# Patient Record
Sex: Male | Born: 1935 | Race: White | Hispanic: No | Marital: Married | Smoking: Never smoker
Health system: Southern US, Community
[De-identification: ages and names within clinical notes are randomized; demographics above are authoritative.]

## PROBLEM LIST (undated history)

## (undated) DIAGNOSIS — Z951 Presence of aortocoronary bypass graft: Secondary | ICD-10-CM

## (undated) DIAGNOSIS — I10 Essential (primary) hypertension: Secondary | ICD-10-CM

## (undated) DIAGNOSIS — K869 Disease of pancreas, unspecified: Secondary | ICD-10-CM

## (undated) DIAGNOSIS — Z87442 Personal history of urinary calculi: Secondary | ICD-10-CM

## (undated) DIAGNOSIS — I2510 Atherosclerotic heart disease of native coronary artery without angina pectoris: Secondary | ICD-10-CM

## (undated) DIAGNOSIS — Z972 Presence of dental prosthetic device (complete) (partial): Secondary | ICD-10-CM

## (undated) DIAGNOSIS — I739 Peripheral vascular disease, unspecified: Secondary | ICD-10-CM

## (undated) DIAGNOSIS — N209 Urinary calculus, unspecified: Secondary | ICD-10-CM

## (undated) DIAGNOSIS — M1990 Unspecified osteoarthritis, unspecified site: Secondary | ICD-10-CM

## (undated) DIAGNOSIS — Z9889 Other specified postprocedural states: Secondary | ICD-10-CM

## (undated) DIAGNOSIS — E119 Type 2 diabetes mellitus without complications: Secondary | ICD-10-CM

## (undated) DIAGNOSIS — Z8719 Personal history of other diseases of the digestive system: Secondary | ICD-10-CM

## (undated) DIAGNOSIS — D649 Anemia, unspecified: Secondary | ICD-10-CM

## (undated) DIAGNOSIS — E039 Hypothyroidism, unspecified: Secondary | ICD-10-CM

## (undated) DIAGNOSIS — N400 Enlarged prostate without lower urinary tract symptoms: Secondary | ICD-10-CM

## (undated) DIAGNOSIS — A938 Other specified arthropod-borne viral fevers: Secondary | ICD-10-CM

## (undated) DIAGNOSIS — N189 Chronic kidney disease, unspecified: Secondary | ICD-10-CM

## (undated) DIAGNOSIS — Z973 Presence of spectacles and contact lenses: Secondary | ICD-10-CM

## (undated) DIAGNOSIS — E785 Hyperlipidemia, unspecified: Secondary | ICD-10-CM

## (undated) HISTORY — DX: Presence of aortocoronary bypass graft: Z95.1

## (undated) HISTORY — DX: Hyperlipidemia, unspecified: E78.5

## (undated) HISTORY — DX: Atherosclerotic heart disease of native coronary artery without angina pectoris: I25.10

## (undated) HISTORY — PX: SHOULDER SURGERY: SHX246

## (undated) HISTORY — DX: Other specified arthropod-borne viral fevers: A93.8

## (undated) HISTORY — PX: HERNIA REPAIR: SHX51

## (undated) HISTORY — PX: INGUINAL HERNIA REPAIR: SHX194

## (undated) HISTORY — DX: Urinary calculus, unspecified: N20.9

## (undated) HISTORY — PX: CORONARY ARTERY BYPASS GRAFT: SHX141

## (undated) HISTORY — DX: Benign prostatic hyperplasia without lower urinary tract symptoms: N40.0

## (undated) HISTORY — DX: Hypothyroidism, unspecified: E03.9

## (undated) HISTORY — DX: Unspecified osteoarthritis, unspecified site: M19.90

## (undated) HISTORY — DX: Type 2 diabetes mellitus without complications: E11.9

## (undated) HISTORY — DX: Other specified postprocedural states: Z98.890

## (undated) HISTORY — PX: MULTIPLE TOOTH EXTRACTIONS: SHX2053

## (undated) HISTORY — DX: Essential (primary) hypertension: I10

## (undated) HISTORY — PX: CATARACT EXTRACTION: SUR2

## (undated) HISTORY — DX: Peripheral vascular disease, unspecified: I73.9

## (undated) HISTORY — DX: Personal history of other diseases of the digestive system: Z87.19

---

## 2001-11-04 ENCOUNTER — Encounter: Payer: Self-pay | Admitting: Cardiovascular Disease

## 2001-11-04 ENCOUNTER — Ambulatory Visit (HOSPITAL_COMMUNITY): Admission: RE | Admit: 2001-11-04 | Discharge: 2001-11-04 | Payer: Self-pay | Admitting: Cardiovascular Disease

## 2004-10-25 ENCOUNTER — Ambulatory Visit: Payer: Self-pay | Admitting: Internal Medicine

## 2005-04-01 ENCOUNTER — Ambulatory Visit (HOSPITAL_COMMUNITY): Admission: RE | Admit: 2005-04-01 | Discharge: 2005-04-01 | Payer: Self-pay | Admitting: Surgery

## 2005-08-13 ENCOUNTER — Ambulatory Visit: Payer: Self-pay | Admitting: Internal Medicine

## 2006-08-28 ENCOUNTER — Ambulatory Visit: Payer: Self-pay | Admitting: Internal Medicine

## 2007-03-17 ENCOUNTER — Ambulatory Visit: Payer: Self-pay | Admitting: Internal Medicine

## 2007-03-17 LAB — CONVERTED CEMR LAB
ALT: 30 units/L (ref 0–40)
AST: 24 units/L (ref 0–37)
Cholesterol: 122 mg/dL (ref 0–200)
HDL: 42.8 mg/dL (ref >39.0)
LDL Cholesterol: 62 mg/dL (ref 0–99)
Total CHOL/HDL Ratio: 2.9
Triglycerides: 88 mg/dL (ref 0–149)
VLDL: 18 mg/dL (ref 0–40)

## 2007-09-21 ENCOUNTER — Telehealth (INDEPENDENT_AMBULATORY_CARE_PROVIDER_SITE_OTHER): Payer: Self-pay | Admitting: *Deleted

## 2007-10-30 DIAGNOSIS — Z951 Presence of aortocoronary bypass graft: Secondary | ICD-10-CM | POA: Insufficient documentation

## 2007-10-30 DIAGNOSIS — Z9889 Other specified postprocedural states: Secondary | ICD-10-CM | POA: Insufficient documentation

## 2007-11-03 ENCOUNTER — Ambulatory Visit: Payer: Self-pay | Admitting: Internal Medicine

## 2007-11-03 DIAGNOSIS — I2510 Atherosclerotic heart disease of native coronary artery without angina pectoris: Secondary | ICD-10-CM | POA: Insufficient documentation

## 2007-11-03 DIAGNOSIS — I10 Essential (primary) hypertension: Secondary | ICD-10-CM | POA: Insufficient documentation

## 2007-11-03 DIAGNOSIS — E785 Hyperlipidemia, unspecified: Secondary | ICD-10-CM | POA: Insufficient documentation

## 2007-11-03 DIAGNOSIS — E782 Mixed hyperlipidemia: Secondary | ICD-10-CM | POA: Insufficient documentation

## 2007-11-09 ENCOUNTER — Ambulatory Visit: Payer: Self-pay | Admitting: Internal Medicine

## 2007-11-09 ENCOUNTER — Encounter (INDEPENDENT_AMBULATORY_CARE_PROVIDER_SITE_OTHER): Payer: Self-pay | Admitting: *Deleted

## 2007-11-27 ENCOUNTER — Encounter: Payer: Self-pay | Admitting: Internal Medicine

## 2008-01-07 ENCOUNTER — Ambulatory Visit: Payer: Self-pay | Admitting: Internal Medicine

## 2008-01-09 LAB — CONVERTED CEMR LAB: Hgb A1c MFr Bld: 6.5 % — ABNORMAL HIGH (ref 4.6–6.0)

## 2008-01-12 ENCOUNTER — Encounter (INDEPENDENT_AMBULATORY_CARE_PROVIDER_SITE_OTHER): Payer: Self-pay | Admitting: *Deleted

## 2008-01-13 ENCOUNTER — Ambulatory Visit: Payer: Self-pay | Admitting: Internal Medicine

## 2008-03-02 ENCOUNTER — Ambulatory Visit: Payer: Self-pay | Admitting: Internal Medicine

## 2008-03-02 LAB — CONVERTED CEMR LAB
BUN: 16 mg/dL (ref 6–23)
Creatinine, Ser: 1 mg/dL (ref 0.4–1.5)
Creatinine,U: 115.4 mg/dL
Hgb A1c MFr Bld: 6.4 % — ABNORMAL HIGH (ref 4.6–6.0)
Microalb Creat Ratio: 13 mg/g (ref 0.0–30.0)
Microalb, Ur: 1.5 mg/dL (ref 0.0–1.9)

## 2008-03-09 ENCOUNTER — Ambulatory Visit: Payer: Self-pay | Admitting: Internal Medicine

## 2008-04-28 ENCOUNTER — Encounter: Payer: Self-pay | Admitting: Internal Medicine

## 2008-07-01 ENCOUNTER — Encounter: Payer: Self-pay | Admitting: Internal Medicine

## 2008-07-04 ENCOUNTER — Ambulatory Visit: Payer: Self-pay | Admitting: Internal Medicine

## 2008-07-04 DIAGNOSIS — E1165 Type 2 diabetes mellitus with hyperglycemia: Secondary | ICD-10-CM

## 2008-07-04 DIAGNOSIS — E1151 Type 2 diabetes mellitus with diabetic peripheral angiopathy without gangrene: Secondary | ICD-10-CM | POA: Insufficient documentation

## 2008-07-04 LAB — CONVERTED CEMR LAB: Hgb A1c MFr Bld: 6.3 % — ABNORMAL HIGH (ref 4.6–6.0)

## 2008-07-08 ENCOUNTER — Ambulatory Visit: Payer: Self-pay | Admitting: Internal Medicine

## 2008-10-21 ENCOUNTER — Ambulatory Visit: Payer: Self-pay | Admitting: Cardiovascular Disease

## 2008-10-21 LAB — CONVERTED CEMR LAB
BUN: 21 mg/dL (ref 6–23)
Basophils Absolute: 0.1 10*3/uL (ref 0.0–0.1)
Basophils Relative: 1.2 % (ref 0.0–3.0)
CO2: 31 meq/L (ref 19–32)
Calcium: 9.2 mg/dL (ref 8.4–10.5)
Chloride: 104 meq/L (ref 96–112)
Creatinine, Ser: 0.7 mg/dL (ref 0.4–1.5)
Eosinophils Absolute: 0.4 10*3/uL (ref 0.0–0.7)
Eosinophils Relative: 8.2 % — ABNORMAL HIGH (ref 0.0–5.0)
GFR calc Af Amer: 143 mL/min
GFR calc non Af Amer: 118 mL/min
Glucose, Bld: 92 mg/dL (ref 70–99)
HCT: 38.4 % — ABNORMAL LOW (ref 39.0–52.0)
Hemoglobin: 13.3 g/dL (ref 13.0–17.0)
INR: 1 (ref 0.8–1.0)
Lymphocytes Relative: 42.9 % (ref 12.0–46.0)
MCHC: 34.5 g/dL (ref 30.0–36.0)
MCV: 90.6 fL (ref 78.0–100.0)
Monocytes Absolute: 0.5 10*3/uL (ref 0.1–1.0)
Monocytes Relative: 9.4 % (ref 3.0–12.0)
Neutro Abs: 2.1 10*3/uL (ref 1.4–7.7)
Neutrophils Relative %: 38.3 % — ABNORMAL LOW (ref 43.0–77.0)
Platelets: 175 10*3/uL (ref 150–400)
Potassium: 4.1 meq/L (ref 3.5–5.1)
Prothrombin Time: 11.6 s (ref 10.9–13.3)
RBC: 4.24 M/uL (ref 4.22–5.81)
RDW: 13.2 % (ref 11.5–14.6)
Sodium: 139 meq/L (ref 135–145)
WBC: 5.4 10*3/uL (ref 4.5–10.5)

## 2008-10-24 ENCOUNTER — Inpatient Hospital Stay (HOSPITAL_BASED_OUTPATIENT_CLINIC_OR_DEPARTMENT_OTHER): Admission: RE | Admit: 2008-10-24 | Discharge: 2008-10-24 | Payer: Self-pay | Admitting: Cardiology

## 2008-10-24 ENCOUNTER — Ambulatory Visit: Payer: Self-pay | Admitting: Cardiology

## 2008-11-07 ENCOUNTER — Ambulatory Visit: Payer: Self-pay | Admitting: Cardiovascular Disease

## 2008-12-22 ENCOUNTER — Encounter: Payer: Self-pay | Admitting: Internal Medicine

## 2008-12-30 ENCOUNTER — Ambulatory Visit: Payer: Self-pay | Admitting: Internal Medicine

## 2008-12-30 DIAGNOSIS — N400 Enlarged prostate without lower urinary tract symptoms: Secondary | ICD-10-CM | POA: Insufficient documentation

## 2008-12-30 LAB — CONVERTED CEMR LAB
Cholesterol, target level: 200 mg/dL
HDL goal, serum: 40 mg/dL
LDL Goal: 70 mg/dL

## 2009-01-25 ENCOUNTER — Ambulatory Visit: Payer: Self-pay | Admitting: Internal Medicine

## 2009-01-25 LAB — CONVERTED CEMR LAB
OCCULT 1: NEGATIVE
OCCULT 2: NEGATIVE
OCCULT 3: NEGATIVE

## 2009-01-26 ENCOUNTER — Encounter (INDEPENDENT_AMBULATORY_CARE_PROVIDER_SITE_OTHER): Payer: Self-pay | Admitting: *Deleted

## 2009-03-24 ENCOUNTER — Ambulatory Visit: Payer: Self-pay | Admitting: Internal Medicine

## 2009-04-02 LAB — CONVERTED CEMR LAB
ALT: 19 units/L (ref 0–53)
AST: 20 units/L (ref 0–37)
Albumin: 3.9 g/dL (ref 3.5–5.2)
Alkaline Phosphatase: 46 units/L (ref 39–117)
BUN: 20 mg/dL (ref 6–23)
Basophils Absolute: 0 10*3/uL (ref 0.0–0.1)
Basophils Relative: 0.3 % (ref 0.0–3.0)
Bilirubin, Direct: 0 mg/dL (ref 0.0–0.3)
Cholesterol: 114 mg/dL (ref 0–200)
Creatinine, Ser: 1 mg/dL (ref 0.4–1.5)
Creatinine,U: 104.9 mg/dL
Eosinophils Absolute: 0.5 10*3/uL (ref 0.0–0.7)
Eosinophils Relative: 9 % — ABNORMAL HIGH (ref 0.0–5.0)
HCT: 39.2 % (ref 39.0–52.0)
HDL: 40.7 mg/dL (ref >39.00)
Hemoglobin: 13.2 g/dL (ref 13.0–17.0)
Hgb A1c MFr Bld: 6.3 % (ref 4.6–6.5)
LDL Cholesterol: 62 mg/dL (ref 0–99)
Lymphocytes Relative: 39.7 % (ref 12.0–46.0)
Lymphs Abs: 2 10*3/uL (ref 0.7–4.0)
MCHC: 33.7 g/dL (ref 30.0–36.0)
MCV: 91.3 fL (ref 78.0–100.0)
Microalb Creat Ratio: 7.6 mg/g (ref 0.0–30.0)
Microalb, Ur: 0.8 mg/dL (ref 0.0–1.9)
Monocytes Absolute: 0.5 10*3/uL (ref 0.1–1.0)
Monocytes Relative: 9.3 % (ref 3.0–12.0)
Neutro Abs: 2 10*3/uL (ref 1.4–7.7)
Neutrophils Relative %: 41.7 % — ABNORMAL LOW (ref 43.0–77.0)
PSA: 1.06 ng/mL (ref 0.10–4.00)
Platelets: 167 10*3/uL (ref 150.0–400.0)
Potassium: 4.6 meq/L (ref 3.5–5.1)
RBC: 4.29 M/uL (ref 4.22–5.81)
RDW: 13.7 % (ref 11.5–14.6)
Total Bilirubin: 0.7 mg/dL (ref 0.3–1.2)
Total CHOL/HDL Ratio: 3
Total Protein: 6.4 g/dL (ref 6.0–8.3)
Triglycerides: 58 mg/dL (ref 0.0–149.0)
VLDL: 11.6 mg/dL (ref 0.0–40.0)
WBC: 5 10*3/uL (ref 4.5–10.5)

## 2009-04-04 ENCOUNTER — Encounter (INDEPENDENT_AMBULATORY_CARE_PROVIDER_SITE_OTHER): Payer: Self-pay | Admitting: *Deleted

## 2009-04-06 ENCOUNTER — Ambulatory Visit: Payer: Self-pay | Admitting: Internal Medicine

## 2009-05-02 DIAGNOSIS — I498 Other specified cardiac arrhythmias: Secondary | ICD-10-CM | POA: Insufficient documentation

## 2009-05-02 DIAGNOSIS — I739 Peripheral vascular disease, unspecified: Secondary | ICD-10-CM | POA: Insufficient documentation

## 2009-05-03 ENCOUNTER — Ambulatory Visit: Payer: Self-pay | Admitting: Cardiovascular Disease

## 2009-06-29 ENCOUNTER — Telehealth: Payer: Self-pay | Admitting: Internal Medicine

## 2009-10-02 ENCOUNTER — Ambulatory Visit: Payer: Self-pay | Admitting: Internal Medicine

## 2009-10-03 ENCOUNTER — Encounter (INDEPENDENT_AMBULATORY_CARE_PROVIDER_SITE_OTHER): Payer: Self-pay | Admitting: *Deleted

## 2009-10-03 LAB — CONVERTED CEMR LAB: Hgb A1c MFr Bld: 6.3 % (ref 4.6–6.5)

## 2009-10-09 ENCOUNTER — Ambulatory Visit: Payer: Self-pay | Admitting: Internal Medicine

## 2009-10-26 ENCOUNTER — Ambulatory Visit: Payer: Self-pay | Admitting: Cardiovascular Disease

## 2010-01-02 ENCOUNTER — Encounter: Payer: Self-pay | Admitting: Internal Medicine

## 2010-04-10 ENCOUNTER — Telehealth (INDEPENDENT_AMBULATORY_CARE_PROVIDER_SITE_OTHER): Payer: Self-pay | Admitting: *Deleted

## 2010-05-02 ENCOUNTER — Ambulatory Visit: Payer: Self-pay | Admitting: Internal Medicine

## 2010-05-07 LAB — CONVERTED CEMR LAB
ALT: 26 units/L (ref 0–53)
AST: 24 units/L (ref 0–37)
Albumin: 4.3 g/dL (ref 3.5–5.2)
Alkaline Phosphatase: 48 units/L (ref 39–117)
BUN: 16 mg/dL (ref 6–23)
Bilirubin, Direct: 0.1 mg/dL (ref 0.0–0.3)
Cholesterol: 117 mg/dL (ref 0–200)
Creatinine, Ser: 1 mg/dL (ref 0.4–1.5)
Creatinine,U: 108.6 mg/dL
HDL: 43.4 mg/dL (ref >39.00)
Hgb A1c MFr Bld: 6.3 % (ref 4.6–6.5)
LDL Cholesterol: 65 mg/dL (ref 0–99)
Microalb Creat Ratio: 0.6 mg/g (ref 0.0–30.0)
Microalb, Ur: 0.6 mg/dL (ref 0.0–1.9)
PSA: 1.19 ng/mL (ref 0.10–4.00)
Potassium: 4.7 meq/L (ref 3.5–5.1)
Total Bilirubin: 0.7 mg/dL (ref 0.3–1.2)
Total CHOL/HDL Ratio: 3
Total Protein: 6.5 g/dL (ref 6.0–8.3)
Triglycerides: 45 mg/dL (ref 0.0–149.0)
VLDL: 9 mg/dL (ref 0.0–40.0)

## 2010-05-10 ENCOUNTER — Ambulatory Visit: Payer: Self-pay | Admitting: Internal Medicine

## 2010-05-10 DIAGNOSIS — M25519 Pain in unspecified shoulder: Secondary | ICD-10-CM | POA: Insufficient documentation

## 2010-08-10 ENCOUNTER — Ambulatory Visit: Payer: Self-pay | Admitting: Internal Medicine

## 2010-08-13 LAB — CONVERTED CEMR LAB: Hgb A1c MFr Bld: 7 % — ABNORMAL HIGH (ref 4.6–6.5)

## 2010-09-23 ENCOUNTER — Encounter: Payer: Self-pay | Admitting: Internal Medicine

## 2010-10-03 ENCOUNTER — Telehealth: Payer: Self-pay | Admitting: Internal Medicine

## 2010-10-03 DIAGNOSIS — K7689 Other specified diseases of liver: Secondary | ICD-10-CM | POA: Insufficient documentation

## 2010-10-05 ENCOUNTER — Encounter: Payer: Self-pay | Admitting: Internal Medicine

## 2010-10-08 ENCOUNTER — Encounter: Admission: RE | Admit: 2010-10-08 | Discharge: 2010-10-08 | Payer: Self-pay | Admitting: Internal Medicine

## 2010-10-29 ENCOUNTER — Ambulatory Visit: Payer: Self-pay | Admitting: Cardiovascular Disease

## 2010-12-16 DIAGNOSIS — A938 Other specified arthropod-borne viral fevers: Secondary | ICD-10-CM

## 2010-12-16 HISTORY — DX: Other specified arthropod-borne viral fevers: A93.8

## 2011-01-13 LAB — CONVERTED CEMR LAB
ALT: 21 units/L (ref 0–53)
AST: 20 units/L (ref 0–37)
Albumin: 4 g/dL (ref 3.5–5.2)
Alkaline Phosphatase: 55 units/L (ref 39–117)
BUN: 11 mg/dL (ref 6–23)
Basophils Absolute: 0.1 10*3/uL (ref 0.0–0.1)
Basophils Relative: 1 % (ref 0.0–1.0)
Bilirubin, Direct: 0.1 mg/dL (ref 0.0–0.3)
CO2: 29 meq/L (ref 19–32)
Calcium: 9.3 mg/dL (ref 8.4–10.5)
Chloride: 103 meq/L (ref 96–112)
Cholesterol: 113 mg/dL (ref 0–200)
Creatinine, Ser: 1 mg/dL (ref 0.4–1.5)
Eosinophils Absolute: 0.4 10*3/uL (ref 0.0–0.6)
Eosinophils Relative: 5.9 % — ABNORMAL HIGH (ref 0.0–5.0)
GFR calc Af Amer: 95 mL/min
GFR calc non Af Amer: 78 mL/min
Glucose, Bld: 108 mg/dL — ABNORMAL HIGH (ref 70–99)
HCT: 41.2 % (ref 39.0–52.0)
HDL: 40 mg/dL (ref >39.0)
Hemoglobin: 13.9 g/dL (ref 13.0–17.0)
Hgb A1c MFr Bld: 6.5 % — ABNORMAL HIGH (ref 4.6–6.0)
LDL Cholesterol: 55 mg/dL (ref 0–99)
Lymphocytes Relative: 28.7 % (ref 12.0–46.0)
MCHC: 33.8 g/dL (ref 30.0–36.0)
MCV: 91 fL (ref 78.0–100.0)
Monocytes Absolute: 0.7 10*3/uL (ref 0.2–0.7)
Monocytes Relative: 9.1 % (ref 3.0–11.0)
Neutro Abs: 4.1 10*3/uL (ref 1.4–7.7)
Neutrophils Relative %: 55.3 % (ref 43.0–77.0)
PSA: 1.13 ng/mL (ref 0.10–4.00)
Platelets: 264 10*3/uL (ref 150–400)
Potassium: 4.3 meq/L (ref 3.5–5.1)
RBC: 4.53 M/uL (ref 4.22–5.81)
RDW: 13 % (ref 11.5–14.6)
Sodium: 140 meq/L (ref 135–145)
TSH: 3.18 microintl units/mL (ref 0.35–5.50)
Total Bilirubin: 0.7 mg/dL (ref 0.3–1.2)
Total CHOL/HDL Ratio: 2.8
Total Protein: 7 g/dL (ref 6.0–8.3)
Triglycerides: 89 mg/dL (ref 0–149)
VLDL: 18 mg/dL (ref 0–40)
WBC: 7.4 10*3/uL (ref 4.5–10.5)

## 2011-01-17 NOTE — Letter (Signed)
Summary: Results Follow-up Letter  [facility]   [place], [place] [address]   Phone: [phone]  Fax: [phone]    04/04/2009        Ethan Mcneil. [place], [place]  [address]  Dear Ethan Mcneil,   The following are the results of your recent test(s):  Test     Result     Pap Smear    Normal_______  Not Normal_____       Comments: _________________________________________________________ Cholesterol LDL(Bad cholesterol):          Your goal is less than:         HDL (Good cholesterol):        Your goal is more than: _________________________________________________________ Other Tests:   _________________________________________________________  Please call for an appointment Or ___Please see attached labwork.______________________________________________________ _________________________________________________________ _________________________________________________________  Sincerely,  Carley Hammed  at [facility]

## 2011-01-17 NOTE — Assessment & Plan Note (Signed)
Summary: f59m/dm   CC:  6 month follow up.  History of Present Illness: Ethan Mcneil is seen today in followup for his hypertension hypercholesterolemia and coronary artery disease.  Her current bypass surgery in 1997.  His last heart catheterization in November of 2009 showed pristine grafts.  Not having chest pain.  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  he was nice enough to bring me a picture of him and his old tracing days.  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 he is not having any chest pain PND orthopnea palpitations or syncope.  I reviewed all of his lab work from April.  His LDL cholesterol is only 64.  LFTs were normal.  His hemoglobin A1c was 6.3.  He does try to watch his diet.  His fasting sugars tend to run 120 to 1:30.  I asked him to ask Dr. Lenna Gilford about the  possibility of adding Januvia to his regimen.  Current Problems (verified): 1)  Bradycardia  (ICD-427.89) 2)  Chest Pain-unspecified  (ICD-786.50) 3)  Pvd  (ICD-443.9) 4)  Hyperplasia Prostate Uns w/o Ur Obst & Oth Luts  (ICD-600.90) 5)  Diabetes Mellitus, Type II  (ICD-250.00) 6)  Hypertension, Essential Nos  (ICD-401.9) 7)  Hyperlipidemia  (ICD-272.2) 8)  C A D  (ICD-414.00) 9)  Herniorrhaphy, Hx of  (ICD-V45.89) 10)  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 1/2 Tab Two Times A Day 2)  Metformin Hcl 500 Mg  Tb24 (Metformin Hcl) .Marland Kitchen.. 1 Two Times A Day With 2 Largest Meals 3)  Glimepiride 2 Mg  Tabs (Glimepiride) .... 1/2 By Mouth Once Daily 4)  Simvastatin 40 Mg Tabs (Simvastatin) .Marland Kitchen.. 1 At Bedtime 5)  Multivitamins   Tabs (Multiple Vitamin) .Marland Kitchen.. 1 Tab By Mouth Once Daily 6)  Aspirin 81 Mg  Tabs (Aspirin) .Marland Kitchen.. 1 Tab By Mouth Two Times A Day  Allergies (verified): 1)  ! Codeine  Past History:  Past Medical History:    CHEST  PAIN-UNSPECIFIED (ICD-786.50)    PVD (ICD-443.9)    HYPERPLASIA PROSTATE UNS W/O UR OBST & OTH LUTS (ICD-600.90)    DIABETES MELLITUS, TYPE II (ICD-250.00)    HYPERTENSION, ESSENTIAL NOS (ICD-401.9)    HYPERLIPIDEMIA (ICD-272.2)    C A D (ICD-414.00) CABG 27' grafts patent cath [facility] 10/2008    HERNIORRHAPHY, HX OF (ICD-V45.89)    POSTSURGICAL AORTOCORONARY BYPASS STATUS (ICD-V45.81)      (05/02/2009)  Past Surgical History:    Coronary artery bypass graft x7 vessels 1997 ; cath 2002; cath 2009 : grafts patent    Shoulder surgery    Colonoscopy 2002    4/06-Inguinal herniorrhaphy     (04/06/2009)  Family History:    Father: lung CA    Mother:CAD/ stent    Siblings: bro DM     (12/30/2008)  Social History:    low fat    Occupation:Mechanic    Never Smoked    Alcohol use-no    Regular exercise-no but walks    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 All other systems reviewed and negative  Vital Signs:  Patient profile:   77  year old male Height:      70 inches Weight:      191 pounds BMI:     27.50 Pulse rate:   57 / minute Resp:     14 per minute BP sitting:   134 / 74  (left arm)  Vitals Entered By: Ethan Mcneil (May 03, 2009 3:06 PM)   Impression & Recommendations:  Problem # 1:  C A D (ICD-414.00) CABG 1997 Patent grafts cath 10/2008.  Medical therapy His updated medication list for this problem includes:    Metoprolol Tartrate 50 Mg Tabs (Metoprolol tartrate) .Marland Kitchen... 1/2 tab two times a day    Aspirin 81 Mg Tabs (Aspirin) .Marland Kitchen... 1 tab by mouth two times a day  Problem # 2:  HYPERTENSION, ESSENTIAL NOS (ICD-401.9) Well controlled low sodium diet His updated medication list for this problem includes:    Metoprolol  Tartrate 50 Mg Tabs (Metoprolol tartrate) .Marland Kitchen... 1/2 tab two times a day    Aspirin 81 Mg Tabs (Aspirin) .Marland Kitchen... 1 tab by mouth two times a day  Problem # 3:  DIABETES MELLITUS, TYPE II (ICD-250.00) Fairly good control, not overweight.  Consider adding Januvia if target HbA1c is < 6.0 His updated medication list for this problem includes:    Metformin Hcl 500 Mg Tb24 (Metformin hcl) .Marland Kitchen... 1 two times a day with 2 largest meals    Glimepiride 2 Mg Tabs (Glimepiride) .Marland Kitchen... 1/2 by mouth once daily    Aspirin 81 Mg Tabs (Aspirin) .Marland Kitchen... 1 tab by mouth two times a day  Problem # 4:  HYPERLIPIDEMIA (ICD-272.2)  LDL at goal with no side effects His updated medication list for this problem includes:    Simvastatin 40 Mg Tabs (Simvastatin) .Marland Kitchen... 1 at bedtime  CHOL: 114 (03/24/2009)   LDL: 62 (03/24/2009)   HDL: 40.70 (03/24/2009)   TG: 58.0 (03/24/2009) CHOL (goal): 200 (12/30/2008)   LDL (goal): 70 (12/30/2008)   HDL (goal): 40 (12/30/2008)   TG (goal): 150 (12/30/2008)  Patient Instructions: 1)  F/U Dr. Johnsie Cancel 6 months

## 2011-01-17 NOTE — Progress Notes (Signed)
Summary: POTENTIAL MEDICATION NONCOMPLIANCE  Phone Note Refill Request Message from:  Pharmacy on [employer]  Refills Requested: Medication #1:  GLIMEPIRIDE 2 MG  TABS 1/2 by mouth once daily OUR AVAILABLE PRESCRIPTION PRESCRIPTION CLAIMS SUGGUEST THAT YOUR PATIENT MAY POTENTIALLY BE NONCOMPLIANT WITH GLIMEPIRIDE 2MG . A 45 DAY SUPPLY WAS LAST FILLED ON 04/12/2009.  Initial call taken by: Silva Bandy,  June 29, 2009 2:37 PM  Follow-up for Phone Call        noted Follow-up by: Unice Cobble MD,  June 30, 2009 11:40 AM

## 2011-01-17 NOTE — Letter (Signed)
Summary: [name]  [name]   Imported By: Velora Heckler 12/07/2007 15:01:39  _____________________________________________________________________  External Attachment:    Type:   Image     Comment:   External Document

## 2011-01-17 NOTE — Progress Notes (Signed)
Summary: Refill Request  Phone Note Refill Request Call back at [phone] Message from:  Pharmacy on April 10, 2010 11:11 AM  Refills Requested: Medication #1:  SIMVASTATIN 40 MG TABS 1 at bedtime   Dosage confirmed as above?Dosage Confirmed   Supply Requested: 3 months   Notes: 3 refills [facility]  Next Appointment Scheduled: 5.26.11 Initial call taken by: Elna Breslow,  April 10, 2010 11:11 AM    New/Updated Medications: SIMVASTATIN 40 MG TABS (SIMVASTATIN) 1 at bedtime Prescriptions: SIMVASTATIN 40 MG TABS (SIMVASTATIN) 1 at bedtime  #90 x 0   Entered by:   Georgette Dover   Authorized by:   Unice Cobble MD   Signed by:   Georgette Dover on 04/10/2010   Method used:   Faxed to ...       [facility] (mail-order)             ,          Ph: [phone]       Fax: [phone]   RxID:   [id number]

## 2011-01-17 NOTE — Assessment & Plan Note (Signed)
Summary: roa review lab.cbs   Vital Signs:  Patient Profile:   77 Years Old Male Weight:      198.6 pounds Pulse rate:   64 / minute Resp:     16 per minute BP sitting:   128 / 70  (left arm) Cuff size:   large  Pt. in pain?   no  Vitals Entered By: Georgette Dover (January 13, 2008 10:21 AM)                  Chief Complaint:  REVIEW LABS-PATIENT GIVEN COPY and Type 2 diabetes mellitus follow-up.  History of Present Illness:  Type 2 Diabetes Mellitus Follow-Up      This is a 77 year old man who presents for Type 2 diabetes mellitus follow-up.  On low carb diet , "mine". Decreased sweets with 5 # loss. Bro has DM,  not overweight.  The patient reports weight loss, but denies polyuria, polydipsia, blurred vision, self managed hypoglycemia, hypoglycemia requiring help, weight gain, and numbness of extremities.  The patient denies the following symptoms: neuropathic pain, chest pain, vomiting, orthostatic symptoms, poor wound healing, intermittent claudication, vision loss, and foot ulcer.  Since the last visit the patient reports good dietary compliance, compliance with medications, exercising regularly, and not monitoring blood glucose.  Since the last visit, the patient reports having had no eye care and no foot care.    Current Allergies: ! CODEINE      Physical Exam  General:     Well-developed,well-nourished,in no acute distress; alert,appropriate and cooperative throughout examination Heart:     Split S1 vs S4;normal rate and regular rhythm.   Pulses:     R and L carotid,radial and posterior tibial pulses are full and equal bilaterally. Decreased DPP. Extremities:     trace left pedal edema.  Excellent nail health Skin:     Intact without suspicious lesions or rashes    Impression & Recommendations:  Problem # 1:  DIABETES MELLITUS, TYPE II, UNCONTROLLED (ICD-250.02)  His updated medication list for this problem includes:    Metformin Hcl 500 Mg Tb24 (Metformin  hcl) .Marland Kitchen... 1 two times a day with 2 largest meals    Glimepiride 2 Mg Tabs (Glimepiride) .Marland Kitchen... 1/2 qd   Problem # 2:  C A D (ICD-414.00)  His updated medication list for this problem includes:    Metoprolol Tartrate 50 Mg Tabs (Metoprolol tartrate) .Marland Kitchen... 1/2 tab bid   Complete Medication List: 1)  Metoprolol Tartrate 50 Mg Tabs (Metoprolol tartrate) .... 1/2 tab bid 2)  Vytorin 10-20 Mg Tabs (Ezetimibe-simvastatin) .Marland Kitchen.. 1 by mouth qd 3)  Metformin Hcl 500 Mg Tb24 (Metformin hcl) .Marland Kitchen.. 1 two times a day with 2 largest meals 4)  Glimepiride 2 Mg Tabs (Glimepiride) .... 1/2 qd   Patient Instructions: 1)  Please schedule a follow-up appointment in 2 months. 2)  BUN,creat, K+ prior to visit, ICD-9: 995.20 3)  HbgA1C prior to visit, ICD-9: 250.02 4)  Urine Microalbumin prior to visit, ICD-9:250.02. Continue low carb diet, NOT ATKIN'S. See GOAL SHEET    Prescriptions: GLIMEPIRIDE 2 MG  TABS (GLIMEPIRIDE) 1/2 qd  #90 x 0   Entered and Authorized by:   Unice Cobble MD   Signed by:   Unice Cobble MD on 01/13/2008   Method used:   Print then Give to Patient   RxID:   [id number] METFORMIN HCL 500 MG  TB24 (METFORMIN HCL) 1 two times a day with 2 largest meals  #180 x  1   Entered and Authorized by:   Unice Cobble MD   Signed by:   Unice Cobble MD on 01/13/2008   Method used:   Print then Give to Patient   RxID:   [id number]  ]

## 2011-01-17 NOTE — Assessment & Plan Note (Signed)
Summary: roa 2 months review lab.cbs   Vital Signs:  Patient Profile:   77 Years Old Male Weight:      196 pounds Pulse rate:   60 / minute Pulse rhythm:   regular Resp:     14 per minute BP sitting:   120 / 72  (left arm) Cuff size:   large  Pt. in pain?   no  Vitals Entered By: Janelle Floor (March 09, 2008 10:34 AM)                  Chief Complaint:  follow up labs and Type 2 diabetes mellitus follow-up.  History of Present Illness:  Type 2 Diabetes Mellitus Follow-Up      This is a 77 year old man who presents for Type 2 diabetes mellitus follow-up.  FBS 107-116; pc< 183. Wt loss of 8# "off sweets".  The patient reports weight loss, but denies polyuria, polydipsia, blurred vision, self managed hypoglycemia, hypoglycemia requiring help, weight gain, and numbness of extremities.  Since the last visit the patient reports good dietary compliance, compliance with medications, exercising regularly, and monitoring blood glucose.  The patient has been measuring capillary blood glucose before breakfast and after dinner.  Since the last visit, the patient reports having had no eye care and no foot care.  Complications from diabetes include ASCVD.  Last Cardiology evaluation 2004.     Current Allergies (reviewed today): ! CODEINE  Past Medical History:    Reviewed history from 11/03/2007 and no changes required:       Hyperlipidemia       Hypertension       Current Problems:        DIABETES MELLITUS, TYPE II, UNCONTROLLED (ICD-250.02)       OTHER ABNORMAL BLOOD CHEMISTRY (ICD-790.6)       HYPERPLASIA PROSTATE UNS W/O UR OBST & OTH LUTS (ICD-600.90)       HYPERTENSION, ESSENTIAL NOS (ICD-401.9)       HYPERLIPIDEMIA (ICD-272.2)       C A D (ICD-414.00)       HYPERTENSION (ICD-401.9)       HYPERLIPIDEMIA (ICD-272.4)       ROUTINE GENERAL MEDICAL EXAM@HEALTH  CARE FACL (ICD-V70.0)       HERNIORRHAPHY, HX OF (ICD-V45.89)       POSTSURGICAL AORTOCORONARY BYPASS STATUS  (ICD-V45.81)       CORONARY ATHEROSCLEROSIS NATIVE CORONARY ARTERY (ICD-414.01)  Past Surgical History:    Reviewed history from 11/03/2007 and no changes required:       Coronary artery bypass graft x7 vessels 1997 ; cath 2002       Shoulder surgery       Colonoscopy 2002       Inguinal herniorrhaphy   Family History:    Reviewed history from 11/03/2007 and no changes required:       Father: lung CA       Mother: stent       Siblings: bro DM  Social History:    Reviewed history from 11/03/2007 and no changes required:       low fat     Physical Exam  General:     Well-developed,well-nourished,in no acute distress; alert,appropriate and cooperative throughout examination; having some classic LBS symptoms Lungs:     Normal respiratory effort, chest expands symmetrically. Lungs are clear to auscultation, no crackles or wheezes. Heart:     Normal rate and regular rhythm. S1 and S2 normal without gallop, murmur, click, rub. S4 with  slurring. Abdomen:     Bowel sounds positive,abdomen soft and non-tender without masses, organomegaly or hernias noted. Msk:     Classic LBS gait, mobilization. Pulses:     R and L carotid,radial,femoral  pulses are full and equal bilaterally. Decreased pedal pulses. Extremities:     Neg SLR    Impression & Recommendations:  Problem # 1:  DIAB W/O COMP TYPE II/UNS NOT STATED UNCNTRL (ICD-250.00)  His updated medication list for this problem includes:    Metformin Hcl 500 Mg Tb24 (Metformin hcl) .Marland Kitchen... 1 two times a day with 2 largest meals    Glimepiride 2 Mg Tabs (Glimepiride) .Marland Kitchen... 1/2 qd  Orders: LE Arterial Doppler/ABI (LE arterial doppler)   Problem # 2:  PERIPHERAL VASCULAR INSUFFICIENCY,  LEGS, BILATERAL (ICD-443.9)  Orders: LE Arterial Doppler/ABI (LE arterial doppler)   Complete Medication List: 1)  Metoprolol Tartrate 50 Mg Tabs (Metoprolol tartrate) .... 1/2 tab bid 2)  Vytorin 10-20 Mg Tabs (Ezetimibe-simvastatin) .Marland Kitchen..  1 by mouth qd 3)  Metformin Hcl 500 Mg Tb24 (Metformin hcl) .Marland Kitchen.. 1 two times a day with 2 largest meals 4)  Glimepiride 2 Mg Tabs (Glimepiride) .... 1/2 qd   Patient Instructions: 1)  Check your blood sugars regularly. If your readings are usually above :130 or below 70 you should contact our office. 2)  See your eye doctor yearly to check for diabetic eye damage. 3)  Check your feet each night for sore areas, calluses or signs of infection. 4)  Please schedule a follow-up appointment in 4 months. 5)  HbgA1C prior to visit, ICD-9:2250.00    Prescriptions: VYTORIN 10-20 MG  TABS (EZETIMIBE-SIMVASTATIN) 1 by mouth qd  #90 x 3   Entered and Authorized by:   Unice Cobble MD   Signed by:   Unice Cobble MD on 03/09/2008   Method used:   Print then Give to Patient   RxID:   [id number]  ]

## 2011-01-17 NOTE — Letter (Signed)
Summary: Results Follow up Letter  [facility]   [place], [place] [address]   Phone: [phone]  Fax: [phone]    10/03/2009 MRN: [id number]  [facility] [address] [place], [place]  [address]  Dear Mr. FEARRINGTON,  The following are the results of your recent test(s):  Test         Result    Pap Smear:        Normal _____  Not Normal _____ Comments: ______________________________________________________ Cholesterol: LDL(Bad cholesterol):         Your goal is less than:         HDL (Good cholesterol):       Your goal is more than: Comments:  ______________________________________________________ Mammogram:        Normal _____  Not Normal _____ Comments:  ___________________________________________________________________ Hemoccult:        Normal _____  Not normal _______ Comments:    _____________________________________________________________________ Other Tests: Please see attached labs done on 10/02/2009    We routinely do not discuss normal results over the telephone.  If you desire a copy of the results, or you have any questions about this information we can discuss them at your next office visit.   Sincerely,

## 2011-01-17 NOTE — Progress Notes (Signed)
     New Problems: HEPATIC CYST (ICD-573.8)   New Problems: HEPATIC CYST (ICD-573.8)

## 2011-01-17 NOTE — Assessment & Plan Note (Signed)
Summary: 6 month//fd   Vital Signs:  Patient profile:   77 year old male Weight:      188.65 pounds Pulse rate:   58 / minute Resp:     16 per minute BP sitting:   120 / 68  (left arm) Cuff size:   large  Vitals Entered By: Georgette Dover (October 09, 2009 8:07 AM) CC: 6 Month Follow-Up on labs-patient received mailed copy Comments REVIEWED MED LIST, PATIENT AGREED DOSE AND INSTRUCTION CORRECT    CC:  6 Month Follow-Up on labs-patient received mailed copy.  History of Present Illness: FBS 95-120; no 2 hr post meal glucoses. No hypoglycemia; on heart healthy diet & CVE as walking 2 mpd .Cath done 10/24/2008  for exertional chest pressure was negative(report reviewed : "pristine" vessels for age as CABG done 1997)  by Dr Olevia Perches.Dr Johnsie Cancel 's 04/2009 OV reviewed ; addition of Januvia questioned ; A1c unchanged since 04/10 & lipids @ goal.No symptoms with less aggressive hill climbing.  Allergies: 1)  ! Codeine  Review of Systems General:  Denies fatigue and weight loss. Eyes:  Denies blurring, double vision, and vision loss-both eyes; Last exam 1 year ago; no retinopathy. CV:  See HPI; Denies chest pain or discomfort, leg cramps with exertion, lightheadness, near fainting, shortness of breath with exertion, swelling of feet, and swelling of hands. Resp:  Denies cough and shortness of breath. GU:  Complains of nocturia; denies urinary frequency and urinary hesitancy; Nocturia X2. Derm:  Denies changes in nail beds, dryness, hair loss, and poor wound healing. Neuro:  Denies numbness and tingling; No burning in feet. Endo:  Denies excessive hunger, excessive thirst, excessive urination, polyuria, and weight change.  Physical Exam  General:  Appears younger than age,well-nourished,in no acute distress; alert,appropriate and cooperative throughout examination Lungs:  Normal respiratory effort, chest expands symmetrically. Lungs are clear to auscultation, no crackles or wheezes. Heart:   regular rhythm and bradycardia.  S4 Abdomen:  Bowel sounds positive,abdomen soft and non-tender without masses, organomegaly or hernias noted. Pulses:  R and L carotid,radial and posterior tibial pulses are full and equal bilaterally. Decreased DPPw/o ischemic changes Extremities:  No clubbing, cyanosis, edema. Good nail health Neurologic:  alert & oriented X3, sensation intact to light touch and sensation intact to pinprick over feet.   Skin:  Intact without suspicious lesions or rashes Psych:  memory intact for recent and remote, normally interactive, and good eye contact.     Impression & Recommendations:  Problem # 1:  DIABETES MELLITUS, TYPE II (ICD-250.00) Stable & controlled His updated medication list for this problem includes:    Metformin Hcl 500 Mg Tb24 (Metformin hcl) .Marland Kitchen... 1 two times a day with 2 largest meals    Glimepiride 2 Mg Tabs (Glimepiride) .Marland Kitchen... 1/2 by mouth once daily    Aspirin 81 Mg Tabs (Aspirin) .Marland Kitchen... 1 tab by mouth two times a day  Problem # 2:  CHEST PAIN-UNSPECIFIED (ICD-786.50) Resolved  Problem # 3:  HYPERTENSION, ESSENTIAL NOS (ICD-401.9) Controlled His updated medication list for this problem includes:    Metoprolol Tartrate 50 Mg Tabs (Metoprolol tartrate) .Marland Kitchen... 1/2 tab two times a day  Problem # 4:  HYPERLIPIDEMIA (ICD-272.2) @ goal His updated medication list for this problem includes:    Simvastatin 40 Mg Tabs (Simvastatin) .Marland Kitchen... 1 at bedtime  Complete Medication List: 1)  Metoprolol Tartrate 50 Mg Tabs (Metoprolol tartrate) .... 1/2 tab two times a day 2)  Metformin Hcl 500 Mg Tb24 (Metformin hcl) .Marland KitchenMarland KitchenMarland Kitchen  1 two times a day with 2 largest meals 3)  Glimepiride 2 Mg Tabs (Glimepiride) .... 1/2 by mouth once daily 4)  Simvastatin 40 Mg Tabs (Simvastatin) .Marland Kitchen.. 1 at bedtime 5)  Multivitamins Tabs (Multiple vitamin) .Marland Kitchen.. 1 tab by mouth once daily 6)  Aspirin 81 Mg Tabs (Aspirin) .Marland Kitchen.. 1 tab by mouth two times a day  Patient Instructions: 1)   Please schedule a follow-up appointment in 6 months. 2)  Check your blood sugars regularly. If your readings are usually above :130 or below 70 you should contact our office. 3)  See your eye doctor yearly to check for diabetic eye damage. 4)  Check your Blood Pressure regularly. If it is above:130/85 on AVERAGE  you should make an appointment. 5)  BUN,creat, K+ prior to visit, ICD-9:401.9 6)  Hepatic Panel prior to visit, ICD-9:995.20 7)  Lipid Panel prior to visit, ICD-9:272.4 8)  PSA prior to visit, ICD-9:600.9 9)  HbgA1C prior to visit, ICD-9:250.00 10)  Urine Microalbumin prior to visit, ICD-9:250.00. Diabetes meds will not be changed unless hypoglycemia occurs or A1c rises > 6.5%.  Appended Document: Immunization Entry     Flu Vaccine Consent Questions     Do you have a history of severe allergic reactions to this vaccine? no    Any prior history of allergic reactions to egg and/or gelatin? no    Do you have a sensitivity to the preservative Thimersol? no    Do you have a past history of Guillan-Barre Syndrome? no    Do you currently have an acute febrile illness? no    Have you ever had a severe reaction to latex? no    Vaccine information given and explained to patient? yes    Are you currently pregnant? no    Lot Number:[id number]   Exp Date:06/14/2010   Site Given  Right Deltoid IM

## 2011-01-17 NOTE — Assessment & Plan Note (Signed)
Summary: yearly check & lab/cbs   Vital Signs:  Patient Profile:   77 Years Old Male Height:     70 inches Weight:      189.9 pounds Temp:     97.9 degrees F oral Pulse rate:   70 / minute Resp:     14 per minute BP sitting:   140 / 72 Cuff size:   large  Vitals Entered By: Georgette Dover (December 30, 2008 8:31 AM)                 Chief Complaint:  CPX WITH FASTING LABS-SEEN CARDIOLOGIST 10/21/2008 (EKG DONE) and Type 2 diabetes mellitus follow-up.  History of Present Illness: Dr Kyla Balzarine evaluation reviewed; cath 10/24/2008 : excellent revascularization EXCEPT small marginal vessel. Serial labs reviewed; HCT 38.4 in 11/09. A1c & lipids are due.   Type 2 Diabetes Mellitus Follow-Up      This is a 77 year old man who presents for Type 2 diabetes mellitus follow-up.  FBS 115-130;  2 hr pc not checked.  The patient denies polyuria, polydipsia, blurred vision, self managed hypoglycemia, hypoglycemia requiring help, weight loss, weight gain, and numbness of extremities.  The patient denies the following symptoms: neuropathic pain, chest pain, vomiting, orthostatic symptoms, poor wound healing, intermittent claudication, vision loss, and foot ulcer.  Since the last visit the patient reports good dietary compliance, compliance with medications, not exercising regularly, and monitoring blood glucose.  The patient has been measuring capillary blood glucose before breakfast.  Since the last visit, the patient reports having had no eye care, laser photocoagulation, and no foot care.  Complications from diabetes include ASCVD.  See Lipid Management ; he wants to change Vytorin due to cost.   Hypertension History:      He denies headache, chest pain, palpitations, dyspnea with exertion, orthopnea, PND, peripheral edema, visual symptoms, neurologic problems, syncope, and side effects from treatment.  He notes no problems with any antihypertensive medication side effects.  Further comments include: BP  not  checked. Pressure was only with walking up hill; decreased CVE with cold weather. Only on Metoprolol 50 mg 1/2 once daily as per Dr Mordecai Rasmussen (SEE BP  140/72).        Positive major cardiovascular risk factors include male age 52 years old or older, diabetes, hyperlipidemia, and hypertension.  Negative major cardiovascular risk factors include negative family history for ischemic heart disease and non-tobacco-user status.        Positive history for target organ damage include ASHD (either angina; prior MI; prior CABG).  Further assessment for target organ damage reveals no history of stroke/TIA or peripheral vascular disease.    Lipid Management History:      Positive NCEP/ATP III risk factors include male age 55 years old or older, diabetes, hypertension, and ASHD (atherosclerotic heart disease).  Negative NCEP/ATP III risk factors include no family history for ischemic heart disease, non-tobacco-user status, no prior stroke/TIA, no peripheral vascular disease, and no history of aortic aneurysm.        Current Allergies: ! CODEINE  Past Medical History:    Reviewed history from 07/04/2008 and no changes required:       Hyperlipidemia       Hypertension       CAD       PVD       Diabetes mellitus, type II  Past Surgical History:    Coronary artery bypass graft x7 vessels 1997 ; cath 2002; cath 2009    Shoulder  surgery    Colonoscopy 2002    4/06-Inguinal herniorrhaphy   Family History:    Father: lung CA    Mother:CAD/ stent    Siblings: bro DM  Social History:    low fat    Occupation:Mechanic    Never Smoked    Alcohol use-no    Regular exercise-no   Risk Factors:  Tobacco use:  never Alcohol use:  no Exercise:  no  Family History Risk Factors:    Family History of MI in females < 7 years old:  no    Family History of MI in males < 40 years old:  no   Review of Systems  Eyes      Denies blurring, double vision, and vision loss-both eyes.  GI       Denies abdominal pain, bloody stools, dark tarry stools, and indigestion.      Hemorrhoids  GU      Denies incontinence and urinary frequency.  Neuro      Denies disturbances in coordination and poor balance.   Physical Exam  General:     in no acute distress; alert,appropriate and cooperative throughout examination; appears younger than age Neck:     No deformities, masses, or tenderness noted. Lungs:     Normal respiratory effort, chest expands symmetrically. Lungs are clear to auscultation, no crackles or wheezes. Heart:     Normal rate and regular rhythm. S1 and S2 normal without gallop, murmur, click, rub or other extra sounds. Abdomen:     Bowel sounds positive,abdomen soft and non-tender without masses, organomegaly or hernias noted. Rectal:     No external abnormalities noted. Normal sphincter tone. No rectal masses or tenderness. Genitalia:     Testes bilaterally descended without nodularity, tenderness or masses. No scrotal masses or lesions. No penis lesions or urethral discharge. Minor varicocoele on L Prostate:     Prostate gland firm and smooth, 1+ enlargement, w/o nodularity, tenderness, mass, asymmetry or induration. Pulses:     R and L carotid,radial, and posterior tibial pulses are full and equal bilaterally. Decreased DPP w/o ischemic chang Extremities:     No clubbing, cyanosis, edema. Minor DJD Neurologic:     alert & oriented X3 and DTRs symmetrical and normal.      Impression & Recommendations:  Problem # 1:  DIABETES MELLITUS, TYPE II (ICD-250.00)  His updated medication list for this problem includes:    Metformin Hcl 500 Mg Tb24 (Metformin hcl) .Marland Kitchen... 1 two times a day with 2 largest meals    Glimepiride 2 Mg Tabs (Glimepiride) .Marland Kitchen... 1/2 qd   Problem # 2:  HYPERTENSION, ESSENTIAL NOS (ICD-401.9)  His updated medication list for this problem includes:    Metoprolol Tartrate 50 Mg Tabs (Metoprolol tartrate) .Marland Kitchen... 1/2 tab bid   Problem # 3:   HYPERLIPIDEMIA (ICD-272.2)  The following medications were removed from the medication list:    Vytorin 10-20 Mg Tabs (Ezetimibe-simvastatin) .Marland Kitchen... 1 by mouth qd  His updated medication list for this problem includes:    Simvastatin 40 Mg Tabs (Simvastatin) .Marland Kitchen... 1 at bedtime   Problem # 4:  HYPERPLASIA PROSTATE UNS W/O UR OBST & OTH LUTS (ICD-600.90)  Problem # 5:  C A D (ICD-414.00)  His updated medication list for this problem includes:    Metoprolol Tartrate 50 Mg Tabs (Metoprolol tartrate) .Marland Kitchen... 1/2 tab bid   Complete Medication List: 1)  Metoprolol Tartrate 50 Mg Tabs (Metoprolol tartrate) .... 1/2 tab bid 2)  Metformin Hcl  500 Mg Tb24 (Metformin hcl) .Marland Kitchen.. 1 two times a day with 2 largest meals 3)  Glimepiride 2 Mg Tabs (Glimepiride) .... 1/2 qd 4)  Simvastatin 40 Mg Tabs (Simvastatin) .Marland Kitchen.. 1 at bedtime  Hypertension Assessment/Plan:      The patient's hypertensive risk group is category C: Target organ damage and/or diabetes.  Today's blood pressure is 140/72.    Lipid Assessment/Plan:      Based on NCEP/ATP III, the patient's risk factor category is "history of coronary disease, peripheral vascular disease, cerebrovascular disease, or aortic aneurysm along with either diabetes, current smoker, or LDL > 130 plus HDL < 40 plus triglycerides > 200".  From this information, the patient's calculated lipid goals are as follows: Total cholesterol goal is 200; LDL cholesterol goal is 70; HDL cholesterol goal is 40; Triglyceride goal is 150.  His LDL cholesterol goal has been met.     Patient Instructions: 1)  Complete stool cards. 2)  Please schedule a follow-up appointment in 3 months. 3)  BUN,creat,K+ prior to visit, ICD-9: 4)  Hepatic Panel prior to visit, ICD-9: 5)  Lipid Panel prior to visit, ICD-9: 6)  CBC w/ Diff prior to visit, ICD-9: 7)  PSA prior to visit, ICD-9: 8)  HbgA1C prior to visit, ICD-9: 9)  Urine Microalbumin prior to visit, ICD-9: Codes for FASTING labs :  272.4,414.00,995.20,600.90,250.00, 285.9. 10)  Check your Blood Pressure regularly. If it is above: 130/85 ON AVERAGE  you should make an appointment.   Prescriptions: METOPROLOL TARTRATE 50 MG  TABS (METOPROLOL TARTRATE) 1/2 tab bid  #90 Tablet x 3   Entered and Authorized by:   Unice Cobble MD   Signed by:   Unice Cobble MD on 12/30/2008   Method used:   Print then Give to Patient   RxID:   [id number] SIMVASTATIN 40 MG TABS (SIMVASTATIN) 1 at bedtime  #90 x 0   Entered and Authorized by:   Unice Cobble MD   Signed by:   Unice Cobble MD on 12/30/2008   Method used:   Print then Give to Patient   RxID:   [id number]

## 2011-01-17 NOTE — Progress Notes (Signed)
Summary: med refill   Phone Note Call from Patient   Caller: Patient Reason for Call: Refill Medication Summary of Call: dr. Linna Darner [phone] Baker Janus [phone] pt has a cpx scheduled for 11.18.08 and needs refill for his vytrion 10-20mg  1 a day, metoprolol 50mg  1 a day called into the pharmacy. he usually uses mail order.   Initial call taken by: Larene Pickett,  September 21, 2007 1:38 PM    New/Updated Medications: VYTORIN 10-20 MG  TABS (EZETIMIBE-SIMVASTATIN) 1 by mouth qd   Prescriptions: VYTORIN 10-20 MG  TABS (EZETIMIBE-SIMVASTATIN) 1 by mouth qd  #30 x 1   Entered by:   Janelle Floor   Authorized by:   Unice Cobble MD   Signed by:   Janelle Floor on 09/21/2007   Method used:   Telephoned to ...         [id number] METOPROLOL TARTRATE 50 MG  TABS (METOPROLOL TARTRATE) 1/2 tab bid  #30 x 1   Entered by:   Janelle Floor   Authorized by:   Unice Cobble MD   Signed by:   Janelle Floor on 09/21/2007   Method used:   Telephoned to ...         [id number]  called to pharmacy and let patient know

## 2011-01-17 NOTE — Assessment & Plan Note (Signed)
Summary: CPX RESCHEDULED//CYD   Vital Signs:  Patient Profile:   77 Years Old Male Weight:      204.25 pounds Pulse rate:   64 / minute Pulse rhythm:   regular Resp:     17 per minute BP sitting:   138 / 70  (left arm) Cuff size:   large  Pt. in pain?   no  Vitals Entered By: Janelle Floor (November 03, 2007 11:04 AM)                  Chief Complaint:  cpx.  History of Present Illness: has a cold  Current Allergies (reviewed today): ! CODEINE  Past Medical History:    Hyperlipidemia    Hypertension  Past Surgical History:    Coronary artery bypass graft x7 vessels 1997 ; cath 2002    Shoulder surgery    Colonoscopy 2002    Inguinal herniorrhaphy   Family History:    Father: lung CA    Mother: stent    Siblings: bro DM  Social History:    low fat   Risk Factors:  Tobacco use:  never Alcohol use:  no Exercise:  yes    Type:  walks 2 mpd 5X/week w/o C-P sx   Review of Systems  General      Complains of sweats.      Denies chills, fatigue, fever, loss of appetite, malaise, sleep disorder, weakness, and weight loss.  Eyes      Denies blurring, discharge, double vision, eye irritation, eye pain, halos, itching, light sensitivity, red eye, vision loss-1 eye, and vision loss-both eyes.      Last exam 2005  ENT      Complains of nasal congestion and ringing in ears.      No facial pain or purulence  CV      Denies bluish discoloration of lips or nails, chest pain or discomfort, difficulty breathing at night, difficulty breathing while lying down, leg cramps with exertion, palpitations, shortness of breath with exertion, swelling of feet, and swelling of hands.  Resp      Complains of cough and sputum productive.      Denies chest pain with inspiration, pleuritic, shortness of breath, and wheezing.      some yelow sputum X 5 days  GI      Complains of indigestion.      Denies abdominal pain, bloody stools, change in bowel habits, constipation,  dark tarry stools, diarrhea, nausea, and vomiting.      Prn TUMS for dyspepsia  GU      Complains of decreased libido.      Denies discharge, erectile dysfunction, hematuria, and nocturia.  MS      Complains of joint pain, low back pain, and stiffness.      Denies joint redness and joint swelling.  Derm      Denies changes in color of skin, changes in nail beds, dryness, excessive perspiration, flushing, hair loss, itching, lesion(s), poor wound healing, and rash.  Neuro      Denies difficulty with concentration, disturbances in coordination, headaches, memory loss, numbness, poor balance, sensation of room spinning, and tingling.  Psych      Denies anxiety, depression, easily angered, easily tearful, and irritability.  Endo      Denies cold intolerance, excessive hunger, excessive thirst, excessive urination, heat intolerance, polyuria, and weight change.  Heme      Denies abnormal bruising and bleeding.  Allergy      Denies  hives or rash, itching eyes, persistent infections, seasonal allergies, and sneezing.   Physical Exam  General:     Well-developed,well-nourished,in no acute distress; alert,appropriate and cooperative throughout examination Head:     Normocephalic and atraumatic without obvious abnormalities. No apparent alopecia or balding. Eyes:     No corneal or conjunctival inflammation noted.Perrla. Funduscopic exam benign, without hemorrhages, exudates or papilledema. Art narrowing Ears:     External ear exam shows no significant lesions or deformities.  Otoscopic examination reveals clear canals, tympanic membranes are intact bilaterally without bulging, retraction, inflammation or discharge. Hearing is grossly normal bilaterally. Nose:     External nasal examination shows no deformity or inflammation. Nasal mucosa are pink and moist without lesions or exudates. Mouth:     Oral mucosa and oropharynx without lesions or exudates.  Teeth in good repair. Neck:      No deformities, masses, or tenderness noted. Lungs:     Normal respiratory effort, chest expands symmetrically. Lungs are clear to auscultation, no crackles or wheezes. Heart:     Normal rate and regular rhythm. S1 and S2 normal without gallop, murmur, click, rub. S4 with slurring Abdomen:     Bowel sounds positive,abdomen soft and non-tender without masses, organomegaly or hernias noted. Rectal:     No external abnormalities noted. Normal sphincter tone. No rectal masses or tenderness.FOB neg Genitalia:     Testes bilaterally descended without nodularity, tenderness or masses. No scrotal masses or lesions. No penis lesions or urethral discharge. Prostate:     1+ enlarged.   Msk:     No deformity or scoliosis noted of thoracic or lumbar spine.   Pulses:     R and L carotid,radial,dorsalis pedis and posterior tibial pulses are full and equal bilaterally Extremities:     DJD of hands Neurologic:     strength normal in all extremities, gait normal, and DTRs symmetrical and normal.   Skin:     Solar changes Cervical Nodes:     No lymphadenopathy noted Axillary Nodes:     No palpable lymphadenopathy Psych:     Oriented X3, memory intact for recent and remote, normally interactive, good eye contact, not anxious appearing, and not depressed appearing.      Impression & Recommendations:  Problem # 1:  ROUTINE GENERAL MEDICAL EXAM@HEALTH  CARE FACL (ICD-V70.0)  Orders: EKG w/ Interpretation (93000) TLB-Lipid Panel (80061-LIPID) TLB-BMP (Basic Metabolic Panel-BMET) (99991111) TLB-CBC Platelet - w/Differential (85025-CBCD) TLB-Hepatic/Liver Function Pnl (80076-HEPATIC) TLB-TSH (Thyroid Stimulating Hormone) (84443-TSH) TLB-A1C / Hgb A1C (Glycohemoglobin) (83036-A1C) TLB-PSA (Prostate Specific Antigen) (84153-PSA)   Problem # 2:  C A D (ICD-414.00)  His updated medication list for this problem includes:    Metoprolol Tartrate 50 Mg Tabs (Metoprolol tartrate) .Marland Kitchen... 1/2 tab  bid   Problem # 3:  HYPERLIPIDEMIA (ICD-272.4)  His updated medication list for this problem includes:    Vytorin 10-20 Mg Tabs (Ezetimibe-simvastatin) .Marland Kitchen... 1 by mouth qd   Problem # 4:  HYPERTENSION (ICD-401.9)  His updated medication list for this problem includes:    Metoprolol Tartrate 50 Mg Tabs (Metoprolol tartrate) .Marland Kitchen... 1/2 tab bid   Problem # 5:  HYPERPLASIA PROSTATE UNS W/O UR OBST & OTH LUTS (ICD-600.90)  Orders: TLB-PSA (Prostate Specific Antigen) (84153-PSA)   Problem # 6:  OTHER ABNORMAL BLOOD CHEMISTRY (ICD-790.6)  Orders: TLB-A1C / Hgb A1C (Glycohemoglobin) (83036-A1C)   Complete Medication List: 1)  Metoprolol Tartrate 50 Mg Tabs (Metoprolol tartrate) .... 1/2 tab bid 2)  Vytorin 10-20 Mg Tabs (Ezetimibe-simvastatin) .Marland KitchenMarland KitchenMarland Kitchen  1 by mouth qd  Other Orders: Influenza Vaccine MCR [id number])   Patient Instructions: 1)  Complete stool cards    Prescriptions: VYTORIN 10-20 MG  TABS (EZETIMIBE-SIMVASTATIN) 1 by mouth qd  #90 x 3   Entered and Authorized by:   Unice Cobble MD   Signed by:   Unice Cobble MD on 11/03/2007   Method used:   Print then Give to Patient   RxID:   [id number] METOPROLOL TARTRATE 50 MG  TABS (METOPROLOL TARTRATE) 1/2 tab bid  #90 x 3   Entered and Authorized by:   Unice Cobble MD   Signed by:   Unice Cobble MD on 11/03/2007   Method used:   Print then Give to Patient   RxID:   [id number]  ]  Influenza Vaccine    Vaccine Type: Fluvax MCR    Site: left deltoid    Mfr: [place]    Dose: 0.5 ml    Route: IM    Given by: Janelle Floor    Exp. Date: 05/2008    Lot #: [id number]  Flu Vaccine Consent Questions    Do you have a history of severe allergic reactions to this vaccine? no    Any prior history of allergic reactions to egg and/or gelatin? no    Do you have a sensitivity to the preservative Thimersol? no    Do you have a past history of Guillan-Barre Syndrome? no    Do you currently have an acute  febrile illness? no    Have you ever had a severe reaction to latex? no    Vaccine information given and explained to patient? yes

## 2011-01-17 NOTE — Letter (Signed)
Summary: Results Follow up Letter  [facility]   [place], [place] [address]   Phone: [phone]  Fax: [phone]    01/26/2009 MRN: [id number]  [facility] [address] [place], [place]  [address]  Dear Mr. MCFARLEN,  The following are the results of your recent test(s):  Test         Result    Pap Smear:        Normal _____  Not Normal _____ Comments: ______________________________________________________ Cholesterol: LDL(Bad cholesterol):         Your goal is less than:         HDL (Good cholesterol):       Your goal is more than: Comments:  ______________________________________________________ Mammogram:        Normal _____  Not Normal _____ Comments:  ___________________________________________________________________ Hemoccult:        Normal _X____  Not normal _______ Comments:    _____________________________________________________________________ Other Tests:    We routinely do not discuss normal results over the telephone.  If you desire a copy of the results, or you have any questions about this information we can discuss them at your next office visit.   Sincerely,

## 2011-01-17 NOTE — Assessment & Plan Note (Signed)
Summary: roa 4 months,cbs   Vital Signs:  Patient Profile:   77 Years Old Male Weight:      191 pounds Pulse rate:   58 / minute Resp:     12 per minute BP sitting:   138 / 72  (left arm) Cuff size:   large  Vitals Entered By: Georgette Dover (July 08, 2008 9:30 AM)                 Chief Complaint:  4 month follow-up and Type 2 diabetes mellitus follow-up.  History of Present Illness:  Type 2 Diabetes Mellitus Follow-Up      This is a 77 year old man who presents for Type 2 diabetes mellitus follow-up.  On low carb diet he has lost 12#. FBS 116-128; pc < 138. Vision improved. Walks 2 mpd w/o symptoms.  The patient reports weight loss, but denies polyuria, polydipsia, blurred vision, self managed hypoglycemia, hypoglycemia requiring help, weight gain, and numbness of extremities.  The patient denies the following symptoms: neuropathic pain, chest pain, vomiting, orthostatic symptoms, poor wound healing, intermittent claudication, vision loss, and foot ulcer.  Since the last visit the patient reports good dietary compliance, compliance with medications, exercising regularly, and monitoring blood glucose.  The patient has been measuring capillary blood glucose before breakfast and after dinner.  Since the last visit, the patient reports having had eye care by an ophthalmologist and no foot care.  A1c down from 6.4% to 6.3%    Current Allergies: ! CODEINE      Physical Exam  General:     well-nourished,in no acute distress; alert,appropriate and cooperative throughout examination; ideal weight Heart:     Normal rate and regular rhythm. S1 and S2 normal without gallop, murmur, click, rub. S4 with slurring Pulses:     R and L carotid,radial,dorsalis pedis and posterior tibial pulses are full and equal bilaterally Extremities:     Good nail health, no ischemic changes Psych:     memory intact for recent and remote, normally interactive, and good eye contact.  Focused &  motivated    Impression & Recommendations:  Problem # 1:  DIABETES MELLITUS, TYPE II (ICD-250.00) Controlled His updated medication list for this problem includes:    Metformin Hcl 500 Mg Tb24 (Metformin hcl) .Marland Kitchen... 1 two times a day with 2 largest meals    Glimepiride 2 Mg Tabs (Glimepiride) .Marland Kitchen... 1/2 qd   Complete Medication List: 1)  Metoprolol Tartrate 50 Mg Tabs (Metoprolol tartrate) .... 1/2 tab bid 2)  Vytorin 10-20 Mg Tabs (Ezetimibe-simvastatin) .Marland Kitchen.. 1 by mouth qd 3)  Metformin Hcl 500 Mg Tb24 (Metformin hcl) .Marland Kitchen.. 1 two times a day with 2 largest meals 4)  Glimepiride 2 Mg Tabs (Glimepiride) .... 1/2 qd   Patient Instructions: 1)  Please schedule a follow-up appointment in 6 months.   Prescriptions: GLIMEPIRIDE 2 MG  TABS (GLIMEPIRIDE) 1/2 qd  #90 Tablet x 1   Entered and Authorized by:   Unice Cobble MD   Signed by:   Unice Cobble MD on 07/08/2008   Method used:   Print then Give to Patient   RxID:   [id number]  ]

## 2011-01-17 NOTE — Letter (Signed)
Summary: [facility]   Imported By: Velora Heckler 05/02/2008 16:31:32  _____________________________________________________________________  External Attachment:    Type:   Image     Comment:   External Document  Appended Document: [facility] Dr Samara Snide: no DM retinopathy

## 2011-01-17 NOTE — Medication Information (Signed)
Summary: Noncompliance with Metoprolol/BCBS  Noncompliance with Metoprolol/BCBS   Imported By: Edmonia James 01/03/2009 09:24:59  _____________________________________________________________________  External Attachment:    Type:   Image     Comment:   External Document

## 2011-01-17 NOTE — Letter (Signed)
Summary: Results Follow up Letter  [facility]   [place], [place] [address]   Phone: [phone]  Fax: [phone]    01/12/2008 MRN: [id number]  [facility] [address] [place], [place]  [address]  Dear Ethan Mcneil,  The following are the results of your recent test(s):  Test         Result    Pap Smear:        Normal _____  Not Normal _____ Comments: ______________________________________________________ Cholesterol: LDL(Bad cholesterol):         Your goal is less than:         HDL (Good cholesterol):       Your goal is more than: Comments:  ______________________________________________________ Mammogram:        Normal _____  Not Normal _____ Comments:  ___________________________________________________________________ Hemoccult:        Normal _____  Not normal _______ Comments:    _____________________________________________________________________ Other Tests:  Please see attached results and comments   We routinely do not discuss normal results over the telephone.  If you desire a copy of the results, or you have any questions about this information we can discuss them at your next office visit.   Sincerely,

## 2011-01-17 NOTE — Medication Information (Signed)
Summary: Potential Medication Noncompliance/BCBS  Potential Medication Noncompliance/BCBS   Imported By: Edmonia James 07/12/2008 11:08:33  _____________________________________________________________________  External Attachment:    Type:   Image     Comment:   External Document

## 2011-01-17 NOTE — Assessment & Plan Note (Signed)
Summary: f72m/dm   CC:  6 month check up.  History of Present Illness: [name] is seen today for f/U of CAD S/P CABG in 1997.  He has some SSCP in 10/2008 and cath showed widely patent grafts that looked quite pristine for there age.  He has not had any more pain.  He has been compliant with his meds.  His BS is better controlled now running 90-110 in the mornings.  He needs to see the eye doctor still.  He has been compliant with his meds.  He is atill very active and Comptroller.  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 1/2 Tab Two Times A Day 2)  Metformin Hcl 500 Mg  Tb24 (Metformin Hcl) .Marland Kitchen.. 1 Two Times A Day With 2 Largest Meals 3)  Glimepiride 2 Mg  Tabs (Glimepiride) .... 1/2 By Mouth Once Daily 4)  Simvastatin 40 Mg Tabs (Simvastatin) .Marland Kitchen.. 1 At Bedtime 5)  Multivitamins   Tabs (Multiple Vitamin) .Marland Kitchen.. 1 Tab By Mouth Once Daily 6)  Aspirin 81 Mg  Tabs (Aspirin) .Marland Kitchen.. 1 Tab By Mouth Two Times A Day 7)  Flax   Oil (Flaxseed (Linseed)) .... Tab By Mouth Once Daily  Allergies (verified): 1)  ! Codeine  Past History:  Past Medical History: Last updated: 05/02/2009 CHEST PAIN-UNSPECIFIED (ICD-786.50) PVD (ICD-443.9) HYPERPLASIA PROSTATE UNS W/O UR OBST & OTH LUTS (ICD-600.90) DIABETES MELLITUS, TYPE II (ICD-250.00) HYPERTENSION, ESSENTIAL NOS (ICD-401.9) HYPERLIPIDEMIA (ICD-272.2) C A D (ICD-414.00) CABG 25' grafts patent cath [facility]  10/2008 HERNIORRHAPHY, HX OF (ICD-V45.89) POSTSURGICAL AORTOCORONARY BYPASS STATUS (ICD-V45.81)    Past Surgical History: Last updated: 04/06/2009 Coronary artery bypass graft x7 vessels 1997 ; cath 2002; cath 2009 : grafts patent Shoulder surgery Colonoscopy 2002 4/06-Inguinal herniorrhaphy  Family History: Last updated: 12/30/2008 Father: lung CA Mother:CAD/ stent Siblings: bro DM  Social History: Last updated: 05/03/2009 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 All other systems reviewed and negative  Vital Signs:  Patient profile:   77 year old male Height:      70 inches Weight:      190 pounds BMI:     27.36 Pulse rate:   49 / minute Resp:     14 per minute BP sitting:   127 / 66  (left arm)  Vitals Entered By: Burnett Kanaris (October 26, 2009 9:18 AM)  Physical Exam  General:  Affect appropriate Healthy:  appears stated age 15: normal Neck supple with no adenopathy JVP normal no bruits no thyromegaly Lungs clear with no wheezing and good diaphragmatic motion Heart:  S1/S2 no murmur,rub, gallop or click PMI normal Abdomen: benighn, BS positve, no tenderness, no AAA no bruit.  No  HSM or HJR Distal pulses intact with no bruits No edema Neuro non-focal Skin warm and dry    Impression & Recommendations:  Problem # 1:  C A D (ICD-414.00) CABG in 1997, patent grafts cath 10/2008.  No angina His updated medication list for this problem includes:    Metoprolol Tartrate 50 Mg Tabs (Metoprolol tartrate) .Marland Kitchen... 1/2 tab two times a day    Aspirin 81 Mg Tabs (Aspirin) .Marland Kitchen... 1 tab by mouth two times a day  Problem # 2:   HYPERTENSION, ESSENTIAL NOS (ICD-401.9) Well controlled continue low soidum diet His updated medication list for this problem includes:    Metoprolol Tartrate 50 Mg Tabs (Metoprolol tartrate) .Marland Kitchen... 1/2 tab two times a day    Aspirin 81 Mg Tabs (Aspirin) .Marland Kitchen... 1 tab by mouth two times a day  Problem # 3:  DIABETES MELLITUS, TYPE II (ICD-250.00) Better control.  See opthamologist.  Per primary add ACE for renal protection His updated medication list for this problem includes:    Metformin Hcl 500 Mg Tb24 (Metformin hcl) .Marland Kitchen... 1 two times a day with 2 largest meals    Glimepiride 2 Mg Tabs (Glimepiride) .Marland Kitchen... 1/2 by mouth once daily    Aspirin 81 Mg Tabs (Aspirin) .Marland Kitchen... 1 tab by mouth two times a day  Problem # 4:  HYPERLIPIDEMIA (ICD-272.2) At goal with no side effects His updated medication list for this problem includes:    Simvastatin 40 Mg Tabs (Simvastatin) .Marland Kitchen... 1 at bedtime  CHOL: 114 (03/24/2009)   LDL: 62 (03/24/2009)   HDL: 40.70 (03/24/2009)   TG: 58.0 (03/24/2009) CHOL (goal): 200 (12/30/2008)   LDL (goal): 70 (12/30/2008)   HDL (goal): 40 (12/30/2008)   TG (goal): 150 (12/30/2008)   Echocardiogram Report  Procedure date:  10/26/2009  Findings:      NSR 49 Otherwise normal ECG

## 2011-01-17 NOTE — Assessment & Plan Note (Signed)
Summary: f3y/dm   History of Present Illness: [name] is seen today for f/U of CAD S/P CABG in 1997.  He has some SSCP in 10/2008 and cath showed widely patent grafts that looked quite pristine for there age.  He has not had any more pain.  He has been compliant with his meds.  His BS is better controlled now running 90-110 in the mornings.  He needs to see the eye doctor still.  He has been compliant with his meds.  He is atill very active and Comptroller.  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 1/2 Tab Two Times A Day 2)  Metformin Hcl 500 Mg  Tb24 (Metformin Hcl) .Marland Kitchen.. 1 Two Times A Day With 2 Largest Meals 3)  Simvastatin 40 Mg Tabs (Simvastatin) .Marland Kitchen.. 1 At Bedtime 4)  Multivitamins   Tabs (Multiple Vitamin) .Marland Kitchen.. 1 Tab By Mouth Once Daily 5)  Aspirin 81 Mg  Tabs (Aspirin) .Marland Kitchen.. 1 Tab By Mouth Two Times A Day 6)  Flax   Oil (Flaxseed (Linseed)) .... Tab By Mouth Once Daily 7)  Freestyle Lite Test  Strp (Glucose Blood) .... 250.00, Check Bloodsugars One Daily  Allergies (verified): 1)  ! Codeine  Past History:  Past Medical History: Last updated: 05/10/2010 PVD (ICD-443.9) HYPERPLASIA PROSTATE UNS W/O UR OBST & OTH LUTS (ICD-600.90) DIABETES MELLITUS,  TYPE II (ICD-250.00) HYPERTENSION, ESSENTIAL NOS (ICD-401.9) HYPERLIPIDEMIA (ICD-272.2) C A D (ICD-414.00) CABG 1997; grafts patent  @ cath [facility] 10/2008 HERNIORRHAPHY, HX OF (ICD-V45.89) POSTSURGICAL AORTOCORONARY BYPASS STATUS (ICD-V45.81)    Past Surgical History: Last updated: 05/10/2010 Coronary artery bypass graft x7 vessels 1997 ; cath 2002; cath 2009 : grafts patent Shoulder surgery Colonoscopy negative  2002 03/2005-Inguinal herniorrhaphy  Family History: Last updated: 12/30/2008 Father: lung CA Mother:CAD/ stent Siblings: bro DM  Social History: Last updated: 05/03/2009 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   Vital Signs:  Patient profile:   77 year old male Height:      70 inches Weight:      193 pounds BMI:     27.79 Pulse rate:   62 / minute BP sitting:   138 / 64  (left arm)  Vitals Entered By: Chanetta Marshall RN BSN (October 29, 2010 9:59 AM)  Physical Exam  General:  Affect appropriate Healthy:  appears stated age 77: normal Neck supple with no adenopathy JVP normal no bruits no thyromegaly Lungs clear with no wheezing and good diaphragmatic motion  Heart:  S1/S2 no murmur,rub, gallop or click PMI normal Abdomen: benighn, BS positve, no tenderness, no AAA no bruit.  No HSM or HJR Distal pulses intact with no bruits No edema Neuro non-focal Skin warm and dry    Impression & Recommendations:  Problem # 1:  BRADYCARDIA (ICD-427.89) Asymptomatic may need to cut back BB in future.   His updated medication list for this problem includes:    Metoprolol Tartrate 50 Mg Tabs (Metoprolol tartrate) .Marland Kitchen... 1/2 tab two times a  day    Aspirin 81 Mg Tabs (Aspirin) .Marland Kitchen... 1 tab by mouth two times a day  Problem # 2:  HYPERTENSION, ESSENTIAL NOS (ICD-401.9) Well contorlled His updated medication list for this problem includes:    Metoprolol Tartrate 50 Mg Tabs (Metoprolol tartrate) .Marland Kitchen... 1/2 tab two times a day    Aspirin 81 Mg Tabs (Aspirin) .Marland Kitchen... 1 tab by mouth two times a day  Problem # 3:  HYPERLIPIDEMIA (ICD-272.2) At goal with no side effects His updated medication list for this problem includes:    Simvastatin 40 Mg Tabs (Simvastatin) .Marland Kitchen... 1 at bedtime  CHOL: 117 (05/02/2010)   LDL: 65 (05/02/2010)   HDL: 43.40 (05/02/2010)   TG: 45.0 (05/02/2010) CHOL (goal): 200 (12/30/2008)   LDL (goal): 70 (12/30/2008)   HDL (goal): 40 (12/30/2008)   TG (goal): 150 (12/30/2008)  Problem # 4:  C A D (ICD-414.00) CABG 97 patent grafts cath 2009 No angina  Continue medical Rx His updated medication list for this problem includes:    Metoprolol Tartrate 50 Mg Tabs (Metoprolol tartrate) .Marland Kitchen... 1/2 tab two times a day    Aspirin 81 Mg Tabs (Aspirin) .Marland Kitchen... 1 tab by mouth two times a day  Patient Instructions: 1)  Your physician wants you to follow-up in: 12 months with Dr Johnsie Cancel.  You will receive a reminder letter in the mail two months in advance. If you don't receive a letter, please call our office to schedule the follow-up appointment.   EKG Report  Procedure date:  10/29/2010  Findings:      SR 51 Normal ECG

## 2011-01-17 NOTE — Assessment & Plan Note (Signed)
Summary: TO GO OVER LABS AND 6 MONTH OV//PH   Vital Signs:  Patient profile:   77 year old male Weight:      186.8 pounds BMI:     26.90 Pulse rate:   60 / minute Resp:     14 per minute BP sitting:   120 / 70  (left arm) Cuff size:   large  Vitals Entered By: Georgette Dover (May 10, 2010 8:04 AM) CC: To go over labs and follow-up, Lipid Management, Shoulder pain Comments REVIEWED MED LIST, PATIENT AGREED DOSE AND INSTRUCTION CORRECT    CC:  To go over labs and follow-up, Lipid Management, and Shoulder pain.  History of Present Illness: Serial labs reviewed & CAD  & DM risks discussed .LDL is @ goal of < 70. No glucose checks due to cost of  strips ,$90 for 50. Weight stable; no hypoglycemia. CVE as walking 30 min 5X/ week w/o symptoms  or   limitations. No salt , increased vegetables in diet. Immunizations reviewed ; Pneumovax needed.  No home safety issues identified.Living Will in place. ALL ( #1-12) AWV items reviewed with him ; see ROS  & PE for documentation. See Patient Instructions for "Care Plan" & "Counseling". He has also had an active issue of R shoulder pain with posterior ROM X 2 months.   The patient reports impaired ROM, but denies numbness,  tingling, locking, stiffness, swelling, and redness.  The pain began gradually and without injury.  The patient describes the pain as always  with posterior rotation.  The pain is better with  Tylenol.  The pain is worse with activity as noted.  It has affected ADL , specifically working with arms above head & combing hair.  Lipid Management History:      Positive NCEP/ATP III risk factors include male age 78 years old or older, diabetes, hypertension, ASHD (either angina/prior MI/prior CABG), and peripheral vascular disease.  Negative NCEP/ATP III risk factors include no family history for ischemic heart disease, non-tobacco-user status, no prior stroke/TIA, and no history of aortic aneurysm.     Allergies: 1)  ! Codeine  Past  History:  Past Medical History: PVD (ICD-443.9) HYPERPLASIA PROSTATE UNS W/O UR OBST & OTH LUTS (ICD-600.90) DIABETES MELLITUS, TYPE II (ICD-250.00) HYPERTENSION, ESSENTIAL NOS (ICD-401.9) HYPERLIPIDEMIA (ICD-272.2) C A D (ICD-414.00) CABG 1997; grafts patent  @ cath [facility] 10/2008 HERNIORRHAPHY, HX OF (ICD-V45.89) POSTSURGICAL AORTOCORONARY BYPASS STATUS (ICD-V45.81)    Past Surgical History: Coronary artery bypass graft x7 vessels 1997 ; cath 2002; cath 2009 : grafts patent Shoulder surgery Colonoscopy negative  2002 03/2005-Inguinal herniorrhaphy  Review of Systems General:  Denies fatigue and weight loss. Eyes:  Denies blurring, double vision, and vision loss-both eyes; Last exam 18 months; no retinopathy. ENT:  Complains of ringing in ears; denies decreased hearing; No change in hearing. CV:  Denies chest pain or discomfort, leg cramps with exertion, lightheadness, near fainting, palpitations, shortness of breath with exertion, swelling of feet, and swelling of hands. GI:  Denies bloody stools, dark tarry stools, and indigestion. GU:  Denies discharge, dysuria, and hematuria. Derm:  Denies poor wound healing. Neuro:  Denies disturbances in coordination, falling down, memory loss, numbness, poor balance, and tingling. Psych:  Denies anxiety and depression; No emotional threat @ home. Endo:  Denies excessive hunger, excessive thirst, and excessive urination.  Physical Exam  General:  in no acute distress; alert,appropriate and cooperative throughout examination Ears:  . Hearing is grossly normal bilaterally. Neck:  No deformities,  masses, or tenderness noted. Lungs:  Normal respiratory effort, chest expands symmetrically. Lungs are clear to auscultation, no crackles or wheezes. Heart:  regular rhythm, no murmur, no gallop, no rub, no JVD, no HJR, and bradycardia.   Abdomen:  Bowel sounds positive,abdomen soft and non-tender without masses, organomegaly or hernias noted. Msk:   No deformity or scoliosis noted of thoracic or lumbar spine but R thoracic musculature > L.   Pulses:  R and L carotid,radial and posterior tibial pulses are full and equal bilaterally. Decreased DPP w/o ischemic change Extremities:  No clubbing, cyanosis, edema, or  significant deformity noted. Crepitus of shoulders; pain with posterior rotation on R. Good nail health. Neurologic:  alert & oriented X3, strength normal in all extremities, and DTRs symmetrical and normal.   Skin:  Intact without suspicious lesions or rashes. Minor bruising Cervical Nodes:  No lymphadenopathy noted Axillary Nodes:  No palpable lymphadenopathy Psych:  memory intact for recent and remote, normally interactive, good eye contact, not anxious appearing, and not depressed appearing.     Impression & Recommendations:  Problem # 1:  PREVENTIVE HEALTH CARE (ICD-V70.0)  Problem # 2:  DIABETES MELLITUS, TYPE II (ICD-250.00)  The following medications were removed from the medication list:    Glimepiride 2 Mg Tabs (Glimepiride) .Marland Kitchen... 1/2 by mouth once daily His updated medication list for this problem includes:    Metformin Hcl 500 Mg Tb24 (Metformin hcl) .Marland Kitchen... 1 two times a day with 2 largest meals    Aspirin 81 Mg Tabs (Aspirin) .Marland Kitchen... 1 tab by mouth two times a day  Orders: Subsequent annual wellness visit with prevention plan [id number])  Problem # 3:  HYPERTENSION, ESSENTIAL NOS (ICD-401.9) controlled His updated medication list for this problem includes:    Metoprolol Tartrate 50 Mg Tabs (Metoprolol tartrate) .Marland Kitchen... 1/2 tab two times a day  Orders: Subsequent annual wellness visit with prevention plan [id number])  Problem # 4:  HYPERLIPIDEMIA (ICD-272.2) Lipids @ goal His updated medication list for this problem includes:    Simvastatin 40 Mg Tabs (Simvastatin) .Marland Kitchen... 1 at bedtime  Orders: Subsequent annual wellness visit with prevention plan [id number])  Problem # 5:  C A D (ICD-414.00) as per Dr Johnsie Cancel His  updated medication list for this problem includes:    Metoprolol Tartrate 50 Mg Tabs (Metoprolol tartrate) .Marland Kitchen... 1/2 tab two times a day    Aspirin 81 Mg Tabs (Aspirin) .Marland Kitchen... 1 tab by mouth two times a day  Orders: Subsequent annual wellness visit with prevention plan [id number])  Problem # 6:  SHOULDER PAIN, RIGHT (ICD-719.41) New issue His updated medication list for this problem includes:    Aspirin 81 Mg Tabs (Aspirin) .Marland Kitchen... 1 tab by mouth two times a day  Orders: Subsequent annual wellness visit with prevention plan [id number]) Physical Therapy Referral (PT)  Complete Medication List: 1)  Metoprolol Tartrate 50 Mg Tabs (Metoprolol tartrate) .... 1/2 tab two times a day 2)  Metformin Hcl 500 Mg Tb24 (Metformin hcl) .Marland Kitchen.. 1 two times a day with 2 largest meals 3)  Simvastatin 40 Mg Tabs (Simvastatin) .Marland Kitchen.. 1 at bedtime 4)  Multivitamins Tabs (Multiple vitamin) .Marland Kitchen.. 1 tab by mouth once daily 5)  Aspirin 81 Mg Tabs (Aspirin) .Marland Kitchen.. 1 tab by mouth two times a day 6)  Flax Oil (Flaxseed (linseed)) .... Tab by mouth once daily 7)  Freestyle Lite Test Strp (Glucose blood) .... 250.00, check bloodsugars one daily  Other Orders: Pneumococcal Vaccine [id number] Admin 1st Vaccine [id number])  Lipid  Assessment/Plan:      Based on NCEP/ATP III, the patient's risk factor category is "history of coronary disease, peripheral vascular disease, cerebrovascular disease, or aortic aneurysm along with either diabetes, current smoker, or LDL > 130 plus HDL < 40 plus triglycerides > 200".  The patient's lipid goals are as follows: Total cholesterol goal is 200; LDL cholesterol goal is 70; HDL cholesterol goal is 40; Triglyceride goal is 150.  His LDL cholesterol goal has been met.    Patient Instructions: 1)  Check your blood sugars regularly. If your readings are usually above : 150 or below 90 you should contact our office. 2)  It is important that your Diabetic A1c level is checked every 3 months. 3)  See your eye  doctor yearly to check for diabetic eye damage. 4)  Check your feet each night for sore areas, calluses or signs of infection. 5)  Check your Blood Pressure regularly. If it is above:140/90 ON AVERAGE  you should make an appointment. Consider Shingles vaccine. 6)  Take 650-1000mg  of Tylenol every 4-6 hours as needed for relief of pain or comfort of fever AVOID taking more than 4000mg   in a 24 hour period (can cause liver damage in higher doses). Prescriptions: SIMVASTATIN 40 MG TABS (SIMVASTATIN) 1 at bedtime  #90 x 3   Entered and Authorized by:   Unice Cobble MD   Signed by:   Unice Cobble MD on 05/10/2010   Method used:   Print then Give to Patient   RxID:   [id number] METFORMIN HCL 500 MG  TB24 (METFORMIN HCL) 1 two times a day with 2 largest meals  #180 Tablet x 2   Entered and Authorized by:   Unice Cobble MD   Signed by:   Unice Cobble MD on 05/10/2010   Method used:   Print then Give to Patient   RxID:   [id number] METOPROLOL TARTRATE 50 MG  TABS (METOPROLOL TARTRATE) 1/2 tab two times a day  #90 Tablet x 3   Entered and Authorized by:   Unice Cobble MD   Signed by:   Unice Cobble MD on 05/10/2010   Method used:   Print then Give to Patient   RxID:   [id number]    Immunizations Administered:  Pneumonia Vaccine:    Vaccine Type: Pneumovax (Medicare)    Site: right deltoid    Mfr: Merck    Dose: 0.5 ml    Route: IM    Given by: Georgette Dover    Exp. Date: 11/24/2010    Lot #: [id number]

## 2011-01-17 NOTE — Letter (Signed)
Summary: Letter from Patient Regarding CT  Letter from Patient Regarding CT   Imported By: Edmonia James 10/15/2010 08:54:23  _____________________________________________________________________  External Attachment:    Type:   Image     Comment:   External Document

## 2011-01-17 NOTE — Medication Information (Signed)
Summary: ACE I or ARB Therapy w/Diabetes & Hypertension/BCBS  ACE I or ARB Therapy w/Diabetes & Hypertension/BCBS   Imported By: Edmonia James 01/10/2010 15:02:20  _____________________________________________________________________  External Attachment:    Type:   Image     Comment:   External Document

## 2011-01-17 NOTE — Assessment & Plan Note (Signed)
Summary: review labs/cbs   Vital Signs:  Patient profile:   77 year old male Weight:      191.6 pounds BMI:     27.59 Pulse rate:   60 / minute Resp:     16 per minute BP sitting:   130 / 76  (left arm) Cuff size:   large  Vitals Entered By: Georgette Dover (April 06, 2009 11:44 AM) CC: FOLLOW-UP ON MEDS   CC:  FOLLOW-UP ON MEDS.  History of Present Illness: On no salt, low fat diet; CVE as walking 30 min 5X/week w/o symptoms.FBS 118-145; no 2 hr post meal glucoses. No hypoglycemia.  Allergies: 1)  ! Codeine  Past History:  Past Medical History:    Hyperlipidemia    Hypertension    CAD, Dr Johnsie Cancel    PVD    Diabetes mellitus, type II  Past Surgical History:    Coronary artery bypass graft x7 vessels 1997 ; cath 2002; cath 2009 : grafts patent    Shoulder surgery    Colonoscopy 2002    4/06-Inguinal herniorrhaphy  Review of Systems General:  Denies fatigue and weight loss. Eyes:  Denies blurring, double vision, and vision loss-both eyes; Last Ophth exam 2009 ; no retinopathy. CV:  Denies chest pain or discomfort, leg cramps with exertion, lightheadness, near fainting, palpitations, and shortness of breath with exertion. Resp:  Denies cough, shortness of breath, and sputum productive. GI:  Denies abdominal pain, bloody stools, dark tarry stools, and indigestion; FOB neg < 6 months ago. Derm:  Denies changes in color of skin, lesion(s), and poor wound healing. Neuro:  Denies numbness and tingling; No burning. Endo:  Denies excessive hunger, excessive thirst, and excessive urination.  Physical Exam  General:  Appears younger than age,well-nourished,in no acute distress; alert,appropriate and cooperative throughout examination Neck:  No deformities, masses, or tenderness noted. Lungs:  Normal respiratory effort, chest expands symmetrically. Lungs are clear to auscultation, no crackles or wheezes. Heart:  Normal rate and regular rhythm. S1 and S2 normal without gallop,  murmur, click, rub . S4 with slurring Abdomen:  Bowel sounds positive,abdomen soft and non-tender without masses, organomegaly or hernias noted. Pulses:  R and L carotid,radial,dorsalis pedis and posterior tibial pulses are full and equal bilaterally Extremities:  No clubbing, cyanosis, edema. Good nail health  Neurologic:  alert & oriented X3.   Skin:  Intact without suspicious lesions or rashes Psych:  memory intact for recent and remote, normally interactive, and good eye contact.     Impression & Recommendations:  Problem # 1:  DIABETES MELLITUS, TYPE II (ICD-250.00) good control His updated medication list for this problem includes:    Metformin Hcl 500 Mg Tb24 (Metformin hcl) .Marland Kitchen... 1 two times a day with 2 largest meals    Glimepiride 2 Mg Tabs (Glimepiride) .Marland Kitchen... 1/2 qd  Problem # 2:  HYPERTENSION, ESSENTIAL NOS (ICD-401.9) controlled His updated medication list for this problem includes:    Metoprolol Tartrate 50 Mg Tabs (Metoprolol tartrate) .Marland Kitchen... 1/2 tab bid  Problem # 3:  HYPERLIPIDEMIA (ICD-272.2) lipids @ goal His updated medication list for this problem includes:    Simvastatin 40 Mg Tabs (Simvastatin) .Marland Kitchen... 1 at bedtime  Problem # 4:  HYPERPLASIA PROSTATE UNS W/O UR OBST & OTH LUTS (ICD-600.90)  Complete Medication List: 1)  Metoprolol Tartrate 50 Mg Tabs (Metoprolol tartrate) .... 1/2 tab bid 2)  Metformin Hcl 500 Mg Tb24 (Metformin hcl) .Marland Kitchen.. 1 two times a day with 2 largest meals 3)  Glimepiride 2 Mg Tabs (Glimepiride) .... 1/2 qd 4)  Simvastatin 40 Mg Tabs (Simvastatin) .Marland Kitchen.. 1 at bedtime  Patient Instructions: 1)  Follow a low glycemic index & load carb  program  as discussed. 2)  HbgA1C prior to visit, ICD-9:250.00 3)  Please schedule a follow-up appointment in 6 months. Prescriptions: SIMVASTATIN 40 MG TABS (SIMVASTATIN) 1 at bedtime  #90 x 3   Entered and Authorized by:   Unice Cobble MD   Signed by:   Unice Cobble MD on 04/06/2009   Method used:    Print then Give to Patient   RxID:   [id number] GLIMEPIRIDE 2 MG  TABS (GLIMEPIRIDE) 1/2 qd  #90 Tablet x 1   Entered and Authorized by:   Unice Cobble MD   Signed by:   Unice Cobble MD on 04/06/2009   Method used:   Print then Give to Patient   RxID:   [id number] METFORMIN HCL 500 MG  TB24 (METFORMIN HCL) 1 two times a day with 2 largest meals  #180 Tablet x 3   Entered and Authorized by:   Unice Cobble MD   Signed by:   Unice Cobble MD on 04/06/2009   Method used:   Print then Give to Patient   RxID:   [id number] METOPROLOL TARTRATE 50 MG  TABS (METOPROLOL TARTRATE) 1/2 tab bid  #90 Tablet x 3   Entered and Authorized by:   Unice Cobble MD   Signed by:   Unice Cobble MD on 04/06/2009   Method used:   Print then Give to Patient   RxID:   [id number]

## 2011-01-28 ENCOUNTER — Telehealth (INDEPENDENT_AMBULATORY_CARE_PROVIDER_SITE_OTHER): Payer: Self-pay | Admitting: *Deleted

## 2011-02-06 NOTE — Progress Notes (Signed)
Summary: Simvastatin----new pharmacy  Phone Note Refill Request Call back at Home Phone [phone] Message from:  Patient on January 28, 2011 2:40 PM  Refills Requested: Medication #1:  SIMVASTATIN 40 MG TABS 1 at bedtime no longer using mail order---wants to use [facility] on [address], [place], [place]---patient gave ph# = [phone]       *****NOTE****** patient still requests a 90 day prescription with refills  Next Appointment Scheduled: none Initial call taken by: Berneta Sages,  January 28, 2011 2:42 PM    Prescriptions: SIMVASTATIN 40 MG TABS (SIMVASTATIN) 1 at bedtime  #90 Tablet x 0   Entered by:   Georgette Dover CMA   Authorized by:   Unice Cobble MD   Signed by:   Georgette Dover CMA on 01/28/2011   Method used:   Electronically to        [facility] [phone]* (retail)       [address], [place]  [address]       Ph: [phone]       Fax: [phone]   RxID:   [id number]

## 2011-03-21 ENCOUNTER — Other Ambulatory Visit: Payer: Self-pay | Admitting: Internal Medicine

## 2011-03-29 ENCOUNTER — Other Ambulatory Visit: Payer: Self-pay | Admitting: Internal Medicine

## 2011-04-30 NOTE — Assessment & Plan Note (Signed)
[place] OFFICE NOTE   ETHANIEL, STUVER                      MRN:          [id number]  DATE:10/21/2008                            DOB:          16-Jun-1934    Mr. Ulch is a delightful 77 year old patient referred by Dr. Linna Darner  for chest pain.  I have actually seen Mr. Biehl in the past.  I had  followed him for many years, but he stopped coming to see me back in  2005.  I used to do all of his aviation, treadmills, and nuclear  studies.   Earney is an interesting gentleman.  He continues to Clinical biochemist.  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  He had coronary  artery bypass surgery back in 1997.  As far as I can tell this included  a LIMA to the LAD, vein graft to the diagonal, and vein graft to the OM  and PDA.   He had never had problems when I have been following up through 2005;  however, he clearly has been having classic angina over the last month.  He describes a pressure in his chest similar to his prebypass pain,  which occurs with stereotypical amount of exercise.  It goes away  quickly with rest.  He has nitroglycerin, which is old and he not taking  any.  He has not had any prolonged episodes.  He gets a little  lightheaded with it, but there is no associated shortness of breath or  diaphoresis.  He has not had any significant palpitations.  He has been  compliant with his meds.   His past medical history is otherwise remarkable for hypertension,  hypercholesterolemia, diabetes, and coronary bypass surgery in 1997.   He also has had a left hernia repair.   He denies an allergy to x-ray dye.  He is allergic to CODEINE.   His current medications include:  1. Lopressor 25 b.i.d., which we will decrease to 25 once a day since      he is so bradycardic.  2. An aspirin a day.  3. Multivitamins.  4.  Pravachol 40 b.i.d.  5. Glimepiride 1 mg a day.  6. Metformin 500 b.i.d.   Family history is remarkable for father dying of lung cancer.  Mother  still being alive.   REVIEW OF SYSTEMS:  Remarkable for some arthritis in his knees, ankles,  and hands.  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  He is happily married.  He has 4 kids.  He does not  drink or smoke.  He tries to stay active and has a low-salt, low-fried  food diet.  He continues to Clinical biochemist.   He is on aspirin a day, multivitamins, Pravachol 40 b.i.d., Lopressor 25  once a day, Vytorin 10/20, not on Pravachol, glimepiride,  metformin,  see above list.   PHYSICAL EXAMINATION:  GENERAL:  Remarkable for an elderly white male in  no distress.  VITAL SIGNS:  Pulse is 49, blood pressure is 130/68.  HEENT:  Unremarkable.  NECK:  Carotids normal without bruit.  No lymphadenopathy, thyromegaly,  or JVP elevation.  LUNGS:  Clear diaphragmatic motion.  No wheezing.  CARDIAC:  S1 and S2.  Normal heart sounds.  PMI normal.  ABDOMEN:  Benign.  Bowel sounds normal.  No AAA.  No tenderness, no  bruit, no hepatosplenomegaly, no hepatojugular reflux, or tenderness,  status post left inguinal hernia repair.  EXTREMITIES:  Distal pulses are intact.  No edema.  NEUROLOGIC:  Nonfocal.  SKIN:  Warm and dry.  No muscular weakness.   EKG is normal.   IMPRESSION:  1. Coronary artery bypass grafting in 1997, left internal mammary      artery graft to the left anterior descending coronary artery, vein      graft to diagonal, vein graft to the obtuse marginal coronary      artery and posterior descending coronary artery, having classic      angina.  Add Plavix.  We will give 300 mg tonight and tomorrow and      then 75 mg on Sunday and Monday morning.  We will arrange for him      to have a JV cath with Dr. Burt Knack and Dr. Olevia Perches Monday morning.  2. Relative bradycardia.  Despite his angina, I think  his heart rate      is too low.  We will cut back his metoprolol to 25 mg in the      morning and monitor.  I do not want him to be too bradycardic      during his catheterization.  3. Hypercholesterolemia.  Continue Vytorin.  Lipid and liver profile      per Dr. Linna Darner.  4. Diabetes.  Hold metformin prior to cath.  Hemoglobin A1c quarterly      per Dr. Linna Darner.  Risks and benefits of cath were discussed with the      patient.  He is willing to proceed.     Wallis Bamberg. Johnsie Cancel, MD, [facility]  Electronically Signed    PCN/MedQ  DD: 10/21/2008  DT: 10/21/2008  Job #: [id number]

## 2011-04-30 NOTE — Assessment & Plan Note (Signed)
[place] OFFICE NOTE   BRONN, ALTIDOR                      MRN:          [id number]  DATE:11/07/2008                            DOB:          01/07/1934    HISTORY OF PRESENT ILLNESS:  Arthas returns today for followup.  Last  time, I saw him he was complaining of exertional chest pain, which  sounded anginal.  I set him up to have a heart catheterization, which  was done by Dr. Olevia Perches on October 24, 2008, at that time all of his  grafts were widely patent.  There was a small marginal branch, which was  not directly revascularized, but Dr. Olevia Perches felt that this was stable,  in short he was surprised at how good his revascularization looked.  He  had normal LV function and good filling dynamics.   I am not sure why Mariel would have pressure in his chest.  He says it is  related to going up a hill or a grade as long as he paces himself, he  does fine, clearly he is at low risk for heart attack, given his fairly  clean angiography.  We will continue him on low-dose beta-blocker.  There is no reason for him to continue Plavix at this time.   REVIEW OF SYSTEMS:  Otherwise negative.   MEDICATIONS:  He is on;  1. Lopressor 25 b.i.d.  2. An aspirin a day.  3. Multivitamins.  4. Pravachol 40 a day.  5. Glimepiride 1 mg a day.  6. Metformin 500 b.i.d.   His sugars had been running a little high, and he has cheated a little  bit with his diet.  I explained to him the importance of maintaining a  hemoglobin A1c less 6.5.   PHYSICAL EXAMINATION:  VITAL SIGNS:  Remarkable for a healthy-appearing,  elderly white male, in no distress.  His weight is 191, blood pressure  127/77, pulse 58 and regular, respiratory rate 14, afebrile.  HEENT:  Unremarkable.  NECK:  Carotids are normal without bruit.  No lymphadenopathy,  thyromegaly, or JVP elevation.  LUNGS:  Clear.  Good diaphragmatic motion.  No wheezing.  S1 and  S2 with  normal heart sounds.  PMI normal.  ABDOMEN:  Benign.  Bowel sounds positive.  No AAA, no tenderness, no  bruit, no hepatosplenomegaly, no hepatojugular reflex or tenderness.  He  does have a bit of an ecchymosis on the right groin at his cath site,  but no bruit.  The patient says it is improving.  EXTREMITIES:  No edema.  PTs +2 bilaterally.  MUSCULOSKELETAL:  No muscular weakness.  NEUROLOGIC:  Nonfocal.   IMPRESSION:  1. Chest pain, but fairly clean angiography with patent 77 year old      bypass grafts.  Continue aspirin and beta-blocker.  2. Hypercholesterolemia, continue statin, lipid, and liver profile in      6 months.  3. Diabetes improve dietary restrictions.  Hemoglobin A1c quarterly.      Follow up with Dr. Linna Darner.  Continue metformin and glimepiride.     Wallis Bamberg. Johnsie Cancel,  MD, [facility]  Electronically Signed    PCN/MedQ  DD: 11/07/2008  DT: 11/08/2008  Job #: [id number]   cc:   Darrick Penna. Linna Darner, MD,FACP,FCCP

## 2011-04-30 NOTE — Cardiovascular Report (Signed)
NAME:  HARLAN, MATHES               ACCOUNT NO.:  [id number]   MEDICAL RECORD NO.:  [id number]          PATIENT TYPE:  OIB   LOCATION:  1962                         FACILITY:  [facility]   PHYSICIAN:  Vanna Scotland. Olevia Perches, MD, FACCDATE OF BIRTH:  10/25/1934   DATE OF PROCEDURE:  10/24/2008  DATE OF DISCHARGE:  10/24/2008                            CARDIAC CATHETERIZATION   CLINICAL HISTORY:  Mr. Glisan is a 77 year old and had bypass surgery  in 1997.  He has done well since that time but over the last month has  developed exertional chest tightness.  He was seen by Dr. Johnsie Cancel in  consultation scheduled for further evaluation with angiography.  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   PROCEDURE:  The procedure was followed via the right femoral artery and  arterial sheath and 4-French preformed coronary catheters.  A front wall  arterial puncture was formed and Omnipaque contrast was used.  Left  bypass graft catheter was used for injection of the vein graft to the  diagonal branch of the LAD.  The patient tolerated the procedure well  and left the laboratory in satisfactory condition.   PATHOLOGY RESULTS:  The left main coronary artery.  The left main  coronary artery is completely occluded in its distal portion.   The right coronary artery.  The right coronary artery was completely  occluded near its origin.   The saphenous vein graft to the acute margin and posterior descending  branch of the right coronary artery was patent and functioned normally.  There were only minimal luminal irregularities.   The saphenous vein graft to the marginal and posterolateral branch of  the circumflex artery was patent and functioned normally with only  minimal luminal irregularities.  There was a first marginal branch that  filled retrograde and there was 95% in the AV circumflex artery before  the takeoff of that graft.   The saphenous vein  graft to the diagonal branch of the LAD was patent  and functioned normally.   The LIMA graft to the LAD was patent and functioned normally.   The left ventriculogram showed good wall motion with no areas of  hypokinesis.  The estimated ejection fraction was 60%.   The aortic pressure was 131/53 with a mean of 82 and left ventricular  pressure was 131/80.   CONCLUSION:  1. Coronary artery status post coronary bypass graft surgery in 1997.  2. 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  3. Patent sequential vein graft to the marginal and posterior      descending branches of the right coronary artery, patent sequential      vein graft to the marginal and posterolateral branch of the      circumflex artery, patent vein graft to the diagonal branch of the      LAD, and patent LIMA graft to the LAD.  4. Normal LV function.   RECOMMENDATIONS:  All six grafts are patent and  they all look well with  only minimal luminal irregularities despite the fact that they are 12  years out since bypass surgery.  The only potential source of ischemia  was the first marginal branch which was not grafted.  I will try and  look at the old films and see if this is changed.  We will plan  reassurance and continued medical therapy.      Bruce Alfonso Patten Olevia Perches, MD, [facility]  Electronically Signed     BRB/MEDQ  D:  10/24/2008  T:  10/24/2008  Job:  [id number]   cc:   Darrick Penna. Linna Darner, MD,FACP,FCCP  Wallis Bamberg. Johnsie Cancel, MD, [facility]  Cardiopulmonary Laboratory

## 2011-05-03 NOTE — Op Note (Signed)
Ethan Mcneil, Ethan Mcneil               ACCOUNT NO.:  [id number]   MEDICAL RECORD NO.:  [id number]          PATIENT TYPE:  OIB   LOCATION:  2899                         FACILITY:  [facility]   PHYSICIAN:  Earnstine Regal, MD      DATE OF BIRTH:  January 08, 1934   DATE OF PROCEDURE:  04/01/2005  DATE OF DISCHARGE:                                 OPERATIVE REPORT   PREOPERATIVE DIAGNOSIS:  Left inguinal hernia.   POSTOPERATIVE DIAGNOSIS:  Left inguinal hernia.   PROCEDURE:  Repair left inguinal hernia with Prolene mesh.   SURGEON:  Earnstine Regal, MD   ANESTHESIA:  General.   ESTIMATED BLOOD LOSS:  Minimal.   PREPARATION:  Betadine.   COMPLICATIONS:  None.   INDICATIONS:  The patient is a 77 year old white male referred by Dr.  Unice Cobble for evaluation of left inguinal hernia. This had been present  for approximately 6 months. The patient had noted discomfort in the left  groin and a progressive bulge.  Dr. Linna Darner confirmed the diagnosis. The  patient was referred for repair.   DESCRIPTION OF PROCEDURE:  The procedure was done in OR #1 at the [place]  [facility]. The patient was brought to the operating room,  placed in supine position on the operating room table. Following  administration of general anesthesia the patient was prepped and draped in  usual strict aseptic fashion. After ascertaining that an adequate level of  anesthesia been obtained, the left inguinal incision was made with a #10  blade. Dissection was carried down through subcutaneous tissues. Hemostasis  was obtained with electrocautery. External oblique fascia is incised in line  with its fibers and extended through the external inguinal ring. Cord  structures were dissected free from the inguinal canal. Cord structures and  ilioinguinal nerve were encircled with a Penrose drain. Floor the inguinal  canal was defined. There was a large direct inguinal hernia. Hernia sac is  dissected away from the cord  structures down to the level of the inguinal  floor. It was then reduced and held in reduction with interrupted 2-0 silk  sutures. Floor of the inguinal canal was then recreated with a sheet of  Prolene mesh. Mesh is cut to the appropriate dimensions. It is secured to  the pubic tubercle along the inguinal ligament with a running 2-0 Novofil  suture. Mesh was split to accommodate the cord structures and ilioinguinal  nerve. Superior margin of the mesh is secured to the transversalis and  internal oblique fascia with interrupted 2-0 Novofil sutures. Tails of the  mesh were overlapped lateral to the cord structures and secured to the  inguinal ligament with interrupted 2-0 Novofil sutures. Good hemostasis was  noted. Local field block is placed with Marcaine. Cord structures and  ilioinguinal nerve were returned to the inguinal canal. External oblique  fascia was closed with interrupted 3-0 Vicryl sutures. Subcutaneous tissues  were closed with interrupted 3-0 Vicryl sutures. Skin was anesthetized with  local anesthetic. Skin was closed  with a running 4-0 Vicryl subcuticular suture. Wound is washed and dried.  Benzoin  and Steri-Strips were applied. Sterile dressings were applied. The  patient was awakened from anesthesia and brought to the recovery room in  stable condition. The patient tolerated the procedure well.      TMG/MEDQ  D:  04/01/2005  T:  04/01/2005  Job:  [id number]   cc:   Darrick Penna. Linna Darner, M.D. [facility]

## 2011-05-03 NOTE — Cardiovascular Report (Signed)
[place]  Patient:    Ethan Mcneil, Ethan Mcneil Visit Number: [id number] MRN: [id number]          Service Type: CAT Location: [facility] 2899 15 Attending Physician:  Bosie Clos Dictated by:   Wallis Bamberg Johnsie Cancel, M.D. [facility] Proc. Date: 11/04/01 Admit Date:  11/04/2001   CC:         Darrick Penna. Linna Darner, M.D. [facility]   Cardiac Catheterization  PROCEDURE PERFORMED: Coronary arteriography.  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 He had a borderline positive Cardiolite study with a question of mild inferior wall ischemia versus diaphragmatic attenuation. The study was done to assess old bypass grafts and possibility of ischemic substrate.  CORONARY ARTERIOGRAPHY: Left main coronary artery: The left main coronary artery had a 60% ostial stenosis.  Left anterior descending artery: The left anterior descending artery had 40% multiple discrete lesions proximally. There was marked competitive flow in the mid and distal vessel from patent grafts.  Circumflex coronary artery: The circumflex coronary artery was 100% occluded.  Right coronary artery: The right coronary artery was 100% occluded proximally with faint bridging collaterals to the RV branch.  The saphenous vein graft to the obtuse marginal branches 1, 2, 3, and 4 were widely patent. The saphenous vein graft to the right coronary artery was widely patent without significant disease. The distal PDA and posterolateral branches were 100% occluded and looked chronic. The saphenous vein graft to the diagonal branch was widely patent and filled the LAD retrograde.  The left internal mammary artery was widely patent to the mid and distal LAD with competitive flow from the native artery and retrograde flow from the diagonal branch graft.  RIGHT ANTERIOR OBLIQUE VENTRICULOGRAPHY: The RAO ventriculography was essentially normal with minimal inferior basal wall hypokinesis. The  ejection fraction was excellent at 60%. There was no gradient across the aortic valve and no MR. Aortic pressure was 136/63. LV pressure was 135/5.  IMPRESSION: The patients coronary arteries look quite pristine for their age. He has no significant ischemic substrate with normal left ventricular function. 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 This would indicate a false-positive Cardiolite study from diaphragmatic attenuation. The patient will be discharged later today so long as his groin heals well. Dictated by:   Wallis Bamberg Johnsie Cancel, M.D. [place] Attending Physician:  Bosie Clos DD:  11/04/01 TD:  11/04/01 Job: [id number]

## 2011-05-08 ENCOUNTER — Other Ambulatory Visit: Payer: Self-pay | Admitting: Internal Medicine

## 2011-05-08 NOTE — Telephone Encounter (Signed)
Lipid/Hep 272.4/995.20

## 2011-06-13 ENCOUNTER — Encounter: Payer: Self-pay | Admitting: Internal Medicine

## 2011-06-20 ENCOUNTER — Encounter: Payer: Self-pay | Admitting: Cardiovascular Disease

## 2011-07-17 ENCOUNTER — Ambulatory Visit (AMBULATORY_SURGERY_CENTER): Payer: Medicare Other | Admitting: *Deleted

## 2011-07-17 VITALS — Ht 70.0 in | Wt 185.0 lb

## 2011-07-17 DIAGNOSIS — Z1211 Encounter for screening for malignant neoplasm of colon: Secondary | ICD-10-CM

## 2011-07-17 MED ORDER — PEG-KCL-NACL-NASULF-NA ASC-C 100 G PO SOLR: ORAL | Status: DC

## 2011-07-21 ENCOUNTER — Other Ambulatory Visit: Payer: Self-pay | Admitting: Internal Medicine

## 2011-07-22 NOTE — Telephone Encounter (Signed)
A1c 250.00 and follow-up appointment after labs

## 2011-07-24 ENCOUNTER — Other Ambulatory Visit: Payer: Self-pay | Admitting: Internal Medicine

## 2011-08-09 ENCOUNTER — Other Ambulatory Visit: Payer: Self-pay | Admitting: Internal Medicine

## 2011-08-09 NOTE — Telephone Encounter (Signed)
Lipid/Hep 272.4/995.20

## 2011-08-15 ENCOUNTER — Encounter: Payer: Self-pay | Admitting: Internal Medicine

## 2011-08-15 ENCOUNTER — Ambulatory Visit (AMBULATORY_SURGERY_CENTER): Payer: Medicare Other | Admitting: Internal Medicine

## 2011-08-15 VITALS — BP 124/60 | HR 47 | Temp 96.0°F | Resp 15 | Ht 70.0 in | Wt 185.0 lb

## 2011-08-15 DIAGNOSIS — D126 Benign neoplasm of colon, unspecified: Secondary | ICD-10-CM

## 2011-08-15 DIAGNOSIS — K5730 Diverticulosis of large intestine without perforation or abscess without bleeding: Secondary | ICD-10-CM

## 2011-08-15 DIAGNOSIS — Z1211 Encounter for screening for malignant neoplasm of colon: Secondary | ICD-10-CM

## 2011-08-15 LAB — GLUCOSE, CAPILLARY
Glucose-Capillary: 110 mg/dL — ABNORMAL HIGH (ref 70–99)
Glucose-Capillary: 121 mg/dL — ABNORMAL HIGH (ref 70–99)

## 2011-08-15 MED ORDER — SODIUM CHLORIDE 0.9 % IV SOLN: 500.0000 mL | INTRAVENOUS | Status: DC

## 2011-08-15 NOTE — Patient Instructions (Signed)
Discharge instructions given with verbal understanding. Handouts on polyps,diverticulosis,hemorroids given. Resume previous medications.

## 2011-08-16 ENCOUNTER — Telehealth: Payer: Self-pay | Admitting: *Deleted

## 2011-08-16 NOTE — Telephone Encounter (Signed)

## 2011-08-29 ENCOUNTER — Other Ambulatory Visit: Payer: Self-pay | Admitting: Internal Medicine

## 2011-09-10 ENCOUNTER — Other Ambulatory Visit: Payer: Self-pay | Admitting: Internal Medicine

## 2011-09-17 LAB — POCT I-STAT GLUCOSE
Glucose, Bld: 102 — ABNORMAL HIGH
Operator id: 141321

## 2011-09-25 ENCOUNTER — Other Ambulatory Visit: Payer: Self-pay | Admitting: Internal Medicine

## 2011-10-28 ENCOUNTER — Encounter: Payer: Self-pay | Admitting: Internal Medicine

## 2011-10-29 ENCOUNTER — Ambulatory Visit (INDEPENDENT_AMBULATORY_CARE_PROVIDER_SITE_OTHER): Payer: Medicare Other | Admitting: Internal Medicine

## 2011-10-29 ENCOUNTER — Encounter: Payer: Self-pay | Admitting: Internal Medicine

## 2011-10-29 VITALS — BP 142/74 | HR 55 | Temp 98.3°F | Resp 14 | Ht 70.0 in | Wt 187.0 lb

## 2011-10-29 DIAGNOSIS — Z23 Encounter for immunization: Secondary | ICD-10-CM

## 2011-10-29 DIAGNOSIS — Z0000 Encounter for general adult medical examination without abnormal findings: Secondary | ICD-10-CM

## 2011-10-29 DIAGNOSIS — Z136 Encounter for screening for cardiovascular disorders: Secondary | ICD-10-CM

## 2011-10-29 DIAGNOSIS — M109 Gout, unspecified: Secondary | ICD-10-CM

## 2011-10-29 DIAGNOSIS — D126 Benign neoplasm of colon, unspecified: Secondary | ICD-10-CM

## 2011-10-29 DIAGNOSIS — E782 Mixed hyperlipidemia: Secondary | ICD-10-CM

## 2011-10-29 DIAGNOSIS — N400 Enlarged prostate without lower urinary tract symptoms: Secondary | ICD-10-CM

## 2011-10-29 DIAGNOSIS — E119 Type 2 diabetes mellitus without complications: Secondary | ICD-10-CM

## 2011-10-29 DIAGNOSIS — I10 Essential (primary) hypertension: Secondary | ICD-10-CM

## 2011-10-29 DIAGNOSIS — I2510 Atherosclerotic heart disease of native coronary artery without angina pectoris: Secondary | ICD-10-CM

## 2011-10-29 NOTE — Patient Instructions (Signed)
Preventive Health Care: Exercise at least 30-45 minutes a day,  3-4 days a week.  Eat a low-fat diet with lots of fruits and vegetables, up to 7-9 servings per day. Consume less than 40 grams of sugar per day from foods & drinks with High Fructose Corn Sugar as # 1,2,3 or # 4 on label.  

## 2011-10-29 NOTE — Progress Notes (Signed)
Subjective:    Patient ID: Ethan Mcneil, male    DOB: 1933/12/24, 77 y.o.   MRN: [id number]  HPI Medicare Wellness Visit:  The following psychosocial & medical history were reviewed as required by Medicare.   Social history: caffeine: 3 cups , alcohol:  no ,  tobacco use : never  & exercise : walking 30 min 5X/week.   Home & personal  safety / fall risk: no issues, activities of daily living: no limitations , seatbelt use : yes , and smoke alarm employment : yes .  Power of Attorney/Living Will status : in place  Vision ( as recorded per Nurse) & Hearing  evaluation :  Last Ophth exam 10/12 for foreign body. Wall chart read & whisper heard @ 6 ft. Orientation :oriented X 3 , memory & recall :good ,  math testing: good,and mood & affect : normal . Depression / anxiety: no issues  Travel history : 1980s [place] , immunization status :Shingles needed , transfusion history:  no, and preventive health surveillance ( colonoscopies, BMD , etc as per protocol/ [facility]): colonoscopy 2012: polyps, Dental care:  Seen every 6 mos . Chart reviewed &  Updated. Active issues reviewed & addressed.       Review of Systems HYPERTENSION: Disease Monitoring: Blood pressure range-not checked  Chest pain, palpitations- no       Dyspnea- no Medications: Compliance- yes  Lightheadedness,Syncope- no    Edema- no  DIABETES: Disease Monitoring: Blood Sugar ranges-FBS 98-140  Polyuria/phagia/dipsia- no       Visual problems- no Medications: Compliance- yes  Hypoglycemic symptoms- no  HYPERLIPIDEMIA: Medications: Compliance- yes  Abd pain, bowel changes- no   Muscle aches- no   This Summer he had tick fever;  he questions an acute attack of gout after this.  He has a past history of prostatic hypertrophy. A maternal uncle may have had prostate cancer in his 77s. He denies hematuria, pyuria or dysuria. He also has no voiding or incontinence problems. His PSA has ranged from 0.6 in 2001 to a high of  1.29 in 2006. The PSA was 1.12-2005.         Objective:   Physical Exam Gen.: Healthy and well-nourished in appearance. Alert, appropriate and cooperative throughout exam. Head: Normocephalic without obvious abnormalities;  pattern alopecia  Eyes: No corneal or conjunctival inflammation noted. Pupils equal round reactive to light and accommodation. Fundal exam is benign without hemorrhages, exudate, papilledema. Extraocular motion intact. Ears: External  ear exam reveals no significant lesions or deformities. Canals clear .TMs normal. Nose: External nasal exam reveals no deformity or inflammation. Nasal mucosa are pink and moist. No lesions or exudates noted.  Mouth: Oral mucosa and oropharynx reveal no lesions or exudates. Teeth in good repair; lower partial. Neck: No deformities, masses, or tenderness noted. Range of motion &  Thyroid  normal. Lungs: Normal respiratory effort; chest expands symmetrically. Lungs are clear to auscultation without rales, wheezes, or increased work of breathing. Heart: Slow regular  rhythm. Normal S1 and S2. No gallop, click, or rub. Grade 1 systolic  murmur. Abdomen: Bowel sounds normal; abdomen soft and nontender. No masses, organomegaly or hernias noted. Genitalia/DRE: Genital exam is unremarkable. There is asymmetry of the prostate with the right lobe being slightly larger than the left. There is no nodularity or induration. Overall size is not significantly increased   .  Musculoskeletal/extremities: No deformity or scoliosis noted of  the thoracic or lumbar spine. No clubbing, cyanosis, edema, or deformity noted. Range of motion  normal .Tone & strength  normal.Joints normal. Nail health  good. Vascular: Carotid, radial artery, dorsalis pedis and  posterior tibial pulses are full and equal. No bruits present. Neurologic: Alert and oriented x3. Deep tendon reflexes symmetrical and  normal.          Skin: Intact without suspicious lesions or rashes. Lymph: No cervical, axillary, or inguinal lymphadenopathy present. Psych: Mood and affect are normal. Normally interactive                                                                                         Assessment & Plan:  #1 Medicare Wellness Exam; criteria met ; data entered #2 Problem List reviewed ; Assessment/ Recommendations made Plan: see Orders

## 2011-10-30 LAB — CBC WITH DIFFERENTIAL/PLATELET
Basophils Absolute: 0 10*3/uL (ref 0.0–0.1)
Basophils Relative: 0.3 % (ref 0.0–3.0)
Eosinophils Absolute: 0.5 10*3/uL (ref 0.0–0.7)
Eosinophils Relative: 7.5 % — ABNORMAL HIGH (ref 0.0–5.0)
HCT: 40.9 % (ref 39.0–52.0)
Hemoglobin: 13.7 g/dL (ref 13.0–17.0)
Lymphocytes Relative: 35.9 % (ref 12.0–46.0)
Lymphs Abs: 2.2 10*3/uL (ref 0.7–4.0)
MCHC: 33.4 g/dL (ref 30.0–36.0)
MCV: 90.3 fl (ref 78.0–100.0)
Monocytes Absolute: 0.3 10*3/uL (ref 0.1–1.0)
Monocytes Relative: 4.3 % (ref 3.0–12.0)
Neutro Abs: 3.2 10*3/uL (ref 1.4–7.7)
Neutrophils Relative %: 52 % (ref 43.0–77.0)
Platelets: 207 10*3/uL (ref 150.0–400.0)
RBC: 4.53 Mil/uL (ref 4.22–5.81)
RDW: 14.2 % (ref 11.5–14.6)
WBC: 6.1 10*3/uL (ref 4.5–10.5)

## 2011-10-30 LAB — URIC ACID: Uric Acid, Serum: 5.3 mg/dL (ref 4.0–7.8)

## 2011-10-30 LAB — BASIC METABOLIC PANEL
BUN: 14 mg/dL (ref 6–23)
CO2: 27 mEq/L (ref 19–32)
Calcium: 9.3 mg/dL (ref 8.4–10.5)
Chloride: 101 mEq/L (ref 96–112)
Creatinine, Ser: 1 mg/dL (ref 0.4–1.5)
GFR: 77.37 mL/min (ref >60.00)
Glucose, Bld: 87 mg/dL (ref 70–99)
Potassium: 4.7 mEq/L (ref 3.5–5.1)
Sodium: 139 mEq/L (ref 135–145)

## 2011-10-30 LAB — HEPATIC FUNCTION PANEL
ALT: 30 U/L (ref 0–53)
AST: 26 U/L (ref 0–37)
Albumin: 4.4 g/dL (ref 3.5–5.2)
Alkaline Phosphatase: 51 U/L (ref 39–117)
Bilirubin, Direct: 0.1 mg/dL (ref 0.0–0.3)
Total Bilirubin: 0.7 mg/dL (ref 0.3–1.2)
Total Protein: 7.2 g/dL (ref 6.0–8.3)

## 2011-10-30 LAB — LIPID PANEL
Cholesterol: 100 mg/dL (ref 0–200)
HDL: 42.1 mg/dL (ref >39.00)
LDL Cholesterol: 45 mg/dL (ref 0–99)
Total CHOL/HDL Ratio: 2
Triglycerides: 66 mg/dL (ref 0.0–149.0)
VLDL: 13.2 mg/dL (ref 0.0–40.0)

## 2011-10-30 LAB — TSH: TSH: 5.67 u[IU]/mL — ABNORMAL HIGH (ref 0.35–5.50)

## 2011-11-13 ENCOUNTER — Other Ambulatory Visit: Payer: Self-pay | Admitting: *Deleted

## 2011-11-13 MED ORDER — METFORMIN HCL 500 MG PO TABS: 500.0000 mg | ORAL_TABLET | Freq: Two times a day (BID) | ORAL | Status: DC

## 2011-11-13 NOTE — Telephone Encounter (Signed)
Rx sent 

## 2011-11-18 ENCOUNTER — Other Ambulatory Visit: Payer: Self-pay | Admitting: Internal Medicine

## 2011-11-18 DIAGNOSIS — R946 Abnormal results of thyroid function studies: Secondary | ICD-10-CM

## 2011-11-19 ENCOUNTER — Other Ambulatory Visit (INDEPENDENT_AMBULATORY_CARE_PROVIDER_SITE_OTHER): Payer: Medicare Other

## 2011-11-19 DIAGNOSIS — R946 Abnormal results of thyroid function studies: Secondary | ICD-10-CM

## 2011-11-19 LAB — T4, FREE: Free T4: 0.78 ng/dL (ref 0.60–1.60)

## 2011-11-19 LAB — TSH: TSH: 6.42 u[IU]/mL — ABNORMAL HIGH (ref 0.35–5.50)

## 2011-11-19 LAB — T3, FREE: T3, Free: 2.6 pg/mL (ref 2.3–4.2)

## 2011-11-19 NOTE — Progress Notes (Signed)
12  

## 2011-12-25 ENCOUNTER — Ambulatory Visit (INDEPENDENT_AMBULATORY_CARE_PROVIDER_SITE_OTHER): Payer: Medicare Other | Admitting: Internal Medicine

## 2011-12-25 ENCOUNTER — Encounter: Payer: Self-pay | Admitting: Internal Medicine

## 2011-12-25 ENCOUNTER — Other Ambulatory Visit: Payer: Self-pay | Admitting: *Deleted

## 2011-12-25 DIAGNOSIS — R946 Abnormal results of thyroid function studies: Secondary | ICD-10-CM

## 2011-12-25 DIAGNOSIS — E119 Type 2 diabetes mellitus without complications: Secondary | ICD-10-CM | POA: Diagnosis not present

## 2011-12-25 DIAGNOSIS — I10 Essential (primary) hypertension: Secondary | ICD-10-CM

## 2011-12-25 DIAGNOSIS — E782 Mixed hyperlipidemia: Secondary | ICD-10-CM

## 2011-12-25 LAB — MICROALBUMIN / CREATININE URINE RATIO
Creatinine,U: 151.2 mg/dL
Microalb Creat Ratio: 0.4 mg/g (ref 0.0–30.0)
Microalb, Ur: 0.6 mg/dL (ref 0.0–1.9)

## 2011-12-25 LAB — HEMOGLOBIN A1C: Hgb A1c MFr Bld: 7.4 % — ABNORMAL HIGH (ref 4.6–6.5)

## 2011-12-25 MED ORDER — SIMVASTATIN 40 MG PO TABS: 40.0000 mg | ORAL_TABLET | Freq: Every day | ORAL | Status: DC

## 2011-12-25 MED ORDER — LEVOTHYROXINE SODIUM 25 MCG PO TABS: 25.0000 ug | ORAL_TABLET | Freq: Every day | ORAL | Status: DC

## 2011-12-25 MED ORDER — GLUCOSE BLOOD VI STRP: ORAL_STRIP | Status: DC

## 2011-12-25 MED ORDER — METOPROLOL TARTRATE 50 MG PO TABS: 25.0000 mg | ORAL_TABLET | Freq: Two times a day (BID) | ORAL | Status: DC

## 2011-12-25 NOTE — Patient Instructions (Signed)
Blood Pressure Goal  Ideally is an AVERAGE < 135/85. This AVERAGE should be calculated from @ least 5-7 BP readings taken @ different times of day on different days of week. You should not respond to isolated BP readings , but rather the AVERAGE for that week  PLEASE BRING THESE INSTRUCTIONS TO FOLLOW UP  LAB APPOINTMENT in 10 weeks for TSH.This will guarantee correct labs are drawn, eliminating need for repeat blood sampling ( needle sticks ! ). Diagnoses /Codes: A4906176.5

## 2011-12-25 NOTE — Progress Notes (Signed)
  Subjective:    Patient ID: Ethan Mcneil, male    DOB: 1934-06-30, 78 y.o.   MRN: [id number]  HPI #1  Thyroid function monitor: TSH has increased to 6.42; free T4 and free T3 are normal.  Medications status(change in dose/brand/mode of administration):no Constitutional: Weight change: no; Fatigue:no; Sleep pattern:good; Appetite:good Visual change(blurred/diplopia/visual loss):no Hoarseness:no; Swallowing issues:no Cardiovascular: Palpitations:no; Racing:no; Irregularity:no GI: Constipation:no; Diarrhea:no Derm: Change in nails/hair/skin:no Neuro: Numbness/tingling:no; Tremor:no Psych: Anxiety:no; Depression:no; Panic attacks:no Endo: Temperature intolerance: Heat:no; Cold:no  #2 Diabetes status assessment : Fasting or morning glucose range: not checked since early Decemeber Hypoglycemia : no  . ROS: Excess thirst /  hunger  / urination:  no.  Lightheadedness with standing:minimally . Non healing skin  ulcers or sores : no.  Significant change in  Weight : no.  Exercise : walking 1.5 mpd X 5.   Nutrition/diet : no HFCS.                                                                                Medication compliance:  yes. Adverse  Medication effects:  no . Eye exam : 6 mos ago; no retinopathy  Foot care:  no . A1c/ urine microalbumin monitor:  Last A1c on record was 7% on 08/10/10. Glucose was 87 in November of 2012. Renal function was excellent.   #3 hyperlipidemia:  Lipids were at goal: 10/29/11; liver function was also normal. He denies any myalgias.       Review of Systems     Objective:   Physical Exam Gen.: Thin but healthy  & well-nourished, appropriate  Eyes: No lid/conjunctival changes, extraocular motion intact Neck: Normal  thyroid Respiratory: No increased work of breathing or abnormal breath sounds Cardiac : regular rhythm, no extra heart sounds, gallop, murmur Skin: No rashes, lesions, ulcers or ischemic changes Muscle skeletal: no nail changes;  joints normal Vasc:All pulses intact,but decreased DPP. no bruits present Neuro: Normal deep tendon reflexes, alert & oriented, sensation over feet intact Psych: judgment and insight, mood and affect normal       Assessment & Plan:  #1 abnormal thyroid function tests; early hypothyroidism suggested. Low-dose thyroid will be initiated with repeat TSH in 8-10 weeks.

## 2011-12-25 NOTE — Assessment & Plan Note (Signed)
Followup lipids due in November 2013

## 2011-12-25 NOTE — Assessment & Plan Note (Signed)
Blood pressure goals discussed; call monitor recommended.

## 2011-12-25 NOTE — Assessment & Plan Note (Signed)
Recent fasting blood glucose was normal; A1c and urine microalbumin monitor indicated. Renal function has been normal on metformin.

## 2012-01-13 ENCOUNTER — Other Ambulatory Visit: Payer: Self-pay | Admitting: Internal Medicine

## 2012-03-02 ENCOUNTER — Other Ambulatory Visit: Payer: Self-pay | Admitting: Internal Medicine

## 2012-03-04 ENCOUNTER — Other Ambulatory Visit (INDEPENDENT_AMBULATORY_CARE_PROVIDER_SITE_OTHER): Payer: Medicare Other

## 2012-03-04 DIAGNOSIS — E119 Type 2 diabetes mellitus without complications: Secondary | ICD-10-CM | POA: Diagnosis not present

## 2012-03-04 LAB — HEMOGLOBIN A1C: Hgb A1c MFr Bld: 7.4 % — ABNORMAL HIGH (ref 4.6–6.5)

## 2012-04-21 ENCOUNTER — Ambulatory Visit (INDEPENDENT_AMBULATORY_CARE_PROVIDER_SITE_OTHER): Payer: Medicare Other | Admitting: Internal Medicine

## 2012-04-21 ENCOUNTER — Encounter: Payer: Self-pay | Admitting: Internal Medicine

## 2012-04-21 VITALS — BP 128/72 | HR 48 | Resp 14 | Wt 186.0 lb

## 2012-04-21 DIAGNOSIS — E782 Mixed hyperlipidemia: Secondary | ICD-10-CM

## 2012-04-21 DIAGNOSIS — I10 Essential (primary) hypertension: Secondary | ICD-10-CM

## 2012-04-21 DIAGNOSIS — M1990 Unspecified osteoarthritis, unspecified site: Secondary | ICD-10-CM

## 2012-04-21 DIAGNOSIS — E785 Hyperlipidemia, unspecified: Secondary | ICD-10-CM

## 2012-04-21 DIAGNOSIS — R946 Abnormal results of thyroid function studies: Secondary | ICD-10-CM | POA: Diagnosis not present

## 2012-04-21 DIAGNOSIS — E119 Type 2 diabetes mellitus without complications: Secondary | ICD-10-CM

## 2012-04-21 DIAGNOSIS — E039 Hypothyroidism, unspecified: Secondary | ICD-10-CM | POA: Insufficient documentation

## 2012-04-21 LAB — LIPID PANEL
Cholesterol: 113 mg/dL (ref 0–200)
HDL: 43.7 mg/dL (ref >39.00)
LDL Cholesterol: 54 mg/dL (ref 0–99)
Total CHOL/HDL Ratio: 3
Triglycerides: 76 mg/dL (ref 0.0–149.0)
VLDL: 15.2 mg/dL (ref 0.0–40.0)

## 2012-04-21 LAB — AST: AST: 18 U/L (ref 0–37)

## 2012-04-21 LAB — ALT: ALT: 21 U/L (ref 0–53)

## 2012-04-21 LAB — T3, FREE: T3, Free: 2.6 pg/mL (ref 2.3–4.2)

## 2012-04-21 LAB — T4, FREE: Free T4: 0.69 ng/dL (ref 0.60–1.60)

## 2012-04-21 LAB — TSH: TSH: 5.53 u[IU]/mL — ABNORMAL HIGH (ref 0.35–5.50)

## 2012-04-21 MED ORDER — LEVOTHYROXINE SODIUM 25 MCG PO TABS: 25.0000 ug | ORAL_TABLET | Freq: Every day | ORAL | Status: DC

## 2012-04-21 MED ORDER — METOPROLOL TARTRATE 50 MG PO TABS: 25.0000 mg | ORAL_TABLET | Freq: Two times a day (BID) | ORAL | Status: DC

## 2012-04-21 MED ORDER — SIMVASTATIN 40 MG PO TABS: 40.0000 mg | ORAL_TABLET | Freq: Every day | ORAL | Status: DC

## 2012-04-21 NOTE — Progress Notes (Signed)
Subjective:    Patient ID: Ethan Mcneil, male    DOB: [birth year], 78 y.o.   MRN: [id number]  HPI Diabetes status assessment: Fasting or morning glucose range: 120-165 or average :  Not calculated , ? 150  . Highest glucose 2 hours after any meal: not checked. Hypoglycemia :  no .                                                     Excess thirst :no;  Excess hunger:  no ;  Excess urination:  no.                                  Lightheadedness with standing:  seldomly. Chest pain:  no ; Palpitations :no ;  Pain in  calves with walking:  no .                                                                                                                                Non healing skin  ulcers or sores,especially over the feet: no. Numbness or tingling or burning in feet : no .                                                                                                                                              Significant change in  Weight : stable. Vision changes : no  .                                                                    Exercise :  walking minimally due to arthritis (see below) . Nutrition/diet:  no. Medication compliance : yes. Medication adverse  Effects:  no . Eye exam : 12 mos ago, no retinopathy. Foot care : no.  A1c/ urine microalbumin monitor:  7.4 % ; it was 7% on 08/10/10  Review of Systems Extremity pain Location:knees & LS spine Onset:stable for years,exercise dictated by weather Trigger/injury:no injury or surgery Pain quality:aching Pain severity:up to 4 Duration:hours Radiation:no (no radiculopathy) Exacerbating factors:work & walking Treatment/response:ASA 81 mg  2/ day & as neded Aleve with benefit Review of systems: Constitutional: no fever, chills. Occasional night sweats Musculoskeletal:Occasional  muscle cramps ; no  joint  redness or swelling Skin:no rash, color change Neuro: no weakness; incontinence (stool/urine); numbness and  tingling Heme:no lymphadenopathy; abnormal bruising or bleeding  Abnormal TFTs: TSH 6.42on 11/19/11; FreeT3 & free T4 normal          Objective:   Physical Exam Gen.: Healthy and well-nourished in appearance. Alert, appropriate and cooperative throughout exam. Appears younger than stated age  Eyes: No corneal or conjunctival inflammation noted.  Extraocular motion intact. No lid lag or proptosis  Neck: No deformities, masses, or tenderness noted. Range of motion & Thyroid normal. Lungs: Normal respiratory effort; chest expands symmetrically. Lungs are clear to auscultation without rales, wheezes, or increased work of breathing. Heart: Normal rate and rhythm. Normal S1 and S2. No gallop, click, or rub. S4 with slurring ;no murmur. Abdomen: Bowel sounds normal; abdomen soft and nontender. No masses, organomegaly or hernias noted.                                                                           Musculoskeletal/extremities: No deformity or scoliosis noted of  the thoracic or lumbar spine. No clubbing, cyanosis, edema, or deformity noted. Range of motion  normal .Tone & strength  normal.Joints ;marked crepitus of knees. Nail health :dehisence L great nail distally.Neg SLR; able to lie flat & sit up w/o help Vascular: Carotid, radial artery,  and  posterior tibial pulses are full and equal. No bruits present.decreased DPP Neurologic: Alert and oriented x3. Deep tendon reflexes symmetrical and normal.  Light touch normal over feet.        Skin: Intact without suspicious lesions or rashes. Lymph: No cervical, axillary lymphadenopathy present. Psych: Mood and affect are normal. Normally interactive                                                                                         Assessment & Plan:

## 2012-04-21 NOTE — Assessment & Plan Note (Signed)
Well controlled; no change indicated. BMET WNL 10/29/11

## 2012-04-21 NOTE — Assessment & Plan Note (Signed)
Note: off thyroid X 2 weeks

## 2012-04-21 NOTE — Assessment & Plan Note (Signed)
Minimize NSAIDS because of GI & cardiac risks. AS Tylenol as needed. Note: intolerant to codeine

## 2012-04-21 NOTE — Assessment & Plan Note (Signed)
A1c GOAL:Good diabetic control: 7-8%  Check the A1c every  every 4 months . Goals for home glucose monitoring are : fasting  or morning glucose goal of  120-150. Two hours after any meal , goal = < 180, preferably < 160. Report any low blood glucoses immediately.

## 2012-04-21 NOTE — Patient Instructions (Signed)
Eat a low-fat diet with lots of fruits and vegetables, up to 7-9 servings per day. Consume less than 40 (preferably ZERO) grams of sugar per day from foods & drinks with High Fructose Corn Syrup (HFCS) sugar as #1,2,3 or # 4 on label.Whole [employer], [employer] do not carry products with HFCS. Follow a  low carb nutrition program such as [place] or The New Sugar Busters  to prevent Diabetes progression . White carbohydrates (potatoes, rice, bread, and pasta) have a high spike of sugar and a high load of sugar. For example a  baked potato has a cup of sugar and a  french fry  2 teaspoons of sugar. Yams, wild  rice, whole grained bread &  wheat pasta have been much lower spike and load of  sugar. Portions should be the size of a deck of cards or your palm.  Please try to go on My Chart within the next 24 hours to allow me to release the results directly to you.

## 2012-04-21 NOTE — Assessment & Plan Note (Signed)
LDL 45 on 10/29/11

## 2012-04-23 ENCOUNTER — Other Ambulatory Visit: Payer: Self-pay | Admitting: Internal Medicine

## 2012-04-23 NOTE — Telephone Encounter (Signed)
Refill done.  

## 2012-06-13 ENCOUNTER — Other Ambulatory Visit: Payer: Self-pay | Admitting: Internal Medicine

## 2012-06-15 NOTE — Telephone Encounter (Signed)
Refill done.  

## 2012-08-05 ENCOUNTER — Other Ambulatory Visit: Payer: Self-pay | Admitting: Internal Medicine

## 2012-08-05 NOTE — Telephone Encounter (Signed)
A1C 250.00 

## 2012-09-10 ENCOUNTER — Other Ambulatory Visit: Payer: Self-pay | Admitting: Internal Medicine

## 2012-09-28 ENCOUNTER — Other Ambulatory Visit: Payer: Self-pay | Admitting: Internal Medicine

## 2012-09-28 DIAGNOSIS — E039 Hypothyroidism, unspecified: Secondary | ICD-10-CM

## 2012-10-07 ENCOUNTER — Other Ambulatory Visit: Payer: Self-pay | Admitting: Internal Medicine

## 2012-10-19 ENCOUNTER — Telehealth: Payer: Self-pay | Admitting: Internal Medicine

## 2012-10-19 NOTE — Telephone Encounter (Signed)
Message copied by Bradley Ferris on Mon Oct 19, 2012 12:05 PM ------      Message from: Hendricks Limes      Created: Sun Oct 18, 2012  1:46 PM       Please  schedule fasting Labs & F/U OV : BMET, A1c , urine microalbumin, TSH, free T4. Codes: 794.5, 250.02

## 2012-10-19 NOTE — Telephone Encounter (Signed)
CALLED TO SCHEDULE Per spouse out til late this afternoon call back after 4pm

## 2012-10-20 ENCOUNTER — Other Ambulatory Visit: Payer: Self-pay | Admitting: Internal Medicine

## 2012-10-20 NOTE — Telephone Encounter (Signed)
A1C 250.00 

## 2012-10-23 NOTE — Telephone Encounter (Signed)
called pt to sched - lab appt 11.13.13, OV 11.20.13 both at 8am

## 2012-10-26 ENCOUNTER — Other Ambulatory Visit: Payer: Self-pay | Admitting: Internal Medicine

## 2012-10-28 ENCOUNTER — Other Ambulatory Visit (INDEPENDENT_AMBULATORY_CARE_PROVIDER_SITE_OTHER): Payer: Medicare Other

## 2012-10-28 DIAGNOSIS — IMO0001 Reserved for inherently not codable concepts without codable children: Secondary | ICD-10-CM

## 2012-10-28 DIAGNOSIS — R942 Abnormal results of pulmonary function studies: Secondary | ICD-10-CM

## 2012-10-28 LAB — BASIC METABOLIC PANEL
BUN: 13 mg/dL (ref 6–23)
CO2: 29 mEq/L (ref 19–32)
Calcium: 9.1 mg/dL (ref 8.4–10.5)
Chloride: 100 mEq/L (ref 96–112)
Creatinine, Ser: 0.9 mg/dL (ref 0.4–1.5)
GFR: 87.14 mL/min (ref >60.00)
Glucose, Bld: 197 mg/dL — ABNORMAL HIGH (ref 70–99)
Potassium: 4.3 mEq/L (ref 3.5–5.1)
Sodium: 136 mEq/L (ref 135–145)

## 2012-10-28 LAB — MICROALBUMIN / CREATININE URINE RATIO
Creatinine,U: 183.6 mg/dL
Microalb Creat Ratio: 0.3 mg/g (ref 0.0–30.0)
Microalb, Ur: 0.6 mg/dL (ref 0.0–1.9)

## 2012-10-28 LAB — TSH: TSH: 2.59 u[IU]/mL (ref 0.35–5.50)

## 2012-10-28 LAB — T3, FREE: T3, Free: 2.8 pg/mL (ref 2.3–4.2)

## 2012-10-28 LAB — HEMOGLOBIN A1C: Hgb A1c MFr Bld: 8.7 % — ABNORMAL HIGH (ref 4.6–6.5)

## 2012-10-28 LAB — T4, FREE: Free T4: 1 ng/dL (ref 0.60–1.60)

## 2012-10-29 ENCOUNTER — Other Ambulatory Visit: Payer: Self-pay | Admitting: Internal Medicine

## 2012-10-29 NOTE — Telephone Encounter (Signed)
Rx sent.    MW 

## 2012-11-04 ENCOUNTER — Encounter: Payer: Self-pay | Admitting: Internal Medicine

## 2012-11-04 ENCOUNTER — Ambulatory Visit (INDEPENDENT_AMBULATORY_CARE_PROVIDER_SITE_OTHER): Payer: Medicare Other | Admitting: Internal Medicine

## 2012-11-04 VITALS — BP 122/70 | HR 53 | Temp 97.8°F | Wt 179.4 lb

## 2012-11-04 DIAGNOSIS — J209 Acute bronchitis, unspecified: Secondary | ICD-10-CM

## 2012-11-04 DIAGNOSIS — I10 Essential (primary) hypertension: Secondary | ICD-10-CM

## 2012-11-04 DIAGNOSIS — IMO0001 Reserved for inherently not codable concepts without codable children: Secondary | ICD-10-CM

## 2012-11-04 DIAGNOSIS — Z23 Encounter for immunization: Secondary | ICD-10-CM

## 2012-11-04 DIAGNOSIS — E782 Mixed hyperlipidemia: Secondary | ICD-10-CM

## 2012-11-04 DIAGNOSIS — E039 Hypothyroidism, unspecified: Secondary | ICD-10-CM

## 2012-11-04 MED ORDER — METFORMIN HCL 500 MG PO TABS: 500.0000 mg | ORAL_TABLET | Freq: Two times a day (BID) | ORAL | Status: DC

## 2012-11-04 MED ORDER — RAMIPRIL 2.5 MG PO CAPS: 2.5000 mg | ORAL_CAPSULE | Freq: Every day | ORAL | Status: DC

## 2012-11-04 MED ORDER — LEVOTHYROXINE SODIUM 25 MCG PO TABS: 25.0000 ug | ORAL_TABLET | Freq: Every day | ORAL | Status: DC

## 2012-11-04 MED ORDER — SIMVASTATIN 40 MG PO TABS: 40.0000 mg | ORAL_TABLET | Freq: Every day | ORAL | Status: DC

## 2012-11-04 MED ORDER — GLIMEPIRIDE 2 MG PO TABS: 2.0000 mg | ORAL_TABLET | Freq: Every day | ORAL | Status: DC

## 2012-11-04 MED ORDER — AMOXICILLIN 500 MG PO CAPS: 500.0000 mg | ORAL_CAPSULE | Freq: Three times a day (TID) | ORAL | Status: DC

## 2012-11-04 MED ORDER — METOPROLOL TARTRATE 50 MG PO TABS: ORAL_TABLET | ORAL | Status: DC

## 2012-11-04 NOTE — Assessment & Plan Note (Addendum)
  Lipids were excellent in May/2013. They should be rechecked in May/2014.

## 2012-11-04 NOTE — Progress Notes (Signed)
  Subjective:    Patient ID: Ethan Mcneil, male    DOB: [birth year], 78 y.o.   MRN: [id number]  HPI HYPERTENSION: Disease Monitoring: Blood pressure range- no monitor  Chest pain, palpitations-no       Dyspnea- no  Medications: Compliance- yes  Lightheadedness,Syncope- no    Edema- no   DIABETES: Disease Monitoring: Blood Sugar ranges- 160-190; highest 2 hr post meal glucose < 140  Polyuria/phagia/dipsia- no       Visual problems- no; last Ophth exam 2011  Medications: Compliance- yes  Hypoglycemic symptoms-no    HYPERLIPIDEMIA: Disease Monitoring: See symptoms for Hypertension  Medications: Compliance- yes  Abd pain, bowel changes-no   Muscle aches- not significant           Review of Systems  He has had a recent respiratory tract infection manifested by cough with yellow sputum. He's also had some slight wheezing for which he has been using NyQuil. He questioned whether this had raised his sugar. He has residual congestion in the chest and throat. He denied associated frontal headache, facial pain, nasal purulence, sore throat, dental pain, or otic pain. Additionally he denies fever, chills, or sweats.     Objective:   Physical Exam Gen.: Thin but healthy and well-nourished in appearance. Alert, appropriate and cooperative throughout exam.Appears younger than stated age   Eyes: No corneal or conjunctival inflammation noted. Ears: External  ear exam reveals no significant lesions or deformities. Canals clear .TMs normal. Hearing is grossly normal bilaterally. Nose: External nasal exam reveals no deformity or inflammation. Nasal mucosa are pink and moist. No lesions or exudates noted. Mouth: Oral mucosa and oropharynx reveal no lesions or exudates. Teeth in good repair. Neck: No deformities, masses, or tenderness noted.  Thyroid normal. Lungs: Normal respiratory effort; chest expands symmetrically. Lungs are clear to auscultation without rales, wheezes, or  increased work of breathing. Heart: Slow rate and regular rhythm. Normal S1 and S2. No gallop, click, or rub. S4 with slurring at  LSB . Abdomen: Bowel sounds normal; abdomen soft and nontender. No masses, organomegaly or hernias noted.  Musculoskeletal/extremities:  No clubbing, cyanosis, edema noted. Tone & strength  normal.Nail health  good. Vascular: Carotid, radial artery, dorsalis pedis and  posterior tibial pulses are full and equal. No bruits present. Neurologic: Alert and oriented x3. Deep tendon reflexes symmetrical and normal.          Skin: Intact without suspicious lesions or rashes. Lymph: No cervical, axillary lymphadenopathy present. Psych: Mood and affect are normal. Normally interactive                                                                                         Assessment & Plan:  #1 see updated problem list with recommendations  #2 bronchitis, acute  Plan: See orders and recommendations

## 2012-11-04 NOTE — Patient Instructions (Addendum)
Annual  retinal assessments are indicated because the diabetes. Eat a low-fat diet with lots of fruits and vegetables, up to 7-9 servings per day. Consume less than 40 (ptreferably ZERO) grams of sugar per day from foods & drinks with High Fructose Corn Syrup (HFCS) sugar as #1,2,3 or # 4 on label.Whole Foods, [employer] [place] do not carry products with HFCS. Follow a  low carb nutrition program such as [place] or The New Sugar Busters  to prevent Diabetes progression . White carbohydrates (potatoes, rice, bread, and pasta) have a high spike of sugar and a high load of sugar. For example a  baked potato has a cup of sugar and a  french fry  2 teaspoons of sugar. Yams, wild  rice, whole grained bread &  wheat pasta have been much lower spike and load of  sugar. Portions should be the size of a deck of cards or your palm. Please  schedule  Labs in 10-11 weeks : A1c.PLEASE BRING THESE INSTRUCTIONS TO FOLLOW UP  LAB APPOINTMENT.This will guarantee correct labs are drawn, eliminating need for repeat blood sampling ( needle sticks ! ). Diagnoses /Codes: 250.02.  If you activate My Chart; the results can be released to you as soon as they populate from the lab. If you choose not to use this program; the labs have to be reviewed, copied & mailed   causing a delay in getting the results to you.

## 2012-11-04 NOTE — Assessment & Plan Note (Signed)
Nutrition and exercise will be stressed. Updated ophthalmologic exam due. For nephroprotection low-dose ACE inhibitor will be added. Glimiperide will be initiated. Followup A1c in 10 weeks.

## 2012-11-04 NOTE — Assessment & Plan Note (Signed)
Blood pressure is well controlled; bradycardia is most likely due to the beta blocker. The dose will be decreased as low-dose ACE inhibitor will be added.

## 2012-11-27 DIAGNOSIS — E119 Type 2 diabetes mellitus without complications: Secondary | ICD-10-CM | POA: Diagnosis not present

## 2013-01-12 ENCOUNTER — Other Ambulatory Visit (INDEPENDENT_AMBULATORY_CARE_PROVIDER_SITE_OTHER): Payer: Medicare Other

## 2013-01-12 DIAGNOSIS — IMO0001 Reserved for inherently not codable concepts without codable children: Secondary | ICD-10-CM | POA: Diagnosis not present

## 2013-01-12 DIAGNOSIS — E039 Hypothyroidism, unspecified: Secondary | ICD-10-CM

## 2013-01-12 LAB — TSH: TSH: 3.57 u[IU]/mL (ref 0.35–5.50)

## 2013-01-12 LAB — HEMOGLOBIN A1C: Hgb A1c MFr Bld: 7.6 % — ABNORMAL HIGH (ref 4.6–6.5)

## 2013-01-14 ENCOUNTER — Other Ambulatory Visit: Payer: Federal, State, Local not specified - PPO

## 2013-02-03 ENCOUNTER — Other Ambulatory Visit: Payer: Self-pay | Admitting: Internal Medicine

## 2013-02-03 NOTE — Telephone Encounter (Signed)
Left message with male to have patient return call when available, reason for call: Patient needs to see Dr.Hopper prior to refill

## 2013-02-03 NOTE — Telephone Encounter (Signed)
Pt returned call, Tried to call Pt back phone rang then started beeping.

## 2013-02-04 NOTE — Telephone Encounter (Signed)
pt returned call from yesterday cb# [phone]

## 2013-02-04 NOTE — Telephone Encounter (Signed)
Spoke with patient, patient scheduled to be seen on 02/11/2013

## 2013-02-11 ENCOUNTER — Ambulatory Visit (INDEPENDENT_AMBULATORY_CARE_PROVIDER_SITE_OTHER): Payer: Medicare Other | Admitting: Internal Medicine

## 2013-02-11 ENCOUNTER — Encounter: Payer: Self-pay | Admitting: Internal Medicine

## 2013-02-11 VITALS — BP 130/66 | HR 53 | Wt 182.0 lb

## 2013-02-11 DIAGNOSIS — E119 Type 2 diabetes mellitus without complications: Secondary | ICD-10-CM | POA: Diagnosis not present

## 2013-02-11 NOTE — Patient Instructions (Addendum)
Cardiovascular exercise, this can be as simple a program as walking, is recommended 30-45 minutes 3-4 times per week. If you're not exercising you should take 6-8 weeks to build up to this level.  Please  schedule fasting Labs in 12 weeks after nutrition changes & execise: BMET,Lipids, hepatic panel, urine microalbumin, A1c. PLEASE BRING THESE INSTRUCTIONS TO FOLLOW UP  LAB APPOINTMENT.This will guarantee correct labs are drawn, eliminating need for repeat blood sampling ( needle sticks ! ). Diagnoses /Codes: 250.02, 272.4, 995.20.    If you activate the  My Chart system; lab & Xray results will be released directly  to you as soon as I review & address these through the computer. As per [place] all records must be reviewed and signed off within 72 hours; but I attempt to complete this within 36 hours unless I have no access to the electronic medical record.If I wait more than 36 hours the volume of reports becomes difficult to manage optimally. In my absence ;my partners would address the results.Critical lab results will be communicated to you ASAP. If you choose not to sign up for My Chart within 36 hours of labs being drawn; results will be reviewed & interpretation added before being copied & mailed, causing a delay in getting the results to you.If you do not receive that report within 7-10 days ,please call. Additionally you can use this system to gain direct  access to your records  if  out of town or @ an office of a  physician who is not in  the My Chart network.  This improves continuity of care & places you in control of your medical record.

## 2013-02-11 NOTE — Assessment & Plan Note (Signed)
Glimiperide will be discontinued. Cardiovascular exercise will be stressed along with low carb diet. A1c with urine microalbumin in 3 months.

## 2013-02-11 NOTE — Progress Notes (Signed)
Subjective:    Patient ID: Ethan Mcneil, male    DOB: [birth year], 79 y.o.   MRN: [id number]  HPI Diabetes status assessment: Fasting morning glucose range average is 135-160.                                             Highest glucose 2 hours after any meal is not monitored.                                                            Low-carb and low- fat diett followed Medication compliance is good. No medication adverse effects noted. Eye exam 12/04/12 No retinopathy as per Dr Samara Snide   A1c 7.6% on  01/12/13 (average sugar 172 with long-term 52 %  risk ) on Glimepiride & Metformin   Review of Systems  No hypoglycemia reported    No excess thirst ;  excess hunger ; or excess urination reported                 No regular exercise described as  times per week for minutes. Foot care not current         No lightheadedness with standing reported No chest pain ; palpitations ; claudication described .                                                                                                                             No non healing skin  ulcers or sores of extremities noted. No numbness or tingling or burning in feet described                                                                                                                                             No significant change in weight of pound gain pound loss. No blurred,double, or loss of vision reported  .             Objective:   Physical Exam Gen.: Thin but healthy  & well-nourished, appropriate and alert, weight Eyes:  No lid/conjunctival changes Neck: Normal range of motion, thyroid normal Respiratory: No increased work of breathing or abnormal breath sounds Cardiac : regular rhythm, no extra heart sounds, gallop, murmur.S4 with slurring at  LSB Abdomen: No organomegaly ,masses, bruits or aortic enlargement Lymph: No lymphadenopathy of the neck or axilla Skin: No rashes, lesions, ulcers or ischemic  changes Muscle skeletal: no nail changes; joints normal Vasc:All pulses intact, no bruits present. Pedal pulses slightly decreased Neuro: Normal deep tendon reflexes, alert & oriented, sensation over feet normal Psych: judgment and insight, mood and affect normal        Assessment & Plan:

## 2013-04-12 ENCOUNTER — Other Ambulatory Visit: Payer: Self-pay | Admitting: Internal Medicine

## 2013-05-04 ENCOUNTER — Other Ambulatory Visit: Payer: Self-pay | Admitting: Internal Medicine

## 2013-05-04 NOTE — Telephone Encounter (Signed)
Left message with patient's wife to have patient return call when available, reason for call: Need to verify if patient is taking Altace (ramparil)- not on medication list

## 2013-05-05 ENCOUNTER — Telehealth: Payer: Self-pay | Admitting: General Practice

## 2013-05-05 NOTE — Telephone Encounter (Signed)
Left message with patient's wife to have patient return call when available

## 2013-05-05 NOTE — Telephone Encounter (Signed)
Pt had called triage line in regards to an issue with his medication. Said to call back st [phone]. Called that number and there was no answer. Will call again later.

## 2013-05-06 ENCOUNTER — Other Ambulatory Visit (INDEPENDENT_AMBULATORY_CARE_PROVIDER_SITE_OTHER): Payer: Medicare Other

## 2013-05-06 ENCOUNTER — Telehealth: Payer: Self-pay | Admitting: Internal Medicine

## 2013-05-06 DIAGNOSIS — E785 Hyperlipidemia, unspecified: Secondary | ICD-10-CM

## 2013-05-06 DIAGNOSIS — T887XXA Unspecified adverse effect of drug or medicament, initial encounter: Secondary | ICD-10-CM | POA: Diagnosis not present

## 2013-05-06 DIAGNOSIS — IMO0001 Reserved for inherently not codable concepts without codable children: Secondary | ICD-10-CM | POA: Diagnosis not present

## 2013-05-06 LAB — HEPATIC FUNCTION PANEL
ALT: 16 U/L (ref 0–53)
AST: 14 U/L (ref 0–37)
Albumin: 3.7 g/dL (ref 3.5–5.2)
Alkaline Phosphatase: 46 U/L (ref 39–117)
Bilirubin, Direct: 0 mg/dL (ref 0.0–0.3)
Total Bilirubin: 0.5 mg/dL (ref 0.3–1.2)
Total Protein: 6.5 g/dL (ref 6.0–8.3)

## 2013-05-06 LAB — LIPID PANEL
Cholesterol: 88 mg/dL (ref 0–200)
HDL: 36 mg/dL — ABNORMAL LOW (ref >39.00)
LDL Cholesterol: 38 mg/dL (ref 0–99)
Total CHOL/HDL Ratio: 2
Triglycerides: 68 mg/dL (ref 0.0–149.0)
VLDL: 13.6 mg/dL (ref 0.0–40.0)

## 2013-05-06 LAB — MICROALBUMIN / CREATININE URINE RATIO
Creatinine,U: 187.9 mg/dL
Microalb Creat Ratio: 0.9 mg/g (ref 0.0–30.0)
Microalb, Ur: 1.7 mg/dL (ref 0.0–1.9)

## 2013-05-06 LAB — BASIC METABOLIC PANEL
BUN: 19 mg/dL (ref 6–23)
CO2: 26 mEq/L (ref 19–32)
Calcium: 8.9 mg/dL (ref 8.4–10.5)
Chloride: 102 mEq/L (ref 96–112)
Creatinine, Ser: 0.9 mg/dL (ref 0.4–1.5)
GFR: 89.31 mL/min (ref >60.00)
Glucose, Bld: 144 mg/dL — ABNORMAL HIGH (ref 70–99)
Potassium: 4 mEq/L (ref 3.5–5.1)
Sodium: 136 mEq/L (ref 135–145)

## 2013-05-06 LAB — HEMOGLOBIN A1C: Hgb A1c MFr Bld: 9 % — ABNORMAL HIGH (ref 4.6–6.5)

## 2013-05-06 NOTE — Telephone Encounter (Signed)
Pt tried to call to get refill and couldn't get through- he would like Ramipril 2.5 mg capsule pls call pt -he also has questions.

## 2013-05-06 NOTE — Telephone Encounter (Signed)
Duplicate encounter see 05-04-13 note.

## 2013-05-06 NOTE — Telephone Encounter (Signed)
Pt called back stating that he is taking this med and that it should be on his list. Med last filled 11-04-12 #90 1. Rx sent to pharmacy

## 2013-05-06 NOTE — Telephone Encounter (Signed)
Please see other encounter from [phone] duplicate.

## 2013-06-23 ENCOUNTER — Other Ambulatory Visit: Payer: Self-pay | Admitting: Internal Medicine

## 2013-06-23 NOTE — Telephone Encounter (Signed)
I called patient, patient informed he is due to follow-up with Dr.Hopper to discuss DM and medications. Appointment scheduled for next Thursday 07/01/13 at 8 am

## 2013-07-01 ENCOUNTER — Encounter: Payer: Self-pay | Admitting: Internal Medicine

## 2013-07-01 ENCOUNTER — Ambulatory Visit (INDEPENDENT_AMBULATORY_CARE_PROVIDER_SITE_OTHER): Payer: Medicare Other | Admitting: Internal Medicine

## 2013-07-01 VITALS — BP 124/70 | HR 58 | Wt 173.0 lb

## 2013-07-01 DIAGNOSIS — E782 Mixed hyperlipidemia: Secondary | ICD-10-CM | POA: Diagnosis not present

## 2013-07-01 DIAGNOSIS — I10 Essential (primary) hypertension: Secondary | ICD-10-CM | POA: Diagnosis not present

## 2013-07-01 DIAGNOSIS — IMO0001 Reserved for inherently not codable concepts without codable children: Secondary | ICD-10-CM

## 2013-07-01 MED ORDER — GLUCOSE BLOOD VI STRP: ORAL_STRIP | Status: DC

## 2013-07-01 MED ORDER — SIMVASTATIN 20 MG PO TABS: 20.0000 mg | ORAL_TABLET | Freq: Every day | ORAL | Status: DC

## 2013-07-01 MED ORDER — SAXAGLIPTIN-METFORMIN ER 5-1000 MG PO TB24: ORAL_TABLET | ORAL | Status: DC

## 2013-07-01 NOTE — Patient Instructions (Addendum)
Please  schedule fasting Labs in 12 -14 weeks after med changes: BMET,Lipids,  A1c. PLEASE BRING THESE INSTRUCTIONS TO FOLLOW UP  LAB APPOINTMENT.This will guarantee correct labs are drawn, eliminating need for repeat blood sampling ( needle sticks ! ). Diagnoses /Codes: 250.02; 272.4

## 2013-07-01 NOTE — Assessment & Plan Note (Signed)
Simvastatin will be decreased to 20 mg daily; repeat fasting lipids after 3-4 months. LDL goal is less than 70

## 2013-07-01 NOTE — Assessment & Plan Note (Signed)
Excellent blood pressure control; no change indicated.

## 2013-07-01 NOTE — Progress Notes (Signed)
  Subjective:    Patient ID: Ethan Mcneil, male    DOB: [birth year], 79 y.o.   MRN: [id number]  HPI The patient is here for followup of diabetes, hyperlipidemia ,and hypertension.  The most recent A1c was 9%  , which correlates to an average sugar of 212 , and long-term risk of  80% . Fasting blood sugar range 150-190 .  Two-hour postprandial glucose is   not monitored . No hypoglycemia reported Last ophthalmologic examination <12  mos  revealed no retinopathy. No active podiatry assessment on record. Diet is low carb , heart healthy. Exercise 5 X/ week as walking CVE for > 90 minutes.  The most recent lipids   reveal LDL 38 ; HDL 36; and triglycerides 68  . There is medical compliance with the statin.No significant  subjective myalgias .  Blood pressure not monitored  . There is medical  compliance with antihypertensive medications  No  medication adverse effects suggested .       Review of Systems Constitutional: 10 # weight loss;  No excess fatigue Eyes: No  blurred vision;  double vision ; loss of vision Cardiovascular: no chest pain ;palpitations; racing; irregular rhythm ;syncope; claudication . Slight L leg edema  Respiratory: No exertional dyspnea;  paroxysmal nocturnal dyspnea Dermatologic: No non healing  Lesions; change in color or temperature of skin Neurologic: No  limb weakness;  numbness or tingling;  burning Endocrine: No change in hair, skin, nails. No excessive thirst; excessive hunger;  excessive urination      Objective:   Physical Exam Gen.: Thin but healthy and well-nourished in appearance. Alert, appropriate and cooperative throughout exam.Appears younger than stated age   Eyes: No corneal or conjunctival inflammation noted.  Mouth: Oral mucosa and oropharynx reveal no lesions or exudates. Teeth in good repair. Neck: No deformities, masses, or tenderness noted.  Thyroid normal. Lungs: Normal respiratory effort; chest expands symmetrically. Lungs are  clear to auscultation without rales, wheezes, or increased work of breathing. Heart: Normal rate and rhythm. Normal S1 and S2. No gallop, click, or rub. S4 w/o murmur. Abdomen: Bowel sounds normal; abdomen soft and nontender. No masses, organomegaly or hernias noted.                                Musculoskeletal/extremities: No clubbing, cyanosis, edema, or significant extremity  deformity noted. Range of motion normal .Tone & strength  Normal. Joints normal . Nail health good. Able to lie down & sit up w/o help.  Vascular: Carotid, radial artery  pulses are full and equal.  Decreased dorsalis pedis and  posterior tibial pulses.No bruits present. Neurologic: Alert and oriented x3.  Light touch normal over feet.    Skin: Intact without suspicious lesions or rashes. Lymph: No cervical, axillary lymphadenopathy present. Psych: Mood and affect are normal. Normally interactive                                                                                        Assessment & Plan:  See Current Assessment & Plan in Problem List under specific Diagnosis

## 2013-07-01 NOTE — Assessment & Plan Note (Signed)
Options discussed; rather than starting basal insulin he wants to try combination agent. He will verify which is the preferred agent on his formulary.

## 2013-07-02 ENCOUNTER — Other Ambulatory Visit: Payer: Self-pay | Admitting: Internal Medicine

## 2013-10-14 ENCOUNTER — Encounter: Payer: Self-pay | Admitting: Internal Medicine

## 2013-10-14 ENCOUNTER — Other Ambulatory Visit (INDEPENDENT_AMBULATORY_CARE_PROVIDER_SITE_OTHER): Payer: Medicare Other

## 2013-10-14 DIAGNOSIS — E782 Mixed hyperlipidemia: Secondary | ICD-10-CM

## 2013-10-14 DIAGNOSIS — IMO0001 Reserved for inherently not codable concepts without codable children: Secondary | ICD-10-CM

## 2013-10-14 DIAGNOSIS — I10 Essential (primary) hypertension: Secondary | ICD-10-CM | POA: Diagnosis not present

## 2013-10-14 LAB — LIPID PANEL
Cholesterol: 98 mg/dL (ref 0–200)
HDL: 39.9 mg/dL (ref >39.00)
LDL Cholesterol: 45 mg/dL (ref 0–99)
Total CHOL/HDL Ratio: 2
Triglycerides: 66 mg/dL (ref 0.0–149.0)
VLDL: 13.2 mg/dL (ref 0.0–40.0)

## 2013-10-14 LAB — BASIC METABOLIC PANEL
BUN: 20 mg/dL (ref 6–23)
CO2: 26 mEq/L (ref 19–32)
Calcium: 9 mg/dL (ref 8.4–10.5)
Chloride: 103 mEq/L (ref 96–112)
Creatinine, Ser: 1 mg/dL (ref 0.4–1.5)
GFR: 76.09 mL/min (ref >60.00)
Glucose, Bld: 155 mg/dL — ABNORMAL HIGH (ref 70–99)
Potassium: 4.2 mEq/L (ref 3.5–5.1)
Sodium: 137 mEq/L (ref 135–145)

## 2013-10-14 LAB — HEMOGLOBIN A1C: Hgb A1c MFr Bld: 8.4 % — ABNORMAL HIGH (ref 4.6–6.5)

## 2013-10-21 ENCOUNTER — Other Ambulatory Visit: Payer: Self-pay

## 2013-10-30 ENCOUNTER — Other Ambulatory Visit: Payer: Self-pay | Admitting: Internal Medicine

## 2013-11-01 NOTE — Telephone Encounter (Signed)
Ramipril refilled per protocol

## 2013-11-16 ENCOUNTER — Other Ambulatory Visit: Payer: Self-pay | Admitting: Internal Medicine

## 2013-11-16 NOTE — Telephone Encounter (Signed)
Levothyroxine refilled per protocol

## 2013-12-05 DIAGNOSIS — N23 Unspecified renal colic: Secondary | ICD-10-CM | POA: Diagnosis not present

## 2013-12-05 DIAGNOSIS — Z87442 Personal history of urinary calculi: Secondary | ICD-10-CM | POA: Diagnosis not present

## 2013-12-05 DIAGNOSIS — N200 Calculus of kidney: Secondary | ICD-10-CM | POA: Diagnosis not present

## 2013-12-05 DIAGNOSIS — N201 Calculus of ureter: Secondary | ICD-10-CM | POA: Diagnosis not present

## 2013-12-10 ENCOUNTER — Telehealth: Payer: Self-pay | Admitting: *Deleted

## 2013-12-10 ENCOUNTER — Other Ambulatory Visit: Payer: Self-pay | Admitting: Internal Medicine

## 2013-12-10 DIAGNOSIS — N200 Calculus of kidney: Secondary | ICD-10-CM

## 2013-12-10 NOTE — Telephone Encounter (Signed)
12/10/2013 Pt came by office.  He was told by [facility] 12/05/2013 he needed to see Urologist by 12/07/2013 if he did not pass stone.  They did scan and told him he had stone. They put him on flomax and a pain medicine.  He is still taking the flomax but not currently taking the pain med. The pain is gone, but he has not passed stone as of today.  Pt would like the name of a urologist or if you can refer him to one.  Please advise.  bw

## 2013-12-10 NOTE — Telephone Encounter (Signed)
Please advise 

## 2013-12-10 NOTE — Telephone Encounter (Signed)
[facility] ; outside records needed for referral

## 2013-12-13 NOTE — Telephone Encounter (Signed)
Called and left message for patient to return call. JG//CMA

## 2013-12-13 NOTE — Telephone Encounter (Signed)
Spoke with patient and he stated that he does not have records but is willing to sign a release to have these obtained from [facility]. He would like to know if he can go by there to sign a release since he lives in [place]. He would like for [place] to call him back to let him know. JG//CMA

## 2013-12-16 DIAGNOSIS — N209 Urinary calculus, unspecified: Secondary | ICD-10-CM

## 2013-12-16 HISTORY — DX: Urinary calculus, unspecified: N20.9

## 2013-12-17 DIAGNOSIS — N1330 Unspecified hydronephrosis: Secondary | ICD-10-CM | POA: Diagnosis not present

## 2013-12-17 DIAGNOSIS — N201 Calculus of ureter: Secondary | ICD-10-CM | POA: Diagnosis not present

## 2013-12-17 DIAGNOSIS — N200 Calculus of kidney: Secondary | ICD-10-CM | POA: Diagnosis not present

## 2013-12-17 DIAGNOSIS — N179 Acute kidney failure, unspecified: Secondary | ICD-10-CM | POA: Diagnosis not present

## 2013-12-29 ENCOUNTER — Other Ambulatory Visit: Payer: Self-pay | Admitting: Internal Medicine

## 2013-12-29 NOTE — Telephone Encounter (Signed)
Simvastatin refilled per protocol. JG//CMA

## 2014-01-03 DIAGNOSIS — N201 Calculus of ureter: Secondary | ICD-10-CM | POA: Diagnosis not present

## 2014-01-03 DIAGNOSIS — N200 Calculus of kidney: Secondary | ICD-10-CM | POA: Diagnosis not present

## 2014-01-11 DIAGNOSIS — H2510 Age-related nuclear cataract, unspecified eye: Secondary | ICD-10-CM | POA: Diagnosis not present

## 2014-01-11 DIAGNOSIS — E119 Type 2 diabetes mellitus without complications: Secondary | ICD-10-CM | POA: Diagnosis not present

## 2014-01-17 ENCOUNTER — Other Ambulatory Visit: Payer: Self-pay | Admitting: Internal Medicine

## 2014-01-18 NOTE — Telephone Encounter (Signed)
Kombiglyze refilled per protocol. JG//CMA

## 2014-02-10 ENCOUNTER — Encounter: Payer: Federal, State, Local not specified - PPO | Admitting: Internal Medicine

## 2014-02-19 ENCOUNTER — Emergency Department (HOSPITAL_COMMUNITY)
Admission: EM | Admit: 2014-02-19 | Discharge: 2014-02-19 | Disposition: A | Discharge status: Home / Self Care | Payer: Medicare Other | Attending: Emergency Medicine | Admitting: Emergency Medicine

## 2014-02-19 ENCOUNTER — Encounter (HOSPITAL_COMMUNITY): Payer: Self-pay | Admitting: Emergency Medicine

## 2014-02-19 ENCOUNTER — Emergency Department (HOSPITAL_COMMUNITY): Payer: Medicare Other

## 2014-02-19 DIAGNOSIS — Z951 Presence of aortocoronary bypass graft: Secondary | ICD-10-CM | POA: Insufficient documentation

## 2014-02-19 DIAGNOSIS — Z7982 Long term (current) use of aspirin: Secondary | ICD-10-CM | POA: Diagnosis not present

## 2014-02-19 DIAGNOSIS — I10 Essential (primary) hypertension: Secondary | ICD-10-CM | POA: Insufficient documentation

## 2014-02-19 DIAGNOSIS — M129 Arthropathy, unspecified: Secondary | ICD-10-CM | POA: Diagnosis not present

## 2014-02-19 DIAGNOSIS — N201 Calculus of ureter: Secondary | ICD-10-CM | POA: Diagnosis not present

## 2014-02-19 DIAGNOSIS — E785 Hyperlipidemia, unspecified: Secondary | ICD-10-CM | POA: Insufficient documentation

## 2014-02-19 DIAGNOSIS — E119 Type 2 diabetes mellitus without complications: Secondary | ICD-10-CM | POA: Insufficient documentation

## 2014-02-19 DIAGNOSIS — Z79899 Other long term (current) drug therapy: Secondary | ICD-10-CM | POA: Insufficient documentation

## 2014-02-19 DIAGNOSIS — R1031 Right lower quadrant pain: Secondary | ICD-10-CM | POA: Diagnosis not present

## 2014-02-19 DIAGNOSIS — I2510 Atherosclerotic heart disease of native coronary artery without angina pectoris: Secondary | ICD-10-CM | POA: Insufficient documentation

## 2014-02-19 DIAGNOSIS — N200 Calculus of kidney: Secondary | ICD-10-CM | POA: Diagnosis not present

## 2014-02-19 DIAGNOSIS — Z8619 Personal history of other infectious and parasitic diseases: Secondary | ICD-10-CM | POA: Diagnosis not present

## 2014-02-19 DIAGNOSIS — R112 Nausea with vomiting, unspecified: Secondary | ICD-10-CM | POA: Insufficient documentation

## 2014-02-19 LAB — CBC WITH DIFFERENTIAL/PLATELET
Basophils Absolute: 0.1 10*3/uL (ref 0.0–0.1)
Basophils Relative: 1 % (ref 0–1)
Eosinophils Absolute: 0.1 10*3/uL (ref 0.0–0.7)
Eosinophils Relative: 1 % (ref 0–5)
HCT: 35.9 % — ABNORMAL LOW (ref 39.0–52.0)
Hemoglobin: 12.4 g/dL — ABNORMAL LOW (ref 13.0–17.0)
Lymphocytes Relative: 11 % — ABNORMAL LOW (ref 12–46)
Lymphs Abs: 1.3 10*3/uL (ref 0.7–4.0)
MCH: 29.5 pg (ref 26.0–34.0)
MCHC: 34.5 g/dL (ref 30.0–36.0)
MCV: 85.3 fL (ref 78.0–100.0)
Monocytes Absolute: 0.6 10*3/uL (ref 0.1–1.0)
Monocytes Relative: 6 % (ref 3–12)
Neutro Abs: 9.2 10*3/uL — ABNORMAL HIGH (ref 1.7–7.7)
Neutrophils Relative %: 82 % — ABNORMAL HIGH (ref 43–77)
Platelets: 194 10*3/uL (ref 150–400)
RBC: 4.21 MIL/uL — ABNORMAL LOW (ref 4.22–5.81)
RDW: 13.1 % (ref 11.5–15.5)
WBC: 11.3 10*3/uL — ABNORMAL HIGH (ref 4.0–10.5)

## 2014-02-19 LAB — COMPREHENSIVE METABOLIC PANEL
ALT: 12 U/L (ref 0–53)
AST: 13 U/L (ref 0–37)
Albumin: 4.1 g/dL (ref 3.5–5.2)
Alkaline Phosphatase: 63 U/L (ref 39–117)
BUN: 19 mg/dL (ref 6–23)
CO2: 25 mEq/L (ref 19–32)
Calcium: 9.3 mg/dL (ref 8.4–10.5)
Chloride: 94 mEq/L — ABNORMAL LOW (ref 96–112)
Creatinine, Ser: 1.23 mg/dL (ref 0.50–1.35)
GFR calc Af Amer: 64 mL/min — ABNORMAL LOW (ref >90)
GFR calc non Af Amer: 55 mL/min — ABNORMAL LOW (ref >90)
Glucose, Bld: 269 mg/dL — ABNORMAL HIGH (ref 70–99)
Potassium: 5.1 mEq/L (ref 3.7–5.3)
Sodium: 132 mEq/L — ABNORMAL LOW (ref 137–147)
Total Bilirubin: 0.3 mg/dL (ref 0.3–1.2)
Total Protein: 6.8 g/dL (ref 6.0–8.3)

## 2014-02-19 LAB — URINALYSIS, ROUTINE W REFLEX MICROSCOPIC
Bilirubin Urine: NEGATIVE
Glucose, UA: >1000 mg/dL — AB
Hgb urine dipstick: NEGATIVE
Ketones, ur: NEGATIVE mg/dL
Leukocytes, UA: NEGATIVE
Nitrite: NEGATIVE
Protein, ur: NEGATIVE mg/dL
Specific Gravity, Urine: 1.02 (ref 1.005–1.030)
Urobilinogen, UA: 0.2 mg/dL (ref 0.0–1.0)
pH: 5 (ref 5.0–8.0)

## 2014-02-19 LAB — URINE MICROSCOPIC-ADD ON

## 2014-02-19 MED ORDER — HYDROMORPHONE HCL 2 MG PO TABS: ORAL_TABLET | ORAL | Status: DC

## 2014-02-19 MED ORDER — MORPHINE SULFATE 4 MG/ML IJ SOLN
4.0000 mg | Freq: Once | INTRAMUSCULAR | Status: AC
  Administered 2014-02-19: 4 mg via INTRAVENOUS
  Filled 2014-02-19: qty 1

## 2014-02-19 MED ORDER — TAMSULOSIN HCL 0.4 MG PO CAPS: 0.4000 mg | ORAL_CAPSULE | Freq: Every day | ORAL | Status: DC

## 2014-02-19 MED ORDER — ONDANSETRON HCL 4 MG/2ML IJ SOLN
4.0000 mg | Freq: Once | INTRAMUSCULAR | Status: AC
  Administered 2014-02-19: 4 mg via INTRAVENOUS
  Filled 2014-02-19: qty 2

## 2014-02-19 MED ORDER — KETOROLAC TROMETHAMINE 15 MG/ML IJ SOLN: 30.0000 mg | Freq: Once | INTRAMUSCULAR | Status: DC

## 2014-02-19 NOTE — ED Notes (Signed)
Pt states hx of kidney stones.  Pt states that he has been having rt flank pain since last night w/ vomiting.

## 2014-02-19 NOTE — ED Notes (Signed)
Pt transported to CT ?

## 2014-02-19 NOTE — ED Notes (Signed)
Patient aware that we need a urine specimen 

## 2014-02-19 NOTE — ED Provider Notes (Signed)
I saw and evaluated the patient, reviewed the resident's note and I agree with the findings and plan.   .Face to face Exam:  General:  Awake HEENT:  Atraumatic Resp:  Normal effort Abd:  Nondistended Neuro:No focal weakness   Dot Lanes, MD 02/19/14 [id number]

## 2014-02-19 NOTE — Discharge Instructions (Signed)
Kidney Stones  Kidney stones (urolithiasis) are deposits that form inside your kidneys. The intense pain is caused by the stone moving through the urinary tract. When the stone moves, the ureter goes into spasm around the stone. The stone is usually passed in the urine.   CAUSES   · A disorder that makes certain neck glands produce too much parathyroid hormone (primary hyperparathyroidism).  · A buildup of uric acid crystals, similar to gout in your joints.  · Narrowing (stricture) of the ureter.  · A kidney obstruction present at birth (congenital obstruction).  · Previous surgery on the kidney or ureters.  · Numerous kidney infections.  SYMPTOMS   · Feeling sick to your stomach (nauseous).  · Throwing up (vomiting).  · Blood in the urine (hematuria).  · Pain that usually spreads (radiates) to the groin.  · Frequency or urgency of urination.  DIAGNOSIS   · Taking a history and physical exam.  · Blood or urine tests.  · CT scan.  · Occasionally, an examination of the inside of the urinary bladder (cystoscopy) is performed.  TREATMENT   · Observation.  · Increasing your fluid intake.  · Extracorporeal shock wave lithotripsy This is a noninvasive procedure that uses shock waves to break up kidney stones.  · Surgery may be needed if you have severe pain or persistent obstruction. There are various surgical procedures. Most of the procedures are performed with the use of small instruments. Only small incisions are needed to accommodate these instruments, so recovery time is minimized.  The size, location, and chemical composition are all important variables that will determine the proper choice of action for you. Talk to your health care provider to better understand your situation so that you will minimize the risk of injury to yourself and your kidney.   HOME CARE INSTRUCTIONS   · Drink enough water and fluids to keep your urine clear or pale yellow. This will help you to pass the stone or stone fragments.  · Strain  all urine through the provided strainer. Keep all particulate matter and stones for your health care provider to see. The stone causing the pain may be as small as a grain of salt. It is very important to use the strainer each and every time you pass your urine. The collection of your stone will allow your health care provider to analyze it and verify that a stone has actually passed. The stone analysis will often identify what you can do to reduce the incidence of recurrences.  · Only take over-the-counter or prescription medicines for pain, discomfort, or fever as directed by your health care provider.  · Make a follow-up appointment with your health care provider as directed.  · Get follow-up X-rays if required. The absence of pain does not always mean that the stone has passed. It may have only stopped moving. If the urine remains completely obstructed, it can cause loss of kidney function or even complete destruction of the kidney. It is your responsibility to make sure X-rays and follow-ups are completed. Ultrasounds of the kidney can show blockages and the status of the kidney. Ultrasounds are not associated with any radiation and can be performed easily in a matter of minutes.  SEEK MEDICAL CARE IF:  · You experience pain that is progressive and unresponsive to any pain medicine you have been prescribed.  SEEK IMMEDIATE MEDICAL CARE IF:   · Pain cannot be controlled with the prescribed medicine.  · You have a fever   or shaking chills.  · The severity or intensity of pain increases over 18 hours and is not relieved by pain medicine.  · You develop a new onset of abdominal pain.  · You feel faint or pass out.  · You are unable to urinate.  MAKE SURE YOU:   · Understand these instructions.  · Will watch your condition.  · Will get help right away if you are not doing well or get worse.  Document Released: 12/02/2005 Document Revised: 08/04/2013 Document Reviewed: 05/05/2013  Exit[employer]® Patient Information ©2014  [employer].

## 2014-02-19 NOTE — ED Provider Notes (Signed)
CSN: [id number]     Arrival date & time 02/19/14  0756 History   First MD Initiated Contact with Patient 02/19/14 0805     Chief Complaint  Patient presents with  . Flank Pain     (Consider location/radiation/quality/duration/timing/severity/associated sxs/prior Treatment) Patient is a 80 y.o. male presenting with flank pain. The history is provided by the patient and a relative. No language interpreter was used.  Flank Pain Associated symptoms include abdominal pain, nausea and vomiting. Pertinent negatives include no chest pain, chills or fever.  This is a 80yo WM w/ PMH nephrolithiasis, DM type 2, HTN, HLD, CAD s/p CABG who presents w/ complaint of constant, sharp 9/10 R flank pain that began at midnight this morning. Pain radiates to R groin. Patient also endorses having N/V x 4 episodes since onset of the pain. He has sensation of incomplete voiding, denies dysuria, hematuria, increased urinary frequency, F/C, diarrhea, other abd pain, blood in stool. He has h/o nephrolithiasis, last episode 11/2013 where he was evaluated at [facility] at which time CT abd/pelvis showed 68mm stone in R ureter w/ 84mm stone in L kidney w/ mild R sided hydronephrosis. Pt is unsure if this stone passed, but notes the pain completely resolved until this morning. He took a hydrocodone this AM, but was unable to hold it down. He is followed by urology for his kidney stones and notes that he recently had some kind of imaging procedure that showed a R sided kidney stone that was "too large to pass." I do not have access to these records.  Past Medical History  Diagnosis Date  . PVD (peripheral vascular disease)   . Unspecified hyperplasia of prostate without urinary obstruction and other lower urinary tract symptoms (LUTS)   . DM2 (diabetes mellitus, type 2)   . HTN (hypertension)     essential nos  . HLD (hyperlipidemia)   . CAD (coronary artery disease)     CABG 1997; grafts patent @ cath [facility] 11/09  . S/P  herniorrhaphy   . Postsurgical aortocoronary bypass status   . Arthritis   . Tick fever 2012    [facility] [place]  . Gout     after tick bite   Past Surgical History  Procedure Laterality Date  . Coronary artery bypass graft      x7 vessels 1997; cath 2002 and 2009; grafts patent   . Shoulder surgery    . Colonoscopy  2012    polyps   . Inguinal hernia repair     Family History  Problem Relation Age of Onset  . Lung cancer Father   . Coronary artery disease Mother     stent  . Diabetes Brother     DM   . Prostate cancer Maternal Uncle     in his 56s   History  Substance Use Topics  . Smoking status: Never Smoker   . Smokeless tobacco: Never Used  . Alcohol Use: No    Review of Systems  Constitutional: Negative for fever and chills.  Respiratory: Negative for shortness of breath.   Cardiovascular: Negative for chest pain.  Gastrointestinal: Positive for nausea, vomiting and abdominal pain. Negative for diarrhea and blood in stool.  Genitourinary: Positive for flank pain. Negative for dysuria.  Neurological: Negative for light-headedness.  All other systems reviewed and are negative.      Allergies  Codeine  Home Medications   Current Outpatient Rx  Name  Route  Sig  Dispense  Refill  .  aspirin 81 MG tablet   Oral   Take 81 mg by mouth daily.          Randell Loop Prim-Borage (FLAX OIL XTRA) CAPS   Oral   Take 1 capsule by mouth daily.           Marland Kitchen HYDROcodone-acetaminophen (NORCO/VICODIN) 5-325 MG per tablet   Oral   Take 1 tablet by mouth every 6 (six) hours as needed for moderate pain.         Marland Kitchen levothyroxine (SYNTHROID, LEVOTHROID) 25 MCG tablet   Oral   Take 25 mcg by mouth daily before breakfast.         . metoprolol (LOPRESSOR) 50 MG tablet   Oral   Take 25 mg by mouth 2 (two) times daily.         . ramipril (ALTACE) 2.5 MG capsule   Oral   Take 2.5 mg by mouth daily.         . Saxagliptin-Metformin  (KOMBIGLYZE XR) 04-999 MG TB24   Oral   Take 1 tablet by mouth daily.         . simvastatin (ZOCOR) 20 MG tablet   Oral   Take 20 mg by mouth daily.          BP 174/64  Pulse 59  Temp(Src) 98.2 F (36.8 C) (Oral)  Resp 20  SpO2 100% Physical Exam  Nursing note and vitals reviewed. Constitutional: He is oriented to person, place, and time. He appears well-developed and well-nourished. He appears distressed.  Appears to be in significant pain, constantly changing position    HENT:  Head: Normocephalic and atraumatic.  Mouth/Throat: Oropharynx is clear and moist.  Eyes: Conjunctivae are normal.  Cardiovascular: Normal rate, regular rhythm and normal heart sounds.   Pulmonary/Chest: Effort normal and breath sounds normal.  Abdominal: Soft. Bowel sounds are normal. There is tenderness.  Mild TTP over RLQ and suprapubic region; no CVAT  Musculoskeletal: He exhibits no edema.  Neurological: He is alert and oriented to person, place, and time.  Skin: Skin is warm and dry.    ED Course  Procedures (including critical care time) Labs Review Labs Reviewed  URINALYSIS, ROUTINE W REFLEX MICROSCOPIC  CBC WITH DIFFERENTIAL  COMPREHENSIVE METABOLIC PANEL   Imaging Review No results found.   EKG Interpretation None      MDM   This is a 80yo WM w/ PMH nephrolithiasis as well as multiple other chronic medical problems who presents with R flank pain w/ radiation to R groin and associated N/V, which most likely represents recurrent kidney stone. Patient notes to me that he was told he has a R sided kidney stone on prior imaging that is too large to pass. We will obtain CT abd/pelvis to assess the size of patient's kidney stone. Will manage symptoms with morphine and zofran. Patient has hx of increased Cr during his stay at [facility] in December 2014, so will hold off on NSAIDS until we assess kidney function. CMP, CBC, UA pending.  10:49 AM Patient feeling much better. CT abd/pelvis  showed 51mm stone in R ureter at UVJ. UA negative. WBC slightly elevated. Cr 1.23, which is slightly elevated from 1.0 in 09/2013, but decreased from 11/2013 when he was evaluated for kidney stone at [facility] (Cr 1.6). Dr. Audie Pinto spoke with Urology- they will come evaluate patient in ED. Urology requesting UCx, so this was ordered.  12:00 PM  Urology evaluated patient. They are comfortable with discharging patient home with flomax  and pain medication w/ close urology follow up. Patient is comfortable with this plan.  Rebecca Eaton, MD 02/19/14 [phone]

## 2014-02-19 NOTE — Consult Note (Signed)
I have been asked to see the patient by Dr. Leonard Schwartz, M.D. , for evaluation and management of right obstructing distal ureteral stone.  History of present illness: This is a 80 year old male with a history of nephrolithiasis, followed by Dr. Festus Aloe, who presented to the emergency department early this morning after 6 hours of intense right-sided renal colic. The pain was rapidly progressing and he was unable to control it by taking Vicodin. He has associated nausea and emesis. The pain began in his flank and radiated down to his right testicle. He had associated urinary frequency and urgency. The patient denies any hematuria or dysuria. He has not had any fevers/chills. The patient has a known history of kidney stones and most recently passed some kidney stones in December 2014.  He's never had any kidney stone operations.  In the emergency department the patient was given IV pain medication. His pain significantly improved following this.  Review of systems: A 12 point comprehensive review of systems was obtained and is negative unless otherwise stated in the history of present illness.  Patient Active Problem List   Diagnosis Date Noted  . Abnormal thyroid blood test 04/21/2012  . DJD (degenerative joint disease) 04/21/2012  . HEPATIC CYST 10/03/2010  . BRADYCARDIA 05/02/2009  . PVD 05/02/2009  . HYPERPLASIA PROSTATE UNS W/O UR OBST & OTH LUTS 12/30/2008  . Type II or unspecified type diabetes mellitus without mention of complication, uncontrolled 07/04/2008  . HYPERLIPIDEMIA 11/03/2007  . HYPERTENSION, ESSENTIAL NOS 11/03/2007  . C A D 11/03/2007  . POSTSURGICAL AORTOCORONARY BYPASS STATUS 10/30/2007  . HERNIORRHAPHY, HX OF 10/30/2007    No current facility-administered medications on file prior to encounter.   Current Outpatient Prescriptions on File Prior to Encounter  Medication Sig Dispense Refill  . aspirin 81 MG tablet Take 81 mg by mouth daily.       Randell Loop Prim-Borage (FLAX OIL XTRA) CAPS Take 1 capsule by mouth daily.          Past Medical History  Diagnosis Date  . PVD (peripheral vascular disease)   . Unspecified hyperplasia of prostate without urinary obstruction and other lower urinary tract symptoms (LUTS)   . DM2 (diabetes mellitus, type 2)   . HTN (hypertension)     essential nos  . HLD (hyperlipidemia)   . CAD (coronary artery disease)     CABG 1997; grafts patent @ cath [facility] 11/09  . S/P herniorrhaphy   . Postsurgical aortocoronary bypass status   . Arthritis   . Tick fever 2012    [facility] , [place]  . Gout     after tick bite    Past Surgical History  Procedure Laterality Date  . Coronary artery bypass graft      x7 vessels 1997; cath 2002 and 2009; grafts patent   . Shoulder surgery    . Colonoscopy  2012    polyps   . Inguinal hernia repair      History  Substance Use Topics  . Smoking status: Never Smoker   . Smokeless tobacco: Never Used  . Alcohol Use: No    Family History  Problem Relation Age of Onset  . Lung cancer Father   . Coronary artery disease Mother     stent  . Diabetes Brother     DM   . Prostate cancer Maternal Uncle     in his 26s    PE: Filed Vitals:   02/19/14 0804  BP: 174/64  Pulse: 59  Temp: 98.2 F (36.8 C)  TempSrc: Oral  Resp: 20  SpO2: 100%    Patient appears to be in no acute distress  patient is alert and oriented x3 Atraumatic normocephalic head No cervical or supraclavicular lymphadenopathy appreciated No increased work of breathing, no audible wheezes/rhonchi Regular sinus rhythm/rate Abdomen is soft, nontender, nondistended, no CVA or suprapubic tenderness Lower string is are symmetric without appreciable edema Grossly neurologically intact No identifiable skin lesions   Recent Labs  02/19/14 0841  WBC 11.3*  HGB 12.4*  HCT 35.9*    Recent Labs  02/19/14 0841  NA 132*  K 5.1  CL 94*  CO2 25  GLUCOSE 269*  BUN  19  CREATININE 1.23  CALCIUM 9.3   UA: No evidence of gross hematuria, nitrite and leukocyte negative. Urine culture: Pending  Imaging: CT abdomen/pelvis, stone protocol:  IMPRESSION:Moderate right hydroureteronephrosis due to a 7 mm stone at the right ureterovesical junction. Nonobstructive left kidney stones.Chronic pancreatic duct dilatation.  Imp: Right distal/a UVJ stone with proximal hydronephrosis. The patient's symptoms are currently controlled. There is no evidence of infection here.  Recommendations: I recommended to the patient that we attempt medical expulsion therapy including Flomax, pain medication, anti-nausea medication and fluids. He did also discuss progressive intervention suggest ureteroscopy and shockwave. However due to taken to the operating room today for ureteroscopy and stent placement, but the patient would like to proceed with medical expulsion therapy initially. We'll plan to get him into our clinic early next week for followup. In the interim, I given the patient information as to when to return to the emergency department and what constitutes an emergency.   Louis Meckel W

## 2014-02-20 LAB — URINE CULTURE
Colony Count: NO GROWTH
Culture: NO GROWTH
Special Requests: NORMAL

## 2014-02-21 ENCOUNTER — Ambulatory Visit (HOSPITAL_COMMUNITY): Payer: Medicare Other

## 2014-02-21 ENCOUNTER — Other Ambulatory Visit: Payer: Self-pay | Admitting: Urology

## 2014-02-21 ENCOUNTER — Encounter (HOSPITAL_COMMUNITY): Payer: Self-pay | Admitting: *Deleted

## 2014-02-21 ENCOUNTER — Encounter (HOSPITAL_COMMUNITY): Payer: Medicare Other | Admitting: Certified Registered"

## 2014-02-21 ENCOUNTER — Ambulatory Visit (HOSPITAL_COMMUNITY): Payer: Medicare Other | Admitting: Certified Registered"

## 2014-02-21 ENCOUNTER — Ambulatory Visit (HOSPITAL_COMMUNITY)
Admission: AD | Admit: 2014-02-21 | Discharge: 2014-02-21 | Disposition: A | Discharge status: Home / Self Care | Payer: Medicare Other | Source: Ambulatory Visit | Attending: Urology | Admitting: Urology

## 2014-02-21 ENCOUNTER — Encounter (HOSPITAL_COMMUNITY)
Admission: AD | Disposition: A | Discharge status: Home / Self Care | Payer: Self-pay | Source: Ambulatory Visit | Attending: Urology

## 2014-02-21 DIAGNOSIS — I2510 Atherosclerotic heart disease of native coronary artery without angina pectoris: Secondary | ICD-10-CM | POA: Insufficient documentation

## 2014-02-21 DIAGNOSIS — E785 Hyperlipidemia, unspecified: Secondary | ICD-10-CM | POA: Diagnosis not present

## 2014-02-21 DIAGNOSIS — N1330 Unspecified hydronephrosis: Secondary | ICD-10-CM | POA: Insufficient documentation

## 2014-02-21 DIAGNOSIS — I739 Peripheral vascular disease, unspecified: Secondary | ICD-10-CM | POA: Insufficient documentation

## 2014-02-21 DIAGNOSIS — N201 Calculus of ureter: Secondary | ICD-10-CM | POA: Diagnosis not present

## 2014-02-21 DIAGNOSIS — Z01818 Encounter for other preprocedural examination: Secondary | ICD-10-CM | POA: Diagnosis not present

## 2014-02-21 DIAGNOSIS — I10 Essential (primary) hypertension: Secondary | ICD-10-CM | POA: Insufficient documentation

## 2014-02-21 DIAGNOSIS — N2889 Other specified disorders of kidney and ureter: Secondary | ICD-10-CM | POA: Diagnosis not present

## 2014-02-21 DIAGNOSIS — Z951 Presence of aortocoronary bypass graft: Secondary | ICD-10-CM | POA: Insufficient documentation

## 2014-02-21 DIAGNOSIS — N200 Calculus of kidney: Secondary | ICD-10-CM | POA: Diagnosis not present

## 2014-02-21 DIAGNOSIS — E119 Type 2 diabetes mellitus without complications: Secondary | ICD-10-CM | POA: Diagnosis not present

## 2014-02-21 DIAGNOSIS — M109 Gout, unspecified: Secondary | ICD-10-CM | POA: Diagnosis not present

## 2014-02-21 HISTORY — PX: CYSTOSCOPY WITH RETROGRADE PYELOGRAM, URETEROSCOPY AND STENT PLACEMENT: SHX5789

## 2014-02-21 HISTORY — DX: Chronic kidney disease, unspecified: N18.9

## 2014-02-21 LAB — GLUCOSE, CAPILLARY
Glucose-Capillary: 144 mg/dL — ABNORMAL HIGH (ref 70–99)
Glucose-Capillary: 141 mg/dL — ABNORMAL HIGH (ref 70–99)

## 2014-02-21 SURGERY — CYSTOURETEROSCOPY, WITH RETROGRADE PYELOGRAM AND STENT INSERTION
Anesthesia: General | Site: Ureter | Laterality: Right

## 2014-02-21 MED ORDER — METOPROLOL TARTRATE 25 MG PO TABS
25.0000 mg | ORAL_TABLET | Freq: Once | ORAL | Status: AC
  Administered 2014-02-21: 25 mg via ORAL
  Filled 2014-02-21: qty 1

## 2014-02-21 MED ORDER — EPHEDRINE SULFATE 50 MG/ML IJ SOLN
INTRAMUSCULAR | Status: DC | PRN
  Administered 2014-02-21: 5 mg via INTRAVENOUS

## 2014-02-21 MED ORDER — ONDANSETRON HCL 4 MG/2ML IJ SOLN
INTRAMUSCULAR | Status: DC | PRN
  Administered 2014-02-21: 4 mg via INTRAVENOUS

## 2014-02-21 MED ORDER — LACTATED RINGERS IV SOLN: INTRAVENOUS | Status: DC

## 2014-02-21 MED ORDER — FENTANYL CITRATE 0.05 MG/ML IJ SOLN
INTRAMUSCULAR | Status: DC | PRN
  Administered 2014-02-21: 25 ug via INTRAVENOUS

## 2014-02-21 MED ORDER — LIDOCAINE HCL 2 % EX GEL
CUTANEOUS | Status: AC
  Filled 2014-02-21: qty 10

## 2014-02-21 MED ORDER — PROPOFOL 10 MG/ML IV BOLUS
INTRAVENOUS | Status: AC
  Filled 2014-02-21: qty 20

## 2014-02-21 MED ORDER — IOHEXOL 300 MG/ML  SOLN
INTRAMUSCULAR | Status: DC | PRN
  Administered 2014-02-21: 7 mL

## 2014-02-21 MED ORDER — LACTATED RINGERS IV SOLN
INTRAVENOUS | Status: DC | PRN
  Administered 2014-02-21: 19:00:00 via INTRAVENOUS

## 2014-02-21 MED ORDER — CEFAZOLIN SODIUM-DEXTROSE 2-3 GM-% IV SOLR
INTRAVENOUS | Status: AC
  Filled 2014-02-21: qty 50

## 2014-02-21 MED ORDER — SODIUM CHLORIDE 0.9 % IR SOLN
Status: DC | PRN
  Administered 2014-02-21: 3000 mL

## 2014-02-21 MED ORDER — ONDANSETRON HCL 4 MG/2ML IJ SOLN
INTRAMUSCULAR | Status: AC
  Filled 2014-02-21: qty 2

## 2014-02-21 MED ORDER — SODIUM CHLORIDE 0.9 % IR SOLN
Status: DC | PRN
  Administered 2014-02-21: 1000 mL

## 2014-02-21 MED ORDER — FENTANYL CITRATE 0.05 MG/ML IJ SOLN
INTRAMUSCULAR | Status: AC
  Filled 2014-02-21: qty 2

## 2014-02-21 MED ORDER — EPHEDRINE SULFATE 50 MG/ML IJ SOLN
INTRAMUSCULAR | Status: AC
  Filled 2014-02-21: qty 1

## 2014-02-21 MED ORDER — BELLADONNA ALKALOIDS-OPIUM 16.2-60 MG RE SUPP
RECTAL | Status: AC
  Filled 2014-02-21: qty 1

## 2014-02-21 MED ORDER — CEFAZOLIN SODIUM-DEXTROSE 2-3 GM-% IV SOLR
2.0000 g | INTRAVENOUS | Status: AC
  Administered 2014-02-21: 2 g via INTRAVENOUS

## 2014-02-21 MED ORDER — FENTANYL CITRATE 0.05 MG/ML IJ SOLN: 25.0000 ug | INTRAMUSCULAR | Status: DC | PRN

## 2014-02-21 SURGICAL SUPPLY — 16 items
BAG URO CATCHER STRL LF (DRAPE) ×3 IMPLANT
BASKET ZERO TIP 1.9FR (BASKET) ×3 IMPLANT
CATH URET 5FR 28IN OPEN ENDED (CATHETERS) ×3 IMPLANT
CLOTH BEACON ORANGE TIMEOUT ST (SAFETY) ×3 IMPLANT
DRAPE CAMERA CLOSED 9X96 (DRAPES) ×3 IMPLANT
GLOVE BIOGEL PI IND STRL 7.0 (GLOVE) ×4 IMPLANT
GLOVE BIOGEL PI INDICATOR 7.0 (GLOVE) ×2
GLOVE SURG SS PI 8.0 STRL IVOR (GLOVE) IMPLANT
GOWN STRL REUS W/TWL XL LVL3 (GOWN DISPOSABLE) ×6 IMPLANT
GUIDEWIRE STR DUAL SENSOR (WIRE) ×3 IMPLANT
IV NS IRRIG 3000ML ARTHROMATIC (IV SOLUTION) ×3 IMPLANT
MANIFOLD NEPTUNE II (INSTRUMENTS) ×3 IMPLANT
NS IRRIG 1000ML POUR BTL (IV SOLUTION) ×3 IMPLANT
PACK CYSTO (CUSTOM PROCEDURE TRAY) ×3 IMPLANT
SHEATH ACCESS URETERAL 38CM (SHEATH) ×3 IMPLANT
TUBING CONNECTING 10 (TUBING) ×3 IMPLANT

## 2014-02-21 NOTE — Anesthesia Preprocedure Evaluation (Signed)
Anesthesia Evaluation  Patient identified by MRN, date of birth, ID band Patient awake    Reviewed: Allergy & Precautions, H&P , NPO status , Patient's Chart, lab work & pertinent test results, reviewed documented beta blocker date and time   Airway Mallampati: II TM Distance: >3 FB Neck ROM: full    Dental no notable dental hx. (+) Teeth Intact, Dental Advisory Given   Pulmonary neg pulmonary ROS,  breath sounds clear to auscultation  Pulmonary exam normal       Cardiovascular Exercise Tolerance: Good hypertension, Pt. on home beta blockers and Pt. on medications + CAD, + CABG and + Peripheral Vascular Disease Rhythm:regular Rate:Normal  Sinus bradycardia   Neuro/Psych negative neurological ROS  negative psych ROS   GI/Hepatic negative GI ROS, Neg liver ROS,   Endo/Other  diabetes, Well Controlled, Type 2, Oral Hypoglycemic Agents  Renal/GU negative Renal ROS  negative genitourinary   Musculoskeletal   Abdominal   Peds  Hematology negative hematology ROS (+)   Anesthesia Other Findings   Reproductive/Obstetrics negative OB ROS                           Anesthesia Physical Anesthesia Plan  ASA: III  Anesthesia Plan: General   Post-op Pain Management:    Induction: Intravenous  Airway Management Planned: LMA  Additional Equipment:   Intra-op Plan:   Post-operative Plan:   Informed Consent: I have reviewed the patients History and Physical, chart, labs and discussed the procedure including the risks, benefits and alternatives for the proposed anesthesia with the patient or authorized representative who has indicated his/her understanding and acceptance.   Dental Advisory Given  Plan Discussed with: CRNA and Surgeon  Anesthesia Plan Comments:         Anesthesia Quick Evaluation

## 2014-02-21 NOTE — Anesthesia Postprocedure Evaluation (Signed)
  Anesthesia Post-op Note  Patient: Ethan Mcneil  Procedure(s) Performed: Procedure(s) (LRB): CYSTOSCOPY WITH RETROGRADE PYELOGRAM, URETEROSCOPY AND STENT PLACEMENT (Right) HOLMIUM LASER APPLICATION (Right)  Patient Location: PACU  Anesthesia Type: General  Level of Consciousness: awake and alert   Airway and Oxygen Therapy: Patient Spontanous Breathing  Post-op Pain: mild  Post-op Assessment: Post-op Vital signs reviewed, Patient's Cardiovascular Status Stable, Respiratory Function Stable, Patent Airway and No signs of Nausea or vomiting  Last Vitals:  Filed Vitals:   02/21/14 2000  BP: 130/57  Pulse: 60  Temp:   Resp: 16    Post-op Vital Signs: stable   Complications: No apparent anesthesia complications

## 2014-02-21 NOTE — Brief Op Note (Signed)
02/21/2014  7:46 PM  PATIENT:  Ethan Mcneil  80 y.o. male  PRE-OPERATIVE DIAGNOSIS:  RIGHT URETERAL STONE  POST-OPERATIVE DIAGNOSIS:  same  PROCEDURE:  Procedure(s): CYSTOSCOPY WITH RETROGRADE PYELOGRAM and  URETEROSCOPIC STONE EXTRACTION (Right)   SURGEON:  Surgeon(s) and Role:    * Irine Seal, MD - Primary  PHYSICIAN ASSISTANT:   ASSISTANTS: none   ANESTHESIA:   general  EBL:     BLOOD ADMINISTERED:none  DRAINS: none   LOCAL MEDICATIONS USED:  NONE  SPECIMEN:  Source of Specimen:  right ureteral stone  DISPOSITION OF SPECIMEN:  to family  COUNTS:  YES  TOURNIQUET:  * No tourniquets in log *  DICTATION: .Other Dictation: Dictation Number [id number]  PLAN OF CARE: Discharge to home after PACU  PATIENT DISPOSITION:  PACU - hemodynamically stable.   Delay start of Pharmacological VTE agent (>24hrs) due to surgical blood loss or risk of bleeding: not applicable

## 2014-02-21 NOTE — Interval H&P Note (Signed)
History and Physical Interval Note:  He returned to the office today with severe pain and no change in the location of his right distal stone on KUB.   He is to have ureteroscopic stone extraction today.   02/21/2014 6:58 PM  Ethan Mcneil  has presented today for surgery, with the diagnosis of RIGHT URETERAL STONE  The various methods of treatment have been discussed with the patient and family. After consideration of risks, benefits and other options for treatment, the patient has consented to  Procedure(s): CYSTOSCOPY WITH RETROGRADE PYELOGRAM, URETEROSCOPY AND STENT PLACEMENT (Right) HOLMIUM LASER APPLICATION (Right) as a surgical intervention .  The patient's history has been reviewed, patient examined, no change in status, stable for surgery.  I have reviewed the patient's chart and labs.  Questions were answered to the patient's satisfaction.     Ethan Mcneil J

## 2014-02-21 NOTE — Transfer of Care (Signed)
Immediate Anesthesia Transfer of Care Note  Patient: Ethan Mcneil  Procedure(s) Performed: Procedure(s) (LRB): CYSTOSCOPY WITH RETROGRADE PYELOGRAM, URETEROSCOPY AND STENT PLACEMENT (Right) HOLMIUM LASER APPLICATION (Right)  Patient Location: PACU  Anesthesia Type: General  Level of Consciousness: sedated, patient cooperative and responds to stimulation  Airway & Oxygen Therapy: Patient Spontanous Breathing and Patient connected to face mask oxgen  Post-op Assessment: Report given to PACU RN and Post -op Vital signs reviewed and stable  Post vital signs: Reviewed and stable  Complications: No apparent anesthesia complications

## 2014-02-21 NOTE — Discharge Instructions (Signed)
Cystoscopy, Care After   Refer to this sheet in the next few weeks. These instructions provide you with information on caring for yourself after your procedure. Your caregiver may also give you more specific instructions. Your treatment has been planned according to current medical practices, but problems sometimes occur. Call your caregiver if you have any problems or questions after your procedure.   HOME CARE INSTRUCTIONS   Things you can do to ease any discomfort after your procedure include:   Drinking enough water and fluids to keep your urine clear or pale yellow.   Taking a warm bath to relieve any burning feelings.  SEEK IMMEDIATE MEDICAL CARE IF:   You have an increase in blood in your urine.   You notice blood clots in your urine.   You have difficulty passing urine.   You have the chills.   You have abdominal pain.   You have a fever or persistent symptoms for more than 2 3 days.   You have a fever and your symptoms suddenly get worse.  MAKE SURE YOU:   Understand these instructions.   Will watch your condition.   Will get help right away if you are not doing well or get worse.  Document Released: 06/21/2005 Document Revised: 08/04/2013 Document Reviewed: 05/25/2012   Exit[employer] Patient Information 2014 [employer].

## 2014-02-21 NOTE — H&P (View-Only) (Signed)
I have been asked to see the patient by Dr. Leonard Schwartz, M.D. , for evaluation and management of right obstructing distal ureteral stone.  History of present illness: This is a 80 year old male with a history of nephrolithiasis, followed by Dr. Festus Aloe, who presented to the emergency department early this morning after 6 hours of intense right-sided renal colic. The pain was rapidly progressing and he was unable to control it by taking Vicodin. He has associated nausea and emesis. The pain began in his flank and radiated down to his right testicle. He had associated urinary frequency and urgency. The patient denies any hematuria or dysuria. He has not had any fevers/chills. The patient has a known history of kidney stones and most recently passed some kidney stones in December 2014.  He's never had any kidney stone operations.  In the emergency department the patient was given IV pain medication. His pain significantly improved following this.  Review of systems: A 12 point comprehensive review of systems was obtained and is negative unless otherwise stated in the history of present illness.  Patient Active Problem List   Diagnosis Date Noted  . Abnormal thyroid blood test 04/21/2012  . DJD (degenerative joint disease) 04/21/2012  . HEPATIC CYST 10/03/2010  . BRADYCARDIA 05/02/2009  . PVD 05/02/2009  . HYPERPLASIA PROSTATE UNS W/O UR OBST & OTH LUTS 12/30/2008  . Type II or unspecified type diabetes mellitus without mention of complication, uncontrolled 07/04/2008  . HYPERLIPIDEMIA 11/03/2007  . HYPERTENSION, ESSENTIAL NOS 11/03/2007  . C A D 11/03/2007  . POSTSURGICAL AORTOCORONARY BYPASS STATUS 10/30/2007  . HERNIORRHAPHY, HX OF 10/30/2007    No current facility-administered medications on file prior to encounter.   Current Outpatient Prescriptions on File Prior to Encounter  Medication Sig Dispense Refill  . aspirin 81 MG tablet Take 81 mg by mouth daily.       Randell Loop Prim-Borage (FLAX OIL XTRA) CAPS Take 1 capsule by mouth daily.          Past Medical History  Diagnosis Date  . PVD (peripheral vascular disease)   . Unspecified hyperplasia of prostate without urinary obstruction and other lower urinary tract symptoms (LUTS)   . DM2 (diabetes mellitus, type 2)   . HTN (hypertension)     essential nos  . HLD (hyperlipidemia)   . CAD (coronary artery disease)     CABG 1997; grafts patent @ cath [facility] 11/09  . S/P herniorrhaphy   . Postsurgical aortocoronary bypass status   . Arthritis   . Tick fever 2012    [facility] , [place]  . Gout     after tick bite    Past Surgical History  Procedure Laterality Date  . Coronary artery bypass graft      x7 vessels 1997; cath 2002 and 2009; grafts patent   . Shoulder surgery    . Colonoscopy  2012    polyps   . Inguinal hernia repair      History  Substance Use Topics  . Smoking status: Never Smoker   . Smokeless tobacco: Never Used  . Alcohol Use: No    Family History  Problem Relation Age of Onset  . Lung cancer Father   . Coronary artery disease Mother     stent  . Diabetes Brother     DM   . Prostate cancer Maternal Uncle     in his 38s    PE: Filed Vitals:   02/19/14 0804  BP: 174/64  Pulse: 59  Temp: 98.2 F (36.8 C)  TempSrc: Oral  Resp: 20  SpO2: 100%    Patient appears to be in no acute distress  patient is alert and oriented x3 Atraumatic normocephalic head No cervical or supraclavicular lymphadenopathy appreciated No increased work of breathing, no audible wheezes/rhonchi Regular sinus rhythm/rate Abdomen is soft, nontender, nondistended, no CVA or suprapubic tenderness Lower string is are symmetric without appreciable edema Grossly neurologically intact No identifiable skin lesions   Recent Labs  02/19/14 0841  WBC 11.3*  HGB 12.4*  HCT 35.9*    Recent Labs  02/19/14 0841  NA 132*  K 5.1  CL 94*  CO2 25  GLUCOSE 269*  BUN  19  CREATININE 1.23  CALCIUM 9.3   UA: No evidence of gross hematuria, nitrite and leukocyte negative. Urine culture: Pending  Imaging: CT abdomen/pelvis, stone protocol:  IMPRESSION:Moderate right hydroureteronephrosis due to a 7 mm stone at the right ureterovesical junction. Nonobstructive left kidney stones.Chronic pancreatic duct dilatation.  Imp: Right distal/a UVJ stone with proximal hydronephrosis. The patient's symptoms are currently controlled. There is no evidence of infection here.  Recommendations: I recommended to the patient that we attempt medical expulsion therapy including Flomax, pain medication, anti-nausea medication and fluids. He did also discuss progressive intervention suggest ureteroscopy and shockwave. However due to taken to the operating room today for ureteroscopy and stent placement, but the patient would like to proceed with medical expulsion therapy initially. We'll plan to get him into our clinic early next week for followup. In the interim, I given the patient information as to when to return to the emergency department and what constitutes an emergency.   Louis Meckel W

## 2014-02-22 ENCOUNTER — Encounter (HOSPITAL_BASED_OUTPATIENT_CLINIC_OR_DEPARTMENT_OTHER): Admission: RE | Payer: Self-pay | Source: Ambulatory Visit

## 2014-02-22 ENCOUNTER — Encounter (HOSPITAL_COMMUNITY): Payer: Self-pay | Admitting: Urology

## 2014-02-22 ENCOUNTER — Ambulatory Visit (HOSPITAL_BASED_OUTPATIENT_CLINIC_OR_DEPARTMENT_OTHER): Admission: RE | Admit: 2014-02-22 | Payer: Medicare Other | Source: Ambulatory Visit | Admitting: Urology

## 2014-02-22 SURGERY — CYSTOURETEROSCOPY, WITH STENT INSERTION
Anesthesia: General | Laterality: Right

## 2014-02-22 NOTE — Op Note (Signed)
NAMEMarland Kitchen  Mcneil, Ethan               ACCOUNT NO.:  [id number]  MEDICAL RECORD NO.:  [id number]  LOCATION:  [facility]                         FACILITY:  [facility]  PHYSICIAN:  Marshall Cork. Jeffie Pollock, M.D.    DATE OF BIRTH:  1934/08/26  DATE OF PROCEDURE:  02/21/2014 DATE OF DISCHARGE:                              OPERATIVE REPORT   PROCEDURES: 1. Cystoscopy. 2. Right retrograde pyelogram with interpretation. 3. Right ureteroscopic stone extraction.  PREOPERATIVE DIAGNOSIS:  Right distal ureteral stone.  POSTOPERATIVE DIAGNOSIS:  Right distal ureteral stone with small ureterocele.  SURGEON:  Marshall Cork. Jeffie Pollock, MD  ANESTHESIA:  General.  SPECIMEN:  Stone.  DRAINS:  None.  ESTIMATED BLOOD LOSS:  None.  COMPLICATIONS:  None.  INDICATIONS:  [name] is a 80 year old white male with a 6-mm right distal ureteral stone who was seen over the weekend and returned to the office today with severe pain.  It was felt that ureteroscopy was indicated.  FINDINGS OF PROCEDURE:  He was taken to the operating room, where he was given 2 g of Ancef.  General anesthetic was induced.  He was placed in lithotomy position and was fitted with PAS hose.  Perineum and genitalia were prepped with Betadine solution.  He was draped in usual sterile fashion.  Cystoscopy was performed using the 22-French scope and 12-degree lens. Examination revealed a normal urethra.  The external sphincter was intact.  The prostatic urethra was approximately 3 cm in length with bilobar hyperplasia with mild obstruction.  Examination of bladder revealed mild trabeculation.  No tumors, stones, or inflammation were noted.  The left ureteral orifice was unremarkable.  The right ureteral orifice had the appearance of a ureterocele membrane, but the opening of the meatus was fairly generous and the stone could be seen bulging onto the mucosa.  A 5-French open-end catheter was inserted in the orifice on the right and contrast was  instilled.  Right retrograde pyelogram demonstrated the stone flushing back to the mid ureter, but no other abnormalities.  A guidewire was then passed to the kidney.  On the right, the cystoscope was removed and a 14-French 38-cm access sheath was used to dilate the distal ureter.  This was without difficulty.  The 6.4-French short ureteroscope was then inserted per urethra and advanced alongside the wire.  The stone was visualized in the upper distal ureter and was grasped with a Nitinol basket and removed without difficulty.  The ureteroscope was then removed.  The cystoscope was inserted over the wire and the wire was removed.  The bladder was drained.  It was not felt that the stent was indicated.  The patient was taken down from lithotomy position.  His anesthetic was reversed.  He was moved to the recovery room in stable condition.  There were no complications.     Marshall Cork. Jeffie Pollock, M.D.     JJW/MEDQ  D:  02/21/2014  T:  02/22/2014  Job:  [id number]

## 2014-03-03 ENCOUNTER — Telehealth: Payer: Self-pay

## 2014-03-04 NOTE — Telephone Encounter (Addendum)
LM with work for patient to return call   Medication List and allergies:  Allergies only (patient will bring his updated med list)  90 day supply/mail order: na Local prescriptions: [facility]  Immunizations due:  See below  A/P:   Reviewed and updated FH, PSH & Personal Hx Flu vaccine--did not get this season (should he get now?) Tdap--Unsure of last one PNA--04/2010 Shingles--Would like to get if recommended [facility]--07/2011--Dr Perry-adenomatous polyps--next due 2017 PSA--04/2010--1.19 Last eye exam on file--02/2011--no retinopathy  To Discuss with Provider: Not at this time

## 2014-03-07 ENCOUNTER — Encounter: Payer: Self-pay | Admitting: Internal Medicine

## 2014-03-07 ENCOUNTER — Ambulatory Visit (INDEPENDENT_AMBULATORY_CARE_PROVIDER_SITE_OTHER): Payer: Medicare Other | Admitting: Internal Medicine

## 2014-03-07 VITALS — BP 112/63 | HR 57 | Temp 97.9°F | Ht 70.0 in | Wt 168.6 lb

## 2014-03-07 DIAGNOSIS — Z23 Encounter for immunization: Secondary | ICD-10-CM | POA: Diagnosis not present

## 2014-03-07 DIAGNOSIS — I10 Essential (primary) hypertension: Secondary | ICD-10-CM

## 2014-03-07 DIAGNOSIS — Z0000 Encounter for general adult medical examination without abnormal findings: Secondary | ICD-10-CM | POA: Diagnosis not present

## 2014-03-07 DIAGNOSIS — E039 Hypothyroidism, unspecified: Secondary | ICD-10-CM

## 2014-03-07 DIAGNOSIS — I739 Peripheral vascular disease, unspecified: Secondary | ICD-10-CM

## 2014-03-07 DIAGNOSIS — I2510 Atherosclerotic heart disease of native coronary artery without angina pectoris: Secondary | ICD-10-CM | POA: Diagnosis not present

## 2014-03-07 DIAGNOSIS — R600 Localized edema: Secondary | ICD-10-CM | POA: Insufficient documentation

## 2014-03-07 DIAGNOSIS — R609 Edema, unspecified: Secondary | ICD-10-CM

## 2014-03-07 DIAGNOSIS — E1165 Type 2 diabetes mellitus with hyperglycemia: Secondary | ICD-10-CM

## 2014-03-07 DIAGNOSIS — IMO0001 Reserved for inherently not codable concepts without codable children: Secondary | ICD-10-CM | POA: Diagnosis not present

## 2014-03-07 DIAGNOSIS — E782 Mixed hyperlipidemia: Secondary | ICD-10-CM

## 2014-03-07 LAB — BASIC METABOLIC PANEL
BUN: 17 mg/dL (ref 6–23)
CO2: 27 mEq/L (ref 19–32)
Calcium: 9.2 mg/dL (ref 8.4–10.5)
Chloride: 103 mEq/L (ref 96–112)
Creatinine, Ser: 1.1 mg/dL (ref 0.4–1.5)
GFR: 71.12 mL/min (ref >60.00)
Glucose, Bld: 166 mg/dL — ABNORMAL HIGH (ref 70–99)
Potassium: 4 mEq/L (ref 3.5–5.1)
Sodium: 137 mEq/L (ref 135–145)

## 2014-03-07 LAB — HEMOGLOBIN A1C: Hgb A1c MFr Bld: 9.6 % — ABNORMAL HIGH (ref 4.6–6.5)

## 2014-03-07 LAB — TSH: TSH: 1.91 u[IU]/mL (ref 0.35–5.50)

## 2014-03-07 MED ORDER — ZOSTER VACCINE LIVE 19400 UNT/0.65ML ~~LOC~~ SOLR
0.6500 mL | Freq: Once | SUBCUTANEOUS | Status: DC
Start: 2014-03-07 — End: 2014-06-29

## 2014-03-07 NOTE — Progress Notes (Signed)
Pre visit review using our clinic review tool, if applicable. No additional management support is needed unless otherwise documented below in the visit note. 

## 2014-03-07 NOTE — Progress Notes (Signed)
Subjective:    Patient ID: Ethan Mcneil, male    DOB: December 11, 1934, 80 y.o.   MRN: [id number]  DOS:  03/07/2014 Type of  visit: New pt, transferring from Dr Linna Darner, needs a CPX  Here for Medicare AWV:  1. Risk factors based on Past M, S, F history: reviewed 2. Physical Activities: active in his shop, takes walks during the summer   3. Depression/mood: neg screening  4. Hearing:  Some problems per wife, last test ~ 7 years ago, not interfering w/ ADLs; chronic tinnitus, at baseline 5. ADL's:   independent 6. Fall Risk: no recent falls, see instructios 7. home Safety: does feel safe at home  8. Height, weight, & visual acuity: see VS,  9. Counseling: provided 10. Labs ordered based on risk factors: if needed  11. Referral Coordination: if needed 12. Care Plan, see assessment and plan  13. Cognitive Assessment:Motor skills and cognition appropriate for age  In addition, today we discussed the following: Hypothyroidism, good compliance of medication Diabetes, good compliance w/  medications, no apparent side effects High cholesterol of statins, no apparent side effects Hypertension, taking medications regularly, not ambulatory BPs. In the last 6 months has developed left lower extremity edema, not worse at the end of the day (seems to be consistent throughout the day). Denies calf pain or claudication per se    ROS No  CP, SOB No palpitations, no lower extremity edema Denies  nausea, vomiting diarrhea Denies  blood in the stools (-) cough, sputum production (-) wheezing, chest congestion Recovering from kidney stones , no F/C  No headaches. Denies diplopia. Denies dizziness   Past Medical History  Diagnosis Date  . PVD (peripheral vascular disease)   . Unspecified hyperplasia of prostate without urinary obstruction and other lower urinary tract symptoms (LUTS)   . DM2 (diabetes mellitus, type 2)   . HTN (hypertension)     essential nos  . HLD (hyperlipidemia)   . CAD  (coronary artery disease)     CABG 1997; grafts patent @ cath [facility] 11/09  . S/P herniorrhaphy   . Postsurgical aortocoronary bypass status   . Arthritis   . Tick fever 2012    [facility] , [place]  . Gout     after tick bite  . Chronic kidney disease     kidney stones    Past Surgical History  Procedure Laterality Date  . Coronary artery bypass graft      x7 vessels 1997; cath 2002 and 2009; grafts patent   . Shoulder surgery    . Colonoscopy  2012    polyps   . Inguinal hernia repair    . Cystoscopy with retrograde pyelogram, ureteroscopy and stent placement Right 02/21/2014    Procedure: CYSTOSCOPY WITH RIGHT  RETROGRADE PYELOGRAM,RIGHT  URETEROSCOPY WITH STONE BASKETING EXTRACTION;  Surgeon: Irine Seal, MD;  Location: [facility];  Service: Urology;  Laterality: Right;    History   Social History  . Marital Status: Married    Spouse Name: N/A    Number of Children: N/A  . Years of Education: N/A   Occupational History  . Not on file.   Social History Main Topics  . Smoking status: Never Smoker   . Smokeless tobacco: Never Used  . Alcohol Use: No  . Drug Use: No  . Sexual Activity: Not on file   Other Topics Concern  . Not on file   Social History Narrative   Low fat.  Buyer, retail and former race Nutritional therapist   Regular exercise but no walks      Radio producer form signed appointing wife - [name]; ok to leave msg on home phone [phone]         Family History  Problem Relation Age of Onset  . Lung cancer Father   . Coronary artery disease Mother     stent  . Diabetes Brother     DM   . Prostate cancer Maternal Uncle     in his 70s  . Colon cancer Neg Hx   . Stroke Other     GF         Medication List       This list is accurate as of: 03/07/14 10:17 AM.  Always use your most recent med list.               aspirin 81 MG tablet  Take 81 mg by mouth daily.     FLAX OIL XTRA Caps  Take 1 capsule by mouth daily.      KOMBIGLYZE XR 04-999 MG Tb24  Generic drug:  Saxagliptin-Metformin  Take 1 tablet by mouth daily.     levothyroxine 25 MCG tablet  Commonly known as:  SYNTHROID, LEVOTHROID  Take 25 mcg by mouth daily before breakfast.     metoprolol 50 MG tablet  Commonly known as:  LOPRESSOR  Take 25 mg by mouth 2 (two) times daily.     ramipril 2.5 MG capsule  Commonly known as:  ALTACE  Take 2.5 mg by mouth daily.     simvastatin 20 MG tablet  Commonly known as:  ZOCOR  Take 20 mg by mouth daily.           Objective:   Physical Exam BP 112/63  Pulse 57  Temp(Src) 97.9 F (36.6 C) (Oral)  Ht 5\' 10"  (1.778 m)  Wt 168 lb 9.6 oz (76.476 kg)  BMI 24.19 kg/m2  SpO2 98% General -- alert, well-developed, NAD.  Neck --no thyromegaly , normal carotid pulse  HEENT-- Not pale.  Lungs -- normal respiratory effort, no intercostal retractions, no accessory muscle use, and normal breath sounds.  Heart-- normal rate, regular rhythm, no murmur.  Abdomen-- Not distended, good bowel sounds,soft, non-tender. No bruit  Extremities--  Soft edema +/+++from the L mid pretibial area down to ankle and foot. Calves not tender. + pedal pulses B. Normal cap refills B Neurologic--  alert & oriented X3. Speech normal, gait normal, strength normal in all extremities.  Psych-- Cognition and judgment appear intact. Cooperative with normal attention span and concentration. No anxious or depressed appearing.      Assessment & Plan:

## 2014-03-07 NOTE — Patient Instructions (Signed)
Get your blood work before you leave   Next visit is for routine check up regards your blood sugar   in 3 months  No need to come back fasting Please make an appointment       Fall Prevention and [place] cause injuries and can affect all age groups. It is possible to use preventive measures to significantly decrease the likelihood of falls. There are many simple measures which can make your home safer and prevent falls. OUTDOORS  Repair cracks and edges of walkways and driveways.  Remove high doorway thresholds.  Trim shrubbery on the main path into your home.  Have good outside lighting.  Clear walkways of tools, rocks, debris, and clutter.  Check that handrails are not broken and are securely fastened. Both sides of steps should have handrails.  Have leaves, snow, and ice cleared regularly.  Use sand or salt on walkways during winter months.  In the garage, clean up grease or oil spills. BATHROOM  Install night lights.  Install grab bars by the toilet and in the tub and shower.  Use non-skid mats or decals in the tub or shower.  Place a plastic non-slip stool in the shower to sit on, if needed.  Keep floors dry and clean up all water on the floor immediately.  Remove soap buildup in the tub or shower on a regular basis.  Secure bath mats with non-slip, double-sided rug tape.  Remove throw rugs and tripping hazards from the floors. BEDROOMS  Install night lights.  Make sure a bedside light is easy to reach.  Do not use oversized bedding.  Keep a telephone by your bedside.  Have a firm chair with side arms to use for getting dressed.  Remove throw rugs and tripping hazards from the floor. KITCHEN  Keep handles on pots and pans turned toward the center of the stove. Use back burners when possible.  Clean up spills quickly and allow time for drying.  Avoid walking on wet floors.  Avoid hot utensils and knives.  Position shelves so they are  not too high or low.  Place commonly used objects within easy reach.  If necessary, use a sturdy step stool with a grab bar when reaching.  Keep electrical cables out of the way.  Do not use floor polish or wax that makes floors slippery. If you must use wax, use non-skid floor wax.  Remove throw rugs and tripping hazards from the floor. STAIRWAYS  Never leave objects on stairs.  Place handrails on both sides of stairways and use them. Fix any loose handrails. Make sure handrails on both sides of the stairways are as long as the stairs.  Check carpeting to make sure it is firmly attached along stairs. Make repairs to worn or loose carpet promptly.  Avoid placing throw rugs at the top or bottom of stairways, or properly secure the rug with carpet tape to prevent slippage. Get rid of throw rugs, if possible.  Have an electrician put in a light switch at the top and bottom of the stairs. OTHER FALL PREVENTION TIPS  Wear low-heel or rubber-soled shoes that are supportive and fit well. Wear closed toe shoes.  When using a stepladder, make sure it is fully opened and both spreaders are firmly locked. Do not climb a closed stepladder.  Add color or contrast paint or tape to grab bars and handrails in your home. Place contrasting color strips on first and last steps.  Learn and use mobility  aids as needed. Install an electrical emergency response system.  Turn on lights to avoid dark areas. Replace light bulbs that burn out immediately. Get light switches that glow.  Arrange furniture to create clear pathways. Keep furniture in the same place.  Firmly attach carpet with non-skid or double-sided tape.  Eliminate uneven floor surfaces.  Select a carpet pattern that does not visually hide the edge of steps.  Be aware of all pets. OTHER HOME SAFETY TIPS  Set the water temperature for 120 F (48.8 C).  Keep emergency numbers on or near the telephone.  Keep smoke detectors on  every level of the home and near sleeping areas. Document Released: 11/22/2002 Document Revised: 06/02/2012 Document Reviewed: 02/21/2012 [facility] Patient Information 2014 [place].

## 2014-03-07 NOTE — Assessment & Plan Note (Addendum)
Needs better control, labs. Depending on the results he may need basal insulin, NPH?

## 2014-03-07 NOTE — Assessment & Plan Note (Signed)
Left lower extremity edema for 6 months, likely due to to previous surgery, saphenous vein was harvested from the left leg. To be sure we'll get a ultrasound

## 2014-03-07 NOTE — Assessment & Plan Note (Signed)
Well-controlled per last cholesterol panel

## 2014-03-07 NOTE — Assessment & Plan Note (Signed)
CABG 97 patent grafts cath 2009 . Patient remains asymptomatic, last visit with cardiology 5 years ago. Plan: Continue controlling cardiovascular risk factors

## 2014-03-07 NOTE — Assessment & Plan Note (Signed)
Good compliance of medication, labs 

## 2014-03-07 NOTE — Assessment & Plan Note (Addendum)
Td today  Did not get a flu shot  PNM 23 shot-- 2011 Needs PNM 13, not available today zostavax never, rx provided   [facility]--07/2011--Dr Perry-adenomatous polyps--next due 2017   PSA-- sees urology frequently

## 2014-03-07 NOTE — Assessment & Plan Note (Signed)
Carries the  diagnosis of PVD. Patient is asymptomatic, I don't see reports of ABIs in the chart. Plan: Observation for now

## 2014-03-07 NOTE — Assessment & Plan Note (Signed)
Symptoms well-controlled, recent creatinine was elevated in the setting of kidney stones. Plan: Labs

## 2014-03-08 ENCOUNTER — Telehealth: Payer: Self-pay | Admitting: Internal Medicine

## 2014-03-08 DIAGNOSIS — R600 Localized edema: Secondary | ICD-10-CM

## 2014-03-08 NOTE — Addendum Note (Signed)
Addended by: Peggyann Shoals on: 03/08/2014 04:43 PM   Modules accepted: Orders

## 2014-03-08 NOTE — Telephone Encounter (Signed)
Relevant patient education assigned to patient using Emmi. ° °

## 2014-03-15 ENCOUNTER — Telehealth: Payer: Self-pay

## 2014-03-15 ENCOUNTER — Ambulatory Visit (HOSPITAL_COMMUNITY): Payer: Medicare Other | Attending: Internal Medicine | Admitting: Cardiology

## 2014-03-15 DIAGNOSIS — M7989 Other specified soft tissue disorders: Secondary | ICD-10-CM

## 2014-03-15 DIAGNOSIS — R609 Edema, unspecified: Secondary | ICD-10-CM | POA: Diagnosis not present

## 2014-03-15 DIAGNOSIS — R600 Localized edema: Secondary | ICD-10-CM

## 2014-03-15 NOTE — Telephone Encounter (Signed)
Relevant patient education assigned to patient using Emmi. ° °

## 2014-03-15 NOTE — Progress Notes (Signed)
Lower venous duplex limited

## 2014-03-17 ENCOUNTER — Encounter: Payer: Self-pay | Admitting: Internal Medicine

## 2014-03-17 ENCOUNTER — Ambulatory Visit (INDEPENDENT_AMBULATORY_CARE_PROVIDER_SITE_OTHER): Payer: Medicare Other | Admitting: Internal Medicine

## 2014-03-17 VITALS — BP 110/58 | HR 60 | Temp 98.0°F | Wt 168.0 lb

## 2014-03-17 DIAGNOSIS — IMO0001 Reserved for inherently not codable concepts without codable children: Secondary | ICD-10-CM

## 2014-03-17 DIAGNOSIS — E1165 Type 2 diabetes mellitus with hyperglycemia: Principal | ICD-10-CM

## 2014-03-17 DIAGNOSIS — I2510 Atherosclerotic heart disease of native coronary artery without angina pectoris: Secondary | ICD-10-CM | POA: Diagnosis not present

## 2014-03-17 MED ORDER — GLIMEPIRIDE 2 MG PO TABS: 2.0000 mg | ORAL_TABLET | Freq: Every day | ORAL | Status: DC

## 2014-03-17 NOTE — Patient Instructions (Signed)
Continue taking the same medications, in addition start glimepiride one tablet in the morning.  Check your blood sugars at least once a day and if you feel your sugar is low If   is less than 90 definitely take some orange juice or sugar.   Two great books to learn about diabetes Diabetes fro Dummies "The [facility] Diabetes diet" book   Next visit in 3 months    Hypoglycemia (Low Blood Sugar) Hypoglycemia is when the glucose (sugar) in your blood is too low. Hypoglycemia can happen for many reasons. It can happen to people with or without diabetes. Hypoglycemia can develop quickly and can be a medical emergency.  CAUSES  Having hypoglycemia does not mean that you will develop diabetes. Different causes include:  Missed or delayed meals or not enough carbohydrates eaten.  Medication overdose. This could be by accident or deliberate. If by accident, your medication may need to be adjusted or changed.  Exercise or increased activity without adjustments in carbohydrates or medications.  A nerve disorder that affects body functions like your heart rate, blood pressure and digestion (autonomic neuropathy).  A condition where the stomach muscles do not function properly (gastroparesis). Therefore, medications may not absorb properly.  The inability to recognize the signs of hypoglycemia (hypoglycemic unawareness).  Absorption of insulin  may be altered.  Alcohol consumption.  Pregnancy/menstrual cycles/postpartum. This may be due to hormones.  Certain kinds of tumors. This is very rare. SYMPTOMS   Sweating.  Hunger.  Dizziness.  Blurred vision.  Drowsiness.  Weakness.  Headache.  Rapid heart beat.  Shakiness.  Nervousness. DIAGNOSIS  Diagnosis is made by monitoring blood glucose in one or all of the following ways:  Fingerstick blood glucose monitoring.  Laboratory results. TREATMENT  If you think your blood glucose is low:  Check your blood glucose, if  possible. If it is less than 70 mg/dl, take one of the following:  3-4 glucose tablets.   cup juice (prefer clear like apple).   cup "regular" soda pop.  1 cup milk.  -1 tube of glucose gel.  5-6 hard candies.  Do not over treat because your blood glucose (sugar) will only go too high.  Wait 15 minutes and recheck your blood glucose. If it is still less than 70 mg/dl (or below your target range), repeat treatment.  Eat a snack if it is more than one hour until your next meal. Sometimes, your blood glucose may go so low that you are unable to treat yourself. You may need someone to help you. You may even pass out or be unable to swallow. This may require you to get an injection of glucagon, which raises the blood glucose. HOME CARE INSTRUCTIONS  Check blood glucose as recommended by your caregiver.  Take medication as prescribed by your caregiver.  Follow your meal plan. Do not skip meals. Eat on time.  If you are going to drink alcohol, drink it only with meals.  Check your blood glucose before driving.  Check your blood glucose before and after exercise. If you exercise longer or different than usual, be sure to check blood glucose more frequently.  Always carry treatment with you. Glucose tablets are the easiest to carry.  Always wear medical alert jewelry or carry some form of identification that states that you have diabetes. This will alert people that you have diabetes. If you have hypoglycemia, they will have a better idea on what to do. SEEK MEDICAL CARE IF:   You  are having problems keeping your blood sugar at target range.  You are having frequent episodes of hypoglycemia.  You feel you might be having side effects from your medicines.  You have symptoms of an illness that is not improving after 3-4 days.  You notice a change in vision or a new problem with your vision. SEEK IMMEDIATE MEDICAL CARE IF:   You are a family member or friend of a person whose  blood glucose goes below 70 mg/dl and is accompanied by:  Confusion.  A change in mental status.  The inability to swallow.  Passing out. Document Released: 12/02/2005 Document Revised: 02/24/2012 Document Reviewed: 03/30/2012 [facility] Patient Information 2014 [place], [place].

## 2014-03-17 NOTE — Progress Notes (Signed)
Subjective:    Patient ID: Ethan Mcneil, male    DOB: 1934/11/11, 80 y.o.   MRN: [id number]  DOS:  03/17/2014 Type of  visit: Followup from previous visit  To discuss A1C  Past Medical History  Diagnosis Date  . PVD (peripheral vascular disease)   . Unspecified hyperplasia of prostate without urinary obstruction and other lower urinary tract symptoms (LUTS)   . DM2 (diabetes mellitus, type 2)   . HTN (hypertension)     essential nos  . HLD (hyperlipidemia)   . CAD (coronary artery disease)     CABG 1997; grafts patent @ cath [facility] 11/09  . S/P herniorrhaphy   . Postsurgical aortocoronary bypass status   . Arthritis   . Tick fever 2012    [facility] , [place]  . Gout     after tick bite  . Kidney stones     kidney stones  . Hypothyroid     Past Surgical History  Procedure Laterality Date  . Coronary artery bypass graft      x7 vessels 1997; cath 2002 and 2009; grafts patent   . Shoulder surgery    . Colonoscopy  2012    polyps   . Inguinal hernia repair    . Cystoscopy with retrograde pyelogram, ureteroscopy and stent placement Right 02/21/2014    Procedure: CYSTOSCOPY WITH RIGHT  RETROGRADE PYELOGRAM,RIGHT  URETEROSCOPY WITH STONE BASKETING EXTRACTION;  Surgeon: Irine Seal, MD;  Location: [facility];  Service: Urology;  Laterality: Right;    History   Social History  . Marital Status: Married    Spouse Name: N/A    Number of Children: 4  . Years of Education: N/A   Occupational History  . retired, works few hours     Social History Main Topics  . Smoking status: Never Smoker   . Smokeless tobacco: Never Used  . Alcohol Use: No  . Drug Use: No  . Sexual Activity: Not on file   Other Topics Concern  . Not on file   Social History Narrative   Buyer, retail and former race car driver   4 children (boys)   Designated party form signed appointing wife - [name]; ok to leave msg on home phone [phone]                 Medication  List       This list is accurate as of: 03/17/14 11:59 PM.  Always use your most recent med list.               aspirin 81 MG tablet  Take 81 mg by mouth daily.     FLAX OIL XTRA Caps  Take 1 capsule by mouth daily.     glimepiride 2 MG tablet  Commonly known as:  AMARYL  Take 1 tablet (2 mg total) by mouth daily with breakfast.     KOMBIGLYZE XR 04-999 MG Tb24  Generic drug:  Saxagliptin-Metformin  Take 1 tablet by mouth daily.     levothyroxine 25 MCG tablet  Commonly known as:  SYNTHROID, LEVOTHROID  Take 25 mcg by mouth daily before breakfast.     metoprolol 50 MG tablet  Commonly known as:  LOPRESSOR  Take 25 mg by mouth 2 (two) times daily.     ramipril 2.5 MG capsule  Commonly known as:  ALTACE  Take 2.5 mg by mouth daily.     simvastatin 20 MG tablet  Commonly known as:  ZOCOR  Take 20 mg by mouth daily.     zoster vaccine live (PF) 19400 UNT/0.65ML injection  Commonly known as:  ZOSTAVAX  Inject 19,400 Units into the skin once.           Objective:   Physical Exam BP 110/58  Pulse 60  Temp(Src) 98 F (36.7 C)  Wt 168 lb (76.204 kg)  SpO2 100% General -- alert, well-developed, NAD.     Psych-- Cognition and judgment appear intact. Cooperative with normal attention span and concentration. No anxious or depressed appearing.     Assessment & Plan:   Today , I spent more than 15   min with the patient, >50% of the time counseling

## 2014-03-17 NOTE — Progress Notes (Signed)
Pre visit review using our clinic review tool, if applicable. No additional management support is needed unless otherwise documented below in the visit note. 

## 2014-03-17 NOTE — Assessment & Plan Note (Addendum)
Here to discuss his wife recent A1c, all previous A1c's review w/ the patient. He has a history of heart disease, his 80 years old. I will set a  A1c goal of around 7.5. Options for diabetes treatment includes : increase metformin dose, restart glimeperide (does not recall s/e) or  insulin. He really does not like to try insulin at this time. Plan: glimeperide Blood sugar symptoms discussed, encouraged to check his blood sugars at least once daily, to carry glucose with him. Followup in 3 months. Diet- exercise discussed

## 2014-03-28 ENCOUNTER — Telehealth: Payer: Self-pay | Admitting: Internal Medicine

## 2014-03-28 MED ORDER — METFORMIN HCL 850 MG PO TABS: 850.0000 mg | ORAL_TABLET | Freq: Two times a day (BID) | ORAL | Status: DC

## 2014-03-28 NOTE — Telephone Encounter (Signed)
Patient called and stated that when her he went to refill his Saxagliptin-Metformin (KOMBIGLYZE XR) 04-999 MG TB24 it was too expensive. Patient is wondering can he come off it or is there something else Dr Larose Kells can prescribe him. Please advise.

## 2014-03-28 NOTE — Telephone Encounter (Signed)
All  Medications in that class  are very expensive thus rec: D/c kombiglyze Call metformin 850 mg 1 po bid #60, 4 RF

## 2014-03-29 NOTE — Telephone Encounter (Signed)
Done

## 2014-04-04 ENCOUNTER — Telehealth: Payer: Self-pay

## 2014-04-04 DIAGNOSIS — N200 Calculus of kidney: Secondary | ICD-10-CM | POA: Diagnosis not present

## 2014-04-04 NOTE — Telephone Encounter (Signed)
Relevant patient education assigned to patient using Emmi. ° °

## 2014-05-03 ENCOUNTER — Other Ambulatory Visit: Payer: Self-pay | Admitting: Internal Medicine

## 2014-05-04 ENCOUNTER — Other Ambulatory Visit: Payer: Self-pay | Admitting: Internal Medicine

## 2014-05-13 ENCOUNTER — Other Ambulatory Visit: Payer: Self-pay | Admitting: Internal Medicine

## 2014-06-06 ENCOUNTER — Encounter: Payer: Self-pay | Admitting: Internal Medicine

## 2014-06-06 ENCOUNTER — Ambulatory Visit (INDEPENDENT_AMBULATORY_CARE_PROVIDER_SITE_OTHER): Payer: Medicare Other | Admitting: Internal Medicine

## 2014-06-06 VITALS — BP 116/71 | HR 50 | Temp 98.2°F | Wt 168.0 lb

## 2014-06-06 DIAGNOSIS — I10 Essential (primary) hypertension: Secondary | ICD-10-CM | POA: Diagnosis not present

## 2014-06-06 DIAGNOSIS — D649 Anemia, unspecified: Secondary | ICD-10-CM

## 2014-06-06 DIAGNOSIS — I2510 Atherosclerotic heart disease of native coronary artery without angina pectoris: Secondary | ICD-10-CM

## 2014-06-06 DIAGNOSIS — IMO0001 Reserved for inherently not codable concepts without codable children: Secondary | ICD-10-CM

## 2014-06-06 DIAGNOSIS — E1165 Type 2 diabetes mellitus with hyperglycemia: Principal | ICD-10-CM

## 2014-06-06 LAB — HEMOGLOBIN: Hemoglobin: 12 g/dL — ABNORMAL LOW (ref 13.0–17.0)

## 2014-06-06 LAB — HEMOGLOBIN A1C: Hgb A1c MFr Bld: 8 % — ABNORMAL HIGH (ref 4.6–6.5)

## 2014-06-06 NOTE — Patient Instructions (Addendum)
Get your blood work before you leave     Next visit is for routine check up regards your blood sugar   in 3 to 4 months  No need to come back fasting Please make an appointment     At some point this year the clinic will relocate to  [place] and [address] (10 minutes form here)  [place]  [place], [place] [address] [phone]

## 2014-06-06 NOTE — Progress Notes (Signed)
Subjective:    Patient ID: Ethan Mcneil, male    DOB: 1934/04/02, 80 y.o.   MRN: [id number]  DOS:  06/06/2014 Type of  Visit: routine History:  Patient in general is feeling well, self decrease metformin to half tablet twice a day because his sugar was Going down in the 50s in the afternoon. current CBGs better around 120,140   ROS Denies chest pain or difficulty breathing No  nausea, vomiting, diarrhea ; does note that metformin make him having more frequent bowel movements  Past Medical History  Diagnosis Date  . PVD (peripheral vascular disease)   . Unspecified hyperplasia of prostate without urinary obstruction and other lower urinary tract symptoms (LUTS)   . DM2 (diabetes mellitus, type 2)   . HTN (hypertension)     essential nos  . HLD (hyperlipidemia)   . CAD (coronary artery disease)     CABG 1997; grafts patent @ cath [facility] 11/09  . S/P herniorrhaphy   . Postsurgical aortocoronary bypass status   . Arthritis   . Tick fever 2012    [facility] , [place]  . Gout     after tick bite  . Kidney stones     kidney stones  . Hypothyroid     Past Surgical History  Procedure Laterality Date  . Coronary artery bypass graft      x7 vessels 1997; cath 2002 and 2009; grafts patent   . Shoulder surgery    . Colonoscopy  2012    polyps   . Inguinal hernia repair    . Cystoscopy with retrograde pyelogram, ureteroscopy and stent placement Right 02/21/2014    Procedure: CYSTOSCOPY WITH RIGHT  RETROGRADE PYELOGRAM,RIGHT  URETEROSCOPY WITH STONE BASKETING EXTRACTION;  Surgeon: Irine Seal, MD;  Location: [facility];  Service: Urology;  Laterality: Right;    History   Social History  . Marital Status: Married    Spouse Name: N/A    Number of Children: 4  . Years of Education: N/A   Occupational History  . retired, works few hours     Social History Main Topics  . Smoking status: Never Smoker   . Smokeless tobacco: Never Used  . Alcohol Use: No  . Drug Use: No   . Sexual Activity: Not on file   Other Topics Concern  . Not on file   Social History Narrative   Buyer, retail and former race car driver   4 children (boys)   Designated party form signed appointing wife - [name]; ok to leave msg on home phone [phone]                 Medication List       This list is accurate as of: 06/06/14 10:35 AM.  Always use your most recent med list.               aspirin 81 MG tablet  Take 81 mg by mouth daily.     FLAX OIL XTRA Caps  Take 1 capsule by mouth daily.     glimepiride 2 MG tablet  Commonly known as:  AMARYL  Take 1 tablet (2 mg total) by mouth daily with breakfast.     levothyroxine 25 MCG tablet  Commonly known as:  SYNTHROID, LEVOTHROID  TAKE ONE TABLET BY MOUTH DAILY     metFORMIN 850 MG tablet  Commonly known as:  GLUCOPHAGE  Take 425 mg by mouth 2 (two) times daily with a meal.  metoprolol 50 MG tablet  Commonly known as:  LOPRESSOR  Take 25 mg by mouth 2 (two) times daily.     ramipril 2.5 MG capsule  Commonly known as:  ALTACE  Take 2.5 mg by mouth daily.     simvastatin 20 MG tablet  Commonly known as:  ZOCOR  Take 20 mg by mouth daily.     zoster vaccine live (PF) 19400 UNT/0.65ML injection  Commonly known as:  ZOSTAVAX  Inject 19,400 Units into the skin once.           Objective:   Physical Exam BP 116/71  Pulse 50  Temp(Src) 98.2 F (36.8 C)  Wt 168 lb (76.204 kg)  SpO2 99%  General -- alert, well-developed, NAD.  Lungs -- normal respiratory effort, no intercostal retractions, no accessory muscle use, and normal breath sounds.  Heart-- normal rate, regular rhythm, no murmur.  DIABETIC FEET EXAM: Normal pedal pulses bilaterally Skin normal, nails normal, no calluses Pinprick examination of the feet normal. Psych-- Cognition and judgment appear intact. Cooperative with normal attention span and concentration. No anxious or depressed appearing.      Assessment & Plan:

## 2014-06-06 NOTE — Progress Notes (Signed)
Pre visit review using our clinic review tool, if applicable. No additional management support is needed unless otherwise documented below in the visit note. 

## 2014-06-06 NOTE — Assessment & Plan Note (Signed)
self decrease metformin to half tablet twice a day because low blood sugars. Labs If not at goal, consider increase metformin and decrease  amaryl. Feet exam normal today Does see he eye doctor, last visit 3 months ago

## 2014-06-08 NOTE — Addendum Note (Signed)
Addended by: Peggyann Shoals on: 06/08/2014 02:43 PM   Modules accepted: Orders

## 2014-06-09 ENCOUNTER — Encounter: Payer: Self-pay | Admitting: *Deleted

## 2014-06-27 ENCOUNTER — Other Ambulatory Visit (INDEPENDENT_AMBULATORY_CARE_PROVIDER_SITE_OTHER): Payer: Medicare Other

## 2014-06-27 DIAGNOSIS — D649 Anemia, unspecified: Secondary | ICD-10-CM

## 2014-06-28 LAB — VITAMIN B12: Vitamin B-12: 147 pg/mL — ABNORMAL LOW (ref 211–911)

## 2014-06-28 LAB — FERRITIN: Ferritin: 46.2 ng/mL (ref 22.0–322.0)

## 2014-06-28 LAB — FOLATE: Folate: 18.8 ng/mL (ref >5.9)

## 2014-06-28 LAB — IRON: Iron: 52 ug/dL (ref 42–165)

## 2014-06-29 ENCOUNTER — Encounter (HOSPITAL_COMMUNITY): Payer: Self-pay | Admitting: Pharmacy Technician

## 2014-06-29 ENCOUNTER — Other Ambulatory Visit: Payer: Self-pay | Admitting: Urology

## 2014-06-29 DIAGNOSIS — N200 Calculus of kidney: Secondary | ICD-10-CM | POA: Diagnosis not present

## 2014-07-08 ENCOUNTER — Encounter (HOSPITAL_COMMUNITY): Payer: Self-pay | Admitting: *Deleted

## 2014-07-10 NOTE — H&P (Signed)
History of Present Illness                         F/u - referred by Dr. Unice Cobble.     1-ureteral stone - Dec 05, 2013 CT A/P a 4 mm right mid-ureteral stone and hydronephrosis. I couldn't't see it on scout. I reviewed the images. His bun was 20, cr 1.6 (cr 0.23 Apr 2013 on Epic). KUB - poor visual -- dilated small bowel loops. Bones appear normal. Might see stone in left kidney. Two - Three phleboliths in right pelvis and one might be the stone.   -Jan 2015 - KUB - stable calcification in pelvis, near the right UVJ there remains a third opacity which might be in the ureter. Stable large left renal stone. Bowel gas pattern has normalized. bones appear normal.   -Mar 2015 - Right URS, laser litho, no stent for right UVJ stone. BUN 19, creatinine 1.2, UA negative, urine culture negative.      2-nephrolithiasis - Dec 05, 2013 CT A/P report (CT in [place] by [facility]) a 4 mm right mid-ureteral stone and hydronephrosis. No right renal stones, three left stones, largest 9 mm (seen on scout). He passed a stone a few yrs ago. No surgery.         Jul 2015 Interval hx    Presents with a gradual onset of some left back pain going on for about 4 days. He has a dull ache which is moderate at times. He's had no constipation or diarrhea. No nausea vomiting. No fevers or chills. He wondered if the left kidney stone removed. He is voiding normally and has no dysuria or hematuria.     Past Medical History Problems  1. History of arthritis (V13.4) 2. History of cardiac disorder (V12.50) 3. History of renal calculi (V13.01)  Surgical History Problems  1. History of Cystoscopy With Ureteroscopy With Removal Of Calculus 2. History of Heart Surgery  Current Meds 1. Aspirin 81 MG Oral Tablet;  Therapy: (Recorded:02Jan2015) to Recorded 2. Flaxseed Oil CAPS;  Therapy: (Recorded:02Jan2015) to Recorded 3. Levothyroxine Sodium 25 MCG Oral Tablet;  Therapy: ([id number]) to  Recorded 4. MetFORMIN HCl - 850 MG Oral Tablet;  Therapy: (Recorded:20Apr2015) to Recorded 5. Metoprolol Tartrate 50 MG Oral Tablet;  Therapy: [phone] to Recorded 6. Morphine Sulfate TABS;  Therapy: (Recorded:09Mar2015) to Recorded 7. Ramipril 2.5 MG Oral Capsule;  Therapy: (Recorded:02Jan2015) to Recorded 8. Simvastatin 20 MG Oral Tablet;  Therapy: (Recorded:02Jan2015) to Recorded  Allergies Medication  1. Codeine Derivatives  Family History Problems  1. Family history of lung cancer (V16.1) : Father  Social History Problems  1. Daily caffeine consumption, 4-5 servings a day 2. Father deceased 3. Four children 4. Married 5. Mother alive 70. Never smoker 7. No alcohol use  Review of Systems Genitourinary, constitutional, skin, eye, otolaryngeal, hematologic/lymphatic, cardiovascular, pulmonary, endocrine, musculoskeletal, gastrointestinal, neurological and psychiatric system(s) were reviewed and pertinent findings if present are noted.    Vitals Vital Signs [Data Includes: Last 1 Day]  Recorded: [id number] 08:32AM  Blood Pressure: 137 / 65 Temperature: 97 F Heart Rate: 44  Physical Exam Constitutional: Well nourished and well developed . No acute distress.  Neuro/Psych:. Mood and affect are appropriate.    Results/Data Urine [Data Includes: Last 1 Day]   [id number]  COLOR YELLOW   APPEARANCE CLEAR   SPECIFIC GRAVITY 1.020   pH 5.5   GLUCOSE NEG mg/dL  BILIRUBIN NEG   KETONE NEG  mg/dL  BLOOD NEG   PROTEIN NEG mg/dL  UROBILINOGEN 0.2 mg/dL  NITRITE NEG   LEUKOCYTE ESTERASE NEG    Procedure KUB today comparison prior KUB and CT, findings: The left kidney has a stable left lower pole stone. I don't appreciate any other stones or calcifications over the ureters or bladder. The right side looks clear. The bones and the bowel gas pattern appeared normal.   Assessment Assessed  1. Nephrolithiasis (592.0)  Plan Health Maintenance  1. UA With REFLEX; [Do Not  Release]; Status:Complete;   [id number] 08:01AM Nephrolithiasis  2. Start: Oxycodone-Acetaminophen 5-325 MG Oral Tablet; TAKE 1 TO 2 TABLETS EVERY 4  TO 6 HOURS AS NEEDED FOR PAIN 3. Follow-up Schedule Surgery Office  Follow-up  Status: Hold For - Appointment   Requested for: 15Jul2015 4. KUB; Status:Resulted - Requires Verification;   Done: [id number] 12:00AM  Discussion/Summary Back pain-unclear etiology. It appears the stone is stable. His vitals are normal today. UA clear.    Left nephrolithiasis - we discussed the nature risks benefits alternatives of continued surveillance, left shockwave lithotripsy or left ureteroscopy with laser lithotripsy and stent. All questions answered. He elects to proceed with shockwave lithotripsy. We discussed he may need a staged treatment.        Signatures Electronically signed by : Festus Aloe, M.D.; Jun 29 2014  9:23AM EST

## 2014-07-11 ENCOUNTER — Encounter (HOSPITAL_COMMUNITY)
Admission: RE | Disposition: A | Discharge status: Home / Self Care | Payer: Self-pay | Source: Ambulatory Visit | Attending: Urology

## 2014-07-11 ENCOUNTER — Ambulatory Visit (HOSPITAL_COMMUNITY): Payer: Medicare Other

## 2014-07-11 ENCOUNTER — Ambulatory Visit (HOSPITAL_COMMUNITY)
Admission: RE | Admit: 2014-07-11 | Discharge: 2014-07-11 | Disposition: A | Discharge status: Home / Self Care | Payer: Medicare Other | Source: Ambulatory Visit | Attending: Urology | Admitting: Urology

## 2014-07-11 ENCOUNTER — Encounter (HOSPITAL_COMMUNITY): Payer: Self-pay | Admitting: *Deleted

## 2014-07-11 DIAGNOSIS — I10 Essential (primary) hypertension: Secondary | ICD-10-CM | POA: Diagnosis not present

## 2014-07-11 DIAGNOSIS — Z01812 Encounter for preprocedural laboratory examination: Secondary | ICD-10-CM | POA: Insufficient documentation

## 2014-07-11 DIAGNOSIS — N200 Calculus of kidney: Secondary | ICD-10-CM | POA: Diagnosis not present

## 2014-07-11 DIAGNOSIS — Z7982 Long term (current) use of aspirin: Secondary | ICD-10-CM | POA: Diagnosis not present

## 2014-07-11 DIAGNOSIS — Z79899 Other long term (current) drug therapy: Secondary | ICD-10-CM | POA: Insufficient documentation

## 2014-07-11 DIAGNOSIS — I499 Cardiac arrhythmia, unspecified: Secondary | ICD-10-CM | POA: Insufficient documentation

## 2014-07-11 DIAGNOSIS — Z01818 Encounter for other preprocedural examination: Secondary | ICD-10-CM | POA: Diagnosis not present

## 2014-07-11 DIAGNOSIS — E119 Type 2 diabetes mellitus without complications: Secondary | ICD-10-CM | POA: Diagnosis not present

## 2014-07-11 DIAGNOSIS — Z951 Presence of aortocoronary bypass graft: Secondary | ICD-10-CM | POA: Insufficient documentation

## 2014-07-11 LAB — GLUCOSE, CAPILLARY: Glucose-Capillary: 138 mg/dL — ABNORMAL HIGH (ref 70–99)

## 2014-07-11 SURGERY — LITHOTRIPSY, ESWL
Anesthesia: LOCAL | Laterality: Left

## 2014-07-11 MED ORDER — CIPROFLOXACIN HCL 500 MG PO TABS
500.0000 mg | ORAL_TABLET | ORAL | Status: AC
  Administered 2014-07-11: 500 mg via ORAL
  Filled 2014-07-11: qty 1

## 2014-07-11 MED ORDER — DIPHENHYDRAMINE HCL 25 MG PO CAPS
25.0000 mg | ORAL_CAPSULE | ORAL | Status: AC
  Administered 2014-07-11: 25 mg via ORAL
  Filled 2014-07-11: qty 1

## 2014-07-11 MED ORDER — TAMSULOSIN HCL 0.4 MG PO CAPS: 0.4000 mg | ORAL_CAPSULE | Freq: Every day | ORAL | Status: DC

## 2014-07-11 MED ORDER — SODIUM CHLORIDE 0.9 % IV SOLN
INTRAVENOUS | Status: DC
  Administered 2014-07-11: 07:00:00 via INTRAVENOUS

## 2014-07-11 MED ORDER — ASPIRIN 81 MG PO TABS: 81.0000 mg | ORAL_TABLET | Freq: Every day | ORAL | Status: DC

## 2014-07-11 MED ORDER — OXYCODONE-ACETAMINOPHEN 5-325 MG PO TABS: 1.0000 | ORAL_TABLET | Freq: Four times a day (QID) | ORAL | Status: DC | PRN

## 2014-07-11 MED ORDER — DIAZEPAM 5 MG PO TABS
10.0000 mg | ORAL_TABLET | ORAL | Status: AC
Start: 2014-07-11 — End: 2014-07-11
  Administered 2014-07-11: 10 mg via ORAL
  Filled 2014-07-11: qty 2

## 2014-07-11 NOTE — Interval H&P Note (Signed)
History and Physical Interval Note:  07/11/2014 7:44 AM  [name]  has presented today for surgery, with the diagnosis of LEFT RENAL STONE  The various methods of treatment have been discussed with the patient and family. After consideration of risks, benefits and other options for treatment, the patient has consented to  Procedure(s): LEFT EXTRACORPOREAL SHOCK WAVE LITHOTRIPSY (ESWL) (Left) as a surgical intervention .  The patient's history has been reviewed, patient examined, no change in status, stable for surgery.  I have reviewed the patient's chart and labs. Pt is well - no dysuria, fever, flank pain. Discussed he may need staged procedure.  Questions were answered to the patient's satisfaction.  He elects to proceed.    Ethan Mcneil

## 2014-07-11 NOTE — Op Note (Signed)
See [facility] Op note -   LEFT ESWL  Left MP/LP 9 mm stone   Needed to gate, pt moved several times

## 2014-07-11 NOTE — Discharge Instructions (Signed)

## 2014-07-14 ENCOUNTER — Other Ambulatory Visit: Payer: Self-pay | Admitting: Internal Medicine

## 2014-07-14 DIAGNOSIS — R112 Nausea with vomiting, unspecified: Secondary | ICD-10-CM | POA: Diagnosis not present

## 2014-07-14 DIAGNOSIS — Z951 Presence of aortocoronary bypass graft: Secondary | ICD-10-CM | POA: Diagnosis not present

## 2014-07-14 DIAGNOSIS — N200 Calculus of kidney: Secondary | ICD-10-CM | POA: Diagnosis not present

## 2014-07-14 DIAGNOSIS — R1032 Left lower quadrant pain: Secondary | ICD-10-CM | POA: Diagnosis not present

## 2014-07-14 DIAGNOSIS — I2510 Atherosclerotic heart disease of native coronary artery without angina pectoris: Secondary | ICD-10-CM | POA: Diagnosis not present

## 2014-07-14 DIAGNOSIS — E079 Disorder of thyroid, unspecified: Secondary | ICD-10-CM | POA: Diagnosis not present

## 2014-07-14 DIAGNOSIS — R109 Unspecified abdominal pain: Secondary | ICD-10-CM | POA: Diagnosis not present

## 2014-07-14 DIAGNOSIS — M47817 Spondylosis without myelopathy or radiculopathy, lumbosacral region: Secondary | ICD-10-CM | POA: Diagnosis not present

## 2014-07-14 DIAGNOSIS — N201 Calculus of ureter: Secondary | ICD-10-CM | POA: Diagnosis not present

## 2014-07-14 DIAGNOSIS — E7800 Pure hypercholesterolemia, unspecified: Secondary | ICD-10-CM | POA: Diagnosis not present

## 2014-07-14 DIAGNOSIS — I998 Other disorder of circulatory system: Secondary | ICD-10-CM | POA: Diagnosis not present

## 2014-07-14 DIAGNOSIS — Z87442 Personal history of urinary calculi: Secondary | ICD-10-CM | POA: Diagnosis not present

## 2014-07-26 DIAGNOSIS — N200 Calculus of kidney: Secondary | ICD-10-CM | POA: Diagnosis not present

## 2014-07-26 DIAGNOSIS — N201 Calculus of ureter: Secondary | ICD-10-CM | POA: Diagnosis not present

## 2014-08-01 ENCOUNTER — Other Ambulatory Visit: Payer: Self-pay | Admitting: Urology

## 2014-08-01 DIAGNOSIS — N200 Calculus of kidney: Secondary | ICD-10-CM | POA: Diagnosis not present

## 2014-08-01 DIAGNOSIS — N201 Calculus of ureter: Secondary | ICD-10-CM | POA: Diagnosis not present

## 2014-08-02 ENCOUNTER — Encounter (HOSPITAL_COMMUNITY): Payer: Self-pay | Admitting: *Deleted

## 2014-08-03 ENCOUNTER — Encounter (HOSPITAL_COMMUNITY): Payer: Self-pay | Admitting: Pharmacy Technician

## 2014-08-08 ENCOUNTER — Ambulatory Visit (HOSPITAL_COMMUNITY): Payer: Medicare Other

## 2014-08-08 ENCOUNTER — Encounter (HOSPITAL_COMMUNITY): Payer: Self-pay | Admitting: General Practice

## 2014-08-08 ENCOUNTER — Ambulatory Visit (HOSPITAL_COMMUNITY)
Admission: RE | Admit: 2014-08-08 | Discharge: 2014-08-08 | Disposition: A | Discharge status: Home / Self Care | Payer: Medicare Other | Source: Ambulatory Visit | Attending: Urology | Admitting: Urology

## 2014-08-08 ENCOUNTER — Encounter (HOSPITAL_COMMUNITY)
Admission: RE | Disposition: A | Discharge status: Home / Self Care | Payer: Self-pay | Source: Ambulatory Visit | Attending: Urology

## 2014-08-08 DIAGNOSIS — N201 Calculus of ureter: Secondary | ICD-10-CM | POA: Insufficient documentation

## 2014-08-08 DIAGNOSIS — Z7982 Long term (current) use of aspirin: Secondary | ICD-10-CM | POA: Diagnosis not present

## 2014-08-08 DIAGNOSIS — Z79899 Other long term (current) drug therapy: Secondary | ICD-10-CM | POA: Diagnosis not present

## 2014-08-08 DIAGNOSIS — N200 Calculus of kidney: Secondary | ICD-10-CM | POA: Diagnosis not present

## 2014-08-08 LAB — GLUCOSE, CAPILLARY: Glucose-Capillary: 145 mg/dL — ABNORMAL HIGH (ref 70–99)

## 2014-08-08 SURGERY — LITHOTRIPSY, ESWL
Anesthesia: LOCAL | Laterality: Left

## 2014-08-08 MED ORDER — CIPROFLOXACIN HCL 500 MG PO TABS
500.0000 mg | ORAL_TABLET | ORAL | Status: AC
  Administered 2014-08-08: 500 mg via ORAL
  Filled 2014-08-08: qty 1

## 2014-08-08 MED ORDER — DIAZEPAM 5 MG PO TABS
10.0000 mg | ORAL_TABLET | ORAL | Status: AC
  Administered 2014-08-08: 10 mg via ORAL
  Filled 2014-08-08: qty 2

## 2014-08-08 MED ORDER — SODIUM CHLORIDE 0.9 % IV SOLN
INTRAVENOUS | Status: DC
  Administered 2014-08-08: 11:00:00 via INTRAVENOUS

## 2014-08-08 MED ORDER — DIPHENHYDRAMINE HCL 25 MG PO CAPS
25.0000 mg | ORAL_CAPSULE | ORAL | Status: AC
  Administered 2014-08-08: 25 mg via ORAL
  Filled 2014-08-08: qty 1

## 2014-08-08 NOTE — Discharge Instructions (Signed)

## 2014-08-08 NOTE — Op Note (Signed)
Left prox ureter stone, calcification -  Left ESWL - See [facility] scanned op note

## 2014-08-08 NOTE — H&P (Signed)
History of Present Illness                              F/u - referred by Dr. Unice Cobble.       1-nephrolithiasis - Dec 05, 2013 CT A/P report (CT in [facility] by [facility]) a 4 mm right mid-ureteral stone and hydronephrosis. No right renal stones, three left stones, largest 9 mm (seen on scout). He passed a stone a few yrs ago. No surgery. His bun was 20, cr 1.6 (cr 0.23 Apr 2013 on Epic).   -Mar 2015 - rt URS,   -Jul 2015 Left ESWL for LLP stone, staged - pt with back pain       Aug 2015 Interval hx  F/u left ESWL 7/26. Post-op KUB with fragment at L4-L5. A repeat KUB today shows the fragment remains at L4 and has progressed from the proximal ureter on a prior KUB. He hasn't had significant pain. He is staying hydrated. He has passed no other fragments.     Past Medical History Problems  1. History of arthritis (V13.4) 2. History of cardiac disorder (V12.50) 3. History of renal calculi (V13.01)  Surgical History Problems  1. History of Cystoscopy With Ureteroscopy With Removal Of Calculus 2. History of Heart Surgery 3. History of Lithotripsy  Current Meds 1. Aspirin 81 MG Oral Tablet;  Therapy: (Recorded:02Jan2015) to Recorded 2. Flaxseed Oil CAPS;  Therapy: (Recorded:02Jan2015) to Recorded 3. Glimepiride 2 MG Oral Tablet;  Therapy: (Recorded:11Aug2015) to Recorded 4. Levothyroxine Sodium 25 MCG Oral Tablet;  Therapy: ([id number]) to Recorded 5. MetFORMIN HCl - 850 MG Oral Tablet;  Therapy: (Recorded:20Apr2015) to Recorded 6. Metoprolol Tartrate 50 MG Oral Tablet;  Therapy: [phone] to Recorded 7. Oxycodone-Acetaminophen 5-325 MG Oral Tablet; TAKE 1 TO 2 TABLETS EVERY 4 TO 6  HOURS AS NEEDED FOR PAIN;  Therapy: [id number] to (Evaluate:18Jul2015); Last Rx:15Jul2015 Ordered 8. Potassium Citrate ER 15 MEQ (1620 MG) Oral Tablet Extended Release; Take 1 tablet  daily;  Therapy: 11Aug2015 to (Last Rx:11Aug2015) Ordered 9. Ramipril 2.5 MG Oral  Capsule;  Therapy: (Recorded:02Jan2015) to Recorded 10. Simvastatin 20 MG Oral Tablet;   Therapy: (Recorded:02Jan2015) to Recorded  Allergies Medication  1. Codeine Derivatives  Family History Problems  1. Family history of lung cancer (V16.1) : Father  Social History Problems  1. Daily caffeine consumption, 4-5 servings a day 2. Father deceased 3. Four children 4. Married 5. Mother alive 34. Never smoker 7. No alcohol use  Review of Systems Genitourinary, constitutional, skin, eye, otolaryngeal, hematologic/lymphatic, cardiovascular, pulmonary, endocrine, musculoskeletal, gastrointestinal, neurological and psychiatric system(s) were reviewed and pertinent findings if present are noted.    Vitals Vital Signs [Data Includes: Last 1 Day]  Recorded: 17Aug2015 10:57AM  Blood Pressure: 133 / 71 Temperature: 97.6 F Heart Rate: 46  Physical Exam Constitutional: Well nourished and well developed . No acute distress.  Neuro/Psych:. Mood and affect are appropriate.    Results/Data Urine [Data Includes: Last 1 Day]   [id number]  COLOR YELLOW   APPEARANCE CLEAR   SPECIFIC GRAVITY <1.005   pH 5.5   GLUCOSE NEG mg/dL  BILIRUBIN NEG   KETONE NEG mg/dL  BLOOD NEG   PROTEIN NEG mg/dL  UROBILINOGEN 0.2 mg/dL  NITRITE NEG   LEUKOCYTE ESTERASE NEG    KUB today-comparison to prior KUB and CT, findings: The bone and the bowel gas pattern appeared normal. There are multiple left lower pole fragments and a larger  6-7 mm fragment remains at L4 in the proximal ureter.   Assessment Assessed  1. Ureteral stone (592.1)  Plan Health Maintenance  1. UA With REFLEX; [Do Not Release]; Status:Resulted - Requires Verification;   [id number] 10:15AM Ureteral stone  2. Follow-up Schedule Surgery Office  Follow-up  Status: Hold For - Appointment   Requested for: [phone]  Discussion/Summary Left ureteral stone-we discussed the largest fragment has passed into the proximal ureter but  appears stationary. We discussed the nature risk and benefits of continued surveillance, shockwave lithotripsy of the ureteral stone or ureteroscopy. We discussed the advantage here is that shockwave would be noninvasive and it appears the lower pole fragments might pass. We discussed the advantage of ureteroscopy would be we could approach the ureteral stone and the renal stones and clear all the fragments although this may need to be done in a staged fashion with pre-stenting. All questions answered. He elects to proceed with shockwave lithotripsy of the ureteral stone.     Signatures Electronically signed by : Festus Aloe, M.D.; Aug 01 2014 11:56AM EST

## 2014-08-08 NOTE — Interval H&P Note (Signed)
History and Physical Interval Note:  08/08/2014 1:19 PM  Ethan Mcneil  has presented today for surgery, with the diagnosis of LEFT URETERAL STONE  The various methods of treatment have been discussed with the patient and family. After consideration of risks, benefits and other options for treatment, the patient has consented to  Procedure(s): LEFT EXTRACORPOREAL SHOCK WAVE LITHOTRIPSY (ESWL) (Left) as a surgical intervention .  The patient's history has been reviewed, patient examined, no change in status, stable for surgery.  I have reviewed the patient's chart and labs.  Questions were answered to the patient's satisfaction.  He has had no pain or stone passage. No dysuria or fever. KUB - stable fragment in left prox ureter with some small fragments above it. LLP fragment appear well fragmented.    Festus Aloe

## 2014-08-10 ENCOUNTER — Other Ambulatory Visit: Payer: Self-pay | Admitting: Internal Medicine

## 2014-08-17 ENCOUNTER — Ambulatory Visit (INDEPENDENT_AMBULATORY_CARE_PROVIDER_SITE_OTHER): Payer: Medicare Other

## 2014-08-17 DIAGNOSIS — E538 Deficiency of other specified B group vitamins: Secondary | ICD-10-CM

## 2014-08-17 MED ORDER — CYANOCOBALAMIN 1000 MCG/ML IJ SOLN
1000.0000 ug | Freq: Once | INTRAMUSCULAR | Status: AC
  Administered 2014-08-17: 1000 ug via INTRAMUSCULAR

## 2014-08-24 ENCOUNTER — Ambulatory Visit (INDEPENDENT_AMBULATORY_CARE_PROVIDER_SITE_OTHER): Payer: Medicare Other

## 2014-08-24 DIAGNOSIS — E538 Deficiency of other specified B group vitamins: Secondary | ICD-10-CM | POA: Diagnosis not present

## 2014-08-24 MED ORDER — CYANOCOBALAMIN 1000 MCG/ML IJ SOLN
1000.0000 ug | Freq: Once | INTRAMUSCULAR | Status: AC
  Administered 2014-08-24: 1000 ug via INTRAMUSCULAR

## 2014-08-24 MED ORDER — CYANOCOBALAMIN 1000 MCG/ML IJ SOLN: 1000.0000 ug | Freq: Once | INTRAMUSCULAR | Status: DC

## 2014-08-26 DIAGNOSIS — N201 Calculus of ureter: Secondary | ICD-10-CM | POA: Diagnosis not present

## 2014-08-26 DIAGNOSIS — N200 Calculus of kidney: Secondary | ICD-10-CM | POA: Diagnosis not present

## 2014-10-06 ENCOUNTER — Other Ambulatory Visit: Payer: Self-pay

## 2014-10-10 ENCOUNTER — Encounter: Payer: Self-pay | Admitting: Internal Medicine

## 2014-10-10 ENCOUNTER — Ambulatory Visit (INDEPENDENT_AMBULATORY_CARE_PROVIDER_SITE_OTHER): Payer: Medicare Other | Admitting: Internal Medicine

## 2014-10-10 VITALS — BP 118/70 | HR 53 | Temp 98.1°F | Wt 167.2 lb

## 2014-10-10 DIAGNOSIS — I10 Essential (primary) hypertension: Secondary | ICD-10-CM

## 2014-10-10 DIAGNOSIS — Z23 Encounter for immunization: Secondary | ICD-10-CM

## 2014-10-10 DIAGNOSIS — E782 Mixed hyperlipidemia: Secondary | ICD-10-CM

## 2014-10-10 DIAGNOSIS — D649 Anemia, unspecified: Secondary | ICD-10-CM | POA: Diagnosis not present

## 2014-10-10 DIAGNOSIS — I2510 Atherosclerotic heart disease of native coronary artery without angina pectoris: Secondary | ICD-10-CM

## 2014-10-10 DIAGNOSIS — N209 Urinary calculus, unspecified: Secondary | ICD-10-CM | POA: Insufficient documentation

## 2014-10-10 DIAGNOSIS — IMO0002 Reserved for concepts with insufficient information to code with codable children: Secondary | ICD-10-CM

## 2014-10-10 DIAGNOSIS — E1151 Type 2 diabetes mellitus with diabetic peripheral angiopathy without gangrene: Secondary | ICD-10-CM

## 2014-10-10 DIAGNOSIS — E1159 Type 2 diabetes mellitus with other circulatory complications: Secondary | ICD-10-CM

## 2014-10-10 DIAGNOSIS — N218 Other lower urinary tract calculus: Secondary | ICD-10-CM

## 2014-10-10 DIAGNOSIS — E538 Deficiency of other specified B group vitamins: Secondary | ICD-10-CM | POA: Diagnosis not present

## 2014-10-10 DIAGNOSIS — E1165 Type 2 diabetes mellitus with hyperglycemia: Principal | ICD-10-CM

## 2014-10-10 LAB — CBC WITH DIFFERENTIAL/PLATELET
Basophils Absolute: 0.1 10*3/uL (ref 0.0–0.1)
Basophils Relative: 1.1 % (ref 0.0–3.0)
Eosinophils Absolute: 0.4 10*3/uL (ref 0.0–0.7)
Eosinophils Relative: 6.4 % — ABNORMAL HIGH (ref 0.0–5.0)
HCT: 35 % — ABNORMAL LOW (ref 39.0–52.0)
Hemoglobin: 11.5 g/dL — ABNORMAL LOW (ref 13.0–17.0)
Lymphocytes Relative: 31.2 % (ref 12.0–46.0)
Lymphs Abs: 1.8 10*3/uL (ref 0.7–4.0)
MCHC: 32.9 g/dL (ref 30.0–36.0)
MCV: 88.3 fl (ref 78.0–100.0)
Monocytes Absolute: 0.4 10*3/uL (ref 0.1–1.0)
Monocytes Relative: 7.1 % (ref 3.0–12.0)
Neutro Abs: 3.1 10*3/uL (ref 1.4–7.7)
Neutrophils Relative %: 54.2 % (ref 43.0–77.0)
Platelets: 193 10*3/uL (ref 150.0–400.0)
RBC: 3.97 Mil/uL — ABNORMAL LOW (ref 4.22–5.81)
RDW: 15.4 % (ref 11.5–15.5)
WBC: 5.8 10*3/uL (ref 4.0–10.5)

## 2014-10-10 LAB — HEMOGLOBIN A1C: Hgb A1c MFr Bld: 7.7 % — ABNORMAL HIGH (ref 4.6–6.5)

## 2014-10-10 LAB — LIPID PANEL
Cholesterol: 119 mg/dL (ref 0–200)
HDL: 34.7 mg/dL — ABNORMAL LOW (ref >39.00)
LDL Cholesterol: 62 mg/dL (ref 0–99)
NonHDL: 84.3
Total CHOL/HDL Ratio: 3
Triglycerides: 110 mg/dL (ref 0.0–149.0)
VLDL: 22 mg/dL (ref 0.0–40.0)

## 2014-10-10 MED ORDER — CYANOCOBALAMIN 1000 MCG/ML IJ SOLN
1000.0000 ug | Freq: Once | INTRAMUSCULAR | Status: AC
  Administered 2014-10-10: 1000 ug via INTRAMUSCULAR

## 2014-10-10 NOTE — Assessment & Plan Note (Addendum)
Discussed with the patient the fact that diabetes is not well controlled, A1c goal  less than 7 given his history of CAD. He is quite adverse/worried about the possibility of low blood sugars. Currently taking only one metformin and 1/2  Amaryl. Plan: Stop amaryl, increase metformin to bid , check A1c. Explained that metformin by itself will not cause hypoglycemia Return to the office 3 months, add tradjenta ?

## 2014-10-10 NOTE — Assessment & Plan Note (Signed)
Continue with simvastatin, check FLP

## 2014-10-10 NOTE — Patient Instructions (Signed)
Get your blood work before you leave   Stop glimepiride Increase metformin to twice a day Take a walk daily   Please come back to the office 3 months  for a routine check up  , no fasting

## 2014-10-10 NOTE — Assessment & Plan Note (Addendum)
No iron def  per recent labs,  updated on his colonoscopies. Plan: Continue B12 supplements and check a CBC

## 2014-10-10 NOTE — Progress Notes (Signed)
Subjective:    Patient ID: Ethan Mcneil, male    DOB: 01/30/1934, 80 y.o.   MRN: [id number]  DOS:  10/10/2014 Type of visit - description :  Interval history: Diabetes, CBGs ranged from 150-175, see assessment and plan. Hypertension, good medication compliance without apparent side effects High cholesterol, on simvastatin, due for labs Complaint of dizziness  , mostly when he stands up fast or bend over. Thinks symptoms related to taking Flonase. B-12 deficiency, due for injection.    ROS Denies chest pain or difficulty breathing. No nausea, vomiting, diarrhea No diplopia, slurred speech or motor deficits   Past Medical History  Diagnosis Date  . PVD (peripheral vascular disease)   . Unspecified hyperplasia of prostate without urinary obstruction and other lower urinary tract symptoms (LUTS)   . DM2 (diabetes mellitus, type 2)   . HTN (hypertension)     essential nos  . HLD (hyperlipidemia)   . CAD (coronary artery disease)     CABG 1997; grafts patent @ cath [facility] 11/09  . S/P herniorrhaphy   . Postsurgical aortocoronary bypass status   . Arthritis   . Tick fever 2012    [facility] [place]  . Gout     after tick bite  . Kidney stones     kidney stones  . Hypothyroid     Past Surgical History  Procedure Laterality Date  . Coronary artery bypass graft      x7 vessels 1997; cath 2002 and 2009; grafts patent   . Shoulder surgery    . Colonoscopy  2012    polyps   . Inguinal hernia repair    . Cystoscopy with retrograde pyelogram, ureteroscopy and stent placement Right 02/21/2014    Procedure: CYSTOSCOPY WITH RIGHT  RETROGRADE PYELOGRAM,RIGHT  URETEROSCOPY WITH STONE BASKETING EXTRACTION;  Surgeon: Irine Seal, MD;  Location: [facility];  Service: Urology;  Laterality: Right;    History   Social History  . Marital Status: Married    Spouse Name: N/A    Number of Children: 4  . Years of Education: N/A   Occupational History  . retired, works few  hours     Social History Main Topics  . Smoking status: Never Smoker   . Smokeless tobacco: Never Used  . Alcohol Use: No  . Drug Use: No  . Sexual Activity: Not on file   Other Topics Concern  . Not on file   Social History Narrative   Buyer, retail and former race car driver   4 children (boys)   Designated party form signed appointing wife - [name]; ok to leave msg on home phone [phone]                 Medication List       This list is accurate as of: 10/10/14 11:59 PM.  Always use your most recent med list.               aspirin EC 81 MG tablet  Take 81 mg by mouth daily.     cyanocobalamin 1000 MCG/ML injection  Commonly known as:  (VITAMIN B-12)  Inject 1 mL (1,000 mcg total) into the muscle once.     FLAX OIL XTRA Caps  Take 1 capsule by mouth daily.     ibuprofen 200 MG tablet  Commonly known as:  ADVIL,MOTRIN  Take 400 mg by mouth every 6 (six) hours as needed (Pain).     levothyroxine 25 MCG tablet  Commonly known as:  SYNTHROID, LEVOTHROID  Take 25 mcg by mouth daily before breakfast.     metFORMIN 850 MG tablet  Commonly known as:  GLUCOPHAGE  Take 850 mg by mouth 2 (two) times daily with a meal.     metoprolol 50 MG tablet  Commonly known as:  LOPRESSOR  Take 25 mg by mouth 2 (two) times daily.     oxyCODONE-acetaminophen 5-325 MG per tablet  Commonly known as:  PERCOCET/ROXICET  Take 1-2 tablets by mouth every 6 (six) hours as needed for severe pain (kidney stones).     ramipril 2.5 MG capsule  Commonly known as:  ALTACE  TAKE 1 CAPSULE BY MOUTH DAILY     simvastatin 20 MG tablet  Commonly known as:  ZOCOR  Take 20 mg by mouth at bedtime.     tamsulosin 0.4 MG Caps capsule  Commonly known as:  FLOMAX  Take 1 capsule (0.4 mg total) by mouth daily after supper.           Objective:   Physical Exam BP 118/70  Pulse 53  Temp(Src) 98.1 F (36.7 C) (Oral)  Wt 167 lb 4 oz (75.864 kg)  SpO2 98%  General --  alert, well-developed, NAD.  HEENT-- Not pale.   Lungs -- normal respiratory effort, no intercostal retractions, no accessory muscle use, and normal breath sounds.  Heart-- normal rate, regular rhythm, no murmur.  Extremities-- L ;leg edema @ baseline  Neurologic--  alert & oriented X3. Speech normal, gait appropriate for age, strength symmetric and appropriate for age.  Psych-- Cognition and judgment appear intact. Cooperative with normal attention span and concentration. No anxious or depressed appearing.       Assessment & Plan:

## 2014-10-10 NOTE — Progress Notes (Signed)
Pre visit review using our clinic review tool, if applicable. No additional management support is needed unless otherwise documented below in the visit note. 

## 2014-10-10 NOTE — Assessment & Plan Note (Signed)
Due for a  injection today

## 2014-10-10 NOTE — Assessment & Plan Note (Signed)
Long history of urolithiasis, status post lithotripsy ~ 07-2014. Since then is taking flomax and has developed  dizziness, since he started the medication. No red flag symptoms associated with dizziness, neurologically he seems at baseline Recommend to discuss urology the need to continue taking Flomax

## 2014-10-17 ENCOUNTER — Other Ambulatory Visit: Payer: Self-pay | Admitting: Internal Medicine

## 2014-10-17 ENCOUNTER — Other Ambulatory Visit: Payer: Self-pay

## 2014-11-07 ENCOUNTER — Other Ambulatory Visit: Payer: Self-pay | Admitting: Internal Medicine

## 2014-11-23 ENCOUNTER — Other Ambulatory Visit: Payer: Self-pay | Admitting: Internal Medicine

## 2014-12-26 ENCOUNTER — Other Ambulatory Visit: Payer: Self-pay | Admitting: Internal Medicine

## 2014-12-26 DIAGNOSIS — N201 Calculus of ureter: Secondary | ICD-10-CM | POA: Diagnosis not present

## 2015-01-09 ENCOUNTER — Other Ambulatory Visit: Payer: Self-pay

## 2015-01-10 ENCOUNTER — Ambulatory Visit: Payer: Medicare Other | Admitting: Internal Medicine

## 2015-01-13 ENCOUNTER — Ambulatory Visit (INDEPENDENT_AMBULATORY_CARE_PROVIDER_SITE_OTHER): Payer: Medicare Other | Admitting: Internal Medicine

## 2015-01-13 ENCOUNTER — Encounter: Payer: Self-pay | Admitting: Internal Medicine

## 2015-01-13 VITALS — BP 123/68 | HR 55 | Temp 98.1°F | Ht 70.0 in | Wt 155.5 lb

## 2015-01-13 DIAGNOSIS — E1165 Type 2 diabetes mellitus with hyperglycemia: Principal | ICD-10-CM

## 2015-01-13 DIAGNOSIS — E038 Other specified hypothyroidism: Secondary | ICD-10-CM

## 2015-01-13 DIAGNOSIS — E1151 Type 2 diabetes mellitus with diabetic peripheral angiopathy without gangrene: Secondary | ICD-10-CM

## 2015-01-13 DIAGNOSIS — R634 Abnormal weight loss: Secondary | ICD-10-CM

## 2015-01-13 DIAGNOSIS — E1159 Type 2 diabetes mellitus with other circulatory complications: Secondary | ICD-10-CM | POA: Diagnosis not present

## 2015-01-13 DIAGNOSIS — E538 Deficiency of other specified B group vitamins: Secondary | ICD-10-CM | POA: Diagnosis not present

## 2015-01-13 DIAGNOSIS — IMO0002 Reserved for concepts with insufficient information to code with codable children: Secondary | ICD-10-CM

## 2015-01-13 LAB — CBC WITH DIFFERENTIAL/PLATELET
Basophils Absolute: 0 10*3/uL (ref 0.0–0.1)
Basophils Relative: 0.7 % (ref 0.0–3.0)
Eosinophils Absolute: 0.3 10*3/uL (ref 0.0–0.7)
Eosinophils Relative: 4.7 % (ref 0.0–5.0)
HCT: 35.3 % — ABNORMAL LOW (ref 39.0–52.0)
Hemoglobin: 11.9 g/dL — ABNORMAL LOW (ref 13.0–17.0)
Lymphocytes Relative: 30.7 % (ref 12.0–46.0)
Lymphs Abs: 2.1 10*3/uL (ref 0.7–4.0)
MCHC: 33.7 g/dL (ref 30.0–36.0)
MCV: 86.7 fl (ref 78.0–100.0)
Monocytes Absolute: 0.5 10*3/uL (ref 0.1–1.0)
Monocytes Relative: 6.9 % (ref 3.0–12.0)
Neutro Abs: 3.8 10*3/uL (ref 1.4–7.7)
Neutrophils Relative %: 57 % (ref 43.0–77.0)
Platelets: 233 10*3/uL (ref 150.0–400.0)
RBC: 4.07 Mil/uL — ABNORMAL LOW (ref 4.22–5.81)
RDW: 14.3 % (ref 11.5–15.5)
WBC: 6.7 10*3/uL (ref 4.0–10.5)

## 2015-01-13 LAB — COMPREHENSIVE METABOLIC PANEL
ALT: 16 U/L (ref 0–53)
AST: 14 U/L (ref 0–37)
Albumin: 3.4 g/dL — ABNORMAL LOW (ref 3.5–5.2)
Alkaline Phosphatase: 47 U/L (ref 39–117)
BUN: 17 mg/dL (ref 6–23)
CO2: 29 mEq/L (ref 19–32)
Calcium: 8.6 mg/dL (ref 8.4–10.5)
Chloride: 100 mEq/L (ref 96–112)
Creatinine, Ser: 0.97 mg/dL (ref 0.40–1.50)
GFR: 79.46 mL/min (ref >60.00)
Glucose, Bld: 248 mg/dL — ABNORMAL HIGH (ref 70–99)
Potassium: 4.4 mEq/L (ref 3.5–5.1)
Sodium: 133 mEq/L — ABNORMAL LOW (ref 135–145)
Total Bilirubin: 0.5 mg/dL (ref 0.2–1.2)
Total Protein: 5.5 g/dL — ABNORMAL LOW (ref 6.0–8.3)

## 2015-01-13 LAB — VITAMIN D 25 HYDROXY (VIT D DEFICIENCY, FRACTURES): VITD: 21.76 ng/mL — ABNORMAL LOW (ref 30.00–100.00)

## 2015-01-13 LAB — TSH: TSH: 4.86 u[IU]/mL — ABNORMAL HIGH (ref 0.35–4.50)

## 2015-01-13 LAB — HEMOGLOBIN A1C: Hgb A1c MFr Bld: 12.2 % — ABNORMAL HIGH (ref 4.6–6.5)

## 2015-01-13 LAB — FOLATE: Folate: >24.6 ng/mL (ref >5.9)

## 2015-01-13 MED ORDER — GLUCOSE BLOOD VI STRP: ORAL_STRIP | Status: DC

## 2015-01-13 MED ORDER — CYANOCOBALAMIN 1000 MCG/ML IJ SOLN
1000.0000 ug | Freq: Once | INTRAMUSCULAR | Status: AC
  Administered 2015-01-13: 1000 ug via INTRAMUSCULAR

## 2015-01-13 MED ORDER — CYANOCOBALAMIN 1000 MCG/ML IJ SOLN: 1000.0000 ug | INTRAMUSCULAR | Status: DC

## 2015-01-13 MED ORDER — "NEEDLE (DISP) 25G X 5/8"" MISC": Status: DC

## 2015-01-13 NOTE — Assessment & Plan Note (Signed)
Good compliance with Synthroid, check a TSH

## 2015-01-13 NOTE — Assessment & Plan Note (Addendum)
Poor compliance with to 12 shots due to the  inconvenience of coming to the clinic. Will prescribe B12 im solution as he has a family member that can provide shots

## 2015-01-13 NOTE — Patient Instructions (Signed)
Get your blood work before you leave   Start B12 shots monthly  Diabetes: Check your blood sugar  once a day   3-4 times a week  Check your blood sugar  at different times of the day  GOALS: Fasting before a meal 70- 130 2 hours after a meal less than 180 At bedtime 90-150 Call if consistently not at goal   Please come back to the office in 3 months for a routine check up

## 2015-01-13 NOTE — Progress Notes (Signed)
Subjective:    Patient ID: Ethan Mcneil, male    DOB: 01-09-1934, 81 y.o.   MRN: [id number]  DOS:  01/13/2015 Type of visit - description : rov Interval history: Diabetes, good compliance with medication, no recent ambulatory CBGs. B12 deficiency, has not taken shots in a while Hypothyroidism, good compliance medication. Also, complaining of decreased appetite, decreased energy and weight loss. Denies any postprandial abdominal pain or nausea, "nothing tastes good".   ROS  Denies fever or chills, no unusual headaches or aches and pains. Left lower extremity edema at baseline No nausea, vomiting, diarrhea, blood in the stools or abdominal pain No cough or sputum production. No hemoptysis. Denies any depression or anxiety. No dysuria, gross hematuria difficulty urinating  Past Medical History  Diagnosis Date  . PVD (peripheral vascular disease)   . Unspecified hyperplasia of prostate without urinary obstruction and other lower urinary tract symptoms (LUTS)   . DM2 (diabetes mellitus, type 2)   . HTN (hypertension)     essential nos  . HLD (hyperlipidemia)   . CAD (coronary artery disease)     CABG 1997; grafts patent @ cath [facility] 11/09  . S/P herniorrhaphy   . Postsurgical aortocoronary bypass status   . Arthritis   . Tick fever 2012    [facility] , [place]  . Gout     after tick bite  . Kidney stones     kidney stones  . Hypothyroid     Past Surgical History  Procedure Laterality Date  . Coronary artery bypass graft      x7 vessels 1997; cath 2002 and 2009; grafts patent   . Shoulder surgery    . Colonoscopy  2012    polyps   . Inguinal hernia repair    . Cystoscopy with retrograde pyelogram, ureteroscopy and stent placement Right 02/21/2014    Procedure: CYSTOSCOPY WITH RIGHT  RETROGRADE PYELOGRAM,RIGHT  URETEROSCOPY WITH STONE BASKETING EXTRACTION;  Surgeon: Irine Seal, MD;  Location: [facility];  Service: Urology;  Laterality: Right;    History    Social History  . Marital Status: Married    Spouse Name: N/A    Number of Children: 4  . Years of Education: N/A   Occupational History  . retired, works few hours     Social History Main Topics  . Smoking status: Never Smoker   . Smokeless tobacco: Never Used  . Alcohol Use: No  . Drug Use: No  . Sexual Activity: Not on file   Other Topics Concern  . 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  Always use your most recent med list.               aspirin EC 81 MG tablet  Take 81 mg by mouth daily.     cyanocobalamin 1000 MCG/ML injection  Commonly known as:  (VITAMIN B-12)  Inject 1 mL (1,000 mcg total) into the muscle every 30 (thirty) days.     FLAX OIL XTRA Caps  Take 1 capsule by mouth daily.     glucose blood test strip  Check blood  sugar once daily     ibuprofen 200 MG tablet  Commonly known as:  ADVIL,MOTRIN  Take 400 mg by mouth every 6 (six) hours as needed (Pain).     levothyroxine 25 MCG tablet  Commonly known as:  SYNTHROID, LEVOTHROID  TAKE 1 TABLET BY MOUTH DAILY     metFORMIN 850 MG tablet  Commonly known as:  GLUCOPHAGE  TAKE 1 TABLET (850 MG TOTAL) BY MOUTH 2 (TWO) TIMES DAILY WITH A MEAL.     metoprolol 50 MG tablet  Commonly known as:  LOPRESSOR  Take 25 mg by mouth 2 (two) times daily.     NEEDLE (DISP) 25 G 25G X 5/8" Misc  Commonly known as:  BD DISP NEEDLES  To be used once while injecting medication monthly.     oxyCODONE-acetaminophen 5-325 MG per tablet  Commonly known as:  PERCOCET/ROXICET  Take 1-2 tablets by mouth every 6 (six) hours as needed for severe pain (kidney stones).     ramipril 2.5 MG capsule  Commonly known as:  ALTACE  TAKE 1 CAPSULE BY MOUTH  DAILY     simvastatin 20 MG tablet  Commonly known as:  ZOCOR  Take 20 mg by mouth at bedtime.           Objective:   Physical Exam BP 123/68 mmHg  Pulse 55  Temp(Src) 98.1 F (36.7 C) (Oral)  Ht 5\' 10"  (1.778 m)  Wt 155 lb 8 oz (70.534 kg)  BMI 22.31 kg/m2  SpO2 98% General -- alert, well-developed, NAD. No pale, jaundice Neck --no thyromegaly , normal carotid pulse,   Lungs -- normal respiratory effort, no intercostal retractions, no accessory muscle use, and normal breath sounds.  Heart-- normal rate, regular rhythm, no murmur.  Abdomen-- Not distended, good bowel sounds,soft, non-tender. No bruit, mass  Extremities-- mild L  Edema  Neurologic--  alert & oriented X3. Speech normal, gait appropriate for age, strength symmetric and appropriate for age.  Psych-- Cognition and judgment appear intact. Cooperative with normal attention span and concentration. No anxious or depressed appearing.       Assessment & Plan:

## 2015-01-13 NOTE — Assessment & Plan Note (Addendum)
Presents with weight loss, has gradually lost 30 pounds in the last 2 years. No evidence of depression, PMR or  GI angina on clinical grounds Plan: General labs today, encourage monthly B12 shots, reassess in 3 months

## 2015-01-13 NOTE — Assessment & Plan Note (Signed)
As recommended, he stop Amaryl and is on metformin only, not ambulatory CBGs. Plan: Labs, provided prescription for glucometer strips, see instructions

## 2015-01-13 NOTE — Progress Notes (Signed)
Pre visit review using our clinic review tool, if applicable. No additional management support is needed unless otherwise documented below in the visit note. 

## 2015-01-13 NOTE — Assessment & Plan Note (Signed)
Well-controlled, continue Altace, Lopressor. Check a CMP

## 2015-01-17 ENCOUNTER — Telehealth: Payer: Self-pay | Admitting: Internal Medicine

## 2015-01-17 MED ORDER — GLIMEPIRIDE 2 MG PO TABS: ORAL_TABLET | ORAL | Status: DC

## 2015-01-17 MED ORDER — VITAMIN D (ERGOCALCIFEROL) 1.25 MG (50000 UNIT) PO CAPS: 50000.0000 [IU] | ORAL_CAPSULE | ORAL | Status: DC

## 2015-01-17 MED ORDER — LEVOTHYROXINE SODIUM 50 MCG PO TABS: 50.0000 ug | ORAL_TABLET | Freq: Every day | ORAL | Status: DC

## 2015-01-17 MED ORDER — SAXAGLIPTIN HCL 5 MG PO TABS: 5.0000 mg | ORAL_TABLET | Freq: Every day | ORAL | Status: DC

## 2015-01-17 NOTE — Telephone Encounter (Signed)
Tried returning Pts call, spoke with Mardene Celeste, [place] wife, informed her was returning his call about his lab results. Mardene Celeste informed me he was not home, that his mother was placed in the hospital yesterday but that she would give my number to him to return call.

## 2015-01-17 NOTE — Telephone Encounter (Signed)
Pt returning your call, states to please call him back at the shop at [phone]

## 2015-01-17 NOTE — Telephone Encounter (Signed)
Spoke with Pt regarding lab results, see lab result notes.

## 2015-01-17 NOTE — Addendum Note (Signed)
Addended by: Wilfrid Lund on: 01/17/2015 04:16 PM   Modules accepted: Orders, Medications

## 2015-01-17 NOTE — Telephone Encounter (Signed)
Caller name: Holger, Arlinghaus Relation to pt: self  Call back number: best # work [phone] Pharmacy:  Reason for call:  Pt returning your call regarding lab results

## 2015-01-24 ENCOUNTER — Other Ambulatory Visit: Payer: Self-pay

## 2015-01-24 ENCOUNTER — Other Ambulatory Visit: Payer: Self-pay | Admitting: Internal Medicine

## 2015-01-25 ENCOUNTER — Telehealth: Payer: Self-pay | Admitting: Internal Medicine

## 2015-01-25 NOTE — Telephone Encounter (Signed)
Caller name: Trice, Schmock Relation to pt: self  Call back number: [phone] Pharmacy: [id number] - [place], [place]. [phone] (Phone) [phone] (Fax)         Reason for call:   Pt requesting glucose blood test strip for freestyle monitor, pt was unsure of what type of monitor he uses.

## 2015-01-25 NOTE — Telephone Encounter (Signed)
Pts glucose test strips were [facility] to [facility] on 01/13/2015 # 100 and 12 refills for his Freestyle device, he will need to go by the pharmacy to get them.

## 2015-01-28 ENCOUNTER — Other Ambulatory Visit: Payer: Self-pay | Admitting: Internal Medicine

## 2015-01-31 ENCOUNTER — Other Ambulatory Visit: Payer: Self-pay | Admitting: Internal Medicine

## 2015-02-16 DIAGNOSIS — H2513 Age-related nuclear cataract, bilateral: Secondary | ICD-10-CM | POA: Diagnosis not present

## 2015-02-16 DIAGNOSIS — E119 Type 2 diabetes mellitus without complications: Secondary | ICD-10-CM | POA: Diagnosis not present

## 2015-03-22 ENCOUNTER — Other Ambulatory Visit: Payer: Self-pay

## 2015-04-07 ENCOUNTER — Other Ambulatory Visit: Payer: Self-pay | Admitting: Internal Medicine

## 2015-04-07 NOTE — Telephone Encounter (Signed)
Pt has F/U appt scheduled for 04/14/2015 at 0930.

## 2015-04-07 NOTE — Telephone Encounter (Signed)
Pt has been through 12 week Ergocalciferol course, would you like for him to continue? Or return for labs?

## 2015-04-07 NOTE — Telephone Encounter (Signed)
No need for more ergocalciferol, needs to take OTC vitamin D approximately 1000 units daily. He is due for a routine office visit this month, please be sure he has an appointment

## 2015-04-14 ENCOUNTER — Encounter: Payer: Self-pay | Admitting: Internal Medicine

## 2015-04-14 ENCOUNTER — Ambulatory Visit (INDEPENDENT_AMBULATORY_CARE_PROVIDER_SITE_OTHER): Payer: Medicare Other | Admitting: Internal Medicine

## 2015-04-14 VITALS — BP 122/58 | HR 56 | Temp 97.8°F | Ht 70.0 in | Wt 153.4 lb

## 2015-04-14 DIAGNOSIS — R634 Abnormal weight loss: Secondary | ICD-10-CM | POA: Diagnosis not present

## 2015-04-14 DIAGNOSIS — E039 Hypothyroidism, unspecified: Secondary | ICD-10-CM | POA: Diagnosis not present

## 2015-04-14 DIAGNOSIS — E119 Type 2 diabetes mellitus without complications: Secondary | ICD-10-CM

## 2015-04-14 DIAGNOSIS — D649 Anemia, unspecified: Secondary | ICD-10-CM | POA: Diagnosis not present

## 2015-04-14 DIAGNOSIS — E038 Other specified hypothyroidism: Secondary | ICD-10-CM

## 2015-04-14 DIAGNOSIS — E559 Vitamin D deficiency, unspecified: Secondary | ICD-10-CM

## 2015-04-14 DIAGNOSIS — E538 Deficiency of other specified B group vitamins: Secondary | ICD-10-CM

## 2015-04-14 LAB — CBC WITH DIFFERENTIAL/PLATELET
Basophils Absolute: 0 10*3/uL (ref 0.0–0.1)
Basophils Relative: 0.7 % (ref 0.0–3.0)
Eosinophils Absolute: 0.3 10*3/uL (ref 0.0–0.7)
Eosinophils Relative: 4.7 % (ref 0.0–5.0)
HCT: 33.5 % — ABNORMAL LOW (ref 39.0–52.0)
Hemoglobin: 11.2 g/dL — ABNORMAL LOW (ref 13.0–17.0)
Lymphocytes Relative: 26.6 % (ref 12.0–46.0)
Lymphs Abs: 1.8 10*3/uL (ref 0.7–4.0)
MCHC: 33.5 g/dL (ref 30.0–36.0)
MCV: 89.3 fl (ref 78.0–100.0)
Monocytes Absolute: 0.4 10*3/uL (ref 0.1–1.0)
Monocytes Relative: 6.2 % (ref 3.0–12.0)
Neutro Abs: 4.1 10*3/uL (ref 1.4–7.7)
Neutrophils Relative %: 61.8 % (ref 43.0–77.0)
Platelets: 232 10*3/uL (ref 150.0–400.0)
RBC: 3.76 Mil/uL — ABNORMAL LOW (ref 4.22–5.81)
RDW: 15.4 % (ref 11.5–15.5)
WBC: 6.7 10*3/uL (ref 4.0–10.5)

## 2015-04-14 LAB — HEMOGLOBIN A1C: Hgb A1c MFr Bld: 7.7 % — ABNORMAL HIGH (ref 4.6–6.5)

## 2015-04-14 LAB — BASIC METABOLIC PANEL
BUN: 19 mg/dL (ref 6–23)
CO2: 27 mEq/L (ref 19–32)
Calcium: 8.5 mg/dL (ref 8.4–10.5)
Chloride: 104 mEq/L (ref 96–112)
Creatinine, Ser: 1.01 mg/dL (ref 0.40–1.50)
GFR: 75.8 mL/min (ref >60.00)
Glucose, Bld: 106 mg/dL — ABNORMAL HIGH (ref 70–99)
Potassium: 4.4 mEq/L (ref 3.5–5.1)
Sodium: 136 mEq/L (ref 135–145)

## 2015-04-14 LAB — TSH: TSH: 3.92 u[IU]/mL (ref 0.35–4.50)

## 2015-04-14 LAB — ALT: ALT: 12 U/L (ref 0–53)

## 2015-04-14 LAB — AST: AST: 12 U/L (ref 0–37)

## 2015-04-14 MED ORDER — SAXAGLIPTIN HCL 5 MG PO TABS: 5.0000 mg | ORAL_TABLET | Freq: Every day | ORAL | Status: DC

## 2015-04-14 MED ORDER — LEVOTHYROXINE SODIUM 50 MCG PO TABS: 50.0000 ug | ORAL_TABLET | Freq: Every day | ORAL | Status: DC

## 2015-04-14 MED ORDER — METFORMIN HCL 850 MG PO TABS: 850.0000 mg | ORAL_TABLET | Freq: Two times a day (BID) | ORAL | Status: DC

## 2015-04-14 MED ORDER — RAMIPRIL 2.5 MG PO CAPS: 2.5000 mg | ORAL_CAPSULE | Freq: Every day | ORAL | Status: DC

## 2015-04-14 MED ORDER — GLUCOSE BLOOD VI STRP: ORAL_STRIP | Status: DC

## 2015-04-14 MED ORDER — GLIMEPIRIDE 2 MG PO TABS: 2.0000 mg | ORAL_TABLET | Freq: Every day | ORAL | Status: DC

## 2015-04-14 MED ORDER — KETOCONAZOLE 2 % EX CREA: 1.0000 "application " | TOPICAL_CREAM | Freq: Every day | CUTANEOUS | Status: DC

## 2015-04-14 MED ORDER — SIMVASTATIN 20 MG PO TABS: 20.0000 mg | ORAL_TABLET | Freq: Every day | ORAL | Status: DC

## 2015-04-14 NOTE — Progress Notes (Signed)
Pre visit review using our clinic review tool, if applicable. No additional management support is needed unless otherwise documented below in the visit note. 

## 2015-04-14 NOTE — Assessment & Plan Note (Addendum)
S/p ergocalciferol, not taking any supplements, recommend OTC vitamin D 1000 units daily

## 2015-04-14 NOTE — Assessment & Plan Note (Signed)
Has decrease 3 pounds since the last visit, the last time we weighed him  was winter, modest different in weight  may be related to clothing. Observation for now

## 2015-04-14 NOTE — Assessment & Plan Note (Addendum)
Last hemoglobin was low, now on B12 supplements, check labs

## 2015-04-14 NOTE — Assessment & Plan Note (Signed)
Reports good compliance with B12 shot

## 2015-04-14 NOTE — Progress Notes (Signed)
Subjective:    Patient ID: Ethan Mcneil, male    DOB: Apr 02, 1934, 81 y.o.   MRN: [id number]  DOS:  04/14/2015 Type of visit - description : rov Interval history: Low vitamin D, status post ergocalciferol, not taking  OTCs Diabetes, last A1c quite elevated, a number of medications were prescribed, good compliance, not ambulatory CBGs B12 deficiency, now self injecting monthly. Weight loss, has lost 3 pounds per our scales, does not self weight, appetite is noted to be better Also complained of a rash on and off at the growing, better with OTC hydrocortisone, moderately itching.   Review of Systems Denies chest pain, difficulty breathing, no lower extremity edema No nausea, vomiting, diarrhea. No symptoms consistent with low blood sugars  Past Medical History  Diagnosis Date  . PVD (peripheral vascular disease)   . Unspecified hyperplasia of prostate without urinary obstruction and other lower urinary tract symptoms (LUTS)   . DM2 (diabetes mellitus, type 2)   . HTN (hypertension)     essential nos  . HLD (hyperlipidemia)   . CAD (coronary artery disease)     CABG 1997; grafts patent @ cath [facility] 11/09  . S/P herniorrhaphy   . Postsurgical aortocoronary bypass status   . Arthritis   . Tick fever 2012    [facility] , [place]  . Gout     after tick bite  . Kidney stones     kidney stones  . Hypothyroid     Past Surgical History  Procedure Laterality Date  . Coronary artery bypass graft      x7 vessels 1997; cath 2002 and 2009; grafts patent   . Shoulder surgery    . Colonoscopy  2012    polyps   . Inguinal hernia repair    . Cystoscopy with retrograde pyelogram, ureteroscopy and stent placement Right 02/21/2014    Procedure: CYSTOSCOPY WITH RIGHT  RETROGRADE PYELOGRAM,RIGHT  URETEROSCOPY WITH STONE BASKETING EXTRACTION;  Surgeon: Irine Seal, MD;  Location: [facility];  Service: Urology;  Laterality: Right;    History   Social History  . Marital Status:  Married    Spouse Name: N/A  . Number of Children: 4  . Years of Education: N/A   Occupational History  . retired, works few hours     Social History Main Topics  . Smoking status: Never Smoker   . Smokeless tobacco: Never Used  . Alcohol Use: No  . Drug Use: No  . Sexual Activity: Not on file   Other Topics Concern  . 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  Always use your most recent med list.               aspirin EC 81 MG tablet  Take 81 mg by mouth daily.     cyanocobalamin 1000 MCG/ML injection  Commonly known as:  (VITAMIN B-12)  Inject 1 mL (1,000 mcg total) into the muscle every 30 (thirty) days.     FLAX OIL XTRA Caps  Take 1 capsule by mouth daily.     glimepiride 2 MG tablet  Commonly known  as:  AMARYL  Take 1 tablet (2 mg total) by mouth daily with breakfast.     glucose blood test strip  Commonly known as:  FREESTYLE TEST STRIPS  Check blood sugar no more than twice daily.     ibuprofen 200 MG tablet  Commonly known as:  ADVIL,MOTRIN  Take 400 mg by mouth every 6 (six) hours as needed (Pain).     ketoconazole 2 % cream  Commonly known as:  NIZORAL  Apply 1 application topically daily.     levothyroxine 50 MCG tablet  Commonly known as:  SYNTHROID, LEVOTHROID  Take 1 tablet (50 mcg total) by mouth daily.     metFORMIN 850 MG tablet  Commonly known as:  GLUCOPHAGE  Take 1 tablet (850 mg total) by mouth 2 (two) times daily with a meal.     metoprolol succinate 50 MG 24 hr tablet  Commonly known as:  TOPROL-XL  TAKE 1/2 TABLET BY MOUTH TWICE DAILY     NEEDLE (DISP) 25 G 25G X 5/8" Misc  Commonly known as:  BD DISP NEEDLES  To be used once while injecting medication monthly.       oxyCODONE-acetaminophen 5-325 MG per tablet  Commonly known as:  PERCOCET/ROXICET  Take 1-2 tablets by mouth every 6 (six) hours as needed for severe pain (kidney stones).     ramipril 2.5 MG capsule  Commonly known as:  ALTACE  Take 1 capsule (2.5 mg total) by mouth daily.     saxagliptin HCl 5 MG Tabs tablet  Commonly known as:  ONGLYZA  Take 1 tablet (5 mg total) by mouth daily.     simvastatin 20 MG tablet  Commonly known as:  ZOCOR  Take 1 tablet (20 mg total) by mouth at bedtime.           Objective:   Physical Exam  Skin:      BP 122/58 mmHg  Pulse 56  Temp(Src) 97.8 F (36.6 C) (Oral)  Ht 5\' 10"  (1.778 m)  Wt 153 lb 6 oz (69.57 kg)  BMI 22.01 kg/m2  SpO2 96% General:   Well developed, well nourished . NAD.  HEENT:  Normocephalic . Face symmetric, atraumatic; no thyromegaly Lungs:  CTA B Normal respiratory effort, no intercostal retractions, no accessory muscle use. Heart: RRR,  no murmur.  Muscle skeletal: no pretibial edema bilaterally  Skin: see graphic  Neurologic:  alert & oriented X3.  Speech normal, gait appropriate for age and unassisted Psych--  Cognition and judgment appear intact.  Cooperative with normal attention span and concentration.  Behavior appropriate. No anxious or depressed appearing.        Assessment & Plan:   Rash, likely fungal, prescribe ketoconazole

## 2015-04-14 NOTE — Patient Instructions (Signed)
Get your blood work before you leave   Take OTC vitamin D 1000 units daily  Rash: Ketoconazole twice a day for at least 10 days, also okay to put OTC hydrocortisone as needed  Diabetes: Check your blood sugar    3-4 times a week  Check your blood sugar  at different times of the day  GOALS: Fasting before a meal 70- 130 2 hours after a meal less than 180 At bedtime 90-150 Call if consistently not at goal   Come back to the office in 3 months   for a routine check up

## 2015-04-14 NOTE — Assessment & Plan Note (Signed)
Last TSH was above 4, Synthroid dose adjusted, labs

## 2015-04-14 NOTE — Assessment & Plan Note (Signed)
Last A1c 12.2, we started Onglyza, restarted glimepiride. He is also on metformin. Good compliance, not ambulatory CBGs, no symptoms consistent with low blood sugars. Plan: Check A1c, BMP, LFTs

## 2015-06-27 ENCOUNTER — Encounter: Payer: Self-pay | Admitting: Internal Medicine

## 2015-07-07 ENCOUNTER — Encounter: Payer: Self-pay | Admitting: Internal Medicine

## 2015-07-07 ENCOUNTER — Ambulatory Visit (INDEPENDENT_AMBULATORY_CARE_PROVIDER_SITE_OTHER): Payer: Medicare Other | Admitting: Internal Medicine

## 2015-07-07 VITALS — BP 128/74 | HR 41 | Temp 97.5°F | Ht 70.0 in | Wt 153.2 lb

## 2015-07-07 DIAGNOSIS — I10 Essential (primary) hypertension: Secondary | ICD-10-CM | POA: Diagnosis not present

## 2015-07-07 DIAGNOSIS — E1151 Type 2 diabetes mellitus with diabetic peripheral angiopathy without gangrene: Secondary | ICD-10-CM

## 2015-07-07 DIAGNOSIS — E559 Vitamin D deficiency, unspecified: Secondary | ICD-10-CM | POA: Diagnosis not present

## 2015-07-07 DIAGNOSIS — E1159 Type 2 diabetes mellitus with other circulatory complications: Secondary | ICD-10-CM

## 2015-07-07 DIAGNOSIS — IMO0002 Reserved for concepts with insufficient information to code with codable children: Secondary | ICD-10-CM

## 2015-07-07 DIAGNOSIS — E038 Other specified hypothyroidism: Secondary | ICD-10-CM | POA: Diagnosis not present

## 2015-07-07 DIAGNOSIS — E1165 Type 2 diabetes mellitus with hyperglycemia: Secondary | ICD-10-CM

## 2015-07-07 DIAGNOSIS — E785 Hyperlipidemia, unspecified: Secondary | ICD-10-CM

## 2015-07-07 DIAGNOSIS — R634 Abnormal weight loss: Secondary | ICD-10-CM | POA: Diagnosis not present

## 2015-07-07 DIAGNOSIS — E538 Deficiency of other specified B group vitamins: Secondary | ICD-10-CM

## 2015-07-07 LAB — LIPID PANEL
Cholesterol: 106 mg/dL (ref 0–200)
HDL: 38.6 mg/dL — ABNORMAL LOW (ref >39.00)
LDL Cholesterol: 50 mg/dL (ref 0–99)
NonHDL: 67.4
Total CHOL/HDL Ratio: 3
Triglycerides: 85 mg/dL (ref 0.0–149.0)
VLDL: 17 mg/dL (ref 0.0–40.0)

## 2015-07-07 LAB — VITAMIN B12: Vitamin B-12: 614 pg/mL (ref 211–911)

## 2015-07-07 LAB — HEMOGLOBIN A1C: Hgb A1c MFr Bld: 7 % — ABNORMAL HIGH (ref 4.6–6.5)

## 2015-07-07 LAB — TSH: TSH: 3.33 u[IU]/mL (ref 0.35–4.50)

## 2015-07-07 MED ORDER — METOPROLOL SUCCINATE ER 25 MG PO TB24: 12.5000 mg | ORAL_TABLET | Freq: Every day | ORAL | Status: DC

## 2015-07-07 NOTE — Assessment & Plan Note (Addendum)
Well-controlled but pulse is very low, will decrease Toprol-XL 25 mg daily to 12.5 mg daily. Asked patient to call if BP more than 140/80 and pulse less than 50 consistently

## 2015-07-07 NOTE — Assessment & Plan Note (Signed)
Good compliance of medication, check labs

## 2015-07-07 NOTE — Assessment & Plan Note (Signed)
Good compliance of medication, check A1c

## 2015-07-07 NOTE — Patient Instructions (Addendum)
Get your blood work before you leave    Change metoprolol to 25 mg, half tablet daily

## 2015-07-07 NOTE — Progress Notes (Signed)
Subjective:    Patient ID: Ethan Mcneil, male    DOB: [birth year], 81 y.o.   MRN: [id number]  DOS:  07/07/2015 Type of visit - description : Follow-up Interval history:  Vitamins deficiency: Good compliance with supplements Continue to feeling fatigue, "run out of steam" as he start to do something. Hypertension, good compliance of medication, heart rate today is very low, she does not feel particularly fatigued or tired today. Diabetes, good compliance of medication.   Review of Systems Denies chest pain or difficulty breathing. No DOE No claudication No anxiety or depression. Left leg swelling at baseline.  Past Medical History  Diagnosis Date  . PVD (peripheral vascular disease)   . Unspecified hyperplasia of prostate without urinary obstruction and other lower urinary tract symptoms (LUTS)   . DM2 (diabetes mellitus, type 2)   . HTN (hypertension)     essential nos  . HLD (hyperlipidemia)   . CAD (coronary artery disease)     CABG 1997; grafts patent @ cath [facility] 11/09  . S/P herniorrhaphy   . Postsurgical aortocoronary bypass status   . Arthritis   . Tick fever 2012    [facility] , [place]  . Gout     after tick bite  . Kidney stones     kidney stones  . Hypothyroid     Past Surgical History  Procedure Laterality Date  . Coronary artery bypass graft      x7 vessels 1997; cath 2002 and 2009; grafts patent   . Shoulder surgery    . Colonoscopy  2012    polyps   . Inguinal hernia repair    . Cystoscopy with retrograde pyelogram, ureteroscopy and stent placement Right 02/21/2014    Procedure: CYSTOSCOPY WITH RIGHT  RETROGRADE PYELOGRAM,RIGHT  URETEROSCOPY WITH STONE BASKETING EXTRACTION;  Surgeon: Irine Seal, MD;  Location: [facility];  Service: Urology;  Laterality: Right;    History   Social History  . Marital Status: Married    Spouse Name: N/A  . Number of Children: 4  . Years of Education: N/A   Occupational History  . retired, works few  hours     Social History Main Topics  . Smoking status: Never Smoker   . Smokeless tobacco: Never Used  . Alcohol Use: No  . Drug Use: No  . Sexual Activity: Not on file   Other Topics Concern  . Not on file   Social History Narrative   Buyer, retail and former race car driver   4 children (boys)   Designated party form signed appointing wife - [name]; ok to leave msg on home phone [phone]                 Medication List       This list is accurate as of: 07/07/15  8:26 PM.  Always use your most recent med list.               aspirin EC 81 MG tablet  Take 81 mg by mouth daily.     cyanocobalamin 1000 MCG/ML injection  Commonly known as:  (VITAMIN B-12)  Inject 1 mL (1,000 mcg total) into the muscle every 30 (thirty) days.     FLAX OIL XTRA Caps  Take 1 capsule by mouth daily. 1300 mg     glimepiride 2 MG tablet  Commonly known as:  AMARYL  Take 1 tablet (2 mg total) by mouth daily with breakfast.     glucose  blood test strip  Commonly known as:  FREESTYLE TEST STRIPS  Check blood sugar no more than twice daily.     ibuprofen 200 MG tablet  Commonly known as:  ADVIL,MOTRIN  Take 400 mg by mouth every 6 (six) hours as needed (Pain).     ketoconazole 2 % cream  Commonly known as:  NIZORAL  Apply 1 application topically daily.     levothyroxine 50 MCG tablet  Commonly known as:  SYNTHROID, LEVOTHROID  Take 1 tablet (50 mcg total) by mouth daily.     metFORMIN 850 MG tablet  Commonly known as:  GLUCOPHAGE  Take 1 tablet (850 mg total) by mouth 2 (two) times daily with a meal.     metoprolol succinate 25 MG 24 hr tablet  Commonly known as:  TOPROL-XL  Take 0.5 tablets (12.5 mg total) by mouth daily.     NEEDLE (DISP) 25 G 25G X 5/8" Misc  Commonly known as:  BD DISP NEEDLES  To be used once while injecting medication monthly.     oxyCODONE-acetaminophen 5-325 MG per tablet  Commonly known as:  PERCOCET/ROXICET  Take 1-2 tablets by  mouth every 6 (six) hours as needed for severe pain (kidney stones).     ramipril 2.5 MG capsule  Commonly known as:  ALTACE  Take 1 capsule (2.5 mg total) by mouth daily.     saxagliptin HCl 5 MG Tabs tablet  Commonly known as:  ONGLYZA  Take 1 tablet (5 mg total) by mouth daily.     simvastatin 20 MG tablet  Commonly known as:  ZOCOR  Take 1 tablet (20 mg total) by mouth at bedtime.           Objective:   Physical Exam BP 128/74 mmHg  Pulse 41  Temp(Src) 97.5 F (36.4 C) (Oral)  Ht 5\' 10"  (1.778 m)  Wt 153 lb 4 oz (69.514 kg)  BMI 21.99 kg/m2  SpO2 98% General:   Well developed, well nourished . NAD.  HEENT:  Normocephalic . Face symmetric, atraumatic Neck: No thyromegaly Lungs:  CTA B Normal respiratory effort, no intercostal retractions, no accessory muscle use. Heart: RRR,  no murmur.  Left leg edema at baseline Skin: Not pale. Not jaundice Neurologic:  alert & oriented X3.  Speech normal, gait appropriate for age and unassisted Psych--  Cognition and judgment appear intact.  Cooperative with normal attention span and concentration.  Behavior appropriate. No anxious or depressed appearing.      Assessment & Plan:

## 2015-07-07 NOTE — Assessment & Plan Note (Signed)
Recheck TSH today, he is feeling somewhat fatigued, will adjust the dose of thyroid based on results, target  TSH around 1.

## 2015-07-07 NOTE — Progress Notes (Signed)
Pre visit review using our clinic review tool, if applicable. No additional management support is needed unless otherwise documented below in the visit note. 

## 2015-07-07 NOTE — Assessment & Plan Note (Signed)
Continue with fatigue, likely multifactorial. Weight loss has stopped. Plan: We are adjusting his Synthroid if necessary, check vitamin levels.

## 2015-07-10 LAB — VITAMIN D 1,25 DIHYDROXY
Vitamin D 1, 25 (OH)2 Total: 24 pg/mL (ref 18–72)
Vitamin D2 1, 25 (OH)2: <8 pg/mL
Vitamin D3 1, 25 (OH)2: 24 pg/mL

## 2015-07-12 MED ORDER — METFORMIN HCL 1000 MG PO TABS: 1000.0000 mg | ORAL_TABLET | Freq: Two times a day (BID) | ORAL | Status: DC

## 2015-07-12 MED ORDER — LEVOTHYROXINE SODIUM 75 MCG PO TABS
75.0000 ug | ORAL_TABLET | Freq: Every day | ORAL | Status: DC
Start: 2015-07-12 — End: 2015-11-27

## 2015-07-12 NOTE — Addendum Note (Signed)
Addended by: Wilfrid Lund on: 07/12/2015 10:27 AM   Modules accepted: Orders, Medications

## 2015-09-07 ENCOUNTER — Telehealth: Payer: Self-pay | Admitting: Internal Medicine

## 2015-09-07 NOTE — Telephone Encounter (Signed)
Due for a TSH, please arrange

## 2015-09-08 NOTE — Telephone Encounter (Signed)
Letter printed and mailed to Pt. Instructed him to call office ASAP to schedule a lab appt to recheck TSH from 07/07/2015 OV with Dr. Larose Kells.

## 2015-09-15 DIAGNOSIS — H00014 Hordeolum externum left upper eyelid: Secondary | ICD-10-CM | POA: Diagnosis not present

## 2015-11-22 ENCOUNTER — Telehealth: Payer: Self-pay | Admitting: Behavioral Health

## 2015-11-22 NOTE — Telephone Encounter (Signed)
Unable to reach patient at time of Pre-Visit Call.  Left message for patient to return call when available.    

## 2015-11-23 ENCOUNTER — Ambulatory Visit (INDEPENDENT_AMBULATORY_CARE_PROVIDER_SITE_OTHER): Payer: Medicare Other | Admitting: Internal Medicine

## 2015-11-23 ENCOUNTER — Encounter: Payer: Self-pay | Admitting: Internal Medicine

## 2015-11-23 ENCOUNTER — Other Ambulatory Visit: Payer: Self-pay | Admitting: Internal Medicine

## 2015-11-23 VITALS — BP 122/66 | HR 56 | Temp 98.1°F | Ht 70.0 in | Wt 147.2 lb

## 2015-11-23 DIAGNOSIS — I10 Essential (primary) hypertension: Secondary | ICD-10-CM | POA: Diagnosis not present

## 2015-11-23 DIAGNOSIS — Z09 Encounter for follow-up examination after completed treatment for conditions other than malignant neoplasm: Secondary | ICD-10-CM

## 2015-11-23 DIAGNOSIS — D649 Anemia, unspecified: Secondary | ICD-10-CM | POA: Diagnosis not present

## 2015-11-23 DIAGNOSIS — Z23 Encounter for immunization: Secondary | ICD-10-CM | POA: Diagnosis not present

## 2015-11-23 DIAGNOSIS — D229 Melanocytic nevi, unspecified: Secondary | ICD-10-CM

## 2015-11-23 DIAGNOSIS — Z0000 Encounter for general adult medical examination without abnormal findings: Secondary | ICD-10-CM

## 2015-11-23 DIAGNOSIS — E538 Deficiency of other specified B group vitamins: Secondary | ICD-10-CM | POA: Diagnosis not present

## 2015-11-23 DIAGNOSIS — E119 Type 2 diabetes mellitus without complications: Secondary | ICD-10-CM | POA: Diagnosis not present

## 2015-11-23 DIAGNOSIS — E039 Hypothyroidism, unspecified: Secondary | ICD-10-CM | POA: Diagnosis not present

## 2015-11-23 LAB — BASIC METABOLIC PANEL
BUN: 12 mg/dL (ref 6–23)
CO2: 27 mEq/L (ref 19–32)
Calcium: 8.5 mg/dL (ref 8.4–10.5)
Chloride: 104 mEq/L (ref 96–112)
Creatinine, Ser: 1.16 mg/dL (ref 0.40–1.50)
GFR: 64.5 mL/min (ref >60.00)
Glucose, Bld: 142 mg/dL — ABNORMAL HIGH (ref 70–99)
Potassium: 4.3 mEq/L (ref 3.5–5.1)
Sodium: 137 mEq/L (ref 135–145)

## 2015-11-23 LAB — CBC WITH DIFFERENTIAL/PLATELET
Basophils Absolute: 0.1 10*3/uL (ref 0.0–0.1)
Basophils Relative: 1.1 % (ref 0.0–3.0)
Eosinophils Absolute: 0.5 10*3/uL (ref 0.0–0.7)
Eosinophils Relative: 8 % — ABNORMAL HIGH (ref 0.0–5.0)
HCT: 35.2 % — ABNORMAL LOW (ref 39.0–52.0)
Hemoglobin: 11.4 g/dL — ABNORMAL LOW (ref 13.0–17.0)
Lymphocytes Relative: 26.9 % (ref 12.0–46.0)
Lymphs Abs: 1.7 10*3/uL (ref 0.7–4.0)
MCHC: 32.4 g/dL (ref 30.0–36.0)
MCV: 91.4 fl (ref 78.0–100.0)
Monocytes Absolute: 0.5 10*3/uL (ref 0.1–1.0)
Monocytes Relative: 7.8 % (ref 3.0–12.0)
Neutro Abs: 3.6 10*3/uL (ref 1.4–7.7)
Neutrophils Relative %: 56.2 % (ref 43.0–77.0)
Platelets: 233 10*3/uL (ref 150.0–400.0)
RBC: 3.85 Mil/uL — ABNORMAL LOW (ref 4.22–5.81)
RDW: 14.6 % (ref 11.5–15.5)
WBC: 6.5 10*3/uL (ref 4.0–10.5)

## 2015-11-23 LAB — TSH: TSH: 5.58 u[IU]/mL — ABNORMAL HIGH (ref 0.35–4.50)

## 2015-11-23 LAB — HM DIABETES FOOT EXAM: HM Diabetic Foot Exam: NORMAL

## 2015-11-23 LAB — HEMOGLOBIN A1C: Hgb A1c MFr Bld: 7.5 % — ABNORMAL HIGH (ref 4.6–6.5)

## 2015-11-23 LAB — VITAMIN B12: Vitamin B-12: 246 pg/mL (ref 211–911)

## 2015-11-23 NOTE — Progress Notes (Signed)
Subjective:    Patient ID: Ethan Mcneil, male    DOB: Oct 19, 1934, 81 y.o.   MRN: [id number]  DOS:  11/23/2015 Type of visit - description : Routine visit  Here for Medicare AWV:  1. Risk factors based on Past M, S, F history: reviewed 2. Physical Activities: active in his shop, takes walks during the summer    3. Depression/mood: neg screening   4. Hearing:  Some problems chronic tinnitus, not worse, no problems w/ problems w/ normal conversation 5. ADL's:   independent 6. Fall Risk: no recent falls, see instructios 7. home Safety: does feel safe at home   8. Height, weight, & visual acuity: see VS, sees eye doctor q year  9. Counseling: provided 10. Labs ordered based on risk factors: if needed   11. Referral Coordination: if needed 12. Care Plan, see assessment and plan   13. Cognitive Assessment:Motor skills and cognition appropriate for age 49. Providers list updated 15. End of life care: has a HC POA  We also discussed the following: CAD: Asymptomatic, good compliance of medication DM: CBGs in the 120s. Complain of diarrhea when he takes a full dose of metformin, taking less metformin on regular basis Edema, left leg: Ongoing problem, no discomfort, pain. Hypothyroidism: Good compliance with medications.   Review of Systems  Constitutional: No fever. No chills. No unexplained wt changes. No unusual sweats  HEENT: No dental problems, no ear discharge, no facial swelling, no voice changes. No eye discharge, no eye  redness , no  intolerance to light   Respiratory: No wheezing , no  difficulty breathing. No cough , no mucus production  Cardiovascular: No CP, chronic left leg swelling at baseline , no  Palpitations  GI: no nausea, no vomiting, no diarrhea , no  abdominal pain.  No blood in the stools. No dysphagia, no odynophagia    Endocrine: No polyphagia, no polyuria , no polydipsia  GU: No dysuria, gross hematuria, difficulty urinating. No urinary urgency, no  frequency.  Musculoskeletal: No joint swellings or unusual aches or pains  Skin: No change in the color of the skin, palor , no  Rash  Allergic, immunologic: No environmental allergies , no  food allergies  Neurological: No dizziness no  syncope. No headaches. No diplopia, no slurred, no slurred speech, no motor deficits, no facial  Numbness  Hematological: No enlarged lymph nodes, no easy bruising , no unusual bleedings  Psychiatry: No suicidal ideas, no hallucinations, no beavior problems, no confusion.  No unusual/severe anxiety, no depression  Past Medical History  Diagnosis Date  . PVD (peripheral vascular disease) ([place])   . Unspecified hyperplasia of prostate without urinary obstruction and other lower urinary tract symptoms (LUTS)   . DM2 (diabetes mellitus, type 2) ([place])   . HTN (hypertension)     essential nos  . HLD (hyperlipidemia)   . CAD (coronary artery disease)     CABG 1997; grafts patent @ cath [facility] 11/09  . S/P herniorrhaphy   . Postsurgical aortocoronary bypass status   . Arthritis   . Tick fever 2012    [facility] , [place]  . Gout     after tick bite  . Kidney stones     kidney stones  . Hypothyroid     Past Surgical History  Procedure Laterality Date  . Coronary artery bypass graft      x7 vessels 1997; cath 2002 and 2009; grafts patent   . Shoulder surgery    .  Colonoscopy  2012    polyps   . Inguinal hernia repair    . Cystoscopy with retrograde pyelogram, ureteroscopy and stent placement Right 02/21/2014    Procedure: CYSTOSCOPY WITH RIGHT  RETROGRADE PYELOGRAM,RIGHT  URETEROSCOPY WITH STONE BASKETING EXTRACTION;  Surgeon: Irine Seal, MD;  Location: [facility];  Service: Urology;  Laterality: Right;    Social History   Social History  . Marital Status: Married    Spouse Name: [name]  . Number of Children: 4  . Years of Education: N/A   Occupational History  . retired, works few hours     Social History Main Topics  . Smoking  status: Never Smoker   . Smokeless tobacco: Never Used  . Alcohol Use: No  . Drug Use: No  . Sexual Activity: Not on file   Other Topics Concern  . 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 Lung cancer Father   . Coronary artery disease Mother     stent  . Diabetes Brother     DM   . Prostate cancer Maternal Uncle     in his 59s  . Colon cancer Neg Hx   . Stroke Other     GF      Medication List       This list is accurate as of: 11/23/15 11:59 PM.  Always use your most recent med list.               aspirin EC 81 MG tablet  Take 81 mg by mouth daily.     cyanocobalamin 1000 MCG/ML injection  Commonly known as:  (VITAMIN B-12)  Inject 1 mL (1,000 mcg total) into the muscle every 30 (thirty) days.     FLAX OIL XTRA Caps  Take 1 capsule by mouth daily. 1300 mg     glimepiride 2 MG tablet  Commonly known as:  AMARYL  Take 1 tablet (2 mg total) by mouth daily with breakfast.     glucose blood test strip  Commonly known as:  FREESTYLE TEST STRIPS  Check blood sugar no more than twice daily.     ibuprofen 200 MG tablet  Commonly known as:  ADVIL,MOTRIN  Take 400 mg by mouth every 6 (six) hours as needed (Pain).     ketoconazole 2 % cream  Commonly known as:  NIZORAL  Apply 1 application topically daily.     levothyroxine 75 MCG tablet  Commonly known as:  SYNTHROID, LEVOTHROID  Take 1 tablet (75 mcg total) by mouth daily before breakfast.     metFORMIN 850 MG tablet  Commonly known as:  GLUCOPHAGE  Take 850 mg by mouth 2 (two) times daily with a meal.     metoprolol succinate 25 MG 24 hr tablet  Commonly known as:  TOPROL-XL  Take 0.5 tablets (12.5 mg total) by mouth daily.     NEEDLE (DISP) 25 G 25G X 5/8" Misc    Commonly known as:  BD DISP NEEDLES  To be used once while injecting medication monthly.     oxyCODONE-acetaminophen 5-325 MG tablet  Commonly known as:  PERCOCET/ROXICET  Take 1-2 tablets by mouth every 6 (six) hours  as needed for severe pain (kidney stones).     ramipril 2.5 MG capsule  Commonly known as:  ALTACE  Take 1 capsule (2.5 mg total) by mouth daily.     saxagliptin HCl 5 MG Tabs tablet  Commonly known as:  ONGLYZA  Take 1 tablet (5 mg total) by mouth daily.     simvastatin 20 MG tablet  Commonly known as:  ZOCOR  Take 1 tablet (20 mg total) by mouth at bedtime.           Objective:   Physical Exam BP 122/66 mmHg  Pulse 56  Temp(Src) 98.1 F (36.7 C) (Oral)  Ht 5\' 10"  (1.778 m)  Wt 147 lb 4 oz (66.792 kg)  BMI 21.13 kg/m2  SpO2 97% General:   Well developed, well nourished . NAD.  Neck:  No  thyromegaly , normal carotid pulse HEENT:  Normocephalic . Face symmetric, atraumatic Lungs:  CTA B Normal respiratory effort, no intercostal retractions, no accessory muscle use. Heart: RRR,  no murmur.  No pretibial edema bilaterally  Abdomen:  Not distended, soft, non-tender. No rebound or rigidity.  Diabetic feet exam: Pedal pulses decreased but present, pinprick examination normal, Skene normal. Soft edema distal at the left leg. Skin: Exposed areas without rash. Not pale. Not jaundice Neurologic:  alert & oriented X3.  Speech normal, gait appropriate for age and unassisted Strength symmetric and appropriate for age.  Psych: Cognition and judgment appear intact.  Cooperative with normal attention span and concentration.  Behavior appropriate. No anxious or depressed appearing.    Assessment & Plan:   Assessment> DM --diarrhea with high dose of metformin HTN Hyperlipidemia Hypothyroidism DJD--rarely takes Oxycodone BPH, LUTS,h/o urolithiasis -- sees urology CV: --CAD, CABG 1997, cardiac cath 2009 --Peripheral vascular disease --L leg edema (-)  DVT per [place] 2015 Gout Anemia: Mild, normal iron 2015 B12 deficiency, mild 2015  H/o Tick fever 2012  PLAN: DM: Has diarrhea with high doses of metformin, currently taking 850 mg on average once a day. Check A1c. Further advice w/ result HTN: Seems well-controlled Hyperlipidemia: Last FLP satisfactory Hypothyroidism: Check a TSH CAD,PVD: asx, does not see cardiology regularly, plan is to control cardiovascular risk factors Mild anemia: Check a CBC B 12 deficiency: Check B12, recommend oral supplements Weight loss: Has lost some additional weight, ROS (-), he is really feeling well. Reassess in few months, patient will call in 3 months if the trend continue Addendum: Has a mole at the right temple, irregular shape. Will refer to dermatology RTC 6 months

## 2015-11-23 NOTE — Patient Instructions (Signed)
Get your blood work before you leave     Check the  blood pressure   weekly  Be sure your blood pressure is between 110/65 and  145/85.  if it is consistently higher or lower, let me know    Take B12 supplement daily   Next visit  for a routine visit in 6 months, fasting   (30 minutes) Please schedule an appointment at the front desk       Fall Prevention and [place] cause injuries and can affect all age groups. It is possible to use preventive measures to significantly decrease the likelihood of falls. There are many simple measures which can make your home safer and prevent falls. OUTDOORS  Repair cracks and edges of walkways and driveways.  Remove high doorway thresholds.  Trim shrubbery on the main path into your home.  Have good outside lighting.  Clear walkways of tools, rocks, debris, and clutter.  Check that handrails are not broken and are securely fastened. Both sides of steps should have handrails.  Have leaves, snow, and ice cleared regularly.  Use sand or salt on walkways during winter months.  In the garage, clean up grease or oil spills. BATHROOM  Install night lights.  Install grab bars by the toilet and in the tub and shower.  Use non-skid mats or decals in the tub or shower.  Place a plastic non-slip stool in the shower to sit on, if needed.  Keep floors dry and clean up all water on the floor immediately.  Remove soap buildup in the tub or shower on a regular basis.  Secure bath mats with non-slip, double-sided rug tape.  Remove throw rugs and tripping hazards from the floors. BEDROOMS  Install night lights.  Make sure a bedside light is easy to reach.  Do not use oversized bedding.  Keep a telephone by your bedside.  Have a firm chair with side arms to use for getting dressed.  Remove throw rugs and tripping hazards from the floor. KITCHEN  Keep handles on pots and pans turned toward the center of the stove. Use back  burners when possible.  Clean up spills quickly and allow time for drying.  Avoid walking on wet floors.  Avoid hot utensils and knives.  Position shelves so they are not too high or low.  Place commonly used objects within easy reach.  If necessary, use a sturdy step stool with a grab bar when reaching.  Keep electrical cables out of the way.  Do not use floor polish or wax that makes floors slippery. If you must use wax, use non-skid floor wax.  Remove throw rugs and tripping hazards from the floor. STAIRWAYS  Never leave objects on stairs.  Place handrails on both sides of stairways and use them. Fix any loose handrails. Make sure handrails on both sides of the stairways are as long as the stairs.  Check carpeting to make sure it is firmly attached along stairs. Make repairs to worn or loose carpet promptly.  Avoid placing throw rugs at the top or bottom of stairways, or properly secure the rug with carpet tape to prevent slippage. Get rid of throw rugs, if possible.  Have an electrician put in a light switch at the top and bottom of the stairs. OTHER FALL PREVENTION TIPS  Wear low-heel or rubber-soled shoes that are supportive and fit well. Wear closed toe shoes.  When using a stepladder, make sure it is fully opened and both spreaders are  firmly locked. Do not climb a closed stepladder.  Add color or contrast paint or tape to grab bars and handrails in your home. Place contrasting color strips on first and last steps.  Learn and use mobility aids as needed. Install an electrical emergency response system.  Turn on lights to avoid dark areas. Replace light bulbs that burn out immediately. Get light switches that glow.  Arrange furniture to create clear pathways. Keep furniture in the same place.  Firmly attach carpet with non-skid or double-sided tape.  Eliminate uneven floor surfaces.  Select a carpet pattern that does not visually hide the edge of steps.  Be  aware of all pets. OTHER HOME SAFETY TIPS  Set the water temperature for 120 F (48.8 C).  Keep emergency numbers on or near the telephone.  Keep smoke detectors on every level of the home and near sleeping areas. Document Released: 11/22/2002 Document Revised: 06/02/2012 Document Reviewed: 02/21/2012 [facility] Patient Information 2015 [place], [place]. This information is not intended to replace advice given to you by your health care provider. Make sure you discuss any questions you have with your health care provider.   Preventive Care for Adults Ages 70 and over  Blood pressure check.** / Every 1 to 2 years.  Lipid and cholesterol check.**/ Every 5 years beginning at age 71.  Lung cancer screening. / Every year if you are aged 74-80 years and have a 30-pack-year history of smoking and currently smoke or have quit within the past 15 years. Yearly screening is stopped once you have quit smoking for at least 15 years or develop a health problem that would prevent you from having lung cancer treatment.  Fecal occult blood test (FOBT) of stool. / Every year beginning at age 50 and continuing until age 74. You may not have to do this test if you get a colonoscopy every 10 years.  Flexible sigmoidoscopy** or colonoscopy.** / Every 5 years for a flexible sigmoidoscopy or every 10 years for a colonoscopy beginning at age 34 and continuing until age 81.  Hepatitis C blood test.** / For all people born from [birth year] through 1963 and any individual with known risks for hepatitis C.  Abdominal aortic aneurysm (AAA) screening.** / A one-time screening for ages 42 to 53 years who are current or former smokers.  Skin self-exam. / Monthly.  Influenza vaccine. / Every year.  Tetanus, diphtheria, and acellular pertussis (Tdap/Td) vaccine.** / 1 dose of Td every 10 years.  Varicella vaccine.** / Consult your health care provider.  Zoster vaccine.** / 1 dose for adults aged 61 years or  older.  Pneumococcal 13-valent conjugate (PCV13) vaccine.** / Consult your health care provider.  Pneumococcal polysaccharide (PPSV23) vaccine.** / 1 dose for all adults aged 48 years and older.  Meningococcal vaccine.** / Consult your health care provider.  Hepatitis A vaccine.** / Consult your health care provider.  Hepatitis B vaccine.** / Consult your health care provider.  Haemophilus influenzae type b (Hib) vaccine.** / Consult your health care provider. **Family history and personal history of risk and conditions may change your health care provider's recommendations. Document Released: 01/28/2002 Document Revised: 12/07/2013 Document Reviewed: 04/29/2011 [facility] Patient Information 2015 [place], [place]. This information is not intended to replace advice given to you by your health care provider. Make sure you discuss any questions you have with your health care provider.

## 2015-11-23 NOTE — Telephone Encounter (Signed)
Pt seen today, awaiting lab results  

## 2015-11-23 NOTE — Assessment & Plan Note (Addendum)
Td 2015 flu shot -- today PNM 23 shot-- 2011 PNM 13--- 11-2015 zostavax -- 2015  [facility]--07/2011--Dr Perry-adenomatous polyps--next due 2017   PSA-- sees urology frequently

## 2015-11-23 NOTE — Progress Notes (Signed)
Pre visit review using our clinic review tool, if applicable. No additional management support is needed unless otherwise documented below in the visit note. 

## 2015-11-24 NOTE — Telephone Encounter (Signed)
Please advise if Pt should be on Metformin 850 mg bid of Metformin 1000 mg bid?

## 2015-11-26 DIAGNOSIS — Z09 Encounter for follow-up examination after completed treatment for conditions other than malignant neoplasm: Secondary | ICD-10-CM | POA: Insufficient documentation

## 2015-11-26 NOTE — Assessment & Plan Note (Signed)
DM: Has diarrhea with high doses of metformin, currently taking 850 mg on average once a day. Check A1c. Further advice w/ result HTN: Seems well-controlled Hyperlipidemia: Last FLP satisfactory Hypothyroidism: Check a TSH CAD,PVD: asx, does not see cardiology regularly, plan is to control cardiovascular risk factors Mild anemia: Check a CBC B 12 deficiency: Check B12, recommend oral supplements Weight loss: Has lost some additional weight, ROS (-), he is really feeling well. Reassess in few months, patient will call in 3 months if the trend continue Addendum: Has a mole at the right temple, irregular shape. Will refer to dermatology RTC 6 months

## 2015-11-27 MED ORDER — LEVOTHYROXINE SODIUM 100 MCG PO TABS: 100.0000 ug | ORAL_TABLET | Freq: Every day | ORAL | Status: DC

## 2015-11-27 MED ORDER — METFORMIN HCL 850 MG PO TABS: 850.0000 mg | ORAL_TABLET | Freq: Every day | ORAL | Status: DC

## 2015-11-27 MED ORDER — GLIMEPIRIDE 2 MG PO TABS: 4.0000 mg | ORAL_TABLET | Freq: Every day | ORAL | Status: DC

## 2015-11-27 NOTE — Addendum Note (Signed)
Addended by: Damita Dunnings D on: 11/27/2015 01:35 PM   Modules accepted: Orders, Medications

## 2015-11-27 NOTE — Telephone Encounter (Signed)
See results, he needs  metformin 850 mg one tablet daily. Send a refill if needed

## 2015-12-25 ENCOUNTER — Other Ambulatory Visit: Payer: Self-pay

## 2015-12-25 DIAGNOSIS — L820 Inflamed seborrheic keratosis: Secondary | ICD-10-CM | POA: Diagnosis not present

## 2015-12-25 DIAGNOSIS — L570 Actinic keratosis: Secondary | ICD-10-CM | POA: Diagnosis not present

## 2015-12-25 DIAGNOSIS — D485 Neoplasm of uncertain behavior of skin: Secondary | ICD-10-CM | POA: Diagnosis not present

## 2016-02-13 NOTE — Telephone Encounter (Signed)
Message sent via MyChart.

## 2016-02-13 NOTE — Telephone Encounter (Signed)
-----   Message from Colon Branch, MD sent at 02/12/2016  5:46 PM EST ----- Regarding: Please send a my chart message, needs [facility] Ethan Mcneil, is time to check your labs to be sure your thyroid is well balanced, please call the office and make an appointment

## 2016-03-25 ENCOUNTER — Other Ambulatory Visit: Payer: Self-pay | Admitting: Internal Medicine

## 2016-04-17 ENCOUNTER — Other Ambulatory Visit: Payer: Self-pay | Admitting: Internal Medicine

## 2016-04-17 NOTE — Telephone Encounter (Signed)
Rx sent to the pharmacy by e-script.//AB/CMA 

## 2016-05-07 ENCOUNTER — Other Ambulatory Visit: Payer: Self-pay | Admitting: Internal Medicine

## 2016-05-15 ENCOUNTER — Other Ambulatory Visit: Payer: Self-pay | Admitting: Internal Medicine

## 2016-05-23 ENCOUNTER — Ambulatory Visit (INDEPENDENT_AMBULATORY_CARE_PROVIDER_SITE_OTHER): Payer: Medicare Other | Admitting: Internal Medicine

## 2016-05-23 ENCOUNTER — Encounter: Payer: Self-pay | Admitting: Internal Medicine

## 2016-05-23 VITALS — BP 122/78 | HR 50 | Temp 97.8°F | Ht 70.0 in | Wt 145.4 lb

## 2016-05-23 DIAGNOSIS — E039 Hypothyroidism, unspecified: Secondary | ICD-10-CM

## 2016-05-23 DIAGNOSIS — I10 Essential (primary) hypertension: Secondary | ICD-10-CM | POA: Diagnosis not present

## 2016-05-23 DIAGNOSIS — E118 Type 2 diabetes mellitus with unspecified complications: Secondary | ICD-10-CM | POA: Diagnosis not present

## 2016-05-23 DIAGNOSIS — E538 Deficiency of other specified B group vitamins: Secondary | ICD-10-CM | POA: Diagnosis not present

## 2016-05-23 DIAGNOSIS — R634 Abnormal weight loss: Secondary | ICD-10-CM

## 2016-05-23 LAB — VITAMIN B12: Vitamin B-12: 340 pg/mL (ref 211–911)

## 2016-05-23 LAB — COMPREHENSIVE METABOLIC PANEL
ALT: 26 U/L (ref 0–53)
AST: 21 U/L (ref 0–37)
Albumin: 3.1 g/dL — ABNORMAL LOW (ref 3.5–5.2)
Alkaline Phosphatase: 46 U/L (ref 39–117)
BUN: 12 mg/dL (ref 6–23)
CO2: 31 mEq/L (ref 19–32)
Calcium: 8.4 mg/dL (ref 8.4–10.5)
Chloride: 104 mEq/L (ref 96–112)
Creatinine, Ser: 1.16 mg/dL (ref 0.40–1.50)
GFR: 64.42 mL/min (ref >60.00)
Glucose, Bld: 129 mg/dL — ABNORMAL HIGH (ref 70–99)
Potassium: 4.6 mEq/L (ref 3.5–5.1)
Sodium: 137 mEq/L (ref 135–145)
Total Bilirubin: 0.2 mg/dL (ref 0.2–1.2)
Total Protein: 5.1 g/dL — ABNORMAL LOW (ref 6.0–8.3)

## 2016-05-23 LAB — TSH: TSH: 5.99 u[IU]/mL — ABNORMAL HIGH (ref 0.35–4.50)

## 2016-05-23 LAB — HEMOGLOBIN A1C: Hgb A1c MFr Bld: 8.1 % — ABNORMAL HIGH (ref 4.6–6.5)

## 2016-05-23 MED ORDER — SAXAGLIPTIN HCL 5 MG PO TABS: 5.0000 mg | ORAL_TABLET | Freq: Every day | ORAL | Status: DC

## 2016-05-23 MED ORDER — HYDROCORTISONE 2.5 % EX CREA: TOPICAL_CREAM | Freq: Two times a day (BID) | CUTANEOUS | Status: DC

## 2016-05-23 NOTE — Progress Notes (Signed)
Pre visit review using our clinic review tool, if applicable. No additional management support is needed unless otherwise documented below in the visit note. 

## 2016-05-23 NOTE — Patient Instructions (Addendum)
GO TO THE LAB : Get the blood work     GO TO THE FRONT DESK Schedule your next appointment for a  routine checkup in 3 or 4 months   Stop metformin Take Onglyza, a prescription was sent. Take a OTC B12 supplement every day

## 2016-05-23 NOTE — Progress Notes (Signed)
Subjective:    Patient ID: Ethan Mcneil, male    DOB: 1933-12-27, 82 y.o.   MRN: [id number]  DOS:  05/23/2016 Type of visit - description :  rov Interval history: Weight loss: Appetite has improved, he feels overall okay ("how good can you feel when you are 82?"), weight loss has slow down, only lost 2 pounds per our scales B12 deficiency: Not taking shots regularly Complain of extremely itchy skin in between the shoulders. No rash HTN: Good medication compliance,  BPs when checked normal Hypothyroidism: Thyroid dose increased, good compliance DM: Metformin daily, apparently 1000 mg, continue causing loose stools. Apparently not taking Onglyza. CBGs 130s.   Review of Systems Denies difficulty breathing Lower extremity edema at baseline. No claudication No actual nausea, vomiting, diarrhea or blood in the stools. No postprandial abdominal pain.   Past Medical History  Diagnosis Date  . PVD (peripheral vascular disease) ([place])   . Unspecified hyperplasia of prostate without urinary obstruction and other lower urinary tract symptoms (LUTS)   . DM2 (diabetes mellitus, type 2) ([place])   . HTN (hypertension)     essential nos  . HLD (hyperlipidemia)   . CAD (coronary artery disease)     CABG 1997; grafts patent @ cath [facility] 11/09  . S/P herniorrhaphy   . Postsurgical aortocoronary bypass status   . Arthritis   . Tick fever 2012    [facility] , [place]  . Gout     after tick bite  . Kidney stones     kidney stones  . Hypothyroid     Past Surgical History  Procedure Laterality Date  . Coronary artery bypass graft      x7 vessels 1997; cath 2002 and 2009; grafts patent   . Shoulder surgery    . Colonoscopy  2012    polyps   . Inguinal hernia repair    . Cystoscopy with retrograde pyelogram, ureteroscopy and stent placement Right 02/21/2014    Procedure: CYSTOSCOPY WITH RIGHT  RETROGRADE PYELOGRAM,RIGHT  URETEROSCOPY WITH STONE BASKETING EXTRACTION;  Surgeon: Irine Seal, MD;  Location: [facility];  Service: Urology;  Laterality: Right;    Social History   Social History  . Marital Status: Married    Spouse Name: [name]  . Number of Children: 4  . Years of Education: N/A   Occupational History  . retired, works few hours     Social History Main Topics  . Smoking status: Never Smoker   . Smokeless tobacco: Never Used  . Alcohol Use: No  . Drug Use: No  . Sexual Activity: Not on file   Other Topics Concern  . Not on file   Social History Narrative   Buyer, retail and former race car driver   4 children (boys)   Designated party form signed appointing wife - [name]; ok to leave msg on home phone [phone]                 Medication List       This list is accurate as of: 05/23/16  4:24 PM.  Always use your most recent med list.               aspirin EC 81 MG tablet  Take 81 mg by mouth daily.     FLAX OIL XTRA Caps  Take 1 capsule by mouth daily. 1300 mg     glimepiride 2 MG tablet  Commonly known as:  AMARYL  Take 2 tablets (  4 mg total) by mouth daily with breakfast.     glucose blood test strip  Commonly known as:  FREESTYLE TEST STRIPS  Check blood sugar no more than twice daily.     hydrocortisone 2.5 % cream  Apply topically 2 (two) times daily.     levothyroxine 100 MCG tablet  Commonly known as:  SYNTHROID, LEVOTHROID  Take 1 tablet (100 mcg total) by mouth daily before breakfast.     metoprolol succinate 25 MG 24 hr tablet  Commonly known as:  TOPROL-XL  Take 0.5 tablets (12.5 mg total) by mouth daily.     oxyCODONE-acetaminophen 5-325 MG tablet  Commonly known as:  PERCOCET/ROXICET  Take 1-2 tablets by mouth every 6 (six) hours as needed for severe pain (kidney stones). Reported on 05/23/2016     ramipril 2.5 MG capsule  Commonly known as:  ALTACE  Take 1 capsule (2.5 mg total) by mouth daily.     saxagliptin HCl 5 MG Tabs tablet  Commonly known as:  ONGLYZA  Take 1 tablet (5 mg total) by  mouth daily.     simvastatin 20 MG tablet  Commonly known as:  ZOCOR  Take 1 tablet (20 mg total) by mouth at bedtime.           Objective:   Physical Exam BP 122/78 mmHg  Pulse 50  Temp(Src) 97.8 F (36.6 C) (Oral)  Ht 5\' 10"  (1.778 m)  Wt 145 lb 6 oz (65.942 kg)  BMI 20.86 kg/m2  SpO2 98% General:   Well developed, not ill appearing but looks underweight. No distress.  HEENT:  Normocephalic . Face symmetric, atraumatic Lungs:  CTA B Normal respiratory effort, no intercostal retractions, no accessory muscle use. Heart: RRR,  no murmur.  noticeable lower extremity edema, worse on the left Abdomen: Barely palpable aorta w/o bruit, no TTP; no  organomegaly. Skin: Not pale. Not jaundice Neurologic:  alert & oriented X3.  Speech normal, gait appropriate for age and unassisted Psych--  Cognition and judgment appear intact.  Cooperative with normal attention span and concentration.  Behavior appropriate. No anxious or depressed appearing.      Assessment & Plan:   Assessment> DM --diarrhea with high dose of metformin HTN Hyperlipidemia Hypothyroidism DJD--rarely takes Oxycodone BPH, LUTS,h/o urolithiasis -- sees urology CV: --CAD, CABG 1997, cardiac cath 2009 --Peripheral vascular disease --L leg edema, chronic, (-) DVT per [place] 2015 Gout Anemia: Mild, normal iron 2015 B12 deficiency, mild 2015  H/o Tick fever 2012  PLAN: DM: cont w/ loose stools ---> d/c metformin. Continue glimepiride, encouraged to take Onglyza, (apparently not taking it) Hypothyroidism:  Synthroid dose increased based on last labs, check a TSH Mild anemia: No iron deficiency, last hemoglobin stable B12 deficiency: On oral supplements only, check labs Weight loss: He has lost only 2 pounds in the last 6 months, weight seems to plateau. Etiology of wt loss is still unclear. No GI angina but he has loose stools felt to be due to metformin. If the loose stools don't correct after we stop  metformin, consider GI eval. Check a pre-albumin. Had a mole, s/p  Derm eval.   Pruritic skin: At the upper back, exam is essentially normal, recommend Aveeno and occ use of hydrocortisone RTC 3 months

## 2016-05-23 NOTE — Assessment & Plan Note (Signed)
DM: cont w/ loose stools ---> d/c metformin. Continue glimepiride, encouraged to take Onglyza, (apparently not taking it) Hypothyroidism:  Synthroid dose increased based on last labs, check a TSH Mild anemia: No iron deficiency, last hemoglobin stable B12 deficiency: On oral supplements only, check labs Weight loss: He has lost only 2 pounds in the last 6 months, weight seems to plateau. Etiology of wt loss is still unclear. No GI angina but he has loose stools felt to be due to metformin. If the loose stools don't correct after we stop metformin, consider GI eval. Check a pre-albumin. Had a mole, s/p  Derm eval.   Pruritic skin: At the upper back, exam is essentially normal, recommend Aveeno and occ use of hydrocortisone RTC 3 months

## 2016-05-24 LAB — PREALBUMIN: Prealbumin: 25 mg/dL (ref 21–43)

## 2016-05-24 MED ORDER — GLIMEPIRIDE 2 MG PO TABS: 6.0000 mg | ORAL_TABLET | Freq: Every day | ORAL | Status: DC

## 2016-05-24 MED ORDER — LEVOTHYROXINE SODIUM 150 MCG PO TABS: 150.0000 ug | ORAL_TABLET | Freq: Every day | ORAL | Status: DC

## 2016-07-15 ENCOUNTER — Other Ambulatory Visit: Payer: Self-pay | Admitting: Internal Medicine

## 2016-08-06 ENCOUNTER — Other Ambulatory Visit: Payer: Self-pay | Admitting: Internal Medicine

## 2016-08-07 ENCOUNTER — Encounter: Payer: Self-pay | Admitting: Internal Medicine

## 2016-08-18 ENCOUNTER — Other Ambulatory Visit: Payer: Self-pay | Admitting: Internal Medicine

## 2016-08-21 ENCOUNTER — Other Ambulatory Visit: Payer: Self-pay | Admitting: Internal Medicine

## 2016-08-24 ENCOUNTER — Other Ambulatory Visit: Payer: Self-pay | Admitting: Internal Medicine

## 2016-09-04 ENCOUNTER — Encounter: Payer: Self-pay | Admitting: Internal Medicine

## 2016-09-04 ENCOUNTER — Ambulatory Visit (INDEPENDENT_AMBULATORY_CARE_PROVIDER_SITE_OTHER): Payer: Medicare Other | Admitting: Internal Medicine

## 2016-09-04 VITALS — BP 122/76 | HR 56 | Temp 97.6°F | Resp 14 | Ht 70.0 in | Wt 140.4 lb

## 2016-09-04 DIAGNOSIS — E785 Hyperlipidemia, unspecified: Secondary | ICD-10-CM

## 2016-09-04 DIAGNOSIS — R634 Abnormal weight loss: Secondary | ICD-10-CM

## 2016-09-04 DIAGNOSIS — E46 Unspecified protein-calorie malnutrition: Secondary | ICD-10-CM | POA: Diagnosis not present

## 2016-09-04 DIAGNOSIS — Z23 Encounter for immunization: Secondary | ICD-10-CM

## 2016-09-04 DIAGNOSIS — E1169 Type 2 diabetes mellitus with other specified complication: Secondary | ICD-10-CM

## 2016-09-04 DIAGNOSIS — E039 Hypothyroidism, unspecified: Secondary | ICD-10-CM

## 2016-09-04 LAB — LIPID PANEL
Cholesterol: 93 mg/dL (ref 0–200)
HDL: 36 mg/dL — ABNORMAL LOW (ref >39.00)
LDL Cholesterol: 43 mg/dL (ref 0–99)
NonHDL: 57.22
Total CHOL/HDL Ratio: 3
Triglycerides: 71 mg/dL (ref 0.0–149.0)
VLDL: 14.2 mg/dL (ref 0.0–40.0)

## 2016-09-04 LAB — HEMOGLOBIN A1C: Hgb A1c MFr Bld: 8.5 % — ABNORMAL HIGH (ref 4.6–6.5)

## 2016-09-04 LAB — TSH: TSH: 0.08 u[IU]/mL — ABNORMAL LOW (ref 0.35–4.50)

## 2016-09-04 LAB — SEDIMENTATION RATE: Sed Rate: 1 mm/hr (ref 0–20)

## 2016-09-04 NOTE — Patient Instructions (Signed)
GO TO THE LAB : Get the blood work     GO TO THE FRONT DESK Schedule your next appointment for a   routine checkup in 4 months 

## 2016-09-04 NOTE — Progress Notes (Signed)
Subjective:    Patient ID: Ethan Mcneil, male    DOB: [birth year], 82 y.o.   MRN: [id number]  DOS:  09/04/2016 Type of visit - description : Follow-up Interval history: DM: Metformin was discontinued due to diarrhea, on Onglyza and glimepiride. The stools continued to be soft but not loose, 2 BMs a day. Hypothyroidism: Good compliance with medications HTN: Good compliance with medication, not ambulatory BPs Ongoing weight loss, patient concern, unclear etiology.   Wt Readings from Last 3 Encounters:  09/04/16 140 lb 6 oz (63.7 kg)  05/23/16 145 lb 6 oz (65.9 kg)  11/23/15 147 lb 4 oz (66.8 kg)    Review of Systems No fever chills Appetite and energy are fair. Denies chest pain or difficulty breathing No anxiety or depression No headaches   Past Medical History:  Diagnosis Date  . Arthritis   . CAD (coronary artery disease)    CABG 1997; grafts patent @ cath [facility] 11/09  . DM2 (diabetes mellitus, type 2) ([place])   . Gout    after tick bite  . HLD (hyperlipidemia)   . HTN (hypertension)    essential nos  . Hypothyroid   . Postsurgical aortocoronary bypass status   . PVD (peripheral vascular disease) ([place])   . S/P herniorrhaphy   . Tick fever 2012   [facility] , [place]  . Unspecified hyperplasia of prostate without urinary obstruction and other lower urinary tract symptoms (LUTS)   . Urolithiasis 2015   hx of    Past Surgical History:  Procedure Laterality Date  . CORONARY ARTERY BYPASS GRAFT     x7 vessels 1997; cath 2002 and 2009; grafts patent   . CYSTOSCOPY WITH RETROGRADE PYELOGRAM, URETEROSCOPY AND STENT PLACEMENT Right 02/21/2014   Procedure: CYSTOSCOPY WITH RIGHT  RETROGRADE PYELOGRAM,RIGHT  URETEROSCOPY WITH STONE BASKETING EXTRACTION;  Surgeon: Irine Seal, MD;  Location: [facility];  Service: Urology;  Laterality: Right;  . INGUINAL HERNIA REPAIR    . SHOULDER SURGERY      Social History   Social History  . Marital status: Married    Spouse  name: [name]  . Number of children: 4  . Years of education: N/A   Occupational History  . retired, works few hours     Social History Main Topics  . Smoking status: Never Smoker  . Smokeless tobacco: Never Used  . Alcohol use No  . Drug use: No  . Sexual activity: Not on file   Other Topics Concern  . Not on file   Social History Narrative   Buyer, retail and former race car driver   4 children (boys)   Designated party form signed appointing wife - [name]; ok to leave msg on home phone [phone]                 Medication List       Accurate as of 09/04/16  8:34 AM. Always use your most recent med list.          aspirin EC 81 MG tablet Take 81 mg by mouth daily.   FLAX OIL XTRA Caps Take 1 capsule by mouth daily. 1300 mg   glimepiride 2 MG tablet Commonly known as:  AMARYL Take 3 tablets (6 mg total) by mouth daily with breakfast.   glucose blood test strip Commonly known as:  FREESTYLE TEST STRIPS Check blood sugar no more than twice daily.   hydrocortisone 2.5 % cream Apply topically 2 (two) times daily.  levothyroxine 150 MCG tablet Commonly known as:  SYNTHROID, LEVOTHROID Take 1 tablet (150 mcg total) by mouth daily before breakfast.   metoprolol succinate 25 MG 24 hr tablet Commonly known as:  TOPROL-XL Take 0.5 tablets (12.5 mg total) by mouth daily.   oxyCODONE-acetaminophen 5-325 MG tablet Commonly known as:  PERCOCET/ROXICET Take 1-2 tablets by mouth every 6 (six) hours as needed for severe pain (kidney stones). Reported on 05/23/2016   ramipril 2.5 MG capsule Commonly known as:  ALTACE Take 1 capsule (2.5 mg total) by mouth daily.   saxagliptin HCl 5 MG Tabs tablet Commonly known as:  ONGLYZA Take 1 tablet (5 mg total) by mouth daily.   simvastatin 20 MG tablet Commonly known as:  ZOCOR Take 1 tablet (20 mg total) by mouth at bedtime.          Objective:   Physical Exam BP 122/76 (BP Location: Left Arm, Patient  Position: Sitting, Cuff Size: Small)   Pulse (!) 56   Temp 97.6 F (36.4 C) (Oral)   Resp 14   Ht 5\' 10"  (1.778 m)   Wt 140 lb 6 oz (63.7 kg)   SpO2 97%   BMI 20.14 kg/m  General:   Well developed, and overweight appearing . NAD.  HEENT:  Normocephalic . Face symmetric, atraumatic Lungs:  CTA B Normal respiratory effort, no intercostal retractions, no accessory muscle use. Heart: RRR,  no murmur.  Left lower extremity edema at baseline  Abdomen:  Not distended, soft, non-tender. No rebound or rigidity.  Skin: Not pale. Not jaundice Neurologic:  alert & oriented X3.  Speech normal, gait appropriate for age and unassisted Psych--  Cognition and judgment appear intact.  Cooperative with normal attention span and concentration.  Behavior appropriate. No anxious or depressed appearing.    Assessment & Plan:    Assessment> DM --diarrhea with high dose of metformin HTN Hyperlipidemia Hypothyroidism DJD BPH, LUTS,h/o urolithiasis -- sees urology CV: --CAD, CABG 1997, cardiac cath 2009 --Peripheral vascular disease --L leg edema, chronic, (-) DVT per [place] 2015 Gout Anemia: Mild, normal iron 2015 B12 deficiency, mild 2015  Wt loss: 2012 187 lb, 2014 173 lb , 2015 167 H/o Tick fever 2012  PLAN: DM: Metformin was d/c, still has "soft stools",  no actual diarrhea. Last A1c elevated, glimepiride was increased to 3 tablets a day but he is still taking one a day. Good compliance with Onglyza. Check A1c. Hyperlipidemia: Good compliance with simvastatin. Check A1c. Hypothyroidism: Last TSH elevated, Synthroid dose increased, check a TSH DJD: Not an issue, not taking pain medication Weight loss: Ongoing problem, gradual and chronic for several years, no clear etiology, no depression, fever, chills, no headaches. Does have soft stools. Will refer to GI. Check a pre-albumin, testosterone, sedimentation rate. RTC 4 months

## 2016-09-04 NOTE — Progress Notes (Signed)
Pre visit review using our clinic review tool, if applicable. No additional management support is needed unless otherwise documented below in the visit note. 

## 2016-09-04 NOTE — Assessment & Plan Note (Signed)
DM: Metformin was d/c, still has "soft stools",  no actual diarrhea. Last A1c elevated, glimepiride was increased to 3 tablets a day but he is still taking one a day. Good compliance with Onglyza. Check A1c. Hyperlipidemia: Good compliance with simvastatin. Check A1c. Hypothyroidism: Last TSH elevated, Synthroid dose increased, check a TSH DJD: Not an issue, not taking pain medication Weight loss: Ongoing problem, gradual and chronic for several years, no clear etiology, no depression, fever, chills, no headaches. Does have soft stools. Will refer to GI. Check a pre-albumin, testosterone, sedimentation rate. RTC 4 months

## 2016-09-05 LAB — PREALBUMIN: Prealbumin: 23 mg/dL (ref 21–43)

## 2016-09-09 MED ORDER — LEVOTHYROXINE SODIUM 125 MCG PO TABS: 125.0000 ug | ORAL_TABLET | Freq: Every day | ORAL | 0 refills | Status: DC

## 2016-09-09 MED ORDER — GLIMEPIRIDE 4 MG PO TABS: 6.0000 mg | ORAL_TABLET | Freq: Every day | ORAL | 5 refills | Status: DC

## 2016-09-09 NOTE — Addendum Note (Signed)
Addended byDamita Dunnings D on: 09/09/2016 04:00 PM   Modules accepted: Orders

## 2016-09-11 LAB — TESTOS,TOTAL,FREE AND SHBG (FEMALE)
Sex Hormone Binding Glob.: 68 nmol/L (ref 22–77)
Testosterone, Free: 48.2 pg/mL (ref 30.0–135.0)
Testosterone,Total,LC/MS/MS: 398 ng/dL (ref 250–1100)

## 2016-09-17 ENCOUNTER — Encounter: Payer: Self-pay | Admitting: Internal Medicine

## 2016-09-21 ENCOUNTER — Other Ambulatory Visit: Payer: Self-pay | Admitting: Internal Medicine

## 2016-10-12 ENCOUNTER — Other Ambulatory Visit: Payer: Self-pay | Admitting: Internal Medicine

## 2016-10-24 ENCOUNTER — Other Ambulatory Visit (INDEPENDENT_AMBULATORY_CARE_PROVIDER_SITE_OTHER): Payer: Medicare Other

## 2016-10-24 DIAGNOSIS — E039 Hypothyroidism, unspecified: Secondary | ICD-10-CM

## 2016-10-24 LAB — TSH: TSH: 0.43 u[IU]/mL (ref 0.35–4.50)

## 2016-11-14 ENCOUNTER — Encounter: Payer: Self-pay | Admitting: Internal Medicine

## 2016-11-14 ENCOUNTER — Ambulatory Visit (INDEPENDENT_AMBULATORY_CARE_PROVIDER_SITE_OTHER): Payer: Medicare Other | Admitting: Internal Medicine

## 2016-11-14 VITALS — BP 122/62 | HR 58 | Temp 97.7°F | Resp 14 | Ht 70.0 in | Wt 144.4 lb

## 2016-11-14 DIAGNOSIS — R21 Rash and other nonspecific skin eruption: Secondary | ICD-10-CM | POA: Diagnosis not present

## 2016-11-14 MED ORDER — BETAMETHASONE DIPROPIONATE AUG 0.05 % EX CREA: TOPICAL_CREAM | Freq: Two times a day (BID) | CUTANEOUS | 1 refills | Status: DC

## 2016-11-14 MED ORDER — GLUCOSE BLOOD VI STRP: ORAL_STRIP | 12 refills | Status: DC

## 2016-11-14 NOTE — Progress Notes (Signed)
Pre visit review using our clinic review tool, if applicable. No additional management support is needed unless otherwise documented below in the visit note. 

## 2016-11-14 NOTE — Patient Instructions (Signed)
Use OTC Aveeno lotion to moisturize the skin after a shower  Avoid hot showers  Use the new cream I sent to your pharmacy twice a day as needed at the area of rash and itching.  Call if not gradually improving.

## 2016-11-14 NOTE — Progress Notes (Signed)
Subjective:    Patient ID: Ethan Mcneil, male    DOB: 1934-02-05, 82 y.o.   MRN: [id number]  DOS:  11/14/2016 Type of visit - description : Acute visit Interval history: For the last week he is itching all over and has developed a rash at the back, proximal arms and rib cage. Hydrocortisone 2.5% helps.  Wt Readings from Last 3 Encounters:  11/14/16 144 lb 6 oz (65.5 kg)  09/04/16 140 lb 6 oz (63.7 kg)  05/23/16 145 lb 6 oz (65.9 kg)     Review of Systems Denies fever chills No more weight loss he has actually increased 4 pounds No nausea vomiting No new medications.   Past Medical History:  Diagnosis Date  . Arthritis   . CAD (coronary artery disease)    CABG 1997; grafts patent @ cath [facility] 11/09  . DM2 (diabetes mellitus, type 2) ([place])   . Gout    after tick bite  . HLD (hyperlipidemia)   . HTN (hypertension)    essential nos  . Hypothyroid   . Postsurgical aortocoronary bypass status   . PVD (peripheral vascular disease) ([place])   . S/P herniorrhaphy   . Tick fever 2012   [facility] , [place]  . Unspecified hyperplasia of prostate without urinary obstruction and other lower urinary tract symptoms (LUTS)   . Urolithiasis 2015   hx of    Past Surgical History:  Procedure Laterality Date  . CORONARY ARTERY BYPASS GRAFT     x7 vessels 1997; cath 2002 and 2009; grafts patent   . CYSTOSCOPY WITH RETROGRADE PYELOGRAM, URETEROSCOPY AND STENT PLACEMENT Right 02/21/2014   Procedure: CYSTOSCOPY WITH RIGHT  RETROGRADE PYELOGRAM,RIGHT  URETEROSCOPY WITH STONE BASKETING EXTRACTION;  Surgeon: Irine Seal, MD;  Location: [facility];  Service: Urology;  Laterality: Right;  . INGUINAL HERNIA REPAIR    . SHOULDER SURGERY      Social History   Social History  . Marital status: Married    Spouse name: [name]  . Number of children: 4  . Years of education: N/A   Occupational History  . retired, works few hours     Social History Main Topics  . Smoking status: Never  Smoker  . Smokeless tobacco: Never Used  . Alcohol use No  . Drug use: No  . Sexual activity: Not on file   Other Topics Concern  . 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 Always use your most recent med list.          aspirin EC 81 MG tablet Take 81 mg by mouth daily.   augmented betamethasone dipropionate 0.05 % cream Commonly known as:  DIPROLENE-AF Apply topically 2 (two) times daily.   FLAX OIL XTRA Caps Take 1 capsule by mouth daily. 1300 mg   glimepiride 4 MG tablet Commonly known as:  AMARYL Take 1.5 tablets (6 mg total) by mouth daily with breakfast.   glucose blood test strip Commonly known as:  FREESTYLE TEST STRIPS Check blood sugar no more than twice daily.   levothyroxine 125 MCG tablet Commonly known as:  SYNTHROID, LEVOTHROID Take 1  tablet (125 mcg total) by mouth daily before breakfast.   metoprolol succinate 25 MG 24 hr tablet Commonly known as:  TOPROL-XL Take 0.5 tablets (12.5 mg total) by mouth daily.   ramipril 2.5 MG capsule Commonly known as:  ALTACE Take 1 capsule (2.5 mg total) by mouth daily.   saxagliptin HCl 5 MG Tabs tablet Commonly known as:  ONGLYZA Take 1 tablet (5 mg total) by mouth daily.   simvastatin 20 MG tablet Commonly known as:  ZOCOR Take 1 tablet (20 mg total) by mouth at bedtime.          Objective:   Physical Exam BP 122/62 (BP Location: Left Arm, Patient Position: Sitting, Cuff Size: Small)   Pulse (!) 58   Temp 97.7 F (36.5 C) (Oral)   Resp 14   Ht 5\' 10"  (1.778 m)   Wt 144 lb 6 oz (65.5 kg)   SpO2 97%   BMI 20.72 kg/m  General:   Well developed, well nourished . NAD.  HEENT:  Normocephalic . Face symmetric, atraumatic Skin:  In general looks  dry Fingers, interdigital spaces and wrist: No rash He does have macular and slightly papular  at the upper back,  slightly red, dry, some with scaly borders. See picture from the left arm and back. No Christmas distribution type of pattern Neurologic:  alert & oriented X3.  Speech normal, gait appropriate for age and unassisted Psych--  Cognition and judgment appear intact.  Cooperative with normal attention span and concentration.  Behavior appropriate. No anxious or depressed appearing.        Assessment & Plan:    Assessment> DM --diarrhea with high dose of metformin HTN Hyperlipidemia Hypothyroidism DJD BPH, LUTS,h/o urolithiasis -- sees urology CV: --CAD, CABG 1997, cardiac cath 2009 --Peripheral vascular disease --L leg edema, chronic, (-) DVT per [place] 2015 Gout Anemia: Mild, normal iron 2015 B12 deficiency, mild 2015  Wt loss: 2012 187 lb, 2014 173 lb , 2015 167 H/o Tick fever 2012  PLAN: Eczema?. I notice his skin is very dry, recommend Aveeno and avoid hot showers. Will prescribe a high potency steroid for few days instead of hydrocortisone 2.5%. Call if no better. Dermatology referral?.

## 2016-11-16 NOTE — Assessment & Plan Note (Signed)
Eczema?. I notice his skin is very dry, recommend Aveeno and avoid hot showers. Will prescribe a high potency steroid for few days instead of hydrocortisone 2.5%. Call if no better. Dermatology referral?.

## 2016-11-20 ENCOUNTER — Encounter: Payer: Self-pay | Admitting: Internal Medicine

## 2016-11-20 ENCOUNTER — Ambulatory Visit (INDEPENDENT_AMBULATORY_CARE_PROVIDER_SITE_OTHER): Payer: Medicare Other | Admitting: Internal Medicine

## 2016-11-20 ENCOUNTER — Telehealth: Payer: Self-pay | Admitting: *Deleted

## 2016-11-20 VITALS — BP 120/56 | HR 60 | Ht 70.0 in | Wt 142.4 lb

## 2016-11-20 DIAGNOSIS — R634 Abnormal weight loss: Secondary | ICD-10-CM | POA: Diagnosis not present

## 2016-11-20 DIAGNOSIS — R630 Anorexia: Secondary | ICD-10-CM | POA: Diagnosis not present

## 2016-11-20 DIAGNOSIS — Z86010 Personal history of colonic polyps: Secondary | ICD-10-CM

## 2016-11-20 DIAGNOSIS — R935 Abnormal findings on diagnostic imaging of other abdominal regions, including retroperitoneum: Secondary | ICD-10-CM

## 2016-11-20 MED ORDER — NA SULFATE-K SULFATE-MG SULF 17.5-3.13-1.6 GM/177ML PO SOLN: 1.0000 | Freq: Once | ORAL | 0 refills | Status: AC

## 2016-11-20 NOTE — Telephone Encounter (Signed)
Called patient and left message to return call

## 2016-11-20 NOTE — Patient Instructions (Signed)
Your physician has requested that you go to the basement for the following lab work before leaving today:  bmet  You have been scheduled for a CT scan of the abdomen and pelvis at [facility] ([address]---this is in the same building as Press [facility]).   You are scheduled on 11/28/2016 at 9:30am. You should arrive 15 minutes prior to your appointment time for registration. Please follow the written instructions below on the day of your exam:  WARNING: IF YOU ARE ALLERGIC TO IODINE/X-RAY DYE, PLEASE NOTIFY RADIOLOGY IMMEDIATELY AT [phone]! YOU WILL BE GIVEN A 13 HOUR PREMEDICATION PREP.  1) Do not eat or drink anything after 5:30am (4 hours prior to your test) 2) You have been given 2 bottles of oral contrast to drink. The solution may taste               better if refrigerated, but do NOT add ice or any other liquid to this solution. Shake             well before drinking.    Drink 1 bottle of contrast @ 7:30am (2 hours prior to your exam)  Drink 1 bottle of contrast @ 8:30am (1 hour prior to your exam)  You may take any medications as prescribed with a small amount of water except for the following: Metformin, Glucophage, Glucovance, Avandamet, Riomet, Fortamet, Actoplus Met, Janumet, Glumetza or Metaglip. The above medications must be held the day of the exam AND 48 hours after the exam.  The purpose of you drinking the oral contrast is to aid in the visualization of your intestinal tract. The contrast solution may cause some diarrhea. Before your exam is started, you will be given a small amount of fluid to drink. Depending on your individual set of symptoms, you may also receive an intravenous injection of x-ray contrast/dye. Plan on being at [facility] for 30 minutes or long, depending on the type of exam you are having performed.  If you have any questions regarding your exam or if you need to reschedule, you may call the CT department at [phone]  between the hours of 8:00 am and 5:00 pm, Monday-Friday.  You have been scheduled for an endoscopy and colonoscopy. Please follow the written instructions given to you at your visit today. Please pick up your prep supplies at the pharmacy within the next 1-3 days. If you use inhalers (even only as needed), please bring them with you on the day of your procedure. Your physician has requested that you go to www.startemmi.com and enter the access code given to you at your visit today. This web site gives a general overview about your procedure. However, you should still follow specific instructions given to you by our office regarding your preparation for the procedure.   ________________________________________________________________________

## 2016-11-20 NOTE — Progress Notes (Signed)
HISTORY OF PRESENT ILLNESS:  Ethan Mcneil is a 82 y.o. male, part-time [employer], with multiple medical problems including coronary artery disease with prior coronary artery bypass grafting, diabetes mellitus, hypertension, hypothyroidism, kidney stones, and adenomatous colon polyps. He is sent today by his primary care physician Dr. Belinda Fisher with a chief complaint of unexplained weight loss. Patient reports 35 pound weight loss over the past 18 months. Reviewing his chart, his last between 10 and 15 pounds over the past 2 years. His weight 1 year ago was 147. His weight today is 142. He reports overall decreased appetite and generalized weakness. He denies abdominal pain, dysphagia, change in bowel habits, or GI bleeding. He does have 2 bowel movements per day which are generally formed. They tend to be light-colored and float half the time. Occasional oily appearance. Review of blood work shows that his most recent hemoglobin A1c is elevated 8.5. His last TSH was slightly low. Other laboratories are remarkable for hemoglobin of 11.4 with normal MCV. Otherwise normal CBC. Comprehensive metabolic panel is unremarkable except for albumin 3.1 and total protein 5.1. Noncontrasted hand CT scan from 2015 demonstrates chronically dilated pancreatic duct. He does not feel that his medications are causing anorexia. He did have metformin stopped previously for diarrhea. No reported history of pancreatitis. Patient did undergo colonoscopy August 2012. He was found to have several sessile serrated polyps without dysplasia. As well moderate diverticulosis. Follow-up colonoscopy in 5 years recommended. He did receive a recall letter. He has never smoked and does not use alcohol  REVIEW OF SYSTEMS:  All non-GI ROS negative except for itching  Past Medical History:  Diagnosis Date  . Arthritis   . CAD (coronary artery disease)    CABG 1997; grafts patent @ cath [facility] 11/09  . DM2 (diabetes mellitus, type 2)  ([place])   . Gout    after tick bite  . HLD (hyperlipidemia)   . HTN (hypertension)    essential nos  . Hypothyroid   . Postsurgical aortocoronary bypass status   . PVD (peripheral vascular disease) ([place])   . S/P herniorrhaphy   . Tick fever 2012   [facility] , [place]  . Unspecified hyperplasia of prostate without urinary obstruction and other lower urinary tract symptoms (LUTS)   . Urolithiasis 2015   hx of    Past Surgical History:  Procedure Laterality Date  . CORONARY ARTERY BYPASS GRAFT     x7 vessels 1997; cath 2002 and 2009; grafts patent   . CYSTOSCOPY WITH RETROGRADE PYELOGRAM, URETEROSCOPY AND STENT PLACEMENT Right 02/21/2014   Procedure: CYSTOSCOPY WITH RIGHT  RETROGRADE PYELOGRAM,RIGHT  URETEROSCOPY WITH STONE BASKETING EXTRACTION;  Surgeon: Irine Seal, MD;  Location: [facility];  Service: Urology;  Laterality: Right;  . INGUINAL HERNIA REPAIR    . [place]  reports that he has never smoked. He has never used smokeless tobacco. He reports that he does not drink alcohol or use drugs.  family history includes Coronary artery disease in his mother; Diabetes in his brother; Lung cancer in his father; Prostate cancer in his maternal uncle; Stroke in his other.  Allergies  Allergen Reactions  . Codeine Nausea And Vomiting       PHYSICAL EXAMINATION: Vital signs: BP (!) 120/56   Pulse 60   Ht 5\' 10"  (1.778 m)   Wt 142 lb 6 oz (64.6 kg)   BMI 20.43 kg/m   Constitutional:Thin but otherwise  generally well-appearing, no acute distress Psychiatric: alert and oriented x3, cooperative Head: Slight temporal wasting bilaterally Eyes: extraocular movements intact, anicteric, conjunctiva pink.. No thrush Mouth: oral pharynx moist, no lesions Neck: supple without thyromegaly Lymph: no lymphadenopathy Cardiovascular: heart regular rate and rhythm, no murmur Lungs: clear to auscultation bilaterally Abdomen: soft, nontender,  nondistended, no obvious ascites, no peritoneal signs, normal bowel sounds, no organomegaly Rectal: Deferred until colonoscopy Extremities: no clubbing or cyanosis. 1+ lower extremity edema bilaterally Skin: no lesions on visible extremities Neuro: No focal deficits. Cranial nerves intact. No asterixis.  ASSESSMENT:  #1. Weight loss. In part due to decreased appetite. Somewhat stable over recent months. Low protein and albumin may suggest malabsorption or protein losing enteropathy. As well, reports of what sounds like mild steatorrhea. Prior CT with dilated pancreatic duct of uncertain clinical significance #2. Abnormal CT with dilated pancreatic duct. Rule out chronic pancreatitis. Rule out neoplasm #3. History of sessile serrated polyps. Due for surveillance colonoscopy #4. Multiple medical problems. Including diabetes mellitus with poor control   PLAN:  #1. Colonoscopy to provide surveillance as he is due.The nature of the procedure, as well as the risks, benefits, and alternatives were carefully and thoroughly reviewed with the patient. Ample time for discussion and questions allowed. The patient understood, was satisfied, and agreed to proceed. #2. Upper endoscopy to evaluate weight loss.The nature of the procedure, as well as the risks, benefits, and alternatives were carefully and thoroughly reviewed with the patient. Ample time for discussion and questions allowed. The patient understood, was satisfied, and agreed to proceed. #3. Contrast-enhanced CT scan of the abdomen and pelvis to evaluate weight loss as well as abnormally dilated pancreatic duct on prior noncontrast imaging. #4. Hold diabetic medications for his procedure to avoid hypoglycemia #5. Better diabetic control important #6. Consider trial of pancreatic enzymes #7. Ongoing general medical care with Dr. Larose Kells A copy of this consultation note has been sent to Dr. Larose Kells

## 2016-11-22 ENCOUNTER — Telehealth: Payer: Self-pay | Admitting: Internal Medicine

## 2016-11-22 NOTE — Telephone Encounter (Signed)
Spoke with Mr. Kutzer wife. She stated that Mr. Buch will give office a call back to schedule annual wellness appt.

## 2016-11-27 ENCOUNTER — Other Ambulatory Visit (INDEPENDENT_AMBULATORY_CARE_PROVIDER_SITE_OTHER): Payer: Medicare Other

## 2016-11-27 DIAGNOSIS — R634 Abnormal weight loss: Secondary | ICD-10-CM | POA: Diagnosis not present

## 2016-11-27 DIAGNOSIS — Z86010 Personal history of colonic polyps: Secondary | ICD-10-CM

## 2016-11-27 DIAGNOSIS — R630 Anorexia: Secondary | ICD-10-CM | POA: Diagnosis not present

## 2016-11-27 LAB — BASIC METABOLIC PANEL
BUN: 11 mg/dL (ref 6–23)
CO2: 27 mEq/L (ref 19–32)
Calcium: 8.2 mg/dL — ABNORMAL LOW (ref 8.4–10.5)
Chloride: 108 mEq/L (ref 96–112)
Creatinine, Ser: 1.42 mg/dL (ref 0.40–1.50)
GFR: 50.94 mL/min — ABNORMAL LOW (ref >60.00)
Glucose, Bld: 116 mg/dL — ABNORMAL HIGH (ref 70–99)
Potassium: 4.3 mEq/L (ref 3.5–5.1)
Sodium: 141 mEq/L (ref 135–145)

## 2016-11-28 ENCOUNTER — Ambulatory Visit (INDEPENDENT_AMBULATORY_CARE_PROVIDER_SITE_OTHER)
Admission: RE | Admit: 2016-11-28 | Discharge: 2016-11-28 | Disposition: A | Discharge status: Home / Self Care | Payer: Medicare Other | Source: Ambulatory Visit | Attending: Internal Medicine | Admitting: Internal Medicine

## 2016-11-28 DIAGNOSIS — Z860100 Personal history of colon polyps, unspecified: Secondary | ICD-10-CM

## 2016-11-28 DIAGNOSIS — R634 Abnormal weight loss: Secondary | ICD-10-CM | POA: Diagnosis not present

## 2016-11-28 DIAGNOSIS — R630 Anorexia: Secondary | ICD-10-CM

## 2016-11-28 MED ORDER — IOPAMIDOL (ISOVUE-300) INJECTION 61%
80.0000 mL | Freq: Once | INTRAVENOUS | Status: AC | PRN
  Administered 2016-11-28: 80 mL via INTRAVENOUS

## 2016-12-07 ENCOUNTER — Other Ambulatory Visit: Payer: Self-pay | Admitting: Internal Medicine

## 2017-01-06 ENCOUNTER — Encounter: Payer: Self-pay | Admitting: Internal Medicine

## 2017-01-06 ENCOUNTER — Ambulatory Visit (INDEPENDENT_AMBULATORY_CARE_PROVIDER_SITE_OTHER): Payer: Medicare Other | Admitting: Internal Medicine

## 2017-01-06 VITALS — BP 122/62 | HR 52 | Temp 98.2°F | Resp 14 | Ht 70.0 in | Wt 141.1 lb

## 2017-01-06 DIAGNOSIS — R634 Abnormal weight loss: Secondary | ICD-10-CM

## 2017-01-06 DIAGNOSIS — E1165 Type 2 diabetes mellitus with hyperglycemia: Secondary | ICD-10-CM

## 2017-01-06 DIAGNOSIS — IMO0002 Reserved for concepts with insufficient information to code with codable children: Secondary | ICD-10-CM

## 2017-01-06 DIAGNOSIS — I10 Essential (primary) hypertension: Secondary | ICD-10-CM

## 2017-01-06 DIAGNOSIS — E1151 Type 2 diabetes mellitus with diabetic peripheral angiopathy without gangrene: Secondary | ICD-10-CM

## 2017-01-06 DIAGNOSIS — D649 Anemia, unspecified: Secondary | ICD-10-CM | POA: Diagnosis not present

## 2017-01-06 DIAGNOSIS — L309 Dermatitis, unspecified: Secondary | ICD-10-CM

## 2017-01-06 DIAGNOSIS — E038 Other specified hypothyroidism: Secondary | ICD-10-CM | POA: Diagnosis not present

## 2017-01-06 LAB — IRON: Iron: 47 ug/dL (ref 42–165)

## 2017-01-06 LAB — CBC WITH DIFFERENTIAL/PLATELET
Basophils Absolute: 0.1 10*3/uL (ref 0.0–0.1)
Basophils Relative: 0.9 % (ref 0.0–3.0)
Eosinophils Absolute: 0.7 10*3/uL (ref 0.0–0.7)
Eosinophils Relative: 10.9 % — ABNORMAL HIGH (ref 0.0–5.0)
HCT: 34.1 % — ABNORMAL LOW (ref 39.0–52.0)
Hemoglobin: 11.5 g/dL — ABNORMAL LOW (ref 13.0–17.0)
Lymphocytes Relative: 25.9 % (ref 12.0–46.0)
Lymphs Abs: 1.7 10*3/uL (ref 0.7–4.0)
MCHC: 33.7 g/dL (ref 30.0–36.0)
MCV: 87.9 fl (ref 78.0–100.0)
Monocytes Absolute: 0.5 10*3/uL (ref 0.1–1.0)
Monocytes Relative: 7.8 % (ref 3.0–12.0)
Neutro Abs: 3.7 10*3/uL (ref 1.4–7.7)
Neutrophils Relative %: 54.5 % (ref 43.0–77.0)
Platelets: 184 10*3/uL (ref 150.0–400.0)
RBC: 3.89 Mil/uL — ABNORMAL LOW (ref 4.22–5.81)
RDW: 14.3 % (ref 11.5–15.5)
WBC: 6.7 10*3/uL (ref 4.0–10.5)

## 2017-01-06 LAB — TSH: TSH: 0.82 u[IU]/mL (ref 0.35–4.50)

## 2017-01-06 LAB — FERRITIN: Ferritin: 17 ng/mL — ABNORMAL LOW (ref 22.0–322.0)

## 2017-01-06 LAB — HEMOGLOBIN A1C: Hgb A1c MFr Bld: 9.7 % — ABNORMAL HIGH (ref 4.6–6.5)

## 2017-01-06 NOTE — Progress Notes (Signed)
Pre visit review using our clinic review tool, if applicable. No additional management support is needed unless otherwise documented below in the visit note. 

## 2017-01-06 NOTE — Progress Notes (Signed)
Subjective:    Patient ID: Ethan Mcneil, male    DOB: [birth year], 83 y.o.   MRN: [id number]  DOS:  01/06/2017 Type of visit - description : rov Interval history: DM: Good med compliance , ambulatory blood sugars infrequently check, this morning 136 HTN: Good med compliance, no ambulatory BPs Weight loss: GI note and  CT results reviewed. Rash, see last OV: Improved, he has on and off similar rashes a different sides with good response to Aveeno OTC   Wt Readings from Last 3 Encounters:  01/06/17 141 lb 2 oz (64 kg)  11/20/16 142 lb 6 oz (64.6 kg)  11/14/16 144 lb 6 oz (65.5 kg)     Review of Systems Reports good appetite and by mouth intake No chest pain or difficulty breathing No nausea, vomiting, diarrhea. No Anxiety depression  Past Medical History:  Diagnosis Date  . Arthritis   . CAD (coronary artery disease)    CABG 1997; grafts patent @ cath [facility] 11/09  . DM2 (diabetes mellitus, type 2) ([place])   . Gout    after tick bite  . HLD (hyperlipidemia)   . HTN (hypertension)    essential nos  . Hypothyroid   . Postsurgical aortocoronary bypass status   . PVD (peripheral vascular disease) ([place])   . S/P herniorrhaphy   . Tick fever 2012   [facility] , [place]  . Unspecified hyperplasia of prostate without urinary obstruction and other lower urinary tract symptoms (LUTS)   . Urolithiasis 2015   hx of    Past Surgical History:  Procedure Laterality Date  . CORONARY ARTERY BYPASS GRAFT     x7 vessels 1997; cath 2002 and 2009; grafts patent   . CYSTOSCOPY WITH RETROGRADE PYELOGRAM, URETEROSCOPY AND STENT PLACEMENT Right 02/21/2014   Procedure: CYSTOSCOPY WITH RIGHT  RETROGRADE PYELOGRAM,RIGHT  URETEROSCOPY WITH STONE BASKETING EXTRACTION;  Surgeon: Irine Seal, MD;  Location: [facility];  Service: Urology;  Laterality: Right;  . INGUINAL HERNIA REPAIR    . SHOULDER SURGERY      Social History   Social History  . Marital status: Married    Spouse name: [name]    . Number of children: 4  . Years of education: N/A   Occupational History  . retired, works few hours     Social History Main Topics  . Smoking status: Never Smoker  . Smokeless tobacco: Never Used  . Alcohol use No  . Drug use: No  . Sexual activity: Not on file   Other Topics Concern  . Not on file   Social History Narrative   Buyer, retail and former race car driver   4 children (boys)   Designated party form signed appointing wife - [name]; ok to leave msg on home phone [phone]               Allergies as of 01/06/2017      Reactions   Codeine Nausea And Vomiting      Medication List       Accurate as of 01/06/17  5:50 PM. Always use your most recent med list.          aspirin EC 81 MG tablet Take 81 mg by mouth daily.   augmented betamethasone dipropionate 0.05 % cream Commonly known as:  DIPROLENE-AF   glimepiride 4 MG tablet Commonly known as:  AMARYL Take 1.5 tablets (6 mg total) by mouth daily with breakfast.   glucose blood test strip Commonly known as:  FREESTYLE TEST STRIPS Check blood sugar no more than twice daily.   levothyroxine 125 MCG tablet Commonly known as:  SYNTHROID, LEVOTHROID Take 1 tablet (125 mcg total) by mouth daily before breakfast.   metoprolol succinate 25 MG 24 hr tablet Commonly known as:  TOPROL-XL Take 0.5 tablets (12.5 mg total) by mouth daily.   ramipril 2.5 MG capsule Commonly known as:  ALTACE Take 1 capsule (2.5 mg total) by mouth daily.   saxagliptin HCl 5 MG Tabs tablet Commonly known as:  ONGLYZA Take 1 tablet (5 mg total) by mouth daily.   simvastatin 20 MG tablet Commonly known as:  ZOCOR Take 1 tablet (20 mg total) by mouth at bedtime.          Objective:   Physical Exam BP 122/62 (BP Location: Left Arm, Patient Position: Sitting, Cuff Size: Small)   Pulse (!) 52   Temp 98.2 F (36.8 C) (Oral)   Resp 14   Ht 5\' 10"  (1.778 m)   Wt 141 lb 2 oz (64 kg)   SpO2 98%   BMI 20.25  kg/m  General:   Well developed, well nourished . NAD.  HEENT:  Normocephalic . Face symmetric, atraumatic Lungs:  CTA B Normal respiratory effort, no intercostal retractions, no accessory muscle use. Heart: RRR,  no murmur.  + Soft pitting peri ankle  edema, worse on the left  Skin: Not pale. Not jaundice Neurologic:  alert & oriented X3.  Speech normal, gait appropriate for age and unassisted Psych--  Cognition and judgment appear intact.  Cooperative with normal attention span and concentration.  Behavior appropriate. No anxious or depressed appearing.      Assessment & Plan:  Assessment> DM -- w/ CAD, no neuropathy, intolerant to metformin  HTN Hyperlipidemia Hypothyroidism DJD BPH, LUTS,h/o urolithiasis -- sees urology CV: --CAD, CABG 1997, cardiac cath 2009 --Peripheral vascular disease --L leg edema, chronic, (-) DVT per [place] 2015 Gout Anemia: Mild, normal iron 2015 B12 deficiency, mild 2015  Wt loss: 2012 187 lb, 2014 173 lb , 2015 167 H/o Tick fever 2012  PLAN: DM: Continue glimepiride and Onglyza. Check A1c. Feet exam normal HTN: Seems controlled on Altace, Toprol. Hypothyroidism: On Synthroid: Check a TSH Weight loss: Saw GI 11-2016, CT abdomen show no acute findings, + chronic pancreatic changes. Pending  a cscope and EGD in few weeks. Weight is relatively stable. Eczema: Rash is behaving as eczema ( on and off, at different places, decrease with Aveeno). Mild anemia noted, rechecking a CBC, iron and ferritin. RTC 4 months, CPX

## 2017-01-06 NOTE — Patient Instructions (Addendum)
GO TO THE LAB : Get the blood work     GO TO THE FRONT DESK Schedule your next appointment for a  complete physical exam in 4 months. Fasting

## 2017-01-06 NOTE — Assessment & Plan Note (Signed)
DM: Continue glimepiride and Onglyza. Check A1c. Feet exam normal HTN: Seems controlled on Altace, Toprol. Hypothyroidism: On Synthroid: Check a TSH Weight loss: Saw GI 11-2016, CT abdomen show no acute findings, + chronic pancreatic changes. Pending  a cscope and EGD in few weeks. Weight is relatively stable. Eczema: Rash is behaving as eczema ( on and off, at different places, decrease with Aveeno). Mild anemia noted, rechecking a CBC, iron and ferritin. RTC 4 months, CPX

## 2017-01-20 ENCOUNTER — Telehealth: Payer: Self-pay | Admitting: Internal Medicine

## 2017-01-20 NOTE — Telephone Encounter (Signed)
Patient scheduled medicare wellness appointment for 05/15/2017 with PCP

## 2017-01-23 ENCOUNTER — Encounter: Payer: Self-pay | Admitting: Internal Medicine

## 2017-01-23 ENCOUNTER — Ambulatory Visit (AMBULATORY_SURGERY_CENTER): Payer: Medicare Other | Admitting: Internal Medicine

## 2017-01-23 VITALS — BP 116/55 | HR 52 | Temp 96.4°F | Resp 19 | Ht 70.0 in | Wt 142.0 lb

## 2017-01-23 DIAGNOSIS — R634 Abnormal weight loss: Secondary | ICD-10-CM

## 2017-01-23 DIAGNOSIS — Z86010 Personal history of colonic polyps: Secondary | ICD-10-CM | POA: Diagnosis not present

## 2017-01-23 DIAGNOSIS — I739 Peripheral vascular disease, unspecified: Secondary | ICD-10-CM | POA: Diagnosis not present

## 2017-01-23 DIAGNOSIS — K2990 Gastroduodenitis, unspecified, without bleeding: Secondary | ICD-10-CM

## 2017-01-23 DIAGNOSIS — E119 Type 2 diabetes mellitus without complications: Secondary | ICD-10-CM | POA: Diagnosis not present

## 2017-01-23 DIAGNOSIS — K2970 Gastritis, unspecified, without bleeding: Secondary | ICD-10-CM

## 2017-01-23 DIAGNOSIS — I2510 Atherosclerotic heart disease of native coronary artery without angina pectoris: Secondary | ICD-10-CM | POA: Diagnosis not present

## 2017-01-23 DIAGNOSIS — K2960 Other gastritis without bleeding: Secondary | ICD-10-CM | POA: Diagnosis not present

## 2017-01-23 DIAGNOSIS — I10 Essential (primary) hypertension: Secondary | ICD-10-CM | POA: Diagnosis not present

## 2017-01-23 MED ORDER — PANCRELIPASE (LIP-PROT-AMYL) 25000 UNITS PO CPEP: ORAL_CAPSULE | ORAL | 11 refills | Status: DC

## 2017-01-23 MED ORDER — SODIUM CHLORIDE 0.9 % IV SOLN: 500.0000 mL | INTRAVENOUS | Status: DC

## 2017-01-23 NOTE — Op Note (Signed)
[facility] Patient Name: Ethan Mcneil Procedure Date: 01/23/2017 10:32 AM MRN: [id number] Endoscopist: Docia Chuck. Henrene Pastor , MD Age: 83 Referring MD:  Date of Birth: February 02, 1934 Gender: Male Account #: [id number] Procedure:                Upper GI endoscopy, with biopsies Indications:              Weight loss Medicines:                Monitored Anesthesia Care Procedure:                Pre-Anesthesia Assessment:                           - Prior to the procedure, a History and Physical                            was performed, and patient medications and                            allergies were reviewed. The patient's tolerance of                            previous anesthesia was also reviewed. The risks                            and benefits of the procedure and the sedation                            options and risks were discussed with the patient.                            All questions were answered, and informed consent                            was obtained. Prior Anticoagulants: The patient has                            taken no previous anticoagulant or antiplatelet                            agents. ASA Grade Assessment: II - A patient with                            mild systemic disease. After reviewing the risks                            and benefits, the patient was deemed in                            satisfactory condition to undergo the procedure.                           After obtaining informed consent, the endoscope was  passed under direct vision. Throughout the                            procedure, the patient's blood pressure, pulse, and                            oxygen saturations were monitored continuously. The                            Model GIF-HQ190 [id number]) scope was introduced                            through the mouth, and advanced to the second part                            of duodenum. The upper GI  endoscopy was                            accomplished without difficulty. The patient                            tolerated the procedure well. Scope In: Scope Out: Findings:                 The esophagus was normal.                           Nonspecific and slightly Nodular mucosa was found                            in the proximal stomach. Biopsies were taken with a                            cold forceps for histology.                           The stomach was otherwise normal.                           The examined duodenum was normal.                           The cardia and gastric fundus were normal on                            retroflexion. Complications:            No immediate complications. Estimated Blood Loss:     Estimated blood loss: none. Impression:               - Normal esophagus.                           - Nodular mucosa in the proximal stomach. Biopsied.                           - Normal stomach.                           -  Normal examined duodenum. Recommendation:           - Please see recommendations on colonoscopy report.                           - Resume previous diet.                           - Continue present medications.                           - Await pathology results. Docia Chuck. Henrene Pastor, MD 01/23/2017 11:26:11 AM This report has been signed electronically.

## 2017-01-23 NOTE — Progress Notes (Signed)
Called to room to assist during endoscopic procedure.  Patient ID and intended procedure confirmed with present staff. Received instructions for my participation in the procedure from the performing physician.  

## 2017-01-23 NOTE — Patient Instructions (Signed)
YOU HAD AN ENDOSCOPIC PROCEDURE TODAY AT [facility]:   Refer to the procedure report that was given to you for any specific questions about what was found during the examination.  If the procedure report does not answer your questions, please call your gastroenterologist to clarify.  If you requested that your care partner not be given the details of your procedure findings, then the procedure report has been included in a sealed envelope for you to review at your convenience later.  YOU SHOULD EXPECT: Some feelings of bloating in the abdomen. Passage of more gas than usual.  Walking can help get rid of the air that was put into your GI tract during the procedure and reduce the bloating. If you had a lower endoscopy (such as a colonoscopy or flexible sigmoidoscopy) you may notice spotting of blood in your stool or on the toilet paper. If you underwent a bowel prep for your procedure, you may not have a normal bowel movement for a few days.  Please Note:  You might notice some irritation and congestion in your nose or some drainage.  This is from the oxygen used during your procedure.  There is no need for concern and it should clear up in a day or so.  SYMPTOMS TO REPORT IMMEDIATELY:   Following lower endoscopy (colonoscopy or flexible sigmoidoscopy):  Excessive amounts of blood in the stool  Significant tenderness or worsening of abdominal pains  Swelling of the abdomen that is new, acute  Fever of 100F or higher   Following upper endoscopy (EGD)  Vomiting of blood or coffee ground material  New chest pain or pain under the shoulder blades  Painful or persistently difficult swallowing  New shortness of breath  Fever of 100F or higher  Black, tarry-looking stools  For urgent or emergent issues, a gastroenterologist can be reached at any hour by calling [phone].   DIET:  We do recommend a small meal at first, but then you may proceed to your regular diet.  Drink  plenty of fluids but you should avoid alcoholic beverages for 24 hours.  ACTIVITY:  You should plan to take it easy for the rest of today and you should NOT DRIVE or use heavy machinery until tomorrow (because of the sedation medicines used during the test).    FOLLOW UP: Our staff will call the number listed on your records the next business day following your procedure to check on you and address any questions or concerns that you may have regarding the information given to you following your procedure. If we do not reach you, we will leave a message.  However, if you are feeling well and you are not experiencing any problems, there is no need to return our call.  We will assume that you have returned to your regular daily activities without incident.  If any biopsies were taken you will be contacted by phone or by letter within the next 1-3 weeks.  Please call us at [phone] if you have not heard about the biopsies in 3 weeks.   AWAIT FOR BIOPSY RESULTS Repeat Colonoscopy not recommended Hemorrhoids (handoit given)Office will call you for a follow up appointment in 6 weeks Zenpep take 2  With meals and snacks    SIGNATURES/CONFIDENTIALITY: You and/or your care partner have signed paperwork which will be entered into your electronic medical record.  These signatures attest to the fact that that the information above on your After Visit Summary has been  reviewed and is understood.  Full responsibility of the confidentiality of this discharge information lies with you and/or your care-partner. 

## 2017-01-23 NOTE — Op Note (Signed)
[facility] Patient Name: Ethan Mcneil Procedure Date: 01/23/2017 10:33 AM MRN: [id number] Endoscopist: Docia Chuck. Henrene Pastor , MD Age: 83 Referring MD:  Date of Birth: 1934-08-11 Gender: Male Account #: [id number] Procedure:                Colonoscopy Indications:              High risk colon cancer surveillance: Personal                            history of sessile serrated colon polyp (less than                            10 mm in size) with no dysplasia, Incidental -                            Weight loss Medicines:                Monitored Anesthesia Care Procedure:                Pre-Anesthesia Assessment:                           - Prior to the procedure, a History and Physical                            was performed, and patient medications and                            allergies were reviewed. The patient's tolerance of                            previous anesthesia was also reviewed. The risks                            and benefits of the procedure and the sedation                            options and risks were discussed with the patient.                            All questions were answered, and informed consent                            was obtained. Prior Anticoagulants: The patient has                            taken no previous anticoagulant or antiplatelet                            agents. ASA Grade Assessment: II - A patient with                            mild systemic disease. After reviewing the risks  and benefits, the patient was deemed in                            satisfactory condition to undergo the procedure.                           After obtaining informed consent, the colonoscope                            was passed under direct vision. Throughout the                            procedure, the patient's blood pressure, pulse, and                            oxygen saturations were monitored continuously. The                       Model CF-HQ190L [id number]) scope was introduced                            through the anus and advanced to the the cecum,                            identified by appendiceal orifice and ileocecal                            valve. The ileocecal valve, appendiceal orifice,                            and rectum were photographed. The quality of the                            bowel preparation was excellent. The colonoscopy                            was performed without difficulty. The patient                            tolerated the procedure well. The bowel preparation                            used was SUPREP. Scope In: 10:44:14 AM Scope Out: 10:56:37 AM Scope Withdrawal Time: 0 hours 7 minutes 56 seconds  Total Procedure Duration: 0 hours 12 minutes 23 seconds  Findings:                 Internal hemorrhoids were found during retroflexion.                           The exam was otherwise without abnormality on                            direct and retroflexion views. Complications:            No immediate complications. Estimated blood  loss:                            None. Estimated Blood Loss:     Estimated blood loss: none. Impression:               - Internal hemorrhoids.                           - The examination was otherwise normal on direct                            and retroflexion views.                           - No specimens collected. Recommendation:           - Repeat colonoscopy is not recommended for                            surveillance.                           - Prescribe Zenpep (lipase 25,000 units); #120;                            take 2 with meals and snacks. ;11 refills                           - Resume previous diet.                           - Continue present medications.                           - EGD today. Please see results                           - Office follow-up with Dr. Henrene Pastor in 6 weeks. Docia Chuck. Henrene Pastor, MD 01/23/2017  11:20:16 AM This report has been signed electronically.

## 2017-01-23 NOTE — Progress Notes (Signed)
Report to PACU, RN, vss, BBS= Clear.  

## 2017-01-24 ENCOUNTER — Telehealth: Payer: Self-pay | Admitting: *Deleted

## 2017-01-24 NOTE — Telephone Encounter (Signed)
  Follow up Call-  Call back number 01/23/2017  Post procedure Call Back phone  # #[phone] hm  Permission to leave phone message Yes  Some recent data might be hidden     Patient questions:  Do you have a fever, pain , or abdominal swelling? No. Pain Score  0 *  Have you tolerated food without any problems? Yes.    Have you been able to return to your normal activities? Yes.    Do you have any questions about your discharge instructions: Diet   No. Medications  No. Follow up visit  No.  Do you have questions or concerns about your Care? No.  Actions: * If pain score is 4 or above: No action needed, pain <4.

## 2017-02-03 ENCOUNTER — Encounter: Payer: Self-pay | Admitting: Internal Medicine

## 2017-02-18 ENCOUNTER — Other Ambulatory Visit: Payer: Self-pay | Admitting: Internal Medicine

## 2017-03-03 ENCOUNTER — Other Ambulatory Visit: Payer: Self-pay | Admitting: Internal Medicine

## 2017-03-10 ENCOUNTER — Telehealth: Payer: Self-pay

## 2017-03-10 ENCOUNTER — Other Ambulatory Visit: Payer: Self-pay

## 2017-03-10 ENCOUNTER — Ambulatory Visit (INDEPENDENT_AMBULATORY_CARE_PROVIDER_SITE_OTHER): Payer: Medicare Other | Admitting: Internal Medicine

## 2017-03-10 ENCOUNTER — Telehealth: Payer: Self-pay | Admitting: Internal Medicine

## 2017-03-10 ENCOUNTER — Encounter: Payer: Self-pay | Admitting: Internal Medicine

## 2017-03-10 VITALS — BP 116/50 | HR 76 | Ht 69.5 in | Wt 137.4 lb

## 2017-03-10 DIAGNOSIS — R634 Abnormal weight loss: Secondary | ICD-10-CM | POA: Diagnosis not present

## 2017-03-10 DIAGNOSIS — K8689 Other specified diseases of pancreas: Secondary | ICD-10-CM

## 2017-03-10 DIAGNOSIS — R935 Abnormal findings on diagnostic imaging of other abdominal regions, including retroperitoneum: Secondary | ICD-10-CM | POA: Diagnosis not present

## 2017-03-10 MED ORDER — PANCRELIPASE (LIP-PROT-AMYL) 25000 UNITS PO CPEP: ORAL_CAPSULE | ORAL | 3 refills | Status: DC

## 2017-03-10 MED ORDER — PANCRELIPASE (LIP-PROT-AMYL) 25000 UNITS PO CPEP: ORAL_CAPSULE | ORAL | 0 refills | Status: DC

## 2017-03-10 NOTE — Progress Notes (Signed)
HISTORY OF PRESENT ILLNESS:  Ethan Mcneil is a 83 y.o. male , part-time [employer], with multiple medical problems who was evaluated 11/20/2016 regarding weight loss. See that dictation for details. At that time he reported what sounded like steatorrhea. His diabetes was not under good control. Previous CT with dilated pancreatic duct. History of sessile serrated polyps for which she was due surveillance. Repeat CT showed chronic atrophy of the pancreas with diffuse ductal dilation. No mass. No acute findings. Small low-attenuation lesions of the liver which could not be characterized for which follow-up was recommended. Colonoscopy and upper endoscopy were also performed. Colonoscopy was normal except for internal hemorrhoids. Upper endoscopy revealed somewhat irregular gastric mucosa for which multiple biopsies taken. These are said to show chronic active gastritis. He was placed on pancreatic enzyme supplements and asked to follow-up at this time. Patient reports that the pancreatic enzyme supplements have resulted in dramatic improvement in his bowel habits. Currently with one formed bowel movement per day without features of steatorrhea. He decreased Zen-pep from 2 with meals to one with meals secondary to cost. One with meals was just as effective. He has lost several pounds since his last visit. No new complaints. He does report poor control of his diabetes. He has not seen Dr. Larose Kells since his evaluation with me.  REVIEW OF SYSTEMS:  All non-GI ROS negative except for fatigue and itching  Past Medical History:  Diagnosis Date  . Arthritis   . CAD (coronary artery disease)    CABG 1997; grafts patent @ cath [facility] 11/09  . Chronic kidney disease   . DM2 (diabetes mellitus, type 2) ([place])   . Gout    after tick bite  . HLD (hyperlipidemia)   . HTN (hypertension)    essential nos  . Hypothyroid   . Postsurgical aortocoronary bypass status   . PVD (peripheral vascular disease) ([place])   .  S/P herniorrhaphy   . Tick fever 2012   [facility] [place]  . Unspecified hyperplasia of prostate without urinary obstruction and other lower urinary tract symptoms (LUTS)   . Urolithiasis 2015   hx of    Past Surgical History:  Procedure Laterality Date  . COLONOSCOPY    . CORONARY ARTERY BYPASS GRAFT     x7 vessels 1997; cath 2002 and 2009; grafts patent   . CYSTOSCOPY WITH RETROGRADE PYELOGRAM, URETEROSCOPY AND STENT PLACEMENT Right 02/21/2014   Procedure: CYSTOSCOPY WITH RIGHT  RETROGRADE PYELOGRAM,RIGHT  URETEROSCOPY WITH STONE BASKETING EXTRACTION;  Surgeon: Irine Seal, MD;  Location: [facility];  Service: Urology;  Laterality: Right;  . INGUINAL HERNIA REPAIR    . [place]  reports that he has never smoked. He has never used smokeless tobacco. He reports that he does not drink alcohol or use drugs.  family history includes Coronary artery disease in his mother; Diabetes in his brother; Lung cancer in his father; Prostate cancer in his maternal uncle; Stroke in his son; Throat cancer in his son.  Allergies  Allergen Reactions  . Codeine Nausea And Vomiting       PHYSICAL EXAMINATION: Vital signs: BP (!) 116/50   Pulse 76   Ht 5' 9.5" (1.765 m) Comment: w/o shoes  Wt 137 lb 6 oz (62.3 kg)   BMI 20.00 kg/m   Constitutional:Thin but otherwise generally well-appearing, no acute distress Psychiatric: alert and oriented x3, cooperative Eyes: extraocular movements intact, anicteric, conjunctiva pink Mouth:  oral pharynx moist, no lesions Neck: supple no lymphadenopathy Cardiovascular: heart regular rate and rhythm, no murmur Lungs: clear to auscultation bilaterally Abdomen: soft, nontender, nondistended, no obvious ascites, no peritoneal signs, normal bowel sounds, no organomegaly Rectal: Normal at the time of recent colonoscopy Extremities: no clubbing cyanosis or lower extremity edema bilaterally Skin: no lesions on  visible extremities Neuro: No focal deficits. Normal DTRs  ASSESSMENT:  #1. Weight loss. I suspect this is a combination of pancreatic insufficiency and poor control of his diabetes. Unremarkable colonoscopy and upper endoscopy. #2. Pancreatic insufficiency. Steatorrhea by history. Atrophic pancreas on CT. Symptoms improved with pancreatic enzyme placement therapy #3. Diminutive indeterminate liver lesions on CT to be followed up in 6 months from last exam. He is aware #4. Multiple medical problems   PLAN:  #1. Continue pancreatic enzyme replacement therapy #2. Return to Dr. Larose Kells to address poorly controlled diabetes #3. Follow-up CT around June reevaluate liver lesions for stability #4. Return to general medical care Dr. Larose Kells. GI follow-up as needed.  25 minutes were spent face-to-face with the patient. Greater than 50% a time use for counseling regarding the findings on his procedures, pathology, imaging abnormalities. Also to discuss pancreatic insufficiency and its treatment. Finally the importance of good diabetic control.

## 2017-03-10 NOTE — Telephone Encounter (Signed)
Called in zenpep so patient could take advantage of 200 free tablet voucher our rep can use.  Per his instructions I sent in 810 with 3 refills so patient could take advantage of potential savings with upcoming refills

## 2017-03-10 NOTE — Telephone Encounter (Signed)
Left message on vm that 200 free zenpep would be available to pick up from [facility] tomorrow after 5.

## 2017-03-10 NOTE — Telephone Encounter (Signed)
Note from GI reviewed, weight loss is felt to be due to diabetes, his last A1c was more than 9, he felt to return to the office for further treatment. Please arrange office visit at this office within the next 10 days

## 2017-03-10 NOTE — Patient Instructions (Signed)
We have sent the following medications to your pharmacy for you to pick up at your convenience:  Zenpep  Please follow up as needed

## 2017-03-11 NOTE — Telephone Encounter (Signed)
Shiquita-can you call Pt to schedule appt for follow-up. Needs to be seen within next 10 days. Thank you.

## 2017-03-11 NOTE — Telephone Encounter (Signed)
lvm for pt to call back to schedule appt with PCP.

## 2017-03-12 NOTE — Telephone Encounter (Signed)
Pt has been scheduled.  °

## 2017-03-17 ENCOUNTER — Ambulatory Visit (INDEPENDENT_AMBULATORY_CARE_PROVIDER_SITE_OTHER): Payer: Medicare Other | Admitting: Internal Medicine

## 2017-03-17 ENCOUNTER — Encounter: Payer: Self-pay | Admitting: Internal Medicine

## 2017-03-17 VITALS — BP 126/78 | HR 57 | Temp 97.9°F | Resp 14 | Ht 69.5 in | Wt 138.0 lb

## 2017-03-17 DIAGNOSIS — Z09 Encounter for follow-up examination after completed treatment for conditions other than malignant neoplasm: Secondary | ICD-10-CM | POA: Diagnosis not present

## 2017-03-17 DIAGNOSIS — E1151 Type 2 diabetes mellitus with diabetic peripheral angiopathy without gangrene: Secondary | ICD-10-CM

## 2017-03-17 DIAGNOSIS — K8689 Other specified diseases of pancreas: Secondary | ICD-10-CM | POA: Diagnosis not present

## 2017-03-17 DIAGNOSIS — E1165 Type 2 diabetes mellitus with hyperglycemia: Secondary | ICD-10-CM | POA: Diagnosis not present

## 2017-03-17 DIAGNOSIS — IMO0002 Reserved for concepts with insufficient information to code with codable children: Secondary | ICD-10-CM

## 2017-03-17 LAB — BASIC METABOLIC PANEL
BUN: 22 mg/dL (ref 6–23)
CO2: 29 mEq/L (ref 19–32)
Calcium: 8.6 mg/dL (ref 8.4–10.5)
Chloride: 98 mEq/L (ref 96–112)
Creatinine, Ser: 1.27 mg/dL (ref 0.40–1.50)
GFR: 57.9 mL/min — ABNORMAL LOW (ref >60.00)
Glucose, Bld: 431 mg/dL — ABNORMAL HIGH (ref 70–99)
Potassium: 4.3 mEq/L (ref 3.5–5.1)
Sodium: 131 mEq/L — ABNORMAL LOW (ref 135–145)

## 2017-03-17 LAB — HEMOGLOBIN A1C: Hgb A1c MFr Bld: 13.2 % — ABNORMAL HIGH (ref 4.6–6.5)

## 2017-03-17 MED ORDER — INSULIN PEN NEEDLE 31G X 5 MM MISC: 5 refills | Status: DC

## 2017-03-17 MED ORDER — BASAGLAR KWIKPEN 100 UNIT/ML ~~LOC~~ SOPN: PEN_INJECTOR | SUBCUTANEOUS | 2 refills | Status: DC

## 2017-03-17 NOTE — Progress Notes (Signed)
Pre visit review using our clinic review tool, if applicable. No additional management support is needed unless otherwise documented below in the visit note. 

## 2017-03-17 NOTE — Assessment & Plan Note (Signed)
Weight loss: Since the last time he was here, had a EGD, colonoscopy. Was prescribed pancreatic enzymes for the diagnosis of pancreatic insufficiency. Since then, diarrhea is resolved, CBGs have increase. DM: uncontrolled, last A1c 9.7. Since he started pancreatic enzymes, CBGs increase, ranging from 200-450. In this setting, insulin is likely to help.  .  Start BASAGLAR 10 units daily. Hypoglycemia sx discussed, increase 2 units every 2 days until CBGs around 120 in the morning.First  shot today, next tomorrow night. Stop Amaryl. Check A1c and BMP, last creatinine was a slightly elevated

## 2017-03-17 NOTE — Progress Notes (Signed)
Subjective:    Patient ID: Ethan Mcneil, male    DOB: Apr 18, 1934, 83 y.o.   MRN: [id number]  DOS:  03/17/2017 Type of visit - description : f/u Interval history: Weight loss: Procedures and notes from GI reviewed. Was prescribed pancreatic enzymes. Since then, diarrhea has stopped ; CBGs have definitely increased. CBGs no  less than 200, today in the morning around 450.  Wt Readings from Last 3 Encounters:  03/17/17 138 lb (62.6 kg)  03/10/17 137 lb 6 oz (62.3 kg)  01/23/17 142 lb (64.4 kg)     Review of Systems Denies nausea, vomiting. Mild  polyuria and polydipsia. No fever, chills, night sweats. No fainting feeling  Past Medical History:  Diagnosis Date  . Arthritis   . CAD (coronary artery disease)    CABG 1997; grafts patent @ cath [facility] 11/09  . Chronic kidney disease   . DM2 (diabetes mellitus, type 2) ([place])   . Gout    after tick bite  . HLD (hyperlipidemia)   . HTN (hypertension)    essential nos  . Hypothyroid   . Postsurgical aortocoronary bypass status   . PVD (peripheral vascular disease) ([place])   . S/P herniorrhaphy   . Tick fever 2012   [facility] , [place]  . Unspecified hyperplasia of prostate without urinary obstruction and other lower urinary tract symptoms (LUTS)   . Urolithiasis 2015   hx of    Past Surgical History:  Procedure Laterality Date  . COLONOSCOPY    . CORONARY ARTERY BYPASS GRAFT     x7 vessels 1997; cath 2002 and 2009; grafts patent   . CYSTOSCOPY WITH RETROGRADE PYELOGRAM, URETEROSCOPY AND STENT PLACEMENT Right 02/21/2014   Procedure: CYSTOSCOPY WITH RIGHT  RETROGRADE PYELOGRAM,RIGHT  URETEROSCOPY WITH STONE BASKETING EXTRACTION;  Surgeon: Irine Seal, MD;  Location: [facility];  Service: Urology;  Laterality: Right;  . INGUINAL HERNIA REPAIR    . SHOULDER SURGERY      Social History   Social History  . Marital status: Married    Spouse name: [name]  . Number of children: 4  . Years of education: N/A   Occupational  History  . retired, works few hours     Social History Main Topics  . Smoking status: Never Smoker  . Smokeless tobacco: Never Used  . Alcohol use No  . Drug use: No  . Sexual activity: Not on file   Other Topics Concern  . Not on file   Social History Narrative   Buyer, retail and former race car driver   4 children (boys)   Designated party form signed appointing wife - [name]; ok to leave msg on home phone [phone]               Allergies as of 03/17/2017      Reactions   Codeine Nausea And Vomiting      Medication List       Accurate as of 03/17/17  7:22 PM. Always use your most recent med list.          aspirin EC 81 MG tablet Take 81 mg by mouth daily.   augmented betamethasone dipropionate 0.05 % cream Commonly known as:  DIPROLENE-AF   BASAGLAR KWIKPEN 100 UNIT/ML Sopn Inject 10 units every night, increasing 2 units every 2 days until blood sugars around 120 in mornings   glucose blood test strip Commonly known as:  FREESTYLE TEST STRIPS Check blood sugar no more than twice daily.  Insulin Pen Needle 31G X 5 MM Misc To use w/ Basaglar   levothyroxine 125 MCG tablet Commonly known as:  SYNTHROID, LEVOTHROID Take 1 tablet (125 mcg total) by mouth daily before breakfast.   metoprolol succinate 25 MG 24 hr tablet Commonly known as:  TOPROL-XL Take 0.5 tablets (12.5 mg total) by mouth daily.   Pancrelipase (Lip-Prot-Amyl) 25000 units Cpep Commonly known as:  ZENPEP Take 2 with meals and snacks   ramipril 2.5 MG capsule Commonly known as:  ALTACE Take 1 capsule (2.5 mg total) by mouth daily.   saxagliptin HCl 5 MG Tabs tablet Commonly known as:  ONGLYZA Take 1 tablet (5 mg total) by mouth daily.   simvastatin 20 MG tablet Commonly known as:  ZOCOR Take 1 tablet (20 mg total) by mouth at bedtime.          Objective:   Physical Exam BP 126/78 (BP Location: Left Arm, Patient Position: Sitting, Cuff Size: Small)   Pulse (!) 57    Temp 97.9 F (36.6 C) (Oral)   Resp 14   Ht 5' 9.5" (1.765 m)   Wt 138 lb (62.6 kg)   SpO2 95%   BMI 20.09 kg/m  General:   Well developed, well nourished . NAD.  HEENT:  Normocephalic . Face symmetric, atraumatic Skin: Not pale. Not jaundice Neurologic:  alert & oriented X3.  Speech normal, gait appropriate for age and unassisted Psych--  Cognition and judgment appear intact.  Cooperative with normal attention span and concentration.  Behavior appropriate. No anxious or depressed appearing.      Assessment & Plan:    Assessment> DM -- w/ CAD, no neuropathy, intolerant to metformin  HTN Hyperlipidemia Hypothyroidism DJD BPH, LUTS,h/o urolithiasis -- sees urology CV: --CAD, CABG 1997, cardiac cath 2009 --Peripheral vascular disease --L leg edema, chronic, (-) DVT per [place] 2015 Gout Anemia: Mild, normal iron 2015 B12 deficiency, mild 2015  Wt loss:  2012 187 lb, 2014 173 lb , 2015 167 01-2017: EGD normal, BX chronic active gastritis. Colonoscopy normal. Saw GI 02-2017: likely d/t Pancreatic insufficiency and poorly controlled diabetes Liver  lesion per Ct, next CT [id number] H/o Tick fever 2012  PLAN: Weight loss: Since the last time he was here, had a EGD, colonoscopy. Was prescribed pancreatic enzymes for the diagnosis of pancreatic insufficiency. Since then, diarrhea is resolved, CBGs have increase. DM: uncontrolled, last A1c 9.7. Since he started pancreatic enzymes, CBGs increase, ranging from 200-450. In this setting, insulin is likely to help.  .  Start BASAGLAR 10 units daily. Hypoglycemia sx discussed, increase 2 units every 2 days until CBGs around 120 in the morning.First  shot today, next tomorrow night. Stop Amaryl. Check A1c and BMP, last creatinine was a slightly elevated  Today, I spent more than 25    min with the patient: >50% of the time counseling regards benefits of insulin,   samples and teaching how to use it, reviewing the hypoglycemia symptoms  and discussing CBG  Goals

## 2017-03-17 NOTE — Patient Instructions (Addendum)
Go to the lab  Next visit 6 weeks   Start BASAGLAR  10 units every night. You got a shot this morning, next shot tomorrow night. Watch for low blood sugar symptoms Stop glimepiride  Check your blood sugar every morning and if you feel unwell. Every 2 days, increased basilar by 2 units until your blood sugar some the morning and 120.  Call with your readings in 10 days  GOALS: Fasting before a meal ~ 120 2 hours after a meal less than 180 At bedtime 120-150

## 2017-03-30 ENCOUNTER — Other Ambulatory Visit: Payer: Self-pay | Admitting: Internal Medicine

## 2017-03-31 ENCOUNTER — Telehealth: Payer: Self-pay | Admitting: Internal Medicine

## 2017-03-31 NOTE — Telephone Encounter (Signed)
Spoke w/ Mardene Celeste, Pt not currently home, left office number for Pt to return call.

## 2017-03-31 NOTE — Telephone Encounter (Signed)
Patient started insulin about 2 weeks ago. Check on him. How are CBGs? Any problems titrating the dose?

## 2017-05-14 ENCOUNTER — Telehealth: Payer: Self-pay

## 2017-05-14 NOTE — Progress Notes (Signed)
Subjective:   Ethan Mcneil is a 83 y.o. male who presents for Medicare Annual/Subsequent preventive examination.  Review of Systems:  No ROS.  Medicare Wellness Visit. Cardiac Risk Factors include: advanced age (>43men, >63 women);dyslipidemia;male gender;hypertension Sleep patterns:  Sleeps from 10p -6a. Wakes 2x to urinate and easily goes back to sleep. Feels rested. Home Safety/Smoke Alarms: Feels safe in home. Smoke alarms in place.    Living environment; residence and Firearm Safety: Lives with wife in 1 story with basement.  Guns safely stored.  Seat Belt Safety/Bike Helmet: Wears seat belt.   Counseling:   Eye Exam- Wears reading glasses. Diabetic eye exam yearly, but can't recall doctor's name. Dental- Dr.Sheets every 6 months.   Male:   CCS- Last 01/23/17: Internal hemorrhoids. Otherwise normal. No routine recall due to age.  PSA-  Lab Results  Component Value Date   PSA 1.19 05/02/2010   PSA 1.06 03/24/2009   PSA 1.13 11/03/2007       Objective:    Vitals: BP (!) 118/58 (BP Location: Right Arm, Patient Position: Sitting, Cuff Size: Normal)   Pulse (!) 49   Ht 5\' 10"  (1.778 m)   Wt 141 lb 12.8 oz (64.3 kg)   SpO2 98%   BMI 20.35 kg/m   Body mass index is 20.35 kg/m.  Wt Readings from Last 3 Encounters:  05/15/17 141 lb 12.8 oz (64.3 kg)  03/17/17 138 lb (62.6 kg)  03/10/17 137 lb 6 oz (62.3 kg)   Temp Readings from Last 3 Encounters:  03/17/17 97.9 F (36.6 C) (Oral)  01/23/17 (!) 96.4 F (35.8 C)  01/06/17 98.2 F (36.8 C) (Oral)   BP Readings from Last 3 Encounters:  05/15/17 (!) 118/58  03/17/17 126/78  03/10/17 (!) 116/50   Pulse Readings from Last 3 Encounters:  05/15/17 (!) 49  03/17/17 (!) 57  03/10/17 76   Tobacco History  Smoking Status  . Never Smoker  Smokeless Tobacco  . Never Used     Counseling given: Not Answered   Past Medical History:  Diagnosis Date  . Arthritis   . CAD (coronary artery disease)    CABG 1997;  grafts patent @ cath [facility] 11/09  . Chronic kidney disease   . DM2 (diabetes mellitus, type 2) ([place])   . Gout    after tick bite  . HLD (hyperlipidemia)   . HTN (hypertension)    essential nos  . Hypothyroid   . Postsurgical aortocoronary bypass status   . PVD (peripheral vascular disease) ([place])   . S/P herniorrhaphy   . Tick fever 2012   [facility] , [place]  . Unspecified hyperplasia of prostate without urinary obstruction and other lower urinary tract symptoms (LUTS)   . Urolithiasis 2015   hx of   Past Surgical History:  Procedure Laterality Date  . CORONARY ARTERY BYPASS GRAFT     x7 vessels 1997; cath 2002 and 2009; grafts patent   . CYSTOSCOPY WITH RETROGRADE PYELOGRAM, URETEROSCOPY AND STENT PLACEMENT Right 02/21/2014   Procedure: CYSTOSCOPY WITH RIGHT  RETROGRADE PYELOGRAM,RIGHT  URETEROSCOPY WITH STONE BASKETING EXTRACTION;  Surgeon: Irine Seal, MD;  Location: [facility];  Service: Urology;  Laterality: Right;  . INGUINAL HERNIA REPAIR    . SHOULDER SURGERY     Family History  Problem Relation Age of Onset  . Lung cancer Father   . Coronary artery disease Mother        stent  . Diabetes Brother  DM   . Prostate cancer Maternal Uncle        in his 15s  . Stroke Son        GF  . Throat cancer Son   . Colon cancer Neg Hx   . Rectal cancer Neg Hx   . Stomach cancer Neg Hx   . Esophageal cancer Neg Hx   . Pancreatic cancer Neg Hx    History  Sexual Activity  . Sexual activity: Not on file    Outpatient Encounter Prescriptions as of 05/15/2017  Medication Sig  . Insulin Glargine (BASAGLAR KWIKPEN) 100 UNIT/ML SOPN Inject 10 units every night, increasing 2 units every 2 days until blood sugars around 120 in mornings  . levothyroxine (SYNTHROID, LEVOTHROID) 125 MCG tablet Take 1 tablet (125 mcg total) by mouth daily before breakfast.  . metoprolol succinate (TOPROL-XL) 25 MG 24 hr tablet Take 0.5 tablets (12.5 mg total) by mouth daily.  .  Pancrelipase, Lip-Prot-Amyl, (ZENPEP) 25000 units CPEP Take 2 with meals and snacks  . ramipril (ALTACE) 2.5 MG capsule Take 1 capsule (2.5 mg total) by mouth daily.  . saxagliptin HCl (ONGLYZA) 5 MG TABS tablet Take 1 tablet (5 mg total) by mouth daily.  . simvastatin (ZOCOR) 20 MG tablet Take 1 tablet (20 mg total) by mouth at bedtime.  Marland Kitchen aspirin EC 81 MG tablet Take 81 mg by mouth daily.  Marland Kitchen augmented betamethasone dipropionate (DIPROLENE-AF) 0.05 % cream   . glucose blood (FREESTYLE TEST STRIPS) test strip Check blood sugar no more than twice daily. (Patient not taking: Reported on 03/17/2017)  . Insulin Pen Needle 31G X 5 MM MISC To use w/ Basaglar (Patient not taking: Reported on 05/15/2017)   Facility-Administered Encounter Medications as of 05/15/2017  Medication  . 0.9 %  sodium chloride infusion    Activities of Daily Living In your present state of health, do you have any difficulty performing the following activities: 05/15/2017 01/06/2017  Hearing? N N  Vision? N N  Difficulty concentrating or making decisions? N N  Walking or climbing stairs? N N  Dressing or bathing? N N  Doing errands, shopping? N N  Preparing Food and eating ? N -  Using the Toilet? N -  In the past six months, have you accidently leaked urine? N -  Do you have problems with loss of bowel control? N -  Managing your Medications? N -  Managing your Finances? N -  Housekeeping or managing your Housekeeping? N -  Some recent data might be hidden    Patient Care Team: Colon Branch, MD as PCP - General (Internal Medicine) Festus Aloe, MD as Consulting Physician (Urology) Josue Hector, MD as Consulting Physician (Cardiology)   Assessment:    Physical assessment deferred to PCP.  Exercise Activities and Dietary recommendations   Diet (meal preparation, eat out, water intake, caffeinated beverages, dairy products, fruits and vegetables): in general, a "healthy" diet   Breakfast:Cornflakes or  oatmeal. Coffee x3. Lunch: Grilled chicken sandwich, salad. Sweet tea and 2% milk. Dinner:  Soup or salad. Occasionally fried food. Milk.   Goals    . Maintain physical strength      Fall Risk Fall Risk  05/15/2017 01/06/2017 09/04/2016 05/23/2016 04/14/2015  Falls in the past year? No No No No No   Depression Screen PHQ 2/9 Scores 05/15/2017 01/06/2017 09/04/2016 05/23/2016  PHQ - 2 Score 0 0 0 0    Cognitive Function MMSE - Mini Mental State Exam 05/15/2017  Orientation to time 5  Orientation to Place 5  Registration 3  Attention/ Calculation 4  Recall 2  Language- name 2 objects 2  Language- repeat 0  Language- follow 3 step command 3  Language- read & follow direction 1  Write a sentence 1  Copy design 1  Total score 27        Immunization History  Administered Date(s) Administered  . Influenza Split 10/29/2011, 11/04/2012  . Influenza Whole 11/03/2007, 10/09/2009  . Influenza, High Dose Seasonal PF 11/23/2015, 09/04/2016  . Influenza,inj,Quad PF,36+ Mos 10/10/2014  . Pneumococcal Conjugate-13 11/23/2015  . Pneumococcal Polysaccharide-23 05/10/2010  . Tdap 03/07/2014  . Zoster 06/07/2014   Screening Tests Health Maintenance  Topic Date Due  . OPHTHALMOLOGY EXAM  08/01/1944  . INFLUENZA VACCINE  07/16/2017  . HEMOGLOBIN A1C  09/16/2017  . FOOT EXAM  01/06/2018  . TETANUS/TDAP  03/07/2024  . PNA vac Low Risk Adult  Completed      Plan:    Follow up with Dr.Paz today as scheduled.  Continue to eat heart healthy diet (full of fruits, vegetables, whole grains, lean protein, water--limit salt, fat, and sugar intake) and increase physical activity as tolerated.  Continue doing brain stimulating activities (puzzles, reading, adult coloring books, staying active) to keep memory sharp.   Bring a copy of your advance directives to your next office visit.  Please review educational materials provided on weight bearing and strengthening exercises.   I have personally  reviewed and noted the following in the patient's chart:   . Medical and social history . Use of alcohol, tobacco or illicit drugs  . Current medications and supplements . Functional ability and status . Nutritional status . Physical activity . Advanced directives . List of other physicians . Hospitalizations, surgeries, and ER visits in previous 12 months . Vitals . Screenings to include cognitive, depression, and falls . Referrals and appointments  In addition, I have reviewed and discussed with patient certain preventive protocols, quality metrics, and best practice recommendations. A written personalized care plan for preventive services as well as general preventive health recommendations were provided to patient.     Naaman Plummer [place], [place]  05/15/2017 Kathlene November, MD

## 2017-05-14 NOTE — Telephone Encounter (Signed)
Left message for patient to return call.

## 2017-05-15 ENCOUNTER — Encounter: Payer: Self-pay | Admitting: Internal Medicine

## 2017-05-15 ENCOUNTER — Ambulatory Visit (INDEPENDENT_AMBULATORY_CARE_PROVIDER_SITE_OTHER): Payer: Medicare Other | Admitting: Internal Medicine

## 2017-05-15 VITALS — BP 118/58 | HR 49 | Ht 70.0 in | Wt 141.8 lb

## 2017-05-15 DIAGNOSIS — IMO0002 Reserved for concepts with insufficient information to code with codable children: Secondary | ICD-10-CM

## 2017-05-15 DIAGNOSIS — E038 Other specified hypothyroidism: Secondary | ICD-10-CM | POA: Diagnosis not present

## 2017-05-15 DIAGNOSIS — E1151 Type 2 diabetes mellitus with diabetic peripheral angiopathy without gangrene: Secondary | ICD-10-CM

## 2017-05-15 DIAGNOSIS — D649 Anemia, unspecified: Secondary | ICD-10-CM

## 2017-05-15 DIAGNOSIS — K8689 Other specified diseases of pancreas: Secondary | ICD-10-CM | POA: Diagnosis not present

## 2017-05-15 DIAGNOSIS — Z0001 Encounter for general adult medical examination with abnormal findings: Secondary | ICD-10-CM | POA: Diagnosis not present

## 2017-05-15 DIAGNOSIS — E782 Mixed hyperlipidemia: Secondary | ICD-10-CM

## 2017-05-15 DIAGNOSIS — E785 Hyperlipidemia, unspecified: Secondary | ICD-10-CM | POA: Diagnosis not present

## 2017-05-15 DIAGNOSIS — Z0000 Encounter for general adult medical examination without abnormal findings: Secondary | ICD-10-CM

## 2017-05-15 DIAGNOSIS — E1165 Type 2 diabetes mellitus with hyperglycemia: Secondary | ICD-10-CM | POA: Diagnosis not present

## 2017-05-15 LAB — CBC WITH DIFFERENTIAL/PLATELET
Basophils Absolute: 0.1 10*3/uL (ref 0.0–0.1)
Basophils Relative: 1.4 % (ref 0.0–3.0)
Eosinophils Absolute: 0.6 10*3/uL (ref 0.0–0.7)
Eosinophils Relative: 7.3 % — ABNORMAL HIGH (ref 0.0–5.0)
HCT: 33.4 % — ABNORMAL LOW (ref 39.0–52.0)
Hemoglobin: 11.1 g/dL — ABNORMAL LOW (ref 13.0–17.0)
Lymphocytes Relative: 23.1 % (ref 12.0–46.0)
Lymphs Abs: 1.8 10*3/uL (ref 0.7–4.0)
MCHC: 33.3 g/dL (ref 30.0–36.0)
MCV: 91.1 fl (ref 78.0–100.0)
Monocytes Absolute: 0.7 10*3/uL (ref 0.1–1.0)
Monocytes Relative: 8.6 % (ref 3.0–12.0)
Neutro Abs: 4.7 10*3/uL (ref 1.4–7.7)
Neutrophils Relative %: 59.6 % (ref 43.0–77.0)
Platelets: 215 10*3/uL (ref 150.0–400.0)
RBC: 3.66 Mil/uL — ABNORMAL LOW (ref 4.22–5.81)
RDW: 14.2 % (ref 11.5–15.5)
WBC: 7.9 10*3/uL (ref 4.0–10.5)

## 2017-05-15 LAB — TSH: TSH: 1.02 u[IU]/mL (ref 0.35–4.50)

## 2017-05-15 LAB — HEPATIC FUNCTION PANEL
ALT: 18 U/L (ref 0–53)
AST: 17 U/L (ref 0–37)
Albumin: 2.8 g/dL — ABNORMAL LOW (ref 3.5–5.2)
Alkaline Phosphatase: 52 U/L (ref 39–117)
Bilirubin, Direct: 0.1 mg/dL (ref 0.0–0.3)
Total Bilirubin: 0.4 mg/dL (ref 0.2–1.2)
Total Protein: 5.1 g/dL — ABNORMAL LOW (ref 6.0–8.3)

## 2017-05-15 NOTE — Progress Notes (Signed)
Subjective:    Patient ID: Ethan Mcneil, male    DOB: January 09, 1934, 83 y.o.   MRN: [id number]  DOS:  05/15/2017 Type of visit - description : rov Interval history:  Weight loss: Improving. Pancreatic insufficiency: Taking enzymes but only 2 tablets a day because sometimes develops constipation. Diabetes: Started insulin, currently 10 units daily, CBGs in the morning 131, 105, 110. However from time to time it drops to 57 and has been as high as 375. HTN: Good compliance of medication no ambulatory BPs Good compliance with simvastatin and thyroid medication.  Wt Readings from Last 3 Encounters:  05/15/17 141 lb 12.8 oz (64.3 kg)  03/17/17 138 lb (62.6 kg)  03/10/17 137 lb 6 oz (62.3 kg)     Review of Systems No chest pain or difficulty breathing No nausea, vomiting, diarrhea  Past Medical History:  Diagnosis Date  . Arthritis   . CAD (coronary artery disease)    CABG 1997; grafts patent @ cath [facility] 11/09  . Chronic kidney disease   . DM2 (diabetes mellitus, type 2) ([place])   . Gout    after tick bite  . HLD (hyperlipidemia)   . HTN (hypertension)    essential nos  . Hypothyroid   . Postsurgical aortocoronary bypass status   . PVD (peripheral vascular disease) ([place])   . S/P herniorrhaphy   . Tick fever 2012   [facility] , [place]  . Unspecified hyperplasia of prostate without urinary obstruction and other lower urinary tract symptoms (LUTS)   . Urolithiasis 2015   hx of    Past Surgical History:  Procedure Laterality Date  . CORONARY ARTERY BYPASS GRAFT     x7 vessels 1997; cath 2002 and 2009; grafts patent   . CYSTOSCOPY WITH RETROGRADE PYELOGRAM, URETEROSCOPY AND STENT PLACEMENT Right 02/21/2014   Procedure: CYSTOSCOPY WITH RIGHT  RETROGRADE PYELOGRAM,RIGHT  URETEROSCOPY WITH STONE BASKETING EXTRACTION;  Surgeon: Irine Seal, MD;  Location: [facility];  Service: Urology;  Laterality: Right;  . INGUINAL HERNIA REPAIR    . SHOULDER SURGERY      Social  History   Social History  . Marital status: Married    Spouse name: [name]  . Number of children: 4  . Years of education: N/A   Occupational History  . retired, works few hours     Social History Main Topics  . Smoking status: Never Smoker  . Smokeless tobacco: Never Used  . Alcohol use No  . Drug use: No  . Sexual activity: Not on file   Other Topics Concern  . Not on file   Social History Narrative   Buyer, retail and former race car driver   4 children (boys)   Designated party form signed appointing wife - [name]; ok to leave msg on home phone [phone]               Allergies as of 05/15/2017      Reactions   Codeine Nausea And Vomiting      Medication List       Accurate as of 05/15/17 11:59 PM. Always use your most recent med list.          aspirin EC 81 MG tablet Take 81 mg by mouth daily.   augmented betamethasone dipropionate 0.05 % cream Commonly known as:  DIPROLENE-AF   BASAGLAR KWIKPEN 100 UNIT/ML Sopn Inject 10 units every night, increasing 2 units every 2 days until blood sugars around 120 in mornings  glucose blood test strip Commonly known as:  FREESTYLE TEST STRIPS Check blood sugar no more than twice daily.   Insulin Pen Needle 31G X 5 MM Misc To use w/ Basaglar   levothyroxine 125 MCG tablet Commonly known as:  SYNTHROID, LEVOTHROID Take 1 tablet (125 mcg total) by mouth daily before breakfast.   metoprolol succinate 25 MG 24 hr tablet Commonly known as:  TOPROL-XL Take 0.5 tablets (12.5 mg total) by mouth daily.   Pancrelipase (Lip-Prot-Amyl) 25000 units Cpep Commonly known as:  ZENPEP Take 2 with meals and snacks   ramipril 2.5 MG capsule Commonly known as:  ALTACE Take 1 capsule (2.5 mg total) by mouth daily.   saxagliptin HCl 5 MG Tabs tablet Commonly known as:  ONGLYZA Take 1 tablet (5 mg total) by mouth daily.   simvastatin 20 MG tablet Commonly known as:  ZOCOR Take 1 tablet (20 mg total) by mouth at  bedtime.          Objective:   Physical Exam BP (!) 118/58 (BP Location: Right Arm, Patient Position: Sitting, Cuff Size: Normal)   Pulse (!) 49   Ht 5\' 10"  (1.778 m)   Wt 141 lb 12.8 oz (64.3 kg)   SpO2 98%   BMI 20.35 kg/m   General:   Well developed, well nourished . NAD.  Neck: No  thyromegaly  HEENT:  Normocephalic . Face symmetric, atraumatic Lungs:  CTA B Normal respiratory effort, no intercostal retractions, no accessory muscle use. Heart: RRR,  no murmur.  No pretibial edema bilaterally  Abdomen:  Not distended, soft, non-tender. No rebound or rigidity.   Skin: Exposed areas without rash. Not pale. Not jaundice Neurologic:  alert & oriented X3.  Speech normal, gait appropriate for age and unassisted Strength symmetric and appropriate for age.  Psych: Cognition and judgment appear intact.  Cooperative with normal attention span and concentration.  Behavior appropriate. No anxious or depressed appearing.    Assessment & Plan:    Assessment DM -- w/ CAD, no neuropathy, intolerant to metformin  HTN Hyperlipidemia Hypothyroidism DJD BPH, LUTS,h/o urolithiasis -- sees urology CV: --CAD, CABG 1997, cardiac cath 2009 --Peripheral vascular disease (no ABIs in the chart) --L leg edema, chronic, (-) DVT per [place] 2015 Gout GI: Pancreatic insufficiency dx 02-2017 Anemia: Mild, normal iron 2015 B12 deficiency, mild 2015  Wt loss:  2012 187 lb, 2014 173 lb , 2015 167 01-2017: EGD normal, BX chronic active gastritis. Colonoscopy normal. Saw GI 02-2017: likely d/t Pancreatic insufficiency and poorly controlled diabetes. Wt better w/ pancreat enzymes  Liver  lesion per Ct, next CT [id number] H/o Tick fever 2012   PLAN: DM: started BASAGLAR just few weeks ago, on 10 u , also on  Onglyza. CBGs in the morning in the low 110s. Last A1c around 13.2 few weeks ago. No change for now, watch for low sugars,to carry glucose with him. HTN: Continue Altace, last BMP  satisfactory Hyperlipidemia: On simvastatin, last FLP satisfactory Hypothyroidism: On Synthroid, check a TSH CAD, PVD: Has not seen cardiology years, asx Pancreatic insufficiency: Doing well, gaining weight. Able to take only 2 tablets of enzymes daily due to develop of constipation. Anemia: Checking a CBC. Follow-up: A1c in one month, follow-up in 4 months.

## 2017-05-15 NOTE — Assessment & Plan Note (Signed)
--  Td 2015; PNM 23 shot-- 2011; PNM 13--- 11-2015; zostavax: 2015; shingrex discussed, on back order  --CCS--07/2011--Dr Perry-adenomatous polyps, cscope 01-2017, next per GI -- PSA-- sees urology frequently

## 2017-05-15 NOTE — Patient Instructions (Addendum)
GO TO THE LAB : Get the blood work     GO TO [place]  Schedule labs to be done in one month (A1c)  Schedule your next appointment for a visit with me in 4 months   Diabetes: Check your blood sugar  twice  a day: in the morning at another time  GOALS: Fasting before a meal 70- 130 2 hours after a meal less than 180 At bedtime 90-150 Call if consistently not at goal  Carry glucose or sugar with you in case you have symptoms of low blood sugar   [name] , Thank you for taking time to come for your Medicare Wellness Visit. I appreciate your ongoing commitment to your health goals. Please review the following plan we discussed and let me know if I can assist you in the future.   These are the goals we discussed: Goals    . Maintain physical strength       This is a list of the screening recommended for you and due dates:  Health Maintenance  Topic Date Due  . Eye exam for diabetics  [birth year]1946  . Flu Shot  07/16/2017  . Hemoglobin A1C  09/16/2017  . Complete foot exam   01/06/2018  . Tetanus Vaccine  03/07/2024  . Pneumonia vaccines  Completed    Continue to eat heart healthy diet (full of fruits, vegetables, whole grains, lean protein, water--limit salt, fat, and sugar intake) and increase physical activity as tolerated.  Continue doing brain stimulating activities (puzzles, reading, adult coloring books, staying active) to keep memory sharp.   Bring a copy of your advance directives to your next office visit.  Please review educational materials provided on weight bearing and strengthening exercises.

## 2017-05-16 NOTE — Assessment & Plan Note (Signed)
DM: started BASAGLAR just few weeks ago, on 10 u , also on  Onglyza. CBGs in the morning in the low 110s. Last A1c around 13.2 few weeks ago. No change for now, watch for low sugars,to carry glucose with him. HTN: Continue Altace, last BMP satisfactory Hyperlipidemia: On simvastatin, last FLP satisfactory Hypothyroidism: On Synthroid, check a TSH CAD, PVD: Has not seen cardiology years, asx Pancreatic insufficiency: Doing well, gaining weight. Able to take only 2 tablets of enzymes daily due to develop of constipation. Anemia: Checking a CBC. Follow-up: A1c in one month, follow-up in 4 months.

## 2017-05-29 ENCOUNTER — Other Ambulatory Visit: Payer: Self-pay

## 2017-05-29 ENCOUNTER — Telehealth: Payer: Self-pay

## 2017-05-29 DIAGNOSIS — K7689 Other specified diseases of liver: Secondary | ICD-10-CM

## 2017-05-29 NOTE — Telephone Encounter (Signed)
Pt scheduled for MRI of liver with and without at [facility] 06/05/17@8am . Pt to arrive there at 7:45am and be NPO after midnight. Left message for pt to call back.

## 2017-05-29 NOTE — Telephone Encounter (Signed)
-----   Message from Algernon Huxley, RN sent at 11/28/2016  3:22 PM EST ----- Regarding: MRI Pt needs MRI in 6 mth, see ct from December

## 2017-06-02 NOTE — Telephone Encounter (Signed)
Spoke with pt and he is aware of appt and prep instructions.

## 2017-06-05 ENCOUNTER — Ambulatory Visit (HOSPITAL_COMMUNITY)
Admission: RE | Admit: 2017-06-05 | Discharge: 2017-06-05 | Disposition: A | Discharge status: Home / Self Care | Payer: Medicare Other | Source: Ambulatory Visit | Attending: Internal Medicine | Admitting: Internal Medicine

## 2017-06-05 DIAGNOSIS — K7689 Other specified diseases of liver: Secondary | ICD-10-CM | POA: Insufficient documentation

## 2017-06-05 DIAGNOSIS — K8689 Other specified diseases of pancreas: Secondary | ICD-10-CM | POA: Diagnosis not present

## 2017-06-05 DIAGNOSIS — R935 Abnormal findings on diagnostic imaging of other abdominal regions, including retroperitoneum: Secondary | ICD-10-CM | POA: Diagnosis not present

## 2017-06-05 LAB — POCT I-STAT CREATININE: Creatinine, Ser: 1.2 mg/dL (ref 0.61–1.24)

## 2017-06-05 MED ORDER — GADOBENATE DIMEGLUMINE 529 MG/ML IV SOLN
15.0000 mL | Freq: Once | INTRAVENOUS | Status: AC | PRN
  Administered 2017-06-05: 13 mL via INTRAVENOUS

## 2017-06-12 ENCOUNTER — Other Ambulatory Visit (INDEPENDENT_AMBULATORY_CARE_PROVIDER_SITE_OTHER): Payer: Medicare Other

## 2017-06-12 DIAGNOSIS — E1165 Type 2 diabetes mellitus with hyperglycemia: Secondary | ICD-10-CM

## 2017-06-12 DIAGNOSIS — E1151 Type 2 diabetes mellitus with diabetic peripheral angiopathy without gangrene: Secondary | ICD-10-CM

## 2017-06-12 DIAGNOSIS — IMO0002 Reserved for concepts with insufficient information to code with codable children: Secondary | ICD-10-CM

## 2017-06-12 LAB — HEMOGLOBIN A1C: Hgb A1c MFr Bld: 10.4 % — ABNORMAL HIGH (ref 4.6–6.5)

## 2017-06-19 ENCOUNTER — Other Ambulatory Visit: Payer: Self-pay | Admitting: Internal Medicine

## 2017-06-23 ENCOUNTER — Other Ambulatory Visit: Payer: Self-pay | Admitting: Internal Medicine

## 2017-08-19 ENCOUNTER — Other Ambulatory Visit: Payer: Self-pay | Admitting: Internal Medicine

## 2017-08-28 ENCOUNTER — Other Ambulatory Visit: Payer: Self-pay | Admitting: Internal Medicine

## 2017-09-16 ENCOUNTER — Ambulatory Visit (INDEPENDENT_AMBULATORY_CARE_PROVIDER_SITE_OTHER): Payer: Medicare Other | Admitting: Internal Medicine

## 2017-09-16 ENCOUNTER — Encounter: Payer: Self-pay | Admitting: Internal Medicine

## 2017-09-16 VITALS — BP 132/56 | HR 45 | Temp 98.2°F | Resp 14 | Ht 70.0 in | Wt 144.1 lb

## 2017-09-16 DIAGNOSIS — E1165 Type 2 diabetes mellitus with hyperglycemia: Secondary | ICD-10-CM

## 2017-09-16 DIAGNOSIS — E782 Mixed hyperlipidemia: Secondary | ICD-10-CM

## 2017-09-16 DIAGNOSIS — D649 Anemia, unspecified: Secondary | ICD-10-CM | POA: Diagnosis not present

## 2017-09-16 DIAGNOSIS — IMO0002 Reserved for concepts with insufficient information to code with codable children: Secondary | ICD-10-CM

## 2017-09-16 DIAGNOSIS — E1151 Type 2 diabetes mellitus with diabetic peripheral angiopathy without gangrene: Secondary | ICD-10-CM

## 2017-09-16 DIAGNOSIS — Z23 Encounter for immunization: Secondary | ICD-10-CM

## 2017-09-16 DIAGNOSIS — I10 Essential (primary) hypertension: Secondary | ICD-10-CM

## 2017-09-16 LAB — COMPREHENSIVE METABOLIC PANEL
ALT: 17 U/L (ref 0–53)
AST: 18 U/L (ref 0–37)
Albumin: 2.8 g/dL — ABNORMAL LOW (ref 3.5–5.2)
Alkaline Phosphatase: 42 U/L (ref 39–117)
BUN: 13 mg/dL (ref 6–23)
CO2: 30 mEq/L (ref 19–32)
Calcium: 8.2 mg/dL — ABNORMAL LOW (ref 8.4–10.5)
Chloride: 105 mEq/L (ref 96–112)
Creatinine, Ser: 1.3 mg/dL (ref 0.40–1.50)
GFR: 56.29 mL/min — ABNORMAL LOW (ref >60.00)
Glucose, Bld: 81 mg/dL (ref 70–99)
Potassium: 3.8 mEq/L (ref 3.5–5.1)
Sodium: 138 mEq/L (ref 135–145)
Total Bilirubin: 0.3 mg/dL (ref 0.2–1.2)
Total Protein: 4.8 g/dL — ABNORMAL LOW (ref 6.0–8.3)

## 2017-09-16 LAB — LIPID PANEL
Cholesterol: 119 mg/dL (ref 0–200)
HDL: 45.3 mg/dL (ref >39.00)
LDL Cholesterol: 59 mg/dL (ref 0–99)
NonHDL: 73.35
Total CHOL/HDL Ratio: 3
Triglycerides: 73 mg/dL (ref 0.0–149.0)
VLDL: 14.6 mg/dL (ref 0.0–40.0)

## 2017-09-16 LAB — HEMOGLOBIN A1C: Hgb A1c MFr Bld: 9.6 % — ABNORMAL HIGH (ref 4.6–6.5)

## 2017-09-16 LAB — FERRITIN: Ferritin: 18 ng/mL — ABNORMAL LOW (ref 22.0–322.0)

## 2017-09-16 LAB — HEMOGLOBIN: Hemoglobin: 11 g/dL — ABNORMAL LOW (ref 13.0–17.0)

## 2017-09-16 LAB — IRON: Iron: 71 ug/dL (ref 42–165)

## 2017-09-16 NOTE — Progress Notes (Signed)
Pre visit review using our clinic review tool, if applicable. No additional management support is needed unless otherwise documented below in the visit note. 

## 2017-09-16 NOTE — Progress Notes (Signed)
Subjective:    Patient ID: Ethan Mcneil, male    DOB: [birth year], 83 y.o.   MRN: [id number]  DOS:  09/16/2017 Type of visit - description : rov Interval history: DM: Currently on insulin 10 units on Onglyza. CBGs in the morning usually 100, 110. From time to time he gets low AM CBGs, this morning was 58. He recognizes the symptoms and drinks some OJ. Sugars in the morning depending on diet the night before HTN: Good medication compliance, no ambulatory CBGs Hypothyroidism: Good compliance with Synthroid.  Wt Readings from Last 3 Encounters:  09/16/17 144 lb 2 oz (65.4 kg)  05/15/17 141 lb 12.8 oz (64.3 kg)  03/17/17 138 lb (62.6 kg)     Review of Systems  Denies chest pain or difficulty breathing. Appetite is okay No nausea, vomiting, diarrhea Past Medical History:  Diagnosis Date  . Arthritis   . CAD (coronary artery disease)    CABG 1997; grafts patent @ cath [facility] 11/09  . Chronic kidney disease   . DM2 (diabetes mellitus, type 2) ([place])   . Gout    after tick bite  . HLD (hyperlipidemia)   . HTN (hypertension)    essential nos  . Hypothyroid   . Postsurgical aortocoronary bypass status   . PVD (peripheral vascular disease) ([place])   . S/P herniorrhaphy   . Tick fever 2012   [facility] , [place]  . Unspecified hyperplasia of prostate without urinary obstruction and other lower urinary tract symptoms (LUTS)   . Urolithiasis 2015   hx of    Past Surgical History:  Procedure Laterality Date  . CORONARY ARTERY BYPASS GRAFT     x7 vessels 1997; cath 2002 and 2009; grafts patent   . CYSTOSCOPY WITH RETROGRADE PYELOGRAM, URETEROSCOPY AND STENT PLACEMENT Right 02/21/2014   Procedure: CYSTOSCOPY WITH RIGHT  RETROGRADE PYELOGRAM,RIGHT  URETEROSCOPY WITH STONE BASKETING EXTRACTION;  Surgeon: Irine Seal, MD;  Location: [facility];  Service: Urology;  Laterality: Right;  . INGUINAL HERNIA REPAIR    . SHOULDER SURGERY      Social History   Social History  .  Marital status: Married    Spouse name: [name]  . Number of children: 4  . Years of education: N/A   Occupational History  . retired, works few hours     Social History Main Topics  . Smoking status: Never Smoker  . Smokeless tobacco: Never Used  . Alcohol use No  . Drug use: No  . Sexual activity: Not on file   Other Topics Concern  . Not on file   Social History Narrative   Buyer, retail and former race car driver   4 children (boys)   Designated party form signed appointing wife - [name]; ok to leave msg on home phone [phone]               Allergies as of 09/16/2017      Reactions   Codeine Nausea And Vomiting      Medication List       Accurate as of 09/16/17 11:59 PM. Always use your most recent med list.          aspirin EC 81 MG tablet Take 81 mg by mouth daily.   augmented betamethasone dipropionate 0.05 % cream Commonly known as:  DIPROLENE-AF   BASAGLAR KWIKPEN 100 UNIT/ML Sopn Inject 10 units every night, increasing 2 units every 2 days until blood sugars around 120 in mornings   glucose blood  test strip Commonly known as:  FREESTYLE TEST STRIPS Check blood sugar no more than twice daily.   Insulin Pen Needle 31G X 5 MM Misc To use w/ Basaglar   levothyroxine 125 MCG tablet Commonly known as:  SYNTHROID, LEVOTHROID Take 1 tablet (125 mcg total) by mouth daily before breakfast.   metoprolol succinate 25 MG 24 hr tablet Commonly known as:  TOPROL-XL Take 0.5 tablets (12.5 mg total) by mouth daily.   Pancrelipase (Lip-Prot-Amyl) 25000 units Cpep Commonly known as:  ZENPEP Take 2 with meals and snacks   ramipril 2.5 MG capsule Commonly known as:  ALTACE Take 1 capsule (2.5 mg total) by mouth daily.   saxagliptin HCl 5 MG Tabs tablet Commonly known as:  ONGLYZA Take 1 tablet (5 mg total) by mouth daily.   simvastatin 20 MG tablet Commonly known as:  ZOCOR Take 1 tablet (20 mg total) by mouth at bedtime.            Objective:   Physical Exam BP (!) 132/56 (BP Location: Left Arm, Patient Position: Sitting, Cuff Size: Small)   Pulse (!) 45   Temp 98.2 F (36.8 C) (Oral)   Resp 14   Ht 5\' 10"  (1.778 m)   Wt 144 lb 2 oz (65.4 kg)   SpO2 96%   BMI 20.68 kg/m  General:   Well developed, well nourished . NAD.  HEENT:  Normocephalic . Face symmetric, atraumatic Lungs:  CTA B Normal respiratory effort, no intercostal retractions, no accessory muscle use. Heart: RRR,  no murmur.  No pretibial edema bilaterally  Skin: Not pale. Not jaundice Neurologic:  alert & oriented X3.  Speech normal, gait appropriate for age and unassisted Psych--  Cognition and judgment appear intact.  Cooperative with normal attention span and concentration.  Behavior appropriate. No anxious or depressed appearing.      Assessment & Plan:   Assessment DM -- w/ CAD, no neuropathy, intolerant to metformin  HTN Hyperlipidemia Hypothyroidism DJD BPH, LUTS,h/o urolithiasis -- sees urology CV: --CAD, CABG 1997, cardiac cath 2009 --Peripheral vascular disease (no ABIs in the chart) --L leg edema, chronic, (-) DVT per [place] 2015 Gout GI: Pancreatic insufficiency dx 02-2017 Anemia: Mild, normal iron 2015 B12 deficiency, mild 2015  Wt loss:  2012 187 lb, 2014 173 lb , 2015 167 01-2017: EGD normal, BX chronic active gastritis. Colonoscopy normal. Saw GI 02-2017: likely d/t Pancreatic insufficiency and poorly controlled diabetes. Wt better w/ pancreat enzymes  Liver  lesion per Ct, liver MRI 05-2017 benign, no further workup  H/o Tick fever 2012   PLAN: DM: Currently basaglar 10 units and Onglyza. CBGs usually very good at 110 unless he eats very little the night before. Recommend consistent dinners, check a A1c. HTN: Controlled on Altace, Toprol. Check labs High cholesterol: On simvastatin, checking a FLP and liver tests Hypothyroidism: Last TSH satisfactory Abnormal CT of the liver: Chart reviewed, MRI satisfactory,  no further eval Iron deficiency anemia: Checking a hemoglobin, iron, ferritin; not on supplements. Flu shot today RTC 4-5 months

## 2017-09-16 NOTE — Patient Instructions (Addendum)
GO TO THE LAB : Get the blood work     GO TO THE FRONT DESK Schedule your next appointment for a  4-5 months, routine checkup

## 2017-09-17 NOTE — Assessment & Plan Note (Signed)
DM: Currently basaglar 10 units and Onglyza. CBGs usually very good at 110 unless he eats very little the night before. Recommend consistent dinners, check a A1c. HTN: Controlled on Altace, Toprol. Check labs High cholesterol: On simvastatin, checking a FLP and liver tests Hypothyroidism: Last TSH satisfactory Abnormal CT of the liver: Chart reviewed, MRI satisfactory, no further eval Iron deficiency anemia: Checking a hemoglobin, iron, ferritin; not on supplements. Flu shot today RTC 4-5 months

## 2017-09-18 MED ORDER — FERROUS SULFATE 325 (65 FE) MG PO TABS: 325.0000 mg | ORAL_TABLET | Freq: Two times a day (BID) | ORAL | 6 refills | Status: DC

## 2017-09-18 NOTE — Addendum Note (Signed)
Addended byDamita Dunnings D on: 09/18/2017 08:37 AM   Modules accepted: Orders

## 2017-10-05 ENCOUNTER — Other Ambulatory Visit: Payer: Self-pay | Admitting: Internal Medicine

## 2017-10-05 DIAGNOSIS — E1165 Type 2 diabetes mellitus with hyperglycemia: Principal | ICD-10-CM

## 2017-10-05 DIAGNOSIS — IMO0002 Reserved for concepts with insufficient information to code with codable children: Secondary | ICD-10-CM

## 2017-10-05 DIAGNOSIS — E1151 Type 2 diabetes mellitus with diabetic peripheral angiopathy without gangrene: Secondary | ICD-10-CM

## 2017-10-06 NOTE — Telephone Encounter (Signed)
Please be sure has an appointment to see endocrinology. Okay to refill Onglyza 3 months

## 2017-10-06 NOTE — Telephone Encounter (Signed)
Pt requesting refill on Onglyza 5mg . At last OV (lab notes) you recommended endo referral. Per referral notes, Pt would call back to schedule and has not. Please advise.

## 2017-10-06 NOTE — Telephone Encounter (Signed)
ENDO referral placed again. Onglyza refilled for #90 and 0RF.

## 2017-11-18 ENCOUNTER — Encounter: Payer: Self-pay | Admitting: Endocrinology

## 2017-11-18 ENCOUNTER — Ambulatory Visit (INDEPENDENT_AMBULATORY_CARE_PROVIDER_SITE_OTHER): Payer: Medicare Other | Admitting: Endocrinology

## 2017-11-18 VITALS — BP 122/58 | HR 59 | Wt 145.4 lb

## 2017-11-18 DIAGNOSIS — IMO0002 Reserved for concepts with insufficient information to code with codable children: Secondary | ICD-10-CM

## 2017-11-18 DIAGNOSIS — E1165 Type 2 diabetes mellitus with hyperglycemia: Secondary | ICD-10-CM | POA: Diagnosis not present

## 2017-11-18 DIAGNOSIS — E1151 Type 2 diabetes mellitus with diabetic peripheral angiopathy without gangrene: Secondary | ICD-10-CM

## 2017-11-18 LAB — POCT GLYCOSYLATED HEMOGLOBIN (HGB A1C): Hemoglobin A1C: 10.4

## 2017-11-18 MED ORDER — BASAGLAR KWIKPEN 100 UNIT/ML ~~LOC~~ SOPN
6.0000 [IU] | PEN_INJECTOR | Freq: Every day | SUBCUTANEOUS | 2 refills | Status: DC
Start: 2017-11-18 — End: 2018-08-18

## 2017-11-18 MED ORDER — INSULIN LISPRO 100 UNIT/ML (KWIKPEN): 2.0000 [IU] | PEN_INJECTOR | Freq: Three times a day (TID) | SUBCUTANEOUS | 11 refills | Status: DC

## 2017-11-18 NOTE — Patient Instructions (Addendum)
good diet and exercise significantly improve the control of your diabetes.  please let me know if you wish to be referred to a dietician.  high blood sugar is very risky to your health.  you should see an eye doctor and dentist every year.  It is very important to get all recommended vaccinations.   Controlling your blood pressure and cholesterol drastically reduces the damage diabetes does to your body.  Those who smoke should quit.  Please discuss these with your doctor.   check your blood sugar 4 times a day.  vary the time of day when you check, between before the 3 meals, and at bedtime.  also check if you have symptoms of your blood sugar being too high or too low.  please keep a record of the readings and bring it to your next appointment here (or you can bring the meter itself).  You can write it on any piece of paper.  please call us sooner if your blood sugar goes below 70, or if you have a lot of readings over 200.   For now, please decrease the basaglar to 6 units at bedtime, and add humalog, 2 units 3 times a day (just before each meal). Please call or message [place] next week, to tell us how the blood sugar is doing.  When the blood sugar is better, we can phase out the onglyza.  Please come back for a follow-up appointment in 2 months.

## 2017-11-18 NOTE — Progress Notes (Signed)
Subjective:    Patient ID: Ethan Mcneil, male    DOB: 07/09/1934, 83 y.o.   MRN: [id number]  HPI pt is referred by Dr Larose Kells, for diabetes.  Pt states DM was dx'ed in 2013; he has mild neuropathy of the lower extremities; he has associated renal insuff, CAD, and PAD; he has been on insulin since 2017; pt says his diet and exercise are good; he had pancreatitis in 2017, he has never had pancreatic surgery, severe hypoglycemia or DKA.   He takes basaglar 10 units qhs, and onglyza.  He brings a record of his cbg's which I have reviewed today.  All are checked fasting.  It varies from 77-200.   Past Medical History:  Diagnosis Date  . Arthritis   . CAD (coronary artery disease)    CABG 1997; grafts patent @ cath [facility] 11/09  . Chronic kidney disease   . DM2 (diabetes mellitus, type 2) ([place])   . Gout    after tick bite  . HLD (hyperlipidemia)   . HTN (hypertension)    essential nos  . Hypothyroid   . Postsurgical aortocoronary bypass status   . PVD (peripheral vascular disease) ([place])   . S/P herniorrhaphy   . Tick fever 2012   [facility] , [place]  . Unspecified hyperplasia of prostate without urinary obstruction and other lower urinary tract symptoms (LUTS)   . Urolithiasis 2015   hx of    Past Surgical History:  Procedure Laterality Date  . CORONARY ARTERY BYPASS GRAFT     x7 vessels 1997; cath 2002 and 2009; grafts patent   . CYSTOSCOPY WITH RETROGRADE PYELOGRAM, URETEROSCOPY AND STENT PLACEMENT Right 02/21/2014   Procedure: CYSTOSCOPY WITH RIGHT  RETROGRADE PYELOGRAM,RIGHT  URETEROSCOPY WITH STONE BASKETING EXTRACTION;  Surgeon: Irine Seal, MD;  Location: [facility];  Service: Urology;  Laterality: Right;  . INGUINAL HERNIA REPAIR    . SHOULDER SURGERY      Social History   Socioeconomic History  . Marital status: Married    Spouse name: [name]  . Number of children: 4  . Years of education: Not on file  . Highest education level: Not on file  Social Needs  .  Financial resource strain: Not on file  . Food insecurity - worry: Not on file  . Food insecurity - inability: Not on file  . Transportation needs - medical: Not on file  . Transportation needs - non-medical: Not on file  Occupational History  . Occupation: retired, works few hours   Tobacco Use  . Smoking status: Never Smoker  . Smokeless tobacco: Never Used  Substance and Sexual Activity  . Alcohol use: No  . Drug use: No  . Sexual activity: Not on file  Other Topics Concern  . Not on file  Social History Narrative   Buyer, retail and former race car driver   4 children (boys)   Designated party form signed appointing wife - [name]; ok to leave msg on home phone [phone]          Current Outpatient Medications on File Prior to Visit  Medication Sig Dispense Refill  . aspirin EC 81 MG tablet Take 81 mg by mouth daily.    . ferrous sulfate 325 (65 FE) MG tablet Take 1 tablet (325 mg total) by mouth 2 (two) times daily before a meal. 60 tablet 6  . glucose blood (FREESTYLE TEST STRIPS) test strip Check blood sugar no more than twice daily. 100 each 12  .  Insulin Pen Needle 31G X 5 MM MISC To use w/ Basaglar 100 each 5  . levothyroxine (SYNTHROID, LEVOTHROID) 125 MCG tablet Take 1 tablet (125 mcg total) by mouth daily before breakfast. 90 tablet 1  . metoprolol succinate (TOPROL-XL) 25 MG 24 hr tablet Take 0.5 tablets (12.5 mg total) by mouth daily. 45 tablet 2  . Pancrelipase, Lip-Prot-Amyl, (ZENPEP) 25000 units CPEP Take 2 with meals and snacks 810 capsule 3  . ramipril (ALTACE) 2.5 MG capsule Take 1 capsule (2.5 mg total) by mouth daily. 90 capsule 1  . saxagliptin HCl (ONGLYZA) 5 MG TABS tablet Take 1 tablet (5 mg total) by mouth daily. 90 tablet 0  . simvastatin (ZOCOR) 20 MG tablet Take 1 tablet (20 mg total) by mouth at bedtime. 90 tablet 0  . augmented betamethasone dipropionate (DIPROLENE-AF) 0.05 % cream      No current facility-administered medications on  file prior to visit.     Allergies  Allergen Reactions  . Codeine Nausea And Vomiting    Family History  Problem Relation Age of Onset  . Lung cancer Father   . Coronary artery disease Mother        stent  . Diabetes Brother   . Prostate cancer Maternal Uncle        in his 26s  . Stroke Son        GF  . Throat cancer Son   . Colon cancer Neg Hx   . Rectal cancer Neg Hx   . Stomach cancer Neg Hx   . Esophageal cancer Neg Hx   . Pancreatic cancer Neg Hx     BP (!) 122/58 (BP Location: Left Arm, Patient Position: Sitting, Cuff Size: Normal)   Pulse (!) 59   Wt 145 lb 6.4 oz (66 kg)   SpO2 98%   BMI 20.86 kg/m    Review of Systems denies blurry vision, headache, chest pain, sob, n/v, urinary frequency, excessive diaphoresis, memory loss, and depression.  He lost 30 lbs from 2015-2017.  He has leg cramps, easy bruising, rhinorrhea, and cold intolerance.      Objective:   Physical Exam VS: see vs page GEN: no distress HEAD: head: no deformity eyes: no periorbital swelling, no proptosis external nose and ears are normal mouth: no lesion seen NECK: supple, thyroid is not enlarged CHEST WALL: no deformity.  Old healed surgical scar (median sternotomy) LUNGS: clear to auscultation CV: reg rate and rhythm, no murmur ABD: abdomen is soft, nontender.  no hepatosplenomegaly.  not distended.  no hernia MUSCULOSKELETAL: muscle bulk and strength are grossly normal.  no obvious joint swelling.  gait is normal and steady EXTEMITIES: no deformity.  no ulcer on the feet.  feet are of normal color and temp.  2+ left leg edema, and 1+ on the right.  PULSES: dorsalis pedis intact bilat.  no carotid bruit NEURO:  cn 2-12 grossly intact.   readily moves all 4's.  sensation is intact to touch on the feet.  SKIN:  Normal texture and temperature.  No rash or suspicious lesion is visible.  Old healed surgical scar (vein harvest) at the left leg.   NODES:  None palpable at the neck.   PSYCH:  alert, well-oriented.  Does not appear anxious nor depressed.     Lab Results  Component Value Date   CREATININE 1.30 09/16/2017   BUN 13 09/16/2017   NA 138 09/16/2017   K 3.8 09/16/2017   CL 105 09/16/2017   CO2  30 09/16/2017   I personally reviewed electrocardiogram tracing (02/21/14): Indication: preop Impression: SB.  No MI.  No hypertrophy. Compared to 10/29/11: no significant change     Assessment & Plan:  Insulin-requiring type 2 DM, with PAD: The pattern of his cbg's indicates he needs some adjustment in his therapy. lean body habitus: this raises the possibility he is evolving type 1.  Pancreatitis, by hx.  We'll have to phase out the onglyza when we can.    Patient Instructions  good diet and exercise significantly improve the control of your diabetes.  please let me know if you wish to be referred to a dietician.  high blood sugar is very risky to your health.  you should see an eye doctor and dentist every year.  It is very important to get all recommended vaccinations.   Controlling your blood pressure and cholesterol drastically reduces the damage diabetes does to your body.  Those who smoke should quit.  Please discuss these with your doctor.   check your blood sugar 4 times a day.  vary the time of day when you check, between before the 3 meals, and at bedtime.  also check if you have symptoms of your blood sugar being too high or too low.  please keep a record of the readings and bring it to your next appointment here (or you can bring the meter itself).  You can write it on any piece of paper.  please call us sooner if your blood sugar goes below 70, or if you have a lot of readings over 200.   For now, please decrease the basaglar to 6 units at bedtime, and add humalog, 2 units 3 times a day (just before each meal). Please call or message [place] next week, to tell us how the blood sugar is doing.  When the blood sugar is better, we can phase out the onglyza.  Please come  back for a follow-up appointment in 2 months.

## 2017-11-26 ENCOUNTER — Other Ambulatory Visit: Payer: Self-pay

## 2017-11-26 MED ORDER — SIMVASTATIN 20 MG PO TABS: 20.0000 mg | ORAL_TABLET | Freq: Every day | ORAL | 1 refills | Status: DC

## 2017-12-03 ENCOUNTER — Telehealth: Payer: Self-pay | Admitting: Internal Medicine

## 2017-12-04 NOTE — Telephone Encounter (Signed)
I refilled

## 2017-12-13 ENCOUNTER — Other Ambulatory Visit: Payer: Self-pay | Admitting: Internal Medicine

## 2018-01-02 ENCOUNTER — Other Ambulatory Visit: Payer: Self-pay | Admitting: Internal Medicine

## 2018-01-20 ENCOUNTER — Encounter: Payer: Self-pay | Admitting: Endocrinology

## 2018-01-20 ENCOUNTER — Ambulatory Visit (INDEPENDENT_AMBULATORY_CARE_PROVIDER_SITE_OTHER): Payer: Medicare Other | Admitting: Endocrinology

## 2018-01-20 VITALS — BP 124/62 | HR 54 | Wt 149.6 lb

## 2018-01-20 DIAGNOSIS — IMO0002 Reserved for concepts with insufficient information to code with codable children: Secondary | ICD-10-CM

## 2018-01-20 DIAGNOSIS — E1165 Type 2 diabetes mellitus with hyperglycemia: Secondary | ICD-10-CM | POA: Diagnosis not present

## 2018-01-20 DIAGNOSIS — E1151 Type 2 diabetes mellitus with diabetic peripheral angiopathy without gangrene: Secondary | ICD-10-CM

## 2018-01-20 LAB — POCT GLYCOSYLATED HEMOGLOBIN (HGB A1C): Hemoglobin A1C: 8.6

## 2018-01-20 MED ORDER — INSULIN LISPRO 100 UNIT/ML (KWIKPEN): PEN_INJECTOR | SUBCUTANEOUS | 11 refills | Status: DC

## 2018-01-20 NOTE — Patient Instructions (Addendum)
check your blood sugar 4 times a day.  vary the time of day when you check, between before the 3 meals, and at bedtime.  also check if you have symptoms of your blood sugar being too high or too low.  please keep a record of the readings and bring it to your next appointment here (or you can bring the meter itself).  You can write it on any piece of paper.  please call us sooner if your blood sugar goes below 70, or if you have a lot of readings over 200.   For now, please decrease the basaglar to 6 units at bedtime, and increase the humalog to 3 times a day (just before each meal), 05-21-09 units Please stop taking the onglyza, and:  Please come back for a follow-up appointment in 2 months.

## 2018-01-20 NOTE — Progress Notes (Signed)
Subjective:    Patient ID: Ethan Mcneil, male    DOB: 11-26-1934, 84 y.o.   MRN: [id number]  HPI Pt returns for f/u of diabetes mellitus: DM type: Insulin-requiring type 2. Dx'ed: 5329 Complications: polyneuropathy, renal insuff, CAD, and PAD Therapy: insulin since 2017 GDM: never DKA: never Severe hypoglycemia: never Pancreatitis: once (2017) Pancreatic imaging: chronic atrophy with diffuse pancreatic duct dilatation Other: he takes multiple daily injections. Interval history: He takes 4-8 units qac, and basaglar, 8 units qhs.  no cbg record, but states cbg's vary from 64-200's.  It is in general higher as the day goes on.  pt states he feels well in general. Past Medical History:  Diagnosis Date  . Arthritis   . CAD (coronary artery disease)    CABG 1997; grafts patent @ cath [facility] 11/09  . Chronic kidney disease   . DM2 (diabetes mellitus, type 2) ([place])   . Gout    after tick bite  . HLD (hyperlipidemia)   . HTN (hypertension)    essential nos  . Hypothyroid   . Postsurgical aortocoronary bypass status   . PVD (peripheral vascular disease) ([place])   . S/P herniorrhaphy   . Tick fever 2012   [facility] , [place]  . Unspecified hyperplasia of prostate without urinary obstruction and other lower urinary tract symptoms (LUTS)   . Urolithiasis 2015   hx of    Past Surgical History:  Procedure Laterality Date  . CORONARY ARTERY BYPASS GRAFT     x7 vessels 1997; cath 2002 and 2009; grafts patent   . CYSTOSCOPY WITH RETROGRADE PYELOGRAM, URETEROSCOPY AND STENT PLACEMENT Right 02/21/2014   Procedure: CYSTOSCOPY WITH RIGHT  RETROGRADE PYELOGRAM,RIGHT  URETEROSCOPY WITH STONE BASKETING EXTRACTION;  Surgeon: Irine Seal, MD;  Location: [facility];  Service: Urology;  Laterality: Right;  . INGUINAL HERNIA REPAIR    . SHOULDER SURGERY      Social History   Socioeconomic History  . Marital status: Married    Spouse name: [name]  . Number of children: 4  . Years of  education: Not on file  . Highest education level: Not on file  Social Needs  . Financial resource strain: Not on file  . Food insecurity - worry: Not on file  . Food insecurity - inability: Not on file  . Transportation needs - medical: Not on file  . Transportation needs - non-medical: Not on file  Occupational History  . Occupation: retired, works few hours   Tobacco Use  . Smoking status: Never Smoker  . Smokeless tobacco: Never Used  Substance and Sexual Activity  . Alcohol use: No  . Drug use: No  . Sexual activity: Not on file  Other Topics Concern  . 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 aspirin EC 81 MG tablet Take 81 mg by mouth daily.    Marland Kitchen augmented betamethasone dipropionate (DIPROLENE-AF) 0.05 % cream     . ferrous sulfate 325 (65 FE) MG tablet Take 1 tablet (325 mg total) by mouth 2 (two) times daily before a meal. 60 tablet 6  . glucose blood (FREESTYLE TEST STRIPS) test strip CHECK BLOOD SUGAR NO MORE  THAN TWICE DAILY. 200 each 3  . Insulin Glargine (BASAGLAR KWIKPEN) 100 UNIT/ML SOPN Inject 0.06 mLs (6 Units total) into the skin at bedtime. 15 mL 2  . Insulin Pen Needle 31G X 5 MM MISC To use w/ Basaglar 100 each 5  . levothyroxine (SYNTHROID, LEVOTHROID) 125 MCG tablet Take 1 tablet (125 mcg total) by mouth daily before breakfast. 90 tablet 1  . metoprolol succinate (TOPROL-XL) 25 MG 24 hr tablet Take 0.5 tablets (12.5 mg total) by mouth daily. 45 tablet 2  . Pancrelipase, Lip-Prot-Amyl, (ZENPEP) 25000 units CPEP Take 2 with meals and snacks 810 capsule 3  . ramipril (ALTACE) 2.5 MG capsule Take 1 capsule (2.5 mg total) by mouth daily. 90 capsule 1  . simvastatin (ZOCOR) 20 MG tablet Take 1  tablet (20 mg total) by mouth at bedtime. 90 tablet 1   No current facility-administered medications on file prior to visit.     Allergies  Allergen Reactions  . Codeine Nausea And Vomiting    Family History  Problem Relation Age of Onset  . Lung cancer Father   . Coronary artery disease Mother        stent  . Diabetes Brother   . Prostate cancer Maternal Uncle        in his 57s  . Stroke Son        GF  . Throat cancer Son   . Colon cancer Neg Hx   . Rectal cancer Neg Hx   . Stomach cancer Neg Hx   . Esophageal cancer Neg Hx   . Pancreatic cancer Neg Hx     BP 124/62 (BP Location: Left Arm, Patient Position: Sitting, Cuff Size: Normal)   Pulse (!) 54   Wt 149 lb 9.6 oz (67.9 kg)   SpO2 97%   BMI 21.47 kg/m   Review of Systems Denies LOC    Objective:   Physical Exam VITAL SIGNS:  See vs page GENERAL: no distress Pulses: dorsalis pedis intact bilat.   MSK: no deformity of the feet CV: 2+ bilat leg edema Skin:  no ulcer on the feet.  normal color and temp on the feet. Neuro: sensation is intact to touch on the feet  A1c=8.6%     Assessment & Plan:  Type 1 DM: The pattern of his cbg's indicates he needs some adjustment in his therapy.  Patient Instructions  check your blood sugar 4 times a day.  vary the time of day when you check, between before the 3 meals, and at bedtime.  also check if you have symptoms of your blood sugar being too high or too low.  please keep a record of the readings and bring it to your next appointment here (or you can bring the meter itself).  You can write it on any piece of paper.  please call us sooner if your blood sugar goes below 70, or if you have a lot of readings over 200.   For now, please decrease the basaglar to 6 units at bedtime, and increase the humalog to 3 times a day (just before each meal), 05-21-09 units Please stop taking the onglyza, and:  Please come back for a follow-up appointment in 2 months.

## 2018-01-27 ENCOUNTER — Encounter: Payer: Self-pay | Admitting: Internal Medicine

## 2018-01-27 ENCOUNTER — Ambulatory Visit (INDEPENDENT_AMBULATORY_CARE_PROVIDER_SITE_OTHER): Payer: Medicare Other | Admitting: Internal Medicine

## 2018-01-27 VITALS — BP 116/68 | HR 61 | Temp 97.9°F | Resp 14 | Ht 70.0 in | Wt 150.5 lb

## 2018-01-27 DIAGNOSIS — K8689 Other specified diseases of pancreas: Secondary | ICD-10-CM

## 2018-01-27 DIAGNOSIS — E538 Deficiency of other specified B group vitamins: Secondary | ICD-10-CM | POA: Diagnosis not present

## 2018-01-27 DIAGNOSIS — D649 Anemia, unspecified: Secondary | ICD-10-CM | POA: Diagnosis not present

## 2018-01-27 DIAGNOSIS — E038 Other specified hypothyroidism: Secondary | ICD-10-CM

## 2018-01-27 DIAGNOSIS — I10 Essential (primary) hypertension: Secondary | ICD-10-CM | POA: Diagnosis not present

## 2018-01-27 DIAGNOSIS — R634 Abnormal weight loss: Secondary | ICD-10-CM | POA: Diagnosis not present

## 2018-01-27 LAB — TSH: TSH: 0.93 u[IU]/mL (ref 0.35–4.50)

## 2018-01-27 LAB — VITAMIN B12: Vitamin B-12: 300 pg/mL (ref 211–911)

## 2018-01-27 LAB — FERRITIN: Ferritin: 29.8 ng/mL (ref 22.0–322.0)

## 2018-01-27 LAB — IRON: Iron: 36 ug/dL — ABNORMAL LOW (ref 42–165)

## 2018-01-27 NOTE — Progress Notes (Signed)
Subjective:    Patient ID: Ethan Mcneil, male    DOB: [birth year], 84 y.o.   MRN: [id number]  DOS:  01/27/2018 Type of visit - description : Routine visit Interval history: No major concerns. No ambulatory BPs CBGs very good, this morning was 86, other readings are in the low 100s. On B12 supplements OTC On iron by mouth, not taking it B.I.D. rather once daily. Has gained some weight  Wt Readings from Last 3 Encounters:  01/27/18 150 lb 8 oz (68.3 kg)  01/20/18 149 lb 9.6 oz (67.9 kg)  11/18/17 145 lb 6.4 oz (66 kg)     Review of Systems No lower extremity paresthesias. No nausea or vomiting  Past Medical History:  Diagnosis Date  . Arthritis   . CAD (coronary artery disease)    CABG 1997; grafts patent @ cath [facility] 11/09  . Chronic kidney disease   . DM2 (diabetes mellitus, type 2) ([place])   . Gout    after tick bite  . HLD (hyperlipidemia)   . HTN (hypertension)    essential nos  . Hypothyroid   . Postsurgical aortocoronary bypass status   . PVD (peripheral vascular disease) ([place])   . S/P herniorrhaphy   . Tick fever 2012   [facility] , [place]  . Unspecified hyperplasia of prostate without urinary obstruction and other lower urinary tract symptoms (LUTS)   . Urolithiasis 2015   hx of    Past Surgical History:  Procedure Laterality Date  . CORONARY ARTERY BYPASS GRAFT     x7 vessels 1997; cath 2002 and 2009; grafts patent   . CYSTOSCOPY WITH RETROGRADE PYELOGRAM, URETEROSCOPY AND STENT PLACEMENT Right 02/21/2014   Procedure: CYSTOSCOPY WITH RIGHT  RETROGRADE PYELOGRAM,RIGHT  URETEROSCOPY WITH STONE BASKETING EXTRACTION;  Surgeon: Irine Seal, MD;  Location: [facility];  Service: Urology;  Laterality: Right;  . INGUINAL HERNIA REPAIR    . SHOULDER SURGERY      Social History   Socioeconomic History  . Marital status: Married    Spouse name: [name]  . Number of children: 4  . Years of education: Not on file  . Highest education level: Not on file    Social Needs  . Financial resource strain: Not on file  . Food insecurity - worry: Not on file  . Food insecurity - inability: Not on file  . Transportation needs - medical: Not on file  . Transportation needs - non-medical: Not on file  Occupational History  . Occupation: retired, works few hours   Tobacco Use  . Smoking status: Never Smoker  . Smokeless tobacco: Never Used  Substance and Sexual Activity  . Alcohol use: No  . Drug use: No  . Sexual activity: Not on file  Other Topics Concern  . 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 Always use your most recent med list.          aspirin EC 81 MG tablet Take 81 mg by mouth daily.   augmented betamethasone dipropionate 0.05 % cream  Commonly known as:  DIPROLENE-AF   B-12 1000 MCG Tabs Take 1,000 mcg by mouth daily.   BASAGLAR KWIKPEN 100 UNIT/ML Sopn Inject 0.06 mLs (6 Units total) into the skin at bedtime.   ferrous sulfate 325 (65 FE) MG tablet Take 1 tablet (325 mg total) by mouth daily with breakfast.   glucose blood test strip Commonly known as:  FREESTYLE TEST STRIPS CHECK BLOOD SUGAR NO MORE THAN TWICE DAILY.   insulin lispro 100 UNIT/ML KiwkPen Commonly known as:  HUMALOG KWIKPEN 3 times a day (just before each meal), 05-21-09 units, and pen needles 4/day   Insulin Pen Needle 31G X 5 MM Misc To use w/ Basaglar   levothyroxine 125 MCG tablet Commonly known as:  SYNTHROID, LEVOTHROID Take 1 tablet (125 mcg total) by mouth daily before breakfast.   metoprolol succinate 25 MG 24 hr tablet Commonly known as:  TOPROL-XL Take 0.5 tablets (12.5 mg total) by mouth daily.   Pancrelipase  (Lip-Prot-Amyl) 25000 units Cpep Commonly known as:  ZENPEP Take 2 with meals and snacks   ramipril 2.5 MG capsule Commonly known as:  ALTACE Take 1 capsule (2.5 mg total) by mouth daily.   simvastatin 20 MG tablet Commonly known as:  ZOCOR Take 1 tablet (20 mg total) by mouth at bedtime.          Objective:   Physical Exam BP 116/68 (BP Location: Left Arm, Patient Position: Sitting, Cuff Size: Small)   Pulse 61   Temp 97.9 F (36.6 C) (Oral)   Resp 14   Ht 5\' 10"  (1.778 m)   Wt 150 lb 8 oz (68.3 kg)   SpO2 93%   BMI 21.59 kg/m  General:   Well developed, well nourished . NAD.  HEENT:  Normocephalic . Face symmetric, atraumatic Lungs:  CTA B Normal respiratory effort, no intercostal retractions, no accessory muscle use. Heart: RRR,  no murmur.  No pretibial edema bilaterally  DIABETIC FEET EXAM: + Left lower extremity edema at baseline Normal pedal pulses bilaterally Skin normal, nails normal, no calluses Pinprick examination of the feet normal. Neurologic:  alert & oriented X3.  Speech normal, gait appropriate for age and unassisted Psych--  Cognition and judgment appear intact.  Cooperative with normal attention span and concentration.  Behavior appropriate. No anxious or depressed appearing.      Assessment & Plan:    Assessment DM -- w/ CAD, no neuropathy, intolerant to metformin  HTN Hyperlipidemia Hypothyroidism DJD BPH, LUTS,h/o urolithiasis -- sees urology CV: --CAD, CABG 1997, cardiac cath 2009 --Peripheral vascular disease (no ABIs in the chart) --L leg edema, chronic, (-) DVT per [place] 2015 Gout GI: Pancreatic insufficiency dx 02-2017 Anemia: Mild, normal iron 2015 B12 deficiency, mild 2015  Wt loss:  2012 187 lb, 2014 173 lb , 2015 167 01-2017: EGD normal, BX chronic active gastritis. Colonoscopy normal. Saw GI 02-2017: likely d/t Pancreatic insufficiency and poorly controlled diabetes. Wt better w/ pancreat enzymes  Liver  lesion per  Ct, liver MRI 05-2017 benign, no further workup  H/o Tick fever 2012   PLAN: DM: Under better control, per Endo;  feet exam negative, due for eye exam: states he will call, decline referral. HTN: Seems controlled on Altace and Toprol.  Last BMP satisfactory Hypothyroidism: On Synthroid, check a TSH B12 deficiency: On OTCs, recommend 1000 mg daily.  Checking labs Mild iron deficiency: On supplements, only taking one iron daily.  Check levels. Wt loss: Improving, likely a combination of pancreatic insufficiency treatment and  better DM control Pancreatic insufficiency: Zenpep  cost is an issue, I spoke w/ the pharmacist, she will see if CREON is less expensive under his insurance and send me a Rx request if it is.  RTC 6 months

## 2018-01-27 NOTE — Progress Notes (Signed)
Pre visit review using our clinic review tool, if applicable. No additional management support is needed unless otherwise documented below in the visit note. 

## 2018-01-27 NOTE — Patient Instructions (Addendum)
GO TO THE LAB : Get the blood work     GO TO THE FRONT DESK Schedule your next appointment for a routine checkup in 6 months  Continue taking iron supplements 1 daily  Be sure you take a B12 supplement, 1000 mcg every day  Please see your eye doctor

## 2018-01-29 NOTE — Assessment & Plan Note (Signed)
DM: Under better control, per Endo;  feet exam negative, due for eye exam: states he will call, decline referral. HTN: Seems controlled on Altace and Toprol.  Last BMP satisfactory Hypothyroidism: On Synthroid, check a TSH B12 deficiency: On OTCs, recommend 1000 mg daily.  Checking labs Mild iron deficiency: On supplements, only taking one iron daily.  Check levels. Wt loss: Improving, likely a combination of pancreatic insufficiency treatment and better DM control Pancreatic insufficiency: Zenpep  cost is an issue, I spoke w/ the pharmacist, she will see if CREON is less expensive under his insurance and send me a Rx request if it is.  RTC 6 months

## 2018-02-19 ENCOUNTER — Other Ambulatory Visit: Payer: Self-pay | Admitting: Internal Medicine

## 2018-02-21 ENCOUNTER — Other Ambulatory Visit: Payer: Self-pay | Admitting: Internal Medicine

## 2018-02-24 DIAGNOSIS — Z794 Long term (current) use of insulin: Secondary | ICD-10-CM | POA: Diagnosis not present

## 2018-02-24 DIAGNOSIS — E119 Type 2 diabetes mellitus without complications: Secondary | ICD-10-CM | POA: Diagnosis not present

## 2018-02-24 DIAGNOSIS — H2513 Age-related nuclear cataract, bilateral: Secondary | ICD-10-CM | POA: Diagnosis not present

## 2018-02-24 LAB — HM DIABETES EYE EXAM

## 2018-02-27 ENCOUNTER — Other Ambulatory Visit: Payer: Self-pay

## 2018-02-27 ENCOUNTER — Telehealth: Payer: Self-pay | Admitting: Endocrinology

## 2018-02-27 MED ORDER — INSULIN PEN NEEDLE 31G X 5 MM MISC: 5 refills | Status: DC

## 2018-02-27 NOTE — Telephone Encounter (Signed)
I have sent to patient pharmacy.

## 2018-02-27 NOTE — Telephone Encounter (Signed)
Insulin Pen Needle 31G X 5 MM [place] states they need an approval from Dr to fill this prescription.     [id number] - [place], [place]

## 2018-03-20 ENCOUNTER — Ambulatory Visit: Payer: Medicare Other | Admitting: Endocrinology

## 2018-04-03 ENCOUNTER — Ambulatory Visit: Payer: Medicare Other | Admitting: Endocrinology

## 2018-04-05 ENCOUNTER — Other Ambulatory Visit: Payer: Self-pay | Admitting: Endocrinology

## 2018-04-15 ENCOUNTER — Encounter: Payer: Self-pay | Admitting: Endocrinology

## 2018-04-15 ENCOUNTER — Ambulatory Visit (INDEPENDENT_AMBULATORY_CARE_PROVIDER_SITE_OTHER): Payer: Medicare Other | Admitting: Endocrinology

## 2018-04-15 VITALS — BP 128/58 | HR 95 | Wt 154.0 lb

## 2018-04-15 DIAGNOSIS — E1165 Type 2 diabetes mellitus with hyperglycemia: Secondary | ICD-10-CM | POA: Diagnosis not present

## 2018-04-15 DIAGNOSIS — IMO0002 Reserved for concepts with insufficient information to code with codable children: Secondary | ICD-10-CM

## 2018-04-15 DIAGNOSIS — E1151 Type 2 diabetes mellitus with diabetic peripheral angiopathy without gangrene: Secondary | ICD-10-CM | POA: Diagnosis not present

## 2018-04-15 LAB — POCT GLYCOSYLATED HEMOGLOBIN (HGB A1C): Hemoglobin A1C: 6.8

## 2018-04-15 MED ORDER — INSULIN LISPRO 100 UNIT/ML (KWIKPEN): PEN_INJECTOR | SUBCUTANEOUS | 11 refills | Status: DC

## 2018-04-15 NOTE — Progress Notes (Signed)
Subjective:    Patient ID: Ethan Mcneil, male    DOB: 08/09/1934, 84 y.o.   MRN: [id number]  HPI Pt returns for f/u of diabetes mellitus: DM type: 1 Dx'ed: 4696 Complications: polyneuropathy, renal insuff, CAD, and PAD Therapy: insulin since 2017 DKA: never Severe hypoglycemia: never Pancreatitis: once (2017) Pancreatic imaging: chronic atrophy with diffuse pancreatic duct dilatation.   Other: he takes multiple daily injections; he declines pump.  Interval history: He seldom has hypoglycemia, and these episodes are mild.  This happens after supper.  pt states he feels well in general.   Past Medical History:  Diagnosis Date  . Arthritis   . CAD (coronary artery disease)    CABG 1997; grafts patent @ cath [facility] 11/09  . Chronic kidney disease   . DM2 (diabetes mellitus, type 2) ([place])   . Gout    after tick bite  . HLD (hyperlipidemia)   . HTN (hypertension)    essential nos  . Hypothyroid   . Postsurgical aortocoronary bypass status   . PVD (peripheral vascular disease) ([place])   . S/P herniorrhaphy   . Tick fever 2012   [facility] , [place]  . Unspecified hyperplasia of prostate without urinary obstruction and other lower urinary tract symptoms (LUTS)   . Urolithiasis 2015   hx of    Past Surgical History:  Procedure Laterality Date  . CORONARY ARTERY BYPASS GRAFT     x7 vessels 1997; cath 2002 and 2009; grafts patent   . CYSTOSCOPY WITH RETROGRADE PYELOGRAM, URETEROSCOPY AND STENT PLACEMENT Right 02/21/2014   Procedure: CYSTOSCOPY WITH RIGHT  RETROGRADE PYELOGRAM,RIGHT  URETEROSCOPY WITH STONE BASKETING EXTRACTION;  Surgeon: Irine Seal, MD;  Location: [facility];  Service: Urology;  Laterality: Right;  . INGUINAL HERNIA REPAIR    . SHOULDER SURGERY      Social History   Socioeconomic History  . Marital status: Married    Spouse name: [name]  . Number of children: 4  . Years of education: Not on file  . Highest education level: Not on file  Occupational  History  . Occupation: retired, works few hours   Social Needs  . Financial resource strain: Not on file  . Food insecurity:    Worry: Not on file    Inability: Not on file  . Transportation needs:    Medical: Not on file    Non-medical: Not on file  Tobacco Use  . Smoking status: Never Smoker  . Smokeless tobacco: Never Used  Substance and Sexual Activity  . Alcohol use: No  . Drug use: No  . Sexual activity: Not on file  Lifestyle  . Physical activity:    Days per week: Not on file    Minutes per session: Not on file  . Stress: Not on file  Relationships  . Social connections:    Talks on phone: Not on file    Gets together: Not on file    Attends religious service: Not on file    Active member of club or organization: Not on file    Attends meetings of clubs or organizations: Not on file    Relationship status: Not on file  . Intimate partner violence:    Fear of current or ex partner: Not on file    Emotionally abused: Not on file    Physically abused: Not on file    Forced sexual activity: Not on file  Other Topics Concern  . Not on file  Social History Narrative  Buyer, retail and former race Nutritional therapist   4 children (boys)   Designated party form signed appointing wife - [name]; ok to leave msg on home phone [phone]          Current Outpatient Medications on File Prior to Visit  Medication Sig Dispense Refill  . aspirin EC 81 MG tablet Take 81 mg by mouth daily.    Marland Kitchen augmented betamethasone dipropionate (DIPROLENE-AF) 0.05 % cream     . Cyanocobalamin (B-12) 1000 MCG TABS Take 1,000 mcg by mouth daily.    . ferrous sulfate 325 (65 FE) MG tablet Take 1 tablet (325 mg total) by mouth daily with breakfast.    . glucose blood (FREESTYLE TEST STRIPS) test strip CHECK BLOOD SUGAR NO MORE THAN TWICE DAILY. 200 each 3  . Insulin Glargine (BASAGLAR KWIKPEN) 100 UNIT/ML SOPN Inject 0.06 mLs (6 Units total) into the skin at bedtime. 15 mL 2  . Insulin Pen  Needle 31G X 5 MM MISC To use w/ Basaglar 100 each 5  . levothyroxine (SYNTHROID, LEVOTHROID) 125 MCG tablet Take 1 tablet (125 mcg total) by mouth daily before breakfast. 90 tablet 1  . metoprolol succinate (TOPROL-XL) 25 MG 24 hr tablet Take 0.5 tablets (12.5 mg total) by mouth daily. 45 tablet 2  . ONGLYZA 5 MG TABS tablet TAKE 1 TABLET BY MOUTH EVERY DAY (Patient not taking: Reported on 04/15/2018) 90 tablet 0  . Pancrelipase, Lip-Prot-Amyl, (ZENPEP) 25000 units CPEP Take 2 with meals and snacks 810 capsule 3  . ramipril (ALTACE) 2.5 MG capsule Take 1 capsule (2.5 mg total) by mouth daily. 90 capsule 2  . simvastatin (ZOCOR) 20 MG tablet Take 1 tablet (20 mg total) by mouth at bedtime. 90 tablet 1   No current facility-administered medications on file prior to visit.     Allergies  Allergen Reactions  . Codeine Nausea And Vomiting    Family History  Problem Relation Age of Onset  . Lung cancer Father   . Coronary artery disease Mother        stent  . Diabetes Brother   . Prostate cancer Maternal Uncle        in his 49s  . Stroke Son        GF  . Throat cancer Son   . Colon cancer Neg Hx   . Rectal cancer Neg Hx   . Stomach cancer Neg Hx   . Esophageal cancer Neg Hx   . Pancreatic cancer Neg Hx     BP (!) 128/58   Pulse 95   Wt 154 lb (69.9 kg)   SpO2 96%   BMI 22.10 kg/m    Review of Systems Denies LOC    Objective:   Physical Exam VITAL SIGNS:  See vs page GENERAL: no distress Pulses: foot pulses are intact bilaterally.   MSK: no deformity of the feet or ankles.  CV: 2+ of the left leg, and 1+ on the right.   Skin:  no ulcer on the feet or ankles.  normal color and temp on the feet and ankles Neuro: sensation is intact to touch on the feet and ankles.    Lab Results  Component Value Date   HGBA1C 6.8 04/15/2018   Lab Results  Component Value Date   CREATININE 1.30 09/16/2017   BUN 13 09/16/2017   NA 138 09/16/2017   K 3.8 09/16/2017   CL 105  09/16/2017   CO2 30 09/16/2017  Assessment & Plan:  Type 1 DM, with PAD: slightly overcontrolled  Patient Instructions  check your blood sugar 4 times a day.  vary the time of day when you check, between before the 3 meals, and at bedtime.  also check if you have symptoms of your blood sugar being too high or too low.  please keep a record of the readings and bring it to your next appointment here (or you can bring the meter itself).  You can write it on any piece of paper.  please call us sooner if your blood sugar goes below 70, or if you have a lot of readings over 200.   For now, Please continue the same basaglar, and decrease the humalog to 3 times a day (just before each meal), 6-6-9 units.    Please come back for a follow-up appointment in 4 months.

## 2018-04-15 NOTE — Patient Instructions (Addendum)
check your blood sugar 4 times a day.  vary the time of day when you check, between before the 3 meals, and at bedtime.  also check if you have symptoms of your blood sugar being too high or too low.  please keep a record of the readings and bring it to your next appointment here (or you can bring the meter itself).  You can write it on any piece of paper.  please call us sooner if your blood sugar goes below 70, or if you have a lot of readings over 200.   For now, Please continue the same basaglar, and decrease the humalog to 3 times a day (just before each meal), 6-6-9 units.    Please come back for a follow-up appointment in 4 months.

## 2018-05-14 NOTE — Progress Notes (Addendum)
Subjective:   Ethan Mcneil is a 84 y.o. male who presents for Medicare Annual/Subsequent preventive examination.  Pt works on restoring old cars as a hobby.  Review of Systems: No ROS.  Medicare Wellness Visit. Additional risk factors are reflected in the social history.    Sleep patterns: Sleeps 8 hrs. Feels rested. 10p-6a Home Safety/Smoke Alarms: Feels safe in home. Smoke alarms in place.  Living environment; residence and Firearm Safety: Lives with wife in 1 story home w/ basement.   Eye: yearly diabetic eye exam [facility]Hagar [facility]  Male:   CCS-   utd  PSA-  Lab Results  Component Value Date   PSA 1.19 05/02/2010   PSA 1.06 03/24/2009   PSA 1.13 11/03/2007       Objective:    Vitals: BP (!) 120/58 (BP Location: Left Arm, Patient Position: Sitting, Cuff Size: Normal)   Pulse (!) 53   Ht 5\' 10"  (1.778 m)   Wt 151 lb 12.8 oz (68.9 kg)   SpO2 98%   BMI 21.78 kg/m   Body mass index is 21.78 kg/m.  Advanced Directives 05/18/2018 05/15/2017 08/08/2014 08/02/2014 07/08/2014 02/21/2014  Does Patient Have a Medical Advance Directive? No Yes Yes Yes Patient would like information;Patient would not like information Patient has advance directive, copy not in chart  Type of Advance Directive - [place];Living will [place];Living will [place];Living will - [place]  Does patient want to make changes to medical advance directive? - - No - Patient declined - - -  Copy of [place] in Chart? - No - copy requested No - copy requested No - copy requested - Copy requested from other (Comment)  Would patient like information on creating a medical advance directive? Yes (MAU/Ambulatory/Procedural Areas - Information given) - - - - -    Tobacco Social History   Tobacco Use  Smoking Status Never Smoker  Smokeless Tobacco Never Used     Counseling given: Not Answered   Clinical  Intake: Pain : No/denies pain    Past Medical History:  Diagnosis Date  . Arthritis   . CAD (coronary artery disease)    CABG 1997; grafts patent @ cath [facility] 11/09  . Chronic kidney disease   . DM2 (diabetes mellitus, type 2) ([place])   . Gout    after tick bite  . HLD (hyperlipidemia)   . HTN (hypertension)    essential nos  . Hypothyroid   . Postsurgical aortocoronary bypass status   . PVD (peripheral vascular disease) ([place])   . S/P herniorrhaphy   . Tick fever 2012   [facility] [place]  . Unspecified hyperplasia of prostate without urinary obstruction and other lower urinary tract symptoms (LUTS)   . Urolithiasis 2015   hx of   Past Surgical History:  Procedure Laterality Date  . CORONARY ARTERY BYPASS GRAFT     x7 vessels 1997; cath 2002 and 2009; grafts patent   . CYSTOSCOPY WITH RETROGRADE PYELOGRAM, URETEROSCOPY AND STENT PLACEMENT Right 02/21/2014   Procedure: CYSTOSCOPY WITH RIGHT  RETROGRADE PYELOGRAM,RIGHT  URETEROSCOPY WITH STONE BASKETING EXTRACTION;  Surgeon: Irine Seal, MD;  Location: [facility];  Service: Urology;  Laterality: Right;  . INGUINAL HERNIA REPAIR    . SHOULDER SURGERY     Family History  Problem Relation Age of Onset  . Lung cancer Father   . Coronary artery disease Mother  stent  . Diabetes Brother   . Prostate cancer Maternal Uncle        in his 60s  . Stroke Son        GF  . Throat cancer Son   . Colon cancer Neg Hx   . Rectal cancer Neg Hx   . Stomach cancer Neg Hx   . Esophageal cancer Neg Hx   . Pancreatic cancer Neg Hx    Social History   Socioeconomic History  . Marital status: Married    Spouse name: [name]  . Number of children: 4  . Years of education: Not on file  . Highest education level: Not on file  Occupational History  . Occupation: retired, works few hours   Social Needs  . Financial resource strain: Not on file  . Food insecurity:    Worry: Not on file    Inability: Not on file  .  Transportation needs:    Medical: Not on file    Non-medical: Not on file  Tobacco Use  . Smoking status: Never Smoker  . Smokeless tobacco: Never Used  Substance and Sexual Activity  . Alcohol use: No  . Drug use: No  . Sexual activity: Not Currently  Lifestyle  . Physical activity:    Days per week: Not on file    Minutes per session: Not on file  . Stress: Not on file  Relationships  . Social connections:    Talks on phone: Not on file    Gets together: Not on file    Attends religious service: Not on file    Active member of club or organization: Not on file    Attends meetings of clubs or organizations: Not on file    Relationship status: Not on file  Other Topics Concern  . Not on file  Social History Narrative   Buyer, retail and former race car driver   4 children (boys)   Designated party form signed appointing wife - [name]; ok to leave msg on home phone [phone]          Outpatient Encounter Medications as of 05/18/2018  Medication Sig  . aspirin EC 81 MG tablet Take 81 mg by mouth daily.  . Cyanocobalamin (B-12) 1000 MCG TABS Take 1,000 mcg by mouth daily.  . ferrous sulfate 325 (65 FE) MG tablet Take 1 tablet (325 mg total) by mouth daily with breakfast.  . glucose blood (FREESTYLE TEST STRIPS) test strip CHECK BLOOD SUGAR NO MORE THAN TWICE DAILY.  Marland Kitchen Insulin Glargine (BASAGLAR KWIKPEN) 100 UNIT/ML SOPN Inject 0.06 mLs (6 Units total) into the skin at bedtime.  . insulin lispro (HUMALOG KWIKPEN) 100 UNIT/ML KiwkPen 3 times a day (just before each meal), 6-6-9 units, and pen needles 4/day  . Insulin Pen Needle 31G X 5 MM MISC To use w/ Basaglar  . levothyroxine (SYNTHROID, LEVOTHROID) 125 MCG tablet Take 1 tablet (125 mcg total) by mouth daily before breakfast.  . metoprolol succinate (TOPROL-XL) 25 MG 24 hr tablet Take 0.5 tablets (12.5 mg total) by mouth daily.  . Pancrelipase, Lip-Prot-Amyl, (ZENPEP) 25000 units CPEP Take 2 with meals and snacks    . ramipril (ALTACE) 2.5 MG capsule Take 1 capsule (2.5 mg total) by mouth daily.  . simvastatin (ZOCOR) 20 MG tablet Take 1 tablet (20 mg total) by mouth at bedtime.  Marland Kitchen augmented betamethasone dipropionate (DIPROLENE-AF) 0.05 % cream   . [DISCONTINUED] ONGLYZA 5 MG TABS tablet TAKE 1 TABLET BY MOUTH EVERY  DAY (Patient not taking: Reported on 04/15/2018)   No facility-administered encounter medications on file as of 05/18/2018.     Activities of Daily Living In your present state of health, do you have any difficulty performing the following activities: 05/18/2018  Hearing? N  Vision? N  Comment wears reading glasses.  Difficulty concentrating or making decisions? N  Walking or climbing stairs? N  Dressing or bathing? N  Doing errands, shopping? N  Preparing Food and eating ? N  Using the Toilet? N  In the past six months, have you accidently leaked urine? N  Do you have problems with loss of bowel control? N  Managing your Medications? N  Managing your Finances? N  Housekeeping or managing your Housekeeping? N  Some recent data might be hidden    Patient Care Team: Colon Branch, MD as PCP - General (Internal Medicine) Festus Aloe, MD as Consulting Physician (Urology) Josue Hector, MD as Consulting Physician (Cardiology) Irene Shipper, MD as Consulting Physician (Gastroenterology) Renato Shin, MD as Consulting Physician (Endocrinology)   Assessment:   This is a routine wellness examination for [place]. Physical assessment deferred to PCP.  Exercise Activities and Dietary recommendations Current Exercise Habits: The patient does not participate in regular exercise at present, Exercise limited by: None identified Diet (meal preparation, eat out, water intake, caffeinated beverages, dairy products, fruits and vegetables): in general, a "healthy" diet  , well balanced Breakfast: egg biscuit. Lunch: hamburger steak, salad, and potatoes Dinner:  Corn and potatoes and tomato  sandwich    Goals    . Maintain current health and good blood sugar levels       Fall Risk Fall Risk  05/18/2018 05/15/2017 01/06/2017 09/04/2016 05/23/2016  Falls in the past year? No No No No No    Depression Screen PHQ 2/9 Scores 05/18/2018 05/15/2017 01/06/2017 09/04/2016  PHQ - 2 Score 0 0 0 0    Cognitive Function MMSE - Mini Mental State Exam 05/18/2018 05/15/2017  Orientation to time 5 5  Orientation to Place 5 5  Registration 3 3  Attention/ Calculation 5 4  Recall 3 2  Language- name 2 objects 2 2  Language- repeat 1 0  Language- follow 3 step command 3 3  Language- read & follow direction 1 1  Write a sentence 1 1  Copy design 1 1  Total score 30 27        Immunization History  Administered Date(s) Administered  . Influenza Split 10/29/2011, 11/04/2012  . Influenza Whole 11/03/2007, 10/09/2009  . Influenza, High Dose Seasonal PF 11/23/2015, 09/04/2016, 09/16/2017  . Influenza,inj,Quad PF,6+ Mos 10/10/2014  . Pneumococcal Conjugate-13 11/23/2015  . Pneumococcal Polysaccharide-23 05/10/2010  . Tdap 03/07/2014  . Zoster 06/07/2014    Screening Tests Health Maintenance  Topic Date Due  . OPHTHALMOLOGY EXAM  01/27/2019 (Originally 08/01/1944)  . INFLUENZA VACCINE  07/16/2018  . HEMOGLOBIN A1C  10/16/2018  . FOOT EXAM  04/16/2019  . TETANUS/TDAP  03/07/2024  . PNA vac Low Risk Adult  Completed      Plan:   Follow up with Dr.Paz as scheduled 07/27/18.  Continue to eat heart healthy diet (full of fruits, vegetables, whole grains, lean protein, water--limit salt, fat, and sugar intake) and increase physical activity as tolerated.  Continue doing brain stimulating activities (puzzles, reading, adult coloring books, staying active) to keep memory sharp.   Bring a copy of your living will and/or healthcare power of attorney to your next office visit.  I have personally reviewed and noted the following in the patient's chart:   . Medical and social  history . Use of alcohol, tobacco or illicit drugs  . Current medications and supplements . Functional ability and status . Nutritional status . Physical activity . Advanced directives . List of other physicians . Hospitalizations, surgeries, and ER visits in previous 12 months . Vitals . Screenings to include cognitive, depression, and falls . Referrals and appointments  In addition, I have reviewed and discussed with patient certain preventive protocols, quality metrics, and best practice recommendations. A written personalized care plan for preventive services as well as general preventive health recommendations were provided to patient.     Naaman Plummer [place], [place]  05/18/2018  Kathlene November, MD

## 2018-05-18 ENCOUNTER — Ambulatory Visit (INDEPENDENT_AMBULATORY_CARE_PROVIDER_SITE_OTHER): Payer: Medicare Other | Admitting: *Deleted

## 2018-05-18 ENCOUNTER — Other Ambulatory Visit: Payer: Self-pay | Admitting: Internal Medicine

## 2018-05-18 ENCOUNTER — Encounter: Payer: Self-pay | Admitting: *Deleted

## 2018-05-18 VITALS — BP 120/58 | HR 53 | Ht 70.0 in | Wt 151.8 lb

## 2018-05-18 DIAGNOSIS — Z0000 Encounter for general adult medical examination without abnormal findings: Secondary | ICD-10-CM | POA: Diagnosis not present

## 2018-05-18 NOTE — Patient Instructions (Signed)
Great to see you again!!  Please schedule your next medicare wellness visit with me in 1 yr.   Follow up with Dr.Paz as scheduled 07/27/18.  Continue to eat heart healthy diet (full of fruits, vegetables, whole grains, lean protein, water--limit salt, fat, and sugar intake) and increase physical activity as tolerated.  Continue doing brain stimulating activities (puzzles, reading, adult coloring books, staying active) to keep memory sharp.   Bring a copy of your living will and/or healthcare power of attorney to your next office visit.   Ethan Mcneil , Thank you for taking time to come for your Medicare Wellness Visit. I appreciate your ongoing commitment to your health goals. Please review the following plan we discussed and let me know if I can assist you in the future.   These are the goals we discussed: Goals    . Maintain current health and good blood sugar levels       This is a list of the screening recommended for you and due dates:  Health Maintenance  Topic Date Due  . Eye exam for diabetics  01/27/2019*  . Flu Shot  07/16/2018  . Hemoglobin A1C  10/16/2018  . Complete foot exam   04/16/2019  . Tetanus Vaccine  03/07/2024  . Pneumonia vaccines  Completed  *Topic was postponed. The date shown is not the original due date.

## 2018-05-21 DIAGNOSIS — T1501XA Foreign body in cornea, right eye, initial encounter: Secondary | ICD-10-CM | POA: Diagnosis not present

## 2018-06-09 ENCOUNTER — Other Ambulatory Visit: Payer: Self-pay | Admitting: Internal Medicine

## 2018-07-27 ENCOUNTER — Ambulatory Visit: Payer: Medicare Other | Admitting: Internal Medicine

## 2018-08-06 ENCOUNTER — Ambulatory Visit (INDEPENDENT_AMBULATORY_CARE_PROVIDER_SITE_OTHER): Payer: Medicare Other | Admitting: Internal Medicine

## 2018-08-06 ENCOUNTER — Encounter: Payer: Self-pay | Admitting: Internal Medicine

## 2018-08-06 VITALS — BP 118/60 | HR 54 | Temp 97.6°F | Resp 16 | Ht 70.0 in | Wt 149.0 lb

## 2018-08-06 DIAGNOSIS — E1165 Type 2 diabetes mellitus with hyperglycemia: Secondary | ICD-10-CM | POA: Diagnosis not present

## 2018-08-06 DIAGNOSIS — E1151 Type 2 diabetes mellitus with diabetic peripheral angiopathy without gangrene: Secondary | ICD-10-CM

## 2018-08-06 DIAGNOSIS — E782 Mixed hyperlipidemia: Secondary | ICD-10-CM | POA: Diagnosis not present

## 2018-08-06 DIAGNOSIS — I10 Essential (primary) hypertension: Secondary | ICD-10-CM

## 2018-08-06 DIAGNOSIS — D649 Anemia, unspecified: Secondary | ICD-10-CM | POA: Diagnosis not present

## 2018-08-06 DIAGNOSIS — IMO0002 Reserved for concepts with insufficient information to code with codable children: Secondary | ICD-10-CM

## 2018-08-06 LAB — IRON: Iron: 52 ug/dL (ref 42–165)

## 2018-08-06 LAB — COMPREHENSIVE METABOLIC PANEL
ALT: 12 U/L (ref 0–53)
AST: 13 U/L (ref 0–37)
Albumin: 3.1 g/dL — ABNORMAL LOW (ref 3.5–5.2)
Alkaline Phosphatase: 50 U/L (ref 39–117)
BUN: 15 mg/dL (ref 6–23)
CO2: 29 mEq/L (ref 19–32)
Calcium: 8.6 mg/dL (ref 8.4–10.5)
Chloride: 106 mEq/L (ref 96–112)
Creatinine, Ser: 1.54 mg/dL — ABNORMAL HIGH (ref 0.40–1.50)
GFR: 46.19 mL/min — ABNORMAL LOW (ref >60.00)
Glucose, Bld: 150 mg/dL — ABNORMAL HIGH (ref 70–99)
Potassium: 4.4 mEq/L (ref 3.5–5.1)
Sodium: 138 mEq/L (ref 135–145)
Total Bilirubin: 0.4 mg/dL (ref 0.2–1.2)
Total Protein: 5 g/dL — ABNORMAL LOW (ref 6.0–8.3)

## 2018-08-06 LAB — HEMOGLOBIN: Hemoglobin: 11.5 g/dL — ABNORMAL LOW (ref 13.0–17.0)

## 2018-08-06 LAB — FERRITIN: Ferritin: 35.5 ng/mL (ref 22.0–322.0)

## 2018-08-06 NOTE — Patient Instructions (Signed)
GO TO THE LAB : Get the blood work     GO TO THE FRONT DESK Schedule your next appointment for a routine checkup in 6 months, fasting

## 2018-08-06 NOTE — Assessment & Plan Note (Signed)
DM: Last A1c 6.8, Per Endo.  Doing great HTN: Continue Toprol, Altace.  Check a CMP CAD: Asymptomatic Pancreatic insufficiency: Doing well on Zenpep, only take 1 tablet daily otherwise he gets constipated. Mild iron deficiency: On supplements, checking labs Weight loss: Stabilized. RTC fasting 6 months

## 2018-08-06 NOTE — Progress Notes (Signed)
Subjective:    Patient ID: Ethan Mcneil, male    DOB: 1934/03/07, 84 y.o.   MRN: [id number]  DOS:  08/06/2018 Type of visit - description : rov Interval history: No major concerns, in general feels well. Good compliance with medications No ambulatory BPs  Wt Readings from Last 3 Encounters:  08/06/18 149 lb (67.6 kg)  05/18/18 151 lb 12.8 oz (68.9 kg)  04/15/18 154 lb (69.9 kg)      Review of Systems Denies chest pain or difficulty breathing No nausea, vomiting, blood in the stools.   Past Medical History:  Diagnosis Date  . Arthritis   . CAD (coronary artery disease)    CABG 1997; grafts patent @ cath [facility] 11/09  . Chronic kidney disease   . DM2 (diabetes mellitus, type 2) ([place])   . Gout    after tick bite  . HLD (hyperlipidemia)   . HTN (hypertension)    essential nos  . Hypothyroid   . Postsurgical aortocoronary bypass status   . PVD (peripheral vascular disease) ([place])   . S/P herniorrhaphy   . Tick fever 2012   [facility] , [place]  . Unspecified hyperplasia of prostate without urinary obstruction and other lower urinary tract symptoms (LUTS)   . Urolithiasis 2015   hx of    Past Surgical History:  Procedure Laterality Date  . CORONARY ARTERY BYPASS GRAFT     x7 vessels 1997; cath 2002 and 2009; grafts patent   . CYSTOSCOPY WITH RETROGRADE PYELOGRAM, URETEROSCOPY AND STENT PLACEMENT Right 02/21/2014   Procedure: CYSTOSCOPY WITH RIGHT  RETROGRADE PYELOGRAM,RIGHT  URETEROSCOPY WITH STONE BASKETING EXTRACTION;  Surgeon: Irine Seal, MD;  Location: [facility];  Service: Urology;  Laterality: Right;  . INGUINAL HERNIA REPAIR    . SHOULDER SURGERY      Social History   Socioeconomic History  . Marital status: Married    Spouse name: [name]  . Number of children: 4  . Years of education: Not on file  . Highest education level: Not on file  Occupational History  . Occupation: retired, works few hours   Social Needs  . Financial resource strain: Not  on file  . Food insecurity:    Worry: Not on file    Inability: Not on file  . Transportation needs:    Medical: Not on file    Non-medical: Not on file  Tobacco Use  . Smoking status: Never Smoker  . Smokeless tobacco: Never Used  Substance and Sexual Activity  . Alcohol use: No  . Drug use: No  . Sexual activity: Not Currently  Lifestyle  . Physical activity:    Days per week: Not on file    Minutes per session: Not on file  . Stress: Not on file  Relationships  . Social connections:    Talks on phone: Not on file    Gets together: Not on file    Attends religious service: Not on file    Active member of club or organization: Not on file    Attends meetings of clubs or organizations: Not on file    Relationship status: Not on file  . Intimate partner violence:    Fear of current or ex partner: Not on file    Emotionally abused: Not on file    Physically abused: Not on file    Forced sexual activity: Not on file  Other Topics Concern  . Not on file  Social History Narrative   Dealer; aviator  and former race car driver   4 children (boys)   Designated party form signed appointing wife - [name]; ok to leave msg on home phone [phone]            Allergies as of 08/06/2018      Reactions   Codeine Nausea And Vomiting      Medication List        Accurate as of 08/06/18  2:57 PM. Always use your most recent med list.          aspirin EC 81 MG tablet Take 81 mg by mouth daily.   augmented betamethasone dipropionate 0.05 % cream Commonly known as:  DIPROLENE-AF   B-12 1000 MCG Tabs Take 1,000 mcg by mouth daily.   BASAGLAR KWIKPEN 100 UNIT/ML Sopn Inject 0.06 mLs (6 Units total) into the skin at bedtime.   ferrous sulfate 325 (65 FE) MG tablet Take 1 tablet (325 mg total) by mouth daily with breakfast.   glucose blood test strip CHECK BLOOD SUGAR NO MORE THAN TWICE DAILY.   insulin lispro 100 UNIT/ML KiwkPen Commonly known as:  HUMALOG 3  times a day (just before each meal), 6-6-9 units, and pen needles 4/day   Insulin Pen Needle 31G X 5 MM Misc To use w/ Basaglar   levothyroxine 125 MCG tablet Commonly known as:  SYNTHROID, LEVOTHROID Take 1 tablet (125 mcg total) by mouth daily before breakfast.   metoprolol succinate 25 MG 24 hr tablet Commonly known as:  TOPROL-XL Take 0.5 tablets (12.5 mg total) by mouth daily.   Pancrelipase (Lip-Prot-Amyl) 25000 units Cpep Take 2 with meals and snacks   ramipril 2.5 MG capsule Commonly known as:  ALTACE Take 1 capsule (2.5 mg total) by mouth daily.   simvastatin 20 MG tablet Commonly known as:  ZOCOR Take 1 tablet (20 mg total) by mouth at bedtime.          Objective:   Physical Exam BP 118/60 (BP Location: Right Arm, Patient Position: Sitting, Cuff Size: Normal)   Pulse (!) 54   Temp 97.6 F (36.4 C) (Oral)   Resp 16   Ht 5\' 10"  (1.778 m)   Wt 149 lb (67.6 kg)   SpO2 98%   BMI 21.38 kg/m  General:   Well developed, well nourished . NAD.  HEENT:  Normocephalic . Face symmetric, atraumatic Lungs:  CTA B Normal respiratory effort, no intercostal retractions, no accessory muscle use. Heart: RRR,  no murmur.  No pretibial edema bilaterally   Neurologic:  alert & oriented X3.  Speech normal, gait appropriate for age and unassisted Psych--  Cognition and judgment appear intact.  Cooperative with normal attention span and concentration.  Behavior appropriate. No anxious or depressed appearing.      Assessment & Plan:   Assessment DM -- w/ CAD, no neuropathy, intolerant to metformin  HTN Hyperlipidemia Hypothyroidism DJD BPH, LUTS,h/o urolithiasis -- sees urology CV: --CAD, CABG 1997, cardiac cath 2009 --Peripheral vascular disease (no ABIs in the chart) --L leg edema, chronic, (-) DVT per [place] 2015 Gout GI: Pancreatic insufficiency dx 02-2017 Anemia: Mild, normal iron 2015 B12 deficiency, mild 2015  Wt loss:  2012 187 lb, 2014 173 lb , 2015  167 01-2017: EGD normal, BX chronic active gastritis. Colonoscopy normal. Saw GI 02-2017: likely d/t Pancreatic insufficiency and poorly controlled diabetes. Wt better w/ pancreat enzymes  Liver  lesion per Ct, liver MRI 05-2017 benign, no further workup  H/o Tick fever 2012   PLAN:  DM: Last A1c 6.8, Per Endo.  Doing great HTN: Continue Toprol, Altace.  Check a CMP CAD: Asymptomatic Pancreatic insufficiency: Doing well on Zenpep, only take 1 tablet daily otherwise he gets constipated. Mild iron deficiency: On supplements, checking labs Weight loss: Stabilized. RTC fasting 6 months

## 2018-08-18 ENCOUNTER — Encounter: Payer: Self-pay | Admitting: Endocrinology

## 2018-08-18 ENCOUNTER — Ambulatory Visit (INDEPENDENT_AMBULATORY_CARE_PROVIDER_SITE_OTHER): Payer: Medicare Other | Admitting: Endocrinology

## 2018-08-18 VITALS — BP 122/60 | HR 65 | Ht 70.0 in | Wt 147.4 lb

## 2018-08-18 DIAGNOSIS — E1151 Type 2 diabetes mellitus with diabetic peripheral angiopathy without gangrene: Secondary | ICD-10-CM

## 2018-08-18 DIAGNOSIS — E1165 Type 2 diabetes mellitus with hyperglycemia: Secondary | ICD-10-CM | POA: Diagnosis not present

## 2018-08-18 DIAGNOSIS — IMO0002 Reserved for concepts with insufficient information to code with codable children: Secondary | ICD-10-CM

## 2018-08-18 LAB — POCT GLYCOSYLATED HEMOGLOBIN (HGB A1C): Hemoglobin A1C: 6.7 % — AB (ref 4.0–5.6)

## 2018-08-18 MED ORDER — BASAGLAR KWIKPEN 100 UNIT/ML ~~LOC~~ SOPN: 2.0000 [IU] | PEN_INJECTOR | Freq: Every day | SUBCUTANEOUS | 2 refills | Status: DC

## 2018-08-18 NOTE — Patient Instructions (Addendum)
check your blood sugar 4 times a day.  vary the time of day when you check, between before the 3 meals, and at bedtime.  also check if you have symptoms of your blood sugar being too high or too low.  please keep a record of the readings and bring it to your next appointment here (or you can bring the meter itself).  You can write it on any piece of paper.  please call us sooner if your blood sugar goes below 70, or if you have a lot of readings over 200.   For now, please reduce the basaglar to 2 units at bedtime, and continue the same humalog: 3 times a day (just before each meal), 6-6-9 units.    Please come back for a follow-up appointment in 4 months.

## 2018-08-18 NOTE — Progress Notes (Signed)
Subjective:    Patient ID: Ethan Mcneil, male    DOB: 11/01/1934, 84 y.o.   MRN: [id number]  HPI Pt returns for f/u of diabetes mellitus: DM type: 1 Dx'ed: 4098 Complications: polyneuropathy, renal insuff, CAD, and PAD Therapy: insulin since 2017 DKA: never Severe hypoglycemia: never Pancreatitis: once (2017) Pancreatic imaging: chronic atrophy with diffuse pancreatic duct dilatation.   Other: he takes multiple daily injections; he declines pump.  Interval history: Meter is downloaded today, and the printout is scanned into the record.  cbg varies from 70-240.  Most are checked fasting.  pt states he feels well in general.  He says he had a cbg of 50, at 4 AM (not on downloaded record).   Past Medical History:  Diagnosis Date  . Arthritis   . CAD (coronary artery disease)    CABG 1997; grafts patent @ cath [facility] 11/09  . Chronic kidney disease   . DM2 (diabetes mellitus, type 2) ([place])   . Gout    after tick bite  . HLD (hyperlipidemia)   . HTN (hypertension)    essential nos  . Hypothyroid   . Postsurgical aortocoronary bypass status   . PVD (peripheral vascular disease) ([place])   . S/P herniorrhaphy   . Tick fever 2012   [facility] , [place]  . Unspecified hyperplasia of prostate without urinary obstruction and other lower urinary tract symptoms (LUTS)   . Urolithiasis 2015   hx of    Past Surgical History:  Procedure Laterality Date  . CORONARY ARTERY BYPASS GRAFT     x7 vessels 1997; cath 2002 and 2009; grafts patent   . CYSTOSCOPY WITH RETROGRADE PYELOGRAM, URETEROSCOPY AND STENT PLACEMENT Right 02/21/2014   Procedure: CYSTOSCOPY WITH RIGHT  RETROGRADE PYELOGRAM,RIGHT  URETEROSCOPY WITH STONE BASKETING EXTRACTION;  Surgeon: Irine Seal, MD;  Location: [facility];  Service: Urology;  Laterality: Right;  . INGUINAL HERNIA REPAIR    . SHOULDER SURGERY      Social History   Socioeconomic History  . Marital status: Married    Spouse name: [name]  . Number of  children: 4  . Years of education: Not on file  . Highest education level: Not on file  Occupational History  . Occupation: retired, works few hours   Social Needs  . Financial resource strain: Not on file  . Food insecurity:    Worry: Not on file    Inability: Not on file  . Transportation needs:    Medical: Not on file    Non-medical: Not on file  Tobacco Use  . Smoking status: Never Smoker  . Smokeless tobacco: Never Used  Substance and Sexual Activity  . Alcohol use: No  . Drug use: No  . Sexual activity: Not Currently  Lifestyle  . Physical activity:    Days per week: Not on file    Minutes per session: Not on file  . Stress: Not on file  Relationships  . Social connections:    Talks on phone: Not on file    Gets together: Not on file    Attends religious service: Not on file    Active member of club or organization: Not on file    Attends meetings of clubs or organizations: Not on file    Relationship status: Not on file  . Intimate partner violence:    Fear of current or ex partner: Not on file    Emotionally abused: Not on file    Physically abused: Not on  file    Forced sexual activity: Not on file  Other Topics Concern  . Not on file  Social History Narrative   Buyer, retail and former race car driver   4 children (boys)   Designated party form signed appointing wife - [name]; ok to leave msg on home phone [phone]          Current Outpatient Medications on File Prior to Visit  Medication Sig Dispense Refill  . aspirin EC 81 MG tablet Take 81 mg by mouth daily.    Marland Kitchen augmented betamethasone dipropionate (DIPROLENE-AF) 0.05 % cream     . Cyanocobalamin (B-12) 1000 MCG TABS Take 1,000 mcg by mouth daily.    . ferrous sulfate 325 (65 FE) MG tablet Take 1 tablet (325 mg total) by mouth daily with breakfast.    . glucose blood (FREESTYLE TEST STRIPS) test strip CHECK BLOOD SUGAR NO MORE THAN TWICE DAILY. 200 each 3  . insulin lispro (HUMALOG  KWIKPEN) 100 UNIT/ML KiwkPen 3 times a day (just before each meal), 6-6-9 units, and pen needles 4/day 15 mL 11  . Insulin Pen Needle 31G X 5 MM MISC To use w/ Basaglar 100 each 5  . levothyroxine (SYNTHROID, LEVOTHROID) 125 MCG tablet Take 1 tablet (125 mcg total) by mouth daily before breakfast. 90 tablet 1  . metoprolol succinate (TOPROL-XL) 25 MG 24 hr tablet Take 0.5 tablets (12.5 mg total) by mouth daily. 45 tablet 2  . Pancrelipase, Lip-Prot-Amyl, (ZENPEP) 25000 units CPEP Take 2 with meals and snacks 810 capsule 3  . ramipril (ALTACE) 2.5 MG capsule Take 1 capsule (2.5 mg total) by mouth daily. 90 capsule 2  . simvastatin (ZOCOR) 20 MG tablet Take 1 tablet (20 mg total) by mouth at bedtime. 90 tablet 1   No current facility-administered medications on file prior to visit.     Allergies  Allergen Reactions  . Codeine Nausea And Vomiting    Family History  Problem Relation Age of Onset  . Lung cancer Father   . Coronary artery disease Mother        stent  . Diabetes Brother   . Prostate cancer Maternal Uncle        in his 28s  . Stroke Son        GF  . Throat cancer Son   . Colon cancer Neg Hx   . Rectal cancer Neg Hx   . Stomach cancer Neg Hx   . Esophageal cancer Neg Hx   . Pancreatic cancer Neg Hx     BP 122/60   Pulse 65   Ht 5\' 10"  (1.778 m)   Wt 147 lb 6.4 oz (66.9 kg)   SpO2 97%   BMI 21.15 kg/m    Review of Systems Denies LOC    Objective:   Physical Exam VITAL SIGNS:  See vs page GENERAL: no distress Pulses: foot pulses are intact bilaterally.   MSK: no deformity of the feet or ankles.  CV: 1+ edema the left leg, and none on the right.   Skin:  no ulcer on the feet or ankles.  normal color and temp on the feet and ankles Neuro: sensation is intact to touch on the feet and ankles.    Lab Results  Component Value Date   HGBA1C 6.7 (A) 08/18/2018   Lab Results  Component Value Date   CREATININE 1.54 (H) 08/06/2018   BUN 15 08/06/2018   NA  138 08/06/2018   K 4.4 08/06/2018  CL 106 08/06/2018   CO2 29 08/06/2018      Assessment & Plan:  Type 1 DM, with PAD: overcontrolled.   Hypoglycemia: we are choosing the basaglar for reduction.   Renal insuff: in this setting, he may not need basal insulin at all.    Patient Instructions  check your blood sugar 4 times a day.  vary the time of day when you check, between before the 3 meals, and at bedtime.  also check if you have symptoms of your blood sugar being too high or too low.  please keep a record of the readings and bring it to your next appointment here (or you can bring the meter itself).  You can write it on any piece of paper.  please call us sooner if your blood sugar goes below 70, or if you have a lot of readings over 200.   For now, please reduce the basaglar to 2 units at bedtime, and continue the same humalog: 3 times a day (just before each meal), 6-6-9 units.    Please come back for a follow-up appointment in 4 months.

## 2018-09-03 ENCOUNTER — Other Ambulatory Visit: Payer: Self-pay | Admitting: Internal Medicine

## 2018-09-23 DIAGNOSIS — T1501XA Foreign body in cornea, right eye, initial encounter: Secondary | ICD-10-CM | POA: Diagnosis not present

## 2018-11-23 ENCOUNTER — Other Ambulatory Visit: Payer: Self-pay | Admitting: Internal Medicine

## 2018-11-30 ENCOUNTER — Other Ambulatory Visit: Payer: Self-pay | Admitting: Internal Medicine

## 2018-12-15 ENCOUNTER — Other Ambulatory Visit: Payer: Self-pay | Admitting: Internal Medicine

## 2018-12-21 ENCOUNTER — Ambulatory Visit (INDEPENDENT_AMBULATORY_CARE_PROVIDER_SITE_OTHER): Payer: Medicare Other | Admitting: Endocrinology

## 2018-12-21 ENCOUNTER — Encounter: Payer: Self-pay | Admitting: Endocrinology

## 2018-12-21 VITALS — BP 122/64 | HR 86 | Ht 70.0 in | Wt 142.6 lb

## 2018-12-21 DIAGNOSIS — IMO0002 Reserved for concepts with insufficient information to code with codable children: Secondary | ICD-10-CM

## 2018-12-21 DIAGNOSIS — E1151 Type 2 diabetes mellitus with diabetic peripheral angiopathy without gangrene: Secondary | ICD-10-CM | POA: Diagnosis not present

## 2018-12-21 DIAGNOSIS — E1165 Type 2 diabetes mellitus with hyperglycemia: Secondary | ICD-10-CM

## 2018-12-21 LAB — POCT GLYCOSYLATED HEMOGLOBIN (HGB A1C): Hemoglobin A1C: 7.1 % — AB (ref 4.0–5.6)

## 2018-12-21 NOTE — Progress Notes (Signed)
Subjective:    Patient ID: Ethan Mcneil, male    DOB: [birth year], 85 y.o.   MRN: [id number]  HPI Pt returns for f/u of diabetes mellitus: DM type: pancreatic insuff. Dx'ed: 1779 Complications: polyneuropathy, renal insuff, CAD, and PAD.   Therapy: insulin since 2017 DKA: never Severe hypoglycemia: never Pancreatitis: once (2017) Pancreatic imaging: chronic atrophy with diffuse pancreatic duct dilatation.   Other: he takes multiple daily injections; he declines pump.  Interval history: no cbg record, but states cbg's are well-controlled.  He checks fasting only.  pt states he feels well in general.  He has not recently taken basaglar.   Past Medical History:  Diagnosis Date  . Arthritis   . CAD (coronary artery disease)    CABG 1997; grafts patent @ cath [facility] 11/09  . Chronic kidney disease   . DM2 (diabetes mellitus, type 2) ([place])   . Gout    after tick bite  . HLD (hyperlipidemia)   . HTN (hypertension)    essential nos  . Hypothyroid   . Postsurgical aortocoronary bypass status   . PVD (peripheral vascular disease) ([place])   . S/P herniorrhaphy   . Tick fever 2012   [facility] , [place]  . Unspecified hyperplasia of prostate without urinary obstruction and other lower urinary tract symptoms (LUTS)   . Urolithiasis 2015   hx of    Past Surgical History:  Procedure Laterality Date  . CORONARY ARTERY BYPASS GRAFT     x7 vessels 1997; cath 2002 and 2009; grafts patent   . CYSTOSCOPY WITH RETROGRADE PYELOGRAM, URETEROSCOPY AND STENT PLACEMENT Right 02/21/2014   Procedure: CYSTOSCOPY WITH RIGHT  RETROGRADE PYELOGRAM,RIGHT  URETEROSCOPY WITH STONE BASKETING EXTRACTION;  Surgeon: Irine Seal, MD;  Location: [facility];  Service: Urology;  Laterality: Right;  . INGUINAL HERNIA REPAIR    . SHOULDER SURGERY      Social History   Socioeconomic History  . Marital status: Married    Spouse name: [name]  . Number of children: 4  . Years of education: Not on file  .  Highest education level: Not on file  Occupational History  . Occupation: retired, works few hours   Social Needs  . Financial resource strain: Not on file  . Food insecurity:    Worry: Not on file    Inability: Not on file  . Transportation needs:    Medical: Not on file    Non-medical: Not on file  Tobacco Use  . Smoking status: Never Smoker  . Smokeless tobacco: Never Used  Substance and Sexual Activity  . Alcohol use: No  . Drug use: No  . Sexual activity: Not Currently  Lifestyle  . Physical activity:    Days per week: Not on file    Minutes per session: Not on file  . Stress: Not on file  Relationships  . Social connections:    Talks on phone: Not on file    Gets together: Not on file    Attends religious service: Not on file    Active member of club or organization: Not on file    Attends meetings of clubs or organizations: Not on file    Relationship status: Not on file  . Intimate partner violence:    Fear of current or ex partner: Not on file    Emotionally abused: Not on file    Physically abused: Not on file    Forced sexual activity: Not on file  Other Topics Concern  .  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 aspirin EC 81 MG tablet Take 81 mg by mouth daily.    Marland Kitchen augmented betamethasone dipropionate (DIPROLENE-AF) 0.05 % cream     . Cyanocobalamin (B-12) 1000 MCG TABS Take 1,000 mcg by mouth daily.    . ferrous sulfate 325 (65 FE) MG tablet Take 1 tablet (325 mg total) by mouth daily with breakfast.    . glucose blood (FREESTYLE TEST STRIPS) test strip CHECK BLOOD SUGAR NO MORE THAN TWICE DAILY. 200 each 3  . insulin lispro (HUMALOG KWIKPEN) 100 UNIT/ML KiwkPen 3 times a day (just before  each meal), 6-6-9 units, and pen needles 4/day 15 mL 11  . Insulin Pen Needle 31G X 5 MM MISC To use w/ Basaglar 100 each 5  . levothyroxine (SYNTHROID, LEVOTHROID) 125 MCG tablet Take 1 tablet (125 mcg total) by mouth daily before breakfast. 90 tablet 0  . metoprolol succinate (TOPROL-XL) 25 MG 24 hr tablet Take 0.5 tablets (12.5 mg total) by mouth daily. 45 tablet 1  . Pancrelipase, Lip-Prot-Amyl, (ZENPEP) 25000 units CPEP Take 2 with meals and snacks 810 capsule 3  . ramipril (ALTACE) 2.5 MG capsule Take 1 capsule (2.5 mg total) by mouth daily. 90 capsule 2  . simvastatin (ZOCOR) 20 MG tablet Take 1 tablet (20 mg total) by mouth at bedtime. 90 tablet 1  . Insulin Glargine (BASAGLAR KWIKPEN) 100 UNIT/ML SOPN Inject 0.02 mLs (2 Units total) into the skin at bedtime. (Patient not taking: Reported on 12/21/2018) 15 mL 2   No current facility-administered medications on file prior to visit.     Allergies  Allergen Reactions  . Codeine Nausea And Vomiting    Family History  Problem Relation Age of Onset  . Lung cancer Father   . Coronary artery disease Mother        stent  . Diabetes Brother   . Prostate cancer Maternal Uncle        in his 19s  . Stroke Son        GF  . Throat cancer Son   . Colon cancer Neg Hx   . Rectal cancer Neg Hx   . Stomach cancer Neg Hx   . Esophageal cancer Neg Hx   . Pancreatic cancer Neg Hx     BP 122/64 (BP Location: Left Arm, Patient Position: Sitting, Cuff Size: Normal)   Pulse 86   Ht 5\' 10"  (1.778 m)   Wt 142 lb 9.6 oz (64.7 kg)   SpO2 94%   BMI 20.46 kg/m   Review of Systems He denies hypoglycemia.      Objective:   Physical Exam VITAL SIGNS:  See vs page GENERAL: no distress Pulses: foot pulses are intact bilaterally.   MSK: no deformity of the feet or ankles.  CV: 2+ edema the left leg, and none on the right.   Skin:  no ulcer on the feet or ankles.  normal color and temp on the feet and ankles Neuro: sensation is intact to touch on  the feet and ankles.   Lab Results  Component Value Date   CREATININE 1.54 (H) 08/06/2018   BUN 15 08/06/2018  NA 138 08/06/2018   K 4.4 08/06/2018   CL 106 08/06/2018   CO2 29 08/06/2018     A1c=7.1%    Assessment & Plan:  DM, due to pancreatic insuff, with PAD: well-controlled.   Renal insuff: this is probably why he does not seem to need basaglar.   Edema, worse: this limits rx options.  CAD: in this context, he should avoid hypoglycemia.    Patient Instructions  check your blood sugar 4 times a day.  vary the time of day when you check, between before the 3 meals, and at bedtime.  also check if you have symptoms of your blood sugar being too high or too low.  please keep a record of the readings and bring it to your next appointment here (or you can bring the meter itself).  You can write it on any piece of paper.  please call us sooner if your blood sugar goes below 70, or if you have a lot of readings over 200.   You can stay off the basaglar, and: continue the same humalog: 3 times a day (just before each meal), 6-6-9 units.    Please come back for a follow-up appointment in 4 months.

## 2018-12-21 NOTE — Patient Instructions (Addendum)
check your blood sugar 4 times a day.  vary the time of day when you check, between before the 3 meals, and at bedtime.  also check if you have symptoms of your blood sugar being too high or too low.  please keep a record of the readings and bring it to your next appointment here (or you can bring the meter itself).  You can write it on any piece of paper.  please call us sooner if your blood sugar goes below 70, or if you have a lot of readings over 200.   You can stay off the basaglar, and:  continue the same humalog: 3 times a day (just before each meal), 6-6-9 units.  Please come back for a follow-up appointment in 4 months.

## 2019-02-08 ENCOUNTER — Encounter: Payer: Self-pay | Admitting: Internal Medicine

## 2019-02-08 ENCOUNTER — Ambulatory Visit (INDEPENDENT_AMBULATORY_CARE_PROVIDER_SITE_OTHER): Payer: Medicare Other | Admitting: Internal Medicine

## 2019-02-08 VITALS — BP 118/66 | HR 55 | Temp 97.9°F | Resp 16 | Ht 70.0 in | Wt 146.2 lb

## 2019-02-08 DIAGNOSIS — D649 Anemia, unspecified: Secondary | ICD-10-CM | POA: Diagnosis not present

## 2019-02-08 DIAGNOSIS — E038 Other specified hypothyroidism: Secondary | ICD-10-CM

## 2019-02-08 DIAGNOSIS — I2583 Coronary atherosclerosis due to lipid rich plaque: Secondary | ICD-10-CM

## 2019-02-08 DIAGNOSIS — I10 Essential (primary) hypertension: Secondary | ICD-10-CM | POA: Diagnosis not present

## 2019-02-08 DIAGNOSIS — E782 Mixed hyperlipidemia: Secondary | ICD-10-CM

## 2019-02-08 DIAGNOSIS — I2510 Atherosclerotic heart disease of native coronary artery without angina pectoris: Secondary | ICD-10-CM | POA: Diagnosis not present

## 2019-02-08 DIAGNOSIS — K8689 Other specified diseases of pancreas: Secondary | ICD-10-CM | POA: Diagnosis not present

## 2019-02-08 LAB — CBC WITH DIFFERENTIAL/PLATELET
Basophils Absolute: 0 10*3/uL (ref 0.0–0.1)
Basophils Relative: 0.5 % (ref 0.0–3.0)
Eosinophils Absolute: 0.5 10*3/uL (ref 0.0–0.7)
Eosinophils Relative: 8.3 % — ABNORMAL HIGH (ref 0.0–5.0)
HCT: 36.9 % — ABNORMAL LOW (ref 39.0–52.0)
Hemoglobin: 12.2 g/dL — ABNORMAL LOW (ref 13.0–17.0)
Lymphocytes Relative: 24.1 % (ref 12.0–46.0)
Lymphs Abs: 1.6 10*3/uL (ref 0.7–4.0)
MCHC: 33.1 g/dL (ref 30.0–36.0)
MCV: 92.4 fl (ref 78.0–100.0)
Monocytes Absolute: 0.6 10*3/uL (ref 0.1–1.0)
Monocytes Relative: 9 % (ref 3.0–12.0)
Neutro Abs: 3.8 10*3/uL (ref 1.4–7.7)
Neutrophils Relative %: 58.1 % (ref 43.0–77.0)
Platelets: 217 10*3/uL (ref 150.0–400.0)
RBC: 3.99 Mil/uL — ABNORMAL LOW (ref 4.22–5.81)
RDW: 14.8 % (ref 11.5–15.5)
WBC: 6.6 10*3/uL (ref 4.0–10.5)

## 2019-02-08 LAB — BASIC METABOLIC PANEL
BUN: 13 mg/dL (ref 6–23)
CO2: 29 mEq/L (ref 19–32)
Calcium: 7.8 mg/dL — ABNORMAL LOW (ref 8.4–10.5)
Chloride: 104 mEq/L (ref 96–112)
Creatinine, Ser: 1.47 mg/dL (ref 0.40–1.50)
GFR: 45.8 mL/min — ABNORMAL LOW (ref >60.00)
Glucose, Bld: 161 mg/dL — ABNORMAL HIGH (ref 70–99)
Potassium: 4.6 mEq/L (ref 3.5–5.1)
Sodium: 137 mEq/L (ref 135–145)

## 2019-02-08 LAB — LIPID PANEL
Cholesterol: 116 mg/dL (ref 0–200)
HDL: 49.5 mg/dL (ref >39.00)
LDL Cholesterol: 47 mg/dL (ref 0–99)
NonHDL: 66.04
Total CHOL/HDL Ratio: 2
Triglycerides: 95 mg/dL (ref 0.0–149.0)
VLDL: 19 mg/dL (ref 0.0–40.0)

## 2019-02-08 LAB — TSH: TSH: 1.69 u[IU]/mL (ref 0.35–4.50)

## 2019-02-08 MED ORDER — ZOSTER VAC RECOMB ADJUVANTED 50 MCG/0.5ML IM SUSR: 0.5000 mL | Freq: Once | INTRAMUSCULAR | 1 refills | Status: AC

## 2019-02-08 NOTE — Patient Instructions (Addendum)
Per our records you are due for an eye exam. Please contact your eye doctor to schedule an appointment. Please have them send copies of your office visit notes to [place]. Our fax number is [phone].   GO TO THE LAB : Get the blood work     GO TO THE FRONT DESK Schedule your next appointment in 6 to 8 months

## 2019-02-08 NOTE — Progress Notes (Signed)
Pre visit review using our clinic review tool, if applicable. No additional management support is needed unless otherwise documented below in the visit note. 

## 2019-02-08 NOTE — Progress Notes (Signed)
Subjective:    Patient ID: Ethan Mcneil, male    DOB: February 10, 1934, 85 y.o.   MRN: [id number]  DOS:  02/08/2019 Type of visit - description: rov Here for routine visit.  No major concerns CAD: Good med compliance, has not seen cardiology in years. Gout: No recent events HTN: Good med compliance, no ambulatory BPs Bilateral leg swelling: Worse on the left, swelling is stable, has noted to have varicose veins and the left calf, seems to be slightly more noticeable lately but not tender or red.  Wt Readings from Last 3 Encounters:  02/08/19 146 lb 4 oz (66.3 kg)  12/21/18 142 lb 9.6 oz (64.7 kg)  08/18/18 147 lb 6.4 oz (66.9 kg)    Review of Systems Denies chest pain or difficulty breathing No nausea, vomiting, diarrhea.  No blood in the stools.  No anxiety or depression.  Past Medical History:  Diagnosis Date  . Arthritis   . CAD (coronary artery disease)    CABG 1997; grafts patent @ cath [facility] 11/09  . Chronic kidney disease   . DM2 (diabetes mellitus, type 2) ([place])   . Gout    after tick bite  . HLD (hyperlipidemia)   . HTN (hypertension)    essential nos  . Hypothyroid   . Postsurgical aortocoronary bypass status   . PVD (peripheral vascular disease) ([place])   . S/P herniorrhaphy   . Tick fever 2012   [facility] , [place]  . Unspecified hyperplasia of prostate without urinary obstruction and other lower urinary tract symptoms (LUTS)   . Urolithiasis 2015   hx of    Past Surgical History:  Procedure Laterality Date  . CORONARY ARTERY BYPASS GRAFT     x7 vessels 1997; cath 2002 and 2009; grafts patent   . CYSTOSCOPY WITH RETROGRADE PYELOGRAM, URETEROSCOPY AND STENT PLACEMENT Right 02/21/2014   Procedure: CYSTOSCOPY WITH RIGHT  RETROGRADE PYELOGRAM,RIGHT  URETEROSCOPY WITH STONE BASKETING EXTRACTION;  Surgeon: Irine Seal, MD;  Location: [facility];  Service: Urology;  Laterality: Right;  . INGUINAL HERNIA REPAIR    . SHOULDER SURGERY      Social History    Socioeconomic History  . Marital status: Married    Spouse name: [name]  . Number of children: 4  . Years of education: Not on file  . Highest education level: Not on file  Occupational History  . Occupation: retired, works few hours   Social Needs  . Financial resource strain: Not on file  . Food insecurity:    Worry: Not on file    Inability: Not on file  . Transportation needs:    Medical: Not on file    Non-medical: Not on file  Tobacco Use  . Smoking status: Never Smoker  . Smokeless tobacco: Never Used  Substance and Sexual Activity  . Alcohol use: No  . Drug use: No  . Sexual activity: Not Currently  Lifestyle  . Physical activity:    Days per week: Not on file    Minutes per session: Not on file  . Stress: Not on file  Relationships  . Social connections:    Talks on phone: Not on file    Gets together: Not on file    Attends religious service: Not on file    Active member of club or organization: Not on file    Attends meetings of clubs or organizations: Not on file    Relationship status: Not on file  . Intimate partner violence:  Fear of current or ex partner: Not on file    Emotionally abused: Not on file    Physically abused: Not on file    Forced sexual activity: Not on file  Other Topics Concern  . Not on file  Social History Narrative   Buyer, retail and former race car driver   4 children (boys)   Designated party form signed appointing wife - [name]; ok to leave msg on home phone [phone]            Allergies as of 02/08/2019      Reactions   Codeine Nausea And Vomiting      Medication List       Accurate as of February 08, 2019 12:44 PM. Always use your most recent med list.        aspirin EC 81 MG tablet Take 81 mg by mouth daily.   augmented betamethasone dipropionate 0.05 % cream Commonly known as:  DIPROLENE-AF   B-12 1000 MCG Tabs Take 1,000 mcg by mouth daily.   ferrous sulfate 325 (65 FE) MG tablet Take  1 tablet (325 mg total) by mouth daily with breakfast.   glucose blood test strip Commonly known as:  FREESTYLE TEST STRIPS CHECK BLOOD SUGAR NO MORE THAN TWICE DAILY.   insulin lispro 100 UNIT/ML KiwkPen Commonly known as:  HUMALOG KWIKPEN 3 times a day (just before each meal), 6-6-9 units, and pen needles 4/day   Insulin Pen Needle 31G X 5 MM Misc To use w/ Basaglar   levothyroxine 125 MCG tablet Commonly known as:  SYNTHROID, LEVOTHROID Take 1 tablet (125 mcg total) by mouth daily before breakfast.   metoprolol succinate 25 MG 24 hr tablet Commonly known as:  TOPROL-XL Take 0.5 tablets (12.5 mg total) by mouth daily.   Pancrelipase (Lip-Prot-Amyl) 25000 units Cpep Commonly known as:  ZENPEP Take 2 with meals and snacks   ramipril 2.5 MG capsule Commonly known as:  ALTACE Take 1 capsule (2.5 mg total) by mouth daily.   simvastatin 20 MG tablet Commonly known as:  ZOCOR Take 1 tablet (20 mg total) by mouth at bedtime.   Zoster Vaccine Adjuvanted injection Commonly known as:  SHINGRIX Inject 0.5 mLs into the muscle once for 1 dose.           Objective:   Physical Exam Musculoskeletal:       Legs:    BP 118/66 (BP Location: Left Arm, Patient Position: Sitting, Cuff Size: Small)   Pulse (!) 55   Temp 97.9 F (36.6 C) (Oral)   Resp 16   Ht 5\' 10"  (1.778 m)   Wt 146 lb 4 oz (66.3 kg)   SpO2 98%   BMI 20.98 kg/m  General:   Well developed, NAD, BMI noted.  HEENT:  Normocephalic . Face symmetric, atraumatic Lungs:  CTA B Normal respiratory effort, no intercostal retractions, no accessory muscle use. Heart: RRR,  no murmur.  +/+++ Pitting edema up to midway his pretibial area, slightly more noticeable on the left.  Not tender.   Abdomen:  Not distended, soft, non-tender. No rebound or rigidity.  No bruit. Skin: Not pale. Not jaundice Neurologic:  alert & oriented X3.  Speech normal, gait appropriate for age and unassisted Psych--  Cognition and  judgment appear intact.  Cooperative with normal attention span and concentration.  Behavior appropriate. No anxious or depressed appearing.     Assessment     Assessment DM -- w/ CAD, no neuropathy, intolerant to metformin  HTN Hyperlipidemia Hypothyroidism DJD BPH, LUTS,h/o urolithiasis -- sees urology CV: --CAD, CABG 1997, cardiac cath 2009 --Peripheral vascular disease (no ABIs in the chart) --L leg edema, chronic, (-) DVT per [place] 2015 Gout GI: Pancreatic insufficiency dx 02-2017 Anemia: Mild, normal iron 2015 B12 deficiency, mild 2015  Wt loss:  2012 187 lb, 2014 173 lb , 2015 167 01-2017: EGD normal, BX chronic active gastritis. Colonoscopy normal. Saw GI 02-2017: likely d/t Pancreatic insufficiency and poorly controlled diabetes. Wt better w/ pancreat enzymes  Liver  lesion per Ct, liver MRI 05-2017 benign, no further workup  H/o Tick fever 2012   PLAN: DM: Per Endo HTN: Continue metoprolol, Altace, check a BMP Hyperlipidemia: On simvastatin, last LFTs normal, check a FLP. Hypothyroidism: On Synthroid, check a TSH BPH: No symptoms at this point, has not seen urology recently. CAD: On aspirin, beta-blockers, statins.  Asymptomatic.  Has not seen cardiology in a while. EKG: No acute changes Varicose vein: Left leg, to call if evidence of phlebitis or if the chronic swelling gets worse.  Pancreatic insufficiency: Good med compliance, no weight loss. Preventive care: Shingrix printed, pros and cons discussed. RTC 6 to 8 months

## 2019-02-08 NOTE — Assessment & Plan Note (Signed)
DM: Per Endo HTN: Continue metoprolol, Altace, check a BMP Hyperlipidemia: On simvastatin, last LFTs normal, check a FLP. Hypothyroidism: On Synthroid, check a TSH BPH: No symptoms at this point, has not seen urology recently. CAD: On aspirin, beta-blockers, statins.  Asymptomatic.  Has not seen cardiology in a while. EKG: No acute changes Varicose vein: Left leg, to call if evidence of phlebitis or if the chronic swelling gets worse.  Pancreatic insufficiency: Good med compliance, no weight loss. Preventive care: Shingrix printed, pros and cons discussed. RTC 6 to 8 months

## 2019-02-15 NOTE — Addendum Note (Signed)
Addended byDamita Dunnings D on: 02/15/2019 01:39 PM   Modules accepted: Orders

## 2019-02-23 ENCOUNTER — Other Ambulatory Visit (INDEPENDENT_AMBULATORY_CARE_PROVIDER_SITE_OTHER): Payer: Medicare Other

## 2019-02-23 LAB — MAGNESIUM: Magnesium: 1.8 mg/dL (ref 1.5–2.5)

## 2019-02-23 LAB — VITAMIN D 25 HYDROXY (VIT D DEFICIENCY, FRACTURES): VITD: <7 ng/mL — ABNORMAL LOW (ref 30.00–100.00)

## 2019-02-23 LAB — LUTEINIZING HORMONE: LH: 6.35 m[IU]/mL (ref 3.10–34.60)

## 2019-02-24 LAB — PTH, INTACT AND CALCIUM
Calcium: 8.1 mg/dL — ABNORMAL LOW (ref 8.6–10.3)
PTH: 61 pg/mL (ref 14–64)

## 2019-02-24 LAB — CALCIUM, IONIZED: Calcium, Ion: 4.93 mg/dL (ref 4.8–5.6)

## 2019-02-28 ENCOUNTER — Other Ambulatory Visit: Payer: Self-pay | Admitting: Internal Medicine

## 2019-03-01 MED ORDER — VITAMIN D (ERGOCALCIFEROL) 1.25 MG (50000 UNIT) PO CAPS: 50000.0000 [IU] | ORAL_CAPSULE | ORAL | 3 refills | Status: DC

## 2019-03-01 NOTE — Addendum Note (Signed)
Addended byDamita Dunnings D on: 03/01/2019 01:26 PM   Modules accepted: Orders

## 2019-03-11 ENCOUNTER — Other Ambulatory Visit: Payer: Self-pay | Admitting: Internal Medicine

## 2019-04-21 ENCOUNTER — Encounter: Payer: Self-pay | Admitting: Endocrinology

## 2019-04-21 ENCOUNTER — Ambulatory Visit (INDEPENDENT_AMBULATORY_CARE_PROVIDER_SITE_OTHER): Payer: Medicare Other | Admitting: Endocrinology

## 2019-04-21 DIAGNOSIS — E1165 Type 2 diabetes mellitus with hyperglycemia: Secondary | ICD-10-CM | POA: Diagnosis not present

## 2019-04-21 DIAGNOSIS — E1151 Type 2 diabetes mellitus with diabetic peripheral angiopathy without gangrene: Secondary | ICD-10-CM

## 2019-04-21 DIAGNOSIS — IMO0002 Reserved for concepts with insufficient information to code with codable children: Secondary | ICD-10-CM

## 2019-04-21 NOTE — Progress Notes (Signed)
Subjective:    Patient ID: Ethan Mcneil, male    DOB: [birth year], 85 y.o.   MRN: [id number]  HPI  telehealth visit today via phone x 5 minutes Alternatives to telehealth are presented to this patient, and the patient agrees to the telehealth visit. Pt is advised of the cost of the visit, and agrees to this, also.   Patient is at home, and I am at the office.   Persons attending the telehealth visit: the patient and I Pt returns for f/u of diabetes mellitus: DM type: pancreatic insuff. Dx'ed: 2229 Complications: polyneuropathy, renal insuff, CAD, and PAD.   Therapy: insulin since 2017 DKA: never Severe hypoglycemia: never Pancreatitis: once (2017) Pancreatic imaging: chronic atrophy with diffuse pancreatic duct dilatation.   Other: he takes multiple daily injections; he declines pump.  Interval history: pt states cbg's vary from 100-165.  He checks fasting only.  pt states he feels well in general.  He take humalog 3 times a day (just before each meal) 6-6-8 units.   Past Medical History:  Diagnosis Date  . Arthritis   . CAD (coronary artery disease)    CABG 1997; grafts patent @ cath [facility] 11/09  . Chronic kidney disease   . DM2 (diabetes mellitus, type 2) ([place])   . Gout    after tick bite  . HLD (hyperlipidemia)   . HTN (hypertension)    essential nos  . Hypothyroid   . Postsurgical aortocoronary bypass status   . PVD (peripheral vascular disease) ([place])   . S/P herniorrhaphy   . Tick fever 2012   [facility] [place] [facility] [place]  . Unspecified hyperplasia of prostate without urinary obstruction and other lower urinary tract symptoms (LUTS)   . Urolithiasis 2015   hx of    Past Surgical History:  Procedure Laterality Date  . CORONARY ARTERY BYPASS GRAFT     x7 vessels 1997; cath 2002 and 2009; grafts patent   . CYSTOSCOPY WITH RETROGRADE PYELOGRAM, URETEROSCOPY AND STENT PLACEMENT Right 02/21/2014   Procedure: CYSTOSCOPY WITH RIGHT  RETROGRADE PYELOGRAM,RIGHT   URETEROSCOPY WITH STONE BASKETING EXTRACTION;  Surgeon: Irine Seal, MD;  Location: [facility];  Service: Urology;  Laterality: Right;  . INGUINAL HERNIA REPAIR    . SHOULDER SURGERY      Social History   Socioeconomic History  . Marital status: Married    Spouse name: [name]  . Number of children: 4  . Years of education: Not on file  . Highest education level: Not on file  Occupational History  . Occupation: retired, works few hours   Social Needs  . Financial resource strain: Not on file  . Food insecurity:    Worry: Not on file    Inability: Not on file  . Transportation needs:    Medical: Not on file    Non-medical: Not on file  Tobacco Use  . Smoking status: Never Smoker  . Smokeless tobacco: Never Used  Substance and Sexual Activity  . Alcohol use: No  . Drug use: No  . Sexual activity: Not Currently  Lifestyle  . Physical activity:    Days per week: Not on file    Minutes per session: Not on file  . Stress: Not on file  Relationships  . Social connections:    Talks on phone: Not on file    Gets together: Not on file    Attends religious service: Not on file    Active member of club or organization: Not on file  Attends meetings of clubs or organizations: Not on file    Relationship status: Not on file  . Intimate partner violence:    Fear of current or ex partner: Not on file    Emotionally abused: Not on file    Physically abused: Not on file    Forced sexual activity: Not on file  Other Topics Concern  . Not on file  Social History Narrative   Buyer, retail and former race car driver   4 children (boys)   Designated party form signed appointing wife - [name]; ok to leave msg on home phone [phone]          Current Outpatient Medications on File Prior to Visit  Medication Sig Dispense Refill  . aspirin EC 81 MG tablet Take 81 mg by mouth daily.    Marland Kitchen augmented betamethasone dipropionate (DIPROLENE-AF) 0.05 % cream     . Cyanocobalamin (B-12)  1000 MCG TABS Take 1,000 mcg by mouth daily.    . ferrous sulfate 325 (65 FE) MG tablet Take 1 tablet (325 mg total) by mouth daily with breakfast.    . glucose blood (FREESTYLE TEST STRIPS) test strip CHECK BLOOD SUGAR NO MORE THAN TWICE DAILY. 200 each 3  . Insulin Pen Needle 31G X 5 MM MISC To use w/ Basaglar 100 each 5  . levothyroxine (SYNTHROID, LEVOTHROID) 125 MCG tablet Take 1 tablet (125 mcg total) by mouth daily before breakfast. 90 tablet 1  . metoprolol succinate (TOPROL-XL) 25 MG 24 hr tablet Take 0.5 tablets (12.5 mg total) by mouth daily. 45 tablet 1  . Pancrelipase, Lip-Prot-Amyl, (ZENPEP) 25000 units CPEP Take 2 with meals and snacks 810 capsule 3  . ramipril (ALTACE) 2.5 MG capsule Take 1 capsule (2.5 mg total) by mouth daily. 90 capsule 2  . simvastatin (ZOCOR) 20 MG tablet Take 1 tablet (20 mg total) by mouth at bedtime. 90 tablet 1  . Vitamin D, Ergocalciferol, (DRISDOL) 1.25 MG (50000 UT) CAPS capsule Take 1 capsule (50,000 Units total) by mouth every 7 (seven) days. 4 capsule 3   No current facility-administered medications on file prior to visit.     Allergies  Allergen Reactions  . Codeine Nausea And Vomiting    Family History  Problem Relation Age of Onset  . Lung cancer Father   . Coronary artery disease Mother        stent  . Diabetes Brother   . Prostate cancer Maternal Uncle        in his 68s  . Stroke Son        GF  . Throat cancer Son   . Colon cancer Neg Hx   . Rectal cancer Neg Hx   . Stomach cancer Neg Hx   . Esophageal cancer Neg Hx   . Pancreatic cancer Neg Hx     Review of Systems He denies hypoglycemia    Objective:   Physical Exam      Assessment & Plan:  DM, due to pancreatic insuff, with PAD: apparently well-controlled.   Renal insuff: this is probably why he does not seem to need basaglar.   Edema, worse: this limits rx options.  Frail elderly state: This limits aggressiveness of glycemic control  check your blood sugar 4  times a day.  vary the time of day when you check, between before the 3 meals, and at bedtime.  also check if you have symptoms of your blood sugar being too high or too low.  please  keep a record of the readings and bring it to your next appointment here (or you can bring the meter itself).  You can write it on any piece of paper.  please call us sooner if your blood sugar goes below 70, or if you have a lot of readings over 200.   You can stay off the basaglar, and: continue the same humalog: 3 times a day (just before each meal), 6-6-8 units. Please come back for a follow-up appointment in 2-3 months.

## 2019-04-23 MED ORDER — INSULIN LISPRO (1 UNIT DIAL) 100 UNIT/ML (KWIKPEN): PEN_INJECTOR | SUBCUTANEOUS | 11 refills | Status: DC

## 2019-05-06 ENCOUNTER — Other Ambulatory Visit: Payer: Self-pay | Admitting: Endocrinology

## 2019-05-18 NOTE — Progress Notes (Deleted)
Subjective:   Ethan Mcneil is a 85 y.o. male who presents for an Initial Medicare Annual Wellness Visit.  Review of Systems  ***     Objective:    There were no vitals filed for this visit. There is no height or weight on file to calculate BMI.  Advanced Directives 05/18/2018 05/15/2017 08/08/2014 08/02/2014 07/08/2014 02/21/2014  Does Patient Have a Medical Advance Directive? No Yes Yes Yes Patient would like information;Patient would not like information Patient has advance directive, copy not in chart  Type of Advance Directive - [place];Living will [place];Living will ;Living will - [place]  Does patient want to make changes to medical advance directive? - - No - Patient declined - - -  Copy of [place] in Chart? - No - copy requested No - copy requested No - copy requested - Copy requested from other (Comment)  Would patient like information on creating a medical advance directive? Yes (MAU/Ambulatory/Procedural Areas - Information given) - - - - -    Current Medications (verified) Outpatient Encounter Medications as of 05/20/2019  Medication Sig  . aspirin EC 81 MG tablet Take 81 mg by mouth daily.  Marland Kitchen augmented betamethasone dipropionate (DIPROLENE-AF) 0.05 % cream   . Cyanocobalamin (B-12) 1000 MCG TABS Take 1,000 mcg by mouth daily.  . ferrous sulfate 325 (65 FE) MG tablet Take 1 tablet (325 mg total) by mouth daily with breakfast.  . glucose blood (FREESTYLE TEST STRIPS) test strip CHECK BLOOD SUGAR NO MORE THAN TWICE DAILY.  Marland Kitchen insulin lispro (HUMALOG KWIKPEN) 100 UNIT/ML KwikPen INJECT 3 TIMES A DAY (JUST BEFORE EACH MEAL), 6-6-9 UNITS, AND PEN NEEDLES 4/DAY  . Insulin Pen Needle 31G X 5 MM MISC To use w/ Basaglar  . levothyroxine (SYNTHROID, LEVOTHROID) 125 MCG tablet Take 1 tablet (125 mcg total) by mouth daily before breakfast.  . metoprolol succinate (TOPROL-XL) 25  MG 24 hr tablet Take 0.5 tablets (12.5 mg total) by mouth daily.  . Pancrelipase, Lip-Prot-Amyl, (ZENPEP) 25000 units CPEP Take 2 with meals and snacks  . ramipril (ALTACE) 2.5 MG capsule Take 1 capsule (2.5 mg total) by mouth daily.  . simvastatin (ZOCOR) 20 MG tablet Take 1 tablet (20 mg total) by mouth at bedtime.  . Vitamin D, Ergocalciferol, (DRISDOL) 1.25 MG (50000 UT) CAPS capsule Take 1 capsule (50,000 Units total) by mouth every 7 (seven) days.   No facility-administered encounter medications on file as of 05/20/2019.     Allergies (verified) Codeine   History: Past Medical History:  Diagnosis Date  . Arthritis   . CAD (coronary artery disease)    CABG 1997; grafts patent @ cath [facility] 11/09  . Chronic kidney disease   . DM2 (diabetes mellitus, type 2) ([place])   . Gout    after tick bite  . HLD (hyperlipidemia)   . HTN (hypertension)    essential nos  . Hypothyroid   . Postsurgical aortocoronary bypass status   . PVD (peripheral vascular disease) ([place])   . S/P herniorrhaphy   . Tick fever 2012   [facility] , [place]  . Unspecified hyperplasia of prostate without urinary obstruction and other lower urinary tract symptoms (LUTS)   . Urolithiasis 2015   hx of   Past Surgical History:  Procedure Laterality Date  . CORONARY ARTERY BYPASS GRAFT     x7 vessels 1997; cath 2002 and 2009; grafts patent   .  CYSTOSCOPY WITH RETROGRADE PYELOGRAM, URETEROSCOPY AND STENT PLACEMENT Right 02/21/2014   Procedure: CYSTOSCOPY WITH RIGHT  RETROGRADE PYELOGRAM,RIGHT  URETEROSCOPY WITH STONE BASKETING EXTRACTION;  Surgeon: Irine Seal, MD;  Location: [facility];  Service: Urology;  Laterality: Right;  . INGUINAL HERNIA REPAIR    . SHOULDER SURGERY     Family History  Problem Relation Age of Onset  . Lung cancer Father   . Coronary artery disease Mother        stent  . Diabetes Brother   . Prostate cancer Maternal Uncle        in his 88s  . Stroke Son        GF  . Throat cancer  Son   . Colon cancer Neg Hx   . Rectal cancer Neg Hx   . Stomach cancer Neg Hx   . Esophageal cancer Neg Hx   . Pancreatic cancer Neg Hx    Social History   Socioeconomic History  . Marital status: Married    Spouse name: [name]  . Number of children: 4  . Years of education: Not on file  . Highest education level: Not on file  Occupational History  . Occupation: retired, works few hours   Social Needs  . Financial resource strain: Not on file  . Food insecurity:    Worry: Not on file    Inability: Not on file  . Transportation needs:    Medical: Not on file    Non-medical: Not on file  Tobacco Use  . Smoking status: Never Smoker  . Smokeless tobacco: Never Used  Substance and Sexual Activity  . Alcohol use: No  . Drug use: No  . Sexual activity: Not Currently  Lifestyle  . Physical activity:    Days per week: Not on file    Minutes per session: Not on file  . Stress: Not on file  Relationships  . Social connections:    Talks on phone: Not on file    Gets together: Not on file    Attends religious service: Not on file    Active member of club or organization: Not on file    Attends meetings of clubs or organizations: Not on file    Relationship status: Not on file  Other Topics Concern  . 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 N  Vision? N  Comment wears reading glasses.  Difficulty concentrating or making decisions? N  Walking or climbing stairs? N  Dressing or bathing? N  Doing errands, shopping? N  Preparing Food and eating ? N   Using the Toilet? N  In the past six months, have you accidently leaked urine? N  Do you have problems with loss of bowel control? N  Managing your Medications? N  Managing your Finances? N  Housekeeping or managing your Housekeeping? N  Some recent data might be hidden     Immunizations and Health Maintenance Immunization History  Administered Date(s)  Administered  . Influenza Split 10/29/2011, 11/04/2012  . Influenza Whole 11/03/2007, 10/09/2009  . Influenza, Ethan Dose Seasonal PF 11/23/2015, 09/04/2016, 09/16/2017, 11/23/2018  . Influenza,inj,Quad PF,6+ Mos 10/10/2014  . Pneumococcal Conjugate-13 11/23/2015  . Pneumococcal Polysaccharide-23 05/10/2010  . Tdap 03/07/2014  . Zoster 06/07/2014   Health Maintenance Due  Topic Date Due  . OPHTHALMOLOGY EXAM  02/25/2019    Patient Care Team: Colon Branch, MD as PCP - General (Internal Medicine) Festus Aloe, MD as Consulting Physician (Urology) Josue Hector, MD as Consulting Physician (Cardiology) Irene Shipper, MD as Consulting Physician (Gastroenterology) Renato Shin, MD as Consulting Physician (Endocrinology) Phylliss Blakes, OD as Consulting Physician (Optometry)  Indicate any recent Medical Services you may have received from other than [facility] providers in the past year (date may be approximate).    Assessment:   This is a routine wellness examination for Ethan Mcneil.  Hearing/Vision screen No exam data present    Subjective:   Ethan Mcneil is a 85 y.o. male who presents for Medicare Annual (Subsequent) preventive examination.  Review of Systems:  ***       Objective:     Vitals: There were no vitals taken for this visit.  There is no height or weight on file to calculate BMI.  Advanced Directives 05/18/2018 05/15/2017 08/08/2014 08/02/2014 07/08/2014 02/21/2014  Does Patient Have a Medical Advance Directive? No Yes Yes Yes Patient would like information;Patient would not like information Patient has advance  directive, copy not in chart  Type of Advance Directive - [place];Living will [place];Living will [place];Living will - [place]  Does patient want to make changes to medical advance directive? - - No - Patient declined - - -  Copy of [place] in Chart? - No - copy requested No - copy requested No - copy requested - Copy requested from other (Comment)  Would patient like information on creating a medical advance directive? Yes (MAU/Ambulatory/Procedural Areas - Information given) - - - - -    Tobacco Social History   Tobacco Use  Smoking Status Never Smoker  Smokeless Tobacco Never Used     Counseling given: Not Answered   Clinical Intake:                       Past Medical History:  Diagnosis Date  . Arthritis   . CAD (coronary artery disease)    CABG 1997; grafts patent @ cath [facility] 11/09  . Chronic kidney disease   . DM2 (diabetes mellitus, type 2) ([place])   . Gout    after tick bite  . HLD (hyperlipidemia)   . HTN (hypertension)    essential nos  . Hypothyroid   . Postsurgical aortocoronary bypass status   . PVD (peripheral vascular disease) ([place])   . S/P herniorrhaphy   . Tick fever 2012   [facility] , [place]  . Unspecified hyperplasia of prostate without urinary obstruction and other lower urinary tract symptoms (LUTS)   . Urolithiasis 2015   hx of   Past Surgical History:  Procedure Laterality Date  . CORONARY ARTERY BYPASS GRAFT     x7 vessels 1997; cath 2002 and 2009; grafts patent   . CYSTOSCOPY WITH RETROGRADE PYELOGRAM, URETEROSCOPY AND STENT PLACEMENT Right 02/21/2014   Procedure: CYSTOSCOPY WITH RIGHT  RETROGRADE PYELOGRAM,RIGHT  URETEROSCOPY WITH STONE BASKETING EXTRACTION;  Surgeon: Irine Seal, MD;  Location[facility];  Service: Urology;  Laterality: Right;  . INGUINAL HERNIA REPAIR    . SHOULDER SURGERY     Family History   Problem Relation Age of Onset  . Lung cancer Father   . Coronary artery disease Mother        stent  . Diabetes Brother   . Prostate cancer Maternal Uncle        in his 31s  . Stroke Son        GF  . Throat cancer Son   . Colon cancer Neg Hx   . Rectal cancer Neg Hx   . Stomach cancer Neg Hx   . Esophageal cancer Neg Hx   . Pancreatic cancer Neg Hx    Social History   Socioeconomic History  . Marital status: Married    Spouse name: [name]  . Number of children: 4  . Years of education: Not on file  . Highest education level: Not on file  Occupational History  . Occupation: retired, works few hours   Social Needs  . Financial resource strain: Not on file  . Food insecurity:    Worry: Not on file    Inability: Not on file  . Transportation needs:    Medical: Not on file    Non-medical: Not on file  Tobacco Use  . Smoking status: Never Smoker  . Smokeless tobacco: Never Used  Substance and Sexual Activity  . Alcohol use: No  . Drug use: No  . Sexual activity: Not Currently  Lifestyle  . Physical activity:    Days per week: Not on file    Minutes per session: Not on file  . Stress: Not on file  Relationships  . Social connections:    Talks on phone: Not on file    Gets together: Not on file    Attends religious service: Not on file    Active member of club or organization: Not on file    Attends meetings of clubs or organizations: Not on file    Relationship status: Not on file  Other Topics Concern  . 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 aspirin EC 81 MG tablet Take 81 mg by mouth daily.  Marland Kitchen augmented betamethasone dipropionate (DIPROLENE-AF) 0.05 % cream   . Cyanocobalamin (B-12) 1000 MCG TABS Take 1,000 mcg by  mouth daily.  . ferrous sulfate 325 (65 FE) MG tablet Take 1 tablet (325 mg total) by mouth daily with breakfast.  . glucose blood (FREESTYLE TEST STRIPS) test strip CHECK BLOOD SUGAR NO MORE THAN TWICE DAILY.  Marland Kitchen insulin lispro (HUMALOG KWIKPEN) 100 UNIT/ML KwikPen INJECT 3 TIMES A DAY (JUST BEFORE EACH MEAL), 6-6-9 UNITS, AND PEN NEEDLES 4/DAY  . Insulin Pen Needle 31G X 5 MM MISC To use w/ Basaglar  . levothyroxine (SYNTHROID, LEVOTHROID) 125 MCG tablet Take 1 tablet (125 mcg total) by mouth daily before breakfast.  . metoprolol succinate (TOPROL-XL) 25 MG 24 hr tablet Take 0.5 tablets (12.5 mg total) by mouth daily.  . Pancrelipase, Lip-Prot-Amyl, (ZENPEP) 25000 units CPEP Take 2 with meals and snacks  . ramipril (ALTACE) 2.5 MG capsule Take 1 capsule (2.5 mg total) by mouth daily.  . simvastatin (ZOCOR)  20 MG tablet Take 1 tablet (20 mg total) by mouth at bedtime.  . Vitamin D, Ergocalciferol, (DRISDOL) 1.25 MG (50000 UT) CAPS capsule Take 1 capsule (50,000 Units total) by mouth every 7 (seven) days.   No facility-administered encounter medications on file as of 05/20/2019.     Activities of Daily Living In your present state of health, do you have any difficulty performing the following activities: 05/18/2018  Hearing? N  Vision? N  Comment wears reading glasses.  Difficulty concentrating or making decisions? N  Walking or climbing stairs? N  Dressing or bathing? N  Doing errands, shopping? N  Preparing Food and eating ? N  Using the Toilet? N  In the past six months, have you accidently leaked urine? N  Do you have problems with loss of bowel control? N  Managing your Medications? N  Managing your Finances? N  Housekeeping or managing your Housekeeping? N  Some recent data might be hidden    Patient Care Team: Colon Branch, MD as PCP - General (Internal Medicine) Festus Aloe, MD as Consulting Physician (Urology) Josue Hector, MD as Consulting Physician (Cardiology)  Irene Shipper, MD as Consulting Physician (Gastroenterology) Renato Shin, MD as Consulting Physician (Endocrinology) Phylliss Blakes, OD as Consulting Physician (Optometry)    Assessment:   This is a routine wellness examination for Ethan Mcneil.  Exercise Activities and Dietary recommendations    Goals    . Maintain current health and good blood sugar levels    . Maintain physical strength       Fall Risk Fall Risk  05/18/2018 05/15/2017 01/06/2017 09/04/2016 05/23/2016  Falls in the past year? No No No No No   Is the patient's home free of loose throw rugs in walkways, pet beds, electrical cords, etc?   {Blank single:19197::"yes","no"}      Grab bars in the bathroom? {Blank single:19197::"yes","no"}      Handrails on the stairs?   {Blank single:19197::"yes","no"}      Adequate lighting?   {Blank single:19197::"yes","no"}  Timed Get Up and Go performed: ***  Depression Screen PHQ 2/9 Scores 05/18/2018 05/15/2017 01/06/2017 09/04/2016  PHQ - 2 Score 0 0 0 0     Cognitive Function MMSE - Mini Mental State Exam 05/18/2018 05/15/2017  Orientation to time 5 5  Orientation to Place 5 5  Registration 3 3  Attention/ Calculation 5 4  Recall 3 2  Language- name 2 objects 2 2  Language- repeat 1 0  Language- follow 3 step command 3 3  Language- read & follow direction 1 1  Write a sentence 1 1  Copy design 1 1  Total score 30 27        Immunization History  Administered Date(s) Administered  . Influenza Split 10/29/2011, 11/04/2012  . Influenza Whole 11/03/2007, 10/09/2009  . Influenza, Ethan Dose Seasonal PF 11/23/2015, 09/04/2016, 09/16/2017, 11/23/2018  . Influenza,inj,Quad PF,6+ Mos 10/10/2014  . Pneumococcal Conjugate-13 11/23/2015  . Pneumococcal Polysaccharide-23 05/10/2010  . Tdap 03/07/2014  . Zoster 06/07/2014    Qualifies for Shingles Vaccine?***  Screening Tests Health Maintenance  Topic Date Due  . OPHTHALMOLOGY EXAM  02/25/2019  . HEMOGLOBIN A1C  06/21/2019  .  INFLUENZA VACCINE  07/17/2019  . FOOT EXAM  08/19/2019  . TETANUS/TDAP  03/07/2024  . PNA vac Low Risk Adult  Completed    Cancer Screenings: Lung: Low Dose CT Chest recommended if Age 68-80 years, 30 pack-year currently smoking OR have quit w/in 15years. Patient {DOES NOT  does:27190::"does not"} qualify. Breast:  Up to date on Mammogram? {Yes/No:30480221}   Up to date of Bone Density/Dexa? {Yes/No:30480221} Colorectal: ***  Additional Screenings: ***: Hepatitis C Screening:      Plan:   ***   I have personally reviewed and noted the following in the patient's chart:   . Medical and social history . Use of alcohol, tobacco or illicit drugs  . Current medications and supplements . Functional ability and status . Nutritional status . Physical activity . Advanced directives . List of other physicians . Hospitalizations, surgeries, and ER visits in previous 12 months . Vitals . Screenings to include cognitive, depression, and falls . Referrals and appointments  In addition, I have reviewed and discussed with patient certain preventive protocols, quality metrics, and best practice recommendations. A written personalized care plan for preventive services as well as general preventive health recommendations were provided to patient.     Ethan Mcneil, [place]  05/18/2019      Subjective:   Ethan Mcneil is a 85 y.o. male who presents for Medicare Annual (Subsequent) preventive examination.  Review of Systems:  ***       Objective:     Vitals: There were no vitals taken for this visit.  There is no height or weight on file to calculate BMI.  Advanced Directives 05/18/2018 05/15/2017 08/08/2014 08/02/2014 07/08/2014 02/21/2014  Does Patient Have a Medical Advance Directive? No Yes Yes Yes Patient would like information;Patient would not like information Patient has advance directive, copy not in chart  Type of Advance Directive - [place];Living will  [place];Living will [place];Living will - [place]  Does patient want to make changes to medical advance directive? - - No - Patient declined - - -  Copy of [place] in Chart? - No - copy requested No - copy requested No - copy requested - Copy requested from other (Comment)  Would patient like information on creating a medical advance directive? Yes (MAU/Ambulatory/Procedural Areas - Information given) - - - - -    Tobacco Social History   Tobacco Use  Smoking Status Never Smoker  Smokeless Tobacco Never Used     Counseling given: Not Answered   Clinical Intake:                       Past Medical History:  Diagnosis Date  . Arthritis   . CAD (coronary artery disease)    CABG 1997; grafts patent @ cath [facility] 11/09  . Chronic kidney disease   . DM2 (diabetes mellitus, type 2) ([place])   . Gout    after tick bite  . HLD (hyperlipidemia)   . HTN (hypertension)    essential nos  . Hypothyroid   . Postsurgical aortocoronary bypass status   . PVD (peripheral vascular disease) ([place])   . S/P herniorrhaphy   . Tick fever 2012   [facility] , [place]  . Unspecified hyperplasia of prostate without urinary obstruction and other lower urinary tract symptoms (LUTS)   . Urolithiasis 2015   hx of   Past Surgical History:  Procedure Laterality Date  . CORONARY ARTERY BYPASS GRAFT     x7 vessels 1997; cath 2002 and 2009; grafts patent   . CYSTOSCOPY WITH RETROGRADE PYELOGRAM, URETEROSCOPY AND STENT PLACEMENT Right 02/21/2014   Procedure: CYSTOSCOPY WITH RIGHT  RETROGRADE PYELOGRAM,RIGHT  URETEROSCOPY WITH STONE BASKETING EXTRACTION;  Surgeon: Irine Seal,  MD;  Location[facility];  Service: Urology;  Laterality: Right;  . INGUINAL HERNIA REPAIR    . SHOULDER SURGERY     Family History  Problem Relation Age of Onset  . Lung cancer Father   . Coronary artery disease Mother        stent  .  Diabetes Brother   . Prostate cancer Maternal Uncle        in his 10s  . Stroke Son        GF  . Throat cancer Son   . Colon cancer Neg Hx   . Rectal cancer Neg Hx   . Stomach cancer Neg Hx   . Esophageal cancer Neg Hx   . Pancreatic cancer Neg Hx    Social History   Socioeconomic History  . Marital status: Married    Spouse name: [name]  . Number of children: 4  . Years of education: Not on file  . Highest education level: Not on file  Occupational History  . Occupation: retired, works few hours   Social Needs  . Financial resource strain: Not on file  . Food insecurity:    Worry: Not on file    Inability: Not on file  . Transportation needs:    Medical: Not on file    Non-medical: Not on file  Tobacco Use  . Smoking status: Never Smoker  . Smokeless tobacco: Never Used  Substance and Sexual Activity  . Alcohol use: No  . Drug use: No  . Sexual activity: Not Currently  Lifestyle  . Physical activity:    Days per week: Not on file    Minutes per session: Not on file  . Stress: Not on file  Relationships  . Social connections:    Talks on phone: Not on file    Gets together: Not on file    Attends religious service: Not on file    Active member of club or organization: Not on file    Attends meetings of clubs or organizations: Not on file    Relationship status: Not on file  Other Topics Concern  . 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 aspirin EC 81 MG tablet Take 81 mg by mouth daily.  Marland Kitchen augmented betamethasone dipropionate (DIPROLENE-AF) 0.05 % cream   . Cyanocobalamin (B-12) 1000 MCG TABS Take 1,000 mcg by mouth daily.  . ferrous sulfate 325 (65 FE) MG tablet Take 1 tablet (325 mg total) by mouth daily with  breakfast.  . glucose blood (FREESTYLE TEST STRIPS) test strip CHECK BLOOD SUGAR NO MORE THAN TWICE DAILY.  Marland Kitchen insulin lispro (HUMALOG KWIKPEN) 100 UNIT/ML KwikPen INJECT 3 TIMES A DAY (JUST BEFORE EACH MEAL), 6-6-9 UNITS, AND PEN NEEDLES 4/DAY  . Insulin Pen Needle 31G X 5 MM MISC To use w/ Basaglar  . levothyroxine (SYNTHROID, LEVOTHROID) 125 MCG tablet Take 1 tablet (125 mcg total) by mouth daily before breakfast.  . metoprolol succinate (TOPROL-XL) 25 MG 24 hr tablet Take 0.5 tablets (12.5 mg total) by mouth daily.  . Pancrelipase, Lip-Prot-Amyl, (ZENPEP) 25000 units CPEP Take 2 with meals and snacks  . ramipril (ALTACE) 2.5 MG capsule Take 1 capsule (2.5 mg total) by  mouth daily.  . simvastatin (ZOCOR) 20 MG tablet Take 1 tablet (20 mg total) by mouth at bedtime.  . Vitamin D, Ergocalciferol, (DRISDOL) 1.25 MG (50000 UT) CAPS capsule Take 1 capsule (50,000 Units total) by mouth every 7 (seven) days.   No facility-administered encounter medications on file as of 05/20/2019.     Activities of Daily Living In your present state of health, do you have any difficulty performing the following activities: 05/18/2018  Hearing? N  Vision? N  Comment wears reading glasses.  Difficulty concentrating or making decisions? N  Walking or climbing stairs? N  Dressing or bathing? N  Doing errands, shopping? N  Preparing Food and eating ? N  Using the Toilet? N  In the past six months, have you accidently leaked urine? N  Do you have problems with loss of bowel control? N  Managing your Medications? N  Managing your Finances? N  Housekeeping or managing your Housekeeping? N  Some recent data might be hidden    Patient Care Team: Colon Branch, MD as PCP - General (Internal Medicine) Festus Aloe, MD as Consulting Physician (Urology) Josue Hector, MD as Consulting Physician (Cardiology) Irene Shipper, MD as Consulting Physician (Gastroenterology) Renato Shin, MD as Consulting Physician  (Endocrinology) Phylliss Blakes, OD as Consulting Physician (Optometry)    Assessment:   This is a routine wellness examination for Ethan Mcneil.  Exercise Activities and Dietary recommendations    Goals    . Maintain current health and good blood sugar levels    . Maintain physical strength       Fall Risk Fall Risk  05/18/2018 05/15/2017 01/06/2017 09/04/2016 05/23/2016  Falls in the past year? No No No No No   Is the patient's home free of loose throw rugs in walkways, pet beds, electrical cords, etc?   {Blank single:19197::"yes","no"}      Grab bars in the bathroom? {Blank single:19197::"yes","no"}      Handrails on the stairs?   {Blank single:19197::"yes","no"}      Adequate lighting?   {Blank single:19197::"yes","no"}  Timed Get Up and Go performed: ***  Depression Screen PHQ 2/9 Scores 05/18/2018 05/15/2017 01/06/2017 09/04/2016  PHQ - 2 Score 0 0 0 0     Cognitive Function MMSE - Mini Mental State Exam 05/18/2018 05/15/2017  Orientation to time 5 5  Orientation to Place 5 5  Registration 3 3  Attention/ Calculation 5 4  Recall 3 2  Language- name 2 objects 2 2  Language- repeat 1 0  Language- follow 3 step command 3 3  Language- read & follow direction 1 1  Write a sentence 1 1  Copy design 1 1  Total score 30 27        Immunization History  Administered Date(s) Administered  . Influenza Split 10/29/2011, 11/04/2012  . Influenza Whole 11/03/2007, 10/09/2009  . Influenza, Ethan Dose Seasonal PF 11/23/2015, 09/04/2016, 09/16/2017, 11/23/2018  . Influenza,inj,Quad PF,6+ Mos 10/10/2014  . Pneumococcal Conjugate-13 11/23/2015  . Pneumococcal Polysaccharide-23 05/10/2010  . Tdap 03/07/2014  . Zoster 06/07/2014    Qualifies for Shingles Vaccine?***  Screening Tests Health Maintenance  Topic Date Due  . OPHTHALMOLOGY EXAM  02/25/2019  . HEMOGLOBIN A1C  06/21/2019  . INFLUENZA VACCINE  07/17/2019  . FOOT EXAM  08/19/2019  . TETANUS/TDAP  03/07/2024  . PNA vac Low Risk  Adult  Completed    Cancer Screenings: Lung: Low Dose CT Chest recommended if Age 68-80 years, 30 pack-year currently smoking OR have  quit w/in 15years. Patient {DOES NOT does:27190::"does not"} qualify. Breast:  Up to date on Mammogram? {Yes/No:30480221}   Up to date of Bone Density/Dexa? {Yes/No:30480221} Colorectal: ***  Additional Screenings: ***: Hepatitis C Screening:      Plan:   ***   I have personally reviewed and noted the following in the patient's chart:   . Medical and social history . Use of alcohol, tobacco or illicit drugs  . Current medications and supplements . Functional ability and status . Nutritional status . Physical activity . Advanced directives . List of other physicians . Hospitalizations, surgeries, and ER visits in previous 12 months . Vitals . Screenings to include cognitive, depression, and falls . Referrals and appointments  In addition, I have reviewed and discussed with patient certain preventive protocols, quality metrics, and best practice recommendations. A written personalized care plan for preventive services as well as general preventive health recommendations were provided to patient.     Ethan Mcneil, [place]  05/18/2019      Subjective:   Ethan Mcneil is a 85 y.o. male who presents for Medicare Annual (Subsequent) preventive examination.  Review of Systems:  ***       Objective:     Vitals: There were no vitals taken for this visit.  There is no height or weight on file to calculate BMI.  Advanced Directives 05/18/2018 05/15/2017 08/08/2014 08/02/2014 07/08/2014 02/21/2014  Does Patient Have a Medical Advance Directive? No Yes Yes Yes Patient would like information;Patient would not like information Patient has advance directive, copy not in chart  Type of Advance Directive - [place];Living will [place];Living will [place];Living will - [place]  Does patient want to make changes to medical advance directive? - - No - Patient declined - - -  Copy of Faith in Chart? - No - copy requested No - copy requested No - copy requested - Copy requested from other (Comment)  Would patient like information on creating a medical advance directive? Yes (MAU/Ambulatory/Procedural Areas - Information given) - - - - -    Tobacco Social History   Tobacco Use  Smoking Status Never Smoker  Smokeless Tobacco Never Used     Counseling given: Not Answered   Clinical Intake:                       Past Medical History:  Diagnosis Date  . Arthritis   . CAD (coronary artery disease)    CABG 1997; grafts patent @ cath [facility] 11/09  . Chronic kidney disease   . DM2 (diabetes mellitus, type 2) ([place])   . Gout    after tick bite  . HLD (hyperlipidemia)   . HTN (hypertension)    essential nos  . Hypothyroid   . Postsurgical aortocoronary bypass status   . PVD (peripheral vascular disease) ([place])   . S/P herniorrhaphy   . Tick fever 2012   [facility] , [place]  . Unspecified hyperplasia of prostate without urinary obstruction and other lower urinary tract symptoms (LUTS)   . Urolithiasis 2015   hx of   Past Surgical History:  Procedure Laterality Date  . CORONARY ARTERY BYPASS GRAFT     x7 vessels 1997; cath 2002 and 2009; grafts patent   . CYSTOSCOPY WITH RETROGRADE PYELOGRAM, URETEROSCOPY AND STENT PLACEMENT Right 02/21/2014   Procedure: CYSTOSCOPY WITH RIGHT  RETROGRADE PYELOGRAM,RIGHT  URETEROSCOPY WITH STONE  BASKETING EXTRACTION;  Surgeon: Irine Seal, MD;  Location: [facility];  Service: Urology;  Laterality: Right;  . INGUINAL HERNIA REPAIR    . SHOULDER SURGERY     Family History  Problem Relation Age of Onset  . Lung cancer Father   . Coronary artery disease Mother        stent  . Diabetes Brother   . Prostate cancer Maternal Uncle        in his 56s  . Stroke Son        GF  .  Throat cancer Son   . Colon cancer Neg Hx   . Rectal cancer Neg Hx   . Stomach cancer Neg Hx   . Esophageal cancer Neg Hx   . Pancreatic cancer Neg Hx    Social History   Socioeconomic History  . Marital status: Married    Spouse name: [name]  . Number of children: 4  . Years of education: Not on file  . Highest education level: Not on file  Occupational History  . Occupation: retired, works few hours   Social Needs  . Financial resource strain: Not on file  . Food insecurity:    Worry: Not on file    Inability: Not on file  . Transportation needs:    Medical: Not on file    Non-medical: Not on file  Tobacco Use  . Smoking status: Never Smoker  . Smokeless tobacco: Never Used  Substance and Sexual Activity  . Alcohol use: No  . Drug use: No  . Sexual activity: Not Currently  Lifestyle  . Physical activity:    Days per week: Not on file    Minutes per session: Not on file  . Stress: Not on file  Relationships  . Social connections:    Talks on phone: Not on file    Gets together: Not on file    Attends religious service: Not on file    Active member of club or organization: Not on file    Attends meetings of clubs or organizations: Not on file    Relationship status: Not on file  Other Topics Concern  . Not on file  Social History Narrative   Buyer, retail and former race car driver   4 children (boys)   Designated party form signed appointing wife - Ethan Mcneil; ok to leave msg on home phone [phone]          Outpatient Encounter Medications as of 05/20/2019  Medication Sig  . aspirin EC 81 MG tablet Take 81 mg by mouth daily.  Marland Kitchen augmented betamethasone dipropionate (DIPROLENE-AF) 0.05 % cream   . Cyanocobalamin (B-12) 1000 MCG TABS Take 1,000 mcg by mouth daily.  . ferrous sulfate 325 (65 FE) MG tablet Take 1 tablet (325 mg total) by mouth daily with breakfast.  . glucose blood (FREESTYLE TEST STRIPS) test strip CHECK BLOOD SUGAR NO MORE THAN TWICE  DAILY.  Marland Kitchen insulin lispro (HUMALOG KWIKPEN) 100 UNIT/ML KwikPen INJECT 3 TIMES A DAY (JUST BEFORE EACH MEAL), 6-6-9 UNITS, AND PEN NEEDLES 4/DAY  . Insulin Pen Needle 31G X 5 MM MISC To use w/ Basaglar  . levothyroxine (SYNTHROID, LEVOTHROID) 125 MCG tablet Take 1 tablet (125 mcg total) by mouth daily before breakfast.  . metoprolol succinate (TOPROL-XL) 25 MG 24 hr tablet Take 0.5 tablets (12.5 mg total) by mouth daily.  . Pancrelipase, Lip-Prot-Amyl, (ZENPEP) 25000 units CPEP Take 2 with meals and snacks  . ramipril (ALTACE) 2.5 MG capsule Take  1 capsule (2.5 mg total) by mouth daily.  . simvastatin (ZOCOR) 20 MG tablet Take 1 tablet (20 mg total) by mouth at bedtime.  . Vitamin D, Ergocalciferol, (DRISDOL) 1.25 MG (50000 UT) CAPS capsule Take 1 capsule (50,000 Units total) by mouth every 7 (seven) days.   No facility-administered encounter medications on file as of 05/20/2019.     Activities of Daily Living In your present state of health, do you have any difficulty performing the following activities: 05/18/2018  Hearing? N  Vision? N  Comment wears reading glasses.  Difficulty concentrating or making decisions? N  Walking or climbing stairs? N  Dressing or bathing? N  Doing errands, shopping? N  Preparing Food and eating ? N  Using the Toilet? N  In the past six months, have you accidently leaked urine? N  Do you have problems with loss of bowel control? N  Managing your Medications? N  Managing your Finances? N  Housekeeping or managing your Housekeeping? N  Some recent data might be hidden    Patient Care Team: Colon Branch, MD as PCP - General (Internal Medicine) Festus Aloe, MD as Consulting Physician (Urology) Josue Hector, MD as Consulting Physician (Cardiology) Irene Shipper, MD as Consulting Physician (Gastroenterology) Renato Shin, MD as Consulting Physician (Endocrinology) Phylliss Blakes, OD as Consulting Physician (Optometry)    Assessment:   This is a  routine wellness examination for [place].  Exercise Activities and Dietary recommendations    Goals    . Maintain current health and good blood sugar levels    . Maintain physical strength       Fall Risk Fall Risk  05/18/2018 05/15/2017 01/06/2017 09/04/2016 05/23/2016  Falls in the past year? No No No No No   Is the patient's home free of loose throw rugs in walkways, pet beds, electrical cords, etc?   {Blank single:19197::"yes","no"}      Grab bars in the bathroom? {Blank single:19197::"yes","no"}      Handrails on the stairs?   {Blank single:19197::"yes","no"}      Adequate lighting?   {Blank single:19197::"yes","no"}  Timed Get Up and Go performed: ***  Depression Screen PHQ 2/9 Scores 05/18/2018 05/15/2017 01/06/2017 09/04/2016  PHQ - 2 Score 0 0 0 0     Cognitive Function MMSE - Mini Mental State Exam 05/18/2018 05/15/2017  Orientation to time 5 5  Orientation to Place 5 5  Registration 3 3  Attention/ Calculation 5 4  Recall 3 2  Language- name 2 objects 2 2  Language- repeat 1 0  Language- follow 3 step command 3 3  Language- read & follow direction 1 1  Write a sentence 1 1  Copy design 1 1  Total score 30 27        Immunization History  Administered Date(s) Administered  . Influenza Split 10/29/2011, 11/04/2012  . Influenza Whole 11/03/2007, 10/09/2009  . Influenza, Ethan Dose Seasonal PF 11/23/2015, 09/04/2016, 09/16/2017, 11/23/2018  . Influenza,inj,Quad PF,6+ Mos 10/10/2014  . Pneumococcal Conjugate-13 11/23/2015  . Pneumococcal Polysaccharide-23 05/10/2010  . Tdap 03/07/2014  . Zoster 06/07/2014    Qualifies for Shingles Vaccine?***  Screening Tests Health Maintenance  Topic Date Due  . OPHTHALMOLOGY EXAM  02/25/2019  . HEMOGLOBIN A1C  06/21/2019  . INFLUENZA VACCINE  07/17/2019  . FOOT EXAM  08/19/2019  . TETANUS/TDAP  03/07/2024  . PNA vac Low Risk Adult  Completed    Cancer Screenings: Lung: Low Dose CT Chest recommended if Age 61-80 years, 17  pack-year currently smoking OR have quit w/in 15years. Patient {DOES NOT does:27190::"does not"} qualify. Breast:  Up to date on Mammogram? {Yes/No:30480221}   Up to date of Bone Density/Dexa? {Yes/No:30480221} Colorectal: ***  Additional Screenings: ***: Hepatitis C Screening:      Plan:   ***   I have personally reviewed and noted the following in the patient's chart:   . Medical and social history . Use of alcohol, tobacco or illicit drugs  . Current medications and supplements . Functional ability and status . Nutritional status . Physical activity . Advanced directives . List of other physicians . Hospitalizations, surgeries, and ER visits in previous 12 months . Vitals . Screenings to include cognitive, depression, and falls . Referrals and appointments  In addition, I have reviewed and discussed with patient certain preventive protocols, quality metrics, and best practice recommendations. A written personalized care plan for preventive services as well as general preventive health recommendations were provided to patient.     Ethan Mcneil, [place]  05/18/2019      Dietary issues and exercise activities discussed:    Goals    . Maintain current health and good blood sugar levels    . Maintain physical strength      Depression Screen PHQ 2/9 Scores 05/18/2018 05/15/2017 01/06/2017 09/04/2016  PHQ - 2 Score 0 0 0 0    Fall Risk Fall Risk  05/18/2018 05/15/2017 01/06/2017 09/04/2016 05/23/2016  Falls in the past year? No No No No No    Is the patient's home free of loose throw rugs in walkways, pet beds, electrical cords, etc?   {Blank single:19197::"yes","no"}      Grab bars in the bathroom? {Blank single:19197::"yes","no"}      Handrails on the stairs?   {Blank single:19197::"yes","no"}      Adequate lighting?   {Blank single:19197::"yes","no"}  Timed Get Up and Go performed: ***  Cognitive Function: Ad8 score reviewed for issues:  Issues making decisions:   Less interest in hobbies / activities:  Repeats questions, stories (family complaining):  Trouble using ordinary gadgets (microwave, computer, phone):  Forgets the month or year:   Mismanaging finances:   Remembering appts:  Daily problems with thinking and/or memory: Ad8 score is=     MMSE - Mini Mental State Exam 05/18/2018 05/15/2017  Orientation to time 5 5  Orientation to Place 5 5  Registration 3 3  Attention/ Calculation 5 4  Recall 3 2  Language- name 2 objects 2 2  Language- repeat 1 0  Language- follow 3 step command 3 3  Language- read & follow direction 1 1  Write a sentence 1 1  Copy design 1 1  Total score 30 27        Screening Tests Health Maintenance  Topic Date Due  . OPHTHALMOLOGY EXAM  02/25/2019  . HEMOGLOBIN A1C  06/21/2019  . INFLUENZA VACCINE  07/17/2019  . FOOT EXAM  08/19/2019  . TETANUS/TDAP  03/07/2024  . PNA vac Low Risk Adult  Completed    Qualifies for Shingles Vaccine? ***  Cancer Screenings: Lung: Low Dose CT Chest recommended if Age 20-80 years, 30 pack-year currently smoking OR have quit w/in 15years. Patient {DOES NOT does:27190::"does not"} qualify. Colorectal: ***  Additional Screenings: *** Hepatitis C Screening:       Plan:   ***  I have personally reviewed and noted the following in the patient's chart:   . Medical and social history . Use of alcohol, tobacco or illicit drugs  .  Current medications and supplements . Functional ability and status . Nutritional status . Physical activity . Advanced directives . List of other physicians . Hospitalizations, surgeries, and ER visits in previous 12 months . Vitals . Screenings to include cognitive, depression, and falls . Referrals and appointments  In addition, I have reviewed and discussed with patient certain preventive protocols, quality metrics, and best practice recommendations. A written personalized care plan for preventive services as well as  general preventive health recommendations were provided to patient.     Ethan Mcneil, [place]   05/18/2019

## 2019-05-19 NOTE — Progress Notes (Addendum)
Virtual Visit via Video Note  I connected with patient on 05/20/19 at  8:00 AM EDT by a video enabled telemedicine application and verified that I am speaking with the correct person using two identifiers.   THIS ENCOUNTER IS A VIRTUAL VISIT DUE TO COVID-19 - PATIENT WAS NOT SEEN IN THE OFFICE. PATIENT HAS CONSENTED TO VIRTUAL VISIT / TELEMEDICINE VISIT   Location of patient: home  Location of provider: office  I discussed the limitations of evaluation and management by telemedicine and the availability of in person appointments. The patient expressed understanding and agreed to proceed.   Subjective:   Ethan Mcneil is a 85 y.o. male who presents for Medicare Annual/Subsequent preventive examination.  Review of Systems: No ROS.  Medicare Wellness Virtual Visit.  Visual/audio telehealth visit, UTA vital signs.   See social history for additional risk factors. Cardiac Risk Factors include: advanced age (>66men, >43 women);dyslipidemia;diabetes mellitus;hypertension;male gender Sleep patterns: no issues Home Safety/Smoke Alarms: Feels safe in home. Smoke alarms in place.  Lives with wife in 1 story home.  Male:       PSA-  Lab Results  Component Value Date   PSA 1.19 05/02/2010   PSA 1.06 03/24/2009   PSA 1.13 11/03/2007       Objective:     Advanced Directives 05/20/2019 05/18/2018 05/15/2017 08/08/2014 08/02/2014 07/08/2014 02/21/2014  Does Patient Have a Medical Advance Directive? No No Yes Yes Yes Patient would like information;Patient would not like information Patient has advance directive, copy not in chart  Type of Advance Directive - - [place];Living will [place];Living will [place];Living will - [place]  Does patient want to make changes to medical advance directive? No - Patient declined - - No - Patient declined - - -  Copy of [place] in Chart? - - No - copy requested  No - copy requested No - copy requested - Copy requested from other (Comment)  Would patient like information on creating a medical advance directive? - Yes (MAU/Ambulatory/Procedural Areas - Information given) - - - - -    Tobacco Social History   Tobacco Use  Smoking Status Never Smoker  Smokeless Tobacco Never Used     Counseling given: Not Answered   Clinical Intake:     Pain : No/denies pain    Past Medical History:  Diagnosis Date  . Arthritis   . CAD (coronary artery disease)    CABG 1997; grafts patent @ cath [facility] 11/09  . Chronic kidney disease   . DM2 (diabetes mellitus, type 2) ([place])   . Gout    after tick bite  . HLD (hyperlipidemia)   . HTN (hypertension)    essential nos  . Hypothyroid   . Postsurgical aortocoronary bypass status   . PVD (peripheral vascular disease) ([place])   . S/P herniorrhaphy   . Tick fever 2012   [facility] , [place]  . Unspecified hyperplasia of prostate without urinary obstruction and other lower urinary tract symptoms (LUTS)   . Urolithiasis 2015   hx of   Past Surgical History:  Procedure Laterality Date  . CORONARY ARTERY BYPASS GRAFT     x7 vessels 1997; cath 2002 and 2009; grafts patent   . CYSTOSCOPY WITH RETROGRADE PYELOGRAM, URETEROSCOPY AND STENT PLACEMENT Right 02/21/2014   Procedure: CYSTOSCOPY WITH RIGHT  RETROGRADE PYELOGRAM,RIGHT  URETEROSCOPY WITH STONE BASKETING EXTRACTION;  Surgeon: Irine Seal, MD;  Location: [facility];  Service: Urology;  Laterality: Right;  . INGUINAL HERNIA REPAIR    . SHOULDER SURGERY     Family History  Problem Relation Age of Onset  . Lung cancer Father   . Coronary artery disease Mother        stent  . Diabetes Brother   . Prostate cancer Maternal Uncle        in his 57s  . Stroke Son        GF  . Throat cancer Son   . Colon cancer Neg Hx   . Rectal cancer Neg Hx   . Stomach cancer Neg Hx   . Esophageal cancer Neg Hx   . Pancreatic cancer Neg Hx    Social History    Socioeconomic History  . Marital status: Married    Spouse name: [name]  . Number of children: 4  . Years of education: Not on file  . Highest education level: Not on file  Occupational History  . Occupation: retired, works few hours   Social Needs  . Financial resource strain: Not on file  . Food insecurity:    Worry: Not on file    Inability: Not on file  . Transportation needs:    Medical: Not on file    Non-medical: Not on file  Tobacco Use  . Smoking status: Never Smoker  . Smokeless tobacco: Never Used  Substance and Sexual Activity  . Alcohol use: No  . Drug use: No  . Sexual activity: Not Currently  Lifestyle  . Physical activity:    Days per week: Not on file    Minutes per session: Not on file  . Stress: Not on file  Relationships  . Social connections:    Talks on phone: Not on file    Gets together: Not on file    Attends religious service: Not on file    Active member of club or organization: Not on file    Attends meetings of clubs or organizations: Not on file    Relationship status: Not on file  Other Topics Concern  . Not on file  Social History Narrative   Buyer, retail and former race car driver   4 children (boys)   Designated party form signed appointing wife - [name]; ok to leave msg on home phone [phone]          Outpatient Encounter Medications as of 05/20/2019  Medication Sig  . aspirin EC 81 MG tablet Take 81 mg by mouth daily.  Marland Kitchen augmented betamethasone dipropionate (DIPROLENE-AF) 0.05 % cream   . Cyanocobalamin (B-12) 1000 MCG TABS Take 1,000 mcg by mouth daily.  . ferrous sulfate 325 (65 FE) MG tablet Take 1 tablet (325 mg total) by mouth daily with breakfast.  . glucose blood (FREESTYLE TEST STRIPS) test strip CHECK BLOOD SUGAR NO MORE THAN TWICE DAILY.  Marland Kitchen insulin lispro (HUMALOG KWIKPEN) 100 UNIT/ML KwikPen INJECT 3 TIMES A DAY (JUST BEFORE EACH MEAL), 6-6-9 UNITS, AND PEN NEEDLES 4/DAY  . Insulin Pen Needle 31G X 5 MM MISC  To use w/ Basaglar  . levothyroxine (SYNTHROID, LEVOTHROID) 125 MCG tablet Take 1 tablet (125 mcg total) by mouth daily before breakfast.  . metoprolol succinate (TOPROL-XL) 25 MG 24 hr tablet Take 0.5 tablets (12.5 mg total) by mouth daily.  . Pancrelipase, Lip-Prot-Amyl, (ZENPEP) 25000 units CPEP Take 2 with meals and snacks  . ramipril (ALTACE) 2.5 MG capsule Take 1 capsule (2.5 mg total) by mouth daily.  . simvastatin (ZOCOR)  20 MG tablet Take 1 tablet (20 mg total) by mouth at bedtime.  . Vitamin D, Ergocalciferol, (DRISDOL) 1.25 MG (50000 UT) CAPS capsule Take 1 capsule (50,000 Units total) by mouth every 7 (seven) days.   No facility-administered encounter medications on file as of 05/20/2019.     Activities of Daily Living In your present state of health, do you have any difficulty performing the following activities: 05/20/2019  Hearing? N  Vision? N  Difficulty concentrating or making decisions? N  Walking or climbing stairs? N  Dressing or bathing? N  Doing errands, shopping? N  Preparing Food and eating ? N  Using the Toilet? N  In the past six months, have you accidently leaked urine? N  Do you have problems with loss of bowel control? N  Managing your Medications? N  Managing your Finances? N  Housekeeping or managing your Housekeeping? N  Some recent data might be hidden    Patient Care Team: Colon Branch, MD as PCP - General (Internal Medicine) Festus Aloe, MD as Consulting Physician (Urology) Josue Hector, MD as Consulting Physician (Cardiology) Irene Shipper, MD as Consulting Physician (Gastroenterology) Renato Shin, MD as Consulting Physician (Endocrinology) Phylliss Blakes, OD as Consulting Physician (Optometry)   Assessment:   This is a routine wellness examination for Ethan Mcneil. .nopce  Exercise Activities and Dietary recommendations Current Exercise Habits: The patient does not participate in regular exercise at present, Exercise limited by: None  identified Diet (meal preparation, eat out, water intake, caffeinated beverages, dairy products, fruits and vegetables): well balanced, on average, 2-3 meals per day  Goals    . Maintain current health and good blood sugar levels    . Maintain physical strength       Fall Risk Fall Risk  05/20/2019 05/18/2018 05/15/2017 01/06/2017 09/04/2016  Falls in the past year? 0 No No No No    Depression Screen PHQ 2/9 Scores 05/20/2019 05/18/2018 05/15/2017 01/06/2017  PHQ - 2 Score 0 0 0 0    Cognitive Function Ad8 score reviewed for issues:  Issues making decisions:no  Less interest in hobbies / activities:no  Repeats questions, stories (family complaining):no  Trouble using ordinary gadgets (microwave, computer, phone):no  Forgets the month or year: no  Mismanaging finances: no  Remembering appts:no  Daily problems with thinking and/or memory:no Ad8 score is=0     MMSE - Mini Mental State Exam 05/18/2018 05/15/2017  Orientation to time 5 5  Orientation to Place 5 5  Registration 3 3  Attention/ Calculation 5 4  Recall 3 2  Language- name 2 objects 2 2  Language- repeat 1 0  Language- follow 3 step command 3 3  Language- read & follow direction 1 1  Write a sentence 1 1  Copy design 1 1  Total score 30 27        Immunization History  Administered Date(s) Administered  . Influenza Split 10/29/2011, 11/04/2012  . Influenza Whole 11/03/2007, 10/09/2009  . Influenza, High Dose Seasonal PF 11/23/2015, 09/04/2016, 09/16/2017, 11/23/2018  . Influenza,inj,Quad PF,6+ Mos 10/10/2014  . Pneumococcal Conjugate-13 11/23/2015  . Pneumococcal Polysaccharide-23 05/10/2010  . Tdap 03/07/2014  . Zoster 06/07/2014    Screening Tests Health Maintenance  Topic Date Due  . OPHTHALMOLOGY EXAM  02/25/2019  . HEMOGLOBIN A1C  06/21/2019  . INFLUENZA VACCINE  07/17/2019  . FOOT EXAM  08/19/2019  . TETANUS/TDAP  03/07/2024  . PNA vac Low Risk Adult  Completed  Plan:   See you  next year.  Continue to eat heart healthy diet (full of fruits, vegetables, whole grains, lean protein, water--limit salt, fat, and sugar intake) and increase physical activity as tolerated.  Continue doing brain stimulating activities (puzzles, reading, adult coloring books, staying active) to keep memory sharp.     I have personally reviewed and noted the following in the patient's chart:   . Medical and social history . Use of alcohol, tobacco or illicit drugs  . Current medications and supplements . Functional ability and status . Nutritional status . Physical activity . Advanced directives . List of other physicians . Hospitalizations, surgeries, and ER visits in previous 12 months . Vitals . Screenings to include cognitive, depression, and falls . Referrals and appointments  In addition, I have reviewed and discussed with patient certain preventive protocols, quality metrics, and best practice recommendations. A written personalized care plan for preventive services as well as general preventive health recommendations were provided to patient.     Naaman Plummer [place], [place]  05/20/2019  Kathlene November, MD

## 2019-05-20 ENCOUNTER — Encounter: Payer: Self-pay | Admitting: *Deleted

## 2019-05-20 ENCOUNTER — Other Ambulatory Visit: Payer: Self-pay

## 2019-05-20 ENCOUNTER — Ambulatory Visit (INDEPENDENT_AMBULATORY_CARE_PROVIDER_SITE_OTHER): Payer: Medicare Other | Admitting: *Deleted

## 2019-05-20 DIAGNOSIS — Z0000 Encounter for general adult medical examination without abnormal findings: Secondary | ICD-10-CM | POA: Diagnosis not present

## 2019-05-20 NOTE — Patient Instructions (Signed)
See you next year.  Continue to eat heart healthy diet (full of fruits, vegetables, whole grains, lean protein, water--limit salt, fat, and sugar intake) and increase physical activity as tolerated.  Continue doing brain stimulating activities (puzzles, reading, adult coloring books, staying active) to keep memory sharp.    Ethan Mcneil , Thank you for taking time to come for your Medicare Wellness Visit. I appreciate your ongoing commitment to your health goals. Please review the following plan we discussed and let me know if I can assist you in the future.   These are the goals we discussed: Goals    . Maintain current health and good blood sugar levels    . Maintain physical strength       This is a list of the screening recommended for you and due dates:  Health Maintenance  Topic Date Due  . Eye exam for diabetics  02/25/2019  . Hemoglobin A1C  06/21/2019  . Flu Shot  07/17/2019  . Complete foot exam   08/19/2019  . Tetanus Vaccine  03/07/2024  . Pneumonia vaccines  Completed    Health Maintenance After Age 19 After age 39, you are at a higher risk for certain long-term diseases and infections as well as injuries from falls. Falls are a major cause of broken bones and head injuries in people who are older than age 29. Getting regular preventive care can help to keep you healthy and well. Preventive care includes getting regular testing and making lifestyle changes as recommended by your health care provider. Talk with your health care provider about:  Which screenings and tests you should have. A screening is a test that checks for a disease when you have no symptoms.  A diet and exercise plan that is right for you. What should I know about screenings and tests to prevent falls? Screening and testing are the best ways to find a health problem early. Early diagnosis and treatment give you the best chance of managing medical conditions that are common after age 7. Certain  conditions and lifestyle choices may make you more likely to have a fall. Your health care provider may recommend:  Regular vision checks. Poor vision and conditions such as cataracts can make you more likely to have a fall. If you wear glasses, make sure to get your prescription updated if your vision changes.  Medicine review. Work with your health care provider to regularly review all of the medicines you are taking, including over-the-counter medicines. Ask your health care provider about any side effects that may make you more likely to have a fall. Tell your health care provider if any medicines that you take make you feel dizzy or sleepy.  Osteoporosis screening. Osteoporosis is a condition that causes the bones to get weaker. This can make the bones weak and cause them to break more easily.  Blood pressure screening. Blood pressure changes and medicines to control blood pressure can make you feel dizzy.  Strength and balance checks. Your health care provider may recommend certain tests to check your strength and balance while standing, walking, or changing positions.  Foot health exam. Foot pain and numbness, as well as not wearing proper footwear, can make you more likely to have a fall.  Depression screening. You may be more likely to have a fall if you have a fear of falling, feel emotionally low, or feel unable to do activities that you used to do.  Alcohol use screening. Using too much alcohol can affect  your balance and may make you more likely to have a fall. What actions can I take to lower my risk of falls? General instructions  Talk with your health care provider about your risks for falling. Tell your health care provider if: ? You fall. Be sure to tell your health care provider about all falls, even ones that seem minor. ? You feel dizzy, sleepy, or off-balance.  Take over-the-counter and prescription medicines only as told by your health care provider. These include any  supplements.  Eat a healthy diet and maintain a healthy weight. A healthy diet includes low-fat dairy products, low-fat (lean) meats, and fiber from whole grains, beans, and lots of fruits and vegetables. Home safety  Remove any tripping hazards, such as rugs, cords, and clutter.  Install safety equipment such as grab bars in bathrooms and safety rails on stairs.  Keep rooms and walkways well-lit. Activity   Follow a regular exercise program to stay fit. This will help you maintain your balance. Ask your health care provider what types of exercise are appropriate for you.  If you need a cane or walker, use it as recommended by your health care provider.  Wear supportive shoes that have nonskid soles. Lifestyle  Do not drink alcohol if your health care provider tells you not to drink.  If you drink alcohol, limit how much you have: ? 0-1 drink a day for women. ? 0-2 drinks a day for men.  Be aware of how much alcohol is in your drink. In the [place] one drink equals one typical bottle of beer (12 oz), one-half glass of wine (5 oz), or one shot of hard liquor (1 oz).  Do not use any products that contain nicotine or tobacco, such as cigarettes and e-cigarettes. If you need help quitting, ask your health care provider. Summary  Having a healthy lifestyle and getting preventive care can help to protect your health and wellness after age 46.  Screening and testing are the best way to find a health problem early and help you avoid having a fall. Early diagnosis and treatment give you the best chance for managing medical conditions that are more common for people who are older than age 55.  Falls are a major cause of broken bones and head injuries in people who are older than age 33. Take precautions to prevent a fall at home.  Work with your health care provider to learn what changes you can make to improve your health and wellness and to prevent falls. This information is not intended  to replace advice given to you by your health care provider. Make sure you discuss any questions you have with your health care provider. Document Released: 10/15/2017 Document Revised: 10/15/2017 Document Reviewed: 10/15/2017 Elsevier Interactive Patient Education  2019 [employer].

## 2019-05-21 ENCOUNTER — Other Ambulatory Visit: Payer: Self-pay | Admitting: Internal Medicine

## 2019-07-10 ENCOUNTER — Other Ambulatory Visit: Payer: Self-pay | Admitting: Endocrinology

## 2019-07-10 NOTE — Telephone Encounter (Signed)
Please refill x 1 F/u is due  

## 2019-08-09 ENCOUNTER — Ambulatory Visit: Payer: Medicare Other | Admitting: Internal Medicine

## 2019-08-20 ENCOUNTER — Other Ambulatory Visit: Payer: Self-pay | Admitting: Internal Medicine

## 2019-09-08 ENCOUNTER — Other Ambulatory Visit: Payer: Self-pay | Admitting: Internal Medicine

## 2019-09-13 ENCOUNTER — Other Ambulatory Visit: Payer: Self-pay | Admitting: Internal Medicine

## 2019-12-04 ENCOUNTER — Other Ambulatory Visit: Payer: Self-pay | Admitting: Internal Medicine

## 2019-12-06 ENCOUNTER — Ambulatory Visit: Payer: Medicare Other | Admitting: Internal Medicine

## 2019-12-23 ENCOUNTER — Ambulatory Visit (INDEPENDENT_AMBULATORY_CARE_PROVIDER_SITE_OTHER): Payer: Medicare Other | Admitting: Internal Medicine

## 2019-12-23 ENCOUNTER — Other Ambulatory Visit: Payer: Self-pay

## 2019-12-23 DIAGNOSIS — E782 Mixed hyperlipidemia: Secondary | ICD-10-CM | POA: Diagnosis not present

## 2019-12-23 DIAGNOSIS — E1151 Type 2 diabetes mellitus with diabetic peripheral angiopathy without gangrene: Secondary | ICD-10-CM | POA: Diagnosis not present

## 2019-12-23 DIAGNOSIS — D649 Anemia, unspecified: Secondary | ICD-10-CM

## 2019-12-23 DIAGNOSIS — I10 Essential (primary) hypertension: Secondary | ICD-10-CM

## 2019-12-23 DIAGNOSIS — E559 Vitamin D deficiency, unspecified: Secondary | ICD-10-CM

## 2019-12-23 DIAGNOSIS — IMO0002 Reserved for concepts with insufficient information to code with codable children: Secondary | ICD-10-CM

## 2019-12-23 DIAGNOSIS — E039 Hypothyroidism, unspecified: Secondary | ICD-10-CM

## 2019-12-23 DIAGNOSIS — E1165 Type 2 diabetes mellitus with hyperglycemia: Secondary | ICD-10-CM

## 2019-12-23 NOTE — Progress Notes (Signed)
Subjective:    Patient ID: Ethan Mcneil, male    DOB: 03-11-1934, 86 y.o.   MRN: [id number]  DOS:  12/23/2019 Type of visit - description: Virtual Visit via Telephone  Attempted  to make this a video visit, due to technical difficulties from the patient side it was not possible  thus we proceeded with a Virtual Visit via Telephone    I connected with above mentioned patient  by telephone and verified that I am speaking with the correct person using two identifiers.  THIS ENCOUNTER IS A VIRTUAL VISIT DUE TO COVID-19 - PATIENT WAS NOT SEEN IN THE OFFICE. PATIENT HAS CONSENTED TO VIRTUAL VISIT / TELEMEDICINE VISIT   Location of patient: home  Location of provider: office  I discussed the limitations, risks, security and privacy concerns of performing an evaluation and management service by telephone and the availability of in person appointments. I also discussed with the patient that there may be a patient responsible charge related to this service. The patient expressed understanding and agreed to proceed.   Routine office visit: Since the last office visit he is doing okay and has no major concerns. We review his ambulatory CBGs. We manage his chronic medical problems including CAD, HTN, hypercalcemia, weight loss.    BP Readings from Last 3 Encounters:  02/08/19 118/66  12/21/18 122/64  08/18/18 122/60     Review of Systems Reports itchy skin, when asked he denies a rash admits his skin is very dry Denies chest pain or difficulty breathing No lower extremity edema or palpitations No nausea, vomiting, diarrhea  Past Medical History:  Diagnosis Date  . Arthritis   . CAD (coronary artery disease)    CABG 1997; grafts patent @ cath [facility] 11/09  . Chronic kidney disease   . DM2 (diabetes mellitus, type 2) ([place])   . Gout    after tick bite  . HLD (hyperlipidemia)   . HTN (hypertension)    essential nos  . Hypothyroid   . Postsurgical aortocoronary bypass status   .  PVD (peripheral vascular disease) ([place])   . S/P herniorrhaphy   . Tick fever 2012   [facility] , [place]  . Unspecified hyperplasia of prostate without urinary obstruction and other lower urinary tract symptoms (LUTS)   . Urolithiasis 2015   hx of    Past Surgical History:  Procedure Laterality Date  . CORONARY ARTERY BYPASS GRAFT     x7 vessels 1997; cath 2002 and 2009; grafts patent   . CYSTOSCOPY WITH RETROGRADE PYELOGRAM, URETEROSCOPY AND STENT PLACEMENT Right 02/21/2014   Procedure: CYSTOSCOPY WITH RIGHT  RETROGRADE PYELOGRAM,RIGHT  URETEROSCOPY WITH STONE BASKETING EXTRACTION;  Surgeon: Irine Seal, MD;  Location: [facility];  Service: Urology;  Laterality: Right;  . INGUINAL HERNIA REPAIR    . SHOULDER SURGERY      Social History   Socioeconomic History  . Marital status: Married    Spouse name: [name]  . Number of children: 4  . Years of education: Not on file  . Highest education level: Not on file  Occupational History  . Occupation: retired, works few hours   Tobacco Use  . Smoking status: Never Smoker  . Smokeless tobacco: Never Used  Substance and Sexual Activity  . Alcohol use: No  . Drug use: No  . Sexual activity: Not Currently  Other Topics Concern  . Not on file  Social History Narrative   Buyer, retail and former race car driver   4  children (boys)   Designated party form signed appointing wife - [name]; ok to leave msg on home phone [phone]         Social Determinants of Health   Financial Resource Strain:   . Difficulty of Paying Living Expenses: Not on file  Food Insecurity:   . Worried About Charity fundraiser in the Last Year: Not on file  . Ran Out of Food in the Last Year: Not on file  Transportation Needs:   . Lack of Transportation (Medical): Not on file  . Lack of Transportation (Non-Medical): Not on file  Physical Activity:   . Days of Exercise per Week: Not on file  . Minutes of Exercise per Session: Not on file    Stress:   . Feeling of Stress : Not on file  Social Connections:   . Frequency of Communication with Friends and Family: Not on file  . Frequency of Social Gatherings with Friends and Family: Not on file  . Attends Religious Services: Not on file  . Active Member of Clubs or Organizations: Not on file  . Attends Archivist Meetings: Not on file  . Marital Status: Not on file  Intimate Partner Violence:   . Fear of Current or Ex-Partner: Not on file  . Emotionally Abused: Not on file  . Physically Abused: Not on file  . Sexually Abused: Not on file      Allergies as of 12/23/2019      Reactions   Codeine Nausea And Vomiting      Medication List       Accurate as of December 23, 2019 11:59 PM. If you have any questions, ask your nurse or doctor.        aspirin EC 81 MG tablet Take 81 mg by mouth daily.   augmented betamethasone dipropionate 0.05 % cream Commonly known as: DIPROLENE-AF   B-12 1000 MCG Tabs Take 1,000 mcg by mouth daily.   ferrous sulfate 325 (65 FE) MG tablet Take 1 tablet (325 mg total) by mouth daily with breakfast.   FREESTYLE LITE test strip Generic drug: glucose blood CHECK BLOOD SUGAR NO MORE THAN TWICE DAILY.   insulin lispro 100 UNIT/ML KwikPen Commonly known as: HumaLOG KwikPen INJECT 3 TIMES A DAY (JUST BEFORE EACH MEAL), 6-6-9 UNITS, AND PEN NEEDLES 4/DAY   Insulin Pen Needle 31G X 5 MM Misc To use w/ Basaglar   levothyroxine 125 MCG tablet Commonly known as: SYNTHROID Take 1 tablet (125 mcg total) by mouth daily before breakfast.   metoprolol succinate 25 MG 24 hr tablet Commonly known as: TOPROL-XL Take 0.5 tablets (12.5 mg total) by mouth daily.   Pancrelipase (Lip-Prot-Amyl) 25000 units Cpep Commonly known as: Zenpep Take 2 with meals and snacks   ramipril 2.5 MG capsule Commonly known as: ALTACE Take 1 capsule (2.5 mg total) by mouth daily.   simvastatin 20 MG tablet Commonly known as: ZOCOR Take 1 tablet (20  mg total) by mouth at bedtime.   Vitamin D (Ergocalciferol) 1.25 MG (50000 UT) Caps capsule Commonly known as: DRISDOL Take 1 capsule (50,000 Units total) by mouth every 7 (seven) days.           Objective:   Physical Exam There were no vitals taken for this visit. This is a telephone virtual visit.  He is alert oriented x3, in no distress     Assessment    Assessment DM -- w/ CAD, no neuropathy, intolerant to metformin  HTN  Hyperlipidemia Hypothyroidism DJD BPH, LUTS,h/o urolithiasis -- sees urology CV: --CAD, CABG 1997, cardiac cath 2009 --Peripheral vascular disease (no ABIs in the chart) --L leg edema, chronic, (-) DVT per [place] 2015 Gout GI: Pancreatic insufficiency dx 02-2017 Anemia: Mild, normal iron 2015 B12 deficiency, mild 2015  Wt loss:  2012 187 lb, 2014 173 lb , 2015 167 01-2017: EGD normal, BX chronic active gastritis. Colonoscopy normal. Saw GI 02-2017: likely d/t Pancreatic insufficiency and poorly controlled diabetes. Wt better w/ pancreat enzymes  Liver  lesion per Ct, liver MRI 05-2017 benign, no further workup  H/o Tick fever 2012   PLAN: DM: Managed by endocrinology, CBGs in the morning usually 130, has not check since Christmas due to malfunction of his device but planning to get a new one. Since we are doing blood work I will check a A1c and send a message to Endo to get an appointment with them virtually. HTN: No ambulatory BPs, recommend to get a cuff and start checking.  Goals provided.  Continue metoprolol, Altace.  Check a CMP High cholesterol: Well-controlled on simvastatin, check a FLP Hypothyroidism: On Synthroid, well controlled, check a TSH Low vitamin D: S/p ergocalciferol, on daily OTC his vitamin D. Checking labs CAD: Asymptomatic.  Continue present care Mild anemia: Per chart review, check a CBC Pancreatic insufficiency: Good med compliance, has actually gained some weight. Itchy skin: Most likely from dry skin, recommend to avoid hot  showers and apply OTC moisturizer after every shower.   Plan: RTC to this office for blood work Office visit with me in 6 months       I discussed the assessment and treatment plan with the patient. The patient was provided an opportunity to ask questions and all were answered. The patient agreed with the plan and demonstrated an understanding of the instructions.   The patient was advised to call back or seek an in-person evaluation if the symptoms worsen or if the condition fails to improve as anticipated.  I provided > 30 minutes of non-face-to-face time during this encounter.  Kathlene November, MD

## 2019-12-24 ENCOUNTER — Telehealth: Payer: Self-pay

## 2019-12-24 NOTE — Telephone Encounter (Signed)
Patient has been scheduled for 12/29/2019

## 2019-12-24 NOTE — Telephone Encounter (Signed)
Per Dr. Larose Kells, pt is intolerant to Metformin. Following message received from Dr. Loanne Drilling:  please contact patient:  Needs f/u next avail. VV is fine   Routing this message to the front desk for scheduling purposes.

## 2019-12-25 NOTE — Assessment & Plan Note (Signed)
DM: Managed by endocrinology, CBGs in the morning usually 130, has not check since Christmas due to malfunction of his device but planning to get a new one. Since we are doing blood work I will check a A1c and send a message to Endo to get an appointment with them virtually. HTN: No ambulatory BPs, recommend to get a cuff and start checking.  Goals provided.  Continue metoprolol, Altace.  Check a CMP High cholesterol: Well-controlled on simvastatin, check a FLP Hypothyroidism: On Synthroid, well controlled, check a TSH Low vitamin D: S/p ergocalciferol, on daily OTC his vitamin D. Checking labs CAD: Asymptomatic.  Continue present care Mild anemia: Per chart review, check a CBC Pancreatic insufficiency: Good med compliance, has actually gained some weight. Itchy skin: Most likely from dry skin, recommend to avoid hot showers and apply OTC moisturizer after every shower.   Plan: RTC to this office for blood work Office visit with me in 6 months

## 2019-12-30 ENCOUNTER — Other Ambulatory Visit: Payer: Self-pay

## 2019-12-30 ENCOUNTER — Ambulatory Visit (INDEPENDENT_AMBULATORY_CARE_PROVIDER_SITE_OTHER): Payer: Medicare Other | Admitting: Endocrinology

## 2019-12-30 ENCOUNTER — Encounter: Payer: Self-pay | Admitting: Endocrinology

## 2019-12-30 VITALS — BP 104/60 | HR 80 | Ht 70.0 in | Wt 147.6 lb

## 2019-12-30 DIAGNOSIS — E1165 Type 2 diabetes mellitus with hyperglycemia: Secondary | ICD-10-CM

## 2019-12-30 DIAGNOSIS — E1151 Type 2 diabetes mellitus with diabetic peripheral angiopathy without gangrene: Secondary | ICD-10-CM | POA: Diagnosis not present

## 2019-12-30 DIAGNOSIS — IMO0002 Reserved for concepts with insufficient information to code with codable children: Secondary | ICD-10-CM

## 2019-12-30 LAB — POCT GLYCOSYLATED HEMOGLOBIN (HGB A1C): Hemoglobin A1C: 7.5 % — AB (ref 4.0–5.6)

## 2019-12-30 MED ORDER — INSULIN LISPRO (1 UNIT DIAL) 100 UNIT/ML (KWIKPEN): PEN_INJECTOR | SUBCUTANEOUS | 9 refills | Status: DC

## 2019-12-30 NOTE — Progress Notes (Signed)
Subjective:    Patient ID: Ethan Mcneil, male    DOB: 1933-12-19, 86 y.o.   MRN: [id number]  HPI Pt returns for f/u of diabetes mellitus: DM type: pancreatic insuff. Dx'ed: 5643 Complications: polyneuropathy, renal insuff, CAD, and PAD.   Therapy: insulin since 2017 DKA: never Severe hypoglycemia: never Pancreatitis: once (2017) Pancreatic imaging: chronic atrophy with diffuse pancreatic duct dilatation.   SDOH: frail elderly state Other: he takes multiple daily injections; based on the pattern of cbg's, he does not need basal insulin; he declines pump.  Interval history: pt states cbg's vary from 100-165.  He checks fasting only.  pt states slight lightheadedness in the afternoon.  He does not check cbg then.   Past Medical History:  Diagnosis Date  . Arthritis   . CAD (coronary artery disease)    CABG 1997; grafts patent @ cath [facility] 11/09  . Chronic kidney disease   . DM2 (diabetes mellitus, type 2) ([place])   . Gout    after tick bite  . HLD (hyperlipidemia)   . HTN (hypertension)    essential nos  . Hypothyroid   . Postsurgical aortocoronary bypass status   . PVD (peripheral vascular disease) ([place])   . S/P herniorrhaphy   . Tick fever 2012   [facility] , [place]  . Unspecified hyperplasia of prostate without urinary obstruction and other lower urinary tract symptoms (LUTS)   . Urolithiasis 2015   hx of    Past Surgical History:  Procedure Laterality Date  . CORONARY ARTERY BYPASS GRAFT     x7 vessels 1997; cath 2002 and 2009; grafts patent   . CYSTOSCOPY WITH RETROGRADE PYELOGRAM, URETEROSCOPY AND STENT PLACEMENT Right 02/21/2014   Procedure: CYSTOSCOPY WITH RIGHT  RETROGRADE PYELOGRAM,RIGHT  URETEROSCOPY WITH STONE BASKETING EXTRACTION;  Surgeon: Irine Seal, MD;  Location: [facility];  Service: Urology;  Laterality: Right;  . INGUINAL HERNIA REPAIR    . SHOULDER SURGERY      Social History   Socioeconomic History  . Marital status: Married    Spouse  name: [name]  . Number of children: 4  . Years of education: Not on file  . Highest education level: Not on file  Occupational History  . Occupation: retired, works few hours   Tobacco Use  . Smoking status: Never Smoker  . Smokeless tobacco: Never Used  Substance and Sexual Activity  . Alcohol use: No  . Drug use: No  . Sexual activity: Not Currently  Other Topics Concern  . Not on file  Social History Narrative   Buyer, retail and former race car driver   4 children (boys)   Designated party form signed appointing wife - [name]; ok to leave msg on home phone [phone]         Social Determinants of Health   Financial Resource Strain:   . Difficulty of Paying Living Expenses: Not on file  Food Insecurity:   . Worried About Charity fundraiser in the Last Year: Not on file  . Ran Out of Food in the Last Year: Not on file  Transportation Needs:   . Lack of Transportation (Medical): Not on file  . Lack of Transportation (Non-Medical): Not on file  Physical Activity:   . Days of Exercise per Week: Not on file  . Minutes of Exercise per Session: Not on file  Stress:   . Feeling of Stress : Not on file  Social Connections:   . Frequency of Communication with  Friends and Family: Not on file  . Frequency of Social Gatherings with Friends and Family: Not on file  . Attends Religious Services: Not on file  . Active Member of Clubs or Organizations: Not on file  . Attends Archivist Meetings: Not on file  . Marital Status: Not on file  Intimate Partner Violence:   . Fear of Current or Ex-Partner: Not on file  . Emotionally Abused: Not on file  . Physically Abused: Not on file  . Sexually Abused: Not on file    Current Outpatient Medications on File Prior to Visit  Medication Sig Dispense Refill  . aspirin EC 81 MG tablet Take 81 mg by mouth daily.    [place] Kitchen augmented betamethasone dipropionate (DIPROLENE-AF) 0.05 % cream     . Cyanocobalamin (B-12) 1000 MCG  TABS Take 1,000 mcg by mouth daily.    . ferrous sulfate 325 (65 FE) MG tablet Take 1 tablet (325 mg total) by mouth daily with breakfast.    . glucose blood (FREESTYLE LITE) test strip CHECK BLOOD SUGAR NO MORE THAN TWICE DAILY. 200 strip 0  . Insulin Pen Needle 31G X 5 MM MISC To use w/ Basaglar (Patient taking differently: 1 each by Other route 3 (three) times daily. ) 100 each 5  . levothyroxine (SYNTHROID) 125 MCG tablet Take 1 tablet (125 mcg total) by mouth daily before breakfast. 90 tablet 1  . metoprolol succinate (TOPROL-XL) 25 MG 24 hr tablet Take 0.5 tablets (12.5 mg total) by mouth daily. 45 tablet 1  . Pancrelipase, Lip-Prot-Amyl, (ZENPEP) 25000 units CPEP Take 2 with meals and snacks 810 capsule 3  . ramipril (ALTACE) 2.5 MG capsule Take 1 capsule (2.5 mg total) by mouth daily. 90 capsule 1  . simvastatin (ZOCOR) 20 MG tablet Take 1 tablet (20 mg total) by mouth at bedtime. 90 tablet 1  . Vitamin D, Ergocalciferol, (DRISDOL) 1.25 MG (50000 UT) CAPS capsule Take 1 capsule (50,000 Units total) by mouth every 7 (seven) days. 4 capsule 3   No current facility-administered medications on file prior to visit.    Allergies  Allergen Reactions  . Codeine Nausea And Vomiting    Family History  Problem Relation Age of Onset  . Lung cancer Father   . Coronary artery disease Mother        stent  . Diabetes Brother   . Prostate cancer Maternal Uncle        in his 27s  . Stroke Son        GF  . Throat cancer Son   . Colon cancer Neg Hx   . Rectal cancer Neg Hx   . Stomach cancer Neg Hx   . Esophageal cancer Neg Hx   . Pancreatic cancer Neg Hx     BP 104/60 (BP Location: Right Arm, Patient Position: Sitting, Cuff Size: Normal)   Pulse 80   Ht 5\' 10"  (1.778 m)   Wt 147 lb 9.6 oz (67 kg)   SpO2 92%   BMI 21.18 kg/m    Review of Systems Denies LOC    Objective:   Physical Exam VITAL SIGNS:  See vs page GENERAL: no distress Pulses: dorsalis pedis intact bilat.   MSK:  no deformity of the feet CV: 2+ left leg edema, and 1+ on the right Skin:  no ulcer on the feet.  normal color and temp on the feet. Neuro: sensation is intact to touch on the feet Ext: there is bilateral onychomycosis of the  toenails.    A1c=7.5%     Assessment & Plan:  DM, due to pancreatic insuff, with PAD: well-controlled Renal insuff: in this setting, he does not need basal insulin Lightheadedness: we should reduce the lunch insulin, as hypoglycemia is a possible cause  Patient Instructions  check your blood sugar 4 times a day.  vary the time of day when you check, between before the 3 meals, and at bedtime.  also check if you have symptoms of your blood sugar being too high or too low.  please keep a record of the readings and bring it to your next appointment here (or you can bring the meter itself).  You can write it on any piece of paper.  please call us sooner if your blood sugar goes below 70, or if you have a lot of readings over 200.   Please reduce the humalog to 3 times a day (just before each meal), 6-5-9 units.  If you have lightheadedness, it is best to check the blood sugar then.   Please come back for a follow-up appointment in 4 months.

## 2019-12-30 NOTE — Patient Instructions (Addendum)
check your blood sugar 4 times a day.  vary the time of day when you check, between before the 3 meals, and at bedtime.  also check if you have symptoms of your blood sugar being too high or too low.  please keep a record of the readings and bring it to your next appointment here (or you can bring the meter itself).  You can write it on any piece of paper.  please call us sooner if your blood sugar goes below 70, or if you have a lot of readings over 200.   Please reduce the humalog to 3 times a day (just before each meal), 6-5-9 units.  If you have lightheadedness, it is best to check the blood sugar then.   Please come back for a follow-up appointment in 4 months.

## 2020-01-13 DIAGNOSIS — Z23 Encounter for immunization: Secondary | ICD-10-CM | POA: Diagnosis not present

## 2020-02-10 DIAGNOSIS — Z23 Encounter for immunization: Secondary | ICD-10-CM | POA: Diagnosis not present

## 2020-02-15 ENCOUNTER — Other Ambulatory Visit: Payer: Self-pay | Admitting: Internal Medicine

## 2020-02-18 ENCOUNTER — Other Ambulatory Visit: Payer: Self-pay | Admitting: Endocrinology

## 2020-03-02 ENCOUNTER — Other Ambulatory Visit: Payer: Self-pay

## 2020-03-02 ENCOUNTER — Other Ambulatory Visit: Payer: Self-pay | Admitting: Internal Medicine

## 2020-03-02 DIAGNOSIS — E038 Other specified hypothyroidism: Secondary | ICD-10-CM

## 2020-03-02 DIAGNOSIS — D649 Anemia, unspecified: Secondary | ICD-10-CM

## 2020-03-02 DIAGNOSIS — I10 Essential (primary) hypertension: Secondary | ICD-10-CM

## 2020-03-02 DIAGNOSIS — E782 Mixed hyperlipidemia: Secondary | ICD-10-CM

## 2020-03-02 DIAGNOSIS — E559 Vitamin D deficiency, unspecified: Secondary | ICD-10-CM

## 2020-03-02 NOTE — Telephone Encounter (Signed)
See last OV note on 12/23/2019- blood work was needed- I don't see orders pending and he hasn't had labs since 02/23/2019. Please advise.

## 2020-03-02 NOTE — Telephone Encounter (Signed)
Rx's refilled. Labs ordered. LMOM informing Pt that I was calling to schedule lab appt for OV in 12/2019. Asked that he call back to schedule at his earliest convenience.

## 2020-03-02 NOTE — Telephone Encounter (Signed)
Call patient: Schedule a lab visit and send in 1 month supply of since. Check: CMP: HTN FLP: High cholesterol TSH: Hypothyroidism. CBC: Mild anemia Vitamin D: Low vitamin D

## 2020-03-03 ENCOUNTER — Other Ambulatory Visit (INDEPENDENT_AMBULATORY_CARE_PROVIDER_SITE_OTHER): Payer: Medicare Other

## 2020-03-03 ENCOUNTER — Other Ambulatory Visit: Payer: Self-pay

## 2020-03-03 DIAGNOSIS — E559 Vitamin D deficiency, unspecified: Secondary | ICD-10-CM | POA: Diagnosis not present

## 2020-03-03 DIAGNOSIS — D649 Anemia, unspecified: Secondary | ICD-10-CM | POA: Diagnosis not present

## 2020-03-03 DIAGNOSIS — I10 Essential (primary) hypertension: Secondary | ICD-10-CM | POA: Diagnosis not present

## 2020-03-03 DIAGNOSIS — E038 Other specified hypothyroidism: Secondary | ICD-10-CM | POA: Diagnosis not present

## 2020-03-03 DIAGNOSIS — E782 Mixed hyperlipidemia: Secondary | ICD-10-CM

## 2020-03-03 LAB — CBC WITH DIFFERENTIAL/PLATELET
Basophils Absolute: 0.1 10*3/uL (ref 0.0–0.1)
Basophils Relative: 1.5 % (ref 0.0–3.0)
Eosinophils Absolute: 0.6 10*3/uL (ref 0.0–0.7)
Eosinophils Relative: 8.2 % — ABNORMAL HIGH (ref 0.0–5.0)
HCT: 32.5 % — ABNORMAL LOW (ref 39.0–52.0)
Hemoglobin: 10.9 g/dL — ABNORMAL LOW (ref 13.0–17.0)
Lymphocytes Relative: 23.4 % (ref 12.0–46.0)
Lymphs Abs: 1.7 10*3/uL (ref 0.7–4.0)
MCHC: 33.5 g/dL (ref 30.0–36.0)
MCV: 92.3 fl (ref 78.0–100.0)
Monocytes Absolute: 0.5 10*3/uL (ref 0.1–1.0)
Monocytes Relative: 6.7 % (ref 3.0–12.0)
Neutro Abs: 4.3 10*3/uL (ref 1.4–7.7)
Neutrophils Relative %: 60.2 % (ref 43.0–77.0)
Platelets: 163 10*3/uL (ref 150.0–400.0)
RBC: 3.52 Mil/uL — ABNORMAL LOW (ref 4.22–5.81)
RDW: 14 % (ref 11.5–15.5)
WBC: 7.2 10*3/uL (ref 4.0–10.5)

## 2020-03-03 LAB — COMPREHENSIVE METABOLIC PANEL
ALT: 33 U/L (ref 0–53)
AST: 23 U/L (ref 0–37)
Albumin: 3.1 g/dL — ABNORMAL LOW (ref 3.5–5.2)
Alkaline Phosphatase: 70 U/L (ref 39–117)
BUN: 17 mg/dL (ref 6–23)
CO2: 26 mEq/L (ref 19–32)
Calcium: 8.3 mg/dL — ABNORMAL LOW (ref 8.4–10.5)
Chloride: 105 mEq/L (ref 96–112)
Creatinine, Ser: 1.59 mg/dL — ABNORMAL HIGH (ref 0.40–1.50)
GFR: 41.73 mL/min — ABNORMAL LOW (ref >60.00)
Glucose, Bld: 303 mg/dL — ABNORMAL HIGH (ref 70–99)
Potassium: 4.7 mEq/L (ref 3.5–5.1)
Sodium: 134 mEq/L — ABNORMAL LOW (ref 135–145)
Total Bilirubin: 0.4 mg/dL (ref 0.2–1.2)
Total Protein: 5 g/dL — ABNORMAL LOW (ref 6.0–8.3)

## 2020-03-03 LAB — LIPID PANEL
Cholesterol: 111 mg/dL (ref 0–200)
HDL: 52.1 mg/dL (ref >39.00)
LDL Cholesterol: 44 mg/dL (ref 0–99)
NonHDL: 59.07
Total CHOL/HDL Ratio: 2
Triglycerides: 74 mg/dL (ref 0.0–149.0)
VLDL: 14.8 mg/dL (ref 0.0–40.0)

## 2020-03-03 LAB — TSH: TSH: 0.11 u[IU]/mL — ABNORMAL LOW (ref 0.35–4.50)

## 2020-03-03 LAB — VITAMIN D 25 HYDROXY (VIT D DEFICIENCY, FRACTURES): VITD: 10.55 ng/mL — ABNORMAL LOW (ref 30.00–100.00)

## 2020-03-08 ENCOUNTER — Telehealth: Payer: Self-pay | Admitting: Internal Medicine

## 2020-03-08 DIAGNOSIS — N189 Chronic kidney disease, unspecified: Secondary | ICD-10-CM

## 2020-03-08 MED ORDER — VITAMIN D (ERGOCALCIFEROL) 1.25 MG (50000 UNIT) PO CAPS: 50000.0000 [IU] | ORAL_CAPSULE | ORAL | 0 refills | Status: DC

## 2020-03-08 MED ORDER — LEVOTHYROXINE SODIUM 112 MCG PO TABS: 112.0000 ug | ORAL_TABLET | Freq: Every day | ORAL | 3 refills | Status: DC

## 2020-03-08 NOTE — Telephone Encounter (Signed)
Spoke w/ Pt- informed of results and recommendations. Pt verbalized understanding. Appt scheduled w/ PCP on 04/21/2020.

## 2020-03-08 NOTE — Telephone Encounter (Signed)
LMOM informing Pt to return call. Nephrology referral placed. Synthroid 155mcg and Ergocalciferol sent to [facility].

## 2020-03-08 NOTE — Telephone Encounter (Signed)
Labs reviewed, please call the patient: -Cholesterol is very good -Blood sugar is 303, recommend to discuss with endocrinology -Kidney function continued to be decreased, arrange a nonurgent nephrology referral DX CRI -Anemia mild and stable -Vitamin D is low, if do not on OTCs already recommend OTC vitamin D 1000 units daily -Also ergocalciferol 50,000 units weekly, send Rx #4 and 3 refills. -Needs less levothyroxine: Switch to 112 mcg, 1 tablet daily, #30, 3 refills -Arrange office visit with me in 6 weeks, in person

## 2020-03-31 ENCOUNTER — Other Ambulatory Visit: Payer: Self-pay | Admitting: Internal Medicine

## 2020-04-10 ENCOUNTER — Other Ambulatory Visit: Payer: Self-pay | Admitting: Internal Medicine

## 2020-04-21 ENCOUNTER — Encounter: Payer: Self-pay | Admitting: Internal Medicine

## 2020-04-21 ENCOUNTER — Other Ambulatory Visit: Payer: Self-pay

## 2020-04-21 ENCOUNTER — Ambulatory Visit (INDEPENDENT_AMBULATORY_CARE_PROVIDER_SITE_OTHER): Payer: Medicare Other | Admitting: Internal Medicine

## 2020-04-21 VITALS — BP 154/50 | HR 44 | Temp 97.1°F | Resp 18 | Ht 70.0 in | Wt 143.2 lb

## 2020-04-21 DIAGNOSIS — E038 Other specified hypothyroidism: Secondary | ICD-10-CM

## 2020-04-21 DIAGNOSIS — I10 Essential (primary) hypertension: Secondary | ICD-10-CM

## 2020-04-21 DIAGNOSIS — E559 Vitamin D deficiency, unspecified: Secondary | ICD-10-CM

## 2020-04-21 DIAGNOSIS — K8689 Other specified diseases of pancreas: Secondary | ICD-10-CM | POA: Diagnosis not present

## 2020-04-21 DIAGNOSIS — D649 Anemia, unspecified: Secondary | ICD-10-CM

## 2020-04-21 LAB — CBC WITH DIFFERENTIAL/PLATELET
Basophils Absolute: 0.1 10*3/uL (ref 0.0–0.1)
Basophils Relative: 1.5 % (ref 0.0–3.0)
Eosinophils Absolute: 0.5 10*3/uL (ref 0.0–0.7)
Eosinophils Relative: 8.3 % — ABNORMAL HIGH (ref 0.0–5.0)
HCT: 31.4 % — ABNORMAL LOW (ref 39.0–52.0)
Hemoglobin: 10.4 g/dL — ABNORMAL LOW (ref 13.0–17.0)
Lymphocytes Relative: 23 % (ref 12.0–46.0)
Lymphs Abs: 1.3 10*3/uL (ref 0.7–4.0)
MCHC: 33.1 g/dL (ref 30.0–36.0)
MCV: 92.1 fl (ref 78.0–100.0)
Monocytes Absolute: 0.4 10*3/uL (ref 0.1–1.0)
Monocytes Relative: 7.2 % (ref 3.0–12.0)
Neutro Abs: 3.5 10*3/uL (ref 1.4–7.7)
Neutrophils Relative %: 60 % (ref 43.0–77.0)
Platelets: 169 10*3/uL (ref 150.0–400.0)
RBC: 3.41 Mil/uL — ABNORMAL LOW (ref 4.22–5.81)
RDW: 14.7 % (ref 11.5–15.5)
WBC: 5.9 10*3/uL (ref 4.0–10.5)

## 2020-04-21 LAB — TSH: TSH: 0.25 u[IU]/mL — ABNORMAL LOW (ref 0.35–4.50)

## 2020-04-21 LAB — VITAMIN D 25 HYDROXY (VIT D DEFICIENCY, FRACTURES): VITD: 13.18 ng/mL — ABNORMAL LOW (ref 30.00–100.00)

## 2020-04-21 MED ORDER — METOPROLOL SUCCINATE ER 25 MG PO TB24: 12.5000 mg | ORAL_TABLET | Freq: Every day | ORAL | 1 refills | Status: DC

## 2020-04-21 NOTE — Patient Instructions (Addendum)
Per our records you are due for an eye exam. Please contact your eye doctor to schedule an appointment. Please have them send copies of your office visit notes to [place]. Our fax number is [phone].   Continue checking your blood pressure twice a week BP GOAL is between 110/65 and  135/85. If it is consistently higher or lower, let me know  To protect your kidneys be sure you are always well hydrated, make sure your blood pressure and diabetes are well controlled. Do not take any NSAIDs such as ibuprofen, naproxen.  Aspirin is okay.     GO TO THE LAB : Get the blood work     GO TO THE FRONT DESK, PLEASE SCHEDULE YOUR APPOINTMENTS Come back for   A check up in 4 months

## 2020-04-21 NOTE — Progress Notes (Signed)
Subjective:    Patient ID: Ethan Mcneil, male    DOB: May 01, 1934, 86 y.o.   MRN: [id number]  DOS:  04/21/2020 Type of visit - description: Routine follow-up Today we talk about CAD, vitamin D deficiency, anemia, thyroid disease, hypertension, CKD. In general he feels well.  Good compliance with medication  BP Readings from Last 3 Encounters:  04/21/20 (!) 154/50  12/30/19 104/60  02/08/19 118/66   Wt Readings from Last 3 Encounters:  04/21/20 143 lb 4 oz (65 kg)  12/30/19 147 lb 9.6 oz (67 kg)  02/08/19 146 lb 4 oz (66.3 kg)     Review of Systems Denies chest pain no difficulty breathing. No claudication No nausea, vomiting, diarrhea. No lower extremity paresthesias.  Past Medical History:  Diagnosis Date  . Arthritis   . CAD (coronary artery disease)    CABG 1997; grafts patent @ cath [facility] 11/09  . Chronic kidney disease   . DM2 (diabetes mellitus, type 2) ([place])   . Gout    after tick bite  . HLD (hyperlipidemia)   . HTN (hypertension)    essential nos  . Hypothyroid   . Postsurgical aortocoronary bypass status   . PVD (peripheral vascular disease) ([place])   . S/P herniorrhaphy   . Tick fever 2012   [facility] , [place]  . Unspecified hyperplasia of prostate without urinary obstruction and other lower urinary tract symptoms (LUTS)   . Urolithiasis 2015   hx of    Past Surgical History:  Procedure Laterality Date  . CORONARY ARTERY BYPASS GRAFT     x7 vessels 1997; cath 2002 and 2009; grafts patent   . CYSTOSCOPY WITH RETROGRADE PYELOGRAM, URETEROSCOPY AND STENT PLACEMENT Right 02/21/2014   Procedure: CYSTOSCOPY WITH RIGHT  RETROGRADE PYELOGRAM,RIGHT  URETEROSCOPY WITH STONE BASKETING EXTRACTION;  Surgeon: Irine Seal, MD;  Location: [facility];  Service: Urology;  Laterality: Right;  . INGUINAL HERNIA REPAIR    . SHOULDER SURGERY      Allergies as of 04/21/2020      Reactions   Codeine Nausea And Vomiting      Medication List       Accurate as  of Apr 21, 2020 11:59 PM. If you have any questions, ask your nurse or doctor.        aspirin EC 81 MG tablet Take 81 mg by mouth daily.   augmented betamethasone dipropionate 0.05 % cream Commonly known as: DIPROLENE-AF   B-12 1000 MCG Tabs Take 1,000 mcg by mouth daily.   B-D UF III MINI PEN NEEDLES 31G X 5 MM Misc Generic drug: Insulin Pen Needle 1 each by Other route 3 (three) times daily. E11.9   ferrous sulfate 325 (65 FE) MG tablet Take 1 tablet (325 mg total) by mouth daily with breakfast.   FREESTYLE LITE test strip Generic drug: glucose blood CHECK BLOOD SUGAR NO MORE THAN TWICE DAILY.   insulin lispro 100 UNIT/ML KwikPen Commonly known as: HumaLOG KwikPen 3 times a day (just before each meal), 6-5-9 units, and pen needles 3/day   levothyroxine 112 MCG tablet Commonly known as: SYNTHROID Take 1 tablet (112 mcg total) by mouth daily before breakfast.   metoprolol succinate 25 MG 24 hr tablet Commonly known as: TOPROL-XL Take 0.5 tablets (12.5 mg total) by mouth daily.   Pancrelipase (Lip-Prot-Amyl) 25000 units Cpep Commonly known as: Zenpep Take 2 with meals and snacks   ramipril 2.5 MG capsule Commonly known as: ALTACE Take 1 capsule (2.5 mg  total) by mouth daily.   simvastatin 20 MG tablet Commonly known as: ZOCOR Take 1 tablet (20 mg total) by mouth at bedtime.   Vitamin D (Ergocalciferol) 1.25 MG (50000 UNIT) Caps capsule Commonly known as: DRISDOL Take 1 capsule (50,000 Units total) by mouth every 7 (seven) days.          Objective:   Physical Exam BP (!) 154/50 (BP Location: Left Arm, Patient Position: Sitting, Cuff Size: Small)   Pulse (!) 44   Temp (!) 97.1 F (36.2 C) (Temporal)   Resp 18   Ht 5\' 10"  (1.778 m)   Wt 143 lb 4 oz (65 kg)   SpO2 100%   BMI 20.55 kg/m  General:   Well developed, NAD, BMI noted. HEENT:  Normocephalic . Face symmetric, atraumatic Lungs:  CTA B Normal respiratory effort, no intercostal retractions, no  accessory muscle use. Heart: RRR,  no murmur.  DM foot exam: Pinprick normal, skin normal, left leg slightly swollen compared to the right, at baseline.  Extremities are warm and with good capillary refill. Skin: Not pale. Not jaundice Neurologic:  alert & oriented X3.  Speech normal, gait appropriate for age and unassisted Psych--  Cognition and judgment appear intact.  Cooperative with normal attention span and concentration.  Behavior appropriate. No anxious or depressed appearing.      Assessment      Assessment DM -- w/ CAD, no neuropathy, intolerant to metformin , sees ENDO HTN Hyperlipidemia Hypothyroidism DJD BPH, LUTS,h/o urolithiasis -- sees urology CV: --CAD, CABG 1997, cardiac cath 2009 --Peripheral vascular disease (no ABIs in the chart) --L leg edema, chronic, (-) DVT per [place] 2015 Gout GI: Pancreatic insufficiency dx 02-2017 Anemia: Mild, normal iron 2015 B12 deficiency, mild 2015  Wt loss:  2012 187 lb, 2014 173 lb , 2015 167 01-2017: EGD normal, BX chronic active gastritis. Colonoscopy normal. Saw GI 02-2017: likely d/t Pancreatic insufficiency and poorly controlled diabetes. Wt better w/ pancreat enzymes  Liver  lesion per Ct, liver MRI 05-2017 benign, no further workup  H/o Tick fever 2012   PLAN: DM: Saw Endo 12/30/2019, feet exam negative today HTN: BP today slightly elevated for the first time, at home he started to check w/ a wrist cuff: SBP ~ 160s.  Unclear if that is accurate.  We agreed on continue metoprolol, Altace, monitor BPs at home, goals provided, call if not at goal (will need a nurse visit to check BP). Hypothyroidism: Last TSH decreased, levothyroxine adjusted, currently on 112 mcg, check a TSH CKD: Referred to nephrology 02-2020.  Has not been contacted just yet, not unusual, may take months for them to reach out. We talk about ways to protect his kidneys including avoid NSAIDs, good hydration, good DM and BP control. Vit D deficiency: Last  vitamin D was very  low, he was already on OTCs, prescribed ergocalciferol, good compliance, checking levels today Mild iron deficiency anemia: Rechecking CBC and TIBC.  On supplements. Pancreatic insufficiency: Denies nausea, vomiting, diarrhea.  On Zenpep. RTC 4 months    This visit occurred during the SARS-CoV-2 public health emergency.  Safety protocols were in place, including screening questions prior to the visit, additional usage of staff PPE, and extensive cleaning of exam room while observing appropriate contact time as indicated for disinfecting solutions.

## 2020-04-21 NOTE — Progress Notes (Signed)
Pre visit review using our clinic review tool, if applicable. No additional management support is needed unless otherwise documented below in the visit note. 

## 2020-04-22 LAB — IRON,TIBC AND FERRITIN PANEL
%SAT: 20 % (calc) (ref 20–48)
Ferritin: 23 ng/mL — ABNORMAL LOW (ref 24–380)
Iron: 47 ug/dL — ABNORMAL LOW (ref 50–180)
TIBC: 241 mcg/dL (calc) — ABNORMAL LOW (ref 250–425)

## 2020-04-22 NOTE — Assessment & Plan Note (Signed)
DM: Saw Endo 12/30/2019, feet exam negative today HTN: BP today slightly elevated for the first time, at home he started to check w/ a wrist cuff: SBP ~ 160s.  Unclear if that is accurate.  We agreed on continue metoprolol, Altace, monitor BPs at home, goals provided, call if not at goal (will need a nurse visit to check BP). Hypothyroidism: Last TSH decreased, levothyroxine adjusted, currently on 112 mcg, check a TSH CKD: Referred to nephrology 02-2020.  Has not been contacted just yet, not unusual, may take months for them to reach out. We talk about ways to protect his kidneys including avoid NSAIDs, good hydration, good DM and BP control. Vit D deficiency: Last vitamin D was very  low, he was already on OTCs, prescribed ergocalciferol, good compliance, checking levels today Mild iron deficiency anemia: Rechecking CBC and TIBC.  On supplements. Pancreatic insufficiency: Denies nausea, vomiting, diarrhea.  On Zenpep. RTC 4 months

## 2020-04-25 MED ORDER — FERROUS SULFATE 325 (65 FE) MG PO TABS: 325.0000 mg | ORAL_TABLET | Freq: Two times a day (BID) | ORAL | 6 refills | Status: DC

## 2020-04-25 MED ORDER — LEVOTHYROXINE SODIUM 100 MCG PO TABS: 100.0000 ug | ORAL_TABLET | Freq: Every day | ORAL | 1 refills | Status: DC

## 2020-04-25 MED ORDER — VITAMIN D (ERGOCALCIFEROL) 1.25 MG (50000 UNIT) PO CAPS: 50000.0000 [IU] | ORAL_CAPSULE | ORAL | 0 refills | Status: DC

## 2020-04-25 NOTE — Addendum Note (Signed)
Addended byDamita Dunnings D on: 04/25/2020 02:16 PM   Modules accepted: Orders

## 2020-05-02 ENCOUNTER — Other Ambulatory Visit: Payer: Self-pay

## 2020-05-02 ENCOUNTER — Encounter: Payer: Self-pay | Admitting: Endocrinology

## 2020-05-02 ENCOUNTER — Ambulatory Visit (INDEPENDENT_AMBULATORY_CARE_PROVIDER_SITE_OTHER): Payer: Medicare Other | Admitting: Endocrinology

## 2020-05-02 VITALS — BP 140/70 | HR 55 | Ht 70.0 in | Wt 146.0 lb

## 2020-05-02 DIAGNOSIS — E1165 Type 2 diabetes mellitus with hyperglycemia: Secondary | ICD-10-CM

## 2020-05-02 DIAGNOSIS — E1151 Type 2 diabetes mellitus with diabetic peripheral angiopathy without gangrene: Secondary | ICD-10-CM

## 2020-05-02 DIAGNOSIS — IMO0002 Reserved for concepts with insufficient information to code with codable children: Secondary | ICD-10-CM

## 2020-05-02 LAB — POCT GLYCOSYLATED HEMOGLOBIN (HGB A1C): Hemoglobin A1C: 9.9 % — AB (ref 4.0–5.6)

## 2020-05-02 MED ORDER — INSULIN LISPRO (1 UNIT DIAL) 100 UNIT/ML (KWIKPEN): PEN_INJECTOR | SUBCUTANEOUS | 9 refills | Status: DC

## 2020-05-02 NOTE — Progress Notes (Signed)
Subjective:    Patient ID: Ethan Mcneil, male    DOB: 1934-07-16, 86 y.o.   MRN: [id number]  HPI Pt returns for f/u of diabetes mellitus: DM type: pancreatic insuff. Dx'ed: 8469 Complications: PN, CRI, CAD, and PAD.   Therapy: insulin since 2017 DKA: never Severe hypoglycemia: never Pancreatitis: once (2017) Pancreatic imaging: chronic atrophy with diffuse pancreatic duct dilatation.   SDOH: frail elderly state Other: he takes multiple daily injections; based on the pattern of cbg's, he does not need basal insulin; he declines pump.  Interval history: He brings his meter with his cbg's which I have reviewed today.  cbg's vary from 60-241.  He checks fasting only.   No recent steroids.  He is uncertain why G2X is higher today. Past Medical History:  Diagnosis Date  . Arthritis   . CAD (coronary artery disease)    CABG 1997; grafts patent @ cath [facility] 11/09  . Chronic kidney disease   . DM2 (diabetes mellitus, type 2) ([place])   . Gout    after tick bite  . HLD (hyperlipidemia)   . HTN (hypertension)    essential nos  . Hypothyroid   . Postsurgical aortocoronary bypass status   . PVD (peripheral vascular disease) ([place])   . S/P herniorrhaphy   . Tick fever 2012   [facility] , [place]  . Unspecified hyperplasia of prostate without urinary obstruction and other lower urinary tract symptoms (LUTS)   . Urolithiasis 2015   hx of    Past Surgical History:  Procedure Laterality Date  . CORONARY ARTERY BYPASS GRAFT     x7 vessels 1997; cath 2002 and 2009; grafts patent   . CYSTOSCOPY WITH RETROGRADE PYELOGRAM, URETEROSCOPY AND STENT PLACEMENT Right 02/21/2014   Procedure: CYSTOSCOPY WITH RIGHT  RETROGRADE PYELOGRAM,RIGHT  URETEROSCOPY WITH STONE BASKETING EXTRACTION;  Surgeon: Irine Seal, MD;  Location: [facility];  Service: Urology;  Laterality: Right;  . INGUINAL HERNIA REPAIR    . SHOULDER SURGERY      Social History   Socioeconomic History  . Marital status:  Married    Spouse name: [name]  . Number of children: 4  . Years of education: Not on file  . Highest education level: Not on file  Occupational History  . Occupation: retired, works few hours   Tobacco Use  . Smoking status: Never Smoker  . Smokeless tobacco: Never Used  Substance and Sexual Activity  . Alcohol use: No  . Drug use: No  . Sexual activity: Not Currently  Other Topics Concern  . Not on file  Social History Narrative   Buyer, retail and former race car driver   4 children (boys)   Designated party form signed appointing wife - [name]; ok to leave msg on home phone [phone]         Social Determinants of Health   Financial Resource Strain:   . Difficulty of Paying Living Expenses:   Food Insecurity:   . Worried About Charity fundraiser in the Last Year:   . Arboriculturist in the Last Year:   Transportation Needs:   . Film/video editor (Medical):   Marland Kitchen Lack of Transportation (Non-Medical):   Physical Activity:   . Days of Exercise per Week:   . Minutes of Exercise per Session:   Stress:   . Feeling of Stress :   Social Connections:   . Frequency of Communication with Friends and Family:   . Frequency of Social  Gatherings with Friends and Family:   . Attends Religious Services:   . Active Member of Clubs or Organizations:   . Attends Archivist Meetings:   Marland Kitchen Marital Status:   Intimate Partner Violence:   . Fear of Current or Ex-Partner:   . Emotionally Abused:   Marland Kitchen Physically Abused:   . Sexually Abused:     Current Outpatient Medications on File Prior to Visit  Medication Sig Dispense Refill  . aspirin EC 81 MG tablet Take 81 mg by mouth daily.    Marland Kitchen augmented betamethasone dipropionate (DIPROLENE-AF) 0.05 % cream     . Cyanocobalamin (B-12) 1000 MCG TABS Take 1,000 mcg by mouth daily.    . ferrous sulfate 325 (65 FE) MG tablet Take 1 tablet (325 mg total) by mouth 2 (two) times daily before a meal. 60 tablet 6  . glucose  blood (FREESTYLE LITE) test strip CHECK BLOOD SUGAR NO MORE THAN TWICE DAILY. 200 strip 0  . Insulin Pen Needle (B-D UF III MINI PEN NEEDLES) 31G X 5 MM MISC 1 each by Other route 3 (three) times daily. E11.9 90 each 2  . levothyroxine (SYNTHROID) 100 MCG tablet Take 1 tablet (100 mcg total) by mouth daily before breakfast. 30 tablet 1  . metoprolol succinate (TOPROL-XL) 25 MG 24 hr tablet Take 0.5 tablets (12.5 mg total) by mouth daily. 90 tablet 1  . Pancrelipase, Lip-Prot-Amyl, (ZENPEP) 25000 units CPEP Take 2 with meals and snacks 810 capsule 3  . ramipril (ALTACE) 2.5 MG capsule Take 1 capsule (2.5 mg total) by mouth daily. 90 capsule 0  . simvastatin (ZOCOR) 20 MG tablet Take 1 tablet (20 mg total) by mouth at bedtime. 90 tablet 1  . Vitamin D, Ergocalciferol, (DRISDOL) 1.25 MG (50000 UNIT) CAPS capsule Take 1 capsule (50,000 Units total) by mouth every 7 (seven) days. 12 capsule 0   No current facility-administered medications on file prior to visit.    Allergies  Allergen Reactions  . Codeine Nausea And Vomiting    Family History  Problem Relation Age of Onset  . Lung cancer Father   . Coronary artery disease Mother        stent  . Diabetes Brother   . Prostate cancer Maternal Uncle        in his 4s  . Stroke Son        GF  . Throat cancer Son   . Colon cancer Neg Hx   . Rectal cancer Neg Hx   . Stomach cancer Neg Hx   . Esophageal cancer Neg Hx   . Pancreatic cancer Neg Hx     BP 140/70   Pulse (!) 55   Ht 5\' 10"  (1.778 m)   Wt 146 lb (66.2 kg)   SpO2 98%   BMI 20.95 kg/m    Review of Systems Denies LOC    Objective:   Physical Exam VITAL SIGNS:  See vs page GENERAL: no distress Pulses: dorsalis pedis intact bilat.   MSK: no deformity of the feet CV: 2+ bilat leg edema Skin:  no ulcer on the feet.  normal color and temp on the feet. Neuro: sensation is intact to touch on the feet  Lab Results  Component Value Date   CREATININE 1.59 (H) 03/03/2020     BUN 17 03/03/2020   NA 134 (L) 03/03/2020   K 4.7 03/03/2020   CL 105 03/03/2020   CO2 26 03/03/2020      Lab Results  Component Value Date   HGBA1C 9.9 (A) 05/02/2020       Assessment & Plan:  DM, due to panc insuff: worse Hypoglycemia, due to insulin.  Noncompliance with cbg recording: we discussed need to vary the time of day.   Patient Instructions  check your blood sugar 4 times a day.  vary the time of day when you check, between before the 3 meals, and at bedtime.  also check if you have symptoms of your blood sugar being too high or too low.  please keep a record of the readings and bring it to your next appointment here (or you can bring the meter itself).  You can write it on any piece of paper.  please call us sooner if your blood sugar goes below 70, or if you have a lot of readings over 200.   Please increase the humalog to 3 times a day (just before each meal), 06-20-10 units.  If you have lightheadedness, it is best to check the blood sugar then.   Please come back for a follow-up appointment in 2 months.

## 2020-05-02 NOTE — Patient Instructions (Addendum)
check your blood sugar 4 times a day.  vary the time of day when you check, between before the 3 meals, and at bedtime.  also check if you have symptoms of your blood sugar being too high or too low.  please keep a record of the readings and bring it to your next appointment here (or you can bring the meter itself).  You can write it on any piece of paper.  please call us sooner if your blood sugar goes below 70, or if you have a lot of readings over 200.   Please increase the humalog to 3 times a day (just before each meal), 06-20-10 units.  If you have lightheadedness, it is best to check the blood sugar then.   Please come back for a follow-up appointment in 2 months.

## 2020-05-17 NOTE — Progress Notes (Signed)
Subjective:   Ethan Mcneil is a 86 y.o. male who presents for Medicare Annual/Subsequent preventive examination.  Pt states he was in the ER last night w/ stomach and rib pain. States pain has improved. Pt has PCP appt at 11am this morning to follow up.  Review of Systems: Home Safety/Smoke Alarms: Feels safe in home. Smoke alarms in place.  Lives w/ wife in 1 story home.  Male:   PSA-  Lab Results  Component Value Date   PSA 1.19 05/02/2010   PSA 1.06 03/24/2009   PSA 1.13 11/03/2007   Eye- pt states he will schedule.    Objective:    Vitals: BP 138/65 (BP Location: Left Arm, Patient Position: Sitting, Cuff Size: Normal)   Pulse (!) 50   Temp (!) 97 F (36.1 C) (Temporal)   Ht 5\' 10"  (1.778 m)   Wt 145 lb 6.4 oz (66 kg)   SpO2 98%   BMI 20.86 kg/m   Body mass index is 20.86 kg/m.  Wt Readings from Last 3 Encounters:  05/22/20 145 lb 6.4 oz (66 kg)  05/02/20 146 lb (66.2 kg)  04/21/20 143 lb 4 oz (65 kg)   Temp Readings from Last 3 Encounters:  05/22/20 (!) 97 F (36.1 C) (Temporal)  04/21/20 (!) 97.1 F (36.2 C) (Temporal)  02/08/19 97.9 F (36.6 C) (Oral)   BP Readings from Last 3 Encounters:  05/22/20 138/65  05/02/20 140/70  04/21/20 (!) 154/50   Pulse Readings from Last 3 Encounters:  05/22/20 (!) 50  05/02/20 (!) 55  04/21/20 (!) 44    Advanced Directives 05/22/2020 05/20/2019 05/18/2018 05/15/2017 08/08/2014 08/02/2014 07/08/2014  Does Patient Have a Medical Advance Directive? Yes No No Yes Yes Yes Patient would like information;Patient would not like information  Type of Advance Directive [place];Living will - - [place];Living will [place];Living will [place];Living will -  Does patient want to make changes to medical advance directive? No - Patient declined No - Patient declined - - No - Patient declined - -  Copy of [place] in Chart? No - copy  requested - - No - copy requested No - copy requested No - copy requested -  Would patient like information on creating a medical advance directive? - - Yes (MAU/Ambulatory/Procedural Areas - Information given) - - - -    Tobacco Social History   Tobacco Use  Smoking Status Never Smoker  Smokeless Tobacco Never Used     Counseling given: Not Answered   Clinical Intake: Pain : No/denies pain     Past Medical History:  Diagnosis Date  . Arthritis   . CAD (coronary artery disease)    CABG 1997; grafts patent @ cath [facility] 11/09  . Chronic kidney disease   . DM2 (diabetes mellitus, type 2) ([place])   . Gout    after tick bite  . HLD (hyperlipidemia)   . HTN (hypertension)    essential nos  . Hypothyroid   . Postsurgical aortocoronary bypass status   . PVD (peripheral vascular disease) ([place])   . S/P herniorrhaphy   . Tick fever 2012   [facility] [place]  . Unspecified hyperplasia of prostate without urinary obstruction and other lower urinary tract symptoms (LUTS)   . Urolithiasis 2015   hx of   Past Surgical History:  Procedure Laterality Date  . CORONARY ARTERY BYPASS GRAFT     x7 vessels  1997; cath 2002 and 2009; grafts patent   . CYSTOSCOPY WITH RETROGRADE PYELOGRAM, URETEROSCOPY AND STENT PLACEMENT Right 02/21/2014   Procedure: CYSTOSCOPY WITH RIGHT  RETROGRADE PYELOGRAM,RIGHT  URETEROSCOPY WITH STONE BASKETING EXTRACTION;  Surgeon: Irine Seal, MD;  Location: [facility];  Service: Urology;  Laterality: Right;  . INGUINAL HERNIA REPAIR    . SHOULDER SURGERY     Family History  Problem Relation Age of Onset  . Lung cancer Father   . Coronary artery disease Mother        stent  . Diabetes Brother   . Prostate cancer Maternal Uncle        in his 71s  . Stroke Son        GF  . Throat cancer Son   . Colon cancer Neg Hx   . Rectal cancer Neg Hx   . Stomach cancer Neg Hx   . Esophageal cancer Neg Hx   . Pancreatic cancer Neg Hx    Social History    Socioeconomic History  . Marital status: Married    Spouse name: [name]  . Number of children: 4  . Years of education: Not on file  . Highest education level: Not on file  Occupational History  . Occupation: retired, works few hours   Tobacco Use  . Smoking status: Never Smoker  . Smokeless tobacco: Never Used  Substance and Sexual Activity  . Alcohol use: No  . Drug use: No  . Sexual activity: Not Currently  Other Topics Concern  . Not on file  Social History Narrative   Buyer, retail and former race car driver   4 children (boys)   Designated party form signed appointing wife - [name]; ok to leave msg on home phone [phone]         Social Determinants of Health   Financial Resource Strain: Low Risk   . Difficulty of Paying Living Expenses: Not hard at all  Food Insecurity: No Food Insecurity  . Worried About Charity fundraiser in the Last Year: Never true  . Ran Out of Food in the Last Year: Never true  Transportation Needs: No Transportation Needs  . Lack of Transportation (Medical): No  . Lack of Transportation (Non-Medical): No  Physical Activity:   . Days of Exercise per Week:   . Minutes of Exercise per Session:   Stress:   . Feeling of Stress :   Social Connections:   . Frequency of Communication with Friends and Family:   . Frequency of Social Gatherings with Friends and Family:   . Attends Religious Services:   . Active Member of Clubs or Organizations:   . Attends Archivist Meetings:   Marland Kitchen Marital Status:     Outpatient Encounter Medications as of 05/22/2020  Medication Sig  . aspirin EC 81 MG tablet Take 81 mg by mouth daily.  Marland Kitchen augmented betamethasone dipropionate (DIPROLENE-AF) 0.05 % cream   . Cyanocobalamin (B-12) 1000 MCG TABS Take 1,000 mcg by mouth daily.  . ferrous sulfate 325 (65 FE) MG tablet Take 1 tablet (325 mg total) by mouth 2 (two) times daily before a meal.  . glucose blood (FREESTYLE LITE) test strip CHECK BLOOD  SUGAR NO MORE THAN TWICE DAILY.  Marland Kitchen insulin lispro (HUMALOG KWIKPEN) 100 UNIT/ML KwikPen 3 times a day (just before each meal), 06-20-10 units, and pen needles 3/day  . Insulin Pen Needle (B-D UF III MINI PEN NEEDLES) 31G X 5 MM MISC 1 each by Other  route 3 (three) times daily. E11.9  . levothyroxine (SYNTHROID) 100 MCG tablet Take 1 tablet (100 mcg total) by mouth daily before breakfast.  . metoprolol succinate (TOPROL-XL) 25 MG 24 hr tablet Take 0.5 tablets (12.5 mg total) by mouth daily.  . Pancrelipase, Lip-Prot-Amyl, (ZENPEP) 25000 units CPEP Take 2 with meals and snacks  . ramipril (ALTACE) 2.5 MG capsule Take 1 capsule (2.5 mg total) by mouth daily.  . simvastatin (ZOCOR) 20 MG tablet Take 1 tablet (20 mg total) by mouth at bedtime.  . Vitamin D, Ergocalciferol, (DRISDOL) 1.25 MG (50000 UNIT) CAPS capsule Take 1 capsule (50,000 Units total) by mouth every 7 (seven) days.  Marland Kitchen oxyCODONE-acetaminophen (PERCOCET/ROXICET) 5-325 MG tablet Take by mouth.  . [DISCONTINUED] levothyroxine (SYNTHROID) 100 MCG tablet Take 1 tablet (100 mcg total) by mouth daily before breakfast.  . [DISCONTINUED] ramipril (ALTACE) 2.5 MG capsule Take 1 capsule (2.5 mg total) by mouth daily.   No facility-administered encounter medications on file as of 05/22/2020.    Activities of Daily Living In your present state of health, do you have any difficulty performing the following activities: 05/22/2020  Hearing? N  Vision? N  Difficulty concentrating or making decisions? N  Walking or climbing stairs? N  Dressing or bathing? N  Doing errands, shopping? N  Preparing Food and eating ? N  Using the Toilet? N  In the past six months, have you accidently leaked urine? N  Do you have problems with loss of bowel control? N  Managing your Medications? N  Managing your Finances? N  Housekeeping or managing your Housekeeping? N  Some recent data might be hidden    Patient Care Team: Colon Branch, MD as PCP - General  (Internal Medicine) Festus Aloe, MD as Consulting Physician (Urology) Josue Hector, MD as Consulting Physician (Cardiology) Irene Shipper, MD as Consulting Physician (Gastroenterology) Renato Shin, MD as Consulting Physician (Endocrinology) Phylliss Blakes, OD as Consulting Physician (Optometry)   Assessment:   This is a routine wellness examination for [place]. Physical assessment deferred to PCP.  Exercise Activities and Dietary recommendations Current Exercise Habits: The patient does not participate in regular exercise at present, Exercise limited by: None identified   Diet (meal preparation, eat out, water intake, caffeinated beverages, dairy products, fruits and vegetables): 24 hr recall Breakfast: corkflakes. Lunch: potato salad Dinner: brisket, salad, corn  Goals    . Maintain current health and good blood sugar levels    . Maintain physical strength       Fall Risk Fall Risk  05/22/2020 05/20/2019 05/18/2018 05/15/2017 01/06/2017  Falls in the past year? 0 0 No No No  Number falls in past yr: 0 - - - -  Injury with Fall? 0 - - - -  Follow up Education provided;Falls prevention discussed - - - -    Depression Screen PHQ 2/9 Scores 05/22/2020 05/20/2019 05/18/2018 05/15/2017  PHQ - 2 Score 0 0 0 0    Cognitive Function   MMSE - Mini Mental State Exam 05/18/2018 05/15/2017  Orientation to time 5 5  Orientation to Place 5 5  Registration 3 3  Attention/ Calculation 5 4  Recall 3 2  Language- name 2 objects 2 2  Language- repeat 1 0  Language- follow 3 step command 3 3  Language- read & follow direction 1 1  Write a sentence 1 1  Copy design 1 1  Total score 30 27     6CIT Screen 05/22/2020  What  Year? 0 points  What month? 0 points  What time? 0 points  Count back from 20 2 points  Months in reverse 0 points  Repeat phrase 0 points  Total Score 2    Immunization History  Administered Date(s) Administered  . Fluad Quad(high Dose 65+) 09/17/2019  . Influenza  Split 10/29/2011, 11/04/2012  . Influenza Whole 11/03/2007, 10/09/2009  . Influenza, High Dose Seasonal PF 11/23/2015, 09/04/2016, 09/16/2017, 11/23/2018  . Influenza,inj,Quad PF,6+ Mos 10/10/2014  . Moderna SARS-COVID-2 Vaccination 01/13/2020, 02/10/2020  . Pneumococcal Conjugate-13 11/23/2015  . Pneumococcal Polysaccharide-23 05/10/2010  . Tdap 03/07/2014  . Zoster 06/07/2014   Screening Tests Health Maintenance  Topic Date Due  . OPHTHALMOLOGY EXAM  02/25/2019  . INFLUENZA VACCINE  07/16/2020  . HEMOGLOBIN A1C  11/02/2020  . FOOT EXAM  04/21/2021  . TETANUS/TDAP  03/07/2024  . COVID-19 Vaccine  Completed  . PNA vac Low Risk Adult  Completed       Plan:     Please schedule your next medicare wellness visit with me in 1 yr.  Continue to eat heart healthy diet (full of fruits, vegetables, whole grains, lean protein, water--limit salt, fat, and sugar intake) and increase physical activity as tolerated.  Continue doing brain stimulating activities (puzzles, reading, adult coloring books, staying active) to keep memory sharp.   Bring a copy of your living will and/or healthcare power of attorney to your next office visit.   I have personally reviewed and noted the following in the patient's chart:   . Medical and social history . Use of alcohol, tobacco or illicit drugs  . Current medications and supplements . Functional ability and status . Nutritional status . Physical activity . Advanced directives . List of other physicians . Hospitalizations, surgeries, and ER visits in previous 12 months . Vitals . Screenings to include cognitive, depression, and falls . Referrals and appointments  In addition, I have reviewed and discussed with patient certain preventive protocols, quality metrics, and best practice recommendations. A written personalized care plan for preventive services as well as general preventive health recommendations were provided to patient.     Shela Nevin, [place]  05/22/2020

## 2020-05-18 ENCOUNTER — Other Ambulatory Visit: Payer: Self-pay | Admitting: Internal Medicine

## 2020-05-19 ENCOUNTER — Other Ambulatory Visit: Payer: Self-pay | Admitting: Internal Medicine

## 2020-05-21 DIAGNOSIS — Z7982 Long term (current) use of aspirin: Secondary | ICD-10-CM | POA: Diagnosis not present

## 2020-05-21 DIAGNOSIS — Z79899 Other long term (current) drug therapy: Secondary | ICD-10-CM | POA: Diagnosis not present

## 2020-05-21 DIAGNOSIS — E7800 Pure hypercholesterolemia, unspecified: Secondary | ICD-10-CM | POA: Diagnosis not present

## 2020-05-21 DIAGNOSIS — K769 Liver disease, unspecified: Secondary | ICD-10-CM | POA: Diagnosis not present

## 2020-05-21 DIAGNOSIS — K8689 Other specified diseases of pancreas: Secondary | ICD-10-CM | POA: Diagnosis not present

## 2020-05-21 DIAGNOSIS — R1012 Left upper quadrant pain: Secondary | ICD-10-CM | POA: Diagnosis not present

## 2020-05-21 DIAGNOSIS — E039 Hypothyroidism, unspecified: Secondary | ICD-10-CM | POA: Diagnosis not present

## 2020-05-21 DIAGNOSIS — Z951 Presence of aortocoronary bypass graft: Secondary | ICD-10-CM | POA: Diagnosis not present

## 2020-05-21 DIAGNOSIS — I2510 Atherosclerotic heart disease of native coronary artery without angina pectoris: Secondary | ICD-10-CM | POA: Diagnosis not present

## 2020-05-21 DIAGNOSIS — K861 Other chronic pancreatitis: Secondary | ICD-10-CM | POA: Diagnosis not present

## 2020-05-21 DIAGNOSIS — I700 Atherosclerosis of aorta: Secondary | ICD-10-CM | POA: Diagnosis not present

## 2020-05-22 ENCOUNTER — Encounter: Payer: Self-pay | Admitting: *Deleted

## 2020-05-22 ENCOUNTER — Ambulatory Visit (INDEPENDENT_AMBULATORY_CARE_PROVIDER_SITE_OTHER): Payer: Medicare Other | Admitting: *Deleted

## 2020-05-22 ENCOUNTER — Encounter: Payer: Self-pay | Admitting: Internal Medicine

## 2020-05-22 ENCOUNTER — Ambulatory Visit (INDEPENDENT_AMBULATORY_CARE_PROVIDER_SITE_OTHER): Payer: Medicare Other | Admitting: Internal Medicine

## 2020-05-22 ENCOUNTER — Other Ambulatory Visit: Payer: Self-pay

## 2020-05-22 VITALS — BP 138/65 | HR 50 | Temp 97.0°F | Ht 70.0 in | Wt 145.4 lb

## 2020-05-22 DIAGNOSIS — K861 Other chronic pancreatitis: Secondary | ICD-10-CM | POA: Diagnosis not present

## 2020-05-22 DIAGNOSIS — Z0000 Encounter for general adult medical examination without abnormal findings: Secondary | ICD-10-CM | POA: Diagnosis not present

## 2020-05-22 DIAGNOSIS — K8689 Other specified diseases of pancreas: Secondary | ICD-10-CM

## 2020-05-22 NOTE — Patient Instructions (Signed)
Will contact you soon  Call if you have any unusual symptoms

## 2020-05-22 NOTE — Progress Notes (Signed)
Subjective:    Patient ID: Ethan Mcneil, male    DOB: 31-Aug-1934, 86 y.o.   MRN: [id number]  DOS:  05/22/2020 Type of visit - description: ER follow-up  Went to the ER at [facility] yesterday, reported sudden onset of pain on the the left rib cage and left shoulder after eating. Work-up as summarized below: CBC showed a hemoglobin of 10, lipase 10 (low)  PT normal, troponin negative, potassium 4.5, creatinine 1.9.  Blood sugar 315. Calcium 8.3 low.  Total protein 5.3 (low), AST, ALT, alkaline phosphate normal.  CT report: 1. Findings suspicious for 3.1 cm cystic and soft tissue pancreatic mass, not seen on prior exam, suspicious for neoplasm. Recommend further evaluation with pancreatic protocol MRI. Diffuse pancreatic atrophy and ductal dilatation, unchanged and likely sequela of chronic pancreatitis. 2. Wall thickening versus nondistention involving the splenic flexure of the colon, can be seen with colitis. 3. Multiple low-density lesions in the liver, similar to prior exam, previously characterized as cysts on MRI.  Wt Readings from Last 3 Encounters:  05/22/20 145 lb 6 oz (65.9 kg)  05/22/20 145 lb 6.4 oz (66 kg)  05/02/20 146 lb (66.2 kg)      Review of Systems Since  the ER visit, he is improving. Today he has minimal discomfort at the end of the left rib cage.  No other abdominal pain No fever chills No weight loss No nausea, vomiting, diarrhea No rash   Past Medical History:  Diagnosis Date  . Arthritis   . CAD (coronary artery disease)    CABG 1997; grafts patent @ cath [facility] 11/09  . Chronic kidney disease   . DM2 (diabetes mellitus, type 2) ([place])   . Gout    after tick bite  . HLD (hyperlipidemia)   . HTN (hypertension)    essential nos  . Hypothyroid   . Postsurgical aortocoronary bypass status   . PVD (peripheral vascular disease) ([place])   . S/P herniorrhaphy   . Tick fever 2012   [facility] [place]  . Unspecified hyperplasia of prostate  without urinary obstruction and other lower urinary tract symptoms (LUTS)   . Urolithiasis 2015   hx of    Past Surgical History:  Procedure Laterality Date  . CORONARY ARTERY BYPASS GRAFT     x7 vessels 1997; cath 2002 and 2009; grafts patent   . CYSTOSCOPY WITH RETROGRADE PYELOGRAM, URETEROSCOPY AND STENT PLACEMENT Right 02/21/2014   Procedure: CYSTOSCOPY WITH RIGHT  RETROGRADE PYELOGRAM,RIGHT  URETEROSCOPY WITH STONE BASKETING EXTRACTION;  Surgeon: Irine Seal, MD;  Location: [facility];  Service: Urology;  Laterality: Right;  . INGUINAL HERNIA REPAIR    . SHOULDER SURGERY      Allergies as of 05/22/2020      Reactions   Codeine Nausea And Vomiting      Medication List       Accurate as of May 22, 2020 11:09 AM. If you have any questions, ask your nurse or doctor.        aspirin EC 81 MG tablet Take 81 mg by mouth daily.   augmented betamethasone dipropionate 0.05 % cream Commonly known as: DIPROLENE-AF   B-12 1000 MCG Tabs Take 1,000 mcg by mouth daily.   B-D UF III MINI PEN NEEDLES 31G X 5 MM Misc Generic drug: Insulin Pen Needle 1 each by Other route 3 (three) times daily. E11.9   ferrous sulfate 325 (65 FE) MG tablet Take 1 tablet (325 mg total) by mouth 2 (  two) times daily before a meal.   FREESTYLE LITE test strip Generic drug: glucose blood CHECK BLOOD SUGAR NO MORE THAN TWICE DAILY.   insulin lispro 100 UNIT/ML KwikPen Commonly known as: HumaLOG KwikPen 3 times a day (just before each meal), 06-20-10 units, and pen needles 3/day   levothyroxine 100 MCG tablet Commonly known as: SYNTHROID Take 1 tablet (100 mcg total) by mouth daily before breakfast.   metoprolol succinate 25 MG 24 hr tablet Commonly known as: TOPROL-XL Take 0.5 tablets (12.5 mg total) by mouth daily.   oxyCODONE-acetaminophen 5-325 MG tablet Commonly known as: PERCOCET/ROXICET Take by mouth.   Pancrelipase (Lip-Prot-Amyl) 25000 units Cpep Commonly known as: Zenpep Take 2 with meals and  snacks   ramipril 2.5 MG capsule Commonly known as: ALTACE Take 1 capsule (2.5 mg total) by mouth daily.   simvastatin 20 MG tablet Commonly known as: ZOCOR Take 1 tablet (20 mg total) by mouth at bedtime.   Vitamin D (Ergocalciferol) 1.25 MG (50000 UNIT) Caps capsule Commonly known as: DRISDOL Take 1 capsule (50,000 Units total) by mouth every 7 (seven) days.          Objective:   Physical Exam BP 138/65   Pulse (!) 50   Temp (!) 97 F (36.1 C) (Temporal)   Ht 5\' 10"  (1.778 m)   Wt 145 lb 6 oz (65.9 kg)   SpO2 98%   BMI 20.86 kg/m  General:   Well developed, NAD, BMI noted.  HEENT:  Normocephalic . Face symmetric, atraumatic. Neck: No lymphadenopathy, no supraclavicular mass Lungs:  CTA B Normal respiratory effort, no intercostal retractions, no accessory muscle use. Heart: RRR,  no murmur.  Abdomen:  Not distended, soft, non-tender. No rebound or rigidity.  No mass. Skin: Not pale. Not jaundice Lower extremities: no pretibial edema bilaterally  Neurologic:  alert & oriented X3.  Speech normal, gait appropriate for age and unassisted Psych--  Cognition and judgment appear intact.  Cooperative with normal attention span and concentration.  Behavior appropriate. No anxious or depressed appearing.     Assessment    Assessment DM -- w/ CAD, no neuropathy, intolerant to metformin , sees ENDO HTN Hyperlipidemia Hypothyroidism DJD BPH, LUTS,h/o urolithiasis -- sees urology CV: --CAD, CABG 1997, cardiac cath 2009 --Peripheral vascular disease (no ABIs in the chart) --L leg edema, chronic, (-) DVT per [place] 2015 Gout GI: Pancreatic insufficiency dx 02-2017 Anemia: Mild, normal iron 2015 B12 deficiency, mild 2015  Wt loss:  2012 187 lb, 2014 173 lb , 2015 167 01-2017: EGD normal, BX chronic active gastritis. Colonoscopy normal. Saw GI 02-2017: likely d/t Pancreatic insufficiency and poorly controlled diabetes. Wt better w/ pancreat enzymes  Liver  lesion per  Ct, liver MRI 05-2017 benign, no further workup  H/o Tick fever 2012   PLAN: Pancreatic mass: New per CT yesterday, he seems to be asymptomatic, possibly a incidental finding. Discussed with GI: Proceed with MRI and they will follow up with the patient Discussed with radiology: Creatinine clearance is 51, okay to proceed with MRI of the abdomen, will order an MRI at with and without contrast using Gadavist. Will let patient know on    This visit occurred during the SARS-CoV-2 public health emergency.  Safety protocols were in place, including screening questions prior to the visit, additional usage of staff PPE, and extensive cleaning of exam room while observing appropriate contact time as indicated for disinfecting solutions.

## 2020-05-22 NOTE — Progress Notes (Signed)
Pre visit review using our clinic review tool, if applicable. No additional management support is needed unless otherwise documented below in the visit note. 

## 2020-05-22 NOTE — Patient Instructions (Signed)
Please schedule your next medicare wellness visit with me in 1 yr.  Continue to eat heart healthy diet (full of fruits, vegetables, whole grains, lean protein, water--limit salt, fat, and sugar intake) and increase physical activity as tolerated.  Continue doing brain stimulating activities (puzzles, reading, adult coloring books, staying active) to keep memory sharp.   Bring a copy of your living will and/or healthcare power of attorney to your next office visit.   [name] , Thank you for taking time to come for your Medicare Wellness Visit. I appreciate your ongoing commitment to your health goals. Please review the following plan we discussed and let me know if I can assist you in the future.   These are the goals we discussed: Goals    . Maintain current health and good blood sugar levels    . Maintain physical strength       This is a list of the screening recommended for you and due dates:  Health Maintenance  Topic Date Due  . Eye exam for diabetics  02/25/2019  . Flu Shot  07/16/2020  . Hemoglobin A1C  11/02/2020  . Complete foot exam   04/21/2021  . Tetanus Vaccine  03/07/2024  . COVID-19 Vaccine  Completed  . Pneumonia vaccines  Completed    Preventive Care 51 Years and Older, Male Preventive care refers to lifestyle choices and visits with your health care provider that can promote health and wellness. This includes:  A yearly physical exam. This is also called an annual well check.  Regular dental and eye exams.  Immunizations.  Screening for certain conditions.  Healthy lifestyle choices, such as diet and exercise. What can I expect for my preventive care visit? Physical exam Your health care provider will check:  Height and weight. These may be used to calculate body mass index (BMI), which is a measurement that tells if you are at a healthy weight.  Heart rate and blood pressure.  Your skin for abnormal spots. Counseling Your health care provider  may ask you questions about:  Alcohol, tobacco, and drug use.  Emotional well-being.  Home and relationship well-being.  Sexual activity.  Eating habits.  History of falls.  Memory and ability to understand (cognition).  Work and work Statistician. What immunizations do I need?  Influenza (flu) vaccine  This is recommended every year. Tetanus, diphtheria, and pertussis (Tdap) vaccine  You may need a Td booster every 10 years. Varicella (chickenpox) vaccine  You may need this vaccine if you have not already been vaccinated. Zoster (shingles) vaccine  You may need this after age 104. Pneumococcal conjugate (PCV13) vaccine  One dose is recommended after age 72. Pneumococcal polysaccharide (PPSV23) vaccine  One dose is recommended after age 81. Measles, mumps, and rubella (MMR) vaccine  You may need at least one dose of MMR if you were born in 1957 or later. You may also need a second dose. Meningococcal conjugate (MenACWY) vaccine  You may need this if you have certain conditions. Hepatitis A vaccine  You may need this if you have certain conditions or if you travel or work in places where you may be exposed to hepatitis A. Hepatitis B vaccine  You may need this if you have certain conditions or if you travel or work in places where you may be exposed to hepatitis B. Haemophilus influenzae type b (Hib) vaccine  You may need this if you have certain conditions. You may receive vaccines as individual doses or as more  than one vaccine together in one shot (combination vaccines). Talk with your health care provider about the risks and benefits of combination vaccines. What tests do I need? Blood tests  Lipid and cholesterol levels. These may be checked every 5 years, or more frequently depending on your overall health.  Hepatitis C test.  Hepatitis B test. Screening  Lung cancer screening. You may have this screening every year starting at age 25 if you have a  30-pack-year history of smoking and currently smoke or have quit within the past 15 years.  Colorectal cancer screening. All adults should have this screening starting at age 49 and continuing until age 26. Your health care provider may recommend screening at age 1 if you are at increased risk. You will have tests every 1-10 years, depending on your results and the type of screening test.  Prostate cancer screening. Recommendations will vary depending on your family history and other risks.  Diabetes screening. This is done by checking your blood sugar (glucose) after you have not eaten for a while (fasting). You may have this done every 1-3 years.  Abdominal aortic aneurysm (AAA) screening. You may need this if you are a current or former smoker.  Sexually transmitted disease (STD) testing. Follow these instructions at home: Eating and drinking  Eat a diet that includes fresh fruits and vegetables, whole grains, lean protein, and low-fat dairy products. Limit your intake of foods with high amounts of sugar, saturated fats, and salt.  Take vitamin and mineral supplements as recommended by your health care provider.  Do not drink alcohol if your health care provider tells you not to drink.  If you drink alcohol: ? Limit how much you have to 0-2 drinks a day. ? Be aware of how much alcohol is in your drink. In the [place] one drink equals one 12 oz bottle of beer (355 mL), one 5 oz glass of wine (148 mL), or one 1 oz glass of hard liquor (44 mL). Lifestyle  Take daily care of your teeth and gums.  Stay active. Exercise for at least 30 minutes on 5 or more days each week.  Do not use any products that contain nicotine or tobacco, such as cigarettes, e-cigarettes, and chewing tobacco. If you need help quitting, ask your health care provider.  If you are sexually active, practice safe sex. Use a condom or other form of protection to prevent STIs (sexually transmitted infections).  Talk  with your health care provider about taking a low-dose aspirin or statin. What's next?  Visit your health care provider once a year for a well check visit.  Ask your health care provider how often you should have your eyes and teeth checked.  Stay up to date on all vaccines. This information is not intended to replace advice given to you by your health care provider. Make sure you discuss any questions you have with your health care provider. Document Revised: 11/26/2018 Document Reviewed: 11/26/2018 Elsevier Patient Education  2020 [employer].

## 2020-05-23 NOTE — Assessment & Plan Note (Signed)
Pancreatic mass: New per CT yesterday, he seems to be asymptomatic, possibly a incidental finding. Discussed with GI: Proceed with MRI and they will follow up with the patient Discussed with radiology: Creatinine clearance is 51, okay to proceed with MRI of the abdomen, will order an MRI at with and without contrast using Gadavist. Will let patient know on

## 2020-05-24 ENCOUNTER — Other Ambulatory Visit: Payer: Self-pay | Admitting: Internal Medicine

## 2020-05-25 ENCOUNTER — Telehealth: Payer: Self-pay

## 2020-05-25 ENCOUNTER — Ambulatory Visit
Admission: RE | Admit: 2020-05-25 | Discharge: 2020-05-25 | Disposition: A | Discharge status: Home / Self Care | Payer: Medicare Other | Source: Ambulatory Visit | Attending: Internal Medicine | Admitting: Internal Medicine

## 2020-05-25 ENCOUNTER — Other Ambulatory Visit: Payer: Self-pay

## 2020-05-25 MED ORDER — GADOBUTROL 1 MMOL/ML IV SOLN
7.0000 mL | Freq: Once | INTRAVENOUS | Status: AC | PRN
  Administered 2020-05-25: 7 mL via INTRAVENOUS

## 2020-05-25 NOTE — Telephone Encounter (Signed)
-----   Message from Irene Shipper, MD sent at 05/22/2020 11:46 AM EDT ----- Regarding: RE: Pancreatic mass [name],Good morning.  Thank you for the notification.  If you would, please order MRI of the pancreas and have him follow-up in my office thereafter [name] please assist).Ethan Mcneil ----- Message ----- From: Colon Branch, MD Sent: 05/22/2020  11:23 AM EDT To: Irene Shipper, MD, Colon Branch, MD Subject: Pancreatic mass                                [name],   Good morning, our mutual patient went to the ER [facility]) with left-sided abdominal & shoulder pain, the CT showed a new pancreatic mass.  I think this is an incidental finding. Please advise what is the best next step, if you like to  see him or perhaps see him after a MRI. Thank you and I hope you are well [facility]

## 2020-05-25 NOTE — Telephone Encounter (Signed)
Pt scheduled to see Dr. Henrene Pastor 05/30/20@3 :40pm. Left message for pt regarding appt on home and cell phone.

## 2020-05-26 NOTE — Telephone Encounter (Signed)
Pt aware.

## 2020-05-29 ENCOUNTER — Other Ambulatory Visit: Payer: Self-pay | Admitting: Internal Medicine

## 2020-05-30 ENCOUNTER — Encounter: Payer: Self-pay | Admitting: Internal Medicine

## 2020-05-30 ENCOUNTER — Ambulatory Visit (INDEPENDENT_AMBULATORY_CARE_PROVIDER_SITE_OTHER): Payer: Medicare Other | Admitting: Internal Medicine

## 2020-05-30 VITALS — BP 130/60 | HR 60 | Ht 70.0 in | Wt 145.0 lb

## 2020-05-30 DIAGNOSIS — D508 Other iron deficiency anemias: Secondary | ICD-10-CM | POA: Diagnosis not present

## 2020-05-30 DIAGNOSIS — K8689 Other specified diseases of pancreas: Secondary | ICD-10-CM

## 2020-05-30 DIAGNOSIS — R935 Abnormal findings on diagnostic imaging of other abdominal regions, including retroperitoneum: Secondary | ICD-10-CM

## 2020-05-30 NOTE — Patient Instructions (Signed)
Dr. Henrene Pastor will get with Dr. Rush Landmark about setting up an EUS

## 2020-05-30 NOTE — Progress Notes (Signed)
HISTORY OF PRESENT ILLNESS:  Ethan Mcneil is a 86 y.o. male, retired [employer], with multiple medical problems who has been evaluated in this office previously for pancreatic insufficiency for which she is on enzyme replacement therapy.  Sent today by his primary care provider regarding abnormal imaging of the pancreas.  Briefly, the patient was evaluated in December 2017 guarding weight loss and stearrhea.  CT and MRI imaging at that time revealed pancreatic ductal dilation and pancreatic atrophy without mass.  He is diabetic.  He was treated with enzyme supplementation and responded nicely.  As part of his work-up for weight loss he also underwent colonoscopy and upper endoscopy February 2018.  Colonoscopy was normal.  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  Patient has not been seen since his follow-up March 2018.  He is accompanied today by his son.  Seen at outside emergency room for discomfort under the left ribs and left shoulder after eating.  Imaging at that time revealed a 3.1 cm cystic and soft tissue pancreatic mass.  Subsequent MRI was performed May 22, 2020 and revealed: MPRESSION: 1. There is a nonenhancing, lobulated cystic lesion of the pancreatic duct measuring 3.6 x 3.3 x 2.2 cm which is new when compared to MR examination dated 06/05/2017. Dilatation of the main pancreatic duct is a high risk feature in the setting of a cystic pancreatic neoplasm and FNA/EUS should be considered. If surveillance imaging is elected, recommend initial follow-up imaging by multiphasic contrast enhanced MRI in 2 years. This recommendation follows ACR consensus guidelines: Management of Incidental Pancreatic Cysts: A White Paper of the ACR Incidental Findings Committee. [place] 0175;10:258-527. 2. Pancreatic divisum. 3. Numerous tiny fluid signal cysts throughout the liver parenchyma, likely cysts and similar to prior  examination.  Patient has had no further problems with left-sided pain.  His weight has been stable.  They are concerned about the significance of imaging abnormality.  Review of outside laboratories from Apr 21, 2020 shows hemoglobin 0.4 with MCV 92.1.  Iron saturation 20% and ferritin 23 for which she was placed on iron.  REVIEW OF SYSTEMS:  All non-GI ROS negative unless otherwise stated in the HPI except for fatigue, hard of hearing  Past Medical History:  Diagnosis Date  . Arthritis   . CAD (coronary artery disease)    CABG 1997; grafts patent @ cath [facility] 11/09  . Chronic kidney disease   . DM2 (diabetes mellitus, type 2) ([place])   . Gout    after tick bite  . HLD (hyperlipidemia)   . HTN (hypertension)    essential nos  . Hypothyroid   . Postsurgical aortocoronary bypass status   . PVD (peripheral vascular disease) ([place])   . S/P herniorrhaphy   . Tick fever 2012   [facility] , [place]  . Unspecified hyperplasia of prostate without urinary obstruction and other lower urinary tract symptoms (LUTS)   . Urolithiasis 2015   hx of    Past Surgical History:  Procedure Laterality Date  . CORONARY ARTERY BYPASS GRAFT     x7 vessels 1997; cath 2002 and 2009; grafts patent   . CYSTOSCOPY WITH RETROGRADE PYELOGRAM, URETEROSCOPY AND STENT PLACEMENT Right 02/21/2014   Procedure: CYSTOSCOPY WITH RIGHT  RETROGRADE PYELOGRAM,RIGHT  URETEROSCOPY WITH STONE BASKETING EXTRACTION;  Surgeon: Irine Seal, MD;  Location: [facility];  Service: Urology;  Laterality: Right;  . INGUINAL HERNIA REPAIR    . SHOULDER SURGERY  Social History Ethan Mcneil  reports that he has never smoked. He has never used smokeless tobacco. He reports that he does not drink alcohol and does not use drugs.  family history includes Coronary artery disease in his mother; Diabetes in his brother; Lung cancer in his father; Prostate cancer in his maternal uncle; Stroke in his son; Throat cancer in his  son.  Allergies  Allergen Reactions  . Codeine Nausea And Vomiting       PHYSICAL EXAMINATION: Vital signs: There were no vitals taken for this visit.  Constitutional: generally well-appearing, no acute distress Psychiatric: alert and oriented x3, cooperative Eyes: extraocular movements intact, anicteric, conjunctiva pink Mouth: oral pharynx moist, no lesions Neck: supple no lymphadenopathy Cardiovascular: heart regular rate and rhythm, no murmur Lungs: clear to auscultation bilaterally Abdomen: soft, nontender, nondistended, no obvious ascites, no peritoneal signs, normal bowel sounds, no organomegaly Rectal: Omitted Extremities: no clubbing or cyanosis.  1+ lower extremity edema bilaterally Skin: no lesions on visible extremities Neuro: No focal deficits.  Cranial nerves intact.  Bilateral hearing aids  ASSESSMENT:  1.  Interval development of 3.6 cm cystic lesion pancreas associated with chronic pancreatic atrophy and chronic pancreatic ductal dilation.  Etiologies include benign nonprecancerous, benign precancerous, cancerous lesions. 2.  History of pancreatic insufficiency on pancreatic enzyme replacement 3.  History of diabetes mellitus.  On insulin 4.  Normocytic anemia with borderline iron deficiency.  On iron replacement 5.  Normal colonoscopy February 2018 6.  Upper endoscopy February 2018 with nodular mucosa in the proximal stomach with lymphoplasmacytic infiltrates NOT favoring lymphoproliferative process.   PLAN:  1.  Patient should be set up for ultrasound with FNA for further assessment of the pancreatic lesion.  I have discussed this case, this afternoon, with my partner, Dr. Rush Landmark is kindly agreed to arrange for outpatient EUS.  I did discuss nature of the procedure with the patient and her son.  Questions answered to their satisfaction.

## 2020-05-31 ENCOUNTER — Telehealth: Payer: Self-pay

## 2020-05-31 NOTE — Telephone Encounter (Signed)
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 Henrene Pastor and I know once the patient's procedure has been scheduled.FYI DJ. Thanks.GM ----- Message ----- From: Irene Shipper, MD Sent: 05/30/2020   4:18 PM EDT To: Colon Branch, MD, Irving Copas., MD  Gabe,Thank you for arranging EUS and Mr. Rosana Berger.[name]

## 2020-06-02 ENCOUNTER — Other Ambulatory Visit: Payer: Self-pay

## 2020-06-02 DIAGNOSIS — R935 Abnormal findings on diagnostic imaging of other abdominal regions, including retroperitoneum: Secondary | ICD-10-CM

## 2020-06-02 DIAGNOSIS — K8689 Other specified diseases of pancreas: Secondary | ICD-10-CM

## 2020-06-02 NOTE — Telephone Encounter (Signed)
EUS EGD scheduled for 08/07/20 at 830 am at [facility] with Dr Rush Landmark COVID test on 08/03/20 at 1010 am

## 2020-06-05 NOTE — Telephone Encounter (Signed)
EGD EMR  scheduled, pt instructed and medications reviewed.  Patient instructions mailed to home.  Patient to call with any questions or concerns.  

## 2020-06-05 NOTE — Telephone Encounter (Signed)
Left message on machine to call back  

## 2020-06-05 NOTE — Telephone Encounter (Signed)
Patient is returning your call.  

## 2020-06-08 ENCOUNTER — Other Ambulatory Visit (INDEPENDENT_AMBULATORY_CARE_PROVIDER_SITE_OTHER): Payer: Medicare Other

## 2020-06-08 ENCOUNTER — Other Ambulatory Visit: Payer: Self-pay

## 2020-06-08 DIAGNOSIS — E038 Other specified hypothyroidism: Secondary | ICD-10-CM

## 2020-06-08 LAB — TSH: TSH: 3.17 u[IU]/mL (ref 0.35–4.50)

## 2020-06-09 MED ORDER — LEVOTHYROXINE SODIUM 100 MCG PO TABS: 100.0000 ug | ORAL_TABLET | Freq: Every day | ORAL | 4 refills | Status: DC

## 2020-06-09 NOTE — Addendum Note (Signed)
Addended byDamita Dunnings D on: 06/09/2020 04:48 PM   Modules accepted: Orders

## 2020-06-11 ENCOUNTER — Other Ambulatory Visit: Payer: Self-pay | Admitting: Internal Medicine

## 2020-06-22 ENCOUNTER — Encounter: Payer: Self-pay | Admitting: Internal Medicine

## 2020-07-04 ENCOUNTER — Ambulatory Visit (INDEPENDENT_AMBULATORY_CARE_PROVIDER_SITE_OTHER): Payer: Medicare Other | Admitting: Endocrinology

## 2020-07-04 ENCOUNTER — Other Ambulatory Visit: Payer: Self-pay

## 2020-07-04 VITALS — BP 138/58 | HR 53 | Ht 70.0 in | Wt 140.6 lb

## 2020-07-04 DIAGNOSIS — E1151 Type 2 diabetes mellitus with diabetic peripheral angiopathy without gangrene: Secondary | ICD-10-CM

## 2020-07-04 DIAGNOSIS — E1165 Type 2 diabetes mellitus with hyperglycemia: Secondary | ICD-10-CM

## 2020-07-04 DIAGNOSIS — IMO0002 Reserved for concepts with insufficient information to code with codable children: Secondary | ICD-10-CM

## 2020-07-04 LAB — POCT GLYCOSYLATED HEMOGLOBIN (HGB A1C): Hemoglobin A1C: 9.4 % — AB (ref 4.0–5.6)

## 2020-07-04 MED ORDER — BASAGLAR KWIKPEN 100 UNIT/ML ~~LOC~~ SOPN: 3.0000 [IU] | PEN_INJECTOR | Freq: Every day | SUBCUTANEOUS | 11 refills | Status: DC

## 2020-07-04 NOTE — Progress Notes (Signed)
Subjective:    Patient ID: Ethan Mcneil, male    DOB: [birth year], 86 y.o.   MRN: [id number]  HPI Pt returns for f/u of diabetes mellitus: DM type: pancreatic insuff. Dx'ed: 0240 Complications: PN, CRI, CAD, and PAD.   Therapy: insulin since 2017 DKA: never Severe hypoglycemia: never Pancreatitis: once (2017) Pancreatic imaging: chronic atrophy with diffuse pancreatic duct dilatation.   SDOH: frail elderly state Other: he takes multiple daily injections; based on the pattern of cbg's, he does not need basal insulin; he declines pump.  Interval history: no cbg record, but states cbg's vary from 94-200's.  He checks almost exclusively fasting.  He says the 94 was at HS.  No recent steroids.   Past Medical History:  Diagnosis Date  . Arthritis   . CAD (coronary artery disease)    CABG 1997; grafts patent @ cath [facility] 11/09  . Chronic kidney disease   . DM2 (diabetes mellitus, type 2) ([place])   . Gout    after tick bite  . HLD (hyperlipidemia)   . HTN (hypertension)    essential nos  . Hypothyroid   . Postsurgical aortocoronary bypass status   . PVD (peripheral vascular disease) ([place])   . S/P herniorrhaphy   . Tick fever 2012   [facility] , [place]  . Unspecified hyperplasia of prostate without urinary obstruction and other lower urinary tract symptoms (LUTS)   . Urolithiasis 2015   hx of    Past Surgical History:  Procedure Laterality Date  . CORONARY ARTERY BYPASS GRAFT     x7 vessels 1997; cath 2002 and 2009; grafts patent   . CYSTOSCOPY WITH RETROGRADE PYELOGRAM, URETEROSCOPY AND STENT PLACEMENT Right 02/21/2014   Procedure: CYSTOSCOPY WITH RIGHT  RETROGRADE PYELOGRAM,RIGHT  URETEROSCOPY WITH STONE BASKETING EXTRACTION;  Surgeon: Irine Seal, MD;  Location: [facility];  Service: Urology;  Laterality: Right;  . INGUINAL HERNIA REPAIR    . SHOULDER SURGERY      Social History   Socioeconomic History  . Marital status: Married    Spouse name: [name]  . Number of  children: 4  . Years of education: Not on file  . Highest education level: Not on file  Occupational History  . Occupation: retired, works few hours   Tobacco Use  . Smoking status: Never Smoker  . Smokeless tobacco: Never Used  Substance and Sexual Activity  . Alcohol use: No  . Drug use: No  . Sexual activity: Not Currently  Other Topics Concern  . Not on file  Social History Narrative   Buyer, retail and former race car driver   4 children (boys)   Designated party form signed appointing wife - [name]; ok to leave msg on home phone [phone]         Social Determinants of Health   Financial Resource Strain: Low Risk   . Difficulty of Paying Living Expenses: Not hard at all  Food Insecurity: No Food Insecurity  . Worried About Charity fundraiser in the Last Year: Never true  . Ran Out of Food in the Last Year: Never true  Transportation Needs: No Transportation Needs  . Lack of Transportation (Medical): No  . Lack of Transportation (Non-Medical): No  Physical Activity:   . Days of Exercise per Week:   . Minutes of Exercise per Session:   Stress:   . Feeling of Stress :   Social Connections:   . Frequency of Communication with Friends and Family:   .  Frequency of Social Gatherings with Friends and Family:   . Attends Religious Services:   . Active Member of Clubs or Organizations:   . Attends Archivist Meetings:   Marland Kitchen Marital Status:   Intimate Partner Violence:   . Fear of Current or Ex-Partner:   . Emotionally Abused:   Marland Kitchen Physically Abused:   . Sexually Abused:     Current Outpatient Medications on File Prior to Visit  Medication Sig Dispense Refill  . glucose blood (FREESTYLE LITE) test strip CHECK BLOOD SUGAR NO MORE THAN TWICE DAILY. 200 strip 0  . insulin lispro (HUMALOG KWIKPEN) 100 UNIT/ML KwikPen 3 times a day (just before each meal), 06-20-10 units, and pen needles 3/day 15 mL 9  . Insulin Pen Needle (B-D UF III MINI PEN NEEDLES) 31G  X 5 MM MISC 1 each by Other route 3 (three) times daily. E11.9 90 each 2  . aspirin EC 81 MG tablet Take 81 mg by mouth daily.    Marland Kitchen augmented betamethasone dipropionate (DIPROLENE-AF) 0.05 % cream     . Cyanocobalamin (B-12) 1000 MCG TABS Take 1,000 mcg by mouth daily.    . ferrous sulfate 325 (65 FE) MG tablet Take 1 tablet (325 mg total) by mouth 2 (two) times daily before a meal. 60 tablet 6  . levothyroxine (SYNTHROID) 100 MCG tablet Take 1 tablet (100 mcg total) by mouth daily before breakfast. 30 tablet 3  . metoprolol succinate (TOPROL-XL) 25 MG 24 hr tablet Take 0.5 tablets (12.5 mg total) by mouth daily. 90 tablet 1  . Pancrelipase, Lip-Prot-Amyl, (ZENPEP) 25000 units CPEP Take 2 with meals and snacks 810 capsule 3  . ramipril (ALTACE) 2.5 MG capsule Take 1 capsule (2.5 mg total) by mouth daily. 90 capsule 1  . simvastatin (ZOCOR) 20 MG tablet Take 1 tablet (20 mg total) by mouth at bedtime. 90 tablet 1  . Vitamin D, Ergocalciferol, (DRISDOL) 1.25 MG (50000 UNIT) CAPS capsule Take 1 capsule (50,000 Units total) by mouth every 7 (seven) days. 12 capsule 0   No current facility-administered medications on file prior to visit.    Allergies  Allergen Reactions  . Codeine Nausea And Vomiting    Family History  Problem Relation Age of Onset  . Lung cancer Father   . Coronary artery disease Mother        stent  . Diabetes Brother   . Prostate cancer Maternal Uncle        in his 3s  . Stroke Son        GF  . Throat cancer Son   . Colon cancer Neg Hx   . Rectal cancer Neg Hx   . Stomach cancer Neg Hx   . Esophageal cancer Neg Hx   . Pancreatic cancer Neg Hx     BP (!) 138/58 (BP Location: Left Arm, Patient Position: Sitting)   Pulse (!) 53   Ht 5\' 10"  (1.778 m)   Wt 140 lb 9.6 oz (63.8 kg)   SpO2 97%   BMI 20.17 kg/m    Review of Systems He denies hypoglycemia    Objective:   Physical Exam VITAL SIGNS:  See vs page GENERAL: no distress Pulses: dorsalis pedis  intact bilat.   MSK: no deformity of the feet CV: no leg edema Skin:  no ulcer on the feet.  normal color and temp on the feet. Neuro: sensation is intact to touch on the feet.    Lab Results  Component Value Date  HGBA1C 9.4 (A) 07/04/2020   Lab Results  Component Value Date   CREATININE 1.59 (H) 03/03/2020   BUN 17 03/03/2020   NA 134 (L) 03/03/2020   K 4.7 03/03/2020   CL 105 03/03/2020   CO2 26 03/03/2020       Assessment & Plan:  DM due to panc insuff, with PAD: he needs increased rx.   Noncompliance with cbg recording: we may need to add HS insulin, but he does not have cbg info to be certain.  We'll add low dosage.    Patient Instructions  Your blood pressure is high today.  Please see your primary care provider soon, to have it rechecked check your blood sugar 4 times a day.  vary the time of day when you check, between before the 3 meals, and at bedtime.  also check if you have symptoms of your blood sugar being too high or too low.  please keep a record of the readings and bring it to your next appointment here (or you can bring the meter itself).  You can write it on any piece of paper.  please call us sooner if your blood sugar goes below 70, or if you have a lot of readings over 200.   I have sent a prescription to your pharmacy, to add "basaglar," at bedtime. Please continue the same humalog. Please come back for a follow-up appointment in 2 months.

## 2020-07-04 NOTE — Patient Instructions (Addendum)
Your blood pressure is high today.  Please see your primary care provider soon, to have it rechecked check your blood sugar 4 times a day.  vary the time of day when you check, between before the 3 meals, and at bedtime.  also check if you have symptoms of your blood sugar being too high or too low.  please keep a record of the readings and bring it to your next appointment here (or you can bring the meter itself).  You can write it on any piece of paper.  please call us sooner if your blood sugar goes below 70, or if you have a lot of readings over 200.   I have sent a prescription to your pharmacy, to add "basaglar," at bedtime. Please continue the same humalog. Please come back for a follow-up appointment in 2 months.

## 2020-07-07 DIAGNOSIS — I129 Hypertensive chronic kidney disease with stage 1 through stage 4 chronic kidney disease, or unspecified chronic kidney disease: Secondary | ICD-10-CM | POA: Diagnosis not present

## 2020-07-07 DIAGNOSIS — N1832 Chronic kidney disease, stage 3b: Secondary | ICD-10-CM | POA: Diagnosis not present

## 2020-07-07 DIAGNOSIS — M109 Gout, unspecified: Secondary | ICD-10-CM | POA: Diagnosis not present

## 2020-07-07 DIAGNOSIS — N189 Chronic kidney disease, unspecified: Secondary | ICD-10-CM | POA: Diagnosis not present

## 2020-07-07 DIAGNOSIS — D631 Anemia in chronic kidney disease: Secondary | ICD-10-CM | POA: Diagnosis not present

## 2020-07-07 DIAGNOSIS — E559 Vitamin D deficiency, unspecified: Secondary | ICD-10-CM | POA: Diagnosis not present

## 2020-07-07 LAB — CBC AND DIFFERENTIAL
HCT: 34 — AB (ref 41–53)
Hemoglobin: 11.2 — AB (ref 13.5–17.5)
Neutrophils Absolute: 3
Platelets: 204 (ref 150–399)
WBC: 6

## 2020-07-07 LAB — BASIC METABOLIC PANEL
BUN: 15 (ref 4–21)
CO2: 25 — AB (ref 13–22)
Chloride: 103 (ref 99–108)
Creatinine: 1.5 — AB (ref 0.6–1.3)
Glucose: 167
Potassium: 4.6 (ref 3.4–5.3)
Sodium: 136 — AB (ref 137–147)

## 2020-07-07 LAB — COMPREHENSIVE METABOLIC PANEL
Albumin: 3.6 (ref 3.5–5.0)
Calcium: 8.7 (ref 8.7–10.7)
GFR calc Af Amer: 48
GFR calc non Af Amer: 42
Globulin: 2.4

## 2020-07-07 LAB — IRON,TIBC AND FERRITIN PANEL
%SAT: 12
Ferritin: 64
Iron: 25
TIBC: 208
UIBC: 183

## 2020-07-07 LAB — CBC: RBC: 3.73 — AB (ref 3.87–5.11)

## 2020-07-07 LAB — VITAMIN D 25 HYDROXY (VIT D DEFICIENCY, FRACTURES): Vit D, 25-Hydroxy: 24.8

## 2020-07-23 ENCOUNTER — Other Ambulatory Visit: Payer: Self-pay

## 2020-07-23 ENCOUNTER — Emergency Department (HOSPITAL_COMMUNITY)
Admission: EM | Admit: 2020-07-23 | Discharge: 2020-07-24 | Disposition: A | Discharge status: Home / Self Care | Payer: Medicare Other | Attending: Emergency Medicine | Admitting: Emergency Medicine

## 2020-07-23 ENCOUNTER — Encounter (HOSPITAL_COMMUNITY): Payer: Self-pay | Admitting: Emergency Medicine

## 2020-07-23 DIAGNOSIS — Z5321 Procedure and treatment not carried out due to patient leaving prior to being seen by health care provider: Secondary | ICD-10-CM | POA: Insufficient documentation

## 2020-07-23 DIAGNOSIS — R110 Nausea: Secondary | ICD-10-CM | POA: Diagnosis not present

## 2020-07-23 DIAGNOSIS — R109 Unspecified abdominal pain: Secondary | ICD-10-CM | POA: Insufficient documentation

## 2020-07-23 LAB — COMPREHENSIVE METABOLIC PANEL
ALT: 11 U/L (ref 0–44)
AST: 12 U/L — ABNORMAL LOW (ref 15–41)
Albumin: 2.9 g/dL — ABNORMAL LOW (ref 3.5–5.0)
Alkaline Phosphatase: 74 U/L (ref 38–126)
Anion gap: 9 (ref 5–15)
BUN: 19 mg/dL (ref 8–23)
CO2: 27 mmol/L (ref 22–32)
Calcium: 9 mg/dL (ref 8.9–10.3)
Chloride: 95 mmol/L — ABNORMAL LOW (ref 98–111)
Creatinine, Ser: 1.47 mg/dL — ABNORMAL HIGH (ref 0.61–1.24)
GFR calc Af Amer: 50 mL/min — ABNORMAL LOW (ref >60)
GFR calc non Af Amer: 43 mL/min — ABNORMAL LOW (ref >60)
Glucose, Bld: 233 mg/dL — ABNORMAL HIGH (ref 70–99)
Potassium: 5 mmol/L (ref 3.5–5.1)
Sodium: 131 mmol/L — ABNORMAL LOW (ref 135–145)
Total Bilirubin: 0.8 mg/dL (ref 0.3–1.2)
Total Protein: 6.5 g/dL (ref 6.5–8.1)

## 2020-07-23 LAB — CBC
HCT: 38 % — ABNORMAL LOW (ref 39.0–52.0)
Hemoglobin: 12.4 g/dL — ABNORMAL LOW (ref 13.0–17.0)
MCH: 30 pg (ref 26.0–34.0)
MCHC: 32.6 g/dL (ref 30.0–36.0)
MCV: 91.8 fL (ref 80.0–100.0)
Platelets: 330 10*3/uL (ref 150–400)
RBC: 4.14 MIL/uL — ABNORMAL LOW (ref 4.22–5.81)
RDW: 13 % (ref 11.5–15.5)
WBC: 12.2 10*3/uL — ABNORMAL HIGH (ref 4.0–10.5)
nRBC: 0 % (ref 0.0–0.2)

## 2020-07-23 LAB — LIPASE, BLOOD: Lipase: 16 U/L (ref 11–51)

## 2020-07-23 MED ORDER — SODIUM CHLORIDE 0.9% FLUSH: 3.0000 mL | Freq: Once | INTRAVENOUS | Status: DC

## 2020-07-23 NOTE — ED Triage Notes (Signed)
Patient reports mid/left abdominal pain with nausea onset this week , denies emesis or diarrhea , no fever or chills . Last BM Wednesday last week .

## 2020-07-23 NOTE — ED Notes (Signed)
Please call family when patient is moved to room, Erika Hussar [phone].

## 2020-07-24 ENCOUNTER — Telehealth: Payer: Self-pay | Admitting: Internal Medicine

## 2020-07-24 NOTE — ED Notes (Signed)
Pt LWBS per registration 

## 2020-07-24 NOTE — Telephone Encounter (Signed)
Caller: Jenny Call Back # [phone]  Would like to speak with in regards to her father in law stomach pain.

## 2020-07-24 NOTE — Telephone Encounter (Signed)
Pt states he his is having lower abd pain and he has not had a BM in 5-6 days. Discussed with pt that he can do a miralax purge 7 caps of miralax with 32 oz of gatorade drink 1 cup every 15 min until gone. Pt will try this and call back if he needs anything further.

## 2020-07-25 NOTE — Telephone Encounter (Signed)
Patient stated that he feels much better.  He spoke with GI office yesterday.

## 2020-08-03 ENCOUNTER — Other Ambulatory Visit (HOSPITAL_COMMUNITY)
Admission: RE | Admit: 2020-08-03 | Discharge: 2020-08-03 | Disposition: A | Discharge status: Home / Self Care | Payer: Medicare Other | Source: Ambulatory Visit | Attending: Gastroenterology | Admitting: Gastroenterology

## 2020-08-03 ENCOUNTER — Encounter: Payer: Self-pay | Admitting: Internal Medicine

## 2020-08-03 DIAGNOSIS — Z20822 Contact with and (suspected) exposure to covid-19: Secondary | ICD-10-CM | POA: Insufficient documentation

## 2020-08-03 DIAGNOSIS — Z01812 Encounter for preprocedural laboratory examination: Secondary | ICD-10-CM | POA: Diagnosis not present

## 2020-08-03 LAB — SARS CORONAVIRUS 2 (TAT 6-24 HRS): SARS Coronavirus 2: NEGATIVE

## 2020-08-04 ENCOUNTER — Other Ambulatory Visit: Payer: Self-pay

## 2020-08-04 ENCOUNTER — Telehealth: Payer: Self-pay

## 2020-08-04 ENCOUNTER — Encounter (HOSPITAL_COMMUNITY): Payer: Self-pay | Admitting: Gastroenterology

## 2020-08-04 NOTE — Telephone Encounter (Signed)
Spoke w/ Pt- informed of recommendations. Pt verbalized understanding.  

## 2020-08-04 NOTE — Telephone Encounter (Signed)
He has a history of CAD but no recent events, I feel it is prudent to stop aspirin and restart few days later.  May be best for the ask GI   when exactly he can go back.

## 2020-08-04 NOTE — Progress Notes (Signed)
Pt denies having SOB and chest pain. Pt stated that he is under the care of Dr. Johnsie Cancel, Cardiology and Dr. Larose Kells, PCP. Pt stated that all cardiac studies were performed > 10 years ago ( stress test, echo and cardiac cath) . Pt denies recent labs. Pt stated that he will call MD for pre-op instructions regarding Aspirin. Pt made aware to stop taking vitamins, fish oil and herbal medications. Do not take any NSAIDs ie: Ibuprofen, Advil, Naproxen (Aleve), Motrin, BC and Goody Powder. Pt made aware to take 50% (1unit) of Basaglar insulin the night before surgery. Pt made aware to take no Humalog (Lispro) insulin the DOS ( takes with meals). Pt made aware to check CBG every 2 hours prior to arrival to hospital on DOS. Pt made aware to treat a CBG < 70 with  4 ounces of apple juice, wait 15 minutes after intervention to recheck CBG, if CBG remains < 70, call the Endoscopy unit to speak with a nurse. Pt reminded to quarantine. Pt verbalized understanding of all pre-op instructions.

## 2020-08-04 NOTE — Telephone Encounter (Signed)
Pt has an outpt procedure on Monday and was advised to check with his PCP to see if he needs to discontinue the baby aspirin prior to procedure (biopsy on pancreas).  Please advise.

## 2020-08-04 NOTE — Telephone Encounter (Signed)
Please advise 

## 2020-08-04 NOTE — Anesthesia Preprocedure Evaluation (Addendum)
Anesthesia Evaluation  Patient identified by MRN, date of birth, ID band  Reviewed: Allergy & Precautions, NPO status , Patient's Chart, lab work & pertinent test results  Airway Mallampati: I  TM Distance: >3 FB Neck ROM: Full    Dental  (+) Lower Dentures   Pulmonary neg pulmonary ROS,    Pulmonary exam normal breath sounds clear to auscultation       Cardiovascular hypertension, + CAD and + Peripheral Vascular Disease  Normal cardiovascular exam Rhythm:Regular Rate:Normal  Hx of remote CABG 1997 with patent grafts by cath 2009. Last seen by Dr. Johnsie Cancel 10/29/2010 and per note "He has some SSCP in 10/2008 and cath showed widely patent grafts that looked quite pristine for there age." Pt has subsequently been followed by PCP Dr. Kathlene November for risk factor management.   07/23/20: NSR. Rate 67.  LHC 10/24/2008:  CONCLUSION:  1. Coronary artery status post coronary bypass graft surgery in 1997.  2. 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  3. Patent sequential vein graft to the marginal and posterior      descending branches of the right coronary artery, patent sequential      vein graft to the marginal and posterolateral branch of the      circumflex artery, patent vein graft to the diagonal branch of the      LAD, and patent LIMA graft to the LAD.  4. Normal LV function.   RECOMMENDATIONS:  All six grafts are patent and they all look well with  only minimal luminal irregularities despite the fact that they are 12  years out since bypass surgery.  The only potential source of ischemia  was the first marginal branch which was not grafted.  I will try and  look at the old films and see if this is changed.  We will plan  reassurance and continued medical therapy.    Neuro/Psych negative neurological ROS  negative psych ROS   GI/Hepatic Pancreatic lesion   Endo/Other   diabetes, Poorly Controlled, Type 2, Insulin DependentHypothyroidism Poorly controlled IDDMII with last A1c 9.4 07/04/20.  Renal/GU Renal InsufficiencyRenal disease     Musculoskeletal  (+) Arthritis ,   Abdominal   Peds  Hematology  (+) anemia ,   Anesthesia Other Findings   Reproductive/Obstetrics                           Anesthesia Physical Anesthesia Plan  ASA: III  Anesthesia Plan: MAC   Post-op Pain Management:    Induction: Intravenous  PONV Risk Score and Plan: TIVA, Treatment may vary due to age or medical condition and Propofol infusion  Airway Management Planned: Natural Airway, Simple Face Mask and Nasal Cannula  Additional Equipment:   Intra-op Plan:   Post-operative Plan:   Informed Consent: I have reviewed the patients History and Physical, chart, labs and discussed the procedure including the risks, benefits and alternatives for the proposed anesthesia with the patient or authorized representative who has indicated his/her understanding and acceptance.     Dental advisory given  Plan Discussed with:   Anesthesia Plan Comments: (  )       Anesthesia Quick Evaluation

## 2020-08-04 NOTE — Progress Notes (Signed)
Anesthesia Chart Review: Same day workup  Hx of remote CABG 1997 with patent grafts by cath 2009. Last seen by Dr. Johnsie Cancel 10/29/2010 and per note "He has some SSCP in 10/2008 and cath showed widely patent grafts that looked quite pristine for there age." Pt has subsequently been followed by PCP Dr. Kathlene November for risk factor management.  Poorly controlled IDDMII followed by endocrinology with last A1c 9.4 07/04/20.  Will need DOS labs and eval.   EKG 07/23/20: NSR. Rate 67.  LHC 10/24/2008:  CONCLUSION:  1. Coronary artery status post coronary bypass graft surgery in 1997.  2. 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  3. Patent sequential vein graft to the marginal and posterior      descending branches of the right coronary artery, patent sequential      vein graft to the marginal and posterolateral branch of the      circumflex artery, patent vein graft to the diagonal branch of the      LAD, and patent LIMA graft to the LAD.  4. Normal LV function.   RECOMMENDATIONS:  All six grafts are patent and they all look well with  only minimal luminal irregularities despite the fact that they are 12  years out since bypass surgery.  The only potential source of ischemia  was the first marginal branch which was not grafted.  I will try and  look at the old films and see if this is changed.  We will plan  reassurance and continued medical therapy.   Wynonia Musty [facility]/Anesthesiology Phone [phone] 08/04/2020 10:58 AM

## 2020-08-04 NOTE — Progress Notes (Signed)
PA, Anesthesiology, asked to review pt history; see note in Epic.

## 2020-08-07 ENCOUNTER — Encounter (HOSPITAL_COMMUNITY)
Admission: RE | Disposition: A | Discharge status: Home / Self Care | Payer: Self-pay | Source: Home / Self Care | Attending: Gastroenterology

## 2020-08-07 ENCOUNTER — Ambulatory Visit (HOSPITAL_COMMUNITY)
Admission: RE | Admit: 2020-08-07 | Discharge: 2020-08-07 | Disposition: A | Discharge status: Home / Self Care | Payer: Medicare Other | Attending: Gastroenterology | Admitting: Gastroenterology

## 2020-08-07 ENCOUNTER — Encounter (HOSPITAL_COMMUNITY): Payer: Self-pay | Admitting: Gastroenterology

## 2020-08-07 ENCOUNTER — Ambulatory Visit (HOSPITAL_COMMUNITY): Payer: Medicare Other | Admitting: Physician Assistant

## 2020-08-07 ENCOUNTER — Other Ambulatory Visit: Payer: Self-pay

## 2020-08-07 DIAGNOSIS — D72820 Lymphocytosis (symptomatic): Secondary | ICD-10-CM | POA: Diagnosis not present

## 2020-08-07 DIAGNOSIS — K222 Esophageal obstruction: Secondary | ICD-10-CM | POA: Diagnosis not present

## 2020-08-07 DIAGNOSIS — R8569 Abnormal cytological findings in specimens from other digestive organs and abdominal cavity: Secondary | ICD-10-CM | POA: Diagnosis not present

## 2020-08-07 DIAGNOSIS — K269 Duodenal ulcer, unspecified as acute or chronic, without hemorrhage or perforation: Secondary | ICD-10-CM | POA: Insufficient documentation

## 2020-08-07 DIAGNOSIS — K449 Diaphragmatic hernia without obstruction or gangrene: Secondary | ICD-10-CM | POA: Diagnosis not present

## 2020-08-07 DIAGNOSIS — K2950 Unspecified chronic gastritis without bleeding: Secondary | ICD-10-CM | POA: Diagnosis not present

## 2020-08-07 DIAGNOSIS — E1122 Type 2 diabetes mellitus with diabetic chronic kidney disease: Secondary | ICD-10-CM | POA: Diagnosis not present

## 2020-08-07 DIAGNOSIS — R896 Abnormal cytological findings in specimens from other organs, systems and tissues: Secondary | ICD-10-CM | POA: Diagnosis not present

## 2020-08-07 DIAGNOSIS — N189 Chronic kidney disease, unspecified: Secondary | ICD-10-CM | POA: Insufficient documentation

## 2020-08-07 DIAGNOSIS — K862 Cyst of pancreas: Secondary | ICD-10-CM | POA: Diagnosis not present

## 2020-08-07 DIAGNOSIS — Z951 Presence of aortocoronary bypass graft: Secondary | ICD-10-CM | POA: Diagnosis not present

## 2020-08-07 DIAGNOSIS — Z794 Long term (current) use of insulin: Secondary | ICD-10-CM | POA: Diagnosis not present

## 2020-08-07 DIAGNOSIS — K3189 Other diseases of stomach and duodenum: Secondary | ICD-10-CM

## 2020-08-07 DIAGNOSIS — K7689 Other specified diseases of liver: Secondary | ICD-10-CM | POA: Diagnosis not present

## 2020-08-07 DIAGNOSIS — R935 Abnormal findings on diagnostic imaging of other abdominal regions, including retroperitoneum: Secondary | ICD-10-CM

## 2020-08-07 DIAGNOSIS — K2980 Duodenitis without bleeding: Secondary | ICD-10-CM | POA: Diagnosis not present

## 2020-08-07 DIAGNOSIS — E1151 Type 2 diabetes mellitus with diabetic peripheral angiopathy without gangrene: Secondary | ICD-10-CM | POA: Diagnosis not present

## 2020-08-07 DIAGNOSIS — Q453 Other congenital malformations of pancreas and pancreatic duct: Secondary | ICD-10-CM | POA: Insufficient documentation

## 2020-08-07 DIAGNOSIS — I2510 Atherosclerotic heart disease of native coronary artery without angina pectoris: Secondary | ICD-10-CM | POA: Diagnosis not present

## 2020-08-07 DIAGNOSIS — K8689 Other specified diseases of pancreas: Secondary | ICD-10-CM | POA: Diagnosis not present

## 2020-08-07 DIAGNOSIS — E1165 Type 2 diabetes mellitus with hyperglycemia: Secondary | ICD-10-CM | POA: Insufficient documentation

## 2020-08-07 HISTORY — DX: Presence of spectacles and contact lenses: Z97.3

## 2020-08-07 HISTORY — DX: Disease of pancreas, unspecified: K86.9

## 2020-08-07 HISTORY — DX: Presence of dental prosthetic device (complete) (partial): Z97.2

## 2020-08-07 LAB — GLUCOSE, CAPILLARY
Glucose-Capillary: 133 mg/dL — ABNORMAL HIGH (ref 70–99)
Glucose-Capillary: 168 mg/dL — ABNORMAL HIGH (ref 70–99)

## 2020-08-07 SURGERY — ESOPHAGOGASTRODUODENOSCOPY (EGD) WITH PROPOFOL
Anesthesia: Monitor Anesthesia Care

## 2020-08-07 MED ORDER — CIPROFLOXACIN IN D5W 400 MG/200ML IV SOLN
INTRAVENOUS | Status: DC | PRN
  Administered 2020-08-07: 400 mg via INTRAVENOUS

## 2020-08-07 MED ORDER — PROPOFOL 500 MG/50ML IV EMUL
INTRAVENOUS | Status: DC | PRN
  Administered 2020-08-07: 125 ug/kg/min via INTRAVENOUS

## 2020-08-07 MED ORDER — CIPROFLOXACIN HCL 500 MG PO TABS: 500.0000 mg | ORAL_TABLET | Freq: Two times a day (BID) | ORAL | 0 refills | Status: AC

## 2020-08-07 MED ORDER — LACTATED RINGERS IV SOLN: INTRAVENOUS | Status: DC

## 2020-08-07 MED ORDER — GLYCOPYRROLATE 0.2 MG/ML IJ SOLN
INTRAMUSCULAR | Status: DC | PRN
Start: 2020-08-07 — End: 2020-08-07
  Administered 2020-08-07: .1 mg via INTRAVENOUS

## 2020-08-07 MED ORDER — PROPOFOL 10 MG/ML IV BOLUS
INTRAVENOUS | Status: DC | PRN
  Administered 2020-08-07: 20 mg via INTRAVENOUS

## 2020-08-07 MED ORDER — OMEPRAZOLE 40 MG PO CPDR: 40.0000 mg | DELAYED_RELEASE_CAPSULE | Freq: Two times a day (BID) | ORAL | 5 refills | Status: DC

## 2020-08-07 MED ORDER — CIPROFLOXACIN IN D5W 400 MG/200ML IV SOLN
INTRAVENOUS | Status: AC
  Filled 2020-08-07: qty 200

## 2020-08-07 MED ORDER — SODIUM CHLORIDE 0.9 % IV SOLN: INTRAVENOUS | Status: DC

## 2020-08-07 SURGICAL SUPPLY — 14 items

## 2020-08-07 NOTE — H&P (Signed)
GASTROENTEROLOGY PROCEDURE H&P NOTE   Primary Care Physician: Colon Branch, MD  HPI: Ethan Mcneil is a 86 y.o. male who presents for EGD/EUS with possible FNB.  History of PD dilation previously now with progressive dilation and new cystic appearing lesion within the pancreas.  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  Past Medical History:  Diagnosis Date  . Arthritis   . CAD (coronary artery disease)    CABG 1997; grafts patent @ cath [facility] 11/09  . Chronic kidney disease   . DM2 (diabetes mellitus, type 2) ([place])   . Gout    after tick bite  . HLD (hyperlipidemia)   . HTN (hypertension)    essential nos  . Hypothyroid   . Pancreatic lesion   . Postsurgical aortocoronary bypass status   . PVD (peripheral vascular disease) ([place])   . S/P herniorrhaphy   . Tick fever 2012   [facility] [place]  . Unspecified hyperplasia of prostate without urinary obstruction and other lower urinary tract symptoms (LUTS)   . Urolithiasis 2015   hx of  . Wears dentures   . Wears glasses    Past Surgical History:  Procedure Laterality Date  . CORONARY ARTERY BYPASS GRAFT     x7 vessels 1997; cath 2002 and 2009; grafts patent   . CYSTOSCOPY WITH RETROGRADE PYELOGRAM, URETEROSCOPY AND STENT PLACEMENT Right 02/21/2014   Procedure: CYSTOSCOPY WITH RIGHT  RETROGRADE PYELOGRAM,RIGHT  URETEROSCOPY WITH STONE BASKETING EXTRACTION;  Surgeon: Irine Seal, MD;  Location: [facility] ORS;  Service: Urology;  Laterality: Right;  . HERNIA REPAIR    . INGUINAL HERNIA REPAIR    . MULTIPLE TOOTH EXTRACTIONS    . SHOULDER SURGERY     No current facility-administered medications for this encounter.   Allergies  Allergen Reactions  . Codeine Nausea And Vomiting   Family History  Problem Relation Age of Onset  . Lung cancer Father   . Coronary artery disease Mother        stent  . Diabetes Brother   . Prostate cancer Maternal Uncle        in his 81s  . Stroke Son        GF   . Throat cancer Son   . Colon cancer Neg Hx   . Rectal cancer Neg Hx   . Stomach cancer Neg Hx   . Esophageal cancer Neg Hx   . Pancreatic cancer Neg Hx    Social History   Socioeconomic History  . Marital status: Married    Spouse name: [name]  . Number of children: 4  . Years of education: Not on file  . Highest education level: Not on file  Occupational History  . Occupation: retired, works few hours   Tobacco Use  . Smoking status: Never Smoker  . Smokeless tobacco: Never Used  Vaping Use  . Vaping Use: Never used  Substance and Sexual Activity  . Alcohol use: No  . Drug use: No  . Sexual activity: Not Currently  Other Topics Concern  . Not on file  Social History Narrative   Buyer, retail and former race car driver   4 children (boys)   Designated party form signed appointing wife - [name]; ok to leave msg on home phone [phone]         Social Determinants of Health   Financial Resource Strain: Low Risk   . Difficulty of Paying Living Expenses: Not hard at all  Food  Insecurity: No Food Insecurity  . Worried About Charity fundraiser in the Last Year: Never true  . Ran Out of Food in the Last Year: Never true  Transportation Needs: No Transportation Needs  . Lack of Transportation (Medical): No  . Lack of Transportation (Non-Medical): No  Physical Activity:   . Days of Exercise per Week: Not on file  . Minutes of Exercise per Session: Not on file  Stress:   . Feeling of Stress : Not on file  Social Connections:   . Frequency of Communication with Friends and Family: Not on file  . Frequency of Social Gatherings with Friends and Family: Not on file  . Attends Religious Services: Not on file  . Active Member of Clubs or Organizations: Not on file  . Attends Archivist Meetings: Not on file  . Marital Status: Not on file  Intimate Partner Violence:   . Fear of Current or Ex-Partner: Not on file  . Emotionally Abused: Not on file  .  Physically Abused: Not on file  . Sexually Abused: Not on file    Physical Exam: Vital signs in last 24 hours:     GEN: NAD EYE: Sclerae anicteric ENT: MMM CV: Non-tachycardic GI: Soft, NT/ND NEURO:  Alert & Oriented x 3  Lab Results: No results for input(s): WBC, HGB, HCT, PLT in the last 72 hours. BMET No results for input(s): NA, K, CL, CO2, GLUCOSE, BUN, CREATININE, CALCIUM in the last 72 hours. LFT No results for input(s): PROT, ALBUMIN, AST, ALT, ALKPHOS, BILITOT, BILIDIR, IBILI in the last 72 hours. PT/INR No results for input(s): LABPROT, INR in the last 72 hours.   Impression / Plan: This is a 86 y.o.male who presents for EGD/EUS with possible FNB.  History of PD dilation previously now with progressive dilation and new cystic appearing lesion within the pancreas.  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  The risks of an EUS including intestinal perforation, bleeding, infection, aspiration, and medication effects were discussed as was the possibility it may not give a definitive diagnosis if a biopsy is performed.  When a biopsy of the pancreas is done as part of the EUS, there is an additional risk of pancreatitis at the rate of about 1-2%.  It was explained that procedure related pancreatitis is typically mild, although it can be severe and even life threatening, which is why we do not perform random pancreatic biopsies and only biopsy a lesion/area we feel is concerning enough to warrant the risk.  The risks and benefits of endoscopic evaluation were discussed with the patient; these include but are not limited to the risk of perforation, infection, bleeding, missed lesions, lack of diagnosis, severe illness requiring hospitalization, as well as anesthesia and sedation related illnesses.  The patient is agreeable to proceed.    Justice Britain, MD [facility] Advanced Endoscopy Office # [phone]

## 2020-08-07 NOTE — Anesthesia Postprocedure Evaluation (Signed)
Anesthesia Post Note  Patient: Ethan Mcneil  Procedure(s) Performed: ESOPHAGOGASTRODUODENOSCOPY (EGD) WITH PROPOFOL (N/A ) UPPER ENDOSCOPIC ULTRASOUND (EUS) LINEAR (N/A ) FINE NEEDLE ASPIRATION (FNA) LINEAR (N/A ) BIOPSY     Patient location during evaluation: PACU Anesthesia Type: MAC Level of consciousness: awake and alert Pain management: pain level controlled Vital Signs Assessment: post-procedure vital signs reviewed and stable Respiratory status: spontaneous breathing, nonlabored ventilation, respiratory function stable and patient connected to nasal cannula oxygen Cardiovascular status: stable and blood pressure returned to baseline Postop Assessment: no apparent nausea or vomiting Anesthetic complications: no   No complications documented.  Last Vitals:  Vitals:   08/07/20 1110 08/07/20 1124  BP: (!) 114/58 114/78  Pulse: (!) 43 (!) 37  Resp: 13 13  Temp:  (!) 36.3 C  SpO2: 100% 100%    Last Pain:  Vitals:   08/07/20 1110  TempSrc:   PainSc: 0-No pain                 Merlinda Frederick

## 2020-08-07 NOTE — Anesthesia Procedure Notes (Signed)
Procedure Name: MAC Date/Time: 08/07/2020 9:08 AM Performed by: Inda Coke, CRNA Pre-anesthesia Checklist: Patient identified, Emergency Drugs available, Suction available, Timeout performed and Patient being monitored Patient Re-evaluated:Patient Re-evaluated prior to induction Oxygen Delivery Method: Nasal cannula Induction Type: IV induction Dental Injury: Teeth and Oropharynx as per pre-operative assessment

## 2020-08-07 NOTE — Discharge Instructions (Signed)
YOU HAD AN ENDOSCOPIC PROCEDURE TODAY: Refer to the procedure report and other information in the discharge instructions given to you for any specific questions about what was found during the examination. If this information does not answer your questions, please call [place] office at [phone] to clarify.   YOU SHOULD EXPECT: Some feelings of bloating in the abdomen. Passage of more gas than usual. Walking can help get rid of the air that was put into your GI tract during the procedure and reduce the bloating. If you had a lower endoscopy (such as a colonoscopy or flexible sigmoidoscopy) you may notice spotting of blood in your stool or on the toilet paper. Some abdominal soreness may be present for a day or two, also.  DIET: Your first meal following the procedure should be a light meal and then it is ok to progress to your normal diet. A half-sandwich or bowl of soup is an example of a good first meal. Heavy or fried foods are harder to digest and may make you feel nauseous or bloated. Drink plenty of fluids but you should avoid alcoholic beverages for 24 hours. If you had a esophageal dilation, please see attached instructions for diet.    ACTIVITY: Your care partner should take you home directly after the procedure. You should plan to take it easy, moving slowly for the rest of the day. You can resume normal activity the day after the procedure however YOU SHOULD NOT DRIVE, use power tools, machinery or perform tasks that involve climbing or major physical exertion for 24 hours (because of the sedation medicines used during the test).   SYMPTOMS TO REPORT IMMEDIATELY: A gastroenterologist can be reached at any hour. Please call [phone]  for any of the following symptoms:   Following upper endoscopy (EGD, EUS, ERCP, esophageal dilation) Vomiting of blood or coffee ground material  New, significant abdominal pain  New, significant chest pain or pain under the shoulder blades  Painful or  persistently difficult swallowing  New shortness of breath  Black, tarry-looking or red, bloody stools  FOLLOW UP:  If any biopsies were taken you will be contacted by phone or by letter within the next 1-3 weeks. Call [phone]  if you have not heard about the biopsies in 3 weeks.  Please also call with any specific questions about appointments or follow up tests.  

## 2020-08-07 NOTE — Op Note (Addendum)
[facility] Patient Name: Ethan Mcneil Procedure Date : 08/07/2020 MRN: [id number] Attending MD: Justice Britain , MD Date of Birth: Feb 25, 1934 CSN: [id number] Age: 86 Admit Type: Outpatient Procedure:                Upper EUS Indications:              Pancreatic cyst on MRCP, Dilated pancreatic duct on                            MRCP, Long-standing EPI Providers:                Justice Britain, MD, Carlyn Reichert, RN, Janee Morn, Technician, Rejeana Brock, CRNA Referring MD:             Docia Chuck. Henrene Pastor, MD Medicines:                Monitored Anesthesia Care Complications:            No immediate complications. Estimated Blood Loss:     Estimated blood loss was minimal. Procedure:                Pre-Anesthesia Assessment:                           - Prior to the procedure, a History and Physical                            was performed, and patient medications and                            allergies were reviewed. The patient's tolerance of                            previous anesthesia was also reviewed. The risks                            and benefits of the procedure and the sedation                            options and risks were discussed with the patient.                            All questions were answered, and informed consent                            was obtained. Prior Anticoagulants: The patient has                            taken no previous anticoagulant or antiplatelet                            agents except for aspirin. ASA Grade Assessment:  III - A patient with severe systemic disease. After                            reviewing the risks and benefits, the patient was                            deemed in satisfactory condition to undergo the                            procedure.                           After obtaining informed consent, the endoscope was                             passed under direct vision. Throughout the                            procedure, the patient's blood pressure, pulse, and                            oxygen saturations were monitored continuously. The                            [id number]-H190 ([id number]) Olympus gastroscope was                            introduced through the mouth, and advanced to the                            second part of duodenum. The [id number]-[id number] ([id number])                            [place] was introduced through the                            mouth, and advanced to the second part of duodenum.                            The [id number] ([id number]) Olympus Linear EUS scope                            was introduced through the mouth, and advanced to                            the duodenum for ultrasound examination from the                            stomach and duodenum. The upper EUS was                            accomplished without difficulty. The patient  tolerated the procedure. Scope In: Scope Out: Findings:      ENDOSCOPIC FINDING: :      No gross mucosal lesions were noted in the entire esophagus.      A widely patent and non-obstructing Schatzki ring was found at the       gastroesophageal junction (42 cm).      A 1 cm hiatal hernia was present.      A small, submucosal, non-circumferential lesion with no bleeding and no       stigmata of recent bleeding was found on the lesser curvature of the       stomach '@52'  cm - on EUS this was consistent with the pancreas lesion       described below.      Patchy mildly erythematous mucosa without bleeding was found in the       gastric body and in the gastric antrum.      No other gross lesions were noted in the entire examined stomach.       Biopsies were taken with a cold forceps for histology and Helicobacter       pylori testing.      One non-bleeding cratered duodenal ulcer vs fistula was found at the       apex of the duodenal  bulb with evidence of fluid leakage. The lesion was       10 mm in largest dimension. Biopsies were taken with a cold forceps for       histology from the edges. Attempt at closure was not pursued since this       was present initially and may be an evacuating lesion.      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      ENDOSONOGRAPHIC FINDING: :      An irregular mass-like lesion was identified in the pancreatic body/tail       region. The lesion was hypoechoic and cystic appearing. An intramural       nodule in the setting of a MD-IPMN could be possible too, but this was       felt more likely to be parenchymal. The mass measured 27 mm by 15 mm in       maximal cross-sectional diameter. The outer margins were irregular.       There was sonographic evidence suggesting invasion into the superior       mesenteric artery (manifested by abutment). The remainder of the       pancreas was examined. The endosonographic appearance of parenchyma and       the upstream pancreatic duct indicated duct dilation. Therapeutic needle       aspiration for fluid was performed. Color Doppler imaging was utilized       prior to needle puncture to confirm a lack of significant vascular       structures within the needle path. One pass was made with the 22 gauge       needle using a transgastric approach. No stylet was used. The amount of       fluid collected was 3 mL. The fluid was cloudy, serous and viscous.       Samples were sent for amylase concentration, cytology and CEA. We then  proceeded with fine needle biopsy. Color Doppler imaging was utilized       prior to needle puncture to confirm a lack of significant vascular       structures within the needle path. Five passes were made with the 22       gauge ultrasound core biopsy needle  using a transgastric approach.       Visible cores of tissue were obtained. Preliminary cytologic examination       and touch preps were performed. Final cytology results are pending.      Pancreatic parenchymal abnormalities were noted in the entire pancreas.       These consisted of atrophy and hyperechoic strands.      There was no sign of significant endosonographic abnormality in the       common bile duct (4.4 mm) and in the common hepatic duct (5.9 mm).      Endosonographic imaging of the ampulla showed no intramural       (subepithelial) lesion.      Lesions suggestive of a few cysts were identified in the left lobe of       the liver. There was no other associated mass in the visualized liver.      No malignant-appearing lymph nodes were visualized in the celiac region       (level 20), perigastric region, peripancreatic region and porta hepatis       region.      The celiac region was visualized. Impression:               EGD Impression:                           - No gross mucosal lesions in esophagus.                           - Widely patent and non-obstructing Schatzki ring.                           - 1 cm hiatal hernia.                           - Gastric extrinsic impression noted on the lesser                            curvature of the stomach - consistent with area in                            pancreas worked on today.                           - Erythematous mucosa in the gastric body and                            antrum.                           - No other gross lesions in the stomach. Biopsied.                           - Non-bleeding duodenal ulcer vs  fistula with                            drainage noted. Biopsied.                           - Fish-mouth deformity noted at the Minor and Major                            Ampulla (Greater in Minor due to history of                            Pancreatic Divisum).                           EUS Impression:                            - A mixed cystic/solid mass was identified in the                            pancreatic body/tail region. Tissue was obtained                            from this exam, and results are pending. 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 Fine                            needle aspiration for fluid performed. Fine needle                            biopsy performed.                           - Pancreatic parenchymal abnormalities consisting                            of atrophy and hyperechoic strands were noted in                            the entire pancreas.                           - There was no sign of significant pathology in the                            common bile duct and in the common hepatic duct.                           -  A few cystic lesions were found in the left lobe                            of the liver but no other lesions noted.                           - No malignant-appearing lymph nodes were                            visualized in the celiac region (level 20),                            perigastric region, peripancreatic region and porta                            hepatis region. Recommendation:           - The patient will be observed post-procedure,                            until all discharge criteria are met.                           - Discharge patient to home.                           - Patient has a contact number available for                            emergencies. The signs and symptoms of potential                            delayed complications were discussed with the                            patient. Return to normal activities tomorrow.                            Written discharge instructions were provided to the                             patient.                           - Low fat diet for 1 week.                           - Observe patient's clinical course.                           - Start Omeprazole 40 mg twice daily.                           - Ciprofloxacin 500 mg BID twice daily.                           -  May restart Aspirin in 24 hours.                           - Await cytology results, await path results and                            await tumor markers.                           - Recommend an Upper GI series, to better define                            the ulcer v fistula. Repeat cross-sectional imaging                            with a CT or MRI will be considered after Upper GI                            series.                           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 Recommendation after                            results return for consideration of [place]ell Discussion                            and Hepatobiliary referral is likely next steps. We                            will also consider sending CA19-9 blood levels.                           - The findings and recommendations were discussed                            with the patient.                           - The findings and recommendations were discussed                            with the patient's family. Procedure Code(s):        --- Professional ---                           [phone], Esophagogastroduodenoscopy, flexible,  transoral; with transendoscopic ultrasound-guided                            intramural or transmural fine needle                            aspiration/biopsy(s), (includes endoscopic                            ultrasound examination limited to the esophagus,                            stomach or duodenum, and adjacent structures) Diagnosis Code(s):        ---  Professional ---                           K22.2, Esophageal obstruction                           K44.9, Diaphragmatic hernia without obstruction or                            gangrene                           D49.0, Neoplasm of unspecified behavior of                            digestive system                           K31.89, Other diseases of stomach and duodenum                           K26.9, Duodenal ulcer, unspecified as acute or                            chronic, without hemorrhage or perforation                           K86.89, Other specified diseases of pancreas                           K86.9, Disease of pancreas, unspecified                           K76.89, Other specified diseases of liver                           I89.9, Noninfective disorder of lymphatic vessels                            and lymph nodes, unspecified                           K86.2, Cyst of pancreas CPT copyright 2019 [employer]. All rights reserved. The codes documented in this report are preliminary and upon  coder review may  be revised to meet current compliance requirements. Justice Britain, MD 08/07/2020 11:01:50 AM Number of Addenda: 0

## 2020-08-07 NOTE — Transfer of Care (Signed)
Immediate Anesthesia Transfer of Care Note  Patient: ALONTE WULFF  Procedure(s) Performed: ESOPHAGOGASTRODUODENOSCOPY (EGD) WITH PROPOFOL (N/A ) UPPER ENDOSCOPIC ULTRASOUND (EUS) LINEAR (N/A ) FINE NEEDLE ASPIRATION (FNA) LINEAR (N/A ) BIOPSY  Patient Location: PACU  Anesthesia Type:MAC  Level of Consciousness: awake and drowsy  Airway & Oxygen Therapy: Patient Spontanous Breathing and Patient connected to nasal cannula oxygen  Post-op Assessment: Report given to RN and Post -op Vital signs reviewed and stable  Post vital signs: Reviewed and stable  Last Vitals:  Vitals Value Taken Time  BP    Temp    Pulse    Resp    SpO2      Last Pain:  Vitals:   08/07/20 0754  TempSrc: Oral  PainSc: 0-No pain         Complications: No complications documented.

## 2020-08-08 ENCOUNTER — Other Ambulatory Visit: Payer: Self-pay | Admitting: Physician Assistant

## 2020-08-08 LAB — SURGICAL PATHOLOGY

## 2020-08-10 ENCOUNTER — Telehealth: Payer: Self-pay | Admitting: Gastroenterology

## 2020-08-10 ENCOUNTER — Other Ambulatory Visit: Payer: Self-pay | Admitting: Internal Medicine

## 2020-08-10 NOTE — Telephone Encounter (Signed)
Advise patient: Last vitamin D level okay. Recommend OTC vitamin D 2000 units daily

## 2020-08-10 NOTE — Telephone Encounter (Signed)
Pt is experiencing severe itching after he takes CIPRO, pt would like to know if he should stop taking the medicine.

## 2020-08-11 ENCOUNTER — Telehealth: Payer: Self-pay

## 2020-08-11 ENCOUNTER — Other Ambulatory Visit: Payer: Self-pay

## 2020-08-11 ENCOUNTER — Encounter: Payer: Self-pay | Admitting: Gastroenterology

## 2020-08-11 DIAGNOSIS — K316 Fistula of stomach and duodenum: Secondary | ICD-10-CM

## 2020-08-11 NOTE — Telephone Encounter (Signed)
Pt aware. Pt scheduled for upper gi at [facility] 08/18/20@11am , pt to arrive there at 10:45am. Pt to be NPO after midnight. Pt aware of appt  and instructions.

## 2020-08-11 NOTE — Telephone Encounter (Signed)
I called and spoke with patient. He is already had 3 days worth of ciprofloxacin and had no issues. He will stop the ciprofloxacin at this time. Please put ciprofloxacin in his allergy list but put solely as pruritus as the reaction; because he may need it again in the future. Thanks. GM

## 2020-08-11 NOTE — Telephone Encounter (Signed)
-----   Message from Irving Copas., MD sent at 08/10/2020 12:50 PM EDT ----- [name], Interesting case overall. I am still awaiting the cytology from the pathology but I will also be waiting another week or so for the fluid studies to return. I would like to get an upper GI series to evaluate this potential fistula. Patty or covering RN, please let the patient know that I am still waiting for his labs to come back from the actual deep biopsies as well as the fluid studies but I would like to move forward with an upper GI series to better define his anatomy. We may also need to do a CT scan but I would start with an upper GI series. Diagnosis is duodenal fistula. 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 Thanks. GM

## 2020-08-14 NOTE — Telephone Encounter (Signed)
Cirpo has been added to Allergy List.

## 2020-08-15 LAB — CYTOLOGY - NON PAP

## 2020-08-18 ENCOUNTER — Ambulatory Visit (HOSPITAL_COMMUNITY)
Admission: RE | Admit: 2020-08-18 | Discharge: 2020-08-18 | Disposition: A | Discharge status: Home / Self Care | Payer: Medicare Other | Source: Ambulatory Visit | Attending: Gastroenterology | Admitting: Gastroenterology

## 2020-08-18 ENCOUNTER — Other Ambulatory Visit: Payer: Self-pay

## 2020-08-18 ENCOUNTER — Other Ambulatory Visit: Payer: Self-pay | Admitting: Gastroenterology

## 2020-08-18 DIAGNOSIS — K316 Fistula of stomach and duodenum: Secondary | ICD-10-CM

## 2020-08-30 ENCOUNTER — Other Ambulatory Visit: Payer: Self-pay | Admitting: Internal Medicine

## 2020-08-30 ENCOUNTER — Other Ambulatory Visit: Payer: Self-pay

## 2020-08-31 ENCOUNTER — Other Ambulatory Visit: Payer: Self-pay

## 2020-08-31 ENCOUNTER — Encounter: Payer: Self-pay | Admitting: Internal Medicine

## 2020-08-31 ENCOUNTER — Ambulatory Visit (INDEPENDENT_AMBULATORY_CARE_PROVIDER_SITE_OTHER): Payer: Medicare Other | Admitting: Internal Medicine

## 2020-08-31 VITALS — BP 168/58 | HR 43 | Temp 97.7°F | Resp 16 | Ht 70.0 in | Wt 143.4 lb

## 2020-08-31 DIAGNOSIS — K8689 Other specified diseases of pancreas: Secondary | ICD-10-CM

## 2020-08-31 DIAGNOSIS — E038 Other specified hypothyroidism: Secondary | ICD-10-CM

## 2020-08-31 DIAGNOSIS — E538 Deficiency of other specified B group vitamins: Secondary | ICD-10-CM | POA: Diagnosis not present

## 2020-08-31 DIAGNOSIS — I10 Essential (primary) hypertension: Secondary | ICD-10-CM | POA: Diagnosis not present

## 2020-08-31 NOTE — Progress Notes (Signed)
Subjective:    Patient ID: Ethan Mcneil, male    DOB: Jan 05, 1934, 86 y.o.   MRN: [id number]  DOS:  08/31/2020 Type of visit - description: Follow-up Since the last office visit, had a GI work-up.  Chart reviewed. We also talk about vitamin B and D deficiency.   Wt Readings from Last 3 Encounters:  08/31/20 143 lb 6 oz (65 kg)  08/07/20 148 lb (67.1 kg)  07/23/20 143 lb 4.8 oz (65 kg)   BP Readings from Last 3 Encounters:  08/31/20 (!) 168/58  08/07/20 114/78  07/23/20 132/68     Review of Systems Denies fever chills or weight loss. Appetite is good No nausea or vomiting No blood in the stools or abdominal pain  Past Medical History:  Diagnosis Date  . Arthritis   . CAD (coronary artery disease)    CABG 1997; grafts patent @ cath [facility] 11/09  . Chronic kidney disease   . DM2 (diabetes mellitus, type 2) ([place])   . Gout    after tick bite  . HLD (hyperlipidemia)   . HTN (hypertension)    essential nos  . Hypothyroid   . Pancreatic lesion   . Postsurgical aortocoronary bypass status   . PVD (peripheral vascular disease) ([place])   . S/P herniorrhaphy   . Tick fever 2012   [facility] , [place]  . Unspecified hyperplasia of prostate without urinary obstruction and other lower urinary tract symptoms (LUTS)   . Urolithiasis 2015   hx of  . Wears dentures   . Wears glasses     Past Surgical History:  Procedure Laterality Date  . BIOPSY  08/07/2020   Procedure: BIOPSY;  Surgeon: Rush Landmark Telford Nab., MD;  Location: [place];  Service: Gastroenterology;;  . CORONARY ARTERY BYPASS GRAFT     x7 vessels 1997; cath 2002 and 2009; grafts patent   . CYSTOSCOPY WITH RETROGRADE PYELOGRAM, URETEROSCOPY AND STENT PLACEMENT Right 02/21/2014   Procedure: CYSTOSCOPY WITH RIGHT  RETROGRADE PYELOGRAM,RIGHT  URETEROSCOPY WITH STONE BASKETING EXTRACTION;  Surgeon: Irine Seal, MD;  Location: [facility];  Service: Urology;  Laterality: Right;  . ESOPHAGOGASTRODUODENOSCOPY  (EGD) WITH PROPOFOL N/A 08/07/2020   Procedure: ESOPHAGOGASTRODUODENOSCOPY (EGD) WITH PROPOFOL;  Surgeon: Rush Landmark Telford Nab., MD;  Location: [place];  Service: Gastroenterology;  Laterality: N/A;  . EUS N/A 08/07/2020   Procedure: UPPER ENDOSCOPIC ULTRASOUND (EUS) LINEAR;  Surgeon: Irving Copas., MD;  Location: [place];  Service: Gastroenterology;  Laterality: N/A;  . FINE NEEDLE ASPIRATION N/A 08/07/2020   Procedure: FINE NEEDLE ASPIRATION (FNA) LINEAR;  Surgeon: Irving Copas., MD;  Location: [place];  Service: Gastroenterology;  Laterality: N/A;  . HERNIA REPAIR    . INGUINAL HERNIA REPAIR    . MULTIPLE TOOTH EXTRACTIONS    . SHOULDER SURGERY      Allergies as of 08/31/2020      Reactions   Ciprofloxacin Itching   Codeine Nausea And Vomiting      Medication List       Accurate as of August 31, 2020 11:59 PM. If you have any questions, ask your nurse or doctor.        STOP taking these medications   Vitamin D (Ergocalciferol) 1.25 MG (50000 UNIT) Caps capsule Commonly known as: DRISDOL Stopped by: Kathlene November, MD     TAKE these medications   acetaminophen 500 MG tablet Commonly known as: TYLENOL Take 500 mg by mouth every 6 (six) hours as needed for moderate pain or  headache.   aspirin EC 81 MG tablet Take 81 mg by mouth at bedtime.   B-12 1000 MCG Tabs Take 1,000 mcg by mouth daily.   B-D UF III MINI PEN NEEDLES 31G X 5 MM Misc Generic drug: Insulin Pen Needle 1 each by Other route 3 (three) times daily. E11.9   Basaglar KwikPen 100 UNIT/ML Inject 0.03 mLs (3 Units total) into the skin at bedtime.   ferrous sulfate 325 (65 FE) MG tablet Take 1 tablet (325 mg total) by mouth 2 (two) times daily before a meal.   FREESTYLE LITE test strip Generic drug: glucose blood CHECK BLOOD SUGAR NO MORE THAN TWICE DAILY.   insulin lispro 100 UNIT/ML KwikPen Commonly known as: HumaLOG KwikPen 3 times a day (just before each meal), 06-20-10  units, and pen needles 3/day What changed:   how much to take  how to take this  when to take this  additional instructions   levothyroxine 100 MCG tablet Commonly known as: SYNTHROID Take 1 tablet (100 mcg total) by mouth daily before breakfast.   metoprolol succinate 25 MG 24 hr tablet Commonly known as: TOPROL-XL Take 0.5 tablets (12.5 mg total) by mouth daily.   omeprazole 40 MG capsule Commonly known as: PRILOSEC Take 1 capsule (40 mg total) by mouth 2 (two) times daily before a meal.   Pancrelipase (Lip-Prot-Amyl) 25000 units Cpep Commonly known as: Zenpep Take 2 with meals and snacks What changed:   how much to take  how to take this  when to take this  additional instructions   ramipril 2.5 MG capsule Commonly known as: ALTACE Take 1 capsule (2.5 mg total) by mouth daily.   simvastatin 20 MG tablet Commonly known as: ZOCOR Take 1 tablet (20 mg total) by mouth at bedtime.   Vitamin D3 50 MCG (2000 UT) capsule Take 2,000 Units by mouth daily.          Objective:   Physical Exam BP (!) 168/58 (BP Location: Left Arm)   Pulse (!) 43   Temp 97.7 F (36.5 C) (Oral)   Resp 16   Ht 5\' 10"  (1.778 m)   Wt 143 lb 6 oz (65 kg)   SpO2 100%   BMI 20.57 kg/m  General:   Well developed, NAD, BMI noted.  HEENT:  Normocephalic . Face symmetric, atraumatic Lungs:  CTA B Normal respiratory effort, no intercostal retractions, no accessory muscle use. Heart: RRR,  no murmur.  Abdomen:  Not distended, soft, non-tender. No rebound or rigidity.   Skin: Not pale. Not jaundice Lower extremities: no pretibial edema bilaterally  Neurologic:  alert & oriented X3.  Speech normal, gait appropriate for age and unassisted Psych--  Cognition and judgment appear intact.  Cooperative with normal attention span and concentration.  Behavior appropriate. No anxious or depressed appearing.     Assessment    Assessment DM -- w/ CAD, no neuropathy, intolerant to  metformin , sees ENDO HTN Hyperlipidemia Hypothyroidism DJD BPH, LUTS,h/o urolithiasis -- sees urology CV: --CAD, CABG 1997, cardiac cath 2009 --Peripheral vascular disease (no ABIs in the chart) --L leg edema, chronic, (-) DVT per [place] 2015 Gout GI: Pancreatic insufficiency dx 02-2017 Anemia: Mild, normal iron 2015 B12 deficiency, mild 2015  Wt loss:  2012 187 lb, 2014 173 lb , 2015 167 01-2017: EGD normal, BX chronic active gastritis. Colonoscopy normal. Saw GI 02-2017: likely d/t Pancreatic insufficiency and poorly controlled diabetes. Wt better w/ pancreat enzymes  Liver  lesion per Ct, liver  MRI 05-2017 benign, no further workup  H/o Tick fever 2012   PLAN: DM: Per Endo HTN: Currently on metoprolol, low-dose Altace, BP elevated today, recheck 168/58.  Typically is normal.  Recommend to check at home, see AVS.  Check a BMP (last sodium low). Hypothyroidism: On Synthroid, check a TSH Gastric ulcer: Per EGD last month, asx, on aspirin 81 due to history of CAD and PPIs. Pancreatic mass: Biopsies show atypical cells, this was discussed by the tumor board yesterday, will await their recommendations.  In the meantime the patient seems to be doing well. Low vitamin D: S/p ergocalciferol, recommend OTC vitamin D 2000 units B12 deficiency: On supplements, check levels. Flu shot recommended in the next few weeks RTC 4 months   This visit occurred during the SARS-CoV-2 public health emergency.  Safety protocols were in place, including screening questions prior to the visit, additional usage of staff PPE, and extensive cleaning of exam room while observing appropriate contact time as indicated for disinfecting solutions.

## 2020-08-31 NOTE — Progress Notes (Signed)
Pre visit review using our clinic review tool, if applicable. No additional management support is needed unless otherwise documented below in the visit note. 

## 2020-08-31 NOTE — Patient Instructions (Addendum)
Per our records you are due for an eye exam. Please contact your eye doctor to schedule an appointment. Please have them send copies of your office visit notes to [place]. Our fax number is [phone].  Your blood pressure is elevated today. Please check your blood pressure at the pharmacy twice a week for the next couple of weeks or get your on cuff Your BP should be less than 135/85. If you cannot check your blood pressure at the pharmacy, please call for a nurse appointment and will check it here next week.   Take over-the-counter vitamin D 2000 units every day  Get your flu shot in 3 to 4 weeks  GO TO THE LAB : Get the blood work     [place], [place] back for a checkup in 4 months

## 2020-09-01 ENCOUNTER — Telehealth: Payer: Self-pay | Admitting: Gastroenterology

## 2020-09-01 LAB — BASIC METABOLIC PANEL
BUN/Creatinine Ratio: 7 (calc) (ref 6–22)
BUN: 11 mg/dL (ref 7–25)
CO2: 24 mmol/L (ref 20–32)
Calcium: 8.4 mg/dL — ABNORMAL LOW (ref 8.6–10.3)
Chloride: 109 mmol/L (ref 98–110)
Creat: 1.55 mg/dL — ABNORMAL HIGH (ref 0.70–1.11)
Glucose, Bld: 106 mg/dL — ABNORMAL HIGH (ref 65–99)
Potassium: 5 mmol/L (ref 3.5–5.3)
Sodium: 138 mmol/L (ref 135–146)

## 2020-09-01 LAB — TSH: TSH: 2.51 mIU/L (ref 0.40–4.50)

## 2020-09-01 LAB — B12 AND FOLATE PANEL
Folate: 18.7 ng/mL
Vitamin B-12: 731 pg/mL (ref 200–1100)

## 2020-09-01 NOTE — Telephone Encounter (Signed)
Referral has been faxed to [place]

## 2020-09-01 NOTE — Assessment & Plan Note (Signed)
DM: Per Endo HTN: Currently on metoprolol, low-dose Altace, BP elevated today, recheck 168/58.  Typically is normal.  Recommend to check at home, see AVS.  Check a BMP (last sodium low). Hypothyroidism: On Synthroid, check a TSH Gastric ulcer: Per EGD last month, asx, on aspirin 81 due to history of CAD and PPIs. Pancreatic mass: Biopsies show atypical cells, this was discussed by the tumor board yesterday, will await their recommendations.  In the meantime the patient seems to be doing well. Low vitamin D: S/p ergocalciferol, recommend OTC vitamin D 2000 units B12 deficiency: On supplements, check levels. Flu shot recommended in the next few weeks RTC 4 months

## 2020-09-01 NOTE — Telephone Encounter (Signed)
I called and spoke with the patient and his wife on 08/31/2020. We went over the [facility] discussion from 08/30/2020. 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 Patient describes a stable weight. He has no abdominal pain. His appetite is good. His diabetes is under better control on his current regimen and he is not having significant episodes of hyper or hypoglycemia. The question is whether this high risk lesion in the setting of a mixed duct-IPMN is worth a surgical evaluation for potential resection. This is not an easy question at his age and with his current clinical status as being very stable. It is worthwhile however to have a surgical discussion and from there final decisions can be made as to whether surgery approach will be performed versus watchful waiting with the understanding of transformation risk to cancer at some point in the future potentially. Patient and wife agreed to this plan of action. We will forward a referral to Dr. Barry Dienes and/or Dr. Wilburn Cornelia for further evaluation and consideration of surgical evaluation. He will continue his PPI treatment as per Dr. Henrene Pastor. Should the patient begin to develop significant abdominal pains or weight loss or progressive issues with diabetes control then our concern for potential transformation increases significantly and surgical needs may be needed at that point. I will forward this to Dr. Henrene Pastor as well as to Dr. Larose Kells (his PCP). Patient and wife appreciative for all the care they have received to the [place] service.   Justice Britain, MD [facility] Office # [phone]

## 2020-09-04 ENCOUNTER — Other Ambulatory Visit: Payer: Self-pay

## 2020-09-04 ENCOUNTER — Encounter: Payer: Self-pay | Admitting: Endocrinology

## 2020-09-04 ENCOUNTER — Ambulatory Visit (INDEPENDENT_AMBULATORY_CARE_PROVIDER_SITE_OTHER): Payer: Medicare Other | Admitting: Endocrinology

## 2020-09-04 VITALS — BP 114/60 | HR 61 | Ht 70.0 in | Wt 142.8 lb

## 2020-09-04 DIAGNOSIS — E1165 Type 2 diabetes mellitus with hyperglycemia: Secondary | ICD-10-CM

## 2020-09-04 DIAGNOSIS — IMO0002 Reserved for concepts with insufficient information to code with codable children: Secondary | ICD-10-CM

## 2020-09-04 DIAGNOSIS — E1151 Type 2 diabetes mellitus with diabetic peripheral angiopathy without gangrene: Secondary | ICD-10-CM | POA: Diagnosis not present

## 2020-09-04 LAB — POCT GLYCOSYLATED HEMOGLOBIN (HGB A1C): Hemoglobin A1C: 7.4 % — AB (ref 4.0–5.6)

## 2020-09-04 MED ORDER — FREESTYLE LIBRE 2 SENSOR MISC: 1.0000 | 3 refills | Status: DC

## 2020-09-04 MED ORDER — INSULIN LISPRO (1 UNIT DIAL) 100 UNIT/ML (KWIKPEN)
PEN_INJECTOR | SUBCUTANEOUS | 11 refills | Status: DC
Start: 2020-09-04 — End: 2021-01-08

## 2020-09-04 MED ORDER — FREESTYLE LIBRE 2 READER DEVI: 1.0000 | Freq: Once | 1 refills | Status: AC

## 2020-09-04 NOTE — Patient Instructions (Addendum)
check your blood sugar 4 times a day.  vary the time of day when you check, between before the 3 meals, and at bedtime.  also check if you have symptoms of your blood sugar being too high or too low.  please keep a record of the readings and bring it to your next appointment here (or you can bring the meter itself).  You can write it on any piece of paper.  please call us sooner if your blood sugar goes below 70, or if you have a lot of readings over 200.   Please continue the same 2 insulins. I have sent a prescription to your pharmacy, for the continuous glucose monitor.   Please come back for a follow-up appointment in January.

## 2020-09-04 NOTE — Progress Notes (Signed)
Subjective:    Patient ID: Ethan Mcneil, male    DOB: 04/06/1934, 86 y.o.   MRN: [id number]  HPI Pt returns for f/u of diabetes mellitus: DM type: pancreatic insuff. Dx'ed: 8850 Complications: PN, CRI, CAD, and PAD.   Therapy: insulin since 2017 DKA: never Severe hypoglycemia: never Pancreatitis: once (2017) Pancreatic imaging: chronic atrophy with diffuse pancreatic duct dilatation.   SDOH: frail elderly state Other: he takes multiple daily injections; he declines pump.  Interval history: He brings his meter with his cbg's which I have reviewed today.  cbg's vary from 80-233.  No recent steroids.  He had to reduce supper humalog to 8 units, due to hypoglycemia at HS.   Past Medical History:  Diagnosis Date  . Arthritis   . CAD (coronary artery disease)    CABG 1997; grafts patent @ cath [facility] 11/09  . Chronic kidney disease   . DM2 (diabetes mellitus, type 2) ([place])   . Gout    after tick bite  . HLD (hyperlipidemia)   . HTN (hypertension)    essential nos  . Hypothyroid   . Pancreatic lesion   . Postsurgical aortocoronary bypass status   . PVD (peripheral vascular disease) ([place])   . S/P herniorrhaphy   . Tick fever 2012   [facility] [place]  . Unspecified hyperplasia of prostate without urinary obstruction and other lower urinary tract symptoms (LUTS)   . Urolithiasis 2015   hx of  . Wears dentures   . Wears glasses     Past Surgical History:  Procedure Laterality Date  . BIOPSY  08/07/2020   Procedure: BIOPSY;  Surgeon: Rush Landmark Telford Nab., MD;  Location: [place];  Service: Gastroenterology;;  . CORONARY ARTERY BYPASS GRAFT     x7 vessels 1997; cath 2002 and 2009; grafts patent   . CYSTOSCOPY WITH RETROGRADE PYELOGRAM, URETEROSCOPY AND STENT PLACEMENT Right 02/21/2014   Procedure: CYSTOSCOPY WITH RIGHT  RETROGRADE PYELOGRAM,RIGHT  URETEROSCOPY WITH STONE BASKETING EXTRACTION;  Surgeon: Irine Seal, MD;  Location: [facility];  Service: Urology;   Laterality: Right;  . ESOPHAGOGASTRODUODENOSCOPY (EGD) WITH PROPOFOL N/A 08/07/2020   Procedure: ESOPHAGOGASTRODUODENOSCOPY (EGD) WITH PROPOFOL;  Surgeon: Rush Landmark Telford Nab., MD;  Location: [place];  Service: Gastroenterology;  Laterality: N/A;  . EUS N/A 08/07/2020   Procedure: UPPER ENDOSCOPIC ULTRASOUND (EUS) LINEAR;  Surgeon: Irving Copas., MD;  Location: [place];  Service: Gastroenterology;  Laterality: N/A;  . FINE NEEDLE ASPIRATION N/A 08/07/2020   Procedure: FINE NEEDLE ASPIRATION (FNA) LINEAR;  Surgeon: Irving Copas., MD;  Location: [place];  Service: Gastroenterology;  Laterality: N/A;  . HERNIA REPAIR    . INGUINAL HERNIA REPAIR    . MULTIPLE TOOTH EXTRACTIONS    . SHOULDER SURGERY      Social History   Socioeconomic History  . Marital status: Married    Spouse name: [name]  . Number of children: 4  . Years of education: Not on file  . Highest education level: Not on file  Occupational History  . Occupation: retired, works few hours   Tobacco Use  . Smoking status: Never Smoker  . Smokeless tobacco: Never Used  Vaping Use  . Vaping Use: Never used  Substance and Sexual Activity  . Alcohol use: No  . Drug use: No  . Sexual activity: Not Currently  Other Topics Concern  . Not on file  Social History Narrative   Buyer, retail and former race car driver   4 children (boys)  Designated party form signed appointing wife - [name]; ok to leave msg on home phone [phone]         Social Determinants of Health   Financial Resource Strain: Low Risk   . Difficulty of Paying Living Expenses: Not hard at all  Food Insecurity: No Food Insecurity  . Worried About Charity fundraiser in the Last Year: Never true  . Ran Out of Food in the Last Year: Never true  Transportation Needs: No Transportation Needs  . Lack of Transportation (Medical): No  . Lack of Transportation (Non-Medical): No  Physical Activity:   . Days of  Exercise per Week: Not on file  . Minutes of Exercise per Session: Not on file  Stress:   . Feeling of Stress : Not on file  Social Connections:   . Frequency of Communication with Friends and Family: Not on file  . Frequency of Social Gatherings with Friends and Family: Not on file  . Attends Religious Services: Not on file  . Active Member of Clubs or Organizations: Not on file  . Attends Archivist Meetings: Not on file  . Marital Status: Not on file  Intimate Partner Violence:   . Fear of Current or Ex-Partner: Not on file  . Emotionally Abused: Not on file  . Physically Abused: Not on file  . Sexually Abused: Not on file    Current Outpatient Medications on File Prior to Visit  Medication Sig Dispense Refill  . acetaminophen (TYLENOL) 500 MG tablet Take 500 mg by mouth every 6 (six) hours as needed for moderate pain or headache.    Marland Kitchen aspirin EC 81 MG tablet Take 81 mg by mouth at bedtime.     . Cholecalciferol (VITAMIN D3) 50 MCG (2000 UT) capsule Take 2,000 Units by mouth daily.    . Cyanocobalamin (B-12) 1000 MCG TABS Take 1,000 mcg by mouth daily.    . ferrous sulfate 325 (65 FE) MG tablet Take 1 tablet (325 mg total) by mouth 2 (two) times daily before a meal. 60 tablet 6  . glucose blood (FREESTYLE LITE) test strip CHECK BLOOD SUGAR NO MORE THAN TWICE DAILY. 200 strip 0  . Insulin Glargine (BASAGLAR KWIKPEN) 100 UNIT/ML Inject 0.03 mLs (3 Units total) into the skin at bedtime. 5 pen 11  . Insulin Pen Needle (B-D UF III MINI PEN NEEDLES) 31G X 5 MM MISC 1 each by Other route 3 (three) times daily. E11.9 90 each 2  . levothyroxine (SYNTHROID) 100 MCG tablet Take 1 tablet (100 mcg total) by mouth daily before breakfast. 30 tablet 3  . metoprolol succinate (TOPROL-XL) 25 MG 24 hr tablet Take 0.5 tablets (12.5 mg total) by mouth daily. 90 tablet 1  . omeprazole (PRILOSEC) 40 MG capsule Take 1 capsule (40 mg total) by mouth 2 (two) times daily before a meal. 60 capsule 5   . Pancrelipase, Lip-Prot-Amyl, (ZENPEP) 25000 units CPEP Take 2 with meals and snacks (Patient taking differently: Take 25,000 Units by mouth See admin instructions. Take 25000 units with each meal and snack) 810 capsule 3  . ramipril (ALTACE) 2.5 MG capsule Take 1 capsule (2.5 mg total) by mouth daily. 90 capsule 1  . simvastatin (ZOCOR) 20 MG tablet Take 1 tablet (20 mg total) by mouth at bedtime. 90 tablet 1   No current facility-administered medications on file prior to visit.    Allergies  Allergen Reactions  . Ciprofloxacin Itching  . Codeine Nausea And Vomiting  Family History  Problem Relation Age of Onset  . Lung cancer Father   . Coronary artery disease Mother        stent  . Diabetes Brother   . Prostate cancer Maternal Uncle        in his 26s  . Stroke Son        GF  . Throat cancer Son   . Colon cancer Neg Hx   . Rectal cancer Neg Hx   . Stomach cancer Neg Hx   . Esophageal cancer Neg Hx   . Pancreatic cancer Neg Hx     BP 114/60   Pulse 61   Ht 5\' 10"  (1.778 m)   Wt 142 lb 12.8 oz (64.8 kg)   SpO2 97%   BMI 20.49 kg/m    Review of Systems Denies LOC    Objective:   Physical Exam VITAL SIGNS:  See vs page GENERAL: no distress Pulses: dorsalis pedis intact bilat.   MSK: no deformity of the feet CV: 1+ left leg leg edema Skin:  no ulcer on the feet.  normal color and temp on the feet. Neuro: sensation is intact to touch on the feet.    Lab Results  Component Value Date   HGBA1C 7.4 (A) 09/04/2020       Assessment & Plan:  DM, due to panc insuff.   Hypoglycemia, due to insulin: this limits aggressiveness of glycemic control.    Patient Instructions  check your blood sugar 4 times a day.  vary the time of day when you check, between before the 3 meals, and at bedtime.  also check if you have symptoms of your blood sugar being too high or too low.  please keep a record of the readings and bring it to your next appointment here (or you can  bring the meter itself).  You can write it on any piece of paper.  please call us sooner if your blood sugar goes below 70, or if you have a lot of readings over 200.   Please continue the same 2 insulins. I have sent a prescription to your pharmacy, for the continuous glucose monitor.   Please come back for a follow-up appointment in January.

## 2020-09-11 DIAGNOSIS — D490 Neoplasm of unspecified behavior of digestive system: Secondary | ICD-10-CM | POA: Diagnosis not present

## 2020-09-12 ENCOUNTER — Other Ambulatory Visit: Payer: Self-pay | Admitting: General Surgery

## 2020-09-12 DIAGNOSIS — D490 Neoplasm of unspecified behavior of digestive system: Secondary | ICD-10-CM

## 2020-10-03 ENCOUNTER — Telehealth: Payer: Self-pay

## 2020-10-03 NOTE — Telephone Encounter (Signed)
Left message for patient to call back regarding his call last night of low blood sugar and EMS visit.

## 2020-10-05 ENCOUNTER — Ambulatory Visit
Admission: RE | Admit: 2020-10-05 | Discharge: 2020-10-05 | Disposition: A | Discharge status: Home / Self Care | Payer: Medicare Other | Source: Ambulatory Visit | Attending: General Surgery | Admitting: General Surgery

## 2020-10-05 DIAGNOSIS — I700 Atherosclerosis of aorta: Secondary | ICD-10-CM | POA: Diagnosis not present

## 2020-10-05 DIAGNOSIS — D490 Neoplasm of unspecified behavior of digestive system: Secondary | ICD-10-CM

## 2020-10-05 DIAGNOSIS — Q453 Other congenital malformations of pancreas and pancreatic duct: Secondary | ICD-10-CM | POA: Diagnosis not present

## 2020-10-05 DIAGNOSIS — K8689 Other specified diseases of pancreas: Secondary | ICD-10-CM | POA: Diagnosis not present

## 2020-10-05 DIAGNOSIS — I2510 Atherosclerotic heart disease of native coronary artery without angina pectoris: Secondary | ICD-10-CM | POA: Diagnosis not present

## 2020-10-05 MED ORDER — IOPAMIDOL (ISOVUE-300) INJECTION 61%
80.0000 mL | Freq: Once | INTRAVENOUS | Status: AC | PRN
  Administered 2020-10-05: 80 mL via INTRAVENOUS

## 2020-10-09 ENCOUNTER — Other Ambulatory Visit: Payer: Self-pay | Admitting: General Surgery

## 2020-10-09 DIAGNOSIS — D490 Neoplasm of unspecified behavior of digestive system: Secondary | ICD-10-CM | POA: Diagnosis not present

## 2020-10-23 ENCOUNTER — Encounter (HOSPITAL_COMMUNITY): Payer: Self-pay

## 2020-10-23 ENCOUNTER — Other Ambulatory Visit: Payer: Self-pay

## 2020-10-23 ENCOUNTER — Other Ambulatory Visit (HOSPITAL_COMMUNITY)
Admission: RE | Admit: 2020-10-23 | Discharge: 2020-10-23 | Disposition: A | Discharge status: Home / Self Care | Payer: Medicare Other | Source: Ambulatory Visit | Attending: General Surgery | Admitting: General Surgery

## 2020-10-23 ENCOUNTER — Encounter (HOSPITAL_COMMUNITY)
Admission: RE | Admit: 2020-10-23 | Discharge: 2020-10-23 | Disposition: A | Discharge status: Home / Self Care | Payer: Medicare Other | Source: Ambulatory Visit | Attending: General Surgery | Admitting: General Surgery

## 2020-10-23 DIAGNOSIS — Z01818 Encounter for other preprocedural examination: Secondary | ICD-10-CM | POA: Insufficient documentation

## 2020-10-23 DIAGNOSIS — Z20822 Contact with and (suspected) exposure to covid-19: Secondary | ICD-10-CM | POA: Insufficient documentation

## 2020-10-23 DIAGNOSIS — D490 Neoplasm of unspecified behavior of digestive system: Secondary | ICD-10-CM

## 2020-10-23 HISTORY — DX: Anemia, unspecified: D64.9

## 2020-10-23 HISTORY — DX: Personal history of urinary calculi: Z87.442

## 2020-10-23 LAB — SARS CORONAVIRUS 2 (TAT 6-24 HRS): SARS Coronavirus 2: NEGATIVE

## 2020-10-23 LAB — CBC WITH DIFFERENTIAL/PLATELET
Abs Immature Granulocytes: 0.02 10*3/uL (ref 0.00–0.07)
Basophils Absolute: 0.1 10*3/uL (ref 0.0–0.1)
Basophils Relative: 2 %
Eosinophils Absolute: 0.7 10*3/uL — ABNORMAL HIGH (ref 0.0–0.5)
Eosinophils Relative: 12 %
HCT: 34.9 % — ABNORMAL LOW (ref 39.0–52.0)
Hemoglobin: 10.9 g/dL — ABNORMAL LOW (ref 13.0–17.0)
Immature Granulocytes: 0 %
Lymphocytes Relative: 24 %
Lymphs Abs: 1.3 10*3/uL (ref 0.7–4.0)
MCH: 31 pg (ref 26.0–34.0)
MCHC: 31.2 g/dL (ref 30.0–36.0)
MCV: 99.1 fL (ref 80.0–100.0)
Monocytes Absolute: 0.6 10*3/uL (ref 0.1–1.0)
Monocytes Relative: 10 %
Neutro Abs: 2.8 10*3/uL (ref 1.7–7.7)
Neutrophils Relative %: 52 %
Platelets: 169 10*3/uL (ref 150–400)
RBC: 3.52 MIL/uL — ABNORMAL LOW (ref 4.22–5.81)
RDW: 14.9 % (ref 11.5–15.5)
WBC: 5.5 10*3/uL (ref 4.0–10.5)
nRBC: 0 % (ref 0.0–0.2)

## 2020-10-23 LAB — COMPREHENSIVE METABOLIC PANEL
ALT: 14 U/L (ref 0–44)
AST: 19 U/L (ref 15–41)
Albumin: 3.3 g/dL — ABNORMAL LOW (ref 3.5–5.0)
Alkaline Phosphatase: 51 U/L (ref 38–126)
Anion gap: 7 (ref 5–15)
BUN: 7 mg/dL — ABNORMAL LOW (ref 8–23)
CO2: 24 mmol/L (ref 22–32)
Calcium: 8.5 mg/dL — ABNORMAL LOW (ref 8.9–10.3)
Chloride: 107 mmol/L (ref 98–111)
Creatinine, Ser: 2.13 mg/dL — ABNORMAL HIGH (ref 0.61–1.24)
GFR, Estimated: 30 mL/min — ABNORMAL LOW (ref >60)
Glucose, Bld: 99 mg/dL (ref 70–99)
Potassium: 4.9 mmol/L (ref 3.5–5.1)
Sodium: 138 mmol/L (ref 135–145)
Total Bilirubin: 0.2 mg/dL — ABNORMAL LOW (ref 0.3–1.2)
Total Protein: 5.9 g/dL — ABNORMAL LOW (ref 6.5–8.1)

## 2020-10-23 LAB — PREPARE RBC (CROSSMATCH)

## 2020-10-23 LAB — LIPASE, BLOOD: Lipase: 19 U/L (ref 11–51)

## 2020-10-23 LAB — GLUCOSE, CAPILLARY: Glucose-Capillary: 135 mg/dL — ABNORMAL HIGH (ref 70–99)

## 2020-10-23 LAB — PROTIME-INR
INR: 1.1 (ref 0.8–1.2)
Prothrombin Time: 13.7 seconds (ref 11.4–15.2)

## 2020-10-23 NOTE — Pre-Procedure Instructions (Addendum)
Ethan Mcneil  10/23/2020    Your procedure is scheduled on Thursday, October 26, 2020 at 9:00 AM.   Report to [facility] Entrance "A" Admitting Office at 7:00 AM.   Call this number if you have problems the morning of surgery: [phone]   Questions prior to day of surgery, please call [phone] between 8 & 4 PM.   Remember:  Do not eat food after midnight Wednesday, 10/24/20.  You may drink clear liquids until 6:00 AM. Clear liquids allowed ZCH:YIFOY, Juice (non-citric and without pulp - diabetics please choose diet or no sugar options), Carbonated beverages - (diabetics please choose diet or no sugar options), Clear Tea, Black Coffee only (no creamer, milk or cream including half and half) and Gatorade (diabetics please choose diet or no sugar options)    Take these medicines the morning of surgery with A SIP OF WATER: Levothyroxine (Synthroid), Metoprolol (Toprol XL), Omeprazole (Prilosec), Acetaminophen (Tylenol) - if needed.  Stop Aspirin as instructed by physician/surgeon. Stop Vitamins as of today prior to surgery. Do not use Aspirin containing products, NSAIDS (Ibuprofen, Aleve, etc), Fish Oil or Herbal medications prior to surgery.  Evening before surgery take 1 unit of Basaglar insulin (1/2 of regular dose). Do not take Humalog morning of surgery.  HOW TO MANAGE YOUR DIABETES BEFORE AND AFTER SURGERY  Why is it important to control my blood sugar before and after surgery? . Improving blood sugar levels before and after surgery helps healing and can limit problems. . A way of improving blood sugar control is eating a healthy diet by: o  Eating less sugar and carbohydrates o  Increasing activity/exercise o  Talking with your doctor about reaching your blood sugar goals . High blood sugars (greater than 180 mg/dL) can raise your risk of infections and slow your recovery, so you will need to focus on controlling your diabetes during the weeks before  surgery. . Make sure that the doctor who takes care of your diabetes knows about your planned surgery including the date and location.  How do I manage my blood sugar before surgery? . Check your blood sugar at least 4 times a day, starting 2 days before surgery, to make sure that the level is not too high or low. . Check your blood sugar the morning of your surgery when you wake up and every 2 hours until you get to the Short Stay unit. o If your blood sugar is less than 70 mg/dL, you will need to treat for low blood sugar: - Do not take insulin. - Treat a low blood sugar (less than 70 mg/dL) with  cup of clear juice (cranberry or apple), 4 glucose tablets, OR glucose gel. - Recheck blood sugar in 15 minutes after treatment (to make sure it is greater than 70 mg/dL). If your blood sugar is not greater than 70 mg/dL on recheck, call [phone] for further instructions. . Report your blood sugar to the short stay nurse when you get to Short Stay.  . If you are admitted to the hospital after surgery: o Your blood sugar will be checked by the staff and you will probably be given insulin after surgery (instead of oral diabetes medicines) to make sure you have good blood sugar levels. o The goal for blood sugar control after surgery is 80-180 mg/dL.    Do not wear jewelry.  Do not wear lotions, powders, cologne or deodorant.  Men may shave face and neck.  Do not bring valuables to  the hospital.  [facility] is not responsible for any belongings or valuables.  Contacts, dentures or bridgework may not be worn into surgery.  Leave your suitcase in the car.  After surgery it may be brought to your room.  For patients admitted to the hospital, discharge time will be determined by your treatment team.  [facility] - Preparing for Surgery  Before surgery, you can play an important role.  Because skin is not sterile, your skin needs to be as free of germs as possible.  You can reduce the number of  germs on you skin by washing with CHG (chlorahexidine gluconate) soap before surgery.  CHG is an antiseptic cleaner which kills germs and bonds with the skin to continue killing germs even after washing.  Oral Hygiene is also important in reducing the risk of infection.  Remember to brush your teeth with your regular toothpaste the morning of surgery.  Please DO NOT use if you have an allergy to CHG or antibacterial soaps.  If your skin becomes reddened/irritated stop using the CHG and inform your nurse when you arrive at Short Stay.  Do not shave (including legs and underarms) for at least 48 hours prior to the first CHG shower.  You may shave your face.  Please follow these instructions carefully:   1.  Shower with CHG Soap the night before surgery and the morning of Surgery.  2.  If you choose to wash your hair, wash your hair first as usual with your normal shampoo.  3.  After you shampoo, rinse your hair and body thoroughly to remove the shampoo. 4.  Use CHG as you would any other liquid soap.  You can apply chg directly to the skin and wash gently with a      scrungie or washcloth.           5.  Apply the CHG Soap to your body ONLY FROM THE NECK DOWN.   Do not use on open wounds or open sores. Avoid contact with your eyes, ears, mouth and genitals (private parts).  Wash genitals (private parts) with your normal soap - do this prior to using CHG soap.  6.  Wash thoroughly, paying special attention to the area where your surgery will be performed.  7.  Thoroughly rinse your body with warm water from the neck down.  8.  DO NOT shower/wash with your normal soap after using and rinsing off the CHG Soap.  9.  Pat yourself dry with a clean towel.            10.  Wear clean pajamas.            11.  Place clean sheets on your bed the night of your first shower and do not sleep with pets.  Day of Surgery  Shower as above. Do not apply any lotions/deodorants the morning of surgery.   Please wear  clean clothes to the hospital. Remember to brush your teeth with toothpaste.  Please read over the fact sheets that you were given.

## 2020-10-23 NOTE — Progress Notes (Signed)
PCP - Dr. Kathlene November  Chest x-ray - 05/21/20 (CE) EKG - 07/24/20  Fasting Blood Sugar - around 125 CBG - 135 Checks Blood Sugar ___several __ times a day, pt has the [employer] 2 sensor  Aspirin Instructions: last dose 10/16/20  ERAS Protcol - no drink   COVID TEST- 10/23/20   Anesthesia review: yes, distant heart hx. No longer sees a cardiologist, Dr. Larose Kells takes care of his cardiac issues  Patient denies shortness of breath, fever, cough and chest pain at PAT appointment   All instructions explained to the patient, with a verbal understanding of the material. Patient agrees to go over the instructions while at home for a better understanding. Patient also instructed to self quarantine after being tested for COVID-19. The opportunity to ask questions was provided.

## 2020-10-23 NOTE — H&P (Signed)
[name] Appointment: 10/09/2020 11:00 AM Location: [place] [facility] Patient #: [id number] DOB: Nov 01, 1934 Married / Language: Cleophus Molt / Race: White Male   History of Present Illness Stark Klein MD; 10/09/2020 11:35 AM) The patient is a 86 year old male who presents for a follow-up for Pancreatic mass. Pt is an 86 yo M who is referred by Dr. Rush Landmark for a pancreatic body mass 08/2020. He has been followed by Dr. Henrene Pastor for several years for pancreatic insufficiency/chronic pancreatitis. He was recently seen to have a pancreatic on CT done for pain under his left ribs. He had clinical pancreatitis at that point. The CT showed the atrophy that had been present since at least 2015 with pancreatic duct dilation. It also showed a 3 cm pancreatic mass. This has been worked up wtih MRI and EUS. He had classic fish mouth deformity of ampulla and mixed solid/cystic mass in the distal body of the pancreas on endoscopy. EUS showed size of 2.7 cm in greatest dimension. There was abutment of the SMA on EUS. The CEA was very elevated on the FNA. He had another episode of pain in August that brought him to [facility]. He lives with his wife who has mobility issues. he does all the laundry and vacuuming as well as takes care of his yard. He denies current pain. He had a CABG in 1997 and has been reevaluated in 2009 with widely patent grafts. He has no chest pain. He does have h/o kidney stones.   I got a repeat CT on him to see if anything had changed. He was also going to think about it and discuss with his family. he is back to discuss. His repeat CT was done pancreatic protocol and not much had significantly changed. The mass continued to be 3.5-4 cm in size, and it continued to have some nodularity. He has been continuing his daily activities to help his wife and is doing his yard work. He hasn't had any new episodes of abdominal pain. He did have an episode of hypoglycemia. He is having  some low back pain.    CT 10/06/2020 IMPRESSION: 1. Large low-attenuation lesion in the distal pancreatic body, similar to recent prior examinations, again concerning for neoplasm such as intraductal papillary mucinous neoplasm (IPMN). This is once again associated with dilatation of the main pancreatic duct. 2. No definitive findings to suggest metastatic disease in the abdomen or pelvis. 3. Aortic atherosclerosis, in addition to least left circumflex coronary artery disease. 4. Additional incidental findings, as above.   CT abd/pelvis 05/2020  IMPRESSION: 1. Findings suspicious for 3.1 cm cystic and soft tissue pancreatic mass, not seen on prior exam, suspicious for neoplasm. Recommend further evaluation with pancreatic protocol MRI. Diffuse pancreatic atrophy and ductal dilatation, unchanged and likely sequela of chronic pancreatitis. 2. Wall thickening versus nondistention involving the splenic flexure of the colon, can be seen with colitis. 3. Multiple low-density lesions in the liver, similar to prior exam, previously characterized as cysts on MRI.  MR abd/pelvis 05/25/2020 IMPRESSION: 1. There is a nonenhancing, lobulated cystic lesion of the pancreatic duct measuring 3.6 x 3.3 x 2.2 cm which is new when compared to MR examination dated 06/05/2017. Dilatation of the main pancreatic duct is a high risk feature in the setting of a cystic pancreatic neoplasm and FNA/EUS should be considered. If surveillance imaging is elected, recommend initial follow-up imaging by multiphasic contrast enhanced MRI in 2 years. This recommendation follows ACR consensus guidelines: Management of Incidental Pancreatic Cysts: A  White Paper of the ACR Incidental Findings Committee. [place] 4081;[id number]. 2. Pancreatic divisum. 3. Numerous tiny fluid signal cysts throughout the liver parenchyma, likely cysts and similar to prior examination.  upper GI 08/18/2020 IMPRESSION: 1.  Narrow necked linear ulceration in the anterior aspect of the duodenal bulb. This does not appear to fistulize into adjacent structures or other loops of bowel at this time. 2. 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   EUS mansouraty 08/07/2020 EGD Impression: - No gross mucosal lesions in esophagus. - Widely patent and non-obstructing Schatzki ring. - 1 cm hiatal hernia. - Gastric extrinsic impression noted on the lesser curvature of the stomach - consistent with area in pancreas worked on today. - Erythematous mucosa in the gastric body and antrum. - No other gross lesions in the stomach. Biopsied. - Non-bleeding duodenal ulcer vs fistula with drainage noted. Biopsied. 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 EUS Impression: - A mixed cystic/solid mass was identified in the pancreatic body/tail region. Tissue was obtained from this exam, and results are pending. However, the endosonographic appearance is suspicious for an intraductal papillary mucinous neoplasm of the main duct with concern for possible transformation. Fine needle aspiration for fluid performed. Fine needle biopsy performed. - Pancreatic parenchymal abnormalities consisting of atrophy and hyperechoic strands were noted in the entire pancreas. - There was no sign of significant pathology in the common bile duct and in the common hepatic duct. - A few cystic lesions were found in the left lobe of the liver but no other lesions noted. - No malignant-appearing lymph nodes were visualized in the celiac region (level 20), perigastric region, peripancreatic region and porta hepatis region.  cytoloty 08/07/2020 A. PANCREATIC BODY LESION, FINE NEEDLE ASPIRATION: - Atypical cells present. - See comment.  COMMENT: There are epithelial cells with some having atypical cytologic features. Immunohistochemistry  on the cell block with CA-IX shows focal positivity and inhibin is negative. Ki-67 staining is low. The findings are concerning for a low grade neoplastic process    Allergies Emeline Gins, CMA; 10/09/2020 10:57 AM) Codeine and Related  Allergies Reconciled   Medication History Emeline Gins, CMA; 10/09/2020 10:57 AM) Aspirin (81MG Tablet, Oral) Active. Levothyroxine Sodium (100MCG Tablet, Oral) Active. Metoprolol Succinate ER (25MG Tablet ER 24HR, Oral) Active. Ramipril (2.5MG Capsule, Oral) Active. Simvastatin (20MG Tablet, Oral) Active. Ferrous Sulfate (325 (65 Fe)MG Tablet, Oral) Active. Vitamin D (Ergocalciferol) (1.25 MG(50000 UT) Capsule, Oral) Active. Medications Reconciled    Review of Systems Stark Klein MD; 10/09/2020 11:35 AM) All other systems negative  Vitals Emeline Gins CMA; 10/09/2020 10:56 AM) 10/09/2020 10:56 AM Weight: 146 lb Height: 70in Body Surface Area: 1.83 m Body Mass Index: 20.95 kg/m  Temp.: 97.47F  Pulse: 56 (Regular)  BP: 126/76(Sitting, Left Arm, Standard)       Physical Exam Stark Klein MD; 10/09/2020 11:35 AM) General Mental Status-Alert. General Appearance-Consistent with stated age. Hydration-Well hydrated. Voice-Normal.  Head and Neck Head-normocephalic, atraumatic with no lesions or palpable masses.  Eye Sclera/Conjunctiva - Bilateral-No scleral icterus.  Chest and Lung Exam Chest and lung exam reveals -quiet, even and easy respiratory effort with no use of accessory muscles. Inspection Chest Wall - Normal. Back - normal.  Breast - Did not examine.  Cardiovascular Cardiovascular examination reveals -normal pedal pulses bilaterally. Note: regular rate and rhythm  Abdomen Inspection-Inspection Normal. Palpation/Percussion Palpation and Percussion of the abdomen reveal - Soft, Non Tender,  No Rebound tenderness, No Rigidity (guarding) and No  hepatosplenomegaly. Note: very thin. ? fullness in the LUQ.   Peripheral Vascular Upper Extremity Inspection - Bilateral - Normal - No Clubbing, No Cyanosis, No Edema, Pulses Intact. Lower Extremity Palpation - Edema - Bilateral - No edema - Bilateral.  Neurologic Neurologic evaluation reveals -alert and oriented x 3 with no impairment of recent or remote memory. Mental Status-Normal.  Musculoskeletal Global Assessment -Note: no gross deformities.  Normal Exam - Left-Upper Extremity Strength Normal and Lower Extremity Strength Normal. Normal Exam - Right-Upper Extremity Strength Normal and Lower Extremity Strength Normal.  Lymphatic Head & Neck  General Head & Neck Lymphatics: Bilateral - Description - Normal. Axillary  General Axillary Region: Bilateral - Description - Normal. Tenderness - Non Tender.    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 This has been progressing now for many years. There would be potential benefit from resection, but he is 86. It is possible that he may already have transformation of the IPMN to cancer.  he did have some vague nodularity of this mass.  He would like to pursue surgery. I reviewed again what surgery would entail. i would do this open because of concern regarding proximity to SMA and celiac axis. I reviewed risk of infection and persistent time in hospital/drain placement. I discussed risk of needing rehab or SNF temporarily. His son was also present. He wishes to proceed with surgery.  I discussed a minimum of 3-6 months of recovery time.  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 Current Plans You are being scheduled for surgery- Our schedulers will call you.  You should hear from our office's scheduling department within 5 working days about the location, date, and time of surgery. We try  to make accommodations for patient's preferences in scheduling surgery, but sometimes the OR schedule or the surgeon's schedule prevents [place] from making those accommodations.  If you have not heard from our office [phone]) in 5 working days, call the office and ask for your surgeon's nurse.  If you have other questions about your diagnosis, plan, or surgery, call the office and ask for your surgeon's nurse.  Pt Education - FB pancreatectomy   Signed electronically by Stark Klein, MD (10/09/2020 11:39 AM)

## 2020-10-24 ENCOUNTER — Telehealth: Payer: Self-pay

## 2020-10-24 NOTE — Telephone Encounter (Signed)
Surgical clearance letter faxed to ATTN: Mammie Lorenzo at [phone].

## 2020-10-24 NOTE — Telephone Encounter (Signed)
86 year old gentleman, history of CAD, CABG 1997, cardiac cath 2009, peripheral vascular disease, diabetes, HTN in need of extensive abdominal surgery. I spoke with him today:  No chest pain, no difficulty breathing, is able to go up 1 flight of stairs without any problems.  No lower extremity edema. Most recent EKG is few weeks ago and is normal. No heart murmur noted during the last office visit. He has not seen cardiology or have any cardiac testing in years.  Given lack of symptoms and good functional status, I will clear him from the medical/cardiac standpoint. (case d/w DOD @ cardiology office)

## 2020-10-24 NOTE — Progress Notes (Signed)
Anesthesia Chart Review:  Hx of remote CABG 1997 with patent grafts by cath 2009. Last seen by Dr. Johnsie Cancel 10/29/2010 and per note "He has some SSCP in 10/2008 and cath showed widely patent grafts that looked quite pristine for there age." Pt has subsequently been followed by PCP Dr. Kathlene November for risk factor management and monitoring of CAD.    Patient was cleared by Dr. Larose Kells per note in epic 10/24/2020, "86 year old gentleman, history of CAD, CABG 1997, cardiac cath 2009, peripheral vascular disease, diabetes, HTN in need of extensive abdominal surgery. I spoke with him today: No chest pain, no difficulty breathing, is able to go up 1 flight of stairs without any problems.  No lower extremity edema. Most recent EKG is few weeks ago and is normal. No heart murmur noted during the last office visit. He has not seen cardiology or have any cardiac testing in years. Given lack of symptoms and good functional status, I will clear him from the medical/cardiac standpoint. (case d/w DOD @ cardiology office)"  Reasonably well controlled IDDMII followed by endocrinology with last A1c 7.4 on 09/04/2020.  Preop labs reviewed, creatinine elevated 2.13, review of history shows baseline to be approximately 1.5.  Mild anemia with hemoglobin 10.9.  Remainder of labs unremarkable.  Labs and history reviewed with Dr. Marcie Bal.  He advised okay to proceed as planned.  EKG 07/23/20: NSR. Rate 67.  LHC 10/24/2008:  CONCLUSION:  1. Coronary artery status post coronary bypass graft surgery in 1997.  2. 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  3. Patent sequential vein graft to the marginal and posterior      descending branches of the right coronary artery, patent sequential      vein graft to the marginal and posterolateral branch of the      circumflex artery, patent vein graft to the diagonal branch of the      LAD, and patent LIMA graft to the LAD.  4.  Normal LV function.   RECOMMENDATIONS:  All six grafts are patent and they all look well with  only minimal luminal irregularities despite the fact that they are 12  years out since bypass surgery.  The only potential source of ischemia  was the first marginal branch which was not grafted.  I will try and  look at the old films and see if this is changed.  We will plan  reassurance and continued medical therapy.   Wynonia Musty [facility] Short Stay [facility]/Anesthesiology Phone [phone] 10/25/2020 9:53 AM

## 2020-10-24 NOTE — Telephone Encounter (Signed)
Received fax confirmation

## 2020-10-24 NOTE — Telephone Encounter (Signed)
Received urgent surgical clearance request from [facility]. Pt is scheduled 10/26/2020 for open pancreatectomy with Dr. Barry Dienes. Requesting back ASAP.

## 2020-10-24 NOTE — Telephone Encounter (Signed)
Duplicate-opened in error. 

## 2020-10-25 NOTE — Anesthesia Preprocedure Evaluation (Addendum)
Anesthesia Evaluation  Patient identified by MRN, date of birth, ID band Patient awake    Reviewed: Allergy & Precautions, NPO status , Patient's Chart, lab work & pertinent test results  Airway Mallampati: II  TM Distance: >3 FB Neck ROM: Full    Dental no notable dental hx. (+) Edentulous Lower, Partial Lower, Teeth Intact   Pulmonary neg pulmonary ROS,    Pulmonary exam normal breath sounds clear to auscultation       Cardiovascular hypertension, Pt. on medications and Pt. on home beta blockers + CAD and + Peripheral Vascular Disease  negative cardio ROS Normal cardiovascular exam Rhythm:Regular Rate:Normal     Neuro/Psych negative neurological ROS  negative psych ROS   GI/Hepatic negative GI ROS, Neg liver ROS,   Endo/Other  negative endocrine ROSdiabetes, Type 2Hypothyroidism   Renal/GU negative Renal ROS  negative genitourinary   Musculoskeletal negative musculoskeletal ROS (+) Arthritis , Osteoarthritis,    Abdominal   Peds negative pediatric ROS (+)  Hematology negative hematology ROS (+) anemia ,   Anesthesia Other Findings   Reproductive/Obstetrics negative OB ROS                          Anesthesia Physical Anesthesia Plan  ASA: IV  Anesthesia Plan: General and Epidural   Post-op Pain Management: GA combined w/ Regional for post-op pain   Induction: Intravenous  PONV Risk Score and Plan: 2 and Ondansetron and Treatment may vary due to age or medical condition  Airway Management Planned: Oral ETT  Additional Equipment: Arterial line  Intra-op Plan:   Post-operative Plan: Extubation in OR  Informed Consent: I have reviewed the patients History and Physical, chart, labs and discussed the procedure including the risks, benefits and alternatives for the proposed anesthesia with the patient or authorized representative who has indicated his/her understanding and  acceptance.     Dental advisory given  Plan Discussed with: Anesthesiologist and CRNA  Anesthesia Plan Comments: (PAT note by Karoline Caldwell, PA-C:  Hx of remote CABG 1997 with patent grafts by cath 2009. Last seen by Dr. Johnsie Cancel 10/29/2010 and per note "He has some SSCP in 10/2008 and cath showed widely patent grafts that looked quite pristine for there age." Pt has subsequently been followed by PCP Dr. Kathlene November for risk factor management and monitoring of CAD.    Patient was cleared by Dr. Larose Kells per note in epic 10/24/2020, "86 year old gentleman, history of CAD, CABG 1997, cardiac cath 2009, peripheral vascular disease, diabetes, HTN in need of extensive abdominal surgery. I spoke with him today: No chest pain, no difficulty breathing, is able to go up 1 flight of stairs without any problems.  No lower extremity edema. Most recent EKG is few weeks ago and is normal. No heart murmur noted during the last office visit. He has not seen cardiology or have any cardiac testing in years. Given lack of symptoms and good functional status, I will clear him from the medical/cardiac standpoint. (case d/w DOD @ cardiology office)"  Reasonably well controlled IDDMII followed by endocrinology with last A1c 7.4 on 09/04/2020.  Preop labs reviewed, creatinine elevated 2.13, review of history shows baseline to be approximately 1.5.  Mild anemia with hemoglobin 10.9.  Remainder of labs unremarkable.  Labs and history reviewed with Dr. Marcie Bal.  Advised okay to proceed as planned.  EKG 07/23/20: NSR. Rate 67.  LHC 10/24/2008:  CONCLUSION:  1. Coronary artery status post coronary bypass graft surgery in 1997.  2. 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  3. Patent sequential vein graft to the marginal and posterior      descending branches of the right coronary artery, patent sequential      vein graft to the marginal and posterolateral branch of  the      circumflex artery, patent vein graft to the diagonal branch of the      LAD, and patent LIMA graft to the LAD.  4. Normal LV function.   RECOMMENDATIONS:  All six grafts are patent and they all look well with  only minimal luminal irregularities despite the fact that they are 12  years out since bypass surgery.  The only potential source of ischemia  was the first marginal branch which was not grafted.  I will try and  look at the old films and see if this is changed.  We will plan  reassurance and continued medical therapy.   )      Anesthesia Quick Evaluation

## 2020-10-26 ENCOUNTER — Encounter (HOSPITAL_COMMUNITY): Payer: Self-pay | Admitting: General Surgery

## 2020-10-26 ENCOUNTER — Inpatient Hospital Stay (HOSPITAL_COMMUNITY)
Admission: RE | Admit: 2020-10-26 | Discharge: 2020-11-02 | DRG: 406 | Disposition: A | Discharge status: Home / Self Care | Payer: Medicare Other | Attending: General Surgery | Admitting: General Surgery

## 2020-10-26 ENCOUNTER — Inpatient Hospital Stay (HOSPITAL_COMMUNITY): Payer: Medicare Other | Admitting: Anesthesiology

## 2020-10-26 ENCOUNTER — Inpatient Hospital Stay (HOSPITAL_COMMUNITY): Payer: Medicare Other | Admitting: Vascular Surgery

## 2020-10-26 ENCOUNTER — Other Ambulatory Visit: Payer: Self-pay

## 2020-10-26 ENCOUNTER — Encounter (HOSPITAL_COMMUNITY)
Admission: RE | Disposition: A | Discharge status: Home / Self Care | Payer: Self-pay | Source: Home / Self Care | Attending: General Surgery

## 2020-10-26 DIAGNOSIS — Z23 Encounter for immunization: Secondary | ICD-10-CM | POA: Diagnosis not present

## 2020-10-26 DIAGNOSIS — D490 Neoplasm of unspecified behavior of digestive system: Secondary | ICD-10-CM

## 2020-10-26 DIAGNOSIS — Z951 Presence of aortocoronary bypass graft: Secondary | ICD-10-CM | POA: Diagnosis not present

## 2020-10-26 DIAGNOSIS — D630 Anemia in neoplastic disease: Secondary | ICD-10-CM | POA: Diagnosis not present

## 2020-10-26 DIAGNOSIS — Z7982 Long term (current) use of aspirin: Secondary | ICD-10-CM | POA: Diagnosis not present

## 2020-10-26 DIAGNOSIS — I10 Essential (primary) hypertension: Secondary | ICD-10-CM | POA: Diagnosis present

## 2020-10-26 DIAGNOSIS — Z79890 Hormone replacement therapy (postmenopausal): Secondary | ICD-10-CM

## 2020-10-26 DIAGNOSIS — I2510 Atherosclerotic heart disease of native coronary artery without angina pectoris: Secondary | ICD-10-CM | POA: Diagnosis present

## 2020-10-26 DIAGNOSIS — E1151 Type 2 diabetes mellitus with diabetic peripheral angiopathy without gangrene: Secondary | ICD-10-CM | POA: Diagnosis present

## 2020-10-26 DIAGNOSIS — G8918 Other acute postprocedural pain: Secondary | ICD-10-CM | POA: Diagnosis not present

## 2020-10-26 DIAGNOSIS — Z79899 Other long term (current) drug therapy: Secondary | ICD-10-CM | POA: Diagnosis not present

## 2020-10-26 DIAGNOSIS — K861 Other chronic pancreatitis: Secondary | ICD-10-CM | POA: Diagnosis present

## 2020-10-26 DIAGNOSIS — E785 Hyperlipidemia, unspecified: Secondary | ICD-10-CM | POA: Diagnosis present

## 2020-10-26 DIAGNOSIS — E039 Hypothyroidism, unspecified: Secondary | ICD-10-CM | POA: Diagnosis present

## 2020-10-26 DIAGNOSIS — Z885 Allergy status to narcotic agent status: Secondary | ICD-10-CM

## 2020-10-26 DIAGNOSIS — D136 Benign neoplasm of pancreas: Principal | ICD-10-CM | POA: Diagnosis present

## 2020-10-26 DIAGNOSIS — Z20822 Contact with and (suspected) exposure to covid-19: Secondary | ICD-10-CM | POA: Diagnosis present

## 2020-10-26 DIAGNOSIS — R160 Hepatomegaly, not elsewhere classified: Secondary | ICD-10-CM | POA: Diagnosis present

## 2020-10-26 DIAGNOSIS — D1809 Hemangioma of other sites: Secondary | ICD-10-CM | POA: Diagnosis not present

## 2020-10-26 HISTORY — PX: SPLENECTOMY, TOTAL: SHX788

## 2020-10-26 LAB — POCT I-STAT 7, (LYTES, BLD GAS, ICA,H+H)
Acid-base deficit: 4 mmol/L — ABNORMAL HIGH (ref 0.0–2.0)
Bicarbonate: 21.3 mmol/L (ref 20.0–28.0)
Calcium, Ion: 1.07 mmol/L — ABNORMAL LOW (ref 1.15–1.40)
HCT: 23 % — ABNORMAL LOW (ref 39.0–52.0)
Hemoglobin: 7.8 g/dL — ABNORMAL LOW (ref 13.0–17.0)
O2 Saturation: 100 %
Patient temperature: 34.4
Potassium: 4.7 mmol/L (ref 3.5–5.1)
Sodium: 138 mmol/L (ref 135–145)
TCO2: 22 mmol/L (ref 22–32)
pCO2 arterial: 34.6 mmHg (ref 32.0–48.0)
pH, Arterial: 7.386 (ref 7.350–7.450)
pO2, Arterial: 266 mmHg — ABNORMAL HIGH (ref 83.0–108.0)

## 2020-10-26 LAB — BASIC METABOLIC PANEL
Anion gap: 7 (ref 5–15)
BUN: 12 mg/dL (ref 8–23)
CO2: 20 mmol/L — ABNORMAL LOW (ref 22–32)
Calcium: 7.3 mg/dL — ABNORMAL LOW (ref 8.9–10.3)
Chloride: 110 mmol/L (ref 98–111)
Creatinine, Ser: 1.6 mg/dL — ABNORMAL HIGH (ref 0.61–1.24)
GFR, Estimated: 42 mL/min — ABNORMAL LOW (ref >60)
Glucose, Bld: 163 mg/dL — ABNORMAL HIGH (ref 70–99)
Potassium: 4.8 mmol/L (ref 3.5–5.1)
Sodium: 137 mmol/L (ref 135–145)

## 2020-10-26 LAB — CBC
HCT: 28 % — ABNORMAL LOW (ref 39.0–52.0)
Hemoglobin: 9 g/dL — ABNORMAL LOW (ref 13.0–17.0)
MCH: 31.3 pg (ref 26.0–34.0)
MCHC: 32.1 g/dL (ref 30.0–36.0)
MCV: 97.2 fL (ref 80.0–100.0)
Platelets: 119 10*3/uL — ABNORMAL LOW (ref 150–400)
RBC: 2.88 MIL/uL — ABNORMAL LOW (ref 4.22–5.81)
RDW: 14.5 % (ref 11.5–15.5)
WBC: 13.4 10*3/uL — ABNORMAL HIGH (ref 4.0–10.5)
nRBC: 0 % (ref 0.0–0.2)

## 2020-10-26 LAB — GLUCOSE, CAPILLARY
Glucose-Capillary: 115 mg/dL — ABNORMAL HIGH (ref 70–99)
Glucose-Capillary: 106 mg/dL — ABNORMAL HIGH (ref 70–99)
Glucose-Capillary: 150 mg/dL — ABNORMAL HIGH (ref 70–99)
Glucose-Capillary: 205 mg/dL — ABNORMAL HIGH (ref 70–99)

## 2020-10-26 LAB — PREPARE RBC (CROSSMATCH)

## 2020-10-26 LAB — ABO/RH: ABO/RH(D): O POS

## 2020-10-26 SURGERY — PANCREATECTOMY
Anesthesia: Epidural | Site: Abdomen

## 2020-10-26 MED ORDER — LABETALOL HCL 5 MG/ML IV SOLN
INTRAVENOUS | Status: DC | PRN
  Administered 2020-10-26 (×2): 5 mg via INTRAVENOUS

## 2020-10-26 MED ORDER — ONDANSETRON HCL 4 MG/2ML IJ SOLN
INTRAMUSCULAR | Status: AC
  Filled 2020-10-26: qty 2

## 2020-10-26 MED ORDER — ALBUMIN HUMAN 5 % IV SOLN
12.5000 g | Freq: Once | INTRAVENOUS | Status: AC
  Administered 2020-10-26: 12.5 g via INTRAVENOUS

## 2020-10-26 MED ORDER — LIDOCAINE 2% (20 MG/ML) 5 ML SYRINGE
INTRAMUSCULAR | Status: AC
  Filled 2020-10-26: qty 5

## 2020-10-26 MED ORDER — PROCHLORPERAZINE EDISYLATE 10 MG/2ML IJ SOLN: 10.0000 mg | Freq: Four times a day (QID) | INTRAMUSCULAR | Status: DC | PRN

## 2020-10-26 MED ORDER — FENTANYL CITRATE (PF) 100 MCG/2ML IJ SOLN: 50.0000 ug | Freq: Once | INTRAMUSCULAR | Status: AC

## 2020-10-26 MED ORDER — MIDAZOLAM HCL 2 MG/2ML IJ SOLN: 2.0000 mg | Freq: Once | INTRAMUSCULAR | Status: AC

## 2020-10-26 MED ORDER — DIPHENHYDRAMINE HCL 50 MG/ML IJ SOLN: 12.5000 mg | Freq: Four times a day (QID) | INTRAMUSCULAR | Status: DC | PRN

## 2020-10-26 MED ORDER — SUGAMMADEX SODIUM 200 MG/2ML IV SOLN
INTRAVENOUS | Status: DC | PRN
  Administered 2020-10-26: 130 mg via INTRAVENOUS

## 2020-10-26 MED ORDER — LACTATED RINGERS IV SOLN: INTRAVENOUS | Status: DC

## 2020-10-26 MED ORDER — SIMVASTATIN 20 MG PO TABS
20.0000 mg | ORAL_TABLET | Freq: Every day | ORAL | Status: DC
  Administered 2020-10-26 – 2020-11-01 (×7): 20 mg via ORAL
  Filled 2020-10-26 (×7): qty 1

## 2020-10-26 MED ORDER — ROPIVACAINE HCL 2 MG/ML IJ SOLN
2.0000 mL/h | INTRAMUSCULAR | Status: AC
  Administered 2020-10-26: 10 mL/h via EPIDURAL
  Filled 2020-10-26: qty 200

## 2020-10-26 MED ORDER — DIPHENHYDRAMINE HCL 12.5 MG/5ML PO ELIX
12.5000 mg | ORAL_SOLUTION | Freq: Four times a day (QID) | ORAL | Status: DC | PRN
  Filled 2020-10-26: qty 10

## 2020-10-26 MED ORDER — METOPROLOL TARTRATE 12.5 MG HALF TABLET
12.5000 mg | ORAL_TABLET | Freq: Two times a day (BID) | ORAL | Status: DC
  Administered 2020-10-26 – 2020-11-02 (×12): 12.5 mg via ORAL
  Filled 2020-10-26 (×14): qty 1

## 2020-10-26 MED ORDER — ROPIVACAINE HCL 2 MG/ML IJ SOLN
10.0000 mL/h | INTRAMUSCULAR | Status: AC
  Administered 2020-10-27 – 2020-10-29 (×3): 10 mL/h via EPIDURAL
  Administered 2020-10-30: 8 mL/h via EPIDURAL
  Filled 2020-10-26 (×9): qty 200

## 2020-10-26 MED ORDER — INSULIN GLARGINE 100 UNIT/ML ~~LOC~~ SOLN
3.0000 [IU] | Freq: Every day | SUBCUTANEOUS | Status: DC
  Administered 2020-10-26 – 2020-11-01 (×7): 3 [IU] via SUBCUTANEOUS
  Filled 2020-10-26 (×9): qty 0.03

## 2020-10-26 MED ORDER — LEVOTHYROXINE SODIUM 100 MCG PO TABS
100.0000 ug | ORAL_TABLET | Freq: Every day | ORAL | Status: DC
  Administered 2020-10-27 – 2020-11-02 (×7): 100 ug via ORAL
  Filled 2020-10-26 (×7): qty 1

## 2020-10-26 MED ORDER — FENTANYL CITRATE (PF) 250 MCG/5ML IJ SOLN
INTRAMUSCULAR | Status: AC
  Filled 2020-10-26: qty 5

## 2020-10-26 MED ORDER — ORAL CARE MOUTH RINSE: 15.0000 mL | Freq: Once | OROMUCOSAL | Status: AC

## 2020-10-26 MED ORDER — FENTANYL 50 MCG/ML IV PCA SOLN
INTRAVENOUS | Status: DC
  Administered 2020-10-26 (×2): 30 ug via INTRAVENOUS
  Administered 2020-10-27: 70 ug via INTRAVENOUS
  Administered 2020-10-27: 120 ug via INTRAVENOUS
  Administered 2020-10-27: 90 ug via INTRAVENOUS
  Administered 2020-10-27: 150 ug/h via INTRAVENOUS
  Administered 2020-10-27: 50 ug via INTRAVENOUS
  Administered 2020-10-27: 100 ug via INTRAVENOUS
  Administered 2020-10-28: 110 ug via INTRAVENOUS
  Administered 2020-10-28: 60 ug via INTRAVENOUS
  Administered 2020-10-28: 70 ug via INTRAVENOUS
  Administered 2020-10-29: 0 mL via INTRAVENOUS
  Administered 2020-10-29: 80 ug via INTRAVENOUS
  Administered 2020-10-29: 10 ug via INTRAVENOUS
  Administered 2020-10-29: 60 ug via INTRAVENOUS
  Administered 2020-10-30: 20 ug via INTRAVENOUS
  Administered 2020-10-30: 10 ug via INTRAVENOUS
  Administered 2020-10-30: 30 ug via INTRAVENOUS
  Administered 2020-10-30: 20 ug via INTRAVENOUS
  Administered 2020-10-30: 10 ug via INTRAVENOUS
  Administered 2020-10-31: 50 ug via INTRAVENOUS
  Administered 2020-10-31: 40 ug via INTRAVENOUS
  Administered 2020-10-31: 20 ug via INTRAVENOUS
  Administered 2020-10-31 – 2020-11-01 (×3): 30 ug via INTRAVENOUS
  Administered 2020-11-01: 20 ug via INTRAVENOUS
  Filled 2020-10-26: qty 20

## 2020-10-26 MED ORDER — LIDOCAINE 2% (20 MG/ML) 5 ML SYRINGE
INTRAMUSCULAR | Status: DC | PRN
  Administered 2020-10-26: 100 mg via INTRAVENOUS

## 2020-10-26 MED ORDER — SODIUM CHLORIDE 0.9 % IV SOLN: INTRAVENOUS | Status: DC

## 2020-10-26 MED ORDER — VISTASEAL 10 ML SINGLE DOSE KIT
10.0000 mL | PACK | CUTANEOUS | Status: DC
  Filled 2020-10-26: qty 10

## 2020-10-26 MED ORDER — MEPERIDINE HCL 25 MG/ML IJ SOLN: 6.2500 mg | INTRAMUSCULAR | Status: DC | PRN

## 2020-10-26 MED ORDER — INSULIN ASPART 100 UNIT/ML ~~LOC~~ SOLN
0.0000 [IU] | Freq: Three times a day (TID) | SUBCUTANEOUS | Status: DC
  Administered 2020-10-27 (×2): 1 [IU] via SUBCUTANEOUS
  Administered 2020-10-30 – 2020-10-31 (×2): 2 [IU] via SUBCUTANEOUS
  Administered 2020-10-31: 3 [IU] via SUBCUTANEOUS
  Administered 2020-10-31: 2 [IU] via SUBCUTANEOUS
  Administered 2020-11-01: 3 [IU] via SUBCUTANEOUS
  Administered 2020-11-01: 2 [IU] via SUBCUTANEOUS
  Administered 2020-11-01: 1 [IU] via SUBCUTANEOUS
  Administered 2020-11-02: 2 [IU] via SUBCUTANEOUS

## 2020-10-26 MED ORDER — INSULIN ASPART 100 UNIT/ML ~~LOC~~ SOLN
0.0000 [IU] | Freq: Every day | SUBCUTANEOUS | Status: DC
  Administered 2020-10-26: 2 [IU] via SUBCUTANEOUS

## 2020-10-26 MED ORDER — VISTASEAL 10 ML SINGLE DOSE KIT
PACK | CUTANEOUS | Status: DC | PRN
  Administered 2020-10-26: 10 mL via TOPICAL

## 2020-10-26 MED ORDER — CEFAZOLIN SODIUM-DEXTROSE 2-4 GM/100ML-% IV SOLN
2.0000 g | INTRAVENOUS | Status: AC
  Administered 2020-10-26: 2 g via INTRAVENOUS
  Filled 2020-10-26: qty 100

## 2020-10-26 MED ORDER — OXYCODONE HCL 5 MG PO TABS: 5.0000 mg | ORAL_TABLET | Freq: Once | ORAL | Status: DC | PRN

## 2020-10-26 MED ORDER — FENTANYL CITRATE (PF) 100 MCG/2ML IJ SOLN
INTRAMUSCULAR | Status: DC | PRN
  Administered 2020-10-26 (×3): 50 ug via INTRAVENOUS
  Administered 2020-10-26: 100 ug via INTRAVENOUS

## 2020-10-26 MED ORDER — CHLORHEXIDINE GLUCONATE CLOTH 2 % EX PADS: 6.0000 | MEDICATED_PAD | Freq: Once | CUTANEOUS | Status: DC

## 2020-10-26 MED ORDER — ROCURONIUM BROMIDE 10 MG/ML (PF) SYRINGE
PREFILLED_SYRINGE | INTRAVENOUS | Status: DC | PRN
  Administered 2020-10-26: 100 mg via INTRAVENOUS

## 2020-10-26 MED ORDER — ACETAMINOPHEN 500 MG PO TABS
500.0000 mg | ORAL_TABLET | Freq: Four times a day (QID) | ORAL | Status: DC
  Administered 2020-10-26 – 2020-11-01 (×22): 500 mg via ORAL
  Filled 2020-10-26 (×21): qty 1

## 2020-10-26 MED ORDER — LACTATED RINGERS IV SOLN: INTRAVENOUS | Status: DC | PRN

## 2020-10-26 MED ORDER — NALOXONE HCL 0.4 MG/ML IJ SOLN: 0.4000 mg | INTRAMUSCULAR | Status: DC | PRN

## 2020-10-26 MED ORDER — RAMIPRIL 2.5 MG PO CAPS
2.5000 mg | ORAL_CAPSULE | Freq: Every day | ORAL | Status: DC
  Administered 2020-10-27 – 2020-10-28 (×2): 2.5 mg via ORAL
  Filled 2020-10-26 (×2): qty 1

## 2020-10-26 MED ORDER — PANTOPRAZOLE SODIUM 40 MG IV SOLR
40.0000 mg | INTRAVENOUS | Status: DC
  Administered 2020-10-27 – 2020-10-30 (×4): 40 mg via INTRAVENOUS
  Filled 2020-10-26 (×4): qty 40

## 2020-10-26 MED ORDER — OXYCODONE HCL 5 MG/5ML PO SOLN: 5.0000 mg | Freq: Once | ORAL | Status: DC | PRN

## 2020-10-26 MED ORDER — ALBUMIN HUMAN 5 % IV SOLN: INTRAVENOUS | Status: DC | PRN

## 2020-10-26 MED ORDER — ONDANSETRON 4 MG PO TBDP: 4.0000 mg | ORAL_TABLET | Freq: Four times a day (QID) | ORAL | Status: DC | PRN

## 2020-10-26 MED ORDER — FENTANYL CITRATE (PF) 100 MCG/2ML IJ SOLN
INTRAMUSCULAR | Status: AC
  Administered 2020-10-26: 50 ug via INTRAVENOUS
  Filled 2020-10-26: qty 2

## 2020-10-26 MED ORDER — FENTANYL 50 MCG/ML IV PCA SOLN
INTRAVENOUS | Status: AC
  Filled 2020-10-26: qty 20

## 2020-10-26 MED ORDER — LIDOCAINE HCL (PF) 2 % IJ SOLN
INTRAMUSCULAR | Status: DC | PRN
  Administered 2020-10-26: 160 mg via INTRADERMAL

## 2020-10-26 MED ORDER — MIDAZOLAM HCL 2 MG/2ML IJ SOLN
INTRAMUSCULAR | Status: AC
  Administered 2020-10-26: 2 mg via INTRAVENOUS
  Filled 2020-10-26: qty 2

## 2020-10-26 MED ORDER — 0.9 % SODIUM CHLORIDE (POUR BTL) OPTIME
TOPICAL | Status: DC | PRN
  Administered 2020-10-26 (×2): 1000 mL

## 2020-10-26 MED ORDER — ONDANSETRON HCL 4 MG/2ML IJ SOLN
INTRAMUSCULAR | Status: DC | PRN
  Administered 2020-10-26: 4 mg via INTRAVENOUS

## 2020-10-26 MED ORDER — STERILE WATER FOR IRRIGATION IR SOLN
Status: DC | PRN
  Administered 2020-10-26 (×2): 1000 mL

## 2020-10-26 MED ORDER — CHLORHEXIDINE GLUCONATE 0.12 % MT SOLN
15.0000 mL | Freq: Once | OROMUCOSAL | Status: AC
  Administered 2020-10-26: 15 mL via OROMUCOSAL
  Filled 2020-10-26: qty 15

## 2020-10-26 MED ORDER — ACETAMINOPHEN 160 MG/5ML PO SOLN: 325.0000 mg | ORAL | Status: DC | PRN

## 2020-10-26 MED ORDER — FENTANYL CITRATE (PF) 100 MCG/2ML IJ SOLN: 25.0000 ug | INTRAMUSCULAR | Status: DC | PRN

## 2020-10-26 MED ORDER — ROCURONIUM BROMIDE 10 MG/ML (PF) SYRINGE
PREFILLED_SYRINGE | INTRAVENOUS | Status: AC
  Filled 2020-10-26: qty 10

## 2020-10-26 MED ORDER — ALBUMIN HUMAN 5 % IV SOLN
INTRAVENOUS | Status: AC
  Filled 2020-10-26: qty 250

## 2020-10-26 MED ORDER — ASPIRIN EC 81 MG PO TBEC
81.0000 mg | DELAYED_RELEASE_TABLET | Freq: Every day | ORAL | Status: DC
  Administered 2020-10-27 – 2020-11-02 (×7): 81 mg via ORAL
  Filled 2020-10-26 (×7): qty 1

## 2020-10-26 MED ORDER — PHENYLEPHRINE HCL-NACL 10-0.9 MG/250ML-% IV SOLN
INTRAVENOUS | Status: DC | PRN
  Administered 2020-10-26: 50 ug/min via INTRAVENOUS

## 2020-10-26 MED ORDER — ONDANSETRON HCL 4 MG/2ML IJ SOLN: 4.0000 mg | Freq: Once | INTRAMUSCULAR | Status: DC | PRN

## 2020-10-26 MED ORDER — DEXAMETHASONE SODIUM PHOSPHATE 10 MG/ML IJ SOLN
INTRAMUSCULAR | Status: AC
  Filled 2020-10-26: qty 1

## 2020-10-26 MED ORDER — ACETAMINOPHEN 325 MG PO TABS: 325.0000 mg | ORAL_TABLET | ORAL | Status: DC | PRN

## 2020-10-26 MED ORDER — ACETAMINOPHEN 500 MG PO TABS
1000.0000 mg | ORAL_TABLET | ORAL | Status: AC
  Administered 2020-10-26: 1000 mg via ORAL
  Filled 2020-10-26: qty 2

## 2020-10-26 MED ORDER — EPHEDRINE SULFATE-NACL 50-0.9 MG/10ML-% IV SOSY
PREFILLED_SYRINGE | INTRAVENOUS | Status: DC | PRN
  Administered 2020-10-26: 5 mg via INTRAVENOUS

## 2020-10-26 MED ORDER — PROPOFOL 10 MG/ML IV BOLUS
INTRAVENOUS | Status: AC
  Filled 2020-10-26: qty 20

## 2020-10-26 MED ORDER — SODIUM CHLORIDE 0.9% IV SOLUTION: Freq: Once | INTRAVENOUS | Status: DC

## 2020-10-26 MED ORDER — SODIUM CHLORIDE 0.9% FLUSH: 9.0000 mL | INTRAVENOUS | Status: DC | PRN

## 2020-10-26 MED ORDER — ONDANSETRON HCL 4 MG/2ML IJ SOLN: 4.0000 mg | Freq: Four times a day (QID) | INTRAMUSCULAR | Status: DC | PRN

## 2020-10-26 MED ORDER — PROPOFOL 10 MG/ML IV BOLUS
INTRAVENOUS | Status: DC | PRN
  Administered 2020-10-26: 100 mg via INTRAVENOUS

## 2020-10-26 SURGICAL SUPPLY — 54 items
BIOPATCH RED 1 DISK 7.0 (GAUZE/BANDAGES/DRESSINGS) ×2 IMPLANT
BLADE SURG 10 STRL SS (BLADE) ×2 IMPLANT
CHLORAPREP W/TINT 26 (MISCELLANEOUS) ×2 IMPLANT
CLIP VESOCCLUDE LG 6/CT (CLIP) ×2 IMPLANT
CLIP VESOCCLUDE MED 24/CT (CLIP) ×2 IMPLANT
CLIP VESOLOCK LG 6/CT PURPLE (CLIP) ×2 IMPLANT
CLIP VESOLOCK MED 6/CT (CLIP) ×2 IMPLANT
CLIP VESOLOCK MED LG 6/CT (CLIP) ×2 IMPLANT
CNTNR URN SCR LID CUP LEK RST (MISCELLANEOUS) ×1 IMPLANT
CONT SPEC 4OZ STRL OR WHT (MISCELLANEOUS) ×2
COVER SURGICAL LIGHT HANDLE (MISCELLANEOUS) ×2 IMPLANT
DRAIN CHANNEL 19F RND (DRAIN) ×2 IMPLANT
DRAPE WARM FLUID 44X44 (DRAPES) ×2 IMPLANT
DRSG COVADERM 4X10 (GAUZE/BANDAGES/DRESSINGS) ×2 IMPLANT
DRSG TEGADERM 4X4.75 (GAUZE/BANDAGES/DRESSINGS) ×2 IMPLANT
DRSG TELFA 3X8 NADH (GAUZE/BANDAGES/DRESSINGS) ×2 IMPLANT
ELECT BLADE 6.5 EXT (BLADE) ×2 IMPLANT
ELECT CAUTERY BLADE 6.4 (BLADE) ×2 IMPLANT
GAUZE 4X4 16PLY RFD (DISPOSABLE) ×2 IMPLANT
GLOVE BIO SURGEON STRL SZ 6 (GLOVE) ×4 IMPLANT
GLOVE INDICATOR 6.5 STRL GRN (GLOVE) ×2 IMPLANT
GLOVE SURG ORTHO 7.0 STRL STRW (GLOVE) ×4 IMPLANT
GOWN STRL REUS W/ TWL LRG LVL3 (GOWN DISPOSABLE) ×2 IMPLANT
GOWN STRL REUS W/TWL 2XL LVL3 (GOWN DISPOSABLE) ×6 IMPLANT
GOWN STRL REUS W/TWL LRG LVL3 (GOWN DISPOSABLE) ×4
HANDLE SUCTION POOLE (INSTRUMENTS) ×1 IMPLANT
HEMOSTAT HEMOBLAST BELLOWS (HEMOSTASIS) IMPLANT
HEMOSTAT SNOW SURGICEL 2X4 (HEMOSTASIS) IMPLANT
KIT BASIN OR (CUSTOM PROCEDURE TRAY) ×2 IMPLANT
KIT TURNOVER KIT B (KITS) ×2 IMPLANT
PACK GENERAL/GYN (CUSTOM PROCEDURE TRAY) ×2 IMPLANT
PENCIL SMOKE EVACUATOR (MISCELLANEOUS) ×2 IMPLANT
RELOAD STAPLE TA45 3.5 REG BLU (ENDOMECHANICALS) IMPLANT
RELOAD STAPLER LINEAR PROX 30 (STAPLE) ×1 IMPLANT
SHEARS FOC LG CVD HARMONIC 17C (MISCELLANEOUS) ×2 IMPLANT
SHEARS HARMONIC ACE PLUS 36CM (ENDOMECHANICALS) IMPLANT
SLEEVE SUCTION 125 (MISCELLANEOUS) IMPLANT
SLEEVE SUCTION CATH 165 (SLEEVE) ×2 IMPLANT
SPONGE LAP 18X18 RF (DISPOSABLE) ×8 IMPLANT
STAPLER RELOAD LINEAR PROX 30 (STAPLE) ×2
STAPLER VISISTAT 35W (STAPLE) ×2 IMPLANT
SUCTION POOLE HANDLE (INSTRUMENTS) ×2
SUT ETHILON 2 0 FS 18 (SUTURE) ×2 IMPLANT
SUT PDS AB 1 TP1 96 (SUTURE) ×4 IMPLANT
SUT PROLENE 2 0 SH 30 (SUTURE) ×2 IMPLANT
SUT PROLENE 3 0 SH 48 (SUTURE) ×4 IMPLANT
SUT PROLENE 4 0 RB 1 (SUTURE) ×2
SUT PROLENE 4-0 RB1 .5 CRCL 36 (SUTURE) ×1 IMPLANT
SUT SILK 2 0SH CR/8 30 (SUTURE) ×2 IMPLANT
SUT SILK 3 0SH CR/8 30 (SUTURE) ×2 IMPLANT
SUT VIC AB 3-0 SH 27 (SUTURE) ×2
SUT VIC AB 3-0 SH 27X BRD (SUTURE) ×1 IMPLANT
SYR BULB IRRIG 60ML STRL (SYRINGE) ×2 IMPLANT
TRAY FOLEY MTR SLVR 16FR STAT (SET/KITS/TRAYS/PACK) ×2 IMPLANT

## 2020-10-26 NOTE — Interval H&P Note (Signed)
History and Physical Interval Note:  10/26/2020 7:52 AM  Ethan Mcneil  has presented today for surgery, with the diagnosis of IPMN PANCREAS.  The various methods of treatment have been discussed with the patient and family. After consideration of risks, benefits and other options for treatment, the patient has consented to  Procedure(s): OPEN DISTAL PANCREATECTOMY, SPLENECTOMY (N/A) as a surgical intervention.  The patient's history has been reviewed, patient examined, no change in status, stable for surgery.  I have reviewed the patient's chart and labs.  Questions were answered to the patient's satisfaction.     Stark Klein

## 2020-10-26 NOTE — Anesthesia Postprocedure Evaluation (Signed)
Anesthesia Post Note  Patient: RAFEL GARDE  Procedure(s) Performed: OPEN DISTAL PANCREATECTOMY (N/A Abdomen) SPLENECTOMY (N/A Abdomen)     Patient location during evaluation: PACU Anesthesia Type: Epidural and General Level of consciousness: awake and alert Pain management: pain level controlled Vital Signs Assessment: post-procedure vital signs reviewed and stable Respiratory status: spontaneous breathing, nonlabored ventilation, respiratory function stable and patient connected to nasal cannula oxygen Cardiovascular status: blood pressure returned to baseline and stable Postop Assessment: no apparent nausea or vomiting Anesthetic complications: no   No complications documented.  Last Vitals:  Vitals:   10/26/20 2025 10/26/20 2026  BP: (!) 150/72   Pulse: 85   Resp: 17 20  Temp: 36.6 C   SpO2: 100% 99%    Last Pain:  Vitals:   10/26/20 2026  TempSrc:   PainSc: 4                  Marylouise Mallet

## 2020-10-26 NOTE — Anesthesia Procedure Notes (Signed)
Epidural Patient location during procedure: pre-op Start time: 10/26/2020 8:45 AM End time: 10/26/2020 9:01 AM  Staffing Anesthesiologist: Janeece Riggers, MD  Preanesthetic Checklist Completed: patient identified, IV checked, site marked, risks and benefits discussed, surgical consent, monitors and equipment checked, pre-op evaluation and timeout performed  Epidural Patient position: sitting Prep: DuraPrep and site prepped and draped Patient monitoring: continuous pulse ox and blood pressure Approach: midline Location: thoracic (1-12) Injection technique: LOR air  Needle:  Needle type: Tuohy  Needle gauge: 17 G Needle length: 9 cm and 9 Needle insertion depth: 5 cm cm Catheter type: closed end flexible Catheter size: 19 Gauge Catheter at skin depth: 10 cm Test dose: negative  Assessment Events: blood not aspirated, injection not painful, no injection resistance, no paresthesia and negative IV test

## 2020-10-26 NOTE — Anesthesia Procedure Notes (Signed)
Arterial Line Insertion Start/End11/10/2020 9:05 AM, 10/26/2020 9:10 AM Performed by: Renato Shin, CRNA, CRNA  Patient location: Pre-op. Preanesthetic checklist: patient identified, IV checked, site marked, risks and benefits discussed, surgical consent, monitors and equipment checked, pre-op evaluation, timeout performed and anesthesia consent Lidocaine 1% used for infiltration Right, radial was placed Catheter size: 20 G Hand hygiene performed , maximum sterile barriers used  and Seldinger technique used Allen's test indicative of satisfactory collateral circulation Attempts: 1 Procedure performed without using ultrasound guided technique. Following insertion, dressing applied and Biopatch. Post procedure assessment: normal  Patient tolerated the procedure well with no immediate complications.

## 2020-10-26 NOTE — Anesthesia Procedure Notes (Signed)
Procedure Name: Intubation Date/Time: 10/26/2020 9:55 AM Performed by: Leonor Liv, CRNA Pre-anesthesia Checklist: Patient identified, Emergency Drugs available, Suction available and Patient being monitored Patient Re-evaluated:Patient Re-evaluated prior to induction Oxygen Delivery Method: Circle System Utilized Preoxygenation: Pre-oxygenation with 100% oxygen Induction Type: IV induction Ventilation: Mask ventilation without difficulty Laryngoscope Size: Mac and 4 Grade View: Grade I Tube type: Oral Tube size: 8.0 mm Number of attempts: 1 Airway Equipment and Method: Stylet and Oral airway Placement Confirmation: ETT inserted through vocal cords under direct vision,  positive ETCO2 and breath sounds checked- equal and bilateral Secured at: 23 cm Tube secured with: Tape Dental Injury: Teeth and Oropharynx as per pre-operative assessment

## 2020-10-26 NOTE — Transfer of Care (Signed)
Immediate Anesthesia Transfer of Care Note  Patient: Ethan Mcneil  Procedure(s) Performed: OPEN DISTAL PANCREATECTOMY (N/A Abdomen) SPLENECTOMY (N/A Abdomen)  Patient Location: PACU  Anesthesia Type:General  Level of Consciousness: awake, alert  and oriented  Airway & Oxygen Therapy: Patient Spontanous Breathing and Patient connected to nasal cannula oxygen  Post-op Assessment: Report given to RN, Post -op Vital signs reviewed and stable and Patient moving all extremities  Post vital signs: Reviewed and stable  Last Vitals:  Vitals Value Taken Time  BP 117/59 10/26/20 1308  Temp    Pulse 56 10/26/20 1310  Resp 12 10/26/20 1310  SpO2 94 % 10/26/20 1310  Vitals shown include unvalidated device data.  Last Pain:  Vitals:   10/26/20 1256  TempSrc:   PainSc: Asleep         Complications: No complications documented.

## 2020-10-26 NOTE — Op Note (Signed)
PRE-OPERATIVE DIAGNOSIS: IPMN pancreas  POST-OPERATIVE DIAGNOSIS:  Same  PROCEDURE:  Procedure(s): Open distal subtotal pancreatectomy and splenectomy  SURGEON:  Surgeon(s): Stark Klein, MD  ASSISTANT: Sheria Lang, MD, PGY7  ANESTHESIA:   general  DRAINS: (65 Fr) Blake drain(s) in the LUQ   LOCAL MEDICATIONS USED:  NONE  SPECIMEN:  Source of Specimen:  liver nodule from left lateral segment  2. Distal pancreas, spleen and omentum 3. Left gastric lymph node 4. Final pancreatic duct margin  DISPOSITION OF SPECIMEN:  PATHOLOGY  COUNTS:  YES  DICTATION: .Dragon Dictation  PLAN OF CARE: Admit to inpatient   PATIENT DISPOSITION:  PACU - hemodynamically stable.  FINDINGS:  Mixed solid/cystic mass with mucin in the pancreatic body, relatively firm pancreatic tissue  EBL: 200 mL  PROCEDURE:  Patient was identified in the holding area where he was taken to the OR and placed supine on the operating room table.  General anesthesia was induced.  A Foley catheter was placed and active warming was placed on the patient.  Sequential compression devices were placed on the calves prior to induction.  The patient's arms were tucked and padded appropriately.  The patient's abdomen was then prepped and draped in sterile fashion.  A timeout was performed according to the surgical safety checklist.  When all was correct, we continued.  A vertical midline incision was made in the upper abdomen.  The subcutaneous tissues were divided with the cautery.  The fascia was entered sharply in the midline.  The fascial incision was opened the length of the midline incision which was just below the umbilicus.  The Bookwalter was set up to assist with visualization.  There was a small liver nodule that was taken with the harmonic and sent for frozen.  This was negative for malignancy.  The lesser sac was opened between the stomach and the colon.  The stomach was retracted superiorly and this was opened with  a combination of cautery and the harmonic scalpel.  The pancreas was identified and the mass was palpated.  The short gastrics were then taken down with the harmonic.  The stomach was retracted medially.  The spleen was dissected away from its attachments to the diaphragm and the retroperitoneum.  It was mobilized medially.  There was a relatively long hilum of the spleen from the distal pancreas and so the spleen was removed by tying off the splenic vessels and passing the spleen off.  A segment of omentum was slightly devascularized and so this was taken off in place with the specimen.  The peritoneum was then opened underneath the pancreas.  The pancreas was reflected superiorly.  This dissection was carried out around the tip of the pancreas and then proximally.  The mass did have several cystic areas posteriorly.  This was carefully dissected until we got to the pancreas proximal to the mass where it appeared normal.  The cystic artery remnant was dissected away from the pancreas and suture ligated.  The distal portion of the splenic artery remnant was left en bloc with the pancreas.  The pancreas was then divided with the TA stapler around 1 cm proximal to the mass.  This location was over the splenoportal confluence.  The pancreatic duct was marked with a 4-0 Prolene.  This did retract a bit and the margin appeared closer with the pancreas removed.  The pancreas distal to the staple line was removed sharply and passed off as a final margin.  The staple line was then oversewn  with a running locking 2-0 Prolene suture.  The left upper quadrant was examined for hemostasis.  Stomach was examined and had no sign of injury.  The abdomen was irrigated with water and saline.  Vistaseal was placed over the pancreatic stump.  A drain was placed in the left abdomen looping up into the left upper quadrant with the tip near the pancreatic stump.  This was secured with a 2-0 nylon and connected to a gravity bag  drain.  The lap count was then performed and was correct.  The fascia was closed using running #1 looped PDS suture.  Care was taken to protect the underlying viscera.  The knot was secured down with 3-0 Vicryl.  The skin was irrigated and closed with skin staples.  The abdomen was then cleaned, dried, and dressed with soft sterile dressings.  The patient was allowed to emerge from anesthesia and taken to the PACU in stable condition.  Needle, sponge, and instrument counts were correct x2.

## 2020-10-27 ENCOUNTER — Encounter (HOSPITAL_COMMUNITY): Payer: Self-pay | Admitting: General Surgery

## 2020-10-27 LAB — CBC
HCT: 26.8 % — ABNORMAL LOW (ref 39.0–52.0)
Hemoglobin: 8.6 g/dL — ABNORMAL LOW (ref 13.0–17.0)
MCH: 30.7 pg (ref 26.0–34.0)
MCHC: 32.1 g/dL (ref 30.0–36.0)
MCV: 95.7 fL (ref 80.0–100.0)
Platelets: 117 10*3/uL — ABNORMAL LOW (ref 150–400)
RBC: 2.8 MIL/uL — ABNORMAL LOW (ref 4.22–5.81)
RDW: 14.6 % (ref 11.5–15.5)
WBC: 12.6 10*3/uL — ABNORMAL HIGH (ref 4.0–10.5)
nRBC: 0 % (ref 0.0–0.2)

## 2020-10-27 LAB — COMPREHENSIVE METABOLIC PANEL
ALT: 13 U/L (ref 0–44)
AST: 22 U/L (ref 15–41)
Albumin: 2.8 g/dL — ABNORMAL LOW (ref 3.5–5.0)
Alkaline Phosphatase: 34 U/L — ABNORMAL LOW (ref 38–126)
Anion gap: 6 (ref 5–15)
BUN: 13 mg/dL (ref 8–23)
CO2: 23 mmol/L (ref 22–32)
Calcium: 7.9 mg/dL — ABNORMAL LOW (ref 8.9–10.3)
Chloride: 108 mmol/L (ref 98–111)
Creatinine, Ser: 1.84 mg/dL — ABNORMAL HIGH (ref 0.61–1.24)
GFR, Estimated: 36 mL/min — ABNORMAL LOW (ref >60)
Glucose, Bld: 166 mg/dL — ABNORMAL HIGH (ref 70–99)
Potassium: 4.6 mmol/L (ref 3.5–5.1)
Sodium: 137 mmol/L (ref 135–145)
Total Bilirubin: 1 mg/dL (ref 0.3–1.2)
Total Protein: 4.5 g/dL — ABNORMAL LOW (ref 6.5–8.1)

## 2020-10-27 LAB — GLUCOSE, CAPILLARY
Glucose-Capillary: 105 mg/dL — ABNORMAL HIGH (ref 70–99)
Glucose-Capillary: 135 mg/dL — ABNORMAL HIGH (ref 70–99)
Glucose-Capillary: 143 mg/dL — ABNORMAL HIGH (ref 70–99)
Glucose-Capillary: 130 mg/dL — ABNORMAL HIGH (ref 70–99)

## 2020-10-27 LAB — MAGNESIUM: Magnesium: 1.4 mg/dL — ABNORMAL LOW (ref 1.7–2.4)

## 2020-10-27 MED ORDER — LACTATED RINGERS IV BOLUS: 500.0000 mL | Freq: Once | INTRAVENOUS | Status: AC

## 2020-10-27 MED ORDER — HEPARIN SODIUM (PORCINE) 5000 UNIT/ML IJ SOLN
5000.0000 [IU] | Freq: Three times a day (TID) | INTRAMUSCULAR | Status: DC
  Administered 2020-10-27 – 2020-11-02 (×20): 5000 [IU] via SUBCUTANEOUS
  Filled 2020-10-27 (×20): qty 1

## 2020-10-27 MED ORDER — CHLORHEXIDINE GLUCONATE CLOTH 2 % EX PADS
6.0000 | MEDICATED_PAD | Freq: Every day | CUTANEOUS | Status: DC
  Administered 2020-10-27 – 2020-11-02 (×6): 6 via TOPICAL

## 2020-10-27 MED ORDER — MAGNESIUM SULFATE 4 GM/100ML IV SOLN
4.0000 g | Freq: Once | INTRAVENOUS | Status: AC
  Administered 2020-10-27: 4 g via INTRAVENOUS
  Filled 2020-10-27: qty 100

## 2020-10-27 MED ORDER — GLUCERNA SHAKE PO LIQD
237.0000 mL | Freq: Three times a day (TID) | ORAL | Status: DC
  Administered 2020-10-27 – 2020-11-02 (×18): 237 mL via ORAL

## 2020-10-27 NOTE — Addendum Note (Signed)
Addendum  created 10/27/20 1623 by Freddrick March, MD   Clinical Note Signed

## 2020-10-27 NOTE — Plan of Care (Signed)
Patient son at the bedside this afternoon and patient and nurse updated son on patient progress.  Patient sat on the side of the bed and stood x2 with PT. Patient informed this Probation officer that helped with pain control.  Patient continues to be on pain control with Epidural and PCA pump of Dilaudid. Tolerating small amt of clear liquid.  Problem: Education: Goal: Knowledge of General Education information will improve Description: Including pain rating scale, medication(s)/side effects and non-pharmacologic comfort measures Outcome: Progressing   Problem: Health Behavior/Discharge Planning: Goal: Ability to manage health-related needs will improve Outcome: Progressing   Problem: Clinical Measurements: Goal: Ability to maintain clinical measurements within normal limits will improve Outcome: Progressing Goal: Will remain free from infection Outcome: Progressing Goal: Diagnostic test results will improve Outcome: Progressing Goal: Respiratory complications will improve Outcome: Progressing Goal: Cardiovascular complication will be avoided Outcome: Progressing   Problem: Activity: Goal: Risk for activity intolerance will decrease Outcome: Progressing   Problem: Nutrition: Goal: Adequate nutrition will be maintained Outcome: Progressing   Problem: Coping: Goal: Level of anxiety will decrease Outcome: Progressing   Problem: Elimination: Goal: Will not experience complications related to bowel motility Outcome: Progressing Goal: Will not experience complications related to urinary retention Outcome: Progressing   Problem: Pain Managment: Goal: General experience of comfort will improve Outcome: Progressing   Problem: Safety: Goal: Ability to remain free from injury will improve Outcome: Progressing   Problem: Skin Integrity: Goal: Risk for impaired skin integrity will decrease Outcome: Progressing   Problem: Education: Goal: Required Educational Video(s) Outcome:  Progressing   Problem: Clinical Measurements: Goal: Postoperative complications will be avoided or minimized Outcome: Progressing   Problem: Skin Integrity: Goal: Demonstration of wound healing without infection will improve Outcome: Progressing

## 2020-10-27 NOTE — Evaluation (Signed)
Physical Therapy Evaluation Patient Details Name: Ethan Mcneil MRN: [id number] DOB: [birth year] Today's Date: 10/27/2020   History of Present Illness  Patient is an 86 y/o male with PMH which includes pancreatic insufficiency/chronic pancreatitis, CABG 1997, CAD, PVD, diabetes, HTN, cardiac cath 2009. 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  Clinical Impression  PTA, patient was independent with mobility and lives with wife. Patient required modA for supine>sit with HOB elevated for trunk elevation and minA for sit>supine with assist with B LE advancement onto bed. Patient performed sit to stand x2 with minA initially and min guard for second trial with RW. Patient limited this session by increased pain. Patient demonstrates decreased B LE strength, impaired balance, decreased activity tolerance. Patient will benefit from skilled PT services during acute stay to address listed deficits. Anticipate no PT follow up at this time.     Follow Up Recommendations No PT follow up;Supervision for mobility/OOB    Equipment Recommendations  Rolling [name] with 5" wheels;3in1 (PT)    Recommendations for Other Services       Precautions / Restrictions Precautions Precautions: Fall Precaution Comments: abdominal incision, drain Restrictions Weight Bearing Restrictions: No      Mobility  Bed Mobility Overal bed mobility: Needs Assistance Bed Mobility: Supine to Sit;Sit to Supine     Supine to sit: Mod assist;HOB elevated Sit to supine: Min assist   General bed mobility comments: modA for supine>sit for trunk elevation. MinA for sit>supine for B LE advancement onto bed    Transfers Overall transfer level: Needs assistance Equipment used: Rolling Glora Hulgan (2 wheeled) Transfers: Sit to/from Stand Sit to Stand: Min assist         General transfer comment: sit to stand x 2 with second trial requiring min guard  Ambulation/Gait                 Stairs            Wheelchair Mobility    Modified Rankin (Stroke Patients Only)       Balance Overall balance assessment: Needs assistance Sitting-balance support: Bilateral upper extremity supported;Feet supported Sitting balance-[name]: Fair     Standing balance support: Bilateral upper extremity supported;During functional activity Standing balance-[name]: Poor Standing balance comment: reliant on UE support                             Pertinent Vitals/Pain Pain Assessment: Faces Faces Pain Scale: Hurts whole lot Pain Location: abdomen Pain Descriptors / Indicators: Aching;Grimacing;Guarding;Operative site guarding Pain Intervention(s): Limited activity within patient's tolerance;Monitored during session    [place] expects to be discharged to:: Private residence Living Arrangements: Spouse/significant other Available Help at Discharge: Family;Available 24 hours/day Type of Home: House Home Access: Stairs to enter Entrance Stairs-Rails: None Entrance Stairs-Number of Steps: 1 Home Layout: One level Home Equipment: [name];Shower seat      Prior Function Level of Independence: Independent               Hand Dominance        Extremity/Trunk Assessment   Upper Extremity Assessment Upper Extremity Assessment: Overall WFL for tasks assessed    Lower Extremity Assessment Lower Extremity Assessment: Generalized weakness       Communication   Communication: No difficulties  Cognition Arousal/Alertness: Awake/alert Behavior During Therapy: WFL for tasks assessed/performed Overall Cognitive Status: Within Functional Limits for tasks assessed  General Comments      Exercises     Assessment/Plan    PT Assessment Patient needs continued PT services  PT Problem List Decreased strength;Decreased activity tolerance;Decreased  balance;Decreased mobility;Pain       PT Treatment Interventions Gait training;DME instruction;Stair training;Functional mobility training;Therapeutic activities;Therapeutic exercise;Balance training;Patient/family education    PT Goals (Current goals can be found in the Care Plan section)  Acute Rehab PT Goals Patient Stated Goal: to go home PT Goal Formulation: With patient Time For Goal Achievement: 11/10/20 Potential to Achieve Goals: Good    Frequency Min 3X/week   Barriers to discharge        Co-evaluation               AM-PAC PT "6 Clicks" Mobility  Outcome Measure Help needed turning from your back to your side while in a flat bed without using bedrails?: A Lot Help needed moving from lying on your back to sitting on the side of a flat bed without using bedrails?: A Lot Help needed moving to and from a bed to a chair (including a wheelchair)?: A Little Help needed standing up from a chair using your arms (e.g., wheelchair or bedside chair)?: A Little Help needed to walk in hospital room?: A Little Help needed climbing 3-5 steps with a railing? : A Little 6 Click Score: 16    End of Session Equipment Utilized During Treatment: Oxygen (2L [place]) Activity Tolerance: Patient limited by pain Patient left: in bed;with call bell/phone within reach;with bed alarm set Nurse Communication: Mobility status PT Visit Diagnosis: Unsteadiness on feet (R26.81);Other abnormalities of gait and mobility (R26.89);Muscle weakness (generalized) (M62.81);Pain Pain - part of body:  (abdomen)    Time: 5997-7414 PT Time Calculation (min) (ACUTE ONLY): 34 min   Charges:   PT Evaluation $PT Eval Moderate Complexity: 1 Mod PT Treatments $Therapeutic Activity: 8-22 mins        Perrin Maltese, PT, DPT Acute Rehabilitation Services Pager [phone] Office [phone]   Melene Plan Allred 10/27/2020, 3:26 PM

## 2020-10-27 NOTE — Progress Notes (Signed)
1 Day Post-Op s/p open distal pancreatectomy  Subjective/Chief Complaint: - No acute events overnight - Abdominal pain poorly controlled - Denies nausea or vomiting - Tolerating low volume clear liquids well - Has not yet ambulated - Denies flatus/BM   Objective: Vital signs in last 24 hours: Temp:  [97.8 F (36.6 C)-98.7 F (37.1 C)] 98 F (36.7 C) (11/12 0416) Pulse Rate:  [46-85] 80 (11/12 0416) Resp:  [0-20] 16 (11/12 0416) BP: (95-201)/(45-78) 129/69 (11/12 0416) SpO2:  [97 %-100 %] 99 % (11/12 0416) Arterial Line BP: (78-187)/(33-73) 78/34 (11/11 1430) Last BM Date: 10/26/20  Intake/Output from previous day: 11/11 0701 - 11/12 0700 In: 4815.5 [P.O.:330; I.V.:3262.3; Blood:315; IV Piggyback:750] Out: 6283 [Urine:220; Drains:785; Blood:250] Intake/Output this shift: No intake/output data recorded.  General appearance: alert, cooperative and no distress Resp: No increased work of breathing on 2L [place] Cardio: regular rate and rhythm GI: soft, tender to palpation, non-distended, no rebound, drain without sersanguinous output in bag Male genitalia: Foley catheter in place Neurologic: Grossly normal Incision/Wound: Incisional dressings in place with a small amount of serosanguinous staining  Lab Results:  Recent Labs    10/26/20 1300 10/27/20 0026  WBC 13.4* 12.6*  HGB 9.0* 8.6*  HCT 28.0* 26.8*  PLT 119* 117*   BMET Recent Labs    10/26/20 1300 10/27/20 0026  NA 137 137  K 4.8 4.6  CL 110 108  CO2 20* 23  GLUCOSE 163* 166*  BUN 12 13  CREATININE 1.60* 1.84*  CALCIUM 7.3* 7.9*   PT/INR No results for input(s): LABPROT, INR in the last 72 hours. ABG Recent Labs    10/26/20 1201  PHART 7.386  HCO3 21.3    Studies/Results: No results found.  Anti-infectives: Anti-infectives (From admission, onward)   Start     Dose/Rate Route Frequency Ordered Stop   10/26/20 0715  ceFAZolin (ANCEF) IVPB 2g/100 mL premix        2 g 200 mL/hr over 30 Minutes  Intravenous On call to O.R. 10/26/20 0713 10/26/20 0944      Assessment/Plan: s/p Procedure(s): OPEN DISTAL PANCREATECTOMY (N/A) SPLENECTOMY (N/A)  - Advance to Full liquid diet with nutritional supplements - Pain regimen/epidural management per APS - Keep Foley catheter, abdominal drain, epidural - OOB - PT   LOS: 1 day   Sheria Lang  General Surgery  10/27/2020

## 2020-10-27 NOTE — Anesthesia Post-op Follow-up Note (Signed)
  Anesthesia Pain Follow-up Note  Patient: Ethan Mcneil  Day #: 1  Date of Follow-up: 10/27/2020 Time: 4:22 PM  Last Vitals:  Vitals:   10/27/20 1233 10/27/20 1439  BP:  (!) 124/57  Pulse:  60  Resp: 13 18  Temp:  36.7 C  SpO2: 99% 100%    Level of Consciousness: alert  Pain: mild   Side Effects:None  Catheter Site Exam:clean, dry, no drainage  Anti-Coag Meds (From admission, onward)   Start     Dose/Rate Route Frequency Ordered Stop   10/27/20 0830  heparin injection 5,000 Units        5,000 Units Subcutaneous Every 8 hours 10/27/20 0735      Epidural / Intrathecal (From admission, onward)   Start     Dose/Rate Route Frequency Ordered Stop   10/26/20 1245  ropivacaine (PF) 2 mg/mL (0.2%) (NAROPIN) injection        10 mL/hr 10 mL/hr  Epidural Continuous 10/26/20 1238 10/31/20 1244       Plan: Pt reports pain is well controlled. Continue current therapy of postop epidural at surgeon's request  [place]

## 2020-10-28 LAB — COMPREHENSIVE METABOLIC PANEL
ALT: 12 U/L (ref 0–44)
AST: 20 U/L (ref 15–41)
Albumin: 2.6 g/dL — ABNORMAL LOW (ref 3.5–5.0)
Alkaline Phosphatase: 41 U/L (ref 38–126)
Anion gap: 6 (ref 5–15)
BUN: 13 mg/dL (ref 8–23)
CO2: 25 mmol/L (ref 22–32)
Calcium: 7.9 mg/dL — ABNORMAL LOW (ref 8.9–10.3)
Chloride: 103 mmol/L (ref 98–111)
Creatinine, Ser: 1.73 mg/dL — ABNORMAL HIGH (ref 0.61–1.24)
GFR, Estimated: 38 mL/min — ABNORMAL LOW (ref >60)
Glucose, Bld: 140 mg/dL — ABNORMAL HIGH (ref 70–99)
Potassium: 4.8 mmol/L (ref 3.5–5.1)
Sodium: 134 mmol/L — ABNORMAL LOW (ref 135–145)
Total Bilirubin: 0.5 mg/dL (ref 0.3–1.2)
Total Protein: 4.5 g/dL — ABNORMAL LOW (ref 6.5–8.1)

## 2020-10-28 LAB — CBC
HCT: 28.8 % — ABNORMAL LOW (ref 39.0–52.0)
Hemoglobin: 9.2 g/dL — ABNORMAL LOW (ref 13.0–17.0)
MCH: 31.1 pg (ref 26.0–34.0)
MCHC: 31.9 g/dL (ref 30.0–36.0)
MCV: 97.3 fL (ref 80.0–100.0)
Platelets: 125 10*3/uL — ABNORMAL LOW (ref 150–400)
RBC: 2.96 MIL/uL — ABNORMAL LOW (ref 4.22–5.81)
RDW: 14.6 % (ref 11.5–15.5)
WBC: 11.5 10*3/uL — ABNORMAL HIGH (ref 4.0–10.5)
nRBC: 0 % (ref 0.0–0.2)

## 2020-10-28 LAB — MAGNESIUM: Magnesium: 2.1 mg/dL (ref 1.7–2.4)

## 2020-10-28 LAB — GLUCOSE, CAPILLARY
Glucose-Capillary: 86 mg/dL (ref 70–99)
Glucose-Capillary: 86 mg/dL (ref 70–99)
Glucose-Capillary: 92 mg/dL (ref 70–99)
Glucose-Capillary: 103 mg/dL — ABNORMAL HIGH (ref 70–99)

## 2020-10-28 MED ORDER — RAMIPRIL 5 MG PO CAPS
5.0000 mg | ORAL_CAPSULE | Freq: Every day | ORAL | Status: DC
  Administered 2020-10-29 – 2020-11-02 (×5): 5 mg via ORAL
  Filled 2020-10-28 (×5): qty 1

## 2020-10-28 MED ORDER — HYDRALAZINE HCL 25 MG PO TABS
25.0000 mg | ORAL_TABLET | Freq: Four times a day (QID) | ORAL | Status: DC | PRN
  Administered 2020-10-28 – 2020-11-01 (×4): 25 mg via ORAL
  Filled 2020-10-28 (×4): qty 1

## 2020-10-28 NOTE — Progress Notes (Signed)
Patient ID: Ethan Mcneil, male   DOB: 1934/07/23, 86 y.o.   MRN: [id number] [facility] Progress Note:   2 Days Post-Op  Subjective: Mental status is alert .  Complaints not hungry to advance diet. Objective: Vital signs in last 24 hours: Temp:  [98.1 F (36.7 C)-99.2 F (37.3 C)] 98.4 F (36.9 C) (11/13 0504) Pulse Rate:  [49-118] 56 (11/13 0931) Resp:  [10-18] 18 (11/13 0940) BP: (111-176)/(55-63) 176/62 (11/13 0931) SpO2:  [92 %-100 %] 96 % (11/13 0940)  Intake/Output from previous day: 11/12 0701 - 11/13 0700 In: 2661.9 [P.O.:402; I.V.:2089] Out: 0630 [Urine:1275; Drains:195] Intake/Output this shift: No intake/output data recorded.  Physical Exam: Work of breathing is normal;  JP with serosanguinous drainage  Lab Results:  Results for orders placed or performed during the hospital encounter of 10/26/20 (from the past 48 hour(s))  I-STAT 7, (LYTES, BLD GAS, ICA, H+H)     Status: Abnormal   Collection Time: 10/26/20 12:01 PM  Result Value Ref Range   pH, Arterial 7.386 7.35 - 7.45   pCO2 arterial 34.6 32 - 48 mmHg   pO2, Arterial 266 (H) 83 - 108 mmHg   Bicarbonate 21.3 20.0 - 28.0 mmol/L   TCO2 22 22 - 32 mmol/L   O2 Saturation 100.0 %   Acid-base deficit 4.0 (H) 0.0 - 2.0 mmol/L   Sodium 138 135 - 145 mmol/L   Potassium 4.7 3.5 - 5.1 mmol/L   Calcium, Ion 1.07 (L) 1.15 - 1.40 mmol/L   HCT 23.0 (L) 39 - 52 %   Hemoglobin 7.8 (L) 13.0 - 17.0 g/dL   Patient temperature 34.4 C    Sample type ARTERIAL   Prepare RBC (crossmatch)     Status: None   Collection Time: 10/26/20 12:05 PM  Result Value Ref Range   Order Confirmation      BB SAMPLE OR UNITS ALREADY AVAILABLE Performed at [facility], [address] [place], [place] [address]   CBC     Status: Abnormal   Collection Time: 10/26/20  1:00 PM  Result Value Ref Range   WBC 13.4 (H) 4.0 - 10.5 K/uL   RBC 2.88 (L) 4.22 - 5.81 MIL/uL   Hemoglobin 9.0 (L) 13.0 - 17.0 g/dL   HCT 28.0 (L) 39 -  52 %   MCV 97.2 80.0 - 100.0 fL   MCH 31.3 26.0 - 34.0 pg   MCHC 32.1 30.0 - 36.0 g/dL   RDW 14.5 11.5 - 15.5 %   Platelets 119 (L) 150 - 400 K/uL    Comment: Immature Platelet Fraction may be clinically indicated, consider ordering this additional test [id number]    nRBC 0.0 0.0 - 0.2 %    Comment: Performed at [facility], [place] [address], [place], [place] [address]  Basic metabolic panel     Status: Abnormal   Collection Time: 10/26/20  1:00 PM  Result Value Ref Range   Sodium 137 135 - 145 mmol/L   Potassium 4.8 3.5 - 5.1 mmol/L   Chloride 110 98 - 111 mmol/L   CO2 20 (L) 22 - 32 mmol/L   Glucose, Bld 163 (H) 70 - 99 mg/dL    Comment: Glucose reference range applies only to samples taken after fasting for at least 8 hours.   BUN 12 8 - 23 mg/dL   Creatinine, Ser 1.60 (H) 0.61 - 1.24 mg/dL   Calcium 7.3 (L) 8.9 - 10.3 mg/dL   GFR, Estimated 42 (  L) >60 mL/min    Comment: (NOTE) Calculated using the CKD-EPI Creatinine Equation (2021)    Anion gap 7 5 - 15    Comment: Performed at [facility], [place] [address], [place], [place] [address]  Glucose, capillary     Status: Abnormal   Collection Time: 10/26/20  4:06 PM  Result Value Ref Range   Glucose-Capillary 150 (H) 70 - 99 mg/dL    Comment: Glucose reference range applies only to samples taken after fasting for at least 8 hours.  Glucose, capillary     Status: Abnormal   Collection Time: 10/26/20  9:04 PM  Result Value Ref Range   Glucose-Capillary 205 (H) 70 - 99 mg/dL    Comment: Glucose reference range applies only to samples taken after fasting for at least 8 hours.  CBC     Status: Abnormal   Collection Time: 10/27/20 12:26 AM  Result Value Ref Range   WBC 12.6 (H) 4.0 - 10.5 K/uL   RBC 2.80 (L) 4.22 - 5.81 MIL/uL   Hemoglobin 8.6 (L) 13.0 - 17.0 g/dL   HCT 26.8 (L) 39 - 52 %   MCV 95.7 80.0 - 100.0 fL   MCH 30.7 26.0 - 34.0 pg   MCHC 32.1 30.0 - 36.0 g/dL   RDW 14.6 11.5 - 15.5 %   Platelets 117 (L)  150 - 400 K/uL    Comment: REPEATED TO VERIFY SPECIMEN CHECKED FOR CLOTS Immature Platelet Fraction may be clinically indicated, consider ordering this additional test [id number] CONSISTENT WITH PREVIOUS RESULT    nRBC 0.0 0.0 - 0.2 %    Comment: Performed at [facility], [place] [address], [place], [place] [address]  Comprehensive metabolic panel     Status: Abnormal   Collection Time: 10/27/20 12:26 AM  Result Value Ref Range   Sodium 137 135 - 145 mmol/L   Potassium 4.6 3.5 - 5.1 mmol/L   Chloride 108 98 - 111 mmol/L   CO2 23 22 - 32 mmol/L   Glucose, Bld 166 (H) 70 - 99 mg/dL    Comment: Glucose reference range applies only to samples taken after fasting for at least 8 hours.   BUN 13 8 - 23 mg/dL   Creatinine, Ser 1.84 (H) 0.61 - 1.24 mg/dL   Calcium 7.9 (L) 8.9 - 10.3 mg/dL   Total Protein 4.5 (L) 6.5 - 8.1 g/dL   Albumin 2.8 (L) 3.5 - 5.0 g/dL   AST 22 15 - 41 U/L   ALT 13 0 - 44 U/L   Alkaline Phosphatase 34 (L) 38 - 126 U/L   Total Bilirubin 1.0 0.3 - 1.2 mg/dL   GFR, Estimated 36 (L) >60 mL/min    Comment: (NOTE) Calculated using the CKD-EPI Creatinine Equation (2021)    Anion gap 6 5 - 15    Comment: Performed at [facility], [place] [address], [place], [place] [address]  Magnesium     Status: Abnormal   Collection Time: 10/27/20 12:26 AM  Result Value Ref Range   Magnesium 1.4 (L) 1.7 - 2.4 mg/dL    Comment: Performed at [place] [address], [place],  [address]  Glucose, capillary     Status: Abnormal   Collection Time: 10/27/20  8:19 AM  Result Value Ref Range   Glucose-Capillary 105 (H) 70 - 99 mg/dL    Comment: Glucose reference range applies only to samples taken after fasting for at least 8 hours.  Glucose, capillary     Status: Abnormal   Collection Time: 10/27/20 11:53 AM  Result Value Ref Range   Glucose-Capillary 135 (H) 70 - 99 mg/dL    Comment: Glucose reference range applies only to samples taken after fasting for at  least 8 hours.  Glucose, capillary     Status: Abnormal   Collection Time: 10/27/20  5:01 PM  Result Value Ref Range   Glucose-Capillary 143 (H) 70 - 99 mg/dL    Comment: Glucose reference range applies only to samples taken after fasting for at least 8 hours.  Glucose, capillary     Status: Abnormal   Collection Time: 10/27/20  8:53 PM  Result Value Ref Range   Glucose-Capillary 130 (H) 70 - 99 mg/dL    Comment: Glucose reference range applies only to samples taken after fasting for at least 8 hours.  CBC     Status: Abnormal   Collection Time: 10/28/20  1:02 AM  Result Value Ref Range   WBC 11.5 (H) 4.0 - 10.5 K/uL   RBC 2.96 (L) 4.22 - 5.81 MIL/uL   Hemoglobin 9.2 (L) 13.0 - 17.0 g/dL   HCT 28.8 (L) 39 - 52 %   MCV 97.3 80.0 - 100.0 fL   MCH 31.1 26.0 - 34.0 pg   MCHC 31.9 30.0 - 36.0 g/dL   RDW 14.6 11.5 - 15.5 %   Platelets 125 (L) 150 - 400 K/uL   nRBC 0.0 0.0 - 0.2 %    Comment: Performed at [facility] [address] [place], [place] [address]  Comprehensive metabolic panel     Status: Abnormal   Collection Time: 10/28/20  1:02 AM  Result Value Ref Range   Sodium 134 (L) 135 - 145 mmol/L   Potassium 4.8 3.5 - 5.1 mmol/L   Chloride 103 98 - 111 mmol/L   CO2 25 22 - 32 mmol/L   Glucose, Bld 140 (H) 70 - 99 mg/dL    Comment: Glucose reference range applies only to samples taken after fasting for at least 8 hours.   BUN 13 8 - 23 mg/dL   Creatinine, Ser 1.73 (H) 0.61 - 1.24 mg/dL   Calcium 7.9 (L) 8.9 - 10.3 mg/dL   Total Protein 4.5 (L) 6.5 - 8.1 g/dL   Albumin 2.6 (L) 3.5 - 5.0 g/dL   AST 20 15 - 41 U/L   ALT 12 0 - 44 U/L   Alkaline Phosphatase 41 38 - 126 U/L   Total Bilirubin 0.5 0.3 - 1.2 mg/dL   GFR, Estimated 38 (L) >60 mL/min    Comment: (NOTE) Calculated using the CKD-EPI Creatinine Equation (2021)    Anion gap 6 5 - 15    Comment: Performed at [facility], [place] [address], [place], [place] [address]  Magnesium     Status: None    Collection Time: 10/28/20  1:02 AM  Result Value Ref Range   Magnesium 2.1 1.7 - 2.4 mg/dL    Comment: Performed at [facility] [address] [place], [place] [address]  Glucose, capillary     Status: None   Collection Time: 10/28/20  8:17 AM  Result Value Ref Range   Glucose-Capillary 86 70 - 99 mg/dL    Comment: Glucose reference range applies only to samples taken after fasting for at least 8 hours.    Radiology/Results: No results found.  Anti-infectives: Anti-infectives (From admission, onward)   Start     Dose/Rate  Route Frequency Ordered Stop   10/26/20 0715  ceFAZolin (ANCEF) IVPB 2g/100 mL premix        2 g 200 mL/hr over 30 Minutes Intravenous On call to O.R. 10/26/20 0713 10/26/20 0944      Assessment/Plan: Problem List: Patient Active Problem List   Diagnosis Date Noted  . IPMN (intraductal papillary mucinous neoplasm) 10/23/2020  . Pancreatic mass 05/22/2020  . Pancreatic insufficiency 03/17/2017  . PCP NOTES >>>> 11/26/2015  . Vitamin D deficiency 04/14/2015  . Loss of weight-- fatigue 01/13/2015  . Urolithiasis 10/10/2014  . Vitamin B 12 deficiency 10/10/2014  . Anemia 10/10/2014  . Annual physical exam 03/07/2014  . 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 Hypothyroidism 04/21/2012  . DJD (degenerative joint disease) 04/21/2012  . HEPATIC CYST 10/03/2010  . BRADYCARDIA 05/02/2009  . PVD 05/02/2009  . HYPERPLASIA PROSTATE UNS W/O UR OBST & OTH LUTS 12/30/2008  . DM (diabetes mellitus) type II uncontrolled, periph vascular disorder ([place]) 07/04/2008  . HYPERLIPIDEMIA 11/03/2007  . HTN (hypertension) 11/03/2007  . CAD (coronary artery disease) 11/03/2007  . POSTSURGICAL AORTOCORONARY BYPASS STATUS 10/30/2007  . HERNIORRHAPHY, HX OF 10/30/2007    Appetite coming back but slowly.  Stable. 2 Days Post-Op    LOS: 2 days   Matt B. Hassell Done, MD, [facility] [phone] to reach the surgeon on call.    10/28/2020 10:17  AM

## 2020-10-28 NOTE — Progress Notes (Signed)
   10/28/20 1805  Assess: MEWS Score  BP (!) 209/78  Pulse Rate 74  Resp 18  Level of Consciousness Alert  SpO2 94 %  O2 Device Room Air  Assess: MEWS Score  MEWS Temp 0  MEWS Systolic 2  MEWS Pulse 0  MEWS RR 0  MEWS LOC 0  MEWS Score 2  MEWS Score Color Yellow  Assess: if the MEWS score is Yellow or Red  Were vital signs taken at a resting state? Yes  Focused Assessment Change from prior assessment (see assessment flowsheet)  Early Detection of Sepsis Score *See Row Information* Low  MEWS guidelines implemented *See Row Information* Yes  Treat  MEWS Interventions Escalated (See documentation below)  Pain Scale 0-10  Pain Score 10  Pain Location Abdomen  Pain Orientation Mid  Pain Descriptors / Indicators Sore  Pain Frequency Intermittent  Take Vital Signs  Increase Vital Sign Frequency  Yellow: Q 2hr X 2 then Q 4hr X 2, if remains yellow, continue Q 4hrs  Escalate  MEWS: Escalate Yellow: discuss with charge nurse/RN and consider discussing with provider and RRT  Notify: Charge Nurse/RN  Name of Charge Nurse/RN Notified Thomasene Lot RN  Date Charge Nurse/RN Notified 10/28/20  Time Charge Nurse/RN Notified 1802  Notify: Provider  Provider Name/Title Marye Round   Date Provider Notified 10/28/20  Time Provider Notified 1805  Notification Type Page (and secured chat)  Notification Reason Change in status  Response Other (Comment) (awaiting for response)

## 2020-10-28 NOTE — Progress Notes (Deleted)
   10/28/20 1805  Assess: MEWS Score  BP (!) 209/78  Pulse Rate 74  Resp 18  Level of Consciousness Alert  SpO2 94 %  O2 Device Room Air  Assess: MEWS Score  MEWS Temp 0  MEWS Systolic 2  MEWS Pulse 0  MEWS RR 0  MEWS LOC 0  MEWS Score 2  MEWS Score Color Yellow  Assess: if the MEWS score is Yellow or Red  Were vital signs taken at a resting state? Yes  Focused Assessment Change from prior assessment (see assessment flowsheet)  Early Detection of Sepsis Score *See Row Information* Low  MEWS guidelines implemented *See Row Information* Yes  Treat  MEWS Interventions Escalated (See documentation below)  Pain Scale 0-10  Pain Score 10  Pain Location Abdomen  Pain Orientation Mid  Pain Descriptors / Indicators Sore  Pain Frequency Intermittent  Take Vital Signs  Increase Vital Sign Frequency  Yellow: Q 2hr X 2 then Q 4hr X 2, if remains yellow, continue Q 4hrs  Escalate  MEWS: Escalate Yellow: discuss with charge nurse/RN and consider discussing with provider and RRT  Notify: Charge Nurse/RN  Name of Charge Nurse/RN Notified Thomasene Lot RN  Date Charge Nurse/RN Notified 10/28/20  Time Charge Nurse/RN Notified 1802  Notify: Provider  Provider Name/Title Marye Round   Date Provider Notified 10/28/20  Time Provider Notified 1805  Notification Type Page (and secured chat)  Notification Reason Change in status  Response Other (Comment) (awaiting for response)

## 2020-10-28 NOTE — Progress Notes (Signed)
B/P rechecked at 1920  manually  B/P is 160/78/71

## 2020-10-28 NOTE — Progress Notes (Signed)
   10/28/20 2311  Vitals  Temp 98.2 F (36.8 C)  Temp Source Oral  BP (!) 125/58  MAP (mmHg) 77  Pulse Rate (!) 49  Pulse Rate Source Monitor  Resp 16  MEWS COLOR  MEWS Score Color Green  Oxygen Therapy  SpO2 96 %  O2 Device Room Air  MEWS Score  MEWS Temp 0  MEWS Systolic 0  MEWS Pulse 1  MEWS RR 0  MEWS LOC 0  MEWS Score 1  Provider Notification  Provider Name/Title Stark Klein  Date Provider Notified 10/28/20  Time Provider Notified 2343  Notification Type Page  Notification Reason Other (Comment) (bradycardia)  Response No new orders  Date of Provider Response 10/28/20  Time of Provider Response 1661

## 2020-10-28 NOTE — Progress Notes (Signed)
   10/28/20 1818  Notify: Provider  Date of Provider Response 10/28/20  Time of Provider Response 1818  Hydralazine 25 mg ordered and given will recheck for effectiveness.

## 2020-10-29 LAB — MAGNESIUM: Magnesium: 2 mg/dL (ref 1.7–2.4)

## 2020-10-29 LAB — CBC
HCT: 28.6 % — ABNORMAL LOW (ref 39.0–52.0)
Hemoglobin: 9.2 g/dL — ABNORMAL LOW (ref 13.0–17.0)
MCH: 30.7 pg (ref 26.0–34.0)
MCHC: 32.2 g/dL (ref 30.0–36.0)
MCV: 95.3 fL (ref 80.0–100.0)
Platelets: 144 10*3/uL — ABNORMAL LOW (ref 150–400)
RBC: 3 MIL/uL — ABNORMAL LOW (ref 4.22–5.81)
RDW: 14.3 % (ref 11.5–15.5)
WBC: 9.9 10*3/uL (ref 4.0–10.5)
nRBC: 0 % (ref 0.0–0.2)

## 2020-10-29 LAB — GLUCOSE, CAPILLARY
Glucose-Capillary: 65 mg/dL — ABNORMAL LOW (ref 70–99)
Glucose-Capillary: 91 mg/dL (ref 70–99)
Glucose-Capillary: 78 mg/dL (ref 70–99)
Glucose-Capillary: 94 mg/dL (ref 70–99)
Glucose-Capillary: 122 mg/dL — ABNORMAL HIGH (ref 70–99)

## 2020-10-29 LAB — COMPREHENSIVE METABOLIC PANEL
ALT: 13 U/L (ref 0–44)
AST: 18 U/L (ref 15–41)
Albumin: 2.4 g/dL — ABNORMAL LOW (ref 3.5–5.0)
Alkaline Phosphatase: 40 U/L (ref 38–126)
Anion gap: 5 (ref 5–15)
BUN: 13 mg/dL (ref 8–23)
CO2: 27 mmol/L (ref 22–32)
Calcium: 8 mg/dL — ABNORMAL LOW (ref 8.9–10.3)
Chloride: 103 mmol/L (ref 98–111)
Creatinine, Ser: 1.72 mg/dL — ABNORMAL HIGH (ref 0.61–1.24)
GFR, Estimated: 39 mL/min — ABNORMAL LOW (ref >60)
Glucose, Bld: 101 mg/dL — ABNORMAL HIGH (ref 70–99)
Potassium: 4.7 mmol/L (ref 3.5–5.1)
Sodium: 135 mmol/L (ref 135–145)
Total Bilirubin: 0.6 mg/dL (ref 0.3–1.2)
Total Protein: 4.5 g/dL — ABNORMAL LOW (ref 6.5–8.1)

## 2020-10-29 MED ORDER — INFLUENZA VAC A&B SA ADJ QUAD 0.5 ML IM PRSY
0.5000 mL | PREFILLED_SYRINGE | INTRAMUSCULAR | Status: AC
  Administered 2020-10-30: 0.5 mL via INTRAMUSCULAR
  Filled 2020-10-29: qty 0.5

## 2020-10-29 NOTE — Progress Notes (Signed)
Patient ID: Ethan Mcneil, male   DOB: 1933-12-29, 86 y.o.   MRN: [id number] [facility] Progress Note:   3 Days Post-Op  Subjective: Mental status is clear and appropriate.  Complaints not really hungry for solids. Objective: Vital signs in last 24 hours: Temp:  [97.9 F (36.6 C)-98.7 F (37.1 C)] 98.3 F (36.8 C) (11/14 0813) Pulse Rate:  [47-74] 61 (11/14 0813) Resp:  [12-18] 16 (11/14 0813) BP: (125-209)/(56-79) 150/78 (11/14 0813) SpO2:  [93 %-100 %] 100 % (11/14 0813)  Intake/Output from previous day: 11/13 0701 - 11/14 0700 In: 1991.5 [P.O.:120; I.V.:1791.5] Out: 2550 [Urine:2400; Drains:150] Intake/Output this shift: No intake/output data recorded.  Physical Exam: Work of breathing is normal.  Abdomen exam appropriately sore.   Lab Results:  Results for orders placed or performed during the hospital encounter of 10/26/20 (from the past 48 hour(s))  Glucose, capillary     Status: Abnormal   Collection Time: 10/27/20 11:53 AM  Result Value Ref Range   Glucose-Capillary 135 (H) 70 - 99 mg/dL    Comment: Glucose reference range applies only to samples taken after fasting for at least 8 hours.  Glucose, capillary     Status: Abnormal   Collection Time: 10/27/20  5:01 PM  Result Value Ref Range   Glucose-Capillary 143 (H) 70 - 99 mg/dL    Comment: Glucose reference range applies only to samples taken after fasting for at least 8 hours.  Glucose, capillary     Status: Abnormal   Collection Time: 10/27/20  8:53 PM  Result Value Ref Range   Glucose-Capillary 130 (H) 70 - 99 mg/dL    Comment: Glucose reference range applies only to samples taken after fasting for at least 8 hours.  CBC     Status: Abnormal   Collection Time: 10/28/20  1:02 AM  Result Value Ref Range   WBC 11.5 (H) 4.0 - 10.5 K/uL   RBC 2.96 (L) 4.22 - 5.81 MIL/uL   Hemoglobin 9.2 (L) 13.0 - 17.0 g/dL   HCT 28.8 (L) 39 - 52 %   MCV 97.3 80.0 - 100.0 fL   MCH 31.1 26.0 - 34.0 pg   MCHC 31.9  30.0 - 36.0 g/dL   RDW 14.6 11.5 - 15.5 %   Platelets 125 (L) 150 - 400 K/uL   nRBC 0.0 0.0 - 0.2 %    Comment: Performed at [facility] [address] [place], [place] [address]  Comprehensive metabolic panel     Status: Abnormal   Collection Time: 10/28/20  1:02 AM  Result Value Ref Range   Sodium 134 (L) 135 - 145 mmol/L   Potassium 4.8 3.5 - 5.1 mmol/L   Chloride 103 98 - 111 mmol/L   CO2 25 22 - 32 mmol/L   Glucose, Bld 140 (H) 70 - 99 mg/dL    Comment: Glucose reference range applies only to samples taken after fasting for at least 8 hours.   BUN 13 8 - 23 mg/dL   Creatinine, Ser 1.73 (H) 0.61 - 1.24 mg/dL   Calcium 7.9 (L) 8.9 - 10.3 mg/dL   Total Protein 4.5 (L) 6.5 - 8.1 g/dL   Albumin 2.6 (L) 3.5 - 5.0 g/dL   AST 20 15 - 41 U/L   ALT 12 0 - 44 U/L   Alkaline Phosphatase 41 38 - 126 U/L   Total Bilirubin 0.5 0.3 - 1.2 mg/dL   GFR, Estimated 38 (L) >60 mL/min    Comment: (  NOTE) Calculated using the CKD-EPI Creatinine Equation (2021)    Anion gap 6 5 - 15    Comment: Performed at [facility], [place] [address], [place], [place] [address]  Magnesium     Status: None   Collection Time: 10/28/20  1:02 AM  Result Value Ref Range   Magnesium 2.1 1.7 - 2.4 mg/dL    Comment: Performed at [facility] [address] [place], [place] [address]  Glucose, capillary     Status: None   Collection Time: 10/28/20  8:17 AM  Result Value Ref Range   Glucose-Capillary 86 70 - 99 mg/dL    Comment: Glucose reference range applies only to samples taken after fasting for at least 8 hours.  Glucose, capillary     Status: None   Collection Time: 10/28/20 12:29 PM  Result Value Ref Range   Glucose-Capillary 86 70 - 99 mg/dL    Comment: Glucose reference range applies only to samples taken after fasting for at least 8 hours.  Glucose, capillary     Status: None   Collection Time: 10/28/20  5:54 PM  Result Value Ref Range   Glucose-Capillary 92 70 - 99 mg/dL     Comment: Glucose reference range applies only to samples taken after fasting for at least 8 hours.  Glucose, capillary     Status: Abnormal   Collection Time: 10/28/20  8:37 PM  Result Value Ref Range   Glucose-Capillary 103 (H) 70 - 99 mg/dL    Comment: Glucose reference range applies only to samples taken after fasting for at least 8 hours.  CBC     Status: Abnormal   Collection Time: 10/29/20  3:07 AM  Result Value Ref Range   WBC 9.9 4.0 - 10.5 K/uL   RBC 3.00 (L) 4.22 - 5.81 MIL/uL   Hemoglobin 9.2 (L) 13.0 - 17.0 g/dL   HCT 28.6 (L) 39 - 52 %   MCV 95.3 80.0 - 100.0 fL   MCH 30.7 26.0 - 34.0 pg   MCHC 32.2 30.0 - 36.0 g/dL   RDW 14.3 11.5 - 15.5 %   Platelets 144 (L) 150 - 400 K/uL   nRBC 0.0 0.0 - 0.2 %    Comment: Performed at [facility] [address] [place],  [address]  Comprehensive metabolic panel     Status: Abnormal   Collection Time: 10/29/20  3:07 AM  Result Value Ref Range   Sodium 135 135 - 145 mmol/L   Potassium 4.7 3.5 - 5.1 mmol/L   Chloride 103 98 - 111 mmol/L   CO2 27 22 - 32 mmol/L   Glucose, Bld 101 (H) 70 - 99 mg/dL    Comment: Glucose reference range applies only to samples taken after fasting for at least 8 hours.   BUN 13 8 - 23 mg/dL   Creatinine, Ser 1.72 (H) 0.61 - 1.24 mg/dL   Calcium 8.0 (L) 8.9 - 10.3 mg/dL   Total Protein 4.5 (L) 6.5 - 8.1 g/dL   Albumin 2.4 (L) 3.5 - 5.0 g/dL   AST 18 15 - 41 U/L   ALT 13 0 - 44 U/L   Alkaline Phosphatase 40 38 - 126 U/L   Total Bilirubin 0.6 0.3 - 1.2 mg/dL   GFR, Estimated 39 (L) >60 mL/min    Comment: (NOTE) Calculated using the CKD-EPI Creatinine Equation (2021)    Anion gap 5 5 - 15    Comment: Performed at [facility], [place] [address] [place], [place] [address]  Magnesium     Status: None   Collection Time: 10/29/20  3:07 AM  Result Value Ref Range   Magnesium 2.0 1.7 - 2.4 mg/dL    Comment: Performed at [facility] [address], [place], [place] [address]   Glucose, capillary     Status: Abnormal   Collection Time: 10/29/20  8:19 AM  Result Value Ref Range   Glucose-Capillary 65 (L) 70 - 99 mg/dL    Comment: Glucose reference range applies only to samples taken after fasting for at least 8 hours.    Radiology/Results: No results found.  Anti-infectives: Anti-infectives (From admission, onward)   Start     Dose/Rate Route Frequency Ordered Stop   10/26/20 0715  ceFAZolin (ANCEF) IVPB 2g/100 mL premix        2 g 200 mL/hr over 30 Minutes Intravenous On call to O.R. 10/26/20 0713 10/26/20 0944      Assessment/Plan: Problem List: Patient Active Problem List   Diagnosis Date Noted  . IPMN (intraductal papillary mucinous neoplasm) 10/23/2020  . Pancreatic mass 05/22/2020  . Pancreatic insufficiency 03/17/2017  . PCP NOTES >>>> 11/26/2015  . Vitamin D deficiency 04/14/2015  . Loss of weight-- fatigue 01/13/2015  . Urolithiasis 10/10/2014  . Vitamin B 12 deficiency 10/10/2014  . Anemia 10/10/2014  . Annual physical exam 03/07/2014  . 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 Hypothyroidism 04/21/2012  . DJD (degenerative joint disease) 04/21/2012  . HEPATIC CYST 10/03/2010  . BRADYCARDIA 05/02/2009  . PVD 05/02/2009  . HYPERPLASIA PROSTATE UNS W/O UR OBST & OTH LUTS 12/30/2008  . DM (diabetes mellitus) type II uncontrolled, periph vascular disorder ([place]) 07/04/2008  . HYPERLIPIDEMIA 11/03/2007  . HTN (hypertension) 11/03/2007  . CAD (coronary artery disease) 11/03/2007  . POSTSURGICAL AORTOCORONARY BYPASS STATUS 10/30/2007  . HERNIORRHAPHY, HX OF 10/30/2007    He wants to stay on liquids.  Path pending.   3 Days Post-Op    LOS: 3 days   Matt B. Hassell Done, MD, [facility] [phone] to reach the surgeon on call.    10/29/2020 9:27 AM

## 2020-10-29 NOTE — Addendum Note (Signed)
Addendum  created 10/29/20 1819 by Belinda Block, MD   Clinical Note Signed

## 2020-10-29 NOTE — Anesthesia Post-op Follow-up Note (Signed)
  Anesthesia Pain Follow-up Note  Patient: KAION TISDALE  Day #: 4  Date of Follow-up: 10/29/2020 Time: 6:19 PM  Last Vitals:  Vitals:   10/29/20 1552 10/29/20 1600  BP: (!) 162/68   Pulse: (!) 48   Resp: 14 16  Temp: 36.8 C   SpO2: 96% 93%    Level of Consciousness: alert  Pain: mild   Side Effects:None  Catheter Site Exam:clean  Anti-Coag Meds (From admission, onward)   Start     Dose/Rate Route Frequency Ordered Stop   10/27/20 0830  heparin injection 5,000 Units        5,000 Units Subcutaneous Every 8 hours 10/27/20 0735      Epidural / Intrathecal (From admission, onward)   Start     Dose/Rate Route Frequency Ordered Stop   10/26/20 1245  ropivacaine (PF) 2 mg/mL (0.2%) (NAROPIN) injection        10 mL/hr 10 mL/hr  Epidural Continuous 10/26/20 1238 10/31/20 1244       Plan: Continue current therapy of postop epidural at surgeon's request  Jakyria Bleau

## 2020-10-30 LAB — COMPREHENSIVE METABOLIC PANEL
ALT: 12 U/L (ref 0–44)
AST: 16 U/L (ref 15–41)
Albumin: 2.3 g/dL — ABNORMAL LOW (ref 3.5–5.0)
Alkaline Phosphatase: 44 U/L (ref 38–126)
Anion gap: 8 (ref 5–15)
BUN: 12 mg/dL (ref 8–23)
CO2: 28 mmol/L (ref 22–32)
Calcium: 8 mg/dL — ABNORMAL LOW (ref 8.9–10.3)
Chloride: 100 mmol/L (ref 98–111)
Creatinine, Ser: 1.6 mg/dL — ABNORMAL HIGH (ref 0.61–1.24)
GFR, Estimated: 42 mL/min — ABNORMAL LOW (ref >60)
Glucose, Bld: 109 mg/dL — ABNORMAL HIGH (ref 70–99)
Potassium: 4.2 mmol/L (ref 3.5–5.1)
Sodium: 136 mmol/L (ref 135–145)
Total Bilirubin: 0.5 mg/dL (ref 0.3–1.2)
Total Protein: 4.5 g/dL — ABNORMAL LOW (ref 6.5–8.1)

## 2020-10-30 LAB — CBC
HCT: 28.8 % — ABNORMAL LOW (ref 39.0–52.0)
Hemoglobin: 9.3 g/dL — ABNORMAL LOW (ref 13.0–17.0)
MCH: 30.7 pg (ref 26.0–34.0)
MCHC: 32.3 g/dL (ref 30.0–36.0)
MCV: 95 fL (ref 80.0–100.0)
Platelets: 163 10*3/uL (ref 150–400)
RBC: 3.03 MIL/uL — ABNORMAL LOW (ref 4.22–5.81)
RDW: 14.1 % (ref 11.5–15.5)
WBC: 9.8 10*3/uL (ref 4.0–10.5)
nRBC: 0 % (ref 0.0–0.2)

## 2020-10-30 LAB — TYPE AND SCREEN
ABO/RH(D): O POS
Antibody Screen: NEGATIVE
Unit division: 0
Unit division: 0

## 2020-10-30 LAB — MAGNESIUM: Magnesium: 1.8 mg/dL (ref 1.7–2.4)

## 2020-10-30 LAB — BPAM RBC
Blood Product Expiration Date: 202112102359
Blood Product Expiration Date: 202112112359
ISSUE DATE / TIME: 202111111207
ISSUE DATE / TIME: 202111111218
Unit Type and Rh: 5100
Unit Type and Rh: 5100

## 2020-10-30 LAB — GLUCOSE, CAPILLARY
Glucose-Capillary: 90 mg/dL (ref 70–99)
Glucose-Capillary: 166 mg/dL — ABNORMAL HIGH (ref 70–99)
Glucose-Capillary: 154 mg/dL — ABNORMAL HIGH (ref 70–99)

## 2020-10-30 MED ORDER — PANTOPRAZOLE SODIUM 40 MG PO TBEC: 40.0000 mg | DELAYED_RELEASE_TABLET | Freq: Every day | ORAL | Status: DC

## 2020-10-30 MED ORDER — POTASSIUM CHLORIDE IN NACL 20-0.45 MEQ/L-% IV SOLN
INTRAVENOUS | Status: DC
  Filled 2020-10-30 (×3): qty 1000

## 2020-10-30 MED ORDER — PANTOPRAZOLE SODIUM 40 MG PO TBEC
40.0000 mg | DELAYED_RELEASE_TABLET | Freq: Every day | ORAL | Status: DC
  Administered 2020-10-31 – 2020-11-02 (×3): 40 mg via ORAL
  Filled 2020-10-30 (×3): qty 1

## 2020-10-30 NOTE — Anesthesia Post-op Follow-up Note (Signed)
  Anesthesia Pain Follow-up Note  Patient: BRODEN HOLT  Day #: 5  Date of Follow-up: 10/30/2020 Time: 11:20 AM  Last Vitals:  Vitals:   10/30/20 1027 10/30/20 1059  BP: (!) 168/76   Pulse: 60 64  Resp: 14   Temp: 36.7 C   SpO2: 96%     Level of Consciousness: alert  Pain: mild   Side Effects:None  Catheter Site Exam:clean, dry, no drainage  Anti-Coag Meds (From admission, onward)   Start     Dose/Rate Route Frequency Ordered Stop   10/27/20 0830  heparin injection 5,000 Units        5,000 Units Subcutaneous Every 8 hours 10/27/20 0735      Epidural / Intrathecal (From admission, onward)   Start     Dose/Rate Route Frequency Ordered Stop   10/26/20 1245  ropivacaine (PF) 2 mg/mL (0.2%) (NAROPIN) injection        10 mL/hr 10 mL/hr  Epidural Continuous 10/26/20 1238 10/31/20 1244       Plan: Continue current therapy of postop epidural at surgeon's request  Merlinda Frederick

## 2020-10-30 NOTE — Addendum Note (Signed)
Addendum  created 10/30/20 1121 by Merlinda Frederick, MD   Clinical Note Signed

## 2020-10-30 NOTE — Plan of Care (Signed)
Patient ambulated in hall this afternoon and sat in chair for 2 hours. Patient foley catheter removed. Patient increased appetite. Pain management improved.   Problem: Education: Goal: Knowledge of General Education information will improve Description: Including pain rating scale, medication(s)/side effects and non-pharmacologic comfort measures 10/30/2020 2536 by Kem Kays, RN Outcome: Progressing 10/30/2020 6440 by Kem Kays, RN Outcome: Progressing   Problem: Health Behavior/Discharge Planning: Goal: Ability to manage health-related needs will improve 10/30/2020 3474 by Kem Kays, RN Outcome: Progressing 10/30/2020 2595 by Kem Kays, RN Outcome: Progressing   Problem: Clinical Measurements: Goal: Ability to maintain clinical measurements within normal limits will improve 10/30/2020 6387 by Kem Kays, RN Outcome: Progressing 10/30/2020 5643 by Kem Kays, RN Outcome: Progressing Goal: Will remain free from infection 10/30/2020 3295 by Kem Kays, RN Outcome: Progressing 10/30/2020 1884 by Kem Kays, RN Outcome: Progressing Goal: Diagnostic test results will improve 10/30/2020 1660 by Kem Kays, RN Outcome: Progressing 10/30/2020 6301 by Kem Kays, RN Outcome: Progressing Goal: Respiratory complications will improve 10/30/2020 6010 by Kem Kays, RN Outcome: Progressing 10/30/2020 9323 by Kem Kays, RN Outcome: Progressing Goal: Cardiovascular complication will be avoided 10/30/2020 5573 by Kem Kays, RN Outcome: Progressing 10/30/2020 2202 by Kem Kays, RN Outcome: Progressing   Problem: Activity: Goal: Risk for activity intolerance will decrease 10/30/2020 5427 by Kem Kays, RN Outcome: Progressing 10/30/2020 0623 by Kem Kays, RN Outcome: Progressing   Problem: Nutrition: Goal: Adequate nutrition will be maintained 10/30/2020 7628 by Kem Kays,  RN Outcome: Progressing 10/30/2020 3151 by Kem Kays, RN Outcome: Progressing   Problem: Coping: Goal: Level of anxiety will decrease 10/30/2020 7616 by Kem Kays, RN Outcome: Progressing 10/30/2020 0737 by Kem Kays, RN Outcome: Progressing   Problem: Elimination: Goal: Will not experience complications related to bowel motility 10/30/2020 1062 by Kem Kays, RN Outcome: Progressing 10/30/2020 6948 by Kem Kays, RN Outcome: Progressing Goal: Will not experience complications related to urinary retention 10/30/2020 5462 by Kem Kays, RN Outcome: Progressing 10/30/2020 7035 by Kem Kays, RN Outcome: Progressing   Problem: Pain Managment: Goal: General experience of comfort will improve 10/30/2020 0093 by Kem Kays, RN Outcome: Progressing 10/30/2020 8182 by Kem Kays, RN Outcome: Progressing   Problem: Safety: Goal: Ability to remain free from injury will improve 10/30/2020 9937 by Kem Kays, RN Outcome: Progressing 10/30/2020 1696 by Kem Kays, RN Outcome: Progressing   Problem: Skin Integrity: Goal: Risk for impaired skin integrity will decrease 10/30/2020 7893 by Kem Kays, RN Outcome: Progressing 10/30/2020 8101 by Kem Kays, RN Outcome: Progressing   Problem: Education: Goal: Required Educational Video(s) 10/30/2020 7510 by Kem Kays, RN Outcome: Progressing 10/30/2020 2585 by Kem Kays, RN Outcome: Progressing   Problem: Clinical Measurements: Goal: Postoperative complications will be avoided or minimized 10/30/2020 2778 by Kem Kays, RN Outcome: Progressing 10/30/2020 2423 by Kem Kays, RN Outcome: Progressing   Problem: Skin Integrity: Goal: Demonstration of wound healing without infection will improve 10/30/2020 5361 by Kem Kays, RN Outcome: Progressing 10/30/2020 4431 by Kem Kays, RN Outcome: Progressing

## 2020-10-30 NOTE — Care Management Important Message (Signed)
Important Message  Patient Details  Name: Ethan Mcneil MRN: [id number] Date of Birth: Jun 11, 1934   Medicare Important Message Given:  Yes     Orbie Pyo 10/30/2020, 4:25 PM

## 2020-10-30 NOTE — Progress Notes (Signed)
Physical Therapy Treatment Patient Details Name: Ethan Mcneil MRN: [id number] DOB: 31-Aug-1934 Today's Date: 10/30/2020    History of Present Illness Patient is an 86 y/o male with PMH which includes pancreatic insufficiency/chronic pancreatitis, CABG 1997, CAD, PVD, diabetes, HTN, cardiac cath 2009. 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    PT Comments    Patient progressing towards physical therapy goals. Session focused on B LE strengthening. Patient performed functional exercises in sitting and standing. Patient able to perform bed mobility with min guard for safety, demos log roll to limit stress on abdominal incision. Patient required min guard for safety for sit to stand transfer with RW, cues for hand placement. Asked patient if he wanted to sit in recliner, patient declined and states he would like to later on today. Anticipate no PT follow up following discharge.   Follow Up Recommendations  No PT follow up;Supervision for mobility/OOB     Equipment Recommendations  Rolling Briley Sulton with 5" wheels;3in1 (PT)    Recommendations for Other Services       Precautions / Restrictions Precautions Precautions: Fall Precaution Comments: abdominal incision, drain, epidural Restrictions Weight Bearing Restrictions: No    Mobility  Bed Mobility Overal bed mobility: Needs Assistance Bed Mobility: Supine to Sit;Sit to Supine     Supine to sit: Min guard;HOB elevated Sit to supine: Min guard   General bed mobility comments: min guard for safety  Transfers Overall transfer level: Needs assistance Equipment used: Rolling Alick Lecomte (2 wheeled) Transfers: Sit to/from Stand Sit to Stand: Min guard            Ambulation/Gait                 Stairs             Wheelchair Mobility    Modified Rankin (Stroke Patients Only)       Balance Overall balance assessment: Needs assistance Sitting-balance support: Bilateral  upper extremity supported;Feet supported Sitting balance-[name]: Fair     Standing balance support: Bilateral upper extremity supported;During functional activity Standing balance-[name]: Poor Standing balance comment: reliant on UE support                            Cognition Arousal/Alertness: Awake/alert Behavior During Therapy: WFL for tasks assessed/performed Overall Cognitive Status: Within Functional Limits for tasks assessed                                        Exercises General Exercises - Lower Extremity Long Arc Quad: AROM;Both;10 reps;Seated Hip Flexion/Marching: AROM;Both;10 reps;Seated;Standing (x10 in standing; x 10 seated) Mini-Sqauts: 10 reps Other Exercises Other Exercises: sit to stand 2 x 5 reps    General Comments        Pertinent Vitals/Pain Pain Assessment: 0-10 Pain Score: 2  Pain Location: abdomen Pain Descriptors / Indicators: Aching;Grimacing;Guarding;Operative site guarding Pain Intervention(s): Limited activity within patient's tolerance;Monitored during session    Home Living                      Prior Function            PT Goals (current goals can now be found in the care plan section) Acute Rehab PT Goals Patient Stated Goal: to go home PT Goal Formulation: With patient Time For Goal Achievement: 11/10/20 Potential  to Achieve Goals: Good Progress towards PT goals: Progressing toward goals    Frequency    Min 3X/week      PT Plan Current plan remains appropriate    Co-evaluation              AM-PAC PT "6 Clicks" Mobility   Outcome Measure  Help needed turning from your back to your side while in a flat bed without using bedrails?: A Little Help needed moving from lying on your back to sitting on the side of a flat bed without using bedrails?: A Little Help needed moving to and from a bed to a chair (including a wheelchair)?: A Little Help needed standing up from a  chair using your arms (e.g., wheelchair or bedside chair)?: A Little Help needed to walk in hospital room?: A Little Help needed climbing 3-5 steps with a railing? : A Little 6 Click Score: 18    End of Session Equipment Utilized During Treatment: Oxygen Activity Tolerance: Patient tolerated treatment well Patient left: in bed;with call bell/phone within reach;with bed alarm set Nurse Communication: Mobility status PT Visit Diagnosis: Unsteadiness on feet (R26.81);Other abnormalities of gait and mobility (R26.89);Muscle weakness (generalized) (M62.81);Pain Pain - part of body:  (abdomen)     Time: 0109-3235 PT Time Calculation (min) (ACUTE ONLY): 23 min  Charges:  $Therapeutic Exercise: 23-37 mins                     Perrin Maltese, PT, DPT Acute Rehabilitation Services Pager [phone] Office [phone]    Melene Plan Allred 10/30/2020, 9:29 AM

## 2020-10-30 NOTE — Progress Notes (Signed)
Patient ID: Ethan Mcneil, male   DOB: 1934/05/28, 86 y.o.   MRN: [id number] [facility] Progress Note:   4 Days Post-Op   Subjective: No n/v.  Sore, but pain is not bad.  Foley pulled out this AM.     Objective: Vital signs in last 24 hours: Temp:  [97.8 F (36.6 C)-98.7 F (37.1 C)] 98.3 F (36.8 C) (11/15 1326) Pulse Rate:  [51-64] 52 (11/15 1326) Resp:  [12-18] 18 (11/15 1326) BP: (151-187)/(62-76) 151/62 (11/15 1326) SpO2:  [93 %-98 %] 98 % (11/15 1326) FiO2 (%):  [28 %] 28 % (11/15 0849)  Intake/Output from previous day: 11/14 0701 - 11/15 0700 In: 1763.3 [I.V.:1633.2] Out: 3225 [Urine:3225] Intake/Output this shift: Total I/O In: 1531.7 [P.O.:800; I.V.:540.1; Other:191.5] Out: 1200 [Urine:1200]  Physical Exam:  Alert, oriented.' Breathing comfortably Drain serosang Ext warm.   Incision c/d/i.   Lab Results:  Results for orders placed or performed during the hospital encounter of 10/26/20 (from the past 48 hour(s))  Glucose, capillary     Status: Abnormal   Collection Time: 10/28/20  8:37 PM  Result Value Ref Range   Glucose-Capillary 103 (H) 70 - 99 mg/dL    Comment: Glucose reference range applies only to samples taken after fasting for at least 8 hours.  CBC     Status: Abnormal   Collection Time: 10/29/20  3:07 AM  Result Value Ref Range   WBC 9.9 4.0 - 10.5 K/uL   RBC 3.00 (L) 4.22 - 5.81 MIL/uL   Hemoglobin 9.2 (L) 13.0 - 17.0 g/dL   HCT 28.6 (L) 39 - 52 %   MCV 95.3 80.0 - 100.0 fL   MCH 30.7 26.0 - 34.0 pg   MCHC 32.2 30.0 - 36.0 g/dL   RDW 14.3 11.5 - 15.5 %   Platelets 144 (L) 150 - 400 K/uL   nRBC 0.0 0.0 - 0.2 %    Comment: Performed at [facility] [address], [place], [place] [address]  Comprehensive metabolic panel     Status: Abnormal   Collection Time: 10/29/20  3:07 AM  Result Value Ref Range   Sodium 135 135 - 145 mmol/L   Potassium 4.7 3.5 - 5.1 mmol/L   Chloride 103 98 - 111 mmol/L   CO2 27 22 - 32 mmol/L    Glucose, Bld 101 (H) 70 - 99 mg/dL    Comment: Glucose reference range applies only to samples taken after fasting for at least 8 hours.   BUN 13 8 - 23 mg/dL   Creatinine, Ser 1.72 (H) 0.61 - 1.24 mg/dL   Calcium 8.0 (L) 8.9 - 10.3 mg/dL   Total Protein 4.5 (L) 6.5 - 8.1 g/dL   Albumin 2.4 (L) 3.5 - 5.0 g/dL   AST 18 15 - 41 U/L   ALT 13 0 - 44 U/L   Alkaline Phosphatase 40 38 - 126 U/L   Total Bilirubin 0.6 0.3 - 1.2 mg/dL   GFR, Estimated 39 (L) >60 mL/min    Comment: (NOTE) Calculated using the CKD-EPI Creatinine Equation (2021)    Anion gap 5 5 - 15    Comment: Performed at [facility], [place] [address], [place], [place] [address]  Magnesium     Status: None   Collection Time: 10/29/20  3:07 AM  Result Value Ref Range   Magnesium 2.0 1.7 - 2.4 mg/dL    Comment: Performed at [facility] [place],  [place] [address]  Glucose, capillary     Status: Abnormal   Collection Time: 10/29/20  8:19 AM  Result Value Ref Range   Glucose-Capillary 65 (L) 70 - 99 mg/dL    Comment: Glucose reference range applies only to samples taken after fasting for at least 8 hours.  Glucose, capillary     Status: None   Collection Time: 10/29/20  9:07 AM  Result Value Ref Range   Glucose-Capillary 91 70 - 99 mg/dL    Comment: Glucose reference range applies only to samples taken after fasting for at least 8 hours.  Glucose, capillary     Status: None   Collection Time: 10/29/20 11:35 AM  Result Value Ref Range   Glucose-Capillary 78 70 - 99 mg/dL    Comment: Glucose reference range applies only to samples taken after fasting for at least 8 hours.  Glucose, capillary     Status: None   Collection Time: 10/29/20  5:07 PM  Result Value Ref Range   Glucose-Capillary 94 70 - 99 mg/dL    Comment: Glucose reference range applies only to samples taken after fasting for at least 8 hours.  Glucose, capillary     Status: Abnormal   Collection Time: 10/29/20  9:04 PM  Result  Value Ref Range   Glucose-Capillary 122 (H) 70 - 99 mg/dL    Comment: Glucose reference range applies only to samples taken after fasting for at least 8 hours.  CBC     Status: Abnormal   Collection Time: 10/30/20  3:18 AM  Result Value Ref Range   WBC 9.8 4.0 - 10.5 K/uL    Comment: WHITE COUNT CONFIRMED ON SMEAR   RBC 3.03 (L) 4.22 - 5.81 MIL/uL   Hemoglobin 9.3 (L) 13.0 - 17.0 g/dL   HCT 28.8 (L) 39 - 52 %   MCV 95.0 80.0 - 100.0 fL   MCH 30.7 26.0 - 34.0 pg   MCHC 32.3 30.0 - 36.0 g/dL   RDW 14.1 11.5 - 15.5 %   Platelets 163 150 - 400 K/uL   nRBC 0.0 0.0 - 0.2 %    Comment: Performed at [facility], [place] [address], [place], [place] [address]  Comprehensive metabolic panel     Status: Abnormal   Collection Time: 10/30/20  3:18 AM  Result Value Ref Range   Sodium 136 135 - 145 mmol/L   Potassium 4.2 3.5 - 5.1 mmol/L   Chloride 100 98 - 111 mmol/L   CO2 28 22 - 32 mmol/L   Glucose, Bld 109 (H) 70 - 99 mg/dL    Comment: Glucose reference range applies only to samples taken after fasting for at least 8 hours.   BUN 12 8 - 23 mg/dL   Creatinine, Ser 1.60 (H) 0.61 - 1.24 mg/dL   Calcium 8.0 (L) 8.9 - 10.3 mg/dL   Total Protein 4.5 (L) 6.5 - 8.1 g/dL   Albumin 2.3 (L) 3.5 - 5.0 g/dL   AST 16 15 - 41 U/L   ALT 12 0 - 44 U/L   Alkaline Phosphatase 44 38 - 126 U/L   Total Bilirubin 0.5 0.3 - 1.2 mg/dL   GFR, Estimated 42 (L) >60 mL/min    Comment: (NOTE) Calculated using the CKD-EPI Creatinine Equation (2021)    Anion gap 8 5 - 15    Comment: Performed at [facility], [place] [address], [place], [place] [address]  Magnesium     Status: None   Collection  Time: 10/30/20  3:18 AM  Result Value Ref Range   Magnesium 1.8 1.7 - 2.4 mg/dL    Comment: Performed at [facility] [address], [place], [place] [address]  Glucose, capillary     Status: None   Collection Time: 10/30/20  8:14 AM  Result Value Ref Range   Glucose-Capillary 90 70 - 99 mg/dL     Comment: Glucose reference range applies only to samples taken after fasting for at least 8 hours.  Glucose, capillary     Status: Abnormal   Collection Time: 10/30/20 12:16 PM  Result Value Ref Range   Glucose-Capillary 166 (H) 70 - 99 mg/dL    Comment: Glucose reference range applies only to samples taken after fasting for at least 8 hours.  Glucose, capillary     Status: Abnormal   Collection Time: 10/30/20  4:49 PM  Result Value Ref Range   Glucose-Capillary 154 (H) 70 - 99 mg/dL    Comment: Glucose reference range applies only to samples taken after fasting for at least 8 hours.    Radiology/Results: No results found.  Anti-infectives: Anti-infectives (From admission, onward)   Start     Dose/Rate Route Frequency Ordered Stop   10/26/20 0715  ceFAZolin (ANCEF) IVPB 2g/100 mL premix        2 g 200 mL/hr over 30 Minutes Intravenous On call to O.R. 10/26/20 0713 10/26/20 0944      Assessment/Plan: Problem List: Patient Active Problem List   Diagnosis Date Noted  . IPMN (intraductal papillary mucinous neoplasm) 10/23/2020  . Pancreatic mass 05/22/2020  . Pancreatic insufficiency 03/17/2017  . PCP NOTES >>>> 11/26/2015  . Vitamin D deficiency 04/14/2015  . Loss of weight-- fatigue 01/13/2015  . Urolithiasis 10/10/2014  . Vitamin B 12 deficiency 10/10/2014  . Anemia 10/10/2014  . Annual physical exam 03/07/2014  . 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 Hypothyroidism 04/21/2012  . DJD (degenerative joint disease) 04/21/2012  . HEPATIC CYST 10/03/2010  . BRADYCARDIA 05/02/2009  . PVD 05/02/2009  . HYPERPLASIA PROSTATE UNS W/O UR OBST & OTH LUTS 12/30/2008  . DM (diabetes mellitus) type II uncontrolled, periph vascular disorder ([place]) 07/04/2008  . HYPERLIPIDEMIA 11/03/2007  . HTN (hypertension) 11/03/2007  . CAD (coronary artery disease) 11/03/2007  . POSTSURGICAL AORTOCORONARY BYPASS STATUS 10/30/2007  . HERNIORRHAPHY, HX OF 10/30/2007    Will try soft diet as patient  is passing gas.    4 Days Post-Op    LOS: 4 days    Milus Height, MD FACS Surgical Oncology, General Surgery, Trauma and [facility], [place] for weekday/non holidays Check [email or web address] for coverage night/weekend/holidays  Do not use SecureChat as it is not reliable for timely patient care.     10/30/2020 6:26 PM

## 2020-10-31 LAB — COMPREHENSIVE METABOLIC PANEL
ALT: 14 U/L (ref 0–44)
AST: 20 U/L (ref 15–41)
Albumin: 2.3 g/dL — ABNORMAL LOW (ref 3.5–5.0)
Alkaline Phosphatase: 42 U/L (ref 38–126)
Anion gap: 7 (ref 5–15)
BUN: 10 mg/dL (ref 8–23)
CO2: 27 mmol/L (ref 22–32)
Calcium: 7.9 mg/dL — ABNORMAL LOW (ref 8.9–10.3)
Chloride: 101 mmol/L (ref 98–111)
Creatinine, Ser: 1.74 mg/dL — ABNORMAL HIGH (ref 0.61–1.24)
GFR, Estimated: 38 mL/min — ABNORMAL LOW (ref >60)
Glucose, Bld: 269 mg/dL — ABNORMAL HIGH (ref 70–99)
Potassium: 4.6 mmol/L (ref 3.5–5.1)
Sodium: 135 mmol/L (ref 135–145)
Total Bilirubin: 0.6 mg/dL (ref 0.3–1.2)
Total Protein: 4.7 g/dL — ABNORMAL LOW (ref 6.5–8.1)

## 2020-10-31 LAB — CBC
HCT: 27.8 % — ABNORMAL LOW (ref 39.0–52.0)
Hemoglobin: 9 g/dL — ABNORMAL LOW (ref 13.0–17.0)
MCH: 31 pg (ref 26.0–34.0)
MCHC: 32.4 g/dL (ref 30.0–36.0)
MCV: 95.9 fL (ref 80.0–100.0)
Platelets: 196 10*3/uL (ref 150–400)
RBC: 2.9 MIL/uL — ABNORMAL LOW (ref 4.22–5.81)
RDW: 14 % (ref 11.5–15.5)
WBC: 8.1 10*3/uL (ref 4.0–10.5)
nRBC: 0 % (ref 0.0–0.2)

## 2020-10-31 LAB — GLUCOSE, CAPILLARY
Glucose-Capillary: 163 mg/dL — ABNORMAL HIGH (ref 70–99)
Glucose-Capillary: 159 mg/dL — ABNORMAL HIGH (ref 70–99)
Glucose-Capillary: 234 mg/dL — ABNORMAL HIGH (ref 70–99)
Glucose-Capillary: 185 mg/dL — ABNORMAL HIGH (ref 70–99)

## 2020-10-31 LAB — MAGNESIUM: Magnesium: 1.7 mg/dL (ref 1.7–2.4)

## 2020-10-31 LAB — SURGICAL PATHOLOGY

## 2020-10-31 MED ORDER — SENNOSIDES-DOCUSATE SODIUM 8.6-50 MG PO TABS
1.0000 | ORAL_TABLET | Freq: Two times a day (BID) | ORAL | Status: DC
  Administered 2020-10-31 – 2020-11-01 (×4): 1 via ORAL
  Filled 2020-10-31 (×4): qty 1

## 2020-10-31 MED ORDER — BISACODYL 10 MG RE SUPP
10.0000 mg | Freq: Once | RECTAL | Status: AC
  Administered 2020-10-31: 10 mg via RECTAL
  Filled 2020-10-31: qty 1

## 2020-10-31 MED ORDER — MAGNESIUM SULFATE 50 % IJ SOLN
3.0000 g | Freq: Once | INTRAVENOUS | Status: AC
  Administered 2020-10-31: 3 g via INTRAVENOUS
  Filled 2020-10-31: qty 6

## 2020-10-31 NOTE — Progress Notes (Signed)
5 Days Post-Op s/p open distal pancreatectomy  Subjective/Chief Complaint: - No acute events overnight - Denies nausea or vomiting - Tolerating soft diet, low volume - Ambulating well - Denies flatus/BM   Objective: Vital signs in last 24 hours: Temp:  [97.1 F (36.2 C)-98.3 F (36.8 C)] 97.1 F (36.2 C) (11/16 0457) Pulse Rate:  [52-64] 56 (11/16 0457) Resp:  [14-18] 15 (11/16 0457) BP: (139-168)/(62-76) 156/67 (11/16 0457) SpO2:  [95 %-99 %] 95 % (11/16 0827) Last BM Date: 10/26/20  Intake/Output from previous day: 11/15 0701 - 11/16 0700 In: 2269 [P.O.:1000; I.V.:1077.4] Out: 2700 [Urine:2700] Intake/Output this shift: Total I/O In: -  Out: 200 [Urine:200]  General appearance: alert, cooperative and no distress Resp: No increased work of breathing on RA Cardio: regular rate and rhythm GI: soft, mildly tender to palpation, non-distended, no rebound, drain without sersanguinous output in bag Neurologic: Grossly normal Incision/Wound: Incision c/d/i with small clot/scab at mid portion of incision   Lab Results:  Recent Labs    10/30/20 0318 10/31/20 0049  WBC 9.8 8.1  HGB 9.3* 9.0*  HCT 28.8* 27.8*  PLT 163 196   BMET Recent Labs    10/30/20 0318 10/31/20 0049  NA 136 135  K 4.2 4.6  CL 100 101  CO2 28 27  GLUCOSE 109* 269*  BUN 12 10  CREATININE 1.60* 1.74*  CALCIUM 8.0* 7.9*   PT/INR No results for input(s): LABPROT, INR in the last 72 hours. ABG No results for input(s): PHART, HCO3 in the last 72 hours.  Invalid input(s): PCO2, PO2  Studies/Results: No results found.  Anti-infectives: Anti-infectives (From admission, onward)   Start     Dose/Rate Route Frequency Ordered Stop   10/26/20 0715  ceFAZolin (ANCEF) IVPB 2g/100 mL premix        2 g 200 mL/hr over 30 Minutes Intravenous On call to O.R. 10/26/20 0713 10/26/20 0944      Assessment/Plan: s/p Procedure(s): OPEN DISTAL PANCREATECTOMY (N/A) SPLENECTOMY (N/A)  - Continue soft  diet - Continue epidural catheter until return of bowel function - Will keep abdominal drain today - Doc/Senna PO bowel regimen - Suppository today - SSI, heparin DVT PPx - OOB - PT   LOS: 5 days   Sheria Lang  General Surgery  10/31/2020

## 2020-10-31 NOTE — Progress Notes (Signed)
Physical Therapy Treatment Patient Details Name: Ethan Mcneil MRN: [id number] DOB: February 18, 1934 Today's Date: 10/31/2020    History of Present Illness Patient is an 86 y/o male with PMH which includes pancreatic insufficiency/chronic pancreatitis, CABG 1997, CAD, PVD, diabetes, HTN, cardiac cath 2009. 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    PT Comments    Patient progressing towards physical therapy goals. Patient required overall supervision for all mobility. Patient ambulated 150' with RW and supervision. Patient able to perform pericare in sitting with supervision. Patient continues to be limited by pain, decreased activity tolerance, generalized weakness. Anticipate no PT follow up following discharge.     Follow Up Recommendations  No PT follow up;Supervision for mobility/OOB     Equipment Recommendations  Rolling Rithwik Schmieg with 5" wheels;3in1 (PT)    Recommendations for Other Services       Precautions / Restrictions Precautions Precautions: Fall Precaution Comments: abdominal incision, drain Restrictions Weight Bearing Restrictions: No    Mobility  Bed Mobility Overal bed mobility: Needs Assistance Bed Mobility: Supine to Sit     Supine to sit: Supervision;HOB elevated        Transfers Overall transfer level: Needs assistance Equipment used: Rolling Camay Pedigo (2 wheeled) Transfers: Sit to/from Stand Sit to Stand: Supervision         General transfer comment: cues for hand placement   Ambulation/Gait Ambulation/Gait assistance: Supervision Gait Distance (Feet): 150 Feet Assistive device: Rolling Kynzleigh Bandel (2 wheeled) Gait Pattern/deviations: Step-through pattern;Decreased stride length Gait velocity: decreased       Stairs             Wheelchair Mobility    Modified Rankin (Stroke Patients Only)       Balance Overall balance assessment: Needs assistance Sitting-balance support: No upper extremity  supported;Feet supported Sitting balance-[name]: Fair     Standing balance support: During functional activity;No upper extremity supported Standing balance-[name]: Fair                              Cognition Arousal/Alertness: Awake/alert Behavior During Therapy: WFL for tasks assessed/performed Overall Cognitive Status: Within Functional Limits for tasks assessed                                        Exercises      General Comments        Pertinent Vitals/Pain Pain Assessment: Faces Faces Pain Scale: Hurts a little bit Pain Location: abdomen Pain Descriptors / Indicators: Grimacing;Guarding;Sore Pain Intervention(s): Limited activity within patient's tolerance;Monitored during session    Home Living                      Prior Function            PT Goals (current goals can now be found in the care plan section) Acute Rehab PT Goals Patient Stated Goal: to go home PT Goal Formulation: With patient Time For Goal Achievement: 11/10/20 Potential to Achieve Goals: Good Progress towards PT goals: Progressing toward goals    Frequency    Min 3X/week      PT Plan Current plan remains appropriate    Co-evaluation              AM-PAC PT "6 Clicks" Mobility   Outcome Measure  Help needed turning from your back to  your side while in a flat bed without using bedrails?: A Little Help needed moving from lying on your back to sitting on the side of a flat bed without using bedrails?: A Little Help needed moving to and from a bed to a chair (including a wheelchair)?: A Little Help needed standing up from a chair using your arms (e.g., wheelchair or bedside chair)?: A Little Help needed to walk in hospital room?: A Little Help needed climbing 3-5 steps with a railing? : A Little 6 Click Score: 18    End of Session Equipment Utilized During Treatment: Gait belt Activity Tolerance: Patient tolerated treatment  well Patient left: with nursing/sitter in room (sitting EOB with NT ) Nurse Communication: Mobility status PT Visit Diagnosis: Unsteadiness on feet (R26.81);Other abnormalities of gait and mobility (R26.89);Muscle weakness (generalized) (M62.81);Pain Pain - part of body:  (abdomen)     Time: 1137-1207 PT Time Calculation (min) (ACUTE ONLY): 30 min  Charges:  $Therapeutic Activity: 23-37 mins                     Perrin Maltese, PT, DPT Acute Rehabilitation Services Pager [phone] Office [phone]    Melene Plan Allred 10/31/2020, 12:29 PM

## 2020-10-31 NOTE — Progress Notes (Signed)
Dr Doroteo Glassman notified this writer that the epidural catheter was removed by her.

## 2020-10-31 NOTE — Addendum Note (Signed)
Addendum  created 10/31/20 1359 by Nolon Nations, MD   Clinical Note Signed

## 2020-10-31 NOTE — TOC Initial Note (Signed)
Transition of Care [facility]) - Initial/Assessment Note    Patient Details  Name: Ethan Mcneil MRN: [id number] Date of Birth: 02-03-1934  Transition of Care [facility]) CM/SW Contact:    Marilu Favre, RN Phone Number: 10/31/2020, 1:57 PM  Clinical Narrative:                  Confirmed face sheet information with patient. Patient from home with wife. Already has walker and 3 in1 at home.   Will continue to follow. Expected Discharge Plan: Home/Self Care Barriers to Discharge: Continued Medical Work up   Patient Goals and CMS Choice Patient states their goals for this hospitalization and ongoing recovery are:: to return to home CMS Medicare.gov Compare Post Acute Care list provided to:: Patient Choice offered to / list presented to : NA  Expected Discharge Plan and Services Expected Discharge Plan: Home/Self Care   Discharge Planning Services: CM Consult   Living arrangements for the past 2 months: Single Family Home                 DME Arranged: N/A DME Agency: NA       HH Arranged: NA          Prior Living Arrangements/Services Living arrangements for the past 2 months: Single Family Home Lives with:: Spouse Patient language and need for interpreter reviewed:: Yes Do you feel safe going back to the place where you live?: Yes      Need for Family Participation in Patient Care: Yes (Comment) Care giver support system in place?: Yes (comment) Current home services: DME Criminal Activity/Legal Involvement Pertinent to Current Situation/Hospitalization: No - Comment as needed  Activities of Daily Living Home Assistive Devices/Equipment: Dentures (specify type), Eyeglasses, CBG Meter, Blood pressure cuff, Shower chair without back, Walker (specify type) ADL Screening (condition at time of admission) Patient's cognitive ability adequate to safely complete daily activities?: Yes Is the patient deaf or have difficulty hearing?: Yes Does the patient have difficulty  seeing, even when wearing glasses/contacts?: No Does the patient have difficulty concentrating, remembering, or making decisions?: No Patient able to express need for assistance with ADLs?: Yes Does the patient have difficulty dressing or bathing?: Yes Independently performs ADLs?: Yes (appropriate for developmental age) Does the patient have difficulty walking or climbing stairs?: No Weakness of Legs: None Weakness of Arms/Hands: None  Permission Sought/Granted   Permission granted to share information with : No              Emotional Assessment Appearance:: Appears stated age Attitude/Demeanor/Rapport: Engaged Affect (typically observed): Accepting Orientation: : Oriented to Self, Oriented to Place, Oriented to  Time, Oriented to Situation Alcohol / Substance Use: Not Applicable Psych Involvement: No (comment)  Admission diagnosis:  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 IPMN (intraductal papillary mucinous neoplasm) 10/23/2020  . Pancreatic mass 05/22/2020  . Pancreatic insufficiency 03/17/2017  . PCP NOTES >>>> 11/26/2015  . Vitamin D deficiency 04/14/2015  . Loss of weight-- fatigue 01/13/2015  . Urolithiasis 10/10/2014  . Vitamin B 12 deficiency 10/10/2014  . Anemia 10/10/2014  . Annual physical exam 03/07/2014  . 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 Hypothyroidism 04/21/2012  . DJD (degenerative joint disease) 04/21/2012  . HEPATIC CYST 10/03/2010  . BRADYCARDIA 05/02/2009  . PVD 05/02/2009  . HYPERPLASIA PROSTATE UNS W/O UR OBST & OTH LUTS 12/30/2008  . DM (diabetes mellitus) type II uncontrolled, periph vascular disorder ([place]) 07/04/2008  .  HYPERLIPIDEMIA 11/03/2007  . HTN (hypertension) 11/03/2007  . CAD (coronary artery disease) 11/03/2007  . POSTSURGICAL AORTOCORONARY BYPASS STATUS 10/30/2007  . HERNIORRHAPHY, HX OF 10/30/2007   PCP:  Colon Branch, MD Pharmacy:   [id number] -  [place], [place]  [address] Phone: [phone] Fax: [phone]     Social Determinants of Health (SDOH) Interventions    Readmission Risk Interventions No flowsheet data found.

## 2020-10-31 NOTE — Anesthesia Post-op Follow-up Note (Signed)
  Anesthesia Pain Follow-up Note  Patient: Ethan Mcneil  Day #: 3  Date of Follow-up: 10/31/2020 Time: 1:58 PM  Last Vitals:  Vitals:   10/31/20 1005 10/31/20 1303  BP: (!) 162/75   Pulse: 61   Resp: 18 13  Temp: (!) 36.4 C   SpO2: 96% 98%    Level of Consciousness: alert  Pain: mild, moderate   Side Effects:None  Catheter Site Exam:clean, dry, no drainage  Anti-Coag Meds (From admission, onward)   Start     Dose/Rate Route Frequency Ordered Stop   10/27/20 0830  heparin injection 5,000 Units        5,000 Units Subcutaneous Every 8 hours 10/27/20 0735         Plan: Continue current therapy of postop epidural at surgeon's request  Nolon Nations

## 2020-10-31 NOTE — Anesthesia Post-op Follow-up Note (Signed)
  Anesthesia Pain Follow-up Note  Patient: BRAE GARTMAN  Day #: 6  Date of Follow-up: 10/31/2020 Time: 9:05 AM  Last Vitals:  Vitals:   10/31/20 0457 10/31/20 0827  BP: (!) 156/67   Pulse: (!) 56   Resp: 15   Temp: (!) 36.2 C   SpO2: 99% 95%    Level of Consciousness: alert  Pain: mild   Side Effects:None  Catheter Site Exam:clean  Anti-Coag Meds (From admission, onward)   Start     Dose/Rate Route Frequency Ordered Stop   10/27/20 0830  heparin injection 5,000 Units        5,000 Units Subcutaneous Every 8 hours 10/27/20 0735      Epidural / Intrathecal (From admission, onward)   Start     Dose/Rate Route Frequency Ordered Stop   10/26/20 1245  ropivacaine (PF) 2 mg/mL (0.2%) (NAROPIN) injection        10 mL/hr 10 mL/hr  Epidural Continuous 10/26/20 1238 10/31/20 1244       Plan: Catheter removed/tip intact at surgeon's request  Pervis Hocking

## 2020-10-31 NOTE — Addendum Note (Signed)
Addendum  created 10/31/20 0936 by Pervis Hocking, DO   Pend clinical note

## 2020-11-01 LAB — COMPREHENSIVE METABOLIC PANEL
ALT: 13 U/L (ref 0–44)
AST: 19 U/L (ref 15–41)
Albumin: 2.5 g/dL — ABNORMAL LOW (ref 3.5–5.0)
Alkaline Phosphatase: 48 U/L (ref 38–126)
Anion gap: 6 (ref 5–15)
BUN: 12 mg/dL (ref 8–23)
CO2: 27 mmol/L (ref 22–32)
Calcium: 8.3 mg/dL — ABNORMAL LOW (ref 8.9–10.3)
Chloride: 102 mmol/L (ref 98–111)
Creatinine, Ser: 1.6 mg/dL — ABNORMAL HIGH (ref 0.61–1.24)
GFR, Estimated: 42 mL/min — ABNORMAL LOW (ref >60)
Glucose, Bld: 165 mg/dL — ABNORMAL HIGH (ref 70–99)
Potassium: 4.7 mmol/L (ref 3.5–5.1)
Sodium: 135 mmol/L (ref 135–145)
Total Bilirubin: 0.2 mg/dL — ABNORMAL LOW (ref 0.3–1.2)
Total Protein: 5 g/dL — ABNORMAL LOW (ref 6.5–8.1)

## 2020-11-01 LAB — CBC
HCT: 31.3 % — ABNORMAL LOW (ref 39.0–52.0)
Hemoglobin: 10.1 g/dL — ABNORMAL LOW (ref 13.0–17.0)
MCH: 30.2 pg (ref 26.0–34.0)
MCHC: 32.3 g/dL (ref 30.0–36.0)
MCV: 93.7 fL (ref 80.0–100.0)
Platelets: 250 10*3/uL (ref 150–400)
RBC: 3.34 MIL/uL — ABNORMAL LOW (ref 4.22–5.81)
RDW: 14.1 % (ref 11.5–15.5)
WBC: 9.1 10*3/uL (ref 4.0–10.5)
nRBC: 0 % (ref 0.0–0.2)

## 2020-11-01 LAB — GLUCOSE, CAPILLARY
Glucose-Capillary: 137 mg/dL — ABNORMAL HIGH (ref 70–99)
Glucose-Capillary: 152 mg/dL — ABNORMAL HIGH (ref 70–99)
Glucose-Capillary: 205 mg/dL — ABNORMAL HIGH (ref 70–99)
Glucose-Capillary: 175 mg/dL — ABNORMAL HIGH (ref 70–99)

## 2020-11-01 LAB — MAGNESIUM: Magnesium: 2 mg/dL (ref 1.7–2.4)

## 2020-11-01 MED ORDER — FLEET ENEMA 7-19 GM/118ML RE ENEM
1.0000 | ENEMA | Freq: Once | RECTAL | Status: AC
  Administered 2020-11-01: 1 via RECTAL
  Filled 2020-11-01: qty 1

## 2020-11-01 MED ORDER — FENTANYL CITRATE (PF) 100 MCG/2ML IJ SOLN: 25.0000 ug | INTRAMUSCULAR | Status: DC | PRN

## 2020-11-01 MED ORDER — ACETAMINOPHEN 325 MG PO TABS
650.0000 mg | ORAL_TABLET | Freq: Four times a day (QID) | ORAL | Status: DC
  Administered 2020-11-01 – 2020-11-02 (×6): 650 mg via ORAL
  Filled 2020-11-01 (×6): qty 2

## 2020-11-01 MED ORDER — OXYCODONE HCL 5 MG PO TABS: 5.0000 mg | ORAL_TABLET | Freq: Four times a day (QID) | ORAL | Status: DC | PRN

## 2020-11-01 MED ORDER — TRAMADOL HCL 50 MG PO TABS: 50.0000 mg | ORAL_TABLET | Freq: Four times a day (QID) | ORAL | Status: DC | PRN

## 2020-11-01 NOTE — Progress Notes (Signed)
6 Days Post-Op s/p open distal pancreatectomy  Subjective/Chief Complaint: - No acute events overnight - Denies nausea or vomiting - Tolerating soft diet, low volume - Ambulating well - Denies flatus/BM  Objective: Vital signs in last 24 hours: Temp:  [97.5 F (36.4 C)-98.3 F (36.8 C)] 98 F (36.7 C) (11/17 6384) Pulse Rate:  [55-61] 55 (11/17 0640) Resp:  [13-18] 15 (11/17 0640) BP: (162-186)/(66-78) 186/76 (11/17 0640) SpO2:  [95 %-100 %] 99 % (11/17 0640) FiO2 (%):  [41 %] 41 % (11/16 2036) Last BM Date: 10/31/20 (per patient)  Intake/Output from previous day: 11/16 0701 - 11/17 0700 In: 2002.6 [P.O.:1060; I.V.:942.6] Out: 1720 [Urine:1700; Drains:20] Intake/Output this shift: Total I/O In: -  Out: 400 [Urine:400]  General appearance: alert, cooperative and no distress Resp: No increased work of breathing on RA Cardio: regular rate and rhythm GI: soft, mildly tender to palpation, non-distended, no rebound, drain without sersanguinous output in bag Neurologic: Grossly normal Incision/Wound: Incision c/d/i with small clot/scab at mid portion of incision   Lab Results:  Recent Labs    10/31/20 0049 11/01/20 0349  WBC 8.1 9.1  HGB 9.0* 10.1*  HCT 27.8* 31.3*  PLT 196 250   BMET Recent Labs    10/31/20 0049 11/01/20 0349  NA 135 135  K 4.6 4.7  CL 101 102  CO2 27 27  GLUCOSE 269* 165*  BUN 10 12  CREATININE 1.74* 1.60*  CALCIUM 7.9* 8.3*   PT/INR No results for input(s): LABPROT, INR in the last 72 hours. ABG No results for input(s): PHART, HCO3 in the last 72 hours.  Invalid input(s): PCO2, PO2  Studies/Results: No results found.  Anti-infectives: Anti-infectives (From admission, onward)   Start     Dose/Rate Route Frequency Ordered Stop   10/26/20 0715  ceFAZolin (ANCEF) IVPB 2g/100 mL premix        2 g 200 mL/hr over 30 Minutes Intravenous On call to O.R. 10/26/20 0713 10/26/20 0944      Assessment/Plan: s/p Procedure(s): OPEN  DISTAL PANCREATECTOMY (N/A) SPLENECTOMY (N/A)  - Continue soft diet - Continue epidural catheter until return of bowel function - Will keep abdominal drain today - Doc/Senna PO bowel regimen - Enema today - SSI, heparin DVT PPx - OOB - PT   LOS: 6 days   Sheria Lang  General Surgery  11/01/2020

## 2020-11-02 LAB — COMPREHENSIVE METABOLIC PANEL
ALT: 18 U/L (ref 0–44)
AST: 21 U/L (ref 15–41)
Albumin: 2.5 g/dL — ABNORMAL LOW (ref 3.5–5.0)
Alkaline Phosphatase: 43 U/L (ref 38–126)
Anion gap: 7 (ref 5–15)
BUN: 13 mg/dL (ref 8–23)
CO2: 27 mmol/L (ref 22–32)
Calcium: 8.4 mg/dL — ABNORMAL LOW (ref 8.9–10.3)
Chloride: 103 mmol/L (ref 98–111)
Creatinine, Ser: 1.54 mg/dL — ABNORMAL HIGH (ref 0.61–1.24)
GFR, Estimated: 44 mL/min — ABNORMAL LOW (ref >60)
Glucose, Bld: 123 mg/dL — ABNORMAL HIGH (ref 70–99)
Potassium: 4.4 mmol/L (ref 3.5–5.1)
Sodium: 137 mmol/L (ref 135–145)
Total Bilirubin: 0.3 mg/dL (ref 0.3–1.2)
Total Protein: 5 g/dL — ABNORMAL LOW (ref 6.5–8.1)

## 2020-11-02 LAB — CBC
HCT: 32 % — ABNORMAL LOW (ref 39.0–52.0)
Hemoglobin: 10.3 g/dL — ABNORMAL LOW (ref 13.0–17.0)
MCH: 30.6 pg (ref 26.0–34.0)
MCHC: 32.2 g/dL (ref 30.0–36.0)
MCV: 95 fL (ref 80.0–100.0)
Platelets: 292 10*3/uL (ref 150–400)
RBC: 3.37 MIL/uL — ABNORMAL LOW (ref 4.22–5.81)
RDW: 14.2 % (ref 11.5–15.5)
WBC: 8.4 10*3/uL (ref 4.0–10.5)
nRBC: 0 % (ref 0.0–0.2)

## 2020-11-02 LAB — GLUCOSE, CAPILLARY
Glucose-Capillary: 120 mg/dL — ABNORMAL HIGH (ref 70–99)
Glucose-Capillary: 151 mg/dL — ABNORMAL HIGH (ref 70–99)

## 2020-11-02 LAB — MAGNESIUM: Magnesium: 1.9 mg/dL (ref 1.7–2.4)

## 2020-11-02 MED ORDER — OXYCODONE HCL 5 MG PO TABS: 5.0000 mg | ORAL_TABLET | ORAL | Status: DC | PRN

## 2020-11-02 MED ORDER — ONDANSETRON 4 MG PO TBDP: 4.0000 mg | ORAL_TABLET | Freq: Four times a day (QID) | ORAL | 0 refills | Status: DC | PRN

## 2020-11-02 MED ORDER — MAGNESIUM SULFATE IN D5W 1-5 GM/100ML-% IV SOLN
1.0000 g | Freq: Once | INTRAVENOUS | Status: AC
  Administered 2020-11-02: 1 g via INTRAVENOUS
  Filled 2020-11-02: qty 100

## 2020-11-02 MED ORDER — SENNOSIDES-DOCUSATE SODIUM 8.6-50 MG PO TABS
2.0000 | ORAL_TABLET | Freq: Two times a day (BID) | ORAL | Status: DC
  Administered 2020-11-02: 2 via ORAL
  Filled 2020-11-02: qty 2

## 2020-11-02 MED ORDER — TRAMADOL HCL 50 MG PO TABS: 50.0000 mg | ORAL_TABLET | Freq: Four times a day (QID) | ORAL | 0 refills | Status: DC | PRN

## 2020-11-02 NOTE — Discharge Instructions (Signed)
[facility]      [facility] °[phone] ° °ABDOMINAL SURGERY: POST OP INSTRUCTIONS ° °Always review your discharge instruction sheet given to you by the facility where your surgery was performed. ° °IF YOU HAVE DISABILITY OR FAMILY LEAVE FORMS, YOU MUST BRING THEM TO THE OFFICE FOR PROCESSING.  PLEASE DO NOT GIVE THEM TO YOUR DOCTOR. ° °1. A prescription for pain medication may be given to you upon discharge.  Take your pain medication as prescribed, if needed.  If narcotic pain medicine is not needed, then you may take acetaminophen (Tylenol) or ibuprofen (Advil) as needed. °2. Take your usually prescribed medications unless otherwise directed. °3. If you need a refill on your pain medication, please contact your pharmacy. They will contact our office to request authorization.  Prescriptions will not be filled after 5pm or on week-ends. °4. You should follow a light diet the first few days after arrival home, such as soup and crackers, pudding, etc.unless your doctor has advised otherwise. A high-fiber, low fat diet can be resumed as tolerated.   Be sure to include lots of fluids daily. Most patients will experience some swelling and bruising on the chest and neck area.  Ice packs will help.  Swelling and bruising can take several days to resolve °5. Most patients will experience some swelling and bruising in the area of the incision. Ice pack will help. Swelling and bruising can take several days to resolve..  °6. It is common to experience some constipation if taking pain medication after surgery.  Increasing fluid intake and taking a stool softener will usually help or prevent this problem from occurring.  A mild laxative (Milk of Magnesia or Miralax) should be taken according to package directions if there are no bowel movements after 48 hours. °7.  You may have steri-strips (small skin tapes) in place directly over the incision.  These strips should be left on the skin for 10-14 days.  If your  surgeon used skin glue on the incision, you may shower in 48 hours.  The glue will flake off over the next 2-3 weeks.  Any sutures or staples will be removed at the office during your follow-up visit. You may find that a light gauze bandage over your incision may keep your staples from being rubbed or pulled. You may shower and replace the bandage daily. °8. ACTIVITIES:  You may resume regular (light) daily activities beginning the next day--such as daily self-care, walking, climbing stairs--gradually increasing activities as tolerated.  You may have sexual intercourse when it is comfortable.  Refrain from any heavy lifting or straining until approved by your doctor. °a. You may drive when you no longer are taking prescription pain medication, you can comfortably wear a seatbelt, and you can safely maneuver your car and apply brakes °b. Return to Work: __________8 weeks if applicable_________________________ °9. You should see your doctor in the office for a follow-up appointment approximately two weeks after your surgery.  Make sure that you call for this appointment within a day or two after you arrive home to insure a convenient appointment time. °OTHER INSTRUCTIONS:  °_____________________________________________________________ °_____________________________________________________________ ° °WHEN TO CALL YOUR DOCTOR: °1. Fever over 101.0 °2. Inability to urinate °3. Nausea and/or vomiting °4. Extreme swelling or bruising °5. Continued bleeding from incision. °6. Increased pain, redness, or drainage from the incision. °7. Difficulty swallowing or breathing °8. Muscle cramping or spasms. °9. Numbness or tingling in hands or feet or around lips. ° °The clinic staff is   available to answer your questions during regular business hours.  Please don’t hesitate to call and ask to speak to one of the nurses if you have concerns. ° °For further questions, please visit [email or web address] ° ° ° °

## 2020-11-02 NOTE — Progress Notes (Signed)
7 Days Post-Op s/p open distal pancreatectomy  Subjective/Chief Complaint: - No acute events overnight - Denies nausea or vomiting - Tolerating soft diet, low volume - Ambulating well - Abdominal drain removed yesterday - 1 "medium" bowel movement yesterday  Objective: Vital signs in last 24 hours: Temp:  [98 F (36.7 C)-98.6 F (37 C)] 98.3 F (36.8 C) (11/18 0452) Pulse Rate:  [55-60] 55 (11/18 0452) Resp:  [13-16] 16 (11/18 0452) BP: (150-186)/(73-78) 159/75 (11/18 0452) SpO2:  [96 %-99 %] 98 % (11/18 0452) Last BM Date: 11/01/20  Intake/Output from previous day: 11/17 0701 - 11/18 0700 In: 640 [P.O.:640] Out: 1850 [Urine:1850] Intake/Output this shift: Total I/O In: -  Out: 550 [Urine:550]  General appearance: alert, cooperative and no distress Resp: No increased work of breathing on RA Cardio: regular rate and rhythm GI: soft, mildly tender to palpation, non-distended, no rebound, drain without sersanguinous output in bag Neurologic: Grossly normal Incision/Wound: Incision c/d/i with small clot/scab at mid portion of incision   Lab Results:  Recent Labs    11/01/20 0349 11/02/20 0254  WBC 9.1 8.4  HGB 10.1* 10.3*  HCT 31.3* 32.0*  PLT 250 292   BMET Recent Labs    11/01/20 0349 11/02/20 0254  NA 135 137  K 4.7 4.4  CL 102 103  CO2 27 27  GLUCOSE 165* 123*  BUN 12 13  CREATININE 1.60* 1.54*  CALCIUM 8.3* 8.4*   PT/INR No results for input(s): LABPROT, INR in the last 72 hours. ABG No results for input(s): PHART, HCO3 in the last 72 hours.  Invalid input(s): PCO2, PO2  Studies/Results: No results found.  Anti-infectives: Anti-infectives (From admission, onward)   Start     Dose/Rate Route Frequency Ordered Stop   10/26/20 0715  ceFAZolin (ANCEF) IVPB 2g/100 mL premix        2 g 200 mL/hr over 30 Minutes Intravenous On call to O.R. 10/26/20 0713 10/26/20 0944      Assessment/Plan: s/p Procedure(s): OPEN DISTAL PANCREATECTOMY  (N/A) SPLENECTOMY (N/A)  - Continue soft diet - PO pain regimen - Doc/Senna PO bowel regimen - SSI, heparin DVT PPx - OOB - PT  Dispo: Can discharge to home either today or tomorrow pending continued bowel function    LOS: 7 days   Ethan Mcneil  General Surgery  11/02/2020

## 2020-11-02 NOTE — Progress Notes (Signed)
Patient agreeable with discharge today. Discharge instructions and teaching provided using teachback method. All lines, drains and tubes discontinued. Patient was transported to private vehicle via wheelchair accompanied by granddaughter. No acute distress noted. No further concerns or complaints.

## 2020-11-03 NOTE — Discharge Summary (Signed)
Physician Discharge Summary  Patient ID: Ethan Mcneil MRN: [id number] DOB/AGE: 84-09-1934 86 y.o.  Admit date: 10/26/2020 Discharge date: 11/02/2020  Admission Diagnoses: Intraductal Papillary Mucinous Neoplasm  Discharge Diagnoses:  Active Problems:   IPMN (intraductal papillary mucinous neoplasm)   Discharged Condition: good  Hospital Course: The patient was brought to the operating room and an open distal pancreatectomy was performed which the patient tolerated well without complications. The patient was admitted to the surgical service and was managed on the surgical floor. During the immediate post-operative period, his pain was managed with an epidural pain catheter and a fentanyl PCA. His diet was initially a clear liquid diet. PT was consulted to aid in mobilization and give discharge recommendations. The patient clinically progressed, was advanced to a full liquid then soft diet. When he was tolerating a soft diet his epidural was removed and he was transitioned to a PO pain regimen. His Foley catheter was removed and he was voiding spontaneously without issue at the time of discharge. He had return of bowel function the day before discharge with a bowel movement. He worked well with physical therapy and they recommended him for discharge to home with a rolling walker which he already owned.   On 11/02/20 the patient's pain was well controlled, he was tolerating a soft diet, he was voiding spontaneously, had bowel function, and was able to ambulate appropriately. He was deemed appropriate for discharge.   Consults: None  Significant Diagnostic Studies: labs: Daily CBC and chemistry  Treatments: IV hydration, antibiotics: Ancef, analgesia: acetaminophen and fentanyl, oxycodone, cardiac meds: ramipril (Altace) and metoprolol and labetolol, anticoagulation: heparin, insulin: Lantus and surgery: Distal pancreatectomy  Discharge Exam: Blood pressure (!) 159/64, pulse (!) 57,  temperature 97.9 F (36.6 C), temperature source Oral, resp. rate 18, height 5\' 8"  (1.727 m), weight 66.4 kg, SpO2 99 %. General appearance: alert, cooperative and no distress Resp: No increased work of breathing on RA Cardio: regular rate and rhythm GI: soft, mildly tender to palpation, non-distended, no rebound, drain without sersanguinous output in bag Neurologic: Grossly normal Incision/Wound: Incision c/d/i with small clot/scab at mid portion of incision   Disposition: Discharge disposition: 01-Home or Self Care       Discharge Instructions    Call MD for:  difficulty breathing, headache or visual disturbances   Complete by: As directed    Call MD for:  hives   Complete by: As directed    Call MD for:  persistant nausea and vomiting   Complete by: As directed    Call MD for:  redness, tenderness, or signs of infection (pain, swelling, redness, odor or green/yellow discharge around incision site)   Complete by: As directed    Call MD for:  severe uncontrolled pain   Complete by: As directed    Call MD for:  temperature >100.4   Complete by: As directed    Diet - low sodium heart healthy   Complete by: As directed    Increase activity slowly   Complete by: As directed      Allergies as of 11/02/2020      Reactions   Ciprofloxacin Itching   Codeine Nausea And Vomiting      Medication List    TAKE these medications   acetaminophen 500 MG tablet Commonly known as: TYLENOL Take 500 mg by mouth every 6 (six) hours as needed for moderate pain or headache.   aspirin EC 81 MG tablet Take 81 mg by mouth at bedtime.  B-12 1000 MCG Tabs Take 1,000 mcg by mouth daily. Notes to patient: Next dose : 11/03/2020 9am   B-D UF III MINI PEN NEEDLES 31G X 5 MM Misc Generic drug: Insulin Pen Needle 1 each by Other route 3 (three) times daily. E11.9   Basaglar KwikPen 100 UNIT/ML Inject 0.03 mLs (3 Units total) into the skin at bedtime. Notes to patient: Next dose :  11/02/2020 at bedtime   ferrous sulfate 325 (65 FE) MG tablet Take 1 tablet (325 mg total) by mouth 2 (two) times daily before a meal.   FreeStyle Libre 2 Sensor Misc 1 Device by Does not apply route every 14 (fourteen) days.   FREESTYLE LITE test strip Generic drug: glucose blood CHECK BLOOD SUGAR NO MORE THAN TWICE DAILY.   insulin lispro 100 UNIT/ML KwikPen Commonly known as: HumaLOG KwikPen 7 units with breakfast, 6 units with lunch, and 8 units with supper What changed:   how much to take  how to take this  when to take this  additional instructions   levothyroxine 100 MCG tablet Commonly known as: SYNTHROID Take 1 tablet (100 mcg total) by mouth daily before breakfast. Notes to patient: Next dose: 11/03/2020    metoprolol succinate 25 MG 24 hr tablet Commonly known as: TOPROL-XL Take 0.5 tablets (12.5 mg total) by mouth daily.   omeprazole 40 MG capsule Commonly known as: PRILOSEC Take 1 capsule (40 mg total) by mouth 2 (two) times daily before a meal. Notes to patient: Next dose: 11/02/2020 with dinner   ondansetron 4 MG disintegrating tablet Commonly known as: ZOFRAN-ODT Take 1 tablet (4 mg total) by mouth every 6 (six) hours as needed for nausea.   Pancrelipase (Lip-Prot-Amyl) 25000 units Cpep Commonly known as: Zenpep Take 2 with meals and snacks Notes to patient: Next dose : 11/02/2020 with dinner    ramipril 2.5 MG capsule Commonly known as: ALTACE Take 1 capsule (2.5 mg total) by mouth daily. Notes to patient: Next dose : 11/03/2020 9 am    simvastatin 20 MG tablet Commonly known as: ZOCOR Take 1 tablet (20 mg total) by mouth at bedtime. Notes to patient: Next dose : 11/02/2020 9 pm   traMADol 50 MG tablet Commonly known as: ULTRAM Take 1 tablet (50 mg total) by mouth every 6 (six) hours as needed for moderate pain.   Vitamin D3 50 MCG (2000 UT) capsule Take 2,000 Units by mouth daily. Notes to patient: Next dose: 11/03/2020 9 am         Follow-up Information    Stark Klein, MD Follow up in 2 week(s).   Specialty: General Surgery Contact information: [address] [place] [address] [phone]               Signed: Sheria Lang 11/03/2020, 8:33 AM

## 2020-11-21 ENCOUNTER — Other Ambulatory Visit: Payer: Self-pay

## 2020-11-21 MED ORDER — ZENPEP 25000-79000 UNITS PO CPEP: ORAL_CAPSULE | ORAL | 0 refills | Status: DC

## 2020-11-28 ENCOUNTER — Other Ambulatory Visit: Payer: Self-pay | Admitting: Internal Medicine

## 2020-12-26 DIAGNOSIS — N1832 Chronic kidney disease, stage 3b: Secondary | ICD-10-CM | POA: Diagnosis not present

## 2020-12-26 DIAGNOSIS — N183 Chronic kidney disease, stage 3 unspecified: Secondary | ICD-10-CM | POA: Diagnosis not present

## 2021-01-04 ENCOUNTER — Ambulatory Visit (INDEPENDENT_AMBULATORY_CARE_PROVIDER_SITE_OTHER): Payer: Medicare Other | Admitting: Internal Medicine

## 2021-01-04 ENCOUNTER — Other Ambulatory Visit: Payer: Self-pay

## 2021-01-04 ENCOUNTER — Encounter: Payer: Self-pay | Admitting: Internal Medicine

## 2021-01-04 VITALS — BP 180/80 | HR 55 | Temp 98.1°F | Resp 18 | Ht 70.0 in | Wt 144.1 lb

## 2021-01-04 DIAGNOSIS — K8689 Other specified diseases of pancreas: Secondary | ICD-10-CM | POA: Diagnosis not present

## 2021-01-04 DIAGNOSIS — E538 Deficiency of other specified B group vitamins: Secondary | ICD-10-CM

## 2021-01-04 DIAGNOSIS — E1165 Type 2 diabetes mellitus with hyperglycemia: Secondary | ICD-10-CM

## 2021-01-04 DIAGNOSIS — Z862 Personal history of diseases of the blood and blood-forming organs and certain disorders involving the immune mechanism: Secondary | ICD-10-CM | POA: Diagnosis not present

## 2021-01-04 DIAGNOSIS — I10 Essential (primary) hypertension: Secondary | ICD-10-CM | POA: Diagnosis not present

## 2021-01-04 DIAGNOSIS — E1151 Type 2 diabetes mellitus with diabetic peripheral angiopathy without gangrene: Secondary | ICD-10-CM | POA: Diagnosis not present

## 2021-01-04 DIAGNOSIS — E038 Other specified hypothyroidism: Secondary | ICD-10-CM | POA: Diagnosis not present

## 2021-01-04 DIAGNOSIS — IMO0002 Reserved for concepts with insufficient information to code with codable children: Secondary | ICD-10-CM

## 2021-01-04 DIAGNOSIS — E559 Vitamin D deficiency, unspecified: Secondary | ICD-10-CM | POA: Diagnosis not present

## 2021-01-04 LAB — COMPREHENSIVE METABOLIC PANEL
ALT: 15 U/L (ref 0–53)
AST: 20 U/L (ref 0–37)
Albumin: 3.5 g/dL (ref 3.5–5.2)
Alkaline Phosphatase: 68 U/L (ref 39–117)
BUN: 23 mg/dL (ref 6–23)
CO2: 30 mEq/L (ref 19–32)
Calcium: 9 mg/dL (ref 8.4–10.5)
Chloride: 103 mEq/L (ref 96–112)
Creatinine, Ser: 1.66 mg/dL — ABNORMAL HIGH (ref 0.40–1.50)
GFR: 37.64 mL/min — ABNORMAL LOW (ref >60.00)
Glucose, Bld: 172 mg/dL — ABNORMAL HIGH (ref 70–99)
Potassium: 4.4 mEq/L (ref 3.5–5.1)
Sodium: 138 mEq/L (ref 135–145)
Total Bilirubin: 0.3 mg/dL (ref 0.2–1.2)
Total Protein: 6 g/dL (ref 6.0–8.3)

## 2021-01-04 LAB — B12 AND FOLATE PANEL
Folate: 18.9 ng/mL (ref >5.9)
Vitamin B-12: 834 pg/mL (ref 211–911)

## 2021-01-04 LAB — VITAMIN D 25 HYDROXY (VIT D DEFICIENCY, FRACTURES): VITD: 19.48 ng/mL — ABNORMAL LOW (ref 30.00–100.00)

## 2021-01-04 LAB — CBC WITH DIFFERENTIAL/PLATELET
Basophils Absolute: 0.1 10*3/uL (ref 0.0–0.1)
Basophils Relative: 0.7 % (ref 0.0–3.0)
Eosinophils Absolute: 1.3 10*3/uL — ABNORMAL HIGH (ref 0.0–0.7)
Eosinophils Relative: 14.3 % — ABNORMAL HIGH (ref 0.0–5.0)
HCT: 31.8 % — ABNORMAL LOW (ref 39.0–52.0)
Hemoglobin: 10.4 g/dL — ABNORMAL LOW (ref 13.0–17.0)
Lymphocytes Relative: 16.8 % (ref 12.0–46.0)
Lymphs Abs: 1.5 10*3/uL (ref 0.7–4.0)
MCHC: 32.8 g/dL (ref 30.0–36.0)
MCV: 94.3 fl (ref 78.0–100.0)
Monocytes Absolute: 1.1 10*3/uL — ABNORMAL HIGH (ref 0.1–1.0)
Monocytes Relative: 12.1 % — ABNORMAL HIGH (ref 3.0–12.0)
Neutro Abs: 5 10*3/uL (ref 1.4–7.7)
Neutrophils Relative %: 56.1 % (ref 43.0–77.0)
Platelets: 264 10*3/uL (ref 150.0–400.0)
RBC: 3.37 Mil/uL — ABNORMAL LOW (ref 4.22–5.81)
RDW: 14.3 % (ref 11.5–15.5)
WBC: 8.8 10*3/uL (ref 4.0–10.5)

## 2021-01-04 LAB — IBC + FERRITIN
Ferritin: 56.3 ng/mL (ref 22.0–322.0)
Iron: 47 ug/dL (ref 42–165)
Saturation Ratios: 18 % — ABNORMAL LOW (ref 20.0–50.0)
Transferrin: 186 mg/dL — ABNORMAL LOW (ref 212.0–360.0)

## 2021-01-04 LAB — TSH: TSH: 4.74 u[IU]/mL — ABNORMAL HIGH (ref 0.35–4.50)

## 2021-01-04 LAB — TRANSFERRIN: Transferrin: 186 mg/dL — ABNORMAL LOW (ref 212.0–360.0)

## 2021-01-04 MED ORDER — LEVOTHYROXINE SODIUM 112 MCG PO TABS: 112.0000 ug | ORAL_TABLET | Freq: Every day | ORAL | 2 refills | Status: DC

## 2021-01-04 MED ORDER — VITAMIN D (ERGOCALCIFEROL) 1.25 MG (50000 UNIT) PO CAPS: 50000.0000 [IU] | ORAL_CAPSULE | ORAL | 0 refills | Status: DC

## 2021-01-04 MED ORDER — PANTOPRAZOLE SODIUM 40 MG PO TBEC: 40.0000 mg | DELAYED_RELEASE_TABLET | Freq: Every day | ORAL | 3 refills | Status: DC

## 2021-01-04 NOTE — Patient Instructions (Addendum)
Per our records you are due for an eye exam. Please contact your eye doctor to schedule an appointment. Please have them send copies of your office visit notes to [place]. Our fax number is [phone].  Your blood pressure is elevated today.  Please check your blood pressure daily for the next 5 days, call with your readings next week. BP GOAL is between 110/65 and  135/85.  GO TO THE LAB : Get the blood work     [name], [place] back for a checkup in 4 months

## 2021-01-04 NOTE — Assessment & Plan Note (Deleted)
DM: To see Endo soon, denies any low blood sugars. HTN: On metoprolol and Altace, ambulatory BPs reportedly 120, 140.  BP today is elevated, repeated: 180/80.  No symptoms.  We will check a CMP, request patient to check BPs daily and call next week. 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 pancreatic insufficiency: He had a open partial pancreatectomy 10-2020, he seems to be recovering well. Postop developed diarrhea, was prescribed Zenpep 2 tablets with meals, then developed constipation, now taking 1 tablet with meals and bowel movements are very good.  We will continue with 1 tablet with meals.  We will see surgery soon. Anemia: Currently not taking iron, checking labs. Vitamin D and B12 deficiency: On oral supplements, checking labs. Preventive care: Had COVID-vaccine x3 and a flu shot. Gastric ulcer: Per EGD 07-2020, currently asymptomatic, has stopped PPIs.  (Addendum, communicated with GI, they recommend PPIs indefinitely, will send pantoprazole) RTC 4 months

## 2021-01-04 NOTE — Assessment & Plan Note (Signed)
DM: To see Endo soon, denies any low blood sugars. HTN: On metoprolol and Altace, ambulatory BPs reportedly 120, 140.  BP today is elevated, repeated: 180/80.  No symptoms.  We will check a CMP, request patient to check BPs daily and call next week. 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 pancreatic insufficiency: He had a open partial pancreatectomy 10-2020, he seems to be recovering well. Postop developed diarrhea, was prescribed Zenpep 2 tablets with meals, then developed constipation, now taking 1 tablet with meals and bowel movements are very good.  We will continue with 1 tablet with meals.  We will see surgery soon. Anemia: Currently not taking iron, checking labs. Vitamin D and B12 deficiency: On oral supplements, checking labs. Preventive care: Had COVID-vaccine x3 and a flu shot. Gastric ulcer: Per EGD 07-2020, currently asymptomatic, has stopped PPIs.  (Addendum, communicated with GI, they recommend PPIs indefinitely, will send pantoprazole) RTC 4 months

## 2021-01-04 NOTE — Progress Notes (Signed)
Pre visit review using our clinic review tool, if applicable. No additional management support is needed unless otherwise documented below in the visit note. 

## 2021-01-04 NOTE — Progress Notes (Signed)
Subjective:    Patient ID: Ethan Mcneil, male    DOB: 1934-10-02, 87 y.o.   MRN: [id number]  DOS:  01/04/2021 Type of visit - description: Follow-up Since the last office visit, had a open distal pancreatectomy, he is recovering well. Denies fever chills. Weight is stable No chest pain no difficulty breathing. Bowel movements currently normal. No cough Ambulatory BPs reportedly very good.   Wt Readings from Last 3 Encounters:  01/04/21 144 lb 2 oz (65.4 kg)  10/26/20 146 lb 6.2 oz (66.4 kg)  10/23/20 146 lb 6 oz (66.4 kg)     Review of Systems See above   Past Medical History:  Diagnosis Date  . Anemia   . Arthritis   . CAD (coronary artery disease)    CABG 1997; grafts patent @ cath [facility] 11/09  . DM2 (diabetes mellitus, type 2) ([place])   . Gout    after tick bite  . History of kidney stones   . HLD (hyperlipidemia)   . HTN (hypertension)    essential nos  . Hypothyroid   . Pancreatic lesion   . Postsurgical aortocoronary bypass status   . PVD (peripheral vascular disease) ([place])   . S/P herniorrhaphy   . Tick fever 2012   [facility] , [place]  . Unspecified hyperplasia of prostate without urinary obstruction and other lower urinary tract symptoms (LUTS)   . Urolithiasis 2015   hx of  . Wears dentures   . Wears glasses     Past Surgical History:  Procedure Laterality Date  . BIOPSY  08/07/2020   Procedure: BIOPSY;  Surgeon: Rush Landmark Telford Nab., MD;  Location: [place];  Service: Gastroenterology;;  . CORONARY ARTERY BYPASS GRAFT     x7 vessels 1997; cath 2002 and 2009; grafts patent   . CYSTOSCOPY WITH RETROGRADE PYELOGRAM, URETEROSCOPY AND STENT PLACEMENT Right 02/21/2014   Procedure: CYSTOSCOPY WITH RIGHT  RETROGRADE PYELOGRAM,RIGHT  URETEROSCOPY WITH STONE BASKETING EXTRACTION;  Surgeon: Irine Seal, MD;  [facility]: [facility];  Service: Urology;  Laterality: Right;  . ESOPHAGOGASTRODUODENOSCOPY (EGD) WITH PROPOFOL N/A 08/07/2020   Procedure:  ESOPHAGOGASTRODUODENOSCOPY (EGD) WITH PROPOFOL;  Surgeon: Rush Landmark Telford Nab., MD;  Location: [place];  Service: Gastroenterology;  Laterality: N/A;  . EUS N/A 08/07/2020   Procedure: UPPER ENDOSCOPIC ULTRASOUND (EUS) LINEAR;  Surgeon: Irving Copas., MD;  Location: [place];  Service: Gastroenterology;  Laterality: N/A;  . FINE NEEDLE ASPIRATION N/A 08/07/2020   Procedure: FINE NEEDLE ASPIRATION (FNA) LINEAR;  Surgeon: Irving Copas., MD;  Location: [place];  Service: Gastroenterology;  Laterality: N/A;  . HERNIA REPAIR    . INGUINAL HERNIA REPAIR    . MULTIPLE TOOTH EXTRACTIONS    . SHOULDER SURGERY    . SPLENECTOMY, TOTAL N/A 10/26/2020   Procedure: SPLENECTOMY;  Surgeon: Stark Klein, MD;  Location: [place];  Service: General;  Laterality: N/A;    Allergies as of 01/04/2021      Reactions   Ciprofloxacin Itching   Codeine Nausea And Vomiting      Medication List       Accurate as of January 04, 2021  5:07 PM. If you have any questions, ask your nurse or doctor.        STOP taking these medications   ferrous sulfate 325 (65 FE) MG tablet Stopped by: Kathlene November, MD   omeprazole 40 MG capsule Commonly known as: PRILOSEC Stopped by: Kathlene November, MD   traMADol 50 MG tablet Commonly known as: Veatrice Bourbon  Stopped by: Kathlene November, MD     TAKE these medications   acetaminophen 500 MG tablet Commonly known as: TYLENOL Take 500 mg by mouth every 6 (six) hours as needed for moderate pain or headache.   aspirin EC 81 MG tablet Take 81 mg by mouth at bedtime.   B-12 1000 MCG Tabs Take 1,000 mcg by mouth daily.   B-D UF III MINI PEN NEEDLES 31G X 5 MM Misc Generic drug: Insulin Pen Needle 1 each by Other route 3 (three) times daily. E11.9   Basaglar KwikPen 100 UNIT/ML Inject 0.03 mLs (3 Units total) into the skin at bedtime.   FreeStyle Libre 2 Sensor Misc 1 Device by Does not apply route every 14 (fourteen) days.   FREESTYLE LITE test  strip Generic drug: glucose blood CHECK BLOOD SUGAR NO MORE THAN TWICE DAILY.   insulin lispro 100 UNIT/ML KwikPen Commonly known as: HumaLOG KwikPen 7 units with breakfast, 6 units with lunch, and 8 units with supper What changed:   how much to take  how to take this  when to take this  additional instructions   levothyroxine 112 MCG tablet Commonly known as: SYNTHROID Take 1 tablet (112 mcg total) by mouth daily before lunch. What changed:   medication strength  how much to take  when to take this Changed by: Kathlene November, MD   metoprolol succinate 25 MG 24 hr tablet Commonly known as: TOPROL-XL Take 0.5 tablets (12.5 mg total) by mouth daily.   ondansetron 4 MG disintegrating tablet Commonly known as: ZOFRAN-ODT Take 1 tablet (4 mg total) by mouth every 6 (six) hours as needed for nausea.   pantoprazole 40 MG tablet Commonly known as: PROTONIX Take 1 tablet (40 mg total) by mouth daily. Started by: Kathlene November, MD   ramipril 2.5 MG capsule Commonly known as: ALTACE Take 1 capsule (2.5 mg total) by mouth daily.   simvastatin 20 MG tablet Commonly known as: ZOCOR Take 1 tablet (20 mg total) by mouth at bedtime.   Vitamin D (Ergocalciferol) 1.25 MG (50000 UNIT) Caps capsule Commonly known as: DRISDOL Take 1 capsule (50,000 Units total) by mouth every 7 (seven) days. Started by: Kathlene November, MD   Vitamin D3 50 MCG (2000 UT) capsule Take 2,000 Units by mouth daily.   Zenpep 25000-79000 units Cpep Generic drug: Pancrelipase (Lip-Prot-Amyl) Take 2 capsules by mouth with every meal and snacks What changed: additional instructions          Objective:   Physical Exam BP (!) 180/80   Pulse (!) 55   Temp 98.1 F (36.7 C) (Oral)   Resp 18   Ht 5\' 10"  (1.778 m)   Wt 144 lb 2 oz (65.4 kg)   SpO2 98%   BMI 20.68 kg/m  General:   Well developed, NAD, BMI noted.  HEENT:  Normocephalic . Face symmetric, atraumatic Lungs:  CTA B Normal respiratory effort, no  intercostal retractions, no accessory muscle use. Heart: bradycardic,  no murmur.  Abdomen:  Not distended, soft, non-tender. No rebound or rigidity.   Skin: Not pale. Not jaundice Lower extremities: + Left leg swelling, at baseline. Neurologic:  alert & oriented X3.  Speech normal, gait appropriate for age and unassisted Psych--  Cognition and judgment appear intact.  Cooperative with normal attention span and concentration.  Behavior appropriate. No anxious or depressed appearing.     Assessment     Assessment DM -- w/ CAD, no neuropathy, intolerant to metformin , sees ENDO HTN  Hyperlipidemia Hypothyroidism DJD BPH, LUTS,h/o urolithiasis -- sees urology CV: --CAD, CABG 1997, cardiac cath 2009 --Peripheral vascular disease (no ABIs in the chart) --L leg edema, chronic, (-) DVT per [place] 2015 Gout GI: Pancreatic insufficiency dx 02-2017 Anemia: Mild, normal iron 2015 B12 deficiency, mild 2015  Wt loss:  2012 187 lb, 2014 173 lb , 2015 167 01-2017: EGD normal, BX chronic active gastritis. Colonoscopy normal. Saw GI 02-2017: likely d/t Pancreatic insufficiency and poorly controlled diabetes. 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  Surgeries 10-2020  PLAN: DM: To see Endo soon, denies any low blood sugars. HTN: On metoprolol and Altace, ambulatory BPs reportedly 120, 140.  BP today is elevated, repeated: 180/80.  No symptoms.  We will check a CMP, request patient to check BPs daily and call next week. 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 pancreatic insufficiency: He had a open partial pancreatectomy 10-2020, he seems to be recovering well. Postop developed diarrhea, was prescribed Zenpep 2 tablets with meals, then developed constipation, now taking 1 tablet with  meals and bowel movements are very good.  We will continue with 1 tablet with meals.  We will see surgery soon. Anemia: Currently not taking iron, checking labs. Vitamin D and B12 deficiency: On oral supplements, checking labs. Preventive care: Had COVID-vaccine x3 and a flu shot. Gastric ulcer: Per EGD 07-2020, currently asymptomatic, has stopped PPIs.  (Addendum, communicated with GI, they recommend PPIs indefinitely, will send pantoprazole) RTC 4 months  This visit occurred during the SARS-CoV-2 public health emergency.  Safety protocols were in place, including screening questions prior to the visit, additional usage of staff PPE, and extensive cleaning of exam room while observing appropriate contact time as indicated for disinfecting solutions.

## 2021-01-08 ENCOUNTER — Other Ambulatory Visit: Payer: Self-pay

## 2021-01-08 ENCOUNTER — Ambulatory Visit (INDEPENDENT_AMBULATORY_CARE_PROVIDER_SITE_OTHER): Payer: Medicare Other | Admitting: Endocrinology

## 2021-01-08 VITALS — BP 194/80 | HR 51 | Ht 70.0 in | Wt 149.4 lb

## 2021-01-08 DIAGNOSIS — E1151 Type 2 diabetes mellitus with diabetic peripheral angiopathy without gangrene: Secondary | ICD-10-CM

## 2021-01-08 DIAGNOSIS — E1165 Type 2 diabetes mellitus with hyperglycemia: Secondary | ICD-10-CM

## 2021-01-08 DIAGNOSIS — IMO0002 Reserved for concepts with insufficient information to code with codable children: Secondary | ICD-10-CM

## 2021-01-08 LAB — POCT GLYCOSYLATED HEMOGLOBIN (HGB A1C): Hemoglobin A1C: 7.1 % — AB (ref 4.0–5.6)

## 2021-01-08 MED ORDER — INSULIN LISPRO (1 UNIT DIAL) 100 UNIT/ML (KWIKPEN)
PEN_INJECTOR | SUBCUTANEOUS | 11 refills | Status: DC
Start: 2021-01-08 — End: 2021-04-17

## 2021-01-08 NOTE — Patient Instructions (Addendum)
Your blood pressure is high today.  Please see your primary care provider soon, to have it rechecked. check your blood sugar 4 times a day.  vary the time of day when you check, between before the 3 meals, and at bedtime.  also check if you have symptoms of your blood sugar being too high or too low.  please keep a record of the readings and bring it to your next appointment here (or you can bring the meter itself).  You can write it on any piece of paper.  please call us sooner if your blood sugar goes below 70, or if you have a lot of readings over 200.   Please reduce the humalog to 6 units with breakfast, 7 units with lunch, and 10 units with supper Please continue the same [place].   Please come back for a follow-up appointment in 3 months.

## 2021-01-08 NOTE — Progress Notes (Signed)
Subjective:    Patient ID: Ethan Mcneil, male    DOB: [birth year], 87 y.o.   MRN: [id number]  HPI Pt returns for f/u of diabetes mellitus: DM type: pancreatic insuff. Dx'ed: 5732 Complications: PN, CRI, CAD, and PAD.   Therapy: insulin since 2017 DKA: never Severe hypoglycemia: once (2021) Pancreatitis: once (2017) Pancreatic imaging: chronic atrophy with diffuse pancreatic duct dilatation.   SDOH: frail elderly state Other: he takes multiple daily injections; he declines pump; he has distal pancreatectomy in 2021.   Interval history: I reviewed continuous glucose monitor data.  glusose varies from 60-310.  It is in general lowest at 12N and 9PMNo recent steroids.  He had 1 episode of severe hypoglycemia prior to 11/21 surgery.  He says humalog to 3 times a day (just before each meal) 06-20-10 units.   Past Medical History:  Diagnosis Date  . Anemia   . Arthritis   . CAD (coronary artery disease)    CABG 1997; grafts patent @ cath [facility] 11/09  . DM2 (diabetes mellitus, type 2) ([place])   . Gout    after tick bite  . History of kidney stones   . HLD (hyperlipidemia)   . HTN (hypertension)    essential nos  . Hypothyroid   . Pancreatic lesion   . Postsurgical aortocoronary bypass status   . PVD (peripheral vascular disease) ([place])   . S/P herniorrhaphy   . Tick fever 2012   [facility] , [place]  . Unspecified hyperplasia of prostate without urinary obstruction and other lower urinary tract symptoms (LUTS)   . Urolithiasis 2015   hx of  . Wears dentures   . Wears glasses     Past Surgical History:  Procedure Laterality Date  . BIOPSY  08/07/2020   Procedure: BIOPSY;  Surgeon: Rush Landmark Telford Nab., MD;  Location: [place];  Service: Gastroenterology;;  . CORONARY ARTERY BYPASS GRAFT     x7 vessels 1997; cath 2002 and 2009; grafts patent   . CYSTOSCOPY WITH RETROGRADE PYELOGRAM, URETEROSCOPY AND STENT PLACEMENT Right 02/21/2014   Procedure: CYSTOSCOPY WITH  RIGHT  RETROGRADE PYELOGRAM,RIGHT  URETEROSCOPY WITH STONE BASKETING EXTRACTION;  Surgeon: Irine Seal, MD;  [facility];  Service: Urology;  Laterality: Right;  . ESOPHAGOGASTRODUODENOSCOPY (EGD) WITH PROPOFOL N/A 08/07/2020   Procedure: ESOPHAGOGASTRODUODENOSCOPY (EGD) WITH PROPOFOL;  Surgeon: Rush Landmark Telford Nab., MD;  Location: [place];  Service: Gastroenterology;  Laterality: N/A;  . EUS N/A 08/07/2020   Procedure: UPPER ENDOSCOPIC ULTRASOUND (EUS) LINEAR;  Surgeon: Irving Copas., MD;  Location: [place];  Service: Gastroenterology;  Laterality: N/A;  . FINE NEEDLE ASPIRATION N/A 08/07/2020   Procedure: FINE NEEDLE ASPIRATION (FNA) LINEAR;  Surgeon: Irving Copas., MD;  Location: [place];  Service: Gastroenterology;  Laterality: N/A;  . HERNIA REPAIR    . INGUINAL HERNIA REPAIR    . MULTIPLE TOOTH EXTRACTIONS    . SHOULDER SURGERY    . SPLENECTOMY, TOTAL N/A 10/26/2020   Procedure: SPLENECTOMY;  Surgeon: Stark Klein, MD;  Location: [place];  Service: General;  Laterality: N/A;    Social History   Socioeconomic History  . Marital status: Married    Spouse name: [name]  . Number of children: 4  . Years of education: Not on file  . Highest education level: Not on file  Occupational History  . Occupation: retired, works few hours   Tobacco Use  . Smoking status: Never Smoker  . Smokeless tobacco: Never Used  Vaping Use  .  Vaping Use: Never used  Substance and Sexual Activity  . Alcohol use: No  . Drug use: No  . Sexual activity: Not Currently  Other Topics Concern  . Not on file  Social History Narrative   Buyer, retail and former race car driver   4 children (boys)   Designated party form signed appointing wife - [name]; ok to leave msg on home phone [phone]         Social Determinants of Health   Financial Resource Strain: Low Risk   . Difficulty of Paying Living Expenses: Not hard at all  Food Insecurity: No Food  Insecurity  . Worried About Charity fundraiser in the Last Year: Never true  . Ran Out of Food in the Last Year: Never true  Transportation Needs: No Transportation Needs  . Lack of Transportation (Medical): No  . Lack of Transportation (Non-Medical): No  Physical Activity: Not on file  Stress: Not on file  Social Connections: Not on file  Intimate Partner Violence: Not on file    Current Outpatient Medications on File Prior to Visit  Medication Sig Dispense Refill  . acetaminophen (TYLENOL) 500 MG tablet Take 500 mg by mouth every 6 (six) hours as needed for moderate pain or headache.    Marland Kitchen aspirin EC 81 MG tablet Take 81 mg by mouth at bedtime.     . Cholecalciferol (VITAMIN D3) 50 MCG (2000 UT) capsule Take 2,000 Units by mouth daily.    . Continuous Blood Gluc Sensor (FREESTYLE LIBRE 2 SENSOR) MISC 1 Device by Does not apply route every 14 (fourteen) days. 6 each 3  . Cyanocobalamin (B-12) 1000 MCG TABS Take 1,000 mcg by mouth daily.    Marland Kitchen glucose blood (FREESTYLE LITE) test strip CHECK BLOOD SUGAR NO MORE THAN TWICE DAILY. 200 strip 0  . Insulin Glargine (BASAGLAR KWIKPEN) 100 UNIT/ML Inject 0.03 mLs (3 Units total) into the skin at bedtime. 5 pen 11  . Insulin Pen Needle (B-D UF III MINI PEN NEEDLES) 31G X 5 MM MISC 1 each by Other route 3 (three) times daily. E11.9 90 each 2  . levothyroxine (SYNTHROID) 112 MCG tablet Take 1 tablet (112 mcg total) by mouth daily before lunch. 30 tablet 2  . metoprolol succinate (TOPROL-XL) 25 MG 24 hr tablet Take 0.5 tablets (12.5 mg total) by mouth daily. 90 tablet 1  . ondansetron (ZOFRAN-ODT) 4 MG disintegrating tablet Take 1 tablet (4 mg total) by mouth every 6 (six) hours as needed for nausea. 20 tablet 0  . Pancrelipase, Lip-Prot-Amyl, (ZENPEP) 25000-79000 units CPEP Take 2 capsules by mouth with every meal and snacks    . pantoprazole (PROTONIX) 40 MG tablet Take 1 tablet (40 mg total) by mouth daily. 90 tablet 3  . ramipril (ALTACE) 2.5 MG  capsule Take 1 capsule (2.5 mg total) by mouth daily. 90 capsule 1  . simvastatin (ZOCOR) 20 MG tablet Take 1 tablet (20 mg total) by mouth at bedtime. 90 tablet 1  . Vitamin D, Ergocalciferol, (DRISDOL) 1.25 MG (50000 UNIT) CAPS capsule Take 1 capsule (50,000 Units total) by mouth every 7 (seven) days. 12 capsule 0   No current facility-administered medications on file prior to visit.    Allergies  Allergen Reactions  . Ciprofloxacin Itching  . Codeine Nausea And Vomiting    Family History  Problem Relation Age of Onset  . Lung cancer Father   . Coronary artery disease Mother        stent  .  Diabetes Brother   . Prostate cancer Maternal Uncle        in his 54s  . Stroke Son        GF  . Throat cancer Son   . Colon cancer Neg Hx   . Rectal cancer Neg Hx   . Stomach cancer Neg Hx   . Esophageal cancer Neg Hx   . Pancreatic cancer Neg Hx     BP (!) 194/80 (BP Location: Right Arm, Patient Position: Sitting, Cuff Size: Normal)   Pulse (!) 51   Ht 5\' 10"  (1.778 m)   Wt 149 lb 6.4 oz (67.8 kg)   SpO2 98%   BMI 21.44 kg/m   Review of Systems     Objective:   Physical Exam VITAL SIGNS:  See vs page GENERAL: no distress Pulses: dorsalis pedis intact bilat.   MSK: no deformity of the feet CV: 2+ left, and 1+ right leg edema Skin:  no ulcer on the feet.  normal color and temp on the feet. Neuro: sensation is intact to touch on the feet.     Lab Results  Component Value Date   HGBA1C 7.1 (A) 01/08/2021       Assessment & Plan:  HTN: is noted today DM, due to panc insuff, with PAD. Hypoglycemia, due to insulin  Patient Instructions  Your blood pressure is high today.  Please see your primary care provider soon, to have it rechecked. check your blood sugar 4 times a day.  vary the time of day when you check, between before the 3 meals, and at bedtime.  also check if you have symptoms of your blood sugar being too high or too low.  please keep a record of the  readings and bring it to your next appointment here (or you can bring the meter itself).  You can write it on any piece of paper.  please call us sooner if your blood sugar goes below 70, or if you have a lot of readings over 200.   Please reduce the humalog to 6 units with breakfast, 7 units with lunch, and 10 units with supper Please continue the same [place].   Please come back for a follow-up appointment in 3 months.

## 2021-02-15 ENCOUNTER — Other Ambulatory Visit: Payer: Medicare Other

## 2021-02-15 ENCOUNTER — Encounter: Payer: Self-pay | Admitting: Internal Medicine

## 2021-02-15 DIAGNOSIS — H2513 Age-related nuclear cataract, bilateral: Secondary | ICD-10-CM | POA: Diagnosis not present

## 2021-02-15 DIAGNOSIS — E119 Type 2 diabetes mellitus without complications: Secondary | ICD-10-CM | POA: Diagnosis not present

## 2021-02-15 DIAGNOSIS — Z794 Long term (current) use of insulin: Secondary | ICD-10-CM | POA: Diagnosis not present

## 2021-02-15 LAB — HM DIABETES EYE EXAM

## 2021-02-16 ENCOUNTER — Other Ambulatory Visit: Payer: Self-pay

## 2021-02-16 ENCOUNTER — Other Ambulatory Visit (INDEPENDENT_AMBULATORY_CARE_PROVIDER_SITE_OTHER): Payer: Medicare Other

## 2021-02-16 DIAGNOSIS — E038 Other specified hypothyroidism: Secondary | ICD-10-CM

## 2021-02-16 LAB — TSH: TSH: 1.64 u[IU]/mL (ref 0.35–4.50)

## 2021-02-16 MED ORDER — LEVOTHYROXINE SODIUM 112 MCG PO TABS
112.0000 ug | ORAL_TABLET | Freq: Every day | ORAL | 1 refills | Status: DC
Start: 2021-02-16 — End: 2021-09-27

## 2021-02-25 ENCOUNTER — Other Ambulatory Visit: Payer: Self-pay | Admitting: Gastroenterology

## 2021-03-06 ENCOUNTER — Encounter: Payer: Self-pay | Admitting: Internal Medicine

## 2021-03-13 ENCOUNTER — Other Ambulatory Visit: Payer: Self-pay | Admitting: Internal Medicine

## 2021-04-17 ENCOUNTER — Other Ambulatory Visit: Payer: Self-pay

## 2021-04-17 ENCOUNTER — Ambulatory Visit (INDEPENDENT_AMBULATORY_CARE_PROVIDER_SITE_OTHER): Payer: Medicare Other | Admitting: Endocrinology

## 2021-04-17 VITALS — BP 206/80 | HR 50 | Ht 70.0 in | Wt 161.6 lb

## 2021-04-17 DIAGNOSIS — E1151 Type 2 diabetes mellitus with diabetic peripheral angiopathy without gangrene: Secondary | ICD-10-CM | POA: Diagnosis not present

## 2021-04-17 DIAGNOSIS — E1165 Type 2 diabetes mellitus with hyperglycemia: Secondary | ICD-10-CM

## 2021-04-17 DIAGNOSIS — IMO0002 Reserved for concepts with insufficient information to code with codable children: Secondary | ICD-10-CM

## 2021-04-17 LAB — POCT GLYCOSYLATED HEMOGLOBIN (HGB A1C): Hemoglobin A1C: 6.6 % — AB (ref 4.0–5.6)

## 2021-04-17 MED ORDER — INSULIN LISPRO (1 UNIT DIAL) 100 UNIT/ML (KWIKPEN)
PEN_INJECTOR | SUBCUTANEOUS | 11 refills | Status: DC
Start: 2021-04-17 — End: 2021-05-25

## 2021-04-17 NOTE — Progress Notes (Signed)
Subjective:    Patient ID: Ethan Mcneil, male    DOB: 11-20-1934, 87 y.o.   MRN: [id number]  HPI Pt returns for f/u of diabetes mellitus: DM type: pancreatic insuff. Dx'ed: 3810 Complications: PN, CRI, CAD, and PAD.   Therapy: insulin since 2017 DKA: never Severe hypoglycemia: once (2021) Pancreatitis: once (2017) Pancreatic imaging: chronic atrophy with diffuse pancreatic duct dilatation.   SDOH: frail elderly state Other: he takes multiple daily injections; he declines pump; he had distal pancreatectomy in 2021.   Interval history: I reviewed continuous glucose monitor data.  glusose varies from 50-250.  It is in general lowest 3PM-9PM.  Otherwise, There is no trend throughout the day.  No recent steroids.  He seldom has hypoglycemia, and these episodes are mild.  He says basaglar is 4 units qhs.   Past Medical History:  Diagnosis Date  . Anemia   . Arthritis   . CAD (coronary artery disease)    CABG 1997; grafts patent @ cath [facility] 11/09  . DM2 (diabetes mellitus, type 2) ([place])   . Gout    after tick bite  . History of kidney stones   . HLD (hyperlipidemia)   . HTN (hypertension)    essential nos  . Hypothyroid   . Pancreatic lesion   . Postsurgical aortocoronary bypass status   . PVD (peripheral vascular disease) ([place])   . S/P herniorrhaphy   . Tick fever 2012   [facility] , [place]  . Unspecified hyperplasia of prostate without urinary obstruction and other lower urinary tract symptoms (LUTS)   . Urolithiasis 2015   hx of  . Wears dentures   . Wears glasses     Past Surgical History:  Procedure Laterality Date  . BIOPSY  08/07/2020   Procedure: BIOPSY;  Surgeon: Rush Landmark Telford Nab., MD;  Location: [place];  Service: Gastroenterology;;  . CORONARY ARTERY BYPASS GRAFT     x7 vessels 1997; cath 2002 and 2009; grafts patent   . CYSTOSCOPY WITH RETROGRADE PYELOGRAM, URETEROSCOPY AND STENT PLACEMENT Right 02/21/2014   Procedure: CYSTOSCOPY WITH  RIGHT  RETROGRADE PYELOGRAM,RIGHT  URETEROSCOPY WITH STONE BASKETING EXTRACTION;  Surgeon: Irine Seal, MD;  [facility]: [facility];  Service: Urology;  Laterality: Right;  . ESOPHAGOGASTRODUODENOSCOPY (EGD) WITH PROPOFOL N/A 08/07/2020   Procedure: ESOPHAGOGASTRODUODENOSCOPY (EGD) WITH PROPOFOL;  Surgeon: Rush Landmark Telford Nab., MD;  Location: [place];  Service: Gastroenterology;  Laterality: N/A;  . EUS N/A 08/07/2020   Procedure: UPPER ENDOSCOPIC ULTRASOUND (EUS) LINEAR;  Surgeon: Irving Copas., MD;  Location: [place];  Service: Gastroenterology;  Laterality: N/A;  . FINE NEEDLE ASPIRATION N/A 08/07/2020   Procedure: FINE NEEDLE ASPIRATION (FNA) LINEAR;  Surgeon: Irving Copas., MD;  Location: [place];  Service: Gastroenterology;  Laterality: N/A;  . HERNIA REPAIR    . INGUINAL HERNIA REPAIR    . MULTIPLE TOOTH EXTRACTIONS    . SHOULDER SURGERY    . SPLENECTOMY, TOTAL N/A 10/26/2020   Procedure: SPLENECTOMY;  Surgeon: Stark Klein, MD;  Location: [place];  Service: General;  Laterality: N/A;    Social History   Socioeconomic History  . Marital status: Married    Spouse name: [name]  . Number of children: 4  . Years of education: Not on file  . Highest education level: Not on file  Occupational History  . Occupation: retired, works few hours   Tobacco Use  . Smoking status: Never Smoker  . Smokeless tobacco: Never Used  Vaping Use  . Vaping  Use: Never used  Substance and Sexual Activity  . Alcohol use: No  . Drug use: No  . Sexual activity: Not Currently  Other Topics Concern  . Not on file  Social History Narrative   Buyer, retail and former race car driver   4 children (boys)   Designated party form signed appointing wife - [name]; ok to leave msg on home phone [phone]         Social Determinants of Health   Financial Resource Strain: Low Risk   . Difficulty of Paying Living Expenses: Not hard at all  Food Insecurity: No Food  Insecurity  . Worried About Charity fundraiser in the Last Year: Never true  . Ran Out of Food in the Last Year: Never true  Transportation Needs: No Transportation Needs  . Lack of Transportation (Medical): No  . Lack of Transportation (Non-Medical): No  Physical Activity: Not on file  Stress: Not on file  Social Connections: Not on file  Intimate Partner Violence: Not on file    Current Outpatient Medications on File Prior to Visit  Medication Sig Dispense Refill  . acetaminophen (TYLENOL) 500 MG tablet Take 500 mg by mouth every 6 (six) hours as needed for moderate pain or headache.    Marland Kitchen aspirin EC 81 MG tablet Take 81 mg by mouth at bedtime.     . Cholecalciferol (VITAMIN D3) 50 MCG (2000 UT) capsule Take 2,000 Units by mouth daily.    . Continuous Blood Gluc Sensor (FREESTYLE LIBRE 2 SENSOR) MISC 1 Device by Does not apply route every 14 (fourteen) days. 6 each 3  . Cyanocobalamin (B-12) 1000 MCG TABS Take 1,000 mcg by mouth daily.    Marland Kitchen glucose blood (FREESTYLE LITE) test strip CHECK BLOOD SUGAR NO MORE THAN TWICE DAILY. 200 strip 0  . Insulin Glargine (BASAGLAR KWIKPEN) 100 UNIT/ML Inject 0.03 mLs (3 Units total) into the skin at bedtime. 5 pen 11  . Insulin Pen Needle (B-D UF III MINI PEN NEEDLES) 31G X 5 MM MISC 1 each by Other route 3 (three) times daily. E11.9 90 each 2  . levothyroxine (SYNTHROID) 112 MCG tablet Take 1 tablet (112 mcg total) by mouth daily before lunch. 90 tablet 1  . Pancrelipase, Lip-Prot-Amyl, (ZENPEP) 25000-79000 units CPEP Take 2 capsules by mouth with every meal and snacks 540 capsule 1  . simvastatin (ZOCOR) 20 MG tablet Take 1 tablet (20 mg total) by mouth at bedtime. 90 tablet 1   No current facility-administered medications on file prior to visit.    Allergies  Allergen Reactions  . Ciprofloxacin Itching  . Codeine Nausea And Vomiting    Family History  Problem Relation Age of Onset  . Lung cancer Father   . Coronary artery disease Mother         stent  . Diabetes Brother   . Prostate cancer Maternal Uncle        in his 28s  . Stroke Son        GF  . Throat cancer Son   . Colon cancer Neg Hx   . Rectal cancer Neg Hx   . Stomach cancer Neg Hx   . Esophageal cancer Neg Hx   . Pancreatic cancer Neg Hx     BP (!) 206/80 (BP Location: Right Arm, Patient Position: Sitting, Cuff Size: Normal)   Pulse (!) 50   Ht 5\' 10"  (1.778 m)   Wt 161 lb 9.6 oz (73.3 kg)   SpO2  98%   BMI 23.19 kg/m   Review of Systems     Objective:   Physical Exam VITAL SIGNS:  See vs page GENERAL: no distress Pulses: dorsalis pedis absent bilat (poss due to edema).     MSK: no deformity of the feet CV: 3+ bilat leg edema Skin:  no ulcer on the feet.  normal color and temp on the feet.   Neuro: sensation is intact to touch on the feet.   Ext: there is bilateral onychomycosis of the toenails  A1c=6.6%     Assessment & Plan:  DM, due to panc insuff: overcontrolled.   Patient Instructions  Your blood pressure is high today.  Please see your primary care provider soon, to have it rechecked. check your blood sugar 4 times a day.  vary the time of day when you check, between before the 3 meals, and at bedtime.  also check if you have symptoms of your blood sugar being too high or too low.  please keep a record of the readings and bring it to your next appointment here (or you can bring the meter itself).  You can write it on any piece of paper.  please call us sooner if your blood sugar goes below 70, or if you have a lot of readings over 200.   Please reduce the humalog to 4 units with breakfast, 5 units with lunch, and 10 units with supper.   Please continue the same Basaglar.   Please come back for a follow-up appointment in 4 months.

## 2021-04-17 NOTE — Patient Instructions (Addendum)
Your blood pressure is high today.  Please see your primary care provider soon, to have it rechecked. check your blood sugar 4 times a day.  vary the time of day when you check, between before the 3 meals, and at bedtime.  also check if you have symptoms of your blood sugar being too high or too low.  please keep a record of the readings and bring it to your next appointment here (or you can bring the meter itself).  You can write it on any piece of paper.  please call us sooner if your blood sugar goes below 70, or if you have a lot of readings over 200.   Please reduce the humalog to 4 units with breakfast, 5 units with lunch, and 10 units with supper.   Please continue the same Basaglar.   Please come back for a follow-up appointment in 4 months.

## 2021-04-20 ENCOUNTER — Other Ambulatory Visit: Payer: Self-pay | Admitting: Family Medicine

## 2021-04-20 ENCOUNTER — Other Ambulatory Visit: Payer: Self-pay

## 2021-04-20 ENCOUNTER — Encounter: Payer: Self-pay | Admitting: Family Medicine

## 2021-04-20 ENCOUNTER — Ambulatory Visit (INDEPENDENT_AMBULATORY_CARE_PROVIDER_SITE_OTHER): Payer: Medicare Other | Admitting: Family Medicine

## 2021-04-20 VITALS — BP 184/80 | HR 57 | Temp 97.9°F | Ht 68.0 in | Wt 158.1 lb

## 2021-04-20 DIAGNOSIS — I10 Essential (primary) hypertension: Secondary | ICD-10-CM

## 2021-04-20 LAB — BASIC METABOLIC PANEL
BUN: 21 mg/dL (ref 6–23)
CO2: 23 mEq/L (ref 19–32)
Calcium: 8.8 mg/dL (ref 8.4–10.5)
Chloride: 109 mEq/L (ref 96–112)
Creatinine, Ser: 1.93 mg/dL — ABNORMAL HIGH (ref 0.40–1.50)
GFR: 31.35 mL/min — ABNORMAL LOW (ref >60.00)
Glucose, Bld: 193 mg/dL — ABNORMAL HIGH (ref 70–99)
Potassium: 5.9 mEq/L — ABNORMAL HIGH (ref 3.5–5.1)
Sodium: 137 mEq/L (ref 135–145)

## 2021-04-20 MED ORDER — RAMIPRIL 5 MG PO CAPS: 5.0000 mg | ORAL_CAPSULE | Freq: Every day | ORAL | 3 refills | Status: DC

## 2021-04-20 MED ORDER — METOPROLOL SUCCINATE ER 50 MG PO TB24: 50.0000 mg | ORAL_TABLET | Freq: Every day | ORAL | 3 refills | Status: DC

## 2021-04-20 MED ORDER — HYDROCHLOROTHIAZIDE 25 MG PO TABS: 25.0000 mg | ORAL_TABLET | Freq: Every day | ORAL | 3 refills | Status: DC

## 2021-04-20 MED ORDER — OMEPRAZOLE 40 MG PO CPDR: 40.0000 mg | DELAYED_RELEASE_CAPSULE | Freq: Every day | ORAL | 2 refills | Status: DC

## 2021-04-20 NOTE — Progress Notes (Addendum)
Chief Complaint  Patient presents with  . Hypertension    Subjective Ethan Mcneil is a 87 y.o. male who presents for hypertension follow up. He does not monitor home blood pressures. It has been high w home health and at endo office.  He is compliant with medications- Toprol XL 12.5 mg/d, Altace 2.5 mg/d. Marland Kitchen Patient has these side effects of medication: none He is adhering to a healthy diet overall. Current exercise: active in yard No Cp or SOB.    Past Medical History:  Diagnosis Date  . Anemia   . Arthritis   . CAD (coronary artery disease)    CABG 1997; grafts patent @ cath [facility] 11/09  . DM2 (diabetes mellitus, type 2) ([place])   . Gout    after tick bite  . History of kidney stones   . HLD (hyperlipidemia)   . HTN (hypertension)    essential nos  . Hypothyroid   . Pancreatic lesion   . Postsurgical aortocoronary bypass status   . PVD (peripheral vascular disease) ([place])   . S/P herniorrhaphy   . Tick fever 2012   [facility] , [place]  . Unspecified hyperplasia of prostate without urinary obstruction and other lower urinary tract symptoms (LUTS)   . Urolithiasis 2015   hx of  . Wears dentures   . Wears glasses     Exam BP (!) 184/80 (BP Location: Right Arm, Patient Position: Sitting, Cuff Size: Normal)   Pulse (!) 57   Temp 97.9 F (36.6 C) (Oral)   Ht 5\' 8"  (1.727 m)   Wt 158 lb 2 oz (71.7 kg)   SpO2 93%   BMI 24.04 kg/m  General:  well developed, well nourished, in no apparent distress Heart: RRR, no bruits, no LE edema Lungs: clear to auscultation, no accessory muscle use Psych: well oriented with normal range of affect and appropriate judgment/insight  Primary hypertension - Plan: metoprolol succinate (TOPROL-XL) 50 MG 24 hr tablet, ramipril (ALTACE) 5 MG capsule, Basic metabolic panel  Chronic, uncontrolled. Increase dosage of Toprol XL to 50 mg/d and Altace 5 mg/d. Ck labs. F/u in 5 d.  Counseled on diet and exercise. The patient voiced  understanding and agreement to the plan.  Addendum: I received lab results of K of 5.9. He had not changed med regimen yet. I was able to reach pt on evening of 5/6. Will stop ACEi for now, start HCTZ and recheck labs Mon. Given baseline, I have to believe hemolysis is playing a role. He does have DM, will discuss going back on ACEi when K more stable. Cont Toprol XL at 5 mg/d. Pt voiced understanding and agreement to plan.   [place], DO 04/20/21  10:40 AM

## 2021-04-20 NOTE — Patient Instructions (Addendum)
Keep the diet clean and stay active.  Check your blood pressures 2-3 times per week, alternating the time of day you check it. If it is high, considering waiting 1-2 minutes and rechecking. If it gets higher, your anxiety is likely creeping up and we should avoid rechecking.   Give [place] 2-3 business days to get the results of your labs back.    Take 2 tabs of your Toprol (50 mg daily total) and Altace (5 mg daily total) until you run out. A new dosage of each has been sent in.   Let us know if you need anything.

## 2021-04-23 ENCOUNTER — Other Ambulatory Visit: Payer: Self-pay

## 2021-04-23 ENCOUNTER — Other Ambulatory Visit (INDEPENDENT_AMBULATORY_CARE_PROVIDER_SITE_OTHER): Payer: Medicare Other

## 2021-04-23 ENCOUNTER — Other Ambulatory Visit: Payer: Self-pay | Admitting: Family Medicine

## 2021-04-23 DIAGNOSIS — E875 Hyperkalemia: Secondary | ICD-10-CM

## 2021-04-23 NOTE — Addendum Note (Signed)
Addended by: Sharon Seller B on: 04/23/2021 08:33 AM   Modules accepted: Orders

## 2021-04-24 LAB — BASIC METABOLIC PANEL
BUN: 21 mg/dL (ref 6–23)
CO2: 25 mEq/L (ref 19–32)
Calcium: 8.7 mg/dL (ref 8.4–10.5)
Chloride: 105 mEq/L (ref 96–112)
Creatinine, Ser: 1.91 mg/dL — ABNORMAL HIGH (ref 0.40–1.50)
GFR: 31.74 mL/min — ABNORMAL LOW (ref >60.00)
Glucose, Bld: 141 mg/dL — ABNORMAL HIGH (ref 70–99)
Potassium: 5.1 mEq/L (ref 3.5–5.1)
Sodium: 135 mEq/L (ref 135–145)

## 2021-04-25 ENCOUNTER — Other Ambulatory Visit: Payer: Self-pay

## 2021-04-25 ENCOUNTER — Telehealth (INDEPENDENT_AMBULATORY_CARE_PROVIDER_SITE_OTHER): Payer: Medicare Other | Admitting: Family Medicine

## 2021-04-25 ENCOUNTER — Encounter: Payer: Self-pay | Admitting: Family Medicine

## 2021-04-25 DIAGNOSIS — I10 Essential (primary) hypertension: Secondary | ICD-10-CM | POA: Diagnosis not present

## 2021-04-25 MED ORDER — AMLODIPINE BESYLATE 5 MG PO TABS
5.0000 mg | ORAL_TABLET | Freq: Every day | ORAL | 3 refills | Status: DC
Start: 2021-04-25 — End: 2021-05-11

## 2021-04-25 NOTE — Progress Notes (Addendum)
Chief Complaint  Patient presents with  . Follow-up    Subjective Ethan Mcneil is a 87 y.o. male who presents for hypertension follow up. Due to COVID-19 pandemic, we are interacting via telephone. I verified patient's ID using 2 identifiers. Patient agreed to proceed with visit via this method. Patient is at home, I am at home. Patient and I are present for visit.   He does monitor home blood pressures. Blood pressures ranging from 180-200's/90's on average. He is compliant with medications- HCTZ 25 mg/d, Toprol XL 50 mg/d. Patient has these side effects of medication: none He is adhering to a healthy diet overall. Current exercise: active outside No CP or SOB.    Past Medical History:  Diagnosis Date  . Anemia   . Arthritis   . CAD (coronary artery disease)    CABG 1997; grafts patent @ cath [facility] 11/09  . DM2 (diabetes mellitus, type 2) ([place])   . Gout    after tick bite  . History of kidney stones   . HLD (hyperlipidemia)   . HTN (hypertension)    essential nos  . Hypothyroid   . Pancreatic lesion   . Postsurgical aortocoronary bypass status   . PVD (peripheral vascular disease) ([place])   . S/P herniorrhaphy   . Tick fever 2012   [facility] , [place]  . Unspecified hyperplasia of prostate without urinary obstruction and other lower urinary tract symptoms (LUTS)   . Urolithiasis 2015   hx of  . Wears dentures   . Wears glasses     Exam No conversational dyspnea Age appropriate judgment and insight Nml affect and mood  Hypertension, unspecified type - Plan: amLODipine (NORVASC) 5 MG tablet  Counseled on diet and exercise. Add Norvasc 5 mg/d. Cont TOprol XL 50 mg/d and HCTZ 25 mg/d. K has come down. Will hold off on adding back ACEi for now until we can reck BMP. Low risk for OSA. Discussed checking his BP at home. Seems his technique is sound. Rec'd he reck 1-2 min after initial high reading.  F/u in 1 week. Total time: 13 min The patient voiced  understanding and agreement to the plan.  [place], DO 04/25/21  7:48 AM

## 2021-05-04 ENCOUNTER — Ambulatory Visit: Payer: Medicare Other | Admitting: Internal Medicine

## 2021-05-11 ENCOUNTER — Ambulatory Visit (INDEPENDENT_AMBULATORY_CARE_PROVIDER_SITE_OTHER): Payer: Medicare Other | Admitting: Internal Medicine

## 2021-05-11 ENCOUNTER — Other Ambulatory Visit: Payer: Self-pay

## 2021-05-11 VITALS — BP 195/65 | HR 41 | Temp 97.0°F | Ht 68.0 in | Wt 162.2 lb

## 2021-05-11 DIAGNOSIS — I10 Essential (primary) hypertension: Secondary | ICD-10-CM | POA: Diagnosis not present

## 2021-05-11 DIAGNOSIS — F439 Reaction to severe stress, unspecified: Secondary | ICD-10-CM | POA: Diagnosis not present

## 2021-05-11 DIAGNOSIS — E039 Hypothyroidism, unspecified: Secondary | ICD-10-CM

## 2021-05-11 LAB — CBC WITH DIFFERENTIAL/PLATELET
Basophils Absolute: 0.3 10*3/uL — ABNORMAL HIGH (ref 0.0–0.1)
Basophils Relative: 3.8 % — ABNORMAL HIGH (ref 0.0–3.0)
Eosinophils Absolute: 1.3 10*3/uL — ABNORMAL HIGH (ref 0.0–0.7)
Eosinophils Relative: 17.5 % — ABNORMAL HIGH (ref 0.0–5.0)
HCT: 30.7 % — ABNORMAL LOW (ref 39.0–52.0)
Hemoglobin: 9.9 g/dL — ABNORMAL LOW (ref 13.0–17.0)
Lymphocytes Relative: 24.5 % (ref 12.0–46.0)
Lymphs Abs: 1.8 10*3/uL (ref 0.7–4.0)
MCHC: 32.1 g/dL (ref 30.0–36.0)
MCV: 94.4 fl (ref 78.0–100.0)
Monocytes Absolute: 0.8 10*3/uL (ref 0.1–1.0)
Monocytes Relative: 11.3 % (ref 3.0–12.0)
Neutro Abs: 3.2 10*3/uL (ref 1.4–7.7)
Neutrophils Relative %: 42.9 % — ABNORMAL LOW (ref 43.0–77.0)
Platelets: 151 10*3/uL (ref 150.0–400.0)
RBC: 3.26 Mil/uL — ABNORMAL LOW (ref 4.22–5.81)
RDW: 14.2 % (ref 11.5–15.5)
WBC: 7.5 10*3/uL (ref 4.0–10.5)

## 2021-05-11 LAB — BASIC METABOLIC PANEL
BUN: 24 mg/dL — ABNORMAL HIGH (ref 6–23)
CO2: 23 mEq/L (ref 19–32)
Calcium: 8.7 mg/dL (ref 8.4–10.5)
Chloride: 109 mEq/L (ref 96–112)
Creatinine, Ser: 2.53 mg/dL — ABNORMAL HIGH (ref 0.40–1.50)
GFR: 22.65 mL/min — ABNORMAL LOW (ref >60.00)
Glucose, Bld: 89 mg/dL (ref 70–99)
Potassium: 5.7 mEq/L — ABNORMAL HIGH (ref 3.5–5.1)
Sodium: 137 mEq/L (ref 135–145)

## 2021-05-11 LAB — TSH: TSH: 3.2 u[IU]/mL (ref 0.35–4.50)

## 2021-05-11 MED ORDER — HYDRALAZINE HCL 10 MG PO TABS: 10.0000 mg | ORAL_TABLET | Freq: Three times a day (TID) | ORAL | 0 refills | Status: DC

## 2021-05-11 NOTE — Progress Notes (Signed)
Subjective:    Patient ID: Ethan Mcneil, male    DOB: Sep 21, 1934, 87 y.o.   MRN: [id number]  DOS:  05/11/2021 Type of visit - description: Follow-up  Last seen by me 01/04/2021  The patient noted his BP to be elevated for the last 2 months.  Saw Dr. Nani Ravens 04/20/2021, at that time blood pressure was high, potassium was 5.9. He rec to stop ACE inhibitor's, start HCTZ.  Was seen virtually again 04/25/2021, at home blood pressures were in the 180, 200 range. Amlodipine 5 mg was added. Here for follow-up  Review of Systems He denies changes in diet, not taking NSAIDs, no new medications.  Not taking decongestant.  When asked about the stress, he admits to be very stressed, his wife has stage IV cancer and is currently admitted, he has been the caregiver.  Denies chest pain, difficulty breathing, lower extremity edema or palpitations.  When his blood pressure is really high, he has mild dizziness but no headaches.   Past Medical History:  Diagnosis Date  . Anemia   . Arthritis   . CAD (coronary artery disease)    CABG 1997; grafts patent @ cath [facility] 11/09  . DM2 (diabetes mellitus, type 2) ([place])   . Gout    after tick bite  . History of kidney stones   . HLD (hyperlipidemia)   . HTN (hypertension)    essential nos  . Hypothyroid   . Pancreatic lesion   . Postsurgical aortocoronary bypass status   . PVD (peripheral vascular disease) ([place])   . S/P herniorrhaphy   . Tick fever 2012   [facility] , [place]  . Unspecified hyperplasia of prostate without urinary obstruction and other lower urinary tract symptoms (LUTS)   . Urolithiasis 2015   hx of  . Wears dentures   . Wears glasses     Past Surgical History:  Procedure Laterality Date  . BIOPSY  08/07/2020   Procedure: BIOPSY;  Surgeon: Rush Landmark Telford Nab., MD;  Location: [place];  Service: Gastroenterology;;  . CORONARY ARTERY BYPASS GRAFT     x7 vessels 1997; cath 2002 and 2009; grafts patent    . CYSTOSCOPY WITH RETROGRADE PYELOGRAM, URETEROSCOPY AND STENT PLACEMENT Right 02/21/2014   Procedure: CYSTOSCOPY WITH RIGHT  RETROGRADE PYELOGRAM,RIGHT  URETEROSCOPY WITH STONE BASKETING EXTRACTION;  Surgeon: Irine Seal, MD;  [facility];  Service: Urology;  Laterality: Right;  . ESOPHAGOGASTRODUODENOSCOPY (EGD) WITH PROPOFOL N/A 08/07/2020   Procedure: ESOPHAGOGASTRODUODENOSCOPY (EGD) WITH PROPOFOL;  Surgeon: Rush Landmark Telford Nab., MD;  Location: [place];  Service: Gastroenterology;  Laterality: N/A;  . EUS N/A 08/07/2020   Procedure: UPPER ENDOSCOPIC ULTRASOUND (EUS) LINEAR;  Surgeon: Irving Copas., MD;  Location: [place];  Service: Gastroenterology;  Laterality: N/A;  . FINE NEEDLE ASPIRATION N/A 08/07/2020   Procedure: FINE NEEDLE ASPIRATION (FNA) LINEAR;  Surgeon: Irving Copas., MD;  Location: [place];  Service: Gastroenterology;  Laterality: N/A;  . HERNIA REPAIR    . INGUINAL HERNIA REPAIR    . MULTIPLE TOOTH EXTRACTIONS    . SHOULDER SURGERY    . SPLENECTOMY, TOTAL N/A 10/26/2020   Procedure: SPLENECTOMY;  Surgeon: Stark Klein, MD;  Location: [place];  Service: General;  Laterality: N/A;    Allergies as of 05/11/2021      Reactions   Ciprofloxacin Itching   Codeine Nausea And Vomiting      Medication List       Accurate as of May 11, 2021 10:24 AM.  If you have any questions, ask your nurse or doctor.        acetaminophen 500 MG tablet Commonly known as: TYLENOL Take 500 mg by mouth every 6 (six) hours as needed for moderate pain or headache.   amLODipine 5 MG tablet Commonly known as: NORVASC Take 1 tablet (5 mg total) by mouth daily.   aspirin EC 81 MG tablet Take 81 mg by mouth at bedtime.   B-12 1000 MCG Tabs Take 1,000 mcg by mouth daily.   B-D UF III MINI PEN NEEDLES 31G X 5 MM Misc Generic drug: Insulin Pen Needle 1 each by Other route 3 (three) times daily. E11.9   Basaglar KwikPen 100 UNIT/ML Inject 0.03 mLs (3  Units total) into the skin at bedtime.   FreeStyle Libre 2 Sensor Misc 1 Device by Does not apply route every 14 (fourteen) days.   FREESTYLE LITE test strip Generic drug: glucose blood CHECK BLOOD SUGAR NO MORE THAN TWICE DAILY.   hydrochlorothiazide 25 MG tablet Commonly known as: HYDRODIURIL Take 1 tablet (25 mg total) by mouth daily.   insulin lispro 100 UNIT/ML KwikPen Commonly known as: HumaLOG KwikPen 4 units with breakfast, 5 units with lunch, and 10 units with supper   levothyroxine 112 MCG tablet Commonly known as: SYNTHROID Take 1 tablet (112 mcg total) by mouth daily before lunch.   metoprolol succinate 50 MG 24 hr tablet Commonly known as: TOPROL-XL Take 1 tablet (50 mg total) by mouth daily. Take with or immediately following a meal.   omeprazole 40 MG capsule Commonly known as: PRILOSEC Take 1 capsule (40 mg total) by mouth daily.   simvastatin 20 MG tablet Commonly known as: ZOCOR Take 1 tablet (20 mg total) by mouth at bedtime.   Vitamin D3 50 MCG (2000 UT) capsule Take 2,000 Units by mouth daily.   Zenpep 25000-79000 units Cpep Generic drug: Pancrelipase (Lip-Prot-Amyl) Take 2 capsules by mouth with every meal and snacks          Objective:   Physical Exam BP (!) 195/65 (BP Location: Right Arm, Patient Position: Sitting, Cuff Size: Large)   Pulse (!) 41   Temp (!) 97 F (36.1 C) (Temporal)   Ht 5\' 8"  (1.727 m)   Wt 162 lb 3.2 oz (73.6 kg)   SpO2 100%   BMI 24.66 kg/m  General:   Well developed, NAD, BMI noted.  HEENT:  Normocephalic . Face symmetric, atraumatic Lungs:  CTA B Normal respiratory effort, no intercostal retractions, no accessory muscle use. Heart: Bradycardic.  Abdomen:  Not distended, soft, non-tender. No rebound or rigidity.  No bruit  Skin: Not pale. Not jaundice Lower extremities: no pretibial edema bilaterally  Neurologic:  alert & oriented X3.  Speech normal, gait appropriate for age and unassisted Psych--   Cognition and judgment appear intact.  Cooperative with normal attention span and concentration.  Behavior appropriate. No anxious or depressed appearing.     Assessment     Assessment DM -- w/ CAD, no neuropathy, intolerant to metformin , sees ENDO HTN Hyperlipidemia Hypothyroidism DJD BPH, LUTS,h/o urolithiasis -- sees urology CV: --CAD, CABG 1997, cardiac cath 2009 --Peripheral vascular disease (no ABIs in the chart) --L leg edema, chronic, (-) DVT per [place] 2015 Gout GI: Pancreatic insufficiency dx 02-2017 Anemia: Mild, normal iron 2015 B12 deficiency, mild 2015  Wt loss:  2012 187 lb, 2014 173 lb , 2015 167 01-2017: EGD normal, BX chronic active gastritis. Colonoscopy normal. Saw GI 02-2017: likely d/t Pancreatic  insufficiency and poorly controlled diabetes. 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  Surgeries 10-2020  PLAN: HTN: Previously well controlled. BP increased for 2 months, no obvious etiology except that he is very stressed regards his wife's health.  No abdominal bruits on exam. When he was seen few weeks ago, potassium was elevated, Altace discontinued, rx  HCTZ. He continue with metoprolol, he is a slightly bradycardic today. Amlodipine was added 04/25/2021.  He has chronic lower extremity edema which for now is at baseline. Patient needs better BP control, many options available. I am electing to add hydralazine 10 mg 3 times daily, call with BP readings in 5 days, see AVS. Check a BMP, CBC.  Consider renal artery ultrasound` Addendum: Potassium 5.7, creatinine up to 2.5.  Hemoglobin 9.9, slightly lower than baseline. Plan: Stop HCTZ (should help creatinine and hopefully potassium). Increase amlodipine 5 mg to 2 tablets daily Proceed with hydralazine. BMP if 5 days Monitor BPs All of the above carefully discussed with the  patient.  He verbalized understanding and took notes. Hypothyroidism: Check TSH Stress: Related to his wife's health denies the need for medication RTC 3 weeks.   Time spent 40 minutes, reviewing labs, arranging for a new plan, communicating with patient.  This visit occurred during the SARS-CoV-2 public health emergency.  Safety protocols were in place, including screening questions prior to the visit, additional usage of staff PPE, and extensive cleaning of exam room while observing appropriate contact time as indicated for disinfecting solutions.

## 2021-05-11 NOTE — Patient Instructions (Addendum)
Continue with the medications as you are doing today  Will add hydralazine 10 mg: 1 tablet 3 times daily.  Check the  blood pressure twice daily. BP GOAL is between 110/65 and  135/85. Call with your blood pressure readings in 5 to 6 days  If you have severe headache, dizziness, chest pain, difficulty breathing: Go to the ER   GO TO THE LAB : Get the blood work     [place], [place] back for a checkup in 3 weeks

## 2021-05-12 NOTE — Assessment & Plan Note (Signed)
HTN: Previously well controlled. BP increased for 2 months, no obvious etiology except that he is very stressed regards his wife's health.  No abdominal bruits on exam. When he was seen few weeks ago, potassium was elevated, Altace discontinued, rx  HCTZ. He continue with metoprolol, he is a slightly bradycardic today. Amlodipine was added 04/25/2021.  He has chronic lower extremity edema which for now is at baseline. Patient needs better BP control, many options available. I am electing to add hydralazine 10 mg 3 times daily, call with BP readings in 5 days, see AVS. Check a BMP, CBC.  Consider renal artery ultrasound` Addendum: Potassium 5.7, creatinine up to 2.5.  Hemoglobin 9.9, slightly lower than baseline. Plan: Stop HCTZ (should help creatinine and hopefully potassium). Increase amlodipine 5 mg to 2 tablets daily Proceed with hydralazine. BMP if 5 days Monitor BPs All of the above carefully discussed with the patient.  He verbalized understanding and took notes. Hypothyroidism: Check TSH Stress: Related to his wife's health denies the need for medication RTC 3 weeks.

## 2021-05-14 ENCOUNTER — Other Ambulatory Visit: Payer: Self-pay | Admitting: Internal Medicine

## 2021-05-16 ENCOUNTER — Telehealth: Payer: Self-pay

## 2021-05-16 ENCOUNTER — Telehealth: Payer: Self-pay | Admitting: *Deleted

## 2021-05-16 ENCOUNTER — Other Ambulatory Visit: Payer: Self-pay

## 2021-05-16 ENCOUNTER — Other Ambulatory Visit (INDEPENDENT_AMBULATORY_CARE_PROVIDER_SITE_OTHER): Payer: Medicare Other

## 2021-05-16 ENCOUNTER — Ambulatory Visit (INDEPENDENT_AMBULATORY_CARE_PROVIDER_SITE_OTHER): Payer: Medicare Other

## 2021-05-16 VITALS — BP 172/68 | HR 48

## 2021-05-16 DIAGNOSIS — R7989 Other specified abnormal findings of blood chemistry: Secondary | ICD-10-CM

## 2021-05-16 DIAGNOSIS — I10 Essential (primary) hypertension: Secondary | ICD-10-CM

## 2021-05-16 DIAGNOSIS — E875 Hyperkalemia: Secondary | ICD-10-CM

## 2021-05-16 LAB — BASIC METABOLIC PANEL
BUN: 27 mg/dL — ABNORMAL HIGH (ref 6–23)
CO2: 21 mEq/L (ref 19–32)
Calcium: 8.6 mg/dL (ref 8.4–10.5)
Chloride: 110 mEq/L (ref 96–112)
Creatinine, Ser: 2.28 mg/dL — ABNORMAL HIGH (ref 0.40–1.50)
GFR: 25.66 mL/min — ABNORMAL LOW (ref >60.00)
Glucose, Bld: 175 mg/dL — ABNORMAL HIGH (ref 70–99)
Potassium: 6.4 mEq/L (ref 3.5–5.1)
Sodium: 135 mEq/L (ref 135–145)

## 2021-05-16 MED ORDER — HYDRALAZINE HCL 10 MG PO TABS: 20.0000 mg | ORAL_TABLET | Freq: Three times a day (TID) | ORAL | 0 refills | Status: DC

## 2021-05-16 MED ORDER — LOKELMA 10 G PO PACK: PACK | ORAL | 0 refills | Status: DC

## 2021-05-16 NOTE — Telephone Encounter (Signed)
PA initiated via Covermymeds; KEY; [id number]. PA approved. Effective 04/16/2021 to 05/16/2022.

## 2021-05-16 NOTE — Telephone Encounter (Signed)
Urgent nephrology referral placed. Hydralazine 10mg  2 tabs tid sent to [facility].    LMOM for Pt asking for call back.

## 2021-05-16 NOTE — Telephone Encounter (Signed)
See last visit. Patient was seen by my nurse today, BP was still elevated at 172/68 consistent with his readings at home. Current medications are: --Amlodipine 10 mg daily. --Hydralazine 10 mg 3 times a day. BMP daily show: Ongoing hypokalemia and persistently elevated creatinine despite  stopping HCTZ. Plan: Increase hydralazine 10 mg to: 2 tablets 3 times daily Continue amlodipine Monitor BPs Lokelma: 10 g every 8 hours x 3, then 10 g daily. Refer to nephrology to be seen ASAP, DX hyperkalemia, increased creatinine. Office visit here 1 week.

## 2021-05-16 NOTE — Progress Notes (Addendum)
Pt here for Blood pressure check per Dr. Larose Kells  Pt currently takes:  Metoprolol Succinate 50 MG daily   Pt reports compliance with medication but hasn't taking medication this morning.  BP today @ =172/68 HR = 48  Pt advised per Dr. Larose Kells that blood pressures are improving and would like pt to get labs done today to determine appropriate changes to medications.  Pt reported blood pressures at home:  Sunday- 178/78 [id number] Tuesday- 168/74 Wednesday at home---168/74

## 2021-05-16 NOTE — Telephone Encounter (Signed)
CRITICAL VALUE STICKER  CRITICAL VALUE: Potassium 6.4  RECEIVER (on-site recipient of call): Ritisha Deitrick, [place] NOTIFIED: 05/16/21 @ 2pm  MESSENGER (representative from lab):  Saa   MD NOTIFIED: [place]: 2:01pm  RESPONSE:

## 2021-05-17 ENCOUNTER — Other Ambulatory Visit: Payer: Self-pay | Admitting: Internal Medicine

## 2021-05-17 ENCOUNTER — Telehealth: Payer: Self-pay

## 2021-05-17 NOTE — Telephone Encounter (Signed)
Spoke w/ Pt- informed that Milly Jakob is the medication that is very expensive- informed unfortunately there is not an alternative to this medication, recommended he go ahead and get it to start to bring down his potassium levels- informed that hopefully this will not be a long term medication. Pt verbalized understanding.

## 2021-05-17 NOTE — Telephone Encounter (Signed)
amLODipine (NORVASC) 5 MG tablet [[id number]]    Patient states that pharmacy said that insurance is not covering and wants to get sent something else in

## 2021-05-17 NOTE — Telephone Encounter (Signed)
LMOM asking for call back.  

## 2021-05-17 NOTE — Telephone Encounter (Signed)
Spoke w/ Pt- informed of results and recommendations. Pt verbalized understanding. 1 week f/u scheduled 05/25/21.

## 2021-05-23 ENCOUNTER — Other Ambulatory Visit: Payer: Self-pay | Admitting: Internal Medicine

## 2021-05-23 NOTE — Progress Notes (Signed)
Subjective:   Ethan Mcneil is a 87 y.o. male who presents for Medicare Annual/Subsequent preventive examination.  Review of Systems     Cardiac Risk Factors include: advanced age (>2men, >77 women);male gender;diabetes mellitus;dyslipidemia;hypertension     Objective:    Today's Vitals   05/24/21 0851  BP: (!) 168/62  Pulse: (!) 47  Resp: 16  Temp: (!) 97.3 F (36.3 C)  TempSrc: Temporal  SpO2: 97%  Weight: 171 lb 12.8 oz (77.9 kg)  Height: 5\' 8"  (1.727 m)    Body mass index is 26.12 kg/m.  Advanced Directives 05/24/2021 10/27/2020 10/23/2020 08/07/2020 07/23/2020 05/22/2020 05/20/2019  Does Patient Have a Medical Advance Directive? Yes Yes Yes Yes No Yes No  Type of Paramedic of [place];Living will Living will Living will [place];Living will - [place];Living will -  Does patient want to make changes to medical advance directive? - No - Patient declined No - Patient declined - - No - Patient declined No - Patient declined  Copy of [place] in Chart? Yes - validated most recent copy scanned in chart (See row information) - - No - copy requested - No - copy requested -  Would patient like information on creating a medical advance directive? - - - - No - Patient declined - -    Current Medications (verified) Outpatient Encounter Medications as of 05/24/2021  Medication Sig   acetaminophen (TYLENOL) 500 MG tablet Take 500 mg by mouth every 6 (six) hours as needed for moderate pain or headache.   amLODipine (NORVASC) 5 MG tablet Take 2 tablets (10 mg total) by mouth daily.   aspirin EC 81 MG tablet Take 81 mg by mouth at bedtime.    Cholecalciferol (VITAMIN D3) 50 MCG (2000 UT) capsule Take 2,000 Units by mouth daily.   Continuous Blood Gluc Sensor (FREESTYLE LIBRE 2 SENSOR) MISC 1 Device by Does not apply route every 14 (fourteen) days.   Cyanocobalamin (B-12) 1000 MCG TABS Take 1,000 mcg by  mouth daily.   glucose blood (FREESTYLE LITE) test strip CHECK BLOOD SUGAR NO MORE THAN TWICE DAILY.   hydrALAZINE (APRESOLINE) 10 MG tablet Take 2 tablets (20 mg total) by mouth 3 (three) times daily.   Insulin Glargine (BASAGLAR KWIKPEN) 100 UNIT/ML Inject 0.03 mLs (3 Units total) into the skin at bedtime.   insulin lispro (HUMALOG KWIKPEN) 100 UNIT/ML KwikPen 4 units with breakfast, 5 units with lunch, and 10 units with supper   Insulin Pen Needle (B-D UF III MINI PEN NEEDLES) 31G X 5 MM MISC 1 each by Other route 3 (three) times daily. E11.9   levothyroxine (SYNTHROID) 112 MCG tablet Take 1 tablet (112 mcg total) by mouth daily before lunch.   metoprolol succinate (TOPROL-XL) 50 MG 24 hr tablet Take 1 tablet (50 mg total) by mouth daily. Take with or immediately following a meal.   omeprazole (PRILOSEC) 40 MG capsule Take 1 capsule (40 mg total) by mouth daily.   Pancrelipase, Lip-Prot-Amyl, (ZENPEP) 25000-79000 units CPEP Take 2 capsules by mouth with every meal and snacks   simvastatin (ZOCOR) 20 MG tablet Take 1 tablet (20 mg total) by mouth at bedtime.   sodium zirconium cyclosilicate (LOKELMA) 10 g PACK packet 10 g every 8 hours for 3 times.  Then 10 g daily.   No facility-administered encounter medications on file as of 05/24/2021.    Allergies (verified) Ciprofloxacin and Codeine   History: Past Medical History:  Diagnosis Date   Anemia    Arthritis    CAD (coronary artery disease)    CABG 1997; grafts patent @ cath [facility] 11/09   DM2 (diabetes mellitus, type 2) ([place])    Gout    after tick bite   History of kidney stones    HLD (hyperlipidemia)    HTN (hypertension)    essential nos   Hypothyroid    Pancreatic lesion    Postsurgical aortocoronary bypass status    PVD (peripheral vascular disease) ([place])    S/P herniorrhaphy    Tick fever 2012   [facility] [place]   Unspecified hyperplasia of prostate without urinary obstruction and other lower urinary tract  symptoms (LUTS)    Urolithiasis 2015   hx of   Wears dentures    Wears glasses    Past Surgical History:  Procedure Laterality Date   BIOPSY  08/07/2020   Procedure: BIOPSY;  Surgeon: Irving Copas., MD;  Location: [facility];  Service: Gastroenterology;;   CORONARY ARTERY BYPASS GRAFT     x7 vessels 1997; cath 2002 and 2009; grafts patent    [place], URETEROSCOPY AND STENT PLACEMENT Right 02/21/2014   Procedure: CYSTOSCOPY WITH RIGHT  RETROGRADE PYELOGRAM,RIGHT  URETEROSCOPY WITH STONE BASKETING EXTRACTION;  Surgeon: Irine Seal, MD;  Location: [facility];  Service: Urology;  Laterality: Right;   ESOPHAGOGASTRODUODENOSCOPY (EGD) WITH PROPOFOL N/A 08/07/2020   Procedure: ESOPHAGOGASTRODUODENOSCOPY (EGD) WITH PROPOFOL;  Surgeon: Rush Landmark Telford Nab., MD;  Location: [place];  Service: Gastroenterology;  Laterality: N/A;   EUS N/A 08/07/2020   Procedure: UPPER ENDOSCOPIC ULTRASOUND (EUS) LINEAR;  Surgeon: Irving Copas., MD;  Location: [facility];  Service: Gastroenterology;  Laterality: N/A;   FINE NEEDLE ASPIRATION N/A 08/07/2020   Procedure: FINE NEEDLE ASPIRATION (FNA) LINEAR;  Surgeon: Irving Copas., MD;  Location: [facility] [place];  Service: Gastroenterology;  Laterality: N/A;   HERNIA REPAIR     INGUINAL HERNIA REPAIR     MULTIPLE TOOTH EXTRACTIONS     SHOULDER SURGERY     SPLENECTOMY, TOTAL N/A 10/26/2020   Procedure: SPLENECTOMY;  Surgeon: Stark Klein, MD;  Location: [place];  Service: General;  Laterality: N/A;   Family History  Problem Relation Age of Onset   Lung cancer Father    Coronary artery disease Mother        stent   Diabetes Brother    Prostate cancer Maternal Uncle        in his 28s   Stroke Son        GF   Throat cancer Son    Colon cancer Neg Hx    Rectal cancer Neg Hx    Stomach cancer Neg Hx    Esophageal cancer Neg Hx    Pancreatic cancer Neg Hx    Social History   Socioeconomic History    Marital status: Married    Spouse name: Electrical engineer   Number of children: 4   Years of education: Not on file   Highest education level: Not on file  Occupational History   Occupation: retired, works few hours   Tobacco Use   Smoking status: Never   Smokeless tobacco: Never  Vaping Use   Vaping Use: Never used  Substance and Sexual Activity   Alcohol use: No   Drug use: No   Sexual activity: Not Currently  Other Topics Concern   Not on file  Social History Narrative   Buyer, retail and former race Nutritional therapist  4 children (boys)   Designated party form signed appointing wife - [name]; ok to leave msg on home phone [phone]         Social Determinants of Health   Financial Resource Strain: Low Risk    Difficulty of Paying Living Expenses: Not hard at all  Food Insecurity: No Food Insecurity   Worried About Charity fundraiser in the Last Year: Never true   Arboriculturist in the Last Year: Never true  Transportation Needs: No Transportation Needs   Lack of Transportation (Medical): No   Lack of Transportation (Non-Medical): No  Physical Activity: Insufficiently Active   Days of Exercise per Week: 5 days   Minutes of Exercise per Session: 20 min  Stress: No Stress Concern Present   Feeling of Stress : Only a little  Social Connections: Moderately Integrated   Frequency of Communication with Friends and Family: More than three times a week   Frequency of Social Gatherings with Friends and Family: More than three times a week   Attends Religious Services: More than 4 times per year   Active Member of [employer] or Organizations: No   Attends Music therapist: Never   Marital Status: Married    Tobacco Counseling Counseling given: Not Answered   Clinical Intake:  Pre-visit preparation completed: Yes  Pain : No/denies pain     Nutritional Status: BMI 25 -29 Overweight Nutritional Risks: None Diabetes: Yes CBG done?: No Did pt. bring in CBG monitor  from home?: No  How often do you need to have someone help you when you read instructions, pamphlets, or other written materials from your doctor or pharmacy?: 1 - Never  Diabetes:  Is the patient diabetic?  Yes  If diabetic, was a CBG obtained today?  No  Did the patient bring in their glucometer from home?  No  How often do you monitor your CBG's? Dexcom.   Financial Strains and Diabetes Management:  Are you having any financial strains with the device, your supplies or your medication? No .  Does the patient want to be seen by Chronic Care Management for management of their diabetes?  No  Would the patient like to be referred to a Nutritionist or for Diabetic Management?  No   Diabetic Exams:  Diabetic Eye Exam: Completed 02/15/2021.  Diabetic Foot Exam: Completed 04/17/2021.   Interpreter Needed?: No  Information entered by :: Caroleen Hamman LPN   Activities of Daily Living In your present state of health, do you have any difficulty performing the following activities: 05/24/2021 10/29/2020  Hearing? Y Y  Comment hearing aids -  Vision? N N  Difficulty concentrating or making decisions? Y N  Comment occasionally forgets names -  Walking or climbing stairs? N N  Dressing or bathing? N Y  Doing errands, shopping? N N  Preparing Food and eating ? N -  Using the Toilet? N -  In the past six months, have you accidently leaked urine? N -  Do you have problems with loss of bowel control? N -  Managing your Medications? N -  Managing your Finances? N -  Housekeeping or managing your Housekeeping? N -  Some recent data might be hidden    Patient Care Team: Colon Branch, MD as PCP - General (Internal Medicine) Festus Aloe, MD as Consulting Physician (Urology) Josue Hector, MD as Consulting Physician (Cardiology) Irene Shipper, MD as Consulting Physician (Gastroenterology) Renato Shin, MD as Consulting Physician (  Endocrinology) Phylliss Blakes, OD as Consulting  Physician (Optometry)  Indicate any recent Medical Services you may have received from other than [facility] providers in the past year (date may be approximate).     Assessment:   This is a routine wellness examination for [place].  Hearing/Vision screen Hearing Screening - Comments:: Has hearing aids but does not always wear them Vision Screening - Comments:: Last eye exam-2022-Dr. Haggard Wears reading glasses  Dietary issues and exercise activities discussed: Current Exercise Habits: Home exercise routine, Type of exercise: walking, Time (Minutes): 20, Frequency (Times/Week): 5, Weekly Exercise (Minutes/Week): 100, Intensity: Mild   Goals Addressed             This Visit's Progress    Maintain current health and good blood sugar levels   On track    Maintain physical strength   On track      Depression Screen PHQ 2/9 Scores 05/24/2021 01/04/2021 05/22/2020 05/20/2019 05/18/2018 05/15/2017 01/06/2017  PHQ - 2 Score 0 0 0 0 0 0 0    Fall Risk Fall Risk  05/24/2021 05/11/2021 01/04/2021 05/22/2020 05/20/2019  Falls in the past year? 0 0 0 0 0  Number falls in past yr: 0 0 0 0 -  Injury with Fall? 0 0 0 0 -  Follow up Falls prevention discussed - - Education provided;Falls prevention discussed -    FALL RISK PREVENTION PERTAINING TO THE HOME:  Any stairs in or around the home? Yes  If so, are there any without handrails? No  Home free of loose throw rugs in walkways, pet beds, electrical cords, etc? Yes  Adequate lighting in your home to reduce risk of falls? Yes   ASSISTIVE DEVICES UTILIZED TO PREVENT FALLS:  Life alert? No  Use of a cane, walker or w/c? No  Grab bars in the bathroom? No  Shower chair or bench in shower? Yes  Elevated toilet seat or a handicapped toilet? No   TIMED UP AND GO:  Was the test performed? Yes .  Length of time to ambulate 10 feet: 9 sec.   Gait steady and fast without use of assistive device  Cognitive Function:Normal cognitive status assessed by  direct observation by this Nurse Health Advisor. No abnormalities found.   MMSE - Mini Mental State Exam 05/18/2018 05/15/2017  Orientation to time 5 5  Orientation to Place 5 5  Registration 3 3  Attention/ Calculation 5 4  Recall 3 2  Language- name 2 objects 2 2  Language- repeat 1 0  Language- follow 3 step command 3 3  Language- read & follow direction 1 1  Write a sentence 1 1  Copy design 1 1  Total score 30 27     6CIT Screen 05/24/2021 05/22/2020  What Year? 0 points 0 points  What month? 0 points 0 points  What time? 0 points 0 points  Count back from 20 0 points 2 points  Months in reverse 0 points 0 points  Repeat phrase 2 points 0 points  Total Score 2 2    Immunizations Immunization History  Administered Date(s) Administered   Fluad Quad(high Dose 65+) 09/17/2019, 10/30/2020   Influenza Split 10/29/2011, 11/04/2012   Influenza Whole 11/03/2007, 10/09/2009   Influenza, High Dose Seasonal PF 11/23/2015, 09/04/2016, 09/16/2017, 11/23/2018   Influenza,inj,Quad PF,6+ Mos 10/10/2014   Moderna Sars-Covid-2 Vaccination 01/13/2020, 02/10/2020, 12/01/2020   Pneumococcal Conjugate-13 11/23/2015   Pneumococcal Polysaccharide-23 05/10/2010   Tdap 03/07/2014   Zoster, Live 06/07/2014  TDAP status: Up to date  Flu Vaccine status: Up to date  Pneumococcal vaccine status: Up to date  Covid-19 vaccine status: Completed vaccines  Qualifies for Shingles Vaccine? Yes   Zostavax completed Yes   Shingrix Completed?: No.    Education has been provided regarding the importance of this vaccine. Patient has been advised to call insurance company to determine out of pocket expense if they have not yet received this vaccine. Advised may also receive vaccine at local pharmacy or Health Dept. Verbalized acceptance and understanding.  Screening Tests Health Maintenance  Topic Date Due   Pneumococcal Vaccine 6-36 Years old (1 - PCV) Never done   Zoster Vaccines- Shingrix (1 of 2)  Never done   COVID-19 Vaccine (4 - Booster for Moderna series) 03/01/2021   INFLUENZA VACCINE  07/16/2021   HEMOGLOBIN A1C  10/18/2021   OPHTHALMOLOGY EXAM  02/15/2022   FOOT EXAM  04/17/2022   TETANUS/TDAP  03/07/2024   PNA vac Low Risk Adult  Completed   HPV VACCINES  Aged Out    Health Maintenance  Health Maintenance Due  Topic Date Due   Pneumococcal Vaccine 23-64 Years old (1 - PCV) Never done   Zoster Vaccines- Shingrix (1 of 2) Never done   COVID-19 Vaccine (4 - Booster for Moderna series) 03/01/2021    Colorectal cancer screening: No longer required.   Lung Cancer Screening: (Low Dose CT Chest recommended if Age 66-80 years, 30 pack-year currently smoking OR have quit w/in 15years.) does not qualify.    Additional Screening:  Hepatitis C Screening: does not qualify  Vision Screening: Recommended annual ophthalmology exams for early detection of glaucoma and other disorders of the eye. Is the patient up to date with their annual eye exam?  Yes  Who is the provider or what is the name of the office in which the patient attends annual eye exams? Dr. Darcel Smalling   Dental Screening: Recommended annual dental exams for proper oral hygiene  Community Resource Referral / Chronic Care Management: CRR required this visit?  No   CCM required this visit?  No      Plan:     I have personally reviewed and noted the following in the patient's chart:   Medical and social history Use of alcohol, tobacco or illicit drugs  Current medications and supplements including opioid prescriptions. Patient is not currently taking opioid prescriptions. Functional ability and status Nutritional status Physical activity Advanced directives List of other physicians Hospitalizations, surgeries, and ER visits in previous 12 months Vitals Screenings to include cognitive, depression, and falls Referrals and appointments  In addition, I have reviewed and discussed with patient certain  preventive protocols, quality metrics, and best practice recommendations. A written personalized care plan for preventive services as well as general preventive health recommendations were provided to patient.      Marta Antu, LPN   05/17/5637  Nurse Health Advisor  Nurse Notes: Patient has an appt to see PCP tomorrow regarding his B/P.

## 2021-05-24 ENCOUNTER — Ambulatory Visit: Payer: Medicare Other | Admitting: *Deleted

## 2021-05-24 ENCOUNTER — Other Ambulatory Visit: Payer: Self-pay

## 2021-05-24 ENCOUNTER — Ambulatory Visit (INDEPENDENT_AMBULATORY_CARE_PROVIDER_SITE_OTHER): Payer: Medicare Other

## 2021-05-24 VITALS — BP 168/62 | HR 47 | Temp 97.3°F | Resp 16 | Ht 68.0 in | Wt 171.8 lb

## 2021-05-24 DIAGNOSIS — Z0000 Encounter for general adult medical examination without abnormal findings: Secondary | ICD-10-CM

## 2021-05-24 NOTE — Patient Instructions (Signed)
Ethan Mcneil , Thank you for taking time to come for your Medicare Wellness Visit. I appreciate your ongoing commitment to your health goals. Please review the following plan we discussed and let me know if I can assist you in the future.   Screening recommendations/referrals: Colonoscopy: No longer required Recommended yearly ophthalmology/optometry visit for glaucoma screening and checkup Recommended yearly dental visit for hygiene and checkup  Vaccinations: Influenza vaccine: Up to date Pneumococcal vaccine: Up to date Tdap vaccine: Up to date-Due 03/07/2024 Shingles vaccine: Discuss with pharmacy   Covid-19: Up to date  Advanced directives: Copy in chart  Conditions/risks identified: See problem list  Next appointment: Follow up in one year for your annual wellness visit. 05/28/2022 @ 9:00  Preventive Care 85 Years and Older, Male Preventive care refers to lifestyle choices and visits with your health care provider that can promote health and wellness. What does preventive care include? A yearly physical exam. This is also called an annual well check. Dental exams once or twice a year. Routine eye exams. Ask your health care provider how often you should have your eyes checked. Personal lifestyle choices, including: Daily care of your teeth and gums. Regular physical activity. Eating a healthy diet. Avoiding tobacco and drug use. Limiting alcohol use. Practicing safe sex. Taking low doses of aspirin every day. Taking vitamin and mineral supplements as recommended by your health care provider. What happens during an annual well check? The services and screenings done by your health care provider during your annual well check will depend on your age, overall health, lifestyle risk factors, and family history of disease. Counseling  Your health care provider may ask you questions about your: Alcohol use. Tobacco use. Drug use. Emotional well-being. Home and relationship  well-being. Sexual activity. Eating habits. History of falls. Memory and ability to understand (cognition). Work and work Statistician. Screening  You may have the following tests or measurements: Height, weight, and BMI. Blood pressure. Lipid and cholesterol levels. These may be checked every 5 years, or more frequently if you are over 85 years old. Skin check. Lung cancer screening. You may have this screening every year starting at age 85 if you have a 30-pack-year history of smoking and currently smoke or have quit within the past 15 years. Fecal occult blood test (FOBT) of the stool. You may have this test every year starting at age 85. Flexible sigmoidoscopy or colonoscopy. You may have a sigmoidoscopy every 5 years or a colonoscopy every 10 years starting at age 85. Prostate cancer screening. Recommendations will vary depending on your family history and other risks. Hepatitis C blood test. Hepatitis B blood test. Sexually transmitted disease (STD) testing. Diabetes screening. This is done by checking your blood sugar (glucose) after you have not eaten for a while (fasting). You may have this done every 1-3 years. Abdominal aortic aneurysm (AAA) screening. You may need this if you are a current or former smoker. Osteoporosis. You may be screened starting at age 85 if you are at high risk. Talk with your health care provider about your test results, treatment options, and if necessary, the need for more tests. Vaccines  Your health care provider may recommend certain vaccines, such as: Influenza vaccine. This is recommended every year. Tetanus, diphtheria, and acellular pertussis (Tdap, Td) vaccine. You may need a Td booster every 10 years. Zoster vaccine. You may need this after age 85. Pneumococcal 13-valent conjugate (PCV13) vaccine. One dose is recommended after age 85. Pneumococcal polysaccharide (PPSV23) vaccine.  One dose is recommended after age 85. Talk to your health care  provider about which screenings and vaccines you need and how often you need them. This information is not intended to replace advice given to you by your health care provider. Make sure you discuss any questions you have with your health care provider. Document Released: 12/29/2015 Document Revised: 08/21/2016 Document Reviewed: 10/03/2015 Elsevier Interactive Patient Education  2017 [place] Prevention in the Home Falls can cause injuries. They can happen to people of all ages. There are many things you can do to make your home safe and to help prevent falls. What can I do on the outside of my home? Regularly fix the edges of walkways and driveways and fix any cracks. Remove anything that might make you trip as you walk through a door, such as a raised step or threshold. Trim any bushes or trees on the path to your home. Use bright outdoor lighting. Clear any walking paths of anything that might make someone trip, such as rocks or tools. Regularly check to see if handrails are loose or broken. Make sure that both sides of any steps have handrails. Any raised decks and porches should have guardrails on the edges. Have any leaves, snow, or ice cleared regularly. Use sand or salt on walking paths during winter. Clean up any spills in your garage right away. This includes oil or grease spills. What can I do in the bathroom? Use night lights. Install grab bars by the toilet and in the tub and shower. Do not use towel bars as grab bars. Use non-skid mats or decals in the tub or shower. If you need to sit down in the shower, use a plastic, non-slip stool. Keep the floor dry. Clean up any water that spills on the floor as soon as it happens. Remove soap buildup in the tub or shower regularly. Attach bath mats securely with double-sided non-slip rug tape. Do not have throw rugs and other things on the floor that can make you trip. What can I do in the bedroom? Use night lights. Make  sure that you have a light by your bed that is easy to reach. Do not use any sheets or blankets that are too big for your bed. They should not hang down onto the floor. Have a firm chair that has side arms. You can use this for support while you get dressed. Do not have throw rugs and other things on the floor that can make you trip. What can I do in the kitchen? Clean up any spills right away. Avoid walking on wet floors. Keep items that you use a lot in easy-to-reach places. If you need to reach something above you, use a strong step stool that has a grab bar. Keep electrical cords out of the way. Do not use floor polish or wax that makes floors slippery. If you must use wax, use non-skid floor wax. Do not have throw rugs and other things on the floor that can make you trip. What can I do with my stairs? Do not leave any items on the stairs. Make sure that there are handrails on both sides of the stairs and use them. Fix handrails that are broken or loose. Make sure that handrails are as long as the stairways. Check any carpeting to make sure that it is firmly attached to the stairs. Fix any carpet that is loose or worn. Avoid having throw rugs at the top or bottom of  the stairs. If you do have throw rugs, attach them to the floor with carpet tape. Make sure that you have a light switch at the top of the stairs and the bottom of the stairs. If you do not have them, ask someone to add them for you. What else can I do to help prevent falls? Wear shoes that: Do not have high heels. Have rubber bottoms. Are comfortable and fit you well. Are closed at the toe. Do not wear sandals. If you use a stepladder: Make sure that it is fully opened. Do not climb a closed stepladder. Make sure that both sides of the stepladder are locked into place. Ask someone to hold it for you, if possible. Clearly mark and make sure that you can see: Any grab bars or handrails. First and last steps. Where the  edge of each step is. Use tools that help you move around (mobility aids) if they are needed. These include: Canes. Walkers. Scooters. Crutches. Turn on the lights when you go into a dark area. Replace any light bulbs as soon as they burn out. Set up your furniture so you have a clear path. Avoid moving your furniture around. If any of your floors are uneven, fix them. If there are any pets around you, be aware of where they are. Review your medicines with your doctor. Some medicines can make you feel dizzy. This can increase your chance of falling. Ask your doctor what other things that you can do to help prevent falls. This information is not intended to replace advice given to you by your health care provider. Make sure you discuss any questions you have with your health care provider. Document Released: 09/28/2009 Document Revised: 05/09/2016 Document Reviewed: 01/06/2015 Elsevier Interactive Patient Education  2017 [employer].

## 2021-05-25 ENCOUNTER — Ambulatory Visit: Payer: Medicare Other | Admitting: Internal Medicine

## 2021-05-25 ENCOUNTER — Other Ambulatory Visit: Payer: Self-pay | Admitting: Endocrinology

## 2021-05-25 DIAGNOSIS — J069 Acute upper respiratory infection, unspecified: Secondary | ICD-10-CM | POA: Diagnosis not present

## 2021-05-26 DIAGNOSIS — E1122 Type 2 diabetes mellitus with diabetic chronic kidney disease: Secondary | ICD-10-CM | POA: Diagnosis present

## 2021-05-26 DIAGNOSIS — I517 Cardiomegaly: Secondary | ICD-10-CM | POA: Diagnosis not present

## 2021-05-26 DIAGNOSIS — I5189 Other ill-defined heart diseases: Secondary | ICD-10-CM | POA: Diagnosis not present

## 2021-05-26 DIAGNOSIS — E1151 Type 2 diabetes mellitus with diabetic peripheral angiopathy without gangrene: Secondary | ICD-10-CM | POA: Diagnosis present

## 2021-05-26 DIAGNOSIS — J189 Pneumonia, unspecified organism: Secondary | ICD-10-CM | POA: Diagnosis present

## 2021-05-26 DIAGNOSIS — Z79891 Long term (current) use of opiate analgesic: Secondary | ICD-10-CM | POA: Diagnosis not present

## 2021-05-26 DIAGNOSIS — Z794 Long term (current) use of insulin: Secondary | ICD-10-CM | POA: Diagnosis not present

## 2021-05-26 DIAGNOSIS — I2510 Atherosclerotic heart disease of native coronary artery without angina pectoris: Secondary | ICD-10-CM | POA: Diagnosis present

## 2021-05-26 DIAGNOSIS — Z7982 Long term (current) use of aspirin: Secondary | ICD-10-CM | POA: Diagnosis not present

## 2021-05-26 DIAGNOSIS — Z7983 Long term (current) use of bisphosphonates: Secondary | ICD-10-CM | POA: Diagnosis not present

## 2021-05-26 DIAGNOSIS — Z20822 Contact with and (suspected) exposure to covid-19: Secondary | ICD-10-CM | POA: Diagnosis present

## 2021-05-26 DIAGNOSIS — K219 Gastro-esophageal reflux disease without esophagitis: Secondary | ICD-10-CM | POA: Diagnosis present

## 2021-05-26 DIAGNOSIS — Z79890 Hormone replacement therapy (postmenopausal): Secondary | ICD-10-CM | POA: Diagnosis not present

## 2021-05-26 DIAGNOSIS — J90 Pleural effusion, not elsewhere classified: Secondary | ICD-10-CM | POA: Diagnosis not present

## 2021-05-26 DIAGNOSIS — K8689 Other specified diseases of pancreas: Secondary | ICD-10-CM | POA: Diagnosis present

## 2021-05-26 DIAGNOSIS — N189 Chronic kidney disease, unspecified: Secondary | ICD-10-CM | POA: Diagnosis not present

## 2021-05-26 DIAGNOSIS — E039 Hypothyroidism, unspecified: Secondary | ICD-10-CM | POA: Diagnosis present

## 2021-05-26 DIAGNOSIS — E7800 Pure hypercholesterolemia, unspecified: Secondary | ICD-10-CM | POA: Diagnosis present

## 2021-05-26 DIAGNOSIS — R600 Localized edema: Secondary | ICD-10-CM | POA: Diagnosis present

## 2021-05-26 DIAGNOSIS — Z8507 Personal history of malignant neoplasm of pancreas: Secondary | ICD-10-CM | POA: Diagnosis not present

## 2021-05-26 DIAGNOSIS — I77810 Thoracic aortic ectasia: Secondary | ICD-10-CM | POA: Diagnosis not present

## 2021-05-26 DIAGNOSIS — E785 Hyperlipidemia, unspecified: Secondary | ICD-10-CM | POA: Diagnosis present

## 2021-05-26 DIAGNOSIS — K869 Disease of pancreas, unspecified: Secondary | ICD-10-CM | POA: Diagnosis not present

## 2021-05-26 DIAGNOSIS — N183 Chronic kidney disease, stage 3 unspecified: Secondary | ICD-10-CM | POA: Diagnosis present

## 2021-05-26 DIAGNOSIS — Z951 Presence of aortocoronary bypass graft: Secondary | ICD-10-CM | POA: Diagnosis not present

## 2021-05-26 DIAGNOSIS — I129 Hypertensive chronic kidney disease with stage 1 through stage 4 chronic kidney disease, or unspecified chronic kidney disease: Secondary | ICD-10-CM | POA: Diagnosis present

## 2021-05-26 DIAGNOSIS — Z66 Do not resuscitate: Secondary | ICD-10-CM | POA: Diagnosis present

## 2021-05-26 DIAGNOSIS — R0902 Hypoxemia: Secondary | ICD-10-CM | POA: Diagnosis present

## 2021-05-26 DIAGNOSIS — K8681 Exocrine pancreatic insufficiency: Secondary | ICD-10-CM | POA: Diagnosis not present

## 2021-05-26 DIAGNOSIS — M7989 Other specified soft tissue disorders: Secondary | ICD-10-CM | POA: Diagnosis not present

## 2021-05-26 DIAGNOSIS — R079 Chest pain, unspecified: Secondary | ICD-10-CM | POA: Diagnosis not present

## 2021-05-26 DIAGNOSIS — Z792 Long term (current) use of antibiotics: Secondary | ICD-10-CM | POA: Diagnosis not present

## 2021-05-29 ENCOUNTER — Ambulatory Visit: Payer: Medicare Other | Admitting: Internal Medicine

## 2021-05-29 DIAGNOSIS — Z951 Presence of aortocoronary bypass graft: Secondary | ICD-10-CM | POA: Diagnosis not present

## 2021-05-29 DIAGNOSIS — N200 Calculus of kidney: Secondary | ICD-10-CM | POA: Diagnosis not present

## 2021-05-29 DIAGNOSIS — Z9981 Dependence on supplemental oxygen: Secondary | ICD-10-CM | POA: Diagnosis not present

## 2021-05-29 DIAGNOSIS — E039 Hypothyroidism, unspecified: Secondary | ICD-10-CM | POA: Diagnosis not present

## 2021-05-29 DIAGNOSIS — I10 Essential (primary) hypertension: Secondary | ICD-10-CM | POA: Diagnosis not present

## 2021-05-29 DIAGNOSIS — K8681 Exocrine pancreatic insufficiency: Secondary | ICD-10-CM | POA: Diagnosis not present

## 2021-05-29 DIAGNOSIS — J189 Pneumonia, unspecified organism: Secondary | ICD-10-CM | POA: Diagnosis not present

## 2021-05-29 DIAGNOSIS — I2510 Atherosclerotic heart disease of native coronary artery without angina pectoris: Secondary | ICD-10-CM | POA: Diagnosis not present

## 2021-05-29 DIAGNOSIS — K219 Gastro-esophageal reflux disease without esophagitis: Secondary | ICD-10-CM | POA: Diagnosis not present

## 2021-05-29 DIAGNOSIS — D136 Benign neoplasm of pancreas: Secondary | ICD-10-CM | POA: Diagnosis not present

## 2021-05-29 DIAGNOSIS — R600 Localized edema: Secondary | ICD-10-CM | POA: Diagnosis not present

## 2021-05-29 DIAGNOSIS — E785 Hyperlipidemia, unspecified: Secondary | ICD-10-CM | POA: Diagnosis not present

## 2021-05-29 DIAGNOSIS — Z794 Long term (current) use of insulin: Secondary | ICD-10-CM | POA: Diagnosis not present

## 2021-05-29 DIAGNOSIS — E1151 Type 2 diabetes mellitus with diabetic peripheral angiopathy without gangrene: Secondary | ICD-10-CM | POA: Diagnosis not present

## 2021-05-29 DIAGNOSIS — E1165 Type 2 diabetes mellitus with hyperglycemia: Secondary | ICD-10-CM | POA: Diagnosis not present

## 2021-05-29 DIAGNOSIS — Z7982 Long term (current) use of aspirin: Secondary | ICD-10-CM | POA: Diagnosis not present

## 2021-05-30 ENCOUNTER — Inpatient Hospital Stay: Payer: Medicare Other | Admitting: Internal Medicine

## 2021-05-31 DIAGNOSIS — K8681 Exocrine pancreatic insufficiency: Secondary | ICD-10-CM | POA: Diagnosis not present

## 2021-05-31 DIAGNOSIS — E1165 Type 2 diabetes mellitus with hyperglycemia: Secondary | ICD-10-CM | POA: Diagnosis not present

## 2021-05-31 DIAGNOSIS — E1151 Type 2 diabetes mellitus with diabetic peripheral angiopathy without gangrene: Secondary | ICD-10-CM | POA: Diagnosis not present

## 2021-05-31 DIAGNOSIS — I2510 Atherosclerotic heart disease of native coronary artery without angina pectoris: Secondary | ICD-10-CM | POA: Diagnosis not present

## 2021-05-31 DIAGNOSIS — J189 Pneumonia, unspecified organism: Secondary | ICD-10-CM | POA: Diagnosis not present

## 2021-05-31 DIAGNOSIS — I10 Essential (primary) hypertension: Secondary | ICD-10-CM | POA: Diagnosis not present

## 2021-06-01 ENCOUNTER — Telehealth: Payer: Self-pay

## 2021-06-01 ENCOUNTER — Telehealth: Payer: Self-pay | Admitting: Internal Medicine

## 2021-06-01 DIAGNOSIS — Z794 Long term (current) use of insulin: Secondary | ICD-10-CM

## 2021-06-01 DIAGNOSIS — Z9981 Dependence on supplemental oxygen: Secondary | ICD-10-CM

## 2021-06-01 DIAGNOSIS — K8681 Exocrine pancreatic insufficiency: Secondary | ICD-10-CM | POA: Diagnosis not present

## 2021-06-01 DIAGNOSIS — E1151 Type 2 diabetes mellitus with diabetic peripheral angiopathy without gangrene: Secondary | ICD-10-CM | POA: Diagnosis not present

## 2021-06-01 DIAGNOSIS — R600 Localized edema: Secondary | ICD-10-CM | POA: Diagnosis not present

## 2021-06-01 DIAGNOSIS — E785 Hyperlipidemia, unspecified: Secondary | ICD-10-CM | POA: Diagnosis not present

## 2021-06-01 DIAGNOSIS — Z7982 Long term (current) use of aspirin: Secondary | ICD-10-CM

## 2021-06-01 DIAGNOSIS — D136 Benign neoplasm of pancreas: Secondary | ICD-10-CM | POA: Diagnosis not present

## 2021-06-01 DIAGNOSIS — Z951 Presence of aortocoronary bypass graft: Secondary | ICD-10-CM

## 2021-06-01 DIAGNOSIS — I2510 Atherosclerotic heart disease of native coronary artery without angina pectoris: Secondary | ICD-10-CM | POA: Diagnosis not present

## 2021-06-01 DIAGNOSIS — K219 Gastro-esophageal reflux disease without esophagitis: Secondary | ICD-10-CM | POA: Diagnosis not present

## 2021-06-01 DIAGNOSIS — E1165 Type 2 diabetes mellitus with hyperglycemia: Secondary | ICD-10-CM | POA: Diagnosis not present

## 2021-06-01 DIAGNOSIS — E039 Hypothyroidism, unspecified: Secondary | ICD-10-CM | POA: Diagnosis not present

## 2021-06-01 DIAGNOSIS — I10 Essential (primary) hypertension: Secondary | ICD-10-CM | POA: Diagnosis not present

## 2021-06-01 DIAGNOSIS — N200 Calculus of kidney: Secondary | ICD-10-CM | POA: Diagnosis not present

## 2021-06-01 DIAGNOSIS — J189 Pneumonia, unspecified organism: Secondary | ICD-10-CM | POA: Diagnosis not present

## 2021-06-01 NOTE — Telephone Encounter (Signed)
Caller: Aaron Edelman [facility]) Call back # [phone]  FYI  When to do a visit at the patient's house, and it was informed by the Pt that his glucose was running 300-350, the patient might need medication adjustment. Please contact the patient directly.

## 2021-06-01 NOTE — Telephone Encounter (Signed)
Plan of care signed and faxed back to [facility] at [phone]. Form sent for scanning.

## 2021-06-01 NOTE — Telephone Encounter (Signed)
thx

## 2021-06-01 NOTE — Telephone Encounter (Signed)
Spoke w/ Aaron Edelman- informed that I'd let PCP know however they would need to contact [facility] for further recommendations- office number given.

## 2021-06-03 ENCOUNTER — Other Ambulatory Visit: Payer: Self-pay | Admitting: Internal Medicine

## 2021-06-04 ENCOUNTER — Ambulatory Visit (INDEPENDENT_AMBULATORY_CARE_PROVIDER_SITE_OTHER): Payer: Medicare Other | Admitting: Internal Medicine

## 2021-06-04 ENCOUNTER — Encounter: Payer: Self-pay | Admitting: Internal Medicine

## 2021-06-04 ENCOUNTER — Other Ambulatory Visit: Payer: Self-pay

## 2021-06-04 VITALS — BP 162/64 | HR 58 | Temp 97.6°F | Resp 18 | Ht 68.0 in | Wt 172.2 lb

## 2021-06-04 DIAGNOSIS — E1165 Type 2 diabetes mellitus with hyperglycemia: Secondary | ICD-10-CM | POA: Diagnosis not present

## 2021-06-04 DIAGNOSIS — N189 Chronic kidney disease, unspecified: Secondary | ICD-10-CM | POA: Diagnosis not present

## 2021-06-04 DIAGNOSIS — E1151 Type 2 diabetes mellitus with diabetic peripheral angiopathy without gangrene: Secondary | ICD-10-CM | POA: Diagnosis not present

## 2021-06-04 DIAGNOSIS — J189 Pneumonia, unspecified organism: Secondary | ICD-10-CM | POA: Diagnosis not present

## 2021-06-04 DIAGNOSIS — I10 Essential (primary) hypertension: Secondary | ICD-10-CM | POA: Diagnosis not present

## 2021-06-04 DIAGNOSIS — I2510 Atherosclerotic heart disease of native coronary artery without angina pectoris: Secondary | ICD-10-CM | POA: Diagnosis not present

## 2021-06-04 DIAGNOSIS — IMO0002 Reserved for concepts with insufficient information to code with codable children: Secondary | ICD-10-CM

## 2021-06-04 DIAGNOSIS — K8681 Exocrine pancreatic insufficiency: Secondary | ICD-10-CM | POA: Diagnosis not present

## 2021-06-04 LAB — CBC WITH DIFFERENTIAL/PLATELET
Basophils Absolute: 0.3 10*3/uL — ABNORMAL HIGH (ref 0.0–0.1)
Basophils Relative: 2.3 % (ref 0.0–3.0)
Eosinophils Absolute: 0.9 10*3/uL — ABNORMAL HIGH (ref 0.0–0.7)
Eosinophils Relative: 8 % — ABNORMAL HIGH (ref 0.0–5.0)
HCT: 29.3 % — ABNORMAL LOW (ref 39.0–52.0)
Hemoglobin: 9.5 g/dL — ABNORMAL LOW (ref 13.0–17.0)
Lymphocytes Relative: 13 % (ref 12.0–46.0)
Lymphs Abs: 1.5 10*3/uL (ref 0.7–4.0)
MCHC: 32.6 g/dL (ref 30.0–36.0)
MCV: 93 fl (ref 78.0–100.0)
Monocytes Absolute: 1 10*3/uL (ref 0.1–1.0)
Monocytes Relative: 9.3 % (ref 3.0–12.0)
Neutro Abs: 7.6 10*3/uL (ref 1.4–7.7)
Neutrophils Relative %: 67.4 % (ref 43.0–77.0)
Platelets: 507 10*3/uL — ABNORMAL HIGH (ref 150.0–400.0)
RBC: 3.15 Mil/uL — ABNORMAL LOW (ref 4.22–5.81)
RDW: 14.6 % (ref 11.5–15.5)
WBC: 11.2 10*3/uL — ABNORMAL HIGH (ref 4.0–10.5)

## 2021-06-04 LAB — BASIC METABOLIC PANEL
BUN: 19 mg/dL (ref 6–23)
CO2: 23 mEq/L (ref 19–32)
Calcium: 8.6 mg/dL (ref 8.4–10.5)
Chloride: 104 mEq/L (ref 96–112)
Creatinine, Ser: 1.96 mg/dL — ABNORMAL HIGH (ref 0.40–1.50)
GFR: 30.75 mL/min — ABNORMAL LOW (ref >60.00)
Glucose, Bld: 211 mg/dL — ABNORMAL HIGH (ref 70–99)
Potassium: 5.1 mEq/L (ref 3.5–5.1)
Sodium: 137 mEq/L (ref 135–145)

## 2021-06-04 MED ORDER — HYDRALAZINE HCL 25 MG PO TABS: 25.0000 mg | ORAL_TABLET | Freq: Three times a day (TID) | ORAL | 1 refills | Status: DC

## 2021-06-04 NOTE — Telephone Encounter (Signed)
Pt has appt today at 10:40am.

## 2021-06-04 NOTE — Telephone Encounter (Signed)
Appt later today.

## 2021-06-04 NOTE — Patient Instructions (Signed)
See medication list  Check the  blood pressure  BP GOAL is between 110/65 and  135/85. If it is consistently higher or lower, let me know  Check your blood sugars twice daily, call Dr. Loanne Drilling with results   GO TO THE LAB : Get the blood work     [place], [place] back for   a checkup in

## 2021-06-04 NOTE — Progress Notes (Signed)
Subjective:    Patient ID: Ethan Mcneil, male    DOB: Jun 18, 1934, 87 y.o.   MRN: [id number]  DOS:  06/04/2021 Type of visit - description: Hospital follow-up, here with his son Lanny Hurst  Admitted to the hospital, extensive chart review. Since he left the hospital he is at home, feeling better. Specifically denies fever chills He is not DOE with normal activities at home No nausea or vomiting. Cough has decreased, sputum is minimal He has home O2, O2 sats more than 90% typically. He has noted weight gain and also lower extremity edema.    Wt Readings from Last 3 Encounters:  06/04/21 172 lb 4 oz (78.1 kg)  05/24/21 171 lb 12.8 oz (77.9 kg)  05/11/21 162 lb 3.2 oz (73.6 kg)    Review of Systems See above   Past Medical History:  Diagnosis Date   Anemia    Arthritis    CAD (coronary artery disease)    CABG 1997; grafts patent @ cath [facility] 11/09   DM2 (diabetes mellitus, type 2) ([place])    Gout    after tick bite   History of kidney stones    HLD (hyperlipidemia)    HTN (hypertension)    essential nos   Hypothyroid    Pancreatic lesion    Postsurgical aortocoronary bypass status    PVD (peripheral vascular disease) ([place])    S/P herniorrhaphy    Tick fever 2012   [facility] [place]   Unspecified hyperplasia of prostate without urinary obstruction and other lower urinary tract symptoms (LUTS)    Urolithiasis 2015   hx of   Wears dentures    Wears glasses     Past Surgical History:  Procedure Laterality Date   BIOPSY  08/07/2020   Procedure: BIOPSY;  Surgeon: Irving Copas., MD;  Location: [facility];  Service: Gastroenterology;;   CORONARY ARTERY BYPASS GRAFT     x7 vessels 1997; cath 2002 and 2009; grafts patent    [place], URETEROSCOPY AND STENT PLACEMENT Right 02/21/2014   Procedure: CYSTOSCOPY WITH RIGHT  RETROGRADE PYELOGRAM,RIGHT  URETEROSCOPY WITH STONE BASKETING EXTRACTION;  Surgeon: Irine Seal, MD;   Location: [facility];  Service: Urology;  Laterality: Right;   ESOPHAGOGASTRODUODENOSCOPY (EGD) WITH PROPOFOL N/A 08/07/2020   Procedure: ESOPHAGOGASTRODUODENOSCOPY (EGD) WITH PROPOFOL;  Surgeon: Rush Landmark Telford Nab., MD;  Location: [facility];  Service: Gastroenterology;  Laterality: N/A;   EUS N/A 08/07/2020   Procedure: UPPER ENDOSCOPIC ULTRASOUND (EUS) LINEAR;  Surgeon: Irving Copas., MD;  Location: [facility];  Service: Gastroenterology;  Laterality: N/A;   FINE NEEDLE ASPIRATION N/A 08/07/2020   Procedure: FINE NEEDLE ASPIRATION (FNA) LINEAR;  Surgeon: Irving Copas., MD;  Location: [facility];  Service: Gastroenterology;  Laterality: N/A;   HERNIA REPAIR     INGUINAL HERNIA REPAIR     MULTIPLE TOOTH EXTRACTIONS     SHOULDER SURGERY     SPLENECTOMY, TOTAL N/A 10/26/2020   Procedure: SPLENECTOMY;  Surgeon: Stark Klein, MD;  Location: [facility];  Service: General;  Laterality: N/A;    Allergies as of 06/04/2021       Reactions   Ciprofloxacin Itching   Codeine Nausea And Vomiting        Medication List        Accurate as of June 04, 2021 10:41 AM. If you have any questions, ask your nurse or doctor.          acetaminophen 500 MG tablet Commonly known as:  TYLENOL Take 500 mg by mouth every 6 (six) hours as needed for moderate pain or headache.   amLODipine 5 MG tablet Commonly known as: NORVASC Take 2 tablets (10 mg total) by mouth daily.   aspirin EC 81 MG tablet Take 81 mg by mouth at bedtime.   B-12 1000 MCG Tabs Take 1,000 mcg by mouth daily.   B-D UF III MINI PEN NEEDLES 31G X 5 MM Misc Generic drug: Insulin Pen Needle 1 each by Other route 3 (three) times daily. E11.9   Basaglar KwikPen 100 UNIT/ML Inject 0.03 mLs (3 Units total) into the skin at bedtime.   FreeStyle Libre 2 Sensor Misc 1 Device by Does not apply route every 14 (fourteen) days.   FREESTYLE LITE test strip Generic drug: glucose blood CHECK BLOOD SUGAR NO MORE THAN  TWICE DAILY.   hydrALAZINE 10 MG tablet Commonly known as: APRESOLINE Take 2 tablets (20 mg total) by mouth 3 (three) times daily.   insulin lispro 100 UNIT/ML KwikPen Commonly known as: HumaLOG KwikPen 4 units with breakfast, 5 units with lunch, and 10 units with supper   levothyroxine 112 MCG tablet Commonly known as: SYNTHROID Take 1 tablet (112 mcg total) by mouth daily before lunch.   Lokelma 10 g Pack packet Generic drug: sodium zirconium cyclosilicate 10 g every 8 hours for 3 times.  Then 10 g daily.   metoprolol succinate 50 MG 24 hr tablet Commonly known as: TOPROL-XL Take 1 tablet (50 mg total) by mouth daily. Take with or immediately following a meal.   omeprazole 40 MG capsule Commonly known as: PRILOSEC Take 1 capsule (40 mg total) by mouth daily.   simvastatin 20 MG tablet Commonly known as: ZOCOR Take 1 tablet (20 mg total) by mouth at bedtime.   Vitamin D3 50 MCG (2000 UT) capsule Take 2,000 Units by mouth daily.   Zenpep 25000-79000 units Cpep Generic drug: Pancrelipase (Lip-Prot-Amyl) Take 2 capsules by mouth with every meal and snacks           Objective:   Physical Exam BP (!) 162/64 (BP Location: Left Arm, Patient Position: Sitting, Cuff Size: Small)   Pulse (!) 58   Temp 97.6 F (36.4 C) (Oral)   Resp 18   Ht 5\' 8"  (1.727 m)   Wt 172 lb 4 oz (78.1 kg)   SpO2 91%   BMI 26.19 kg/m  General:   Well developed, NAD, BMI noted. HEENT:  Normocephalic . Face symmetric, atraumatic Lungs:  Few bilateral, basal, dry crackles noted Normal respiratory effort, no intercostal retractions, no accessory muscle use. Heart: RRR,  no murmur.  Lower extremities: Wearing very good-quality compression stockings, I still feel some pitting edema worse on the left. Skin: Not pale. Not jaundice Neurologic:  alert & oriented X3.  Speech normal, gait appropriate for age and unassisted Psych--  Cognition and judgment appear intact.  Cooperative with  normal attention span and concentration.  Behavior appropriate. No anxious or depressed appearing.      Assessment      Assessment DM -- w/ CAD, no neuropathy, intolerant to metformin , sees ENDO HTN Hyperlipidemia Hypothyroidism DJD BPH, LUTS,h/o urolithiasis -- sees urology CV: --CAD, CABG 1997, cardiac cath 2009 --Peripheral vascular disease (no ABIs in the chart) --L leg edema, chronic, (-) DVT per [place] 2015 Gout GI: Pancreatic insufficiency dx 02-2017 Anemia: Mild, normal iron 2015 B12 deficiency, mild 2015  Wt loss:  2012 187 lb, 2014 173 lb , 2015 167 01-2017: EGD normal,  BX chronic active gastritis. Colonoscopy normal. Saw GI 02-2017: likely d/t Pancreatic insufficiency and poorly controlled diabetes. 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  Surgeries 10-2020  PLAN: Chart review: Admitted to hospital 05/26/2021, was discharged June 13: Admitted with difficulty breathing, he was hypoxic, CXR showed R opacity. Admitted with a diagnosis of community-acquired pneumonia. He also had lower extremity edema felt to be due to recent DC of hydrochlorothiazide Prior to admission was found to be hyperkalemic, upon admission potassium was okay and Lokelma stopped. Kidney function was at baseline. At the time of discharge he was taking Basaglar 3 units, Last labs: Creatinine 2.1, potassium 4.4.  Magnesium 1.8.  CBC with a hemoglobin of 8.7 and WBC of 12.3.  Platelet count 175. [place] (-) DVT ECHO: Normal EF, more than 55%.  Grade 2 diastolic dysfunction. Pneumonia: Admitted to hospital, doing better now, finish antibiotics, no fever chills.  We will get a chest x-ray in 3 weeks.  He now has oxygen at home, O2 sat more than 90% per patient.  CAP: seems to be improviing, CXR on RTC , continue home O2 HTN: Since last visit was admitted to the hospital and  discharged on the following medications Amlodipine 5 mg: 2 tablets daily Hydralazine 10 mg: 2 tablets 3 times a day >>>>> increase to 25 mg TID metoprolol XL 50 mg daily. BP today 162/64, similar readings at home.  Plan: Increase hydralazine to 25 mg 3 times daily DM: he is currently doing Humalog 8 units at nighttime (he was supposed to be taking 03-20-09). See recent phone note, CBGs elevated, Endo recommended to increase Basaglar from 3 units to 6 units.  Patient aware CKD: checking labs, see meds above. To see nephrology 06/19/21. Wt gain noted since HCTZ d/c d/t hyperkalemia, lokelma was d/c @ [facility]. Wait for labs, consider lasix 20 mg qod  RTC  3 weeks     This visit occurred during the SARS-CoV-2 public health emergency.  Safety protocols were in place, including screening questions prior to the visit, additional usage of staff PPE, and extensive cleaning of exam room while observing appropriate contact time as indicated for disinfecting solutions.

## 2021-06-04 NOTE — Telephone Encounter (Signed)
Advise patient, Endo recommend to increase Basaglar to 6 units at night, recommend to contact Endo if blood sugars remain elevated

## 2021-06-05 NOTE — Assessment & Plan Note (Addendum)
Chart review: Admitted to hospital 05/26/2021, was discharged June 13: Admitted with difficulty breathing, he was hypoxic, CXR showed R opacity. Admitted with a diagnosis of community-acquired pneumonia. He also had lower extremity edema felt to be due to recent DC of hydrochlorothiazide Prior to admission was found to be hyperkalemic, upon admission potassium was okay and Lokelma stopped. Kidney function was at baseline. At the time of discharge he was taking Basaglar 3 units, Last labs: Creatinine 2.1, potassium 4.4.  Magnesium 1.8.  CBC with a hemoglobin of 8.7 and WBC of 12.3.  Platelet count 175. [place] (-) DVT ECHO: Normal EF, more than 55%.  Grade 2 diastolic dysfunction. Pneumonia: Admitted to hospital, doing better now, finish antibiotics, no fever chills.  We will get a chest x-ray in 3 weeks.  He now has oxygen at home, O2 sat more than 90% per patient.  CAP: seems to be improviing, CXR on RTC , continue home O2 HTN: Since last visit was admitted to the hospital and discharged on the following medications Amlodipine 5 mg: 2 tablets daily Hydralazine 10 mg: 2 tablets 3 times a day >>>>> increase to 25 mg TID metoprolol XL 50 mg daily. BP today 162/64, similar readings at home.  Plan: Increase hydralazine to 25 mg 3 times daily DM: he is currently doing Humalog 8 units at nighttime (he was supposed to be taking 03-20-09). See recent phone note, CBGs elevated, Endo recommended to increase Basaglar from 3 units to 6 units.  Patient aware CKD: checking labs, see meds above. To see nephrology 06/19/21. Wt gain noted since HCTZ d/c d/t hyperkalemia, lokelma was d/c @ [facility]. Wait for labs, consider lasix 20 mg qod  RTC  3 weeks

## 2021-06-06 DIAGNOSIS — E1165 Type 2 diabetes mellitus with hyperglycemia: Secondary | ICD-10-CM | POA: Diagnosis not present

## 2021-06-06 DIAGNOSIS — J189 Pneumonia, unspecified organism: Secondary | ICD-10-CM | POA: Diagnosis not present

## 2021-06-06 DIAGNOSIS — I10 Essential (primary) hypertension: Secondary | ICD-10-CM | POA: Diagnosis not present

## 2021-06-06 DIAGNOSIS — E1151 Type 2 diabetes mellitus with diabetic peripheral angiopathy without gangrene: Secondary | ICD-10-CM | POA: Diagnosis not present

## 2021-06-06 DIAGNOSIS — I2510 Atherosclerotic heart disease of native coronary artery without angina pectoris: Secondary | ICD-10-CM | POA: Diagnosis not present

## 2021-06-06 DIAGNOSIS — K8681 Exocrine pancreatic insufficiency: Secondary | ICD-10-CM | POA: Diagnosis not present

## 2021-06-06 MED ORDER — FUROSEMIDE 20 MG PO TABS: 20.0000 mg | ORAL_TABLET | ORAL | 0 refills | Status: DC

## 2021-06-06 NOTE — Addendum Note (Signed)
Addended byDamita Dunnings D on: 06/06/2021 07:55 AM   Modules accepted: Orders

## 2021-06-08 DIAGNOSIS — J189 Pneumonia, unspecified organism: Secondary | ICD-10-CM | POA: Diagnosis not present

## 2021-06-08 DIAGNOSIS — I2510 Atherosclerotic heart disease of native coronary artery without angina pectoris: Secondary | ICD-10-CM | POA: Diagnosis not present

## 2021-06-08 DIAGNOSIS — K8681 Exocrine pancreatic insufficiency: Secondary | ICD-10-CM | POA: Diagnosis not present

## 2021-06-08 DIAGNOSIS — E1165 Type 2 diabetes mellitus with hyperglycemia: Secondary | ICD-10-CM | POA: Diagnosis not present

## 2021-06-08 DIAGNOSIS — E1151 Type 2 diabetes mellitus with diabetic peripheral angiopathy without gangrene: Secondary | ICD-10-CM | POA: Diagnosis not present

## 2021-06-08 DIAGNOSIS — I10 Essential (primary) hypertension: Secondary | ICD-10-CM | POA: Diagnosis not present

## 2021-06-14 ENCOUNTER — Other Ambulatory Visit: Payer: Self-pay

## 2021-06-14 ENCOUNTER — Telehealth: Payer: Self-pay

## 2021-06-14 DIAGNOSIS — U071 COVID-19: Secondary | ICD-10-CM

## 2021-06-14 NOTE — Telephone Encounter (Signed)
Orders placed for MAB infusion.

## 2021-06-14 NOTE — Telephone Encounter (Signed)
Patient had a positive covid test yesterday was seen 06-04-21 for pneumonia. He finished antibiotics for pneumonia a week ago.  He is still having congestion, some cough, body aches for 2 days.  Patient asking if he needs any additional medication.  Please advise.

## 2021-06-14 NOTE — Telephone Encounter (Signed)
Patient called to report he had a positive at home covid test today. He was last seen 06-04-21 for pneumonia. Finished antibiotics a week ago. He is still having congestion and cough. New symptom of body aches for 2 days. Patient will like to know if he needs any medication for this. Please advise.

## 2021-06-14 NOTE — Telephone Encounter (Signed)
Patient is recovering from pneumonia (COVID-negative at that time), he is at home. Yesterday he started to "go backwards, like the flu", had some myalgias, his wife has a runny nose. They both tested positive for COVID yesterday. No fever chills O2 sat on oxygen supplements is is staying above 90.  No chest pain Still coughing from previous pneumonia. History of 3 COVID vaccines. He has CKD, he is at high risk of getting sick given his background and recent pneumonia Plan: Monoclonal antibody infusion, order to be placed Rest, fluids, ER if O2 sat under 90%, high fever, feeling much worse. He verbalized understanding

## 2021-06-15 ENCOUNTER — Ambulatory Visit (INDEPENDENT_AMBULATORY_CARE_PROVIDER_SITE_OTHER): Payer: Medicare Other | Admitting: *Deleted

## 2021-06-15 DIAGNOSIS — U071 COVID-19: Secondary | ICD-10-CM | POA: Diagnosis not present

## 2021-06-15 MED ORDER — EPINEPHRINE 0.3 MG/0.3ML IJ SOAJ: 0.3000 mg | Freq: Once | INTRAMUSCULAR | Status: AC | PRN

## 2021-06-15 MED ORDER — ALBUTEROL SULFATE HFA 108 (90 BASE) MCG/ACT IN AERS: 2.0000 | INHALATION_SPRAY | Freq: Once | RESPIRATORY_TRACT | Status: AC | PRN

## 2021-06-15 MED ORDER — METHYLPREDNISOLONE SODIUM SUCC 125 MG IJ SOLR: 125.0000 mg | Freq: Once | INTRAMUSCULAR | Status: AC | PRN

## 2021-06-15 MED ORDER — FAMOTIDINE IN NACL 20-0.9 MG/50ML-% IV SOLN: 20.0000 mg | Freq: Once | INTRAVENOUS | Status: AC | PRN

## 2021-06-15 MED ORDER — SODIUM CHLORIDE 0.9 % IV SOLN: INTRAVENOUS | Status: DC | PRN

## 2021-06-15 MED ORDER — BEBTELOVIMAB 175 MG/2 ML IV (EUA)
175.0000 mg | Freq: Once | INTRAMUSCULAR | Status: AC
  Administered 2021-06-15: 175 mg via INTRAVENOUS

## 2021-06-15 MED ORDER — DIPHENHYDRAMINE HCL 50 MG/ML IJ SOLN: 50.0000 mg | Freq: Once | INTRAMUSCULAR | Status: AC | PRN

## 2021-06-15 NOTE — Patient Instructions (Signed)
10 Things You Can Do to Manage Your COVID-19 Symptoms at Home If you have possible or confirmed COVID-19 Stay home except to get medical care. Monitor your symptoms carefully. If your symptoms get worse, call your healthcare provider immediately. Get rest and stay hydrated. If you have a medical appointment, call the healthcare provider ahead of time and tell them that you have or may have COVID-19. For medical emergencies, call 911 and notify the dispatch personnel that you have or may have COVID-19. Cover your cough and sneezes with a tissue or use the inside of your elbow. Wash your hands often with soap and water for at least 20 seconds or clean your hands with an alcohol-based hand sanitizer that contains at least 60% alcohol. As much as possible, stay in a specific room and away from other people in your home. Also, you should use a separate bathroom, if available. If you need to be around other people in or outside of the home, wear a mask. Avoid sharing personal items with other people in your household, like dishes, towels, and bedding. Clean all surfaces that are touched often, like counters, tabletops, and doorknobs. Use household cleaning sprays or wipes according to the label instructions. cdc.gov/coronavirus 06/30/2020 This information is not intended to replace advice given to you by your health care provider. Make sure you discuss any questions you have with your healthcare provider. Document Revised: 01/19/2021 Document Reviewed: 01/19/2021 Elsevier Patient Education  2022 [employer]  

## 2021-06-15 NOTE — Progress Notes (Signed)
Diagnosis: COVID  Provider:  Marshell Garfinkel, MD  Procedure: Infusion  IV Type: Peripheral, IV Location: L Antecubital  Bebtelovimab, Dose: 175 mg  Infusion Start Time: 5643  Infusion Stop Time: 3295  Post Infusion IV Care: Observation period completed  Discharge: Condition: Good, Destination: Home . AVS provided to patient.   Performed by:  Oren Beckmann, RN

## 2021-06-15 NOTE — Telephone Encounter (Signed)
Spoke with the patient, to have the monoclonal antibody infusion today at 1.30 pm

## 2021-06-16 ENCOUNTER — Other Ambulatory Visit: Payer: Self-pay | Admitting: Internal Medicine

## 2021-06-20 ENCOUNTER — Other Ambulatory Visit: Payer: Self-pay | Admitting: Family Medicine

## 2021-06-20 DIAGNOSIS — I2510 Atherosclerotic heart disease of native coronary artery without angina pectoris: Secondary | ICD-10-CM | POA: Diagnosis not present

## 2021-06-20 DIAGNOSIS — I10 Essential (primary) hypertension: Secondary | ICD-10-CM

## 2021-06-20 DIAGNOSIS — E1151 Type 2 diabetes mellitus with diabetic peripheral angiopathy without gangrene: Secondary | ICD-10-CM | POA: Diagnosis not present

## 2021-06-20 DIAGNOSIS — E1165 Type 2 diabetes mellitus with hyperglycemia: Secondary | ICD-10-CM | POA: Diagnosis not present

## 2021-06-20 DIAGNOSIS — J189 Pneumonia, unspecified organism: Secondary | ICD-10-CM | POA: Diagnosis not present

## 2021-06-20 DIAGNOSIS — K8681 Exocrine pancreatic insufficiency: Secondary | ICD-10-CM | POA: Diagnosis not present

## 2021-06-22 DIAGNOSIS — I2510 Atherosclerotic heart disease of native coronary artery without angina pectoris: Secondary | ICD-10-CM | POA: Diagnosis not present

## 2021-06-22 DIAGNOSIS — I10 Essential (primary) hypertension: Secondary | ICD-10-CM | POA: Diagnosis not present

## 2021-06-22 DIAGNOSIS — K8681 Exocrine pancreatic insufficiency: Secondary | ICD-10-CM | POA: Diagnosis not present

## 2021-06-22 DIAGNOSIS — E1151 Type 2 diabetes mellitus with diabetic peripheral angiopathy without gangrene: Secondary | ICD-10-CM | POA: Diagnosis not present

## 2021-06-22 DIAGNOSIS — E1165 Type 2 diabetes mellitus with hyperglycemia: Secondary | ICD-10-CM | POA: Diagnosis not present

## 2021-06-22 DIAGNOSIS — J189 Pneumonia, unspecified organism: Secondary | ICD-10-CM | POA: Diagnosis not present

## 2021-06-25 ENCOUNTER — Encounter: Payer: Self-pay | Admitting: Internal Medicine

## 2021-06-25 ENCOUNTER — Other Ambulatory Visit: Payer: Self-pay

## 2021-06-25 ENCOUNTER — Ambulatory Visit (INDEPENDENT_AMBULATORY_CARE_PROVIDER_SITE_OTHER): Payer: Medicare Other | Admitting: Internal Medicine

## 2021-06-25 VITALS — BP 136/90 | HR 48 | Temp 97.6°F | Resp 16 | Ht 68.0 in | Wt 160.2 lb

## 2021-06-25 DIAGNOSIS — I10 Essential (primary) hypertension: Secondary | ICD-10-CM

## 2021-06-25 DIAGNOSIS — U071 COVID-19: Secondary | ICD-10-CM | POA: Diagnosis not present

## 2021-06-25 DIAGNOSIS — E038 Other specified hypothyroidism: Secondary | ICD-10-CM

## 2021-06-25 DIAGNOSIS — N189 Chronic kidney disease, unspecified: Secondary | ICD-10-CM | POA: Diagnosis not present

## 2021-06-25 NOTE — Patient Instructions (Signed)
    GO TO THE FRONT DESK, PLEASE SCHEDULE YOUR APPOINTMENTS Come back for  a visit in 3 months

## 2021-06-25 NOTE — Progress Notes (Signed)
Subjective:    Patient ID: Ethan Mcneil, male    DOB: 19-Sep-1934, 87 y.o.   MRN: [id number]  DOS:  06/25/2021 Type of visit - description: Follow-up  Was seen at this office for hospital follow-up 06/04/2021, status post an admission for community-acquired pneumonia.  Labs were okay, he was recommended Lasix every other day to help with swelling.  Subsequently, he was diagnosed with COVID, I call him prescribed a monoclonal antibody infusion on 06/14/2021.  At this point he is feeling much improved. Still has some chest congestion Not using oxygen at home.  No  fever chills No chest pain, no difficulty breathing. No DOE, able to do all his activities of daily living without any problems.  Review of Systems See above   Past Medical History:  Diagnosis Date   Anemia    Arthritis    CAD (coronary artery disease)    CABG 1997; grafts patent @ cath [facility] 11/09   DM2 (diabetes mellitus, type 2) ([place])    Gout    after tick bite   History of kidney stones    HLD (hyperlipidemia)    HTN (hypertension)    essential nos   Hypothyroid    Pancreatic lesion    Postsurgical aortocoronary bypass status    PVD (peripheral vascular disease) ([place])    S/P herniorrhaphy    Tick fever 2012   [facility] [place]   Unspecified hyperplasia of prostate without urinary obstruction and other lower urinary tract symptoms (LUTS)    Urolithiasis 2015   hx of   Wears dentures    Wears glasses     Past Surgical History:  Procedure Laterality Date   BIOPSY  08/07/2020   Procedure: BIOPSY;  Surgeon: Irving Copas., MD;  Location: [place];  Service: Gastroenterology;;   CORONARY ARTERY BYPASS GRAFT     x7 vessels 1997; cath 2002 and 2009; grafts patent    CYSTOSCOPY WITH RETROGRADE PYELOGRAM, URETEROSCOPY AND STENT PLACEMENT Right 02/21/2014   Procedure: CYSTOSCOPY WITH RIGHT  RETROGRADE PYELOGRAM,RIGHT  URETEROSCOPY WITH STONE BASKETING EXTRACTION;  Surgeon: Irine Seal,  MD;  Location: [facility];  Service: Urology;  Laterality: Right;   ESOPHAGOGASTRODUODENOSCOPY (EGD) WITH PROPOFOL N/A 08/07/2020   Procedure: ESOPHAGOGASTRODUODENOSCOPY (EGD) WITH PROPOFOL;  Surgeon: Rush Landmark Telford Nab., MD;  Location: [place];  Service: Gastroenterology;  Laterality: N/A;   EUS N/A 08/07/2020   Procedure: UPPER ENDOSCOPIC ULTRASOUND (EUS) LINEAR;  Surgeon: Irving Copas., MD;  Location: [place];  Service: Gastroenterology;  Laterality: N/A;   FINE NEEDLE ASPIRATION N/A 08/07/2020   Procedure: FINE NEEDLE ASPIRATION (FNA) LINEAR;  Surgeon: Irving Copas., MD;  Location: [place];  Service: Gastroenterology;  Laterality: N/A;   HERNIA REPAIR     INGUINAL HERNIA REPAIR     MULTIPLE TOOTH EXTRACTIONS     SHOULDER SURGERY     SPLENECTOMY, TOTAL N/A 10/26/2020   Procedure: SPLENECTOMY;  Surgeon: Stark Klein, MD;  Location: [place];  Service: General;  Laterality: N/A;    Allergies as of 06/25/2021       Reactions   Ciprofloxacin Itching   Codeine Nausea And Vomiting        Medication List        Accurate as of June 25, 2021 11:59 PM. If you have any questions, ask your nurse or doctor.          acetaminophen 500 MG tablet Commonly known as: TYLENOL Take 500 mg by mouth every 6 (six) hours  as needed for moderate pain or headache.   amLODipine 5 MG tablet Commonly known as: NORVASC Take 2 tablets (10 mg total) by mouth daily.   aspirin EC 81 MG tablet Take 81 mg by mouth at bedtime.   B-12 1000 MCG Tabs Take 1,000 mcg by mouth daily.   B-D UF III MINI PEN NEEDLES 31G X 5 MM Misc Generic drug: Insulin Pen Needle 1 each by Other route 3 (three) times daily. E11.9   Basaglar KwikPen 100 UNIT/ML Inject 6 Units into the skin at bedtime.   FreeStyle Libre 2 Sensor Misc 1 Device by Does not apply route every 14 (fourteen) days.   FREESTYLE LITE test strip Generic drug: glucose blood CHECK BLOOD SUGAR NO MORE THAN TWICE  DAILY.   furosemide 20 MG tablet Commonly known as: LASIX Take 1 tablet (20 mg total) by mouth every other day.   hydrALAZINE 25 MG tablet Commonly known as: APRESOLINE Take 1 tablet (25 mg total) by mouth 3 (three) times daily.   insulin lispro 100 UNIT/ML KwikPen Commonly known as: HUMALOG 5 units at breakfast, 6 unit at lunch , 8 units at supper   levothyroxine 112 MCG tablet Commonly known as: SYNTHROID Take 1 tablet (112 mcg total) by mouth daily before lunch.   metoprolol succinate 50 MG 24 hr tablet Commonly known as: TOPROL-XL Take 1 tablet (50 mg total) by mouth daily. Take with or immediately following a meal.   omeprazole 40 MG capsule Commonly known as: PRILOSEC Take 1 capsule (40 mg total) by mouth daily.   simvastatin 20 MG tablet Commonly known as: ZOCOR Take 1 tablet (20 mg total) by mouth at bedtime.   Vitamin D3 50 MCG (2000 UT) capsule Take 2,000 Units by mouth daily.   Zenpep 25000-79000 units Cpep Generic drug: Pancrelipase (Lip-Prot-Amyl) Take 2 capsules by mouth with every meal and snacks           Objective:   Physical Exam BP 136/90 (BP Location: Left Arm, Patient Position: Sitting, Cuff Size: Small)   Pulse (!) 48   Temp 97.6 F (36.4 C) (Oral)   Resp 16   Ht 5\' 8"  (1.727 m)   Wt 160 lb 4 oz (72.7 kg)   SpO2 93%   BMI 24.37 kg/m  General:   Well developed, NAD, BMI noted.  Seems to be stronger and essentially back to baseline HEENT:  Normocephalic . Face symmetric, atraumatic Lungs:  CTA B Normal respiratory effort, no intercostal retractions, no accessory muscle use. Heart: RRR,  no murmur.  Lower extremities: Some edema, worse on the left, this is a chronic issue Skin: Not pale. Not jaundice Neurologic:  alert & oriented X3.  Speech normal, gait appropriate for age and unassisted Psych--  Cognition and judgment appear intact.  Cooperative with normal attention span and concentration.  Behavior appropriate. No anxious  or depressed appearing.      Assessment      Assessment DM -- w/ CAD, no neuropathy, intolerant to metformin , sees ENDO HTN Hyperlipidemia Hypothyroidism DJD BPH, LUTS,h/o urolithiasis -- sees urology CKD: started to see renal 06/2020 CV: --CAD, CABG 1997, cardiac cath 2009 --Peripheral vascular disease (no ABIs in the chart) --L leg edema, chronic, (-) DVT per [place] 2015 Gout GI: Pancreatic insufficiency dx 02-2017 Anemia: Mild, normal iron 2015 B12 deficiency, mild 2015  Wt loss:  2012 187 lb, 2014 173 lb , 2015 167 01-2017: EGD normal, BX chronic active gastritis. Colonoscopy normal. Saw GI 02-2017: likely d/t  Pancreatic insufficiency and poorly controlled diabetes. 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  Surgeries 10-2020 Covid dx 05/2021    PLAN: Community-acquired pneumonia: Improved. COVID: Diagnosed shortly after his admission for CAP, got MOAB infusion, he is actually feeling better , not requiring oxygen at home, O2 sat on RA: Around 94%. HTN:At home seems to be elevated in the 150s to 160s range.  Here today is 136/90. For now continue hydralazine, metoprolol, amlodipine, Lasix every other day as needed for edema. Hypothyroidism: Well balanced on levothyroxine CKD: Next visit with renal 07/05/2021. Preventive care: Needs COVID-vaccine 3 months after MOAB infusion.  Patient aware RTC few months      This visit occurred during the SARS-CoV-2 public health emergency.  Safety protocols were in place, including screening questions prior to the visit, additional usage of staff PPE, and extensive cleaning of exam room while observing appropriate contact time as indicated for disinfecting solutions.

## 2021-06-26 DIAGNOSIS — I10 Essential (primary) hypertension: Secondary | ICD-10-CM | POA: Diagnosis not present

## 2021-06-26 DIAGNOSIS — J189 Pneumonia, unspecified organism: Secondary | ICD-10-CM | POA: Diagnosis not present

## 2021-06-26 DIAGNOSIS — K8681 Exocrine pancreatic insufficiency: Secondary | ICD-10-CM | POA: Diagnosis not present

## 2021-06-26 DIAGNOSIS — I2510 Atherosclerotic heart disease of native coronary artery without angina pectoris: Secondary | ICD-10-CM | POA: Diagnosis not present

## 2021-06-26 DIAGNOSIS — E1151 Type 2 diabetes mellitus with diabetic peripheral angiopathy without gangrene: Secondary | ICD-10-CM | POA: Diagnosis not present

## 2021-06-26 DIAGNOSIS — E1165 Type 2 diabetes mellitus with hyperglycemia: Secondary | ICD-10-CM | POA: Diagnosis not present

## 2021-06-26 NOTE — Assessment & Plan Note (Signed)
Community-acquired pneumonia: Improved. COVID: Diagnosed shortly after his admission for CAP, got MOAB infusion, he is actually feeling better , not requiring oxygen at home, O2 sat on RA: Around 94%. HTN:At home seems to be elevated in the 150s to 160s range.  Here today is 136/90. For now continue hydralazine, metoprolol, amlodipine, Lasix every other day as needed for edema. Hypothyroidism: Well balanced on levothyroxine CKD: Next visit with renal 07/05/2021. Preventive care: Needs COVID-vaccine 3 months after MOAB infusion.  Patient aware RTC few months

## 2021-06-28 DIAGNOSIS — Z794 Long term (current) use of insulin: Secondary | ICD-10-CM | POA: Diagnosis not present

## 2021-06-28 DIAGNOSIS — Z951 Presence of aortocoronary bypass graft: Secondary | ICD-10-CM | POA: Diagnosis not present

## 2021-06-28 DIAGNOSIS — E1151 Type 2 diabetes mellitus with diabetic peripheral angiopathy without gangrene: Secondary | ICD-10-CM | POA: Diagnosis not present

## 2021-06-28 DIAGNOSIS — R600 Localized edema: Secondary | ICD-10-CM | POA: Diagnosis not present

## 2021-06-28 DIAGNOSIS — E039 Hypothyroidism, unspecified: Secondary | ICD-10-CM | POA: Diagnosis not present

## 2021-06-28 DIAGNOSIS — D136 Benign neoplasm of pancreas: Secondary | ICD-10-CM | POA: Diagnosis not present

## 2021-06-28 DIAGNOSIS — Z9981 Dependence on supplemental oxygen: Secondary | ICD-10-CM | POA: Diagnosis not present

## 2021-06-28 DIAGNOSIS — K219 Gastro-esophageal reflux disease without esophagitis: Secondary | ICD-10-CM | POA: Diagnosis not present

## 2021-06-28 DIAGNOSIS — I10 Essential (primary) hypertension: Secondary | ICD-10-CM | POA: Diagnosis not present

## 2021-06-28 DIAGNOSIS — Z7982 Long term (current) use of aspirin: Secondary | ICD-10-CM | POA: Diagnosis not present

## 2021-06-28 DIAGNOSIS — I2510 Atherosclerotic heart disease of native coronary artery without angina pectoris: Secondary | ICD-10-CM | POA: Diagnosis not present

## 2021-06-28 DIAGNOSIS — E785 Hyperlipidemia, unspecified: Secondary | ICD-10-CM | POA: Diagnosis not present

## 2021-06-28 DIAGNOSIS — E1165 Type 2 diabetes mellitus with hyperglycemia: Secondary | ICD-10-CM | POA: Diagnosis not present

## 2021-06-28 DIAGNOSIS — K8681 Exocrine pancreatic insufficiency: Secondary | ICD-10-CM | POA: Diagnosis not present

## 2021-06-28 DIAGNOSIS — J189 Pneumonia, unspecified organism: Secondary | ICD-10-CM | POA: Diagnosis not present

## 2021-06-28 DIAGNOSIS — N200 Calculus of kidney: Secondary | ICD-10-CM | POA: Diagnosis not present

## 2021-07-05 DIAGNOSIS — E559 Vitamin D deficiency, unspecified: Secondary | ICD-10-CM | POA: Diagnosis not present

## 2021-07-05 DIAGNOSIS — N189 Chronic kidney disease, unspecified: Secondary | ICD-10-CM | POA: Diagnosis not present

## 2021-07-05 DIAGNOSIS — E875 Hyperkalemia: Secondary | ICD-10-CM | POA: Diagnosis not present

## 2021-07-05 DIAGNOSIS — R609 Edema, unspecified: Secondary | ICD-10-CM | POA: Diagnosis not present

## 2021-07-05 DIAGNOSIS — D631 Anemia in chronic kidney disease: Secondary | ICD-10-CM | POA: Diagnosis not present

## 2021-07-05 DIAGNOSIS — I129 Hypertensive chronic kidney disease with stage 1 through stage 4 chronic kidney disease, or unspecified chronic kidney disease: Secondary | ICD-10-CM | POA: Diagnosis not present

## 2021-07-05 DIAGNOSIS — N1832 Chronic kidney disease, stage 3b: Secondary | ICD-10-CM | POA: Diagnosis not present

## 2021-07-05 LAB — IRON,TIBC AND FERRITIN PANEL
%SAT: 21
Ferritin: 85
Iron: 48
TIBC: 226
UIBC: 178

## 2021-07-05 LAB — BASIC METABOLIC PANEL
BUN: 23 — AB (ref 4–21)
CO2: 20 (ref 13–22)
Chloride: 108 (ref 99–108)
Creatinine: 1.9 — AB (ref 0.6–1.3)
Glucose: 125
Potassium: 5.7 — AB (ref 3.4–5.3)
Sodium: 137 (ref 137–147)

## 2021-07-05 LAB — COMPREHENSIVE METABOLIC PANEL
Albumin: 3.7 (ref 3.5–5.0)
Calcium: 8.6 — AB (ref 8.7–10.7)
GFR calc non Af Amer: 35

## 2021-07-05 LAB — CBC AND DIFFERENTIAL
HCT: 28 — AB (ref 41–53)
Hemoglobin: 9.1 — AB (ref 13.5–17.5)
Neutrophils Absolute: 3
Platelets: 253 (ref 150–399)
WBC: 6.7

## 2021-07-05 LAB — VITAMIN D 25 HYDROXY (VIT D DEFICIENCY, FRACTURES): Vit D, 25-Hydroxy: 22.2

## 2021-07-05 LAB — CBC: RBC: 3.13 — AB (ref 3.87–5.11)

## 2021-07-12 DIAGNOSIS — J189 Pneumonia, unspecified organism: Secondary | ICD-10-CM | POA: Diagnosis not present

## 2021-07-12 DIAGNOSIS — I2510 Atherosclerotic heart disease of native coronary artery without angina pectoris: Secondary | ICD-10-CM | POA: Diagnosis not present

## 2021-07-12 DIAGNOSIS — I10 Essential (primary) hypertension: Secondary | ICD-10-CM | POA: Diagnosis not present

## 2021-07-12 DIAGNOSIS — K8681 Exocrine pancreatic insufficiency: Secondary | ICD-10-CM | POA: Diagnosis not present

## 2021-07-12 DIAGNOSIS — E1165 Type 2 diabetes mellitus with hyperglycemia: Secondary | ICD-10-CM | POA: Diagnosis not present

## 2021-07-12 DIAGNOSIS — E1151 Type 2 diabetes mellitus with diabetic peripheral angiopathy without gangrene: Secondary | ICD-10-CM | POA: Diagnosis not present

## 2021-07-13 ENCOUNTER — Encounter: Payer: Self-pay | Admitting: Internal Medicine

## 2021-07-16 DIAGNOSIS — N1832 Chronic kidney disease, stage 3b: Secondary | ICD-10-CM | POA: Diagnosis not present

## 2021-07-17 ENCOUNTER — Other Ambulatory Visit: Payer: Self-pay | Admitting: Family Medicine

## 2021-07-17 DIAGNOSIS — I10 Essential (primary) hypertension: Secondary | ICD-10-CM

## 2021-07-24 DIAGNOSIS — I10 Essential (primary) hypertension: Secondary | ICD-10-CM | POA: Diagnosis not present

## 2021-07-24 DIAGNOSIS — J189 Pneumonia, unspecified organism: Secondary | ICD-10-CM | POA: Diagnosis not present

## 2021-07-24 DIAGNOSIS — I2510 Atherosclerotic heart disease of native coronary artery without angina pectoris: Secondary | ICD-10-CM | POA: Diagnosis not present

## 2021-07-24 DIAGNOSIS — K8681 Exocrine pancreatic insufficiency: Secondary | ICD-10-CM | POA: Diagnosis not present

## 2021-07-24 DIAGNOSIS — E1165 Type 2 diabetes mellitus with hyperglycemia: Secondary | ICD-10-CM | POA: Diagnosis not present

## 2021-07-24 DIAGNOSIS — E1151 Type 2 diabetes mellitus with diabetic peripheral angiopathy without gangrene: Secondary | ICD-10-CM | POA: Diagnosis not present

## 2021-07-26 DIAGNOSIS — I10 Essential (primary) hypertension: Secondary | ICD-10-CM | POA: Diagnosis not present

## 2021-07-29 ENCOUNTER — Other Ambulatory Visit: Payer: Self-pay | Admitting: Internal Medicine

## 2021-07-31 ENCOUNTER — Other Ambulatory Visit: Payer: Self-pay | Admitting: Endocrinology

## 2021-08-07 DIAGNOSIS — H25813 Combined forms of age-related cataract, bilateral: Secondary | ICD-10-CM | POA: Diagnosis not present

## 2021-08-07 DIAGNOSIS — H40003 Preglaucoma, unspecified, bilateral: Secondary | ICD-10-CM | POA: Diagnosis not present

## 2021-08-18 ENCOUNTER — Other Ambulatory Visit: Payer: Self-pay | Admitting: Endocrinology

## 2021-08-21 ENCOUNTER — Other Ambulatory Visit: Payer: Self-pay

## 2021-08-21 ENCOUNTER — Ambulatory Visit (INDEPENDENT_AMBULATORY_CARE_PROVIDER_SITE_OTHER): Payer: Medicare Other | Admitting: Endocrinology

## 2021-08-21 VITALS — BP 200/80 | HR 43 | Ht 68.0 in | Wt 162.0 lb

## 2021-08-21 DIAGNOSIS — E1151 Type 2 diabetes mellitus with diabetic peripheral angiopathy without gangrene: Secondary | ICD-10-CM

## 2021-08-21 DIAGNOSIS — E1165 Type 2 diabetes mellitus with hyperglycemia: Secondary | ICD-10-CM

## 2021-08-21 DIAGNOSIS — I10 Essential (primary) hypertension: Secondary | ICD-10-CM | POA: Diagnosis not present

## 2021-08-21 DIAGNOSIS — IMO0002 Reserved for concepts with insufficient information to code with codable children: Secondary | ICD-10-CM

## 2021-08-21 LAB — POCT GLYCOSYLATED HEMOGLOBIN (HGB A1C): Hemoglobin A1C: 5.9 % — AB (ref 4.0–5.6)

## 2021-08-21 MED ORDER — INSULIN LISPRO (1 UNIT DIAL) 100 UNIT/ML (KWIKPEN): PEN_INJECTOR | SUBCUTANEOUS | 3 refills | Status: DC

## 2021-08-21 MED ORDER — METOPROLOL SUCCINATE ER 50 MG PO TB24: 25.0000 mg | ORAL_TABLET | Freq: Every day | ORAL | 1 refills | Status: DC

## 2021-08-21 NOTE — Progress Notes (Signed)
Subjective:    Patient ID: Ethan Mcneil, male    DOB: Aug 27, 1934, 87 y.o.   MRN: [id number]  HPI Pt returns for f/u of diabetes mellitus: DM type: pancreatic insuff. Dx'ed: 4259 Complications: PN, CRI, CAD, and PAD.   Therapy: insulin since 2017 DKA: never Severe hypoglycemia: once (2021) Pancreatitis: once (2017) Pancreatic imaging: chronic atrophy with diffuse pancreatic duct dilatation.   SDOH: frail elderly state Other: he takes multiple daily injections; he declines pump; he had distal pancreatectomy in 2021.   Interval history: I reviewed continuous glucose monitor data, but there is no recent information.  Pt says glucose varies from 40-250.  It is in general lowest at HS.  No recent steroids.  He seldom has hypoglycemia, and these episodes are mild.  He says basaglar is 7 units qhs, and humalog is 5-6-8 units.   Past Medical History:  Diagnosis Date   Anemia    Arthritis    CAD (coronary artery disease)    CABG 1997; grafts patent @ cath [facility] 11/09   DM2 (diabetes mellitus, type 2) ([place])    Gout    after tick bite   History of kidney stones    HLD (hyperlipidemia)    HTN (hypertension)    essential nos   Hypothyroid    Pancreatic lesion    Postsurgical aortocoronary bypass status    PVD (peripheral vascular disease) ([place])    S/P herniorrhaphy    Tick fever 2012   [facility] , [place]   Unspecified hyperplasia of prostate without urinary obstruction and other lower urinary tract symptoms (LUTS)    Urolithiasis 2015   hx of   Wears dentures    Wears glasses     Past Surgical History:  Procedure Laterality Date   BIOPSY  08/07/2020   Procedure: BIOPSY;  Surgeon: Irving Copas., MD;  Location: [place];  Service: Gastroenterology;;   CORONARY ARTERY BYPASS GRAFT     x7 vessels 1997; cath 2002 and 2009; grafts patent    CYSTOSCOPY WITH RETROGRADE PYELOGRAM, URETEROSCOPY AND STENT PLACEMENT Right 02/21/2014   Procedure: CYSTOSCOPY WITH  RIGHT  RETROGRADE PYELOGRAM,RIGHT  URETEROSCOPY WITH STONE BASKETING EXTRACTION;  Surgeon: Irine Seal, MD;  [facility];  Service: Urology;  Laterality: Right;   ESOPHAGOGASTRODUODENOSCOPY (EGD) WITH PROPOFOL N/A 08/07/2020   Procedure: ESOPHAGOGASTRODUODENOSCOPY (EGD) WITH PROPOFOL;  Surgeon: Rush Landmark Telford Nab., MD;  Location: [place];  Service: Gastroenterology;  Laterality: N/A;   EUS N/A 08/07/2020   Procedure: UPPER ENDOSCOPIC ULTRASOUND (EUS) LINEAR;  Surgeon: Irving Copas., MD;  Location: [place];  Service: Gastroenterology;  Laterality: N/A;   FINE NEEDLE ASPIRATION N/A 08/07/2020   Procedure: FINE NEEDLE ASPIRATION (FNA) LINEAR;  Surgeon: Irving Copas., MD;  Location: [place];  Service: Gastroenterology;  Laterality: N/A;   HERNIA REPAIR     INGUINAL HERNIA REPAIR     MULTIPLE TOOTH EXTRACTIONS     SHOULDER SURGERY     SPLENECTOMY, TOTAL N/A 10/26/2020   Procedure: SPLENECTOMY;  Surgeon: Stark Klein, MD;  [facility];  Service: General;  Laterality: N/A;    Social History   Socioeconomic History   Marital status: Married    Spouse name: Pat   Number of children: 4   Years of education: Not on file   Highest education level: Not on file  Occupational History   Occupation: retired, works few hours   Tobacco Use   Smoking status: Never   Smokeless tobacco: Never  Media planner  Vaping Use: Never used  Substance and Sexual Activity   Alcohol use: No   Drug use: No   Sexual activity: Not Currently  Other Topics Concern   Not on file  Social History Narrative   Dealer; aviator and former race car driver   4 children (boys)   Radio producer form signed appointing wife - [name]; ok to leave msg on home phone [phone]         Social Determinants of Health   Financial Resource Strain: Low Risk    Difficulty of Paying Living Expenses: Not hard at all  Food Insecurity: No Food Insecurity   Worried About Ship broker in the Last Year: Never true   Arboriculturist in the Last Year: Never true  Transportation Needs: No Transportation Needs   Lack of Transportation (Medical): No   Lack of Transportation (Non-Medical): No  Physical Activity: Insufficiently Active   Days of Exercise per Week: 5 days   Minutes of Exercise per Session: 20 min  Stress: No Stress Concern Present   Feeling of Stress : Only a little  Social Connections: Moderately Integrated   Frequency of Communication with Friends and Family: More than three times a week   Frequency of Social Gatherings with Friends and Family: More than three times a week   Attends Religious Services: More than 4 times per year   Active Member of [employer] or Organizations: No   Attends Archivist Meetings: Never   Marital Status: Married  Human resources officer Violence: Not At Risk   Fear of Current or Ex-Partner: No   Emotionally Abused: No   Physically Abused: No   Sexually Abused: No    Current Outpatient Medications on File Prior to Visit  Medication Sig Dispense Refill   acetaminophen (TYLENOL) 500 MG tablet Take 500 mg by mouth every 6 (six) hours as needed for moderate pain or headache.     amLODipine (NORVASC) 5 MG tablet Take 2 tablets (10 mg total) by mouth daily. 180 tablet 0   aspirin EC 81 MG tablet Take 81 mg by mouth at bedtime.      B-D UF III MINI PEN NEEDLES 31G X 5 MM MISC USE 3 TIMES A DAY AS DIRECTED 100 each 2   Cholecalciferol (VITAMIN D3) 50 MCG (2000 UT) capsule Take 2,000 Units by mouth daily.     Continuous Blood Gluc Sensor (FREESTYLE LIBRE 14 DAY SENSOR) MISC USE 1 SENSOR EVERY 14 (FOURTEEN) DAYS. 6 each 3   Cyanocobalamin (B-12) 1000 MCG TABS Take 1,000 mcg by mouth daily.     furosemide (LASIX) 20 MG tablet Take 1 tablet (20 mg total) by mouth every other day. 45 tablet 1   glucose blood (FREESTYLE LITE) test strip CHECK BLOOD SUGAR NO MORE THAN TWICE DAILY. 200 strip 0   hydrALAZINE (APRESOLINE) 25 MG tablet  Take 1 tablet (25 mg total) by mouth 3 (three) times daily. 270 tablet 1   Insulin Glargine (BASAGLAR KWIKPEN) 100 UNIT/ML Inject 6 Units into the skin at bedtime.     levothyroxine (SYNTHROID) 112 MCG tablet Take 1 tablet (112 mcg total) by mouth daily before lunch. 90 tablet 1   omeprazole (PRILOSEC) 40 MG capsule Take 1 capsule (40 mg total) by mouth daily. 90 capsule 2   Pancrelipase, Lip-Prot-Amyl, (ZENPEP) 25000-79000 units CPEP Take 2 capsules by mouth with every meal and snacks 540 capsule 1   simvastatin (ZOCOR) 20 MG tablet Take 1 tablet (20  mg total) by mouth at bedtime. 90 tablet 1   No current facility-administered medications on file prior to visit.    Allergies  Allergen Reactions   Ciprofloxacin Itching   Codeine Nausea And Vomiting    Family History  Problem Relation Age of Onset   Lung cancer Father    Coronary artery disease Mother        stent   Diabetes Brother    Prostate cancer Maternal Uncle        in his 15s   Stroke Son        GF   Throat cancer Son    Colon cancer Neg Hx    Rectal cancer Neg Hx    Stomach cancer Neg Hx    Esophageal cancer Neg Hx    Pancreatic cancer Neg Hx     BP (!) 200/80 (BP Location: Right Arm, Patient Position: Sitting, Cuff Size: Normal)   Pulse (!) 43   Ht 5\' 8"  (1.727 m)   Wt 162 lb (73.5 kg)   SpO2 97%   BMI 24.63 kg/m    Review of Systems Denies LOC.  He has mild lightheadedness.     Objective:   Physical Exam Pulses: dorsalis pedis absent bilat (poss due to edema).     MSK: no deformity of the feet CV: 3+ bilat leg edema Skin:  no ulcer on the feet.  normal color and temp on the feet.   Neuro: sensation is intact to touch on the feet.   Ext: there is bilateral onychomycosis of the toenails  Lab Results  Component Value Date   CREATININE 1.9 (A) 07/05/2021   BUN 23 (A) 07/05/2021   NA 137 07/05/2021   K 5.7 (A) 07/05/2021   CL 108 07/05/2021   CO2 20 07/05/2021   Lab Results  Component Value Date    HGBA1C 5.9 (A) 08/21/2021       Assessment & Plan:  DM: overcontrolled Bradycardia, due to toprol.    Patient Instructions  Please skip 1 day of the metoprolol, then resume at 1/2 pill per day. Your blood pressure is high today.  Please see your primary care provider this week, to have it rechecked. check your blood sugar 4 times a day.  vary the time of day when you check, between before the 3 meals, and at bedtime.  also check if you have symptoms of your blood sugar being too high or too low.  please keep a record of the readings and bring it to your next appointment here (or you can bring the meter itself).  You can write it on any piece of paper.  please call us sooner if your blood sugar goes below 70, or if you have a lot of readings over 200.   Please reduce the humalog to 5 units with breakfast, 6 units with lunch, and 6 units with supper.   Please reduce the Basaglar to 6 units at bedtime.   Please come back for a follow-up appointment in January.

## 2021-08-21 NOTE — Patient Instructions (Addendum)
Please skip 1 day of the metoprolol, then resume at 1/2 pill per day. Your blood pressure is high today.  Please see your primary care provider this week, to have it rechecked. check your blood sugar 4 times a day.  vary the time of day when you check, between before the 3 meals, and at bedtime.  also check if you have symptoms of your blood sugar being too high or too low.  please keep a record of the readings and bring it to your next appointment here (or you can bring the meter itself).  You can write it on any piece of paper.  please call us sooner if your blood sugar goes below 70, or if you have a lot of readings over 200.   Please reduce the humalog to 5 units with breakfast, 6 units with lunch, and 6 units with supper.   Please reduce the Basaglar to 6 units at bedtime.   Please come back for a follow-up appointment in January.

## 2021-09-17 ENCOUNTER — Telehealth: Payer: Self-pay

## 2021-09-17 NOTE — Telephone Encounter (Signed)
Surgical clearance form received from [facility] in [place], [place]. Pt needing L cataract extraction w/ intraocular lens implantation on 10/08/21 with Dr. Leanora Ivanoff. Pt is scheduled w/ PCP on 09/25/21 for evaluation.

## 2021-09-25 ENCOUNTER — Ambulatory Visit (INDEPENDENT_AMBULATORY_CARE_PROVIDER_SITE_OTHER): Payer: Medicare Other | Admitting: Internal Medicine

## 2021-09-25 ENCOUNTER — Encounter: Payer: Self-pay | Admitting: Internal Medicine

## 2021-09-25 ENCOUNTER — Other Ambulatory Visit: Payer: Self-pay

## 2021-09-25 VITALS — BP 178/84 | HR 53 | Temp 97.7°F | Resp 18 | Ht 68.0 in | Wt 163.4 lb

## 2021-09-25 DIAGNOSIS — Z01818 Encounter for other preprocedural examination: Secondary | ICD-10-CM | POA: Diagnosis not present

## 2021-09-25 DIAGNOSIS — E039 Hypothyroidism, unspecified: Secondary | ICD-10-CM | POA: Diagnosis not present

## 2021-09-25 DIAGNOSIS — Z9081 Acquired absence of spleen: Secondary | ICD-10-CM | POA: Diagnosis not present

## 2021-09-25 DIAGNOSIS — I10 Essential (primary) hypertension: Secondary | ICD-10-CM | POA: Diagnosis not present

## 2021-09-25 DIAGNOSIS — E782 Mixed hyperlipidemia: Secondary | ICD-10-CM

## 2021-09-25 DIAGNOSIS — Z23 Encounter for immunization: Secondary | ICD-10-CM | POA: Diagnosis not present

## 2021-09-25 LAB — LIPID PANEL
Cholesterol: 109 mg/dL (ref 0–200)
HDL: 48.6 mg/dL (ref >39.00)
LDL Cholesterol: 43 mg/dL (ref 0–99)
NonHDL: 60.32
Total CHOL/HDL Ratio: 2
Triglycerides: 87 mg/dL (ref 0.0–149.0)
VLDL: 17.4 mg/dL (ref 0.0–40.0)

## 2021-09-25 LAB — TSH: TSH: 10.37 u[IU]/mL — ABNORMAL HIGH (ref 0.35–5.50)

## 2021-09-25 MED ORDER — AMLODIPINE BESYLATE 5 MG PO TABS: 10.0000 mg | ORAL_TABLET | Freq: Every day | ORAL | 1 refills | Status: DC

## 2021-09-25 NOTE — Telephone Encounter (Signed)
Pt cleared for surgery- form completed and faxed back to [facility] at [phone]. Form sent for scanning.

## 2021-09-25 NOTE — Assessment & Plan Note (Signed)
Surgical clearance: Patient is cleared (BP is elevated but we are restarting amlodipine, see next) DM: Per Endo HTN: Ran out of amlodipine about 2 months ago, BP has been elevated, amlodipine RF sent, anticipate BP will get better soon. He also takes Lasix, hydralazine, metoprolol. Hyperlipidemia: On simvastatin, checking labs Hypothyroidism: On Synthroid, checking labs CKD: To see renal in 2 weeks Asplenia: Immunizations reviewed --Td 2015 - PNM 23 : 2011; PNM 13: 11-2015.  PNM 20 today -zostavax: 2015 -Meningococcal: Needed -meningococcal B: Needed haemophilus influenza: Needed Will complete immunizations in the next visits Med compliance: I went over the medication list with the patient and his son and explained them what the medications are for. RTC 3 months

## 2021-09-25 NOTE — Progress Notes (Signed)
Subjective:    Patient ID: ONYX SCHIRMER, male    DOB: 1934/11/02, 87 y.o.   MRN: [id number]  DOS:  09/25/2021 Type of visit - description: Here for surgical clearance, here w/ his son  To have L cataract surgery.  Needs surgical clearance. Today I also review available labs and we talk about immunizations.  Wt Readings from Last 3 Encounters:  09/25/21 163 lb 6 oz (74.1 kg)  08/21/21 162 lb (73.5 kg)  06/25/21 160 lb 4 oz (72.7 kg)     Review of Systems Denies chest pain or difficulty breathing. He is able to do all his ADLs without shortness of breath or palpitations   Past Medical History:  Diagnosis Date   Anemia    Arthritis    CAD (coronary artery disease)    CABG 1997; grafts patent @ cath [facility] 11/09   DM2 (diabetes mellitus, type 2) ([place])    Gout    after tick bite   History of kidney stones    HLD (hyperlipidemia)    HTN (hypertension)    essential nos   Hypothyroid    Pancreatic lesion    Postsurgical aortocoronary bypass status    PVD (peripheral vascular disease) ([place])    S/P herniorrhaphy    Tick fever 2012   [facility] , [place]   Unspecified hyperplasia of prostate without urinary obstruction and other lower urinary tract symptoms (LUTS)    Urolithiasis 2015   hx of   Wears dentures    Wears glasses     Past Surgical History:  Procedure Laterality Date   BIOPSY  08/07/2020   Procedure: BIOPSY;  Surgeon: Irving Copas., MD;  Location: [place];  Service: Gastroenterology;;   CORONARY ARTERY BYPASS GRAFT     x7 vessels 1997; cath 2002 and 2009; grafts patent    [place], URETEROSCOPY AND STENT PLACEMENT Right 02/21/2014   Procedure: CYSTOSCOPY WITH RIGHT  RETROGRADE PYELOGRAM,RIGHT  URETEROSCOPY WITH STONE BASKETING EXTRACTION;  Surgeon: Irine Seal, MD;  [facility];  Service: Urology;  Laterality: Right;   ESOPHAGOGASTRODUODENOSCOPY (EGD) WITH PROPOFOL N/A 08/07/2020   Procedure:  ESOPHAGOGASTRODUODENOSCOPY (EGD) WITH PROPOFOL;  Surgeon: Rush Landmark Telford Nab., MD;  Location: [place];  Service: Gastroenterology;  Laterality: N/A;   EUS N/A 08/07/2020   Procedure: UPPER ENDOSCOPIC ULTRASOUND (EUS) LINEAR;  Surgeon: Irving Copas., MD;  Location: [place];  Service: Gastroenterology;  Laterality: N/A;   FINE NEEDLE ASPIRATION N/A 08/07/2020   Procedure: FINE NEEDLE ASPIRATION (FNA) LINEAR;  Surgeon: Irving Copas., MD;  Location: [place];  Service: Gastroenterology;  Laterality: N/A;   HERNIA REPAIR     INGUINAL HERNIA REPAIR     MULTIPLE TOOTH EXTRACTIONS     SHOULDER SURGERY     SPLENECTOMY, TOTAL N/A 10/26/2020   Procedure: SPLENECTOMY;  Surgeon: Stark Klein, MD;  Location: [place];  Service: General;  Laterality: N/A;    Allergies as of 09/25/2021       Reactions   Ciprofloxacin Itching   Codeine Nausea And Vomiting        Medication List        Accurate as of September 25, 2021  3:51 PM. If you have any questions, ask your nurse or doctor.          acetaminophen 500 MG tablet Commonly known as: TYLENOL Take 500 mg by mouth every 6 (six) hours as needed for moderate pain or headache.   amLODipine 5 MG tablet Commonly  known as: NORVASC Take 2 tablets (10 mg total) by mouth daily.   aspirin EC 81 MG tablet Take 81 mg by mouth at bedtime.   B-12 1000 MCG Tabs Take 1,000 mcg by mouth daily.   B-D UF III MINI PEN NEEDLES 31G X 5 MM Misc Generic drug: Insulin Pen Needle USE 3 TIMES A DAY AS DIRECTED   Basaglar KwikPen 100 UNIT/ML Inject 6 Units into the skin at bedtime.   FreeStyle Libre 14 Day Sensor Misc USE 1 SENSOR EVERY 14 (FOURTEEN) DAYS.   FREESTYLE LITE test strip Generic drug: glucose blood CHECK BLOOD SUGAR NO MORE THAN TWICE DAILY.   furosemide 20 MG tablet Commonly known as: LASIX Take 1 tablet (20 mg total) by mouth every other day.   hydrALAZINE 100 MG tablet Commonly known as:  APRESOLINE Take 100 mg by mouth 3 (three) times daily. What changed: Another medication with the same name was removed. Continue taking this medication, and follow the directions you see here. Changed by: Kathlene November, MD   insulin lispro 100 UNIT/ML KwikPen Commonly known as: HUMALOG 5 units at breakfast, 6 unit at lunch , 6 units at supper   levothyroxine 112 MCG tablet Commonly known as: SYNTHROID Take 1 tablet (112 mcg total) by mouth daily before lunch.   metoprolol succinate 50 MG 24 hr tablet Commonly known as: TOPROL-XL Take 0.5 tablets (25 mg total) by mouth daily. TAKE WITH OR IMMEDIATELY FOLLOWING A MEAL.   omeprazole 40 MG capsule Commonly known as: PRILOSEC Take 1 capsule (40 mg total) by mouth daily.   simvastatin 20 MG tablet Commonly known as: ZOCOR Take 1 tablet (20 mg total) by mouth at bedtime.   Vitamin D3 50 MCG (2000 UT) capsule Take 2,000 Units by mouth daily.   Zenpep 25000-79000 units Cpep Generic drug: Pancrelipase (Lip-Prot-Amyl) Take 2 capsules by mouth with every meal and snacks           Objective:   Physical Exam BP (!) 178/84 (BP Location: Left Arm, Patient Position: Sitting, Cuff Size: Small)   Pulse (!) 53   Temp 97.7 F (36.5 C) (Oral)   Resp 18   Ht 5\' 8"  (1.727 m)   Wt 163 lb 6 oz (74.1 kg)   SpO2 96%   BMI 24.84 kg/m  General:   Well developed, NAD, BMI noted.  HEENT:  Normocephalic . Face symmetric, atraumatic Lungs:  CTA B Normal respiratory effort, no intercostal retractions, no accessory muscle use. Heart: RRR,  no murmur.  Abdomen:  Not distended, soft, non-tender. No rebound or rigidity.   Skin: Not pale. Not jaundice Lower extremities: no pretibial edema bilaterally  Neurologic:  alert & oriented X3.  Speech normal, gait appropriate for age and unassisted Psych--  Cognition and judgment appear intact.  Cooperative with normal attention span and concentration.  Behavior appropriate. No anxious or depressed  appearing.     Assessment     Assessment DM -- w/ CAD, no neuropathy, intolerant to metformin , sees ENDO HTN Hyperlipidemia Hypothyroidism DJD BPH, LUTS,h/o urolithiasis -- sees urology CKD: started to see renal 06/2020 Anemia with normal ferritin CV: --CAD, CABG 1997, cardiac cath 2009 --PVD (no ABIs in the chart) --L leg edema, chronic, (-) DVT per [place] 2015 Gout GI: Pancreatic insufficiency dx 02-2017 B 12 deficiency, mild 2015  Wt loss:  2012 187 lb, 2014 173 lb , 2015 167 01-2017: EGD normal, BX chronic active gastritis. Colonoscopy normal. Saw GI 02-2017: likely d/t Pancreatic insufficiency and  poorly controlled diabetes. 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  Surgeries 10-2020 Asplenia Covid dx 05/2021    PLAN: Surgical clearance: Patient is cleared (BP is elevated but we are restarting amlodipine, see next) DM: Per Endo HTN: Ran out of amlodipine about 2 months ago, BP has been elevated, amlodipine RF sent, anticipate BP will get better soon. He also takes Lasix, hydralazine, metoprolol. Hyperlipidemia: On simvastatin, checking labs Hypothyroidism: On Synthroid, checking labs CKD: To see renal in 2 weeks Asplenia: Immunizations reviewed --Td 2015 - PNM 23 : 2011; PNM 13: 11-2015.  PNM 20 today -zostavax: 2015 -Meningococcal: Needed -meningococcal B: Needed haemophilus influenza: Needed Will complete immunizations in the next visits Med compliance: I went over the medication list with the patient and his son and explained them what the medications are for. RTC 3 months     This visit occurred during the SARS-CoV-2 public health emergency.  Safety protocols were in place, including screening questions prior to the visit, additional usage of staff PPE, and extensive cleaning of exam room while observing appropriate contact time as  indicated for disinfecting solutions.

## 2021-09-25 NOTE — Telephone Encounter (Signed)
Received fax confirmation

## 2021-09-25 NOTE — Patient Instructions (Addendum)
Recommend to proceed with the following vaccines at your pharmacy:  Shingrix (shingles) Covid #4  Restart amlodipine 5 mg daily Check the  blood pressure  daily BP GOAL is between 110/65 and  135/85. If it is consistently higher or lower, let me know     GO TO THE LAB : Get the blood work     Ethan Mcneil, Ethan Mcneil back for a checkup in 3 months

## 2021-09-27 MED ORDER — LEVOTHYROXINE SODIUM 137 MCG PO TABS: 137.0000 ug | ORAL_TABLET | Freq: Every day | ORAL | 0 refills | Status: DC

## 2021-09-27 NOTE — Addendum Note (Signed)
Addended by: Damita Dunnings D on: 09/27/2021 11:20 AM   Modules accepted: Orders

## 2021-10-08 DIAGNOSIS — Z881 Allergy status to other antibiotic agents status: Secondary | ICD-10-CM | POA: Diagnosis not present

## 2021-10-08 DIAGNOSIS — Z79890 Hormone replacement therapy (postmenopausal): Secondary | ICD-10-CM | POA: Diagnosis not present

## 2021-10-08 DIAGNOSIS — Z885 Allergy status to narcotic agent status: Secondary | ICD-10-CM | POA: Diagnosis not present

## 2021-10-08 DIAGNOSIS — E7800 Pure hypercholesterolemia, unspecified: Secondary | ICD-10-CM | POA: Diagnosis not present

## 2021-10-08 DIAGNOSIS — I10 Essential (primary) hypertension: Secondary | ICD-10-CM | POA: Diagnosis not present

## 2021-10-08 DIAGNOSIS — E1136 Type 2 diabetes mellitus with diabetic cataract: Secondary | ICD-10-CM | POA: Diagnosis not present

## 2021-10-08 DIAGNOSIS — Z7982 Long term (current) use of aspirin: Secondary | ICD-10-CM | POA: Diagnosis not present

## 2021-10-08 DIAGNOSIS — Z66 Do not resuscitate: Secondary | ICD-10-CM | POA: Diagnosis not present

## 2021-10-08 DIAGNOSIS — E039 Hypothyroidism, unspecified: Secondary | ICD-10-CM | POA: Diagnosis not present

## 2021-10-08 DIAGNOSIS — Z794 Long term (current) use of insulin: Secondary | ICD-10-CM | POA: Diagnosis not present

## 2021-10-08 DIAGNOSIS — I2510 Atherosclerotic heart disease of native coronary artery without angina pectoris: Secondary | ICD-10-CM | POA: Diagnosis not present

## 2021-10-08 DIAGNOSIS — H2512 Age-related nuclear cataract, left eye: Secondary | ICD-10-CM | POA: Diagnosis not present

## 2021-10-08 DIAGNOSIS — Z79899 Other long term (current) drug therapy: Secondary | ICD-10-CM | POA: Diagnosis not present

## 2021-10-10 DIAGNOSIS — N1832 Chronic kidney disease, stage 3b: Secondary | ICD-10-CM | POA: Diagnosis not present

## 2021-10-10 LAB — COMPREHENSIVE METABOLIC PANEL
Albumin: 4.1 (ref 3.5–5.0)
Calcium: 8.6 — AB (ref 8.7–10.7)
GFR calc non Af Amer: 28

## 2021-10-10 LAB — BASIC METABOLIC PANEL
BUN: 25 — AB (ref 4–21)
CO2: 16 (ref 13–22)
Chloride: 107 (ref 99–108)
Creatinine: 2.2 — AB (ref 0.6–1.3)
Glucose: 71
Potassium: 5 (ref 3.4–5.3)
Sodium: 139 (ref 137–147)

## 2021-10-15 DIAGNOSIS — N189 Chronic kidney disease, unspecified: Secondary | ICD-10-CM | POA: Diagnosis not present

## 2021-10-15 DIAGNOSIS — E875 Hyperkalemia: Secondary | ICD-10-CM | POA: Diagnosis not present

## 2021-10-15 DIAGNOSIS — E559 Vitamin D deficiency, unspecified: Secondary | ICD-10-CM | POA: Diagnosis not present

## 2021-10-15 DIAGNOSIS — I129 Hypertensive chronic kidney disease with stage 1 through stage 4 chronic kidney disease, or unspecified chronic kidney disease: Secondary | ICD-10-CM | POA: Diagnosis not present

## 2021-10-15 DIAGNOSIS — D631 Anemia in chronic kidney disease: Secondary | ICD-10-CM | POA: Diagnosis not present

## 2021-10-15 DIAGNOSIS — R609 Edema, unspecified: Secondary | ICD-10-CM | POA: Diagnosis not present

## 2021-10-15 DIAGNOSIS — E872 Acidosis, unspecified: Secondary | ICD-10-CM | POA: Diagnosis not present

## 2021-10-15 DIAGNOSIS — N184 Chronic kidney disease, stage 4 (severe): Secondary | ICD-10-CM | POA: Diagnosis not present

## 2021-10-15 LAB — IRON,TIBC AND FERRITIN PANEL
%SAT: 22
Ferritin: 64
Iron: 52
TIBC: 241
UIBC: 189

## 2021-10-15 LAB — VITAMIN D 25 HYDROXY (VIT D DEFICIENCY, FRACTURES): Vit D, 25-Hydroxy: 29.3

## 2021-10-15 LAB — CBC AND DIFFERENTIAL: Hemoglobin: 8.3 — AB (ref 13.5–17.5)

## 2021-10-29 DIAGNOSIS — E1136 Type 2 diabetes mellitus with diabetic cataract: Secondary | ICD-10-CM | POA: Diagnosis not present

## 2021-10-29 DIAGNOSIS — I2510 Atherosclerotic heart disease of native coronary artery without angina pectoris: Secondary | ICD-10-CM | POA: Diagnosis not present

## 2021-10-29 DIAGNOSIS — Z794 Long term (current) use of insulin: Secondary | ICD-10-CM | POA: Diagnosis not present

## 2021-10-29 DIAGNOSIS — Z79899 Other long term (current) drug therapy: Secondary | ICD-10-CM | POA: Diagnosis not present

## 2021-10-29 DIAGNOSIS — E1122 Type 2 diabetes mellitus with diabetic chronic kidney disease: Secondary | ICD-10-CM | POA: Diagnosis not present

## 2021-10-29 DIAGNOSIS — E039 Hypothyroidism, unspecified: Secondary | ICD-10-CM | POA: Diagnosis not present

## 2021-10-29 DIAGNOSIS — Z7982 Long term (current) use of aspirin: Secondary | ICD-10-CM | POA: Diagnosis not present

## 2021-10-29 DIAGNOSIS — Z66 Do not resuscitate: Secondary | ICD-10-CM | POA: Diagnosis not present

## 2021-10-29 DIAGNOSIS — E7800 Pure hypercholesterolemia, unspecified: Secondary | ICD-10-CM | POA: Diagnosis not present

## 2021-10-29 DIAGNOSIS — I129 Hypertensive chronic kidney disease with stage 1 through stage 4 chronic kidney disease, or unspecified chronic kidney disease: Secondary | ICD-10-CM | POA: Diagnosis not present

## 2021-10-29 DIAGNOSIS — H2511 Age-related nuclear cataract, right eye: Secondary | ICD-10-CM | POA: Diagnosis not present

## 2021-10-29 DIAGNOSIS — N189 Chronic kidney disease, unspecified: Secondary | ICD-10-CM | POA: Diagnosis not present

## 2021-10-29 DIAGNOSIS — H269 Unspecified cataract: Secondary | ICD-10-CM | POA: Diagnosis not present

## 2021-10-29 DIAGNOSIS — H5703 Miosis: Secondary | ICD-10-CM | POA: Diagnosis not present

## 2021-10-29 DIAGNOSIS — Z79890 Hormone replacement therapy (postmenopausal): Secondary | ICD-10-CM | POA: Diagnosis not present

## 2021-10-31 ENCOUNTER — Telehealth: Payer: Self-pay | Admitting: Internal Medicine

## 2021-10-31 NOTE — Telephone Encounter (Signed)
Awaiting triage note.  

## 2021-10-31 NOTE — Telephone Encounter (Signed)
Patient called stating his blood pressure was around 180/90 and was not coming down. He was offered and appointment today and tomorrow but he stated he could only come in on Friday. Appointment was made for 11/18 and patient was triaged for further advice.

## 2021-11-01 DIAGNOSIS — D631 Anemia in chronic kidney disease: Secondary | ICD-10-CM | POA: Diagnosis not present

## 2021-11-01 DIAGNOSIS — N189 Chronic kidney disease, unspecified: Secondary | ICD-10-CM | POA: Diagnosis not present

## 2021-11-02 ENCOUNTER — Encounter: Payer: Self-pay | Admitting: Internal Medicine

## 2021-11-02 ENCOUNTER — Other Ambulatory Visit: Payer: Self-pay

## 2021-11-02 ENCOUNTER — Ambulatory Visit (INDEPENDENT_AMBULATORY_CARE_PROVIDER_SITE_OTHER): Payer: Medicare Other | Admitting: Internal Medicine

## 2021-11-02 VITALS — BP 182/80 | HR 55 | Temp 98.1°F | Resp 18 | Ht 68.0 in | Wt 166.4 lb

## 2021-11-02 DIAGNOSIS — Z23 Encounter for immunization: Secondary | ICD-10-CM | POA: Diagnosis not present

## 2021-11-02 DIAGNOSIS — E039 Hypothyroidism, unspecified: Secondary | ICD-10-CM

## 2021-11-02 DIAGNOSIS — I10 Essential (primary) hypertension: Secondary | ICD-10-CM | POA: Diagnosis not present

## 2021-11-02 DIAGNOSIS — Q8901 Asplenia (congenital): Secondary | ICD-10-CM

## 2021-11-02 LAB — TSH: TSH: 9.19 u[IU]/mL — ABNORMAL HIGH (ref 0.35–5.50)

## 2021-11-02 MED ORDER — AMLODIPINE BESYLATE 10 MG PO TABS: 10.0000 mg | ORAL_TABLET | Freq: Every day | ORAL | 3 refills | Status: DC

## 2021-11-02 NOTE — Patient Instructions (Addendum)
Please be sure you take all your blood pressure medications including amlodipine 10 mg, I just sent a prescription for it.  Check your blood pressure 3-4 times a week, if is not gradually getting at goal in the next couple of weeks please let me know BP GOAL is between 110/65 and  135/85.    GO TO THE LAB : Get the blood work     [place], [place] back for   a checkup in 2 months

## 2021-11-02 NOTE — Progress Notes (Signed)
Subjective:    Patient ID: Ethan Mcneil, male    DOB: [birth year], 87 y.o.   MRN: [id number]  DOS:  11/02/2021 Type of visit - description: Follow-up  Today with talk about immunizations, hypertension, CKD and thyroid disease. In general he feels well and has no complaints  Review of Systems See above   Past Medical History:  Diagnosis Date   Anemia    Arthritis    CAD (coronary artery disease)    CABG 1997; grafts patent @ cath [facility] 11/09   DM2 (diabetes mellitus, type 2) ([place])    Gout    after tick bite   History of kidney stones    HLD (hyperlipidemia)    HTN (hypertension)    essential nos   Hypothyroid    Pancreatic lesion    Postsurgical aortocoronary bypass status    PVD (peripheral vascular disease) ([place])    S/P herniorrhaphy    Tick fever 2012   [facility] , [place]   Unspecified hyperplasia of prostate without urinary obstruction and other lower urinary tract symptoms (LUTS)    Urolithiasis 2015   hx of   Wears dentures    Wears glasses     Past Surgical History:  Procedure Laterality Date   BIOPSY  08/07/2020   Procedure: BIOPSY;  Surgeon: Irving Copas., MD;  Location: [place];  Service: Gastroenterology;;   CATARACT EXTRACTION Bilateral    CORONARY ARTERY BYPASS GRAFT     x7 vessels 1997; cath 2002 and 2009; grafts patent    [place], URETEROSCOPY AND STENT PLACEMENT Right 02/21/2014   Procedure: CYSTOSCOPY WITH RIGHT  RETROGRADE PYELOGRAM,RIGHT  URETEROSCOPY WITH STONE BASKETING EXTRACTION;  Surgeon: Irine Seal, MD;  [facility];  Service: Urology;  Laterality: Right;   ESOPHAGOGASTRODUODENOSCOPY (EGD) WITH PROPOFOL N/A 08/07/2020   Procedure: ESOPHAGOGASTRODUODENOSCOPY (EGD) WITH PROPOFOL;  Surgeon: Rush Landmark Telford Nab., MD;  Location: [place];  Service: Gastroenterology;  Laterality: N/A;   EUS N/A 08/07/2020   Procedure: UPPER ENDOSCOPIC ULTRASOUND (EUS) LINEAR;  Surgeon:  Irving Copas., MD;  Location: [place];  Service: Gastroenterology;  Laterality: N/A;   FINE NEEDLE ASPIRATION N/A 08/07/2020   Procedure: FINE NEEDLE ASPIRATION (FNA) LINEAR;  Surgeon: Irving Copas., MD;  Location: [place];  Service: Gastroenterology;  Laterality: N/A;   HERNIA REPAIR     INGUINAL HERNIA REPAIR     MULTIPLE TOOTH EXTRACTIONS     SHOULDER SURGERY     SPLENECTOMY, TOTAL N/A 10/26/2020   Procedure: SPLENECTOMY;  Surgeon: Stark Klein, MD;  Location: [place];  Service: General;  Laterality: N/A;    Allergies as of 11/02/2021       Reactions   Ciprofloxacin Itching   Codeine Nausea And Vomiting        Medication List        Accurate as of November 02, 2021 11:59 PM. If you have any questions, ask your nurse or doctor.          acetaminophen 500 MG tablet Commonly known as: TYLENOL Take 500 mg by mouth every 6 (six) hours as needed for moderate pain or headache.   amLODipine 10 MG tablet Commonly known as: NORVASC Take 1 tablet (10 mg total) by mouth daily. What changed: medication strength Changed by: Kathlene November, MD   aspirin EC 81 MG tablet Take 81 mg by mouth at bedtime.   B-12 1000 MCG Tabs Take 1,000 mcg by mouth daily.   B-D UF III MINI  PEN NEEDLES 31G X 5 MM Misc Generic drug: Insulin Pen Needle USE 3 TIMES A DAY AS DIRECTED   Basaglar KwikPen 100 UNIT/ML Inject 6 Units into the skin at bedtime.   FreeStyle Libre 14 Day Sensor Misc USE 1 SENSOR EVERY 14 (FOURTEEN) DAYS.   FREESTYLE LITE test strip Generic drug: glucose blood CHECK BLOOD SUGAR NO MORE THAN TWICE DAILY.   furosemide 20 MG tablet Commonly known as: LASIX Take 1 tablet (20 mg total) by mouth every other day.   hydrALAZINE 100 MG tablet Commonly known as: APRESOLINE Take 100 mg by mouth 3 (three) times daily.   insulin lispro 100 UNIT/ML KwikPen Commonly known as: HUMALOG 5 units at breakfast, 6 unit at lunch , 6 units at supper What  changed: additional instructions   levothyroxine 137 MCG tablet Commonly known as: Synthroid Take 1 tablet (137 mcg total) by mouth daily before breakfast.   metoprolol succinate 50 MG 24 hr tablet Commonly known as: TOPROL-XL Take 0.5 tablets (25 mg total) by mouth daily. TAKE WITH OR IMMEDIATELY FOLLOWING A MEAL.   omeprazole 40 MG capsule Commonly known as: PRILOSEC Take 1 capsule (40 mg total) by mouth daily.   simvastatin 20 MG tablet Commonly known as: ZOCOR Take 1 tablet (20 mg total) by mouth at bedtime.   sodium bicarbonate 650 MG tablet Take 650 mg by mouth 2 (two) times daily.   Vitamin D3 50 MCG (2000 UT) capsule Take 2,000 Units by mouth daily.   Zenpep 25000-79000 units Cpep Generic drug: Pancrelipase (Lip-Prot-Amyl) Take 2 capsules by mouth with every meal and snacks What changed:  how much to take how to take this when to take this additional instructions           Objective:   Physical Exam BP (!) 182/80 (BP Location: Left Arm, Patient Position: Sitting, Cuff Size: Small)   Pulse (!) 55   Temp 98.1 F (36.7 C) (Oral)   Resp 18   Ht 5\' 8"  (1.727 m)   Wt 166 lb 6 oz (75.5 kg)   SpO2 98%   BMI 25.30 kg/m  General:   Well developed, NAD, BMI noted. HEENT:  Normocephalic . Face symmetric, atraumatic Lungs:  CTA B Normal respiratory effort, no intercostal retractions, no accessory muscle use. Heart: RRR,  no murmur.  Lower extremities: Left leg edema at baseline Skin: Not pale. Not jaundice Neurologic:  alert & oriented X3.  Speech normal, gait appropriate for age and unassisted Psych--  Cognition and judgment appear intact.  Cooperative with normal attention span and concentration.  Behavior appropriate. No anxious or depressed appearing.      Assessment    Assessment DM -- w/ CAD, no neuropathy, intolerant to metformin , sees ENDO HTN Hyperlipidemia Hypothyroidism DJD BPH, LUTS,h/o urolithiasis -- sees urology CKD: started to  see renal 06/2020 Anemia  CV: --CAD, CABG 1997, cardiac cath 2009 --PVD (no ABIs in the chart) --L leg edema, chronic, (-) DVT per [place] 2015 Gout GI: Pancreatic insufficiency dx 02-2017 B 12 deficiency, mild 2015  Wt loss:  2012 187 lb, 2014 173 lb , 2015 167 01-2017: EGD normal, BX chronic active gastritis. Colonoscopy normal. Saw GI 02-2017: likely d/t Pancreatic insufficiency and poorly controlled diabetes. 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  Surgeries 10-2020 Asplenia Covid dx 05/2021    PLAN: HTN: BP has been elevated, again  he is not taking amlodipine.  Not clear to me the reason but I will send a new prescription, change from amlodipine 5 mg 2 tablets daily to amlodipine 10 mg 1 tablet a day.  Check ambulatory BPs, continue Hydralazine, metoprolol, Lasix. CKD: Last visit with renal was few days ago, yesterday got an iron infusion. Thyroid disease: Last TSH was 10.3, dose increased, recheck today. Asplenia: Immunizations reviewed --Td 2015 - PNM 23 : 2011; PNM 13: 11-2015.  PNM 20 >>> 09-2021 -zostavax: 2015 -Meningococcal: #1 today 11-02-21 -meningococcal B: Needed - haemophilus influenza: Today 11-02-21 RTC 2 months   This visit occurred during the SARS-CoV-2 public health emergency.  Safety protocols were in place, including screening questions prior to the visit, additional usage of staff PPE, and extensive cleaning of exam room while observing appropriate contact time as indicated for disinfecting solutions.

## 2021-11-03 NOTE — Assessment & Plan Note (Signed)
Assessment DM -- w/ CAD, no neuropathy, intolerant to metformin , sees ENDO HTN Hyperlipidemia Hypothyroidism DJD BPH, LUTS,h/o urolithiasis -- sees urology CKD: started to see renal 06/2020 Anemia  CV: --CAD, CABG 1997, cardiac cath 2009 --PVD (no ABIs in the chart) --L leg edema, chronic, (-) DVT per [place] 2015 Gout GI: Pancreatic insufficiency dx 02-2017 B 12 deficiency, mild 2015  Wt loss:  2012 187 lb, 2014 173 lb , 2015 167 01-2017: EGD normal, BX chronic active gastritis. Colonoscopy normal. Saw GI 02-2017: likely d/t Pancreatic insufficiency and poorly controlled diabetes. 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  Surgeries 10-2020 Asplenia Covid dx 05/2021    PLAN: HTN: BP has been elevated, again he is not taking amlodipine.  Not clear to me the reason but I will send a new prescription, change from amlodipine 5 mg 2 tablets daily to amlodipine 10 mg 1 tablet a day.  Check ambulatory BPs, continue Hydralazine, metoprolol, Lasix. CKD: Last visit with renal was few days ago, yesterday got an iron infusion. Thyroid disease: Last TSH was 10.3, dose increased, recheck today. Asplenia: Immunizations reviewed --Td 2015 - PNM 23 : 2011; PNM 13: 11-2015.  PNM 20 >>> 09-2021 -zostavax: 2015 -Meningococcal: #1 today 11-02-21 -meningococcal B: Needed - haemophilus influenza: Today 11-02-21 RTC 2 months

## 2021-11-07 DIAGNOSIS — R609 Edema, unspecified: Secondary | ICD-10-CM | POA: Diagnosis not present

## 2021-11-07 DIAGNOSIS — E872 Acidosis, unspecified: Secondary | ICD-10-CM | POA: Diagnosis not present

## 2021-11-07 DIAGNOSIS — N184 Chronic kidney disease, stage 4 (severe): Secondary | ICD-10-CM | POA: Diagnosis not present

## 2021-11-07 DIAGNOSIS — E875 Hyperkalemia: Secondary | ICD-10-CM | POA: Diagnosis not present

## 2021-11-07 DIAGNOSIS — D631 Anemia in chronic kidney disease: Secondary | ICD-10-CM | POA: Diagnosis not present

## 2021-11-07 DIAGNOSIS — I129 Hypertensive chronic kidney disease with stage 1 through stage 4 chronic kidney disease, or unspecified chronic kidney disease: Secondary | ICD-10-CM | POA: Diagnosis not present

## 2021-11-07 DIAGNOSIS — N2581 Secondary hyperparathyroidism of renal origin: Secondary | ICD-10-CM | POA: Diagnosis not present

## 2021-11-07 LAB — COMPREHENSIVE METABOLIC PANEL
Albumin: 3.9 (ref 3.5–5.0)
Calcium: 8.8 (ref 8.7–10.7)
GFR calc non Af Amer: 21

## 2021-11-07 LAB — BASIC METABOLIC PANEL
BUN: 33 — AB (ref 4–21)
CO2: 21 (ref 13–22)
Chloride: 104 (ref 99–108)
Creatinine: 2.9 — AB (ref 0.6–1.3)
Glucose: 76
Potassium: 4.4 (ref 3.4–5.3)
Sodium: 137 (ref 137–147)

## 2021-11-07 MED ORDER — LEVOTHYROXINE SODIUM 150 MCG PO TABS: 150.0000 ug | ORAL_TABLET | Freq: Every day | ORAL | 0 refills | Status: DC

## 2021-11-07 NOTE — Addendum Note (Signed)
Addended byDamita Dunnings D on: 11/07/2021 04:33 PM   Modules accepted: Orders

## 2021-11-12 ENCOUNTER — Encounter: Payer: Self-pay | Admitting: Internal Medicine

## 2021-11-12 ENCOUNTER — Other Ambulatory Visit (INDEPENDENT_AMBULATORY_CARE_PROVIDER_SITE_OTHER): Payer: Medicare Other

## 2021-11-12 ENCOUNTER — Other Ambulatory Visit: Payer: Self-pay

## 2021-11-12 DIAGNOSIS — E039 Hypothyroidism, unspecified: Secondary | ICD-10-CM

## 2021-11-12 LAB — TSH: TSH: 4.12 u[IU]/mL (ref 0.35–5.50)

## 2021-11-14 DIAGNOSIS — D631 Anemia in chronic kidney disease: Secondary | ICD-10-CM | POA: Diagnosis not present

## 2021-11-14 DIAGNOSIS — N189 Chronic kidney disease, unspecified: Secondary | ICD-10-CM | POA: Diagnosis not present

## 2021-11-30 ENCOUNTER — Other Ambulatory Visit: Payer: Self-pay | Admitting: General Surgery

## 2021-11-30 DIAGNOSIS — D490 Neoplasm of unspecified behavior of digestive system: Secondary | ICD-10-CM

## 2021-12-06 ENCOUNTER — Other Ambulatory Visit: Payer: Self-pay | Admitting: Internal Medicine

## 2021-12-06 ENCOUNTER — Other Ambulatory Visit: Payer: Self-pay | Admitting: Endocrinology

## 2021-12-12 ENCOUNTER — Telehealth: Payer: Self-pay | Admitting: Internal Medicine

## 2021-12-12 DIAGNOSIS — Z794 Long term (current) use of insulin: Secondary | ICD-10-CM | POA: Diagnosis not present

## 2021-12-12 DIAGNOSIS — I2510 Atherosclerotic heart disease of native coronary artery without angina pectoris: Secondary | ICD-10-CM | POA: Diagnosis not present

## 2021-12-12 DIAGNOSIS — I110 Hypertensive heart disease with heart failure: Secondary | ICD-10-CM | POA: Diagnosis not present

## 2021-12-12 DIAGNOSIS — I509 Heart failure, unspecified: Secondary | ICD-10-CM | POA: Diagnosis not present

## 2021-12-12 DIAGNOSIS — Z79899 Other long term (current) drug therapy: Secondary | ICD-10-CM | POA: Diagnosis not present

## 2021-12-12 DIAGNOSIS — R001 Bradycardia, unspecified: Secondary | ICD-10-CM | POA: Diagnosis not present

## 2021-12-12 DIAGNOSIS — E7800 Pure hypercholesterolemia, unspecified: Secondary | ICD-10-CM | POA: Diagnosis not present

## 2021-12-12 DIAGNOSIS — R9431 Abnormal electrocardiogram [ECG] [EKG]: Secondary | ICD-10-CM | POA: Diagnosis not present

## 2021-12-12 DIAGNOSIS — R079 Chest pain, unspecified: Secondary | ICD-10-CM | POA: Diagnosis not present

## 2021-12-12 DIAGNOSIS — R0602 Shortness of breath: Secondary | ICD-10-CM | POA: Diagnosis not present

## 2021-12-12 DIAGNOSIS — Z7982 Long term (current) use of aspirin: Secondary | ICD-10-CM | POA: Diagnosis not present

## 2021-12-12 DIAGNOSIS — E079 Disorder of thyroid, unspecified: Secondary | ICD-10-CM | POA: Diagnosis not present

## 2021-12-12 DIAGNOSIS — Z79890 Hormone replacement therapy (postmenopausal): Secondary | ICD-10-CM | POA: Diagnosis not present

## 2021-12-12 DIAGNOSIS — E119 Type 2 diabetes mellitus without complications: Secondary | ICD-10-CM | POA: Diagnosis not present

## 2021-12-12 NOTE — Telephone Encounter (Signed)
FYI. Pt triaged to ED.  

## 2021-12-12 NOTE — Telephone Encounter (Signed)
Nurse Assessment Nurse: Jearld Pies, RN, Lovena Le Date/Time Eilene Ghazi Time): 12/12/2021 8:26:02 AM Confirm and document reason for call. If symptomatic, describe symptoms. ---Caller states he has shortness of breath & swelling in extremities. Gradually getting worse this past month and worsened yesterday. Swelling in arms and legs. Denies fever, drainage, chest pain, or any other symptoms at this time. Does the patient have any new or worsening symptoms? ---Yes Will a triage be completed? ---Yes Related visit to physician within the last 2 weeks? ---No Does the PT have any chronic conditions? (i.e. diabetes, asthma, this includes High risk factors for pregnancy, etc.) ---Yes List chronic conditions. ---Diabetes Is this a behavioral health or substance abuse call? ---No Guidelines Guideline Title Affirmed Question Affirmed Notes Nurse Date/Time Eilene Ghazi Time) Leg Swelling and Edema Difficulty breathing at rest Jearld Pies, RN, Lovena Le 12/12/2021 8:28:05 AM Disp. Time Eilene Ghazi Time) Disposition Final User 12/12/2021 8:24:28 AM Send to Urgent Levin Bacon, [place] PLEASE NOTE: All timestamps contained within this report are represented as [place] Standard Time. CONFIDENTIALTY NOTICE: This fax transmission is intended only for the addressee. It contains information that is legally privileged, confidential or otherwise protected from use or disclosure. If you are not the intended recipient, you are strictly prohibited from reviewing, disclosing, copying using or disseminating any of this information or taking any action in reliance on or regarding this information. If you have received this fax in error, please notify us immediately by telephone so that we can arrange for its return to [place]. Phone: [phone], Toll-Free: [phone], Fax: [phone] Page: 2 of 2 Call Id: [id number] 12/12/2021 8:29:26 AM Go to ED Now Yes Jearld Pies, RN, Apolonio Schneiders Disagree/Comply Comply Caller Understands  Yes PreDisposition InappropriateToAsk Care Advice Given Per Guideline * Leave now. Drive carefully. * Go to the ED at ___________ Hospital. * You need to be seen in the Emergency Department. GO TO ED NOW: * Call EMS if you become worse. CALL EMS 911 IF: Referrals GO TO FACILITY OTHER - SPECIFY

## 2021-12-12 NOTE — Telephone Encounter (Signed)
Pt currently in ED.

## 2021-12-12 NOTE — Telephone Encounter (Signed)
Pt called in and stated he was trying to get an appointment. Stated he was having shortness of breathe and swelling. Transferred him to triage for further advice.

## 2021-12-13 NOTE — Telephone Encounter (Signed)
LMOM informing Pt to call back to schedule ED f/u next week at his convenience.

## 2021-12-13 NOTE — Telephone Encounter (Signed)
Needs ED follow-up at his convenience

## 2021-12-15 ENCOUNTER — Other Ambulatory Visit: Payer: Self-pay | Admitting: Endocrinology

## 2021-12-15 DIAGNOSIS — R0602 Shortness of breath: Secondary | ICD-10-CM | POA: Diagnosis not present

## 2021-12-18 ENCOUNTER — Other Ambulatory Visit: Payer: Self-pay | Admitting: Endocrinology

## 2021-12-21 ENCOUNTER — Other Ambulatory Visit: Payer: Self-pay

## 2021-12-21 ENCOUNTER — Ambulatory Visit (INDEPENDENT_AMBULATORY_CARE_PROVIDER_SITE_OTHER): Payer: Medicare HMO | Admitting: Endocrinology

## 2021-12-21 VITALS — BP 180/70 | HR 75 | Ht 68.0 in | Wt 198.2 lb

## 2021-12-21 DIAGNOSIS — E1169 Type 2 diabetes mellitus with other specified complication: Secondary | ICD-10-CM | POA: Diagnosis not present

## 2021-12-21 DIAGNOSIS — Z794 Long term (current) use of insulin: Secondary | ICD-10-CM

## 2021-12-21 DIAGNOSIS — E1151 Type 2 diabetes mellitus with diabetic peripheral angiopathy without gangrene: Secondary | ICD-10-CM | POA: Diagnosis not present

## 2021-12-21 LAB — POCT GLYCOSYLATED HEMOGLOBIN (HGB A1C): Hemoglobin A1C: 6.4 % — AB (ref 4.0–5.6)

## 2021-12-21 MED ORDER — INSULIN LISPRO (1 UNIT DIAL) 100 UNIT/ML (KWIKPEN): PEN_INJECTOR | SUBCUTANEOUS | 3 refills | Status: DC

## 2021-12-21 NOTE — Patient Instructions (Addendum)
Your blood pressure is high today.  Please see your primary care provider this week, to have it rechecked. check your blood sugar 4 times a day.  vary the time of day when you check, between before the 3 meals, and at bedtime.  also check if you have symptoms of your blood sugar being too high or too low.  please keep a record of the readings and bring it to your next appointment here (or you can bring the meter itself).  You can write it on any piece of paper.  please call us sooner if your blood sugar goes below 70, or if you have a lot of readings over 200.   Please reduce the humalog to 5 units with breakfast, 6 units with lunch, and 6 units with supper.   Please reduce the Basaglar to 6 units at bedtime.   Please come back for a follow-up appointment in 2 months.    Diabetic DME Suppliers   [email or web address] [phone] Fax [phone]  [email or web address] [phone] Fax [phone]  [email or web address] [phone] ext [phone] Fax [phone]  [facility] [phone] Fax [phone]  [email or web address] [phone] Fax [phone]  [place] [email or web address] [phone] Fax [phone]  [email or web address] [phone] Fax [phone]  [email or web address] [phone] Fax [email or web address] [phone] option #1 Fax [phone]  [email or web address] [phone] Fax [phone]  [place] [facility] [phone] Fax [phone]  [place] [email or web address] [phone] Fax [phone]

## 2021-12-21 NOTE — Progress Notes (Signed)
Subjective:    Patient ID: Ethan Mcneil, male    DOB: 01-08-1934, 88 y.o.   MRN: [id number]  HPI Pt returns for f/u of diabetes mellitus: DM type: pancreatic insuff. Dx'ed: 7673 Complications: PN, CRI, CAD, and PAD.   Therapy: insulin since 2017 DKA: never Severe hypoglycemia: once (2021) Pancreatitis: once (2017) Pancreatic imaging: chronic atrophy with diffuse pancreatic duct dilatation.   SDOH: frail elderly state Other: he takes multiple daily injections; he declines pump; he had distal pancreatectomy in 2021.   Interval history: he has not recently used continuous glucose monitor. Pt says glucose varies from 40-325.  It is in general lowest in the afternoon, and highest at no particular time.  No recent steroids.  He seldom has hypoglycemia, and these episodes are mild.  He says basaglar is 7 units qhs, and humalog is 6-6-7 units.   Past Medical History:  Diagnosis Date   Anemia    Arthritis    CAD (coronary artery disease)    CABG 1997; grafts patent @ cath [facility] 11/09   DM2 (diabetes mellitus, type 2) ([place])    Gout    after tick bite   History of kidney stones    HLD (hyperlipidemia)    HTN (hypertension)    essential nos   Hypothyroid    Pancreatic lesion    Postsurgical aortocoronary bypass status    PVD (peripheral vascular disease) ([place])    S/P herniorrhaphy    Tick fever 2012   [facility] , [place]   Unspecified hyperplasia of prostate without urinary obstruction and other lower urinary tract symptoms (LUTS)    Urolithiasis 2015   hx of   Wears dentures    Wears glasses     Past Surgical History:  Procedure Laterality Date   BIOPSY  08/07/2020   Procedure: BIOPSY;  Surgeon: Irving Copas., MD;  Location: [place];  Service: Gastroenterology;;   CATARACT EXTRACTION Bilateral    CORONARY ARTERY BYPASS GRAFT     x7 vessels 1997; cath 2002 and 2009; grafts patent    CYSTOSCOPY WITH RETROGRADE PYELOGRAM, URETEROSCOPY AND STENT  PLACEMENT Right 02/21/2014   Procedure: CYSTOSCOPY WITH RIGHT  RETROGRADE PYELOGRAM,RIGHT  URETEROSCOPY WITH STONE BASKETING EXTRACTION;  Surgeon: Irine Seal, MD;  Location: [facility];  Service: Urology;  Laterality: Right;   ESOPHAGOGASTRODUODENOSCOPY (EGD) WITH PROPOFOL N/A 08/07/2020   Procedure: ESOPHAGOGASTRODUODENOSCOPY (EGD) WITH PROPOFOL;  Surgeon: Rush Landmark Telford Nab., MD;  Location: [place];  Service: Gastroenterology;  Laterality: N/A;   EUS N/A 08/07/2020   Procedure: UPPER ENDOSCOPIC ULTRASOUND (EUS) LINEAR;  Surgeon: Irving Copas., MD;  Location: [place];  Service: Gastroenterology;  Laterality: N/A;   FINE NEEDLE ASPIRATION N/A 08/07/2020   Procedure: FINE NEEDLE ASPIRATION (FNA) LINEAR;  Surgeon: Irving Copas., MD;  Location: [place];  Service: Gastroenterology;  Laterality: N/A;   HERNIA REPAIR     INGUINAL HERNIA REPAIR     MULTIPLE TOOTH EXTRACTIONS     SHOULDER SURGERY     SPLENECTOMY, TOTAL N/A 10/26/2020   Procedure: SPLENECTOMY;  Surgeon: Stark Klein, MD;  [facility];  Service: General;  Laterality: N/A;    Social History   Socioeconomic History   Marital status: Married    Spouse name: Pat   Number of children: 4   Years of education: Not on file   Highest education level: Not on file  Occupational History   Occupation: retired, works few hours   Tobacco Use   Smoking status: Never  Smokeless tobacco: Never  Vaping Use   Vaping Use: Never used  Substance and Sexual Activity   Alcohol use: No   Drug use: No   Sexual activity: Not Currently  Other Topics Concern   Not on file  Social History Narrative   Dealer; aviator and former race car driver   4 children (boys)   Radio producer form signed appointing wife - [name]; ok to leave msg on home phone [phone]         Social Determinants of Health   Financial Resource Strain: Low Risk    Difficulty of Paying Living Expenses: Not hard at all  Food  Insecurity: No Food Insecurity   Worried About Charity fundraiser in the Last Year: Never true   Arboriculturist in the Last Year: Never true  Transportation Needs: No Transportation Needs   Lack of Transportation (Medical): No   Lack of Transportation (Non-Medical): No  Physical Activity: Insufficiently Active   Days of Exercise per Week: 5 days   Minutes of Exercise per Session: 20 min  Stress: No Stress Concern Present   Feeling of Stress : Only a little  Social Connections: Moderately Integrated   Frequency of Communication with Friends and Family: More than three times a week   Frequency of Social Gatherings with Friends and Family: More than three times a week   Attends Religious Services: More than 4 times per year   Active Member of [employer] or Organizations: No   Attends Archivist Meetings: Never   Marital Status: Married  Human resources officer Violence: Not At Risk   Fear of Current or Ex-Partner: No   Emotionally Abused: No   Physically Abused: No   Sexually Abused: No    Current Outpatient Medications on File Prior to Visit  Medication Sig Dispense Refill   acetaminophen (TYLENOL) 500 MG tablet Take 500 mg by mouth every 6 (six) hours as needed for moderate pain or headache.     amLODipine (NORVASC) 10 MG tablet Take 1 tablet (10 mg total) by mouth daily. 90 tablet 3   aspirin EC 81 MG tablet Take 81 mg by mouth at bedtime.      B-D UF III MINI PEN NEEDLES 31G X 5 MM MISC USE 3 TIMES A DAY AS DIRECTED 100 each 2   Cholecalciferol (VITAMIN D3) 50 MCG (2000 UT) capsule Take 2,000 Units by mouth daily.     Continuous Blood Gluc Sensor (FREESTYLE LIBRE 14 DAY SENSOR) MISC USE 1 SENSOR EVERY 14 (FOURTEEN) DAYS. 6 each 3   Cyanocobalamin (B-12) 1000 MCG TABS Take 1,000 mcg by mouth daily.     FREESTYLE LITE test strip CHECK BLOOD SUGAR NO MORE THAN TWICE DAILY. 200 strip 0   furosemide (LASIX) 20 MG tablet Take 1 tablet (20 mg total) by mouth every other day. 45 tablet 1    hydrALAZINE (APRESOLINE) 100 MG tablet Take 100 mg by mouth 3 (three) times daily.     Insulin Glargine (BASAGLAR KWIKPEN) 100 UNIT/ML Inject 6 Units into the skin daily. 15 mL 0   levothyroxine (SYNTHROID) 150 MCG tablet Take 1 tablet (150 mcg total) by mouth daily before breakfast. 90 tablet 0   metoprolol succinate (TOPROL-XL) 50 MG 24 hr tablet Take 0.5 tablets (25 mg total) by mouth daily. TAKE WITH OR IMMEDIATELY FOLLOWING A MEAL. 30 tablet 1   omeprazole (PRILOSEC) 40 MG capsule Take 1 capsule (40 mg total) by mouth daily. 90 capsule 2  Pancrelipase, Lip-Prot-Amyl, (ZENPEP) 25000-79000 units CPEP Take 2 capsules by mouth with every meal and snacks (Patient taking differently: Take 1 capsule by mouth daily at 12 noon.) 540 capsule 1   simvastatin (ZOCOR) 20 MG tablet TAKE 1 TABLET BY MOUTH EVERYDAY AT BEDTIME 90 tablet 1   sodium bicarbonate 650 MG tablet Take 650 mg by mouth 2 (two) times daily.     No current facility-administered medications on file prior to visit.    Allergies  Allergen Reactions   Ciprofloxacin Itching   Codeine Nausea And Vomiting    Family History  Problem Relation Age of Onset   Lung cancer Father    Coronary artery disease Mother        stent   Diabetes Brother    Prostate cancer Maternal Uncle        in his 52s   Stroke Son        GF   Throat cancer Son    Colon cancer Neg Hx    Rectal cancer Neg Hx    Stomach cancer Neg Hx    Esophageal cancer Neg Hx    Pancreatic cancer Neg Hx     BP (!) 180/70    Pulse 75    Ht 5\' 8"  (1.727 m)    Wt 198 lb 3.2 oz (89.9 kg)    SpO2 90%    BMI 30.14 kg/m    Review of Systems     Objective:   Physical Exam  Lab Results  Component Value Date   CREATININE 2.9 (A) 11/07/2021   BUN 33 (A) 11/07/2021   NA 137 11/07/2021   K 4.4 11/07/2021   CL 104 11/07/2021   CO2 21 11/07/2021     A1c=6.4%    Assessment & Plan:  DM, overcontrolled, due to taking more than the rx'ed insulin dosage.    Patient Instructions  Your blood pressure is high today.  Please see your primary care provider this week, to have it rechecked. check your blood sugar 4 times a day.  vary the time of day when you check, between before the 3 meals, and at bedtime.  also check if you have symptoms of your blood sugar being too high or too low.  please keep a record of the readings and bring it to your next appointment here (or you can bring the meter itself).  You can write it on any piece of paper.  please call us sooner if your blood sugar goes below 70, or if you have a lot of readings over 200.   Please reduce the humalog to 5 units with breakfast, 6 units with lunch, and 6 units with supper.   Please reduce the Basaglar to 6 units at bedtime.   Please come back for a follow-up appointment in 2 months.    Diabetic DME Suppliers   [email or web address] [phone] Fax [phone]  [facility] [email or web address] [phone] Fax [phone]  [email or web address] [phone] ext [phone] Fax [phone]  [email or web address] [phone] Fax [phone]  [facility] [email or web address] [phone] Fax [phone]  [email or web address] [phone] Fax [phone]  [facility] [email or web address] [phone] Fax [phone]  [facility] [email or web address] [phone] Fax [email or web address] [phone] option #1 Fax [phone]  [facility] [email or web address] [phone] Fax [phone]  [place] [facility] [phone] Fax [phone]  [place] [facility] [email or web address] [phone] Fax [phone]

## 2021-12-22 DIAGNOSIS — E1159 Type 2 diabetes mellitus with other circulatory complications: Secondary | ICD-10-CM | POA: Diagnosis not present

## 2021-12-22 DIAGNOSIS — E1165 Type 2 diabetes mellitus with hyperglycemia: Secondary | ICD-10-CM | POA: Insufficient documentation

## 2021-12-22 DIAGNOSIS — Z6827 Body mass index (BMI) 27.0-27.9, adult: Secondary | ICD-10-CM | POA: Diagnosis not present

## 2021-12-22 DIAGNOSIS — I110 Hypertensive heart disease with heart failure: Secondary | ICD-10-CM | POA: Diagnosis not present

## 2021-12-22 DIAGNOSIS — E039 Hypothyroidism, unspecified: Secondary | ICD-10-CM | POA: Diagnosis not present

## 2021-12-22 DIAGNOSIS — E119 Type 2 diabetes mellitus without complications: Secondary | ICD-10-CM | POA: Insufficient documentation

## 2021-12-22 DIAGNOSIS — I2510 Atherosclerotic heart disease of native coronary artery without angina pectoris: Secondary | ICD-10-CM | POA: Diagnosis not present

## 2021-12-22 DIAGNOSIS — N5089 Other specified disorders of the male genital organs: Secondary | ICD-10-CM | POA: Diagnosis not present

## 2021-12-22 DIAGNOSIS — Z794 Long term (current) use of insulin: Secondary | ICD-10-CM | POA: Diagnosis not present

## 2021-12-22 DIAGNOSIS — I509 Heart failure, unspecified: Secondary | ICD-10-CM | POA: Diagnosis not present

## 2021-12-22 DIAGNOSIS — N19 Unspecified kidney failure: Secondary | ICD-10-CM | POA: Diagnosis not present

## 2021-12-22 DIAGNOSIS — M7989 Other specified soft tissue disorders: Secondary | ICD-10-CM | POA: Diagnosis not present

## 2021-12-22 DIAGNOSIS — R0602 Shortness of breath: Secondary | ICD-10-CM | POA: Diagnosis not present

## 2021-12-22 DIAGNOSIS — I152 Hypertension secondary to endocrine disorders: Secondary | ICD-10-CM | POA: Diagnosis not present

## 2021-12-23 DIAGNOSIS — I509 Heart failure, unspecified: Secondary | ICD-10-CM | POA: Diagnosis not present

## 2021-12-25 ENCOUNTER — Other Ambulatory Visit: Payer: Self-pay | Admitting: Endocrinology

## 2021-12-25 DIAGNOSIS — N184 Chronic kidney disease, stage 4 (severe): Secondary | ICD-10-CM | POA: Diagnosis not present

## 2021-12-25 DIAGNOSIS — D631 Anemia in chronic kidney disease: Secondary | ICD-10-CM | POA: Diagnosis not present

## 2021-12-25 MED ORDER — NOVOLOG FLEXPEN 100 UNIT/ML ~~LOC~~ SOPN: PEN_INJECTOR | SUBCUTANEOUS | 11 refills | Status: DC

## 2021-12-27 ENCOUNTER — Ambulatory Visit (HOSPITAL_BASED_OUTPATIENT_CLINIC_OR_DEPARTMENT_OTHER)
Admission: RE | Admit: 2021-12-27 | Discharge: 2021-12-27 | Disposition: A | Discharge status: Home / Self Care | Payer: Medicare HMO | Source: Ambulatory Visit | Attending: Internal Medicine | Admitting: Internal Medicine

## 2021-12-27 ENCOUNTER — Other Ambulatory Visit: Payer: Self-pay | Admitting: Family Medicine

## 2021-12-27 ENCOUNTER — Ambulatory Visit (INDEPENDENT_AMBULATORY_CARE_PROVIDER_SITE_OTHER): Payer: Medicare HMO | Admitting: Internal Medicine

## 2021-12-27 ENCOUNTER — Other Ambulatory Visit: Payer: Self-pay

## 2021-12-27 ENCOUNTER — Telehealth: Payer: Self-pay

## 2021-12-27 ENCOUNTER — Encounter: Payer: Self-pay | Admitting: Internal Medicine

## 2021-12-27 VITALS — BP 164/70 | HR 75 | Temp 97.9°F | Resp 18 | Ht 68.0 in | Wt 193.0 lb

## 2021-12-27 DIAGNOSIS — I2510 Atherosclerotic heart disease of native coronary artery without angina pectoris: Secondary | ICD-10-CM | POA: Diagnosis not present

## 2021-12-27 DIAGNOSIS — J439 Emphysema, unspecified: Secondary | ICD-10-CM | POA: Diagnosis not present

## 2021-12-27 DIAGNOSIS — I10 Essential (primary) hypertension: Secondary | ICD-10-CM

## 2021-12-27 DIAGNOSIS — I2583 Coronary atherosclerosis due to lipid rich plaque: Secondary | ICD-10-CM

## 2021-12-27 DIAGNOSIS — E039 Hypothyroidism, unspecified: Secondary | ICD-10-CM | POA: Diagnosis not present

## 2021-12-27 DIAGNOSIS — R918 Other nonspecific abnormal finding of lung field: Secondary | ICD-10-CM | POA: Diagnosis not present

## 2021-12-27 DIAGNOSIS — I509 Heart failure, unspecified: Secondary | ICD-10-CM | POA: Insufficient documentation

## 2021-12-27 DIAGNOSIS — N189 Chronic kidney disease, unspecified: Secondary | ICD-10-CM

## 2021-12-27 DIAGNOSIS — J90 Pleural effusion, not elsewhere classified: Secondary | ICD-10-CM | POA: Diagnosis not present

## 2021-12-27 LAB — BASIC METABOLIC PANEL
BUN: 28 mg/dL — ABNORMAL HIGH (ref 6–23)
CO2: 23 mEq/L (ref 19–32)
Calcium: 8.3 mg/dL — ABNORMAL LOW (ref 8.4–10.5)
Chloride: 105 mEq/L (ref 96–112)
Creatinine, Ser: 2.79 mg/dL — ABNORMAL HIGH (ref 0.40–1.50)
GFR: 20.05 mL/min — ABNORMAL LOW (ref >60.00)
Glucose, Bld: 50 mg/dL — ABNORMAL LOW (ref 70–99)
Potassium: 4.3 mEq/L (ref 3.5–5.1)
Sodium: 139 mEq/L (ref 135–145)

## 2021-12-27 LAB — TSH: TSH: 2.2 u[IU]/mL (ref 0.35–5.50)

## 2021-12-27 MED ORDER — METOPROLOL SUCCINATE ER 50 MG PO TB24: 50.0000 mg | ORAL_TABLET | Freq: Every day | ORAL | 1 refills | Status: DC

## 2021-12-27 MED ORDER — FUROSEMIDE 40 MG PO TABS: 40.0000 mg | ORAL_TABLET | Freq: Every day | ORAL | 3 refills | Status: DC

## 2021-12-27 NOTE — Patient Instructions (Addendum)
Check the  blood pressure regularly BP GOAL is between 110/65 and  135/85. If it is consistently higher or lower, let me know  Continue Lasix 40 mg every day. Stop amlodipine Increase metoprolol XL 50 to 1 tablet daily Continue hydralazine 3 times a day.  ER if: Severe shortness of breath, increased swelling, chest pain.  GO TO THE LAB : Get the blood work     [place], [place] Come back for a checkup in 4 weeks   STOP BY THE FIRST FLOOR:  get the XR

## 2021-12-27 NOTE — Assessment & Plan Note (Signed)
CAD: History of, no recent cardiology evaluation. CHF, Edema, weight gain: He has chronic left leg edema but in the last month has noted increased bilateral lower extremity edema up to the waist.  + DOE, no chest pain.   On exam + JVD and hepatojugular reflux.  Has edema up to the pelvis, see physical exam.. ER eval x2 show increased BNP,   abnormal chest x-ray with reticulonodular opacities. Felt better temporarily after IV Lasix provided at the ER Previously, he was taking Lasix 20 mg every other day, now is taking 40 mg daily without apparent help. Of note, he takes  amlodipine 10 mg. Labs 12/22/2021: Creatinine 2.5, potassium 5.2, proBNP 9400, hemoglobin 10.9.  Normal albumin Plan: Urgent referral to cardiology and nephrology. BMP, chest x-ray today Discontinue amlodipine, continue Lasix 40 mg daily, increase metoprolol XL from 25 mg to 50 mg.  Monitor BPs. Hypothyroidism: Check TSH Abnormal chest x-ray: Recheck today, consider further eval. History of pancreatic intraductal papillary mucinous neoplasm: Consider abd CT at some point to investigate his lower extremity edema further.    RTC 4 weeks

## 2021-12-27 NOTE — Progress Notes (Signed)
Subjective:    Patient ID: Ethan Mcneil, male    DOB: [birth year], 88 y.o.   MRN: [id number]  DOS:  12/27/2021 Type of visit - description: ER follow-up   Went to the ER 12/12/2021: Had SOB, leg swelling for 2 weeks. Bilateral diffuse reticulonodular opacities.  Rec CT chest Small L pleural effusion Creatinine 2.7.  LFTs okay.  proBNP elevated at ~ 8000.  Troponin negative Hemoglobin 10.3 EKG: sinus brady pee report Patient reports he got IV Lasix   Went to the ER 12/22/2021: Presented with increasing lower extremity edema and some swelling of the scrotum. proBNP 9400. Hemoglobin 10.9, potassium 5.2, creatinine 2.5 Dx acute on chronic CHF. Receive Lasix 40 mg IV.  Here for follow-up of above ER visits. After the IV Lasix he did feel better temporarily. He does not weigh himself at home but he thinks the swelling is not any better, edema  is from the waist down. No shortness of breath at rest but continue with DOE No chest pain No palpitations No orthopnea No nausea vomiting or blood in the stools. No cough     Review of Systems See above   Past Medical History:  Diagnosis Date   Anemia    Arthritis    CAD (coronary artery disease)    CABG 1997; grafts patent @ cath [facility] 11/09   DM2 (diabetes mellitus, type 2) ([place])    Gout    after tick bite   History of kidney stones    HLD (hyperlipidemia)    HTN (hypertension)    essential nos   Hypothyroid    Pancreatic lesion    Postsurgical aortocoronary bypass status    PVD (peripheral vascular disease) ([place])    S/P herniorrhaphy    Tick fever 2012   [facility] , [place]   Unspecified hyperplasia of prostate without urinary obstruction and other lower urinary tract symptoms (LUTS)    Urolithiasis 2015   hx of   Wears dentures    Wears glasses     Past Surgical History:  Procedure Laterality Date   BIOPSY  08/07/2020   Procedure: BIOPSY;  Surgeon: Irving Copas., MD;  Location: [place];  Service: Gastroenterology;;   CATARACT EXTRACTION Bilateral    CORONARY ARTERY BYPASS GRAFT     x7 vessels 1997; cath 2002 and 2009; grafts patent    CYSTOSCOPY WITH RETROGRADE PYELOGRAM, URETEROSCOPY AND STENT PLACEMENT Right 02/21/2014   Procedure: CYSTOSCOPY WITH RIGHT  RETROGRADE PYELOGRAM,RIGHT  URETEROSCOPY WITH STONE BASKETING EXTRACTION;  Surgeon: Irine Seal, MD;  [facility];  Service: Urology;  Laterality: Right;   ESOPHAGOGASTRODUODENOSCOPY (EGD) WITH PROPOFOL N/A 08/07/2020   Procedure: ESOPHAGOGASTRODUODENOSCOPY (EGD) WITH PROPOFOL;  Surgeon: Rush Landmark Telford Nab., MD;  Location: [place];  Service: Gastroenterology;  Laterality: N/A;   EUS N/A 08/07/2020   Procedure: UPPER ENDOSCOPIC ULTRASOUND (EUS) LINEAR;  Surgeon: Irving Copas., MD;  Location: [place];  Service: Gastroenterology;  Laterality: N/A;   FINE NEEDLE ASPIRATION N/A 08/07/2020   Procedure: FINE NEEDLE ASPIRATION (FNA) LINEAR;  Surgeon: Irving Copas., MD;  Location: [place];  Service: Gastroenterology;  Laterality: N/A;   HERNIA REPAIR     INGUINAL HERNIA REPAIR     MULTIPLE TOOTH EXTRACTIONS     SHOULDER SURGERY     SPLENECTOMY, TOTAL N/A 10/26/2020   Procedure: SPLENECTOMY;  Surgeon: Stark Klein, MD;  Location: [place];  Service: General;  Laterality: N/A;    Current Outpatient Medications  Medication Instructions  acetaminophen (TYLENOL) 500 mg, Oral, Every 6 hours PRN   aspirin EC 81 mg, Oral, Daily at bedtime   B-12 1,000 mcg, Oral, Daily   B-D UF III MINI PEN NEEDLES 31G X 5 MM MISC USE 3 TIMES A DAY AS DIRECTED   Basaglar KwikPen 6 Units, Subcutaneous, Daily   Continuous Blood Gluc Sensor (FREESTYLE LIBRE 14 DAY SENSOR) MISC USE 1 SENSOR EVERY 14 (FOURTEEN) DAYS.   FREESTYLE LITE test strip CHECK BLOOD SUGAR NO MORE THAN TWICE DAILY.   furosemide (LASIX) 40 mg, Oral, Daily   hydrALAZINE (APRESOLINE) 100 mg, Oral, 3 times daily   insulin aspart  (NOVOLOG FLEXPEN) 100 UNIT/ML FlexPen 3 times a day (just before each meal), 5-6-6 units.   levothyroxine (SYNTHROID) 150 mcg, Oral, Daily before breakfast   metoprolol succinate (TOPROL-XL) 50 mg, Oral, Daily, TAKE WITH OR IMMEDIATELY FOLLOWING A MEAL.   omeprazole (PRILOSEC) 40 mg, Oral, Daily   Pancrelipase, Lip-Prot-Amyl, (ZENPEP) 25000-79000 units CPEP Take 2 capsules by mouth with every meal and snacks   simvastatin (ZOCOR) 20 MG tablet TAKE 1 TABLET BY MOUTH EVERYDAY AT BEDTIME   sodium bicarbonate 650 mg, Oral, 2 times daily   Vitamin D3 2,000 Units, Oral, Daily       Objective:   Physical Exam BP (!) 164/70 (BP Location: Left Arm, Patient Position: Sitting, Cuff Size: Small)    Pulse 75    Temp 97.9 F (36.6 C) (Oral)    Resp 18    Ht 5\' 8"  (1.727 m)    Wt 193 lb (87.5 kg)    SpO2 95%    BMI 29.35 kg/m  General:   Well developed, NAD, BMI noted.  HEENT:  Normocephalic . Face symmetric, atraumatic Neck: JVD elevated at 45 degrees, + hepatojugular reflux.   Lungs:  CTA B Normal respiratory effort, no intercostal retractions, no accessory muscle use. Heart: RRR,  no murmur.  Abdomen:  Not distended, soft, non-tender. No rebound or rigidity.  No abdominal wall edema Skin: Not pale. Not jaundice Lower extremities: Severe pitting edema below the knees, at least +/+++ pitting edema at the tights, worse on the L. GU: + Soft edema of the scrotum and penis (patient is able to urinate) Neurologic:  alert & oriented X3.  Speech normal, gait appropriate for age and unassisted Psych--  Cognition and judgment appear intact.  Cooperative with normal attention span and concentration.  Behavior appropriate. No anxious or depressed appearing.     Assessment     Assessment DM -- w/ CAD, no neuropathy, intolerant to metformin , sees ENDO HTN Hyperlipidemia Hypothyroidism DJD BPH, LUTS,h/o urolithiasis -- sees urology CKD: started to see renal 06/2020 Anemia  CV: --CAD, CABG  1997, cardiac cath 2009 --PVD (no ABIs in the chart) --L leg edema, chronic, (-) DVT per [place] 2015 Gout GI: Pancreatic insufficiency dx 02-2017 B 12 deficiency, mild 2015  Wt loss:  2012 187 lb, 2014 173 lb , 2015 167 01-2017: EGD normal, BX chronic active gastritis. Colonoscopy normal. Saw GI 02-2017: likely d/t Pancreatic insufficiency and poorly controlled diabetes. 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  Surgeries 10-2020 Asplenia Covid dx 05/2021    PLAN: CAD: History of, no recent cardiology evaluation. CHF, Edema, weight gain: He has chronic left leg edema but in the last month has noted increased bilateral lower extremity edema up to the waist.  +  DOE, no chest pain.   On exam + JVD and hepatojugular reflux.  Has edema up to the pelvis, see physical exam.. ER eval x2 show increased BNP,   abnormal chest x-ray with reticulonodular opacities. Felt better temporarily after IV Lasix provided at the ER Previously, he was taking Lasix 20 mg every other day, now is taking 40 mg daily without apparent help. Of note, he takes  amlodipine 10 mg. Labs 12/22/2021: Creatinine 2.5, potassium 5.2, proBNP 9400, hemoglobin 10.9.  Normal albumin Plan: Urgent referral to cardiology and nephrology. BMP, chest x-ray today Discontinue amlodipine, continue Lasix 40 mg daily, increase metoprolol XL from 25 mg to 50 mg.  Monitor BPs. Hypothyroidism: Check TSH Abnormal chest x-ray: Recheck today, consider further eval. History of pancreatic intraductal papillary mucinous neoplasm: Consider abd CT at some point to investigate his lower extremity edema further.    RTC 4 weeks    Time spent reviewing the chart and coordinating patient's care: 60 min  This visit occurred during the SARS-CoV-2 public health emergency.  Safety protocols were in place, including screening  questions prior to the visit, additional usage of staff PPE, and extensive cleaning of exam room while observing appropriate contact time as indicated for disinfecting solutions.

## 2021-12-27 NOTE — Telephone Encounter (Signed)
Left message on patient's home and mobile phone to call back. Dr. Larose Kells contacted our office to get the patient seen urgently due to being in heart failure. Will schedule patient when he returns call.

## 2021-12-28 ENCOUNTER — Encounter: Payer: Self-pay | Admitting: Cardiology

## 2021-12-28 ENCOUNTER — Other Ambulatory Visit: Payer: Self-pay

## 2021-12-28 ENCOUNTER — Inpatient Hospital Stay (HOSPITAL_COMMUNITY)
Admission: AD | Admit: 2021-12-28 | Discharge: 2022-01-01 | DRG: 291 | Disposition: A | Discharge status: Home / Self Care | Payer: Medicare HMO | Source: Ambulatory Visit | Attending: Cardiology | Admitting: Cardiology

## 2021-12-28 ENCOUNTER — Encounter (HOSPITAL_COMMUNITY): Payer: Self-pay | Admitting: Cardiology

## 2021-12-28 ENCOUNTER — Ambulatory Visit: Payer: Medicare HMO | Admitting: Cardiology

## 2021-12-28 VITALS — BP 150/60 | HR 57 | Ht 68.0 in | Wt 194.0 lb

## 2021-12-28 DIAGNOSIS — I5021 Acute systolic (congestive) heart failure: Secondary | ICD-10-CM | POA: Diagnosis not present

## 2021-12-28 DIAGNOSIS — Z8042 Family history of malignant neoplasm of prostate: Secondary | ICD-10-CM

## 2021-12-28 DIAGNOSIS — Z79899 Other long term (current) drug therapy: Secondary | ICD-10-CM

## 2021-12-28 DIAGNOSIS — I371 Nonrheumatic pulmonary valve insufficiency: Secondary | ICD-10-CM | POA: Diagnosis present

## 2021-12-28 DIAGNOSIS — Z794 Long term (current) use of insulin: Secondary | ICD-10-CM | POA: Diagnosis not present

## 2021-12-28 DIAGNOSIS — Z823 Family history of stroke: Secondary | ICD-10-CM

## 2021-12-28 DIAGNOSIS — Z801 Family history of malignant neoplasm of trachea, bronchus and lung: Secondary | ICD-10-CM | POA: Diagnosis not present

## 2021-12-28 DIAGNOSIS — N184 Chronic kidney disease, stage 4 (severe): Secondary | ICD-10-CM | POA: Diagnosis not present

## 2021-12-28 DIAGNOSIS — Z8249 Family history of ischemic heart disease and other diseases of the circulatory system: Secondary | ICD-10-CM | POA: Diagnosis not present

## 2021-12-28 DIAGNOSIS — Z951 Presence of aortocoronary bypass graft: Secondary | ICD-10-CM

## 2021-12-28 DIAGNOSIS — Z9081 Acquired absence of spleen: Secondary | ICD-10-CM

## 2021-12-28 DIAGNOSIS — I5041 Acute combined systolic (congestive) and diastolic (congestive) heart failure: Secondary | ICD-10-CM | POA: Diagnosis not present

## 2021-12-28 DIAGNOSIS — N400 Enlarged prostate without lower urinary tract symptoms: Secondary | ICD-10-CM | POA: Diagnosis present

## 2021-12-28 DIAGNOSIS — I739 Peripheral vascular disease, unspecified: Secondary | ICD-10-CM

## 2021-12-28 DIAGNOSIS — E1122 Type 2 diabetes mellitus with diabetic chronic kidney disease: Secondary | ICD-10-CM | POA: Diagnosis not present

## 2021-12-28 DIAGNOSIS — I5043 Acute on chronic combined systolic (congestive) and diastolic (congestive) heart failure: Secondary | ICD-10-CM | POA: Diagnosis not present

## 2021-12-28 DIAGNOSIS — Z20822 Contact with and (suspected) exposure to covid-19: Secondary | ICD-10-CM | POA: Diagnosis not present

## 2021-12-28 DIAGNOSIS — I2583 Coronary atherosclerosis due to lipid rich plaque: Secondary | ICD-10-CM

## 2021-12-28 DIAGNOSIS — M1990 Unspecified osteoarthritis, unspecified site: Secondary | ICD-10-CM | POA: Diagnosis present

## 2021-12-28 DIAGNOSIS — Z885 Allergy status to narcotic agent status: Secondary | ICD-10-CM | POA: Diagnosis not present

## 2021-12-28 DIAGNOSIS — Z833 Family history of diabetes mellitus: Secondary | ICD-10-CM | POA: Diagnosis not present

## 2021-12-28 DIAGNOSIS — M109 Gout, unspecified: Secondary | ICD-10-CM | POA: Diagnosis present

## 2021-12-28 DIAGNOSIS — N5089 Other specified disorders of the male genital organs: Secondary | ICD-10-CM | POA: Diagnosis present

## 2021-12-28 DIAGNOSIS — Z7982 Long term (current) use of aspirin: Secondary | ICD-10-CM

## 2021-12-28 DIAGNOSIS — E1151 Type 2 diabetes mellitus with diabetic peripheral angiopathy without gangrene: Secondary | ICD-10-CM

## 2021-12-28 DIAGNOSIS — E785 Hyperlipidemia, unspecified: Secondary | ICD-10-CM | POA: Diagnosis present

## 2021-12-28 DIAGNOSIS — I2510 Atherosclerotic heart disease of native coronary artery without angina pectoris: Secondary | ICD-10-CM

## 2021-12-28 DIAGNOSIS — I10 Essential (primary) hypertension: Secondary | ICD-10-CM

## 2021-12-28 DIAGNOSIS — Z955 Presence of coronary angioplasty implant and graft: Secondary | ICD-10-CM

## 2021-12-28 DIAGNOSIS — Z881 Allergy status to other antibiotic agents status: Secondary | ICD-10-CM

## 2021-12-28 DIAGNOSIS — I130 Hypertensive heart and chronic kidney disease with heart failure and stage 1 through stage 4 chronic kidney disease, or unspecified chronic kidney disease: Principal | ICD-10-CM | POA: Diagnosis present

## 2021-12-28 DIAGNOSIS — D136 Benign neoplasm of pancreas: Secondary | ICD-10-CM | POA: Diagnosis present

## 2021-12-28 DIAGNOSIS — E039 Hypothyroidism, unspecified: Secondary | ICD-10-CM | POA: Diagnosis not present

## 2021-12-28 DIAGNOSIS — I5033 Acute on chronic diastolic (congestive) heart failure: Secondary | ICD-10-CM | POA: Diagnosis present

## 2021-12-28 LAB — CBC WITH DIFFERENTIAL/PLATELET
Abs Immature Granulocytes: 0 10*3/uL (ref 0.00–0.07)
Basophils Absolute: 0.3 10*3/uL — ABNORMAL HIGH (ref 0.0–0.1)
Basophils Relative: 4 %
Eosinophils Absolute: 1 10*3/uL — ABNORMAL HIGH (ref 0.0–0.5)
Eosinophils Relative: 12 %
HCT: 32.1 % — ABNORMAL LOW (ref 39.0–52.0)
Hemoglobin: 10.4 g/dL — ABNORMAL LOW (ref 13.0–17.0)
Lymphocytes Relative: 18 %
Lymphs Abs: 1.4 10*3/uL (ref 0.7–4.0)
MCH: 30.3 pg (ref 26.0–34.0)
MCHC: 32.4 g/dL (ref 30.0–36.0)
MCV: 93.6 fL (ref 80.0–100.0)
Monocytes Absolute: 0.7 10*3/uL (ref 0.1–1.0)
Monocytes Relative: 9 %
Neutro Abs: 4.6 10*3/uL (ref 1.7–7.7)
Neutrophils Relative %: 57 %
Platelets: 178 10*3/uL (ref 150–400)
RBC: 3.43 MIL/uL — ABNORMAL LOW (ref 4.22–5.81)
RDW: 16.2 % — ABNORMAL HIGH (ref 11.5–15.5)
WBC: 8 10*3/uL (ref 4.0–10.5)
nRBC: 0 % (ref 0.0–0.2)
nRBC: 0 /100 WBC

## 2021-12-28 LAB — GLUCOSE, CAPILLARY
Glucose-Capillary: 94 mg/dL (ref 70–99)
Glucose-Capillary: 206 mg/dL — ABNORMAL HIGH (ref 70–99)

## 2021-12-28 LAB — BASIC METABOLIC PANEL
Anion gap: 9 (ref 5–15)
BUN: 24 mg/dL — ABNORMAL HIGH (ref 8–23)
CO2: 20 mmol/L — ABNORMAL LOW (ref 22–32)
Calcium: 8.3 mg/dL — ABNORMAL LOW (ref 8.9–10.3)
Chloride: 108 mmol/L (ref 98–111)
Creatinine, Ser: 2.38 mg/dL — ABNORMAL HIGH (ref 0.61–1.24)
GFR, Estimated: 26 mL/min — ABNORMAL LOW (ref >60)
Glucose, Bld: 91 mg/dL (ref 70–99)
Potassium: 4.4 mmol/L (ref 3.5–5.1)
Sodium: 137 mmol/L (ref 135–145)

## 2021-12-28 LAB — BRAIN NATRIURETIC PEPTIDE: B Natriuretic Peptide: 1726.3 pg/mL — ABNORMAL HIGH (ref 0.0–100.0)

## 2021-12-28 MED ORDER — METOPROLOL SUCCINATE ER 50 MG PO TB24
50.0000 mg | ORAL_TABLET | Freq: Every day | ORAL | Status: DC
  Administered 2021-12-28 – 2022-01-01 (×5): 50 mg via ORAL
  Filled 2021-12-28 (×5): qty 1

## 2021-12-28 MED ORDER — HYDRALAZINE HCL 50 MG PO TABS
100.0000 mg | ORAL_TABLET | Freq: Three times a day (TID) | ORAL | Status: DC
  Administered 2021-12-28 – 2022-01-01 (×12): 100 mg via ORAL
  Filled 2021-12-28 (×12): qty 2

## 2021-12-28 MED ORDER — PANCRELIPASE (LIP-PROT-AMYL) 12000-38000 UNITS PO CPEP
24000.0000 [IU] | ORAL_CAPSULE | Freq: Every day | ORAL | Status: DC
  Administered 2021-12-29 – 2022-01-01 (×4): 24000 [IU] via ORAL
  Filled 2021-12-28 (×4): qty 2

## 2021-12-28 MED ORDER — SODIUM BICARBONATE 650 MG PO TABS
650.0000 mg | ORAL_TABLET | Freq: Two times a day (BID) | ORAL | Status: DC
  Administered 2021-12-28 – 2022-01-01 (×8): 650 mg via ORAL
  Filled 2021-12-28 (×8): qty 1

## 2021-12-28 MED ORDER — ASPIRIN EC 81 MG PO TBEC
81.0000 mg | DELAYED_RELEASE_TABLET | Freq: Every day | ORAL | Status: DC
  Administered 2021-12-28 – 2021-12-31 (×4): 81 mg via ORAL
  Filled 2021-12-28 (×4): qty 1

## 2021-12-28 MED ORDER — PANCRELIPASE (LIP-PROT-AMYL) 25000-79000 UNITS PO CPEP
1.0000 | ORAL_CAPSULE | Freq: Every day | ORAL | Status: DC
Start: 2021-12-29 — End: 2021-12-28

## 2021-12-28 MED ORDER — PANTOPRAZOLE SODIUM 40 MG PO TBEC
40.0000 mg | DELAYED_RELEASE_TABLET | Freq: Every day | ORAL | Status: DC
  Administered 2021-12-28 – 2022-01-01 (×5): 40 mg via ORAL
  Filled 2021-12-28 (×5): qty 1

## 2021-12-28 MED ORDER — ACETAMINOPHEN 325 MG PO TABS
650.0000 mg | ORAL_TABLET | ORAL | Status: DC | PRN
  Administered 2021-12-29: 650 mg via ORAL
  Filled 2021-12-28: qty 2

## 2021-12-28 MED ORDER — HEPARIN SODIUM (PORCINE) 5000 UNIT/ML IJ SOLN
5000.0000 [IU] | Freq: Three times a day (TID) | INTRAMUSCULAR | Status: DC
  Administered 2021-12-28 – 2022-01-01 (×12): 5000 [IU] via SUBCUTANEOUS
  Filled 2021-12-28 (×12): qty 1

## 2021-12-28 MED ORDER — LEVOTHYROXINE SODIUM 75 MCG PO TABS
150.0000 ug | ORAL_TABLET | Freq: Every day | ORAL | Status: DC
Start: 2021-12-29 — End: 2022-01-01
  Administered 2021-12-29 – 2022-01-01 (×4): 150 ug via ORAL
  Filled 2021-12-28 (×4): qty 2

## 2021-12-28 MED ORDER — NITROGLYCERIN 0.4 MG SL SUBL: 0.4000 mg | SUBLINGUAL_TABLET | SUBLINGUAL | Status: DC | PRN

## 2021-12-28 MED ORDER — INSULIN ASPART 100 UNIT/ML IJ SOLN
0.0000 [IU] | Freq: Three times a day (TID) | INTRAMUSCULAR | Status: DC
  Administered 2021-12-29: 3 [IU] via SUBCUTANEOUS
  Administered 2021-12-29: 5 [IU] via SUBCUTANEOUS
  Administered 2021-12-29: 2 [IU] via SUBCUTANEOUS
  Administered 2021-12-30 (×3): 3 [IU] via SUBCUTANEOUS
  Administered 2021-12-31: 11 [IU] via SUBCUTANEOUS
  Administered 2021-12-31: 8 [IU] via SUBCUTANEOUS
  Administered 2021-12-31: 3 [IU] via SUBCUTANEOUS
  Administered 2022-01-01: 5 [IU] via SUBCUTANEOUS
  Administered 2022-01-01: 8 [IU] via SUBCUTANEOUS

## 2021-12-28 MED ORDER — FUROSEMIDE 10 MG/ML IJ SOLN
40.0000 mg | Freq: Two times a day (BID) | INTRAMUSCULAR | Status: DC
  Administered 2021-12-28 – 2021-12-31 (×6): 40 mg via INTRAVENOUS
  Filled 2021-12-28 (×6): qty 4

## 2021-12-28 MED ORDER — SIMVASTATIN 20 MG PO TABS
20.0000 mg | ORAL_TABLET | Freq: Every day | ORAL | Status: DC
  Administered 2021-12-28 – 2021-12-31 (×4): 20 mg via ORAL
  Filled 2021-12-28 (×4): qty 1

## 2021-12-28 MED ORDER — ONDANSETRON HCL 4 MG/2ML IJ SOLN: 4.0000 mg | Freq: Four times a day (QID) | INTRAMUSCULAR | Status: DC | PRN

## 2021-12-28 NOTE — Assessment & Plan Note (Signed)
Carotid bruits heard bilaterally.  May be radiation of heart murmur.  Consider carotid Dopplers

## 2021-12-28 NOTE — Assessment & Plan Note (Signed)
Insulin sliding scale

## 2021-12-28 NOTE — H&P (Signed)
Cardiology history and physical:     Date:  12/28/2021    ID:  Ethan Mcneil, DOB 08/25/1934, MRN [id number]   PCP:  Colon Branch, MD              [facility] Providers Cardiologist:  None      Referring MD: Colon Branch, MD      History of Present Illness:     Ethan Mcneil is a 88 y.o. male patient of Dr. Johnsie Cancel and Dr. Larose Kells here for the evaluation of heart failure.  Yesterday Dr. Larose Kells called our office asking for him to be seen urgently secondary to Heart failure.  In review of his office note, Dr. Larose Kells, from 12/27/2021 he was having shortness of breath leg swelling, had been to the ER on 12/28 as well as 1/7.  He had increased swelling of the scrotum as well.  BNP 1900.  Similar to that in December.  Creatinine 2.5 potassium 5.2 hemoglobin 10.9.  He received 40 mg of IV Lasix there.  The IV Lasix made him feel better temporarily.  He is not weighing himself daily.  Edema is still present and quite prolific.  Yesterday Dr. Larose Kells increased his Lasix from 20 mg every other day to 40 mg a day.  He has not urinated significantly since then.  His weight may have gone up he states.   Has a history of CAD with CABG in 1997 prior cardiac cath in 2009.  Has chronic kidney disease as well.  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   Today he describes fairly massive edema that has been accumulating over the last few months.  His NYHA class is 2-3.  Denies any syncope bleeding.  His swelling is mildly improved in the morning hours he states after waking up.   He denies any resting chest discomfort.  He does state that sometimes with activity he may feel mild chest discomfort.   CABG 1997, cath 2009 patent grafts.   His granddaughter is a Marine scientist in [place].   He enjoys experimental aircraft.   Lab work yesterday showed BUN of 28 creatinine of 2.79 potassium 4.3 GFR of 20.  Back in November his creatinine was 2.9       Past Medical History:  Diagnosis Date    Anemia     Arthritis     CAD (coronary artery disease)      CABG 1997; grafts patent @ cath [facility] 11/09   DM2 (diabetes mellitus, type 2) ([place])     Gout      after tick bite   History of kidney stones     HLD (hyperlipidemia)     HTN (hypertension)      essential nos   Hypothyroid     Pancreatic lesion     Postsurgical aortocoronary bypass status     PVD (peripheral vascular disease) ([place])     S/P herniorrhaphy     Tick fever 2012    [facility] [place]   Unspecified hyperplasia of prostate without urinary obstruction and other lower urinary tract symptoms (LUTS)     Urolithiasis 2015    hx of   Wears dentures     Wears glasses             Past Surgical History:  Procedure Laterality Date   BIOPSY   08/07/2020    Procedure: BIOPSY;  Surgeon: Irving Copas., MD;  Location: [facility] ENDOSCOPY;  Service:  Gastroenterology;;   CATARACT EXTRACTION Bilateral     CORONARY ARTERY BYPASS GRAFT        x7 vessels 1997; cath 2002 and 2009; grafts patent    CYSTOSCOPY WITH RETROGRADE PYELOGRAM, URETEROSCOPY AND STENT PLACEMENT Right 02/21/2014    Procedure: CYSTOSCOPY WITH RIGHT  RETROGRADE PYELOGRAM,RIGHT  URETEROSCOPY WITH STONE BASKETING EXTRACTION;  Surgeon: Irine Seal, MD;  Location: [facility] ORS;  Service: Urology;  Laterality: Right;   ESOPHAGOGASTRODUODENOSCOPY (EGD) WITH PROPOFOL N/A 08/07/2020    Procedure: ESOPHAGOGASTRODUODENOSCOPY (EGD) WITH PROPOFOL;  Surgeon: Rush Landmark Telford Nab., MD;  Location: [place];  Service: Gastroenterology;  Laterality: N/A;   EUS N/A 08/07/2020    Procedure: UPPER ENDOSCOPIC ULTRASOUND (EUS) LINEAR;  Surgeon: Irving Copas., MD;  Location: [place];  Service: Gastroenterology;  Laterality: N/A;   FINE NEEDLE ASPIRATION N/A 08/07/2020    Procedure: FINE NEEDLE ASPIRATION (FNA) LINEAR;  Surgeon: Irving Copas., MD;  Location: [place];  Service: Gastroenterology;  Laterality: N/A;   HERNIA REPAIR        INGUINAL HERNIA REPAIR       MULTIPLE TOOTH EXTRACTIONS       SHOULDER SURGERY       SPLENECTOMY, TOTAL N/A 10/26/2020    Procedure: SPLENECTOMY;  Surgeon: Stark Klein, MD;  Location: [place];  Service: General;  Laterality: N/A;      Current Medications: Active Medications      Current Meds  Medication Sig   acetaminophen (TYLENOL) 500 MG tablet Take 500 mg by mouth every 6 (six) hours as needed for moderate pain or headache.   aspirin EC 81 MG tablet Take 81 mg by mouth at bedtime.    B-D UF III MINI PEN NEEDLES 31G X 5 MM MISC USE 3 TIMES A DAY AS DIRECTED   Cholecalciferol (VITAMIN D3) 50 MCG (2000 UT) capsule Take 2,000 Units by mouth daily.   Continuous Blood Gluc Sensor (FREESTYLE LIBRE 14 DAY SENSOR) MISC USE 1 SENSOR EVERY 14 (FOURTEEN) DAYS.   Cyanocobalamin (B-12) 1000 MCG TABS Take 1,000 mcg by mouth daily.   FREESTYLE LITE test strip CHECK BLOOD SUGAR NO MORE THAN TWICE DAILY.   furosemide (LASIX) 40 MG tablet Take 1 tablet (40 mg total) by mouth daily.   hydrALAZINE (APRESOLINE) 100 MG tablet Take 100 mg by mouth 3 (three) times daily.   insulin aspart (NOVOLOG FLEXPEN) 100 UNIT/ML FlexPen 3 times a day (just before each meal), 5-6-6 units.   Insulin Glargine (BASAGLAR KWIKPEN) 100 UNIT/ML Inject 6 Units into the skin daily.   levothyroxine (SYNTHROID) 150 MCG tablet Take 1 tablet (150 mcg total) by mouth daily before breakfast.   metoprolol succinate (TOPROL-XL) 50 MG 24 hr tablet Take 1 tablet (50 mg total) by mouth daily. TAKE WITH OR IMMEDIATELY FOLLOWING A MEAL.   omeprazole (PRILOSEC) 40 MG capsule Take 1 capsule (40 mg total) by mouth daily.   Pancrelipase, Lip-Prot-Amyl, (ZENPEP) 25000-79000 units CPEP Take 2 capsules by mouth with every meal and snacks (Patient taking differently: Take 1 capsule by mouth daily at 12 noon.)   simvastatin (ZOCOR) 20 MG tablet TAKE 1 TABLET BY MOUTH EVERYDAY AT BEDTIME   sodium bicarbonate 650 MG tablet Take 650 mg by mouth 2 (two)  times daily.        Allergies:   Ciprofloxacin and Codeine    Social History         Socioeconomic History   Marital status: Married      Spouse name: [name]   Number  of children: 4   Years of education: Not on file   Highest education level: Not on file  Occupational History   Occupation: retired, works few hours   Tobacco Use   Smoking status: Never   Smokeless tobacco: Never  Vaping Use   Vaping Use: Never used  Substance and Sexual Activity   Alcohol use: No   Drug use: No   Sexual activity: Not Currently  Other Topics Concern   Not on file  Social History Narrative    Buyer, retail and former race car driver    4 children (boys)    Radio producer form signed appointing wife - [name]; ok to leave msg on home phone [phone]              Social Determinants of Health       Financial Resource Strain: Low Risk    Difficulty of Paying Living Expenses: Not hard at all  Food Insecurity: No Food Insecurity   Worried About Charity fundraiser in the Last Year: Never true   Arboriculturist in the Last Year: Never true  Transportation Needs: No Transportation Needs   Lack of Transportation (Medical): No   Lack of Transportation (Non-Medical): No  Physical Activity: Insufficiently Active   Days of Exercise per Week: 5 days   Minutes of Exercise per Session: 20 min  Stress: No Stress Concern Present   Feeling of Stress : Only a little  Social Connections: Moderately Integrated   Frequency of Communication with Friends and Family: More than three times a week   Frequency of Social Gatherings with Friends and Family: More than three times a week   Attends Religious Services: More than 4 times per year   Active Member of [employer] or Organizations: No   Attends Music therapist: Never   Marital Status: Married      Family History: The patient's family history includes Coronary artery disease in his mother; Diabetes in his brother; Lung cancer in  his father; Prostate cancer in his maternal uncle; Stroke in his son; Throat cancer in his son. There is no history of Colon cancer, Rectal cancer, Stomach cancer, Esophageal cancer, or Pancreatic cancer.   ROS:   Please see the history of present illness.     All other systems reviewed and are negative.   EKGs/Labs/Other Studies Reviewed:     The following studies were reviewed today: 2009 cardiac catheterization, prior office notes, lab work   EKG:  EKG is  ordered today.  The ekg ordered today demonstrates sinus bradycardia 57 nonspecific ST-T wave changes   Recent Labs: 01/04/2021: ALT 15 07/05/2021: Platelets 253 10/15/2021: Hemoglobin 8.3 12/27/2021: BUN 28; Creatinine, Ser 2.79; Potassium 4.3; Sodium 139; TSH 2.20  Recent Lipid Panel Labs (Brief)          Component Value Date/Time    CHOL 109 09/25/2021 0901    TRIG 87.0 09/25/2021 0901    HDL 48.60 09/25/2021 0901    CHOLHDL 2 09/25/2021 0901    VLDL 17.4 09/25/2021 0901    LDLCALC 43 09/25/2021 0901          Risk Assessment/Calculations:                  Physical Exam:     VS:  BP (!) 150/60 (BP Location: Left Arm, Patient Position: Sitting, Cuff Size: Normal)    Pulse (!) 57    Ht 5\' 8"  (1.727 m)    Wt  194 lb (88 kg)    SpO2 93%    BMI 29.50 kg/m         Wt Readings from Last 3 Encounters:  12/28/21 194 lb (88 kg)  12/27/21 193 lb (87.5 kg)  12/21/21 198 lb 3.2 oz (89.9 kg)      GEN:  Well nourished, well developed in no acute distress HEENT: Normal NECK: Midneck JVD; bilateral carotid bruits LYMPHATICS: No lymphadenopathy CARDIAC: RRR, 2/6 systolic murmur, no rubs, gallops RESPIRATORY: Mild crackles at bases ABDOMEN: Soft, non-tender, non-distended MUSCULOSKELETAL: 4+ lower extremity edema entire leg, scrotal edema;  SKIN: Warm and dry NEUROLOGIC:  Alert and oriented x 3 PSYCHIATRIC:  Normal affect    ASSESSMENT:     1. Coronary artery disease due to lipid rich plaque   2. Hypertension,  unspecified type   3. Acute combined systolic and diastolic heart failure ([place])   4. PVD   5. Type 2 diabetes mellitus with diabetic peripheral angiopathy without gangrene, with long-term current use of insulin ([place])   6. Chronic kidney disease, stage IV (severe) (HCC)     PLAN:     In order of problems listed above:   Acute combined systolic and diastolic heart failure (HCC) We are going to admit to the hospital for further care Marked lower extremity edema/scrotal edema.  Has several pounds of fluid to be diuresed.  NYHA class II-III symptoms. Bedside echo here in clinic demonstrates EF approximately 45 to 50% in the views that I was able to obtain.  We will obtain formal echocardiogram in hospital setting.  Mild mitral regurgitation noted as well.  Murmur heard on exam.  Unable to fully assess aortic valve. -Longstanding accumulation of lower extremity fluid - Start IV Lasix 40 mg IV twice daily - Continue to monitor creatinine closely.  Has chronic kidney disease stage IV, creatinine 2.8 - Check echo - Salt and fluid restriction per heart failure protocol - Compression stockings   CAD (coronary artery disease) Prior bypass surgery, heart catheterization 2009 showed patent grafts.  He is not having any anginal symptoms.  Continue with aspirin 81 mg, metoprolol 50 mg a day.   HTN (hypertension) Overall reasonably controlled.   PVD Carotid bruits heard bilaterally.  May be radiation of heart murmur.  Consider carotid Dopplers   Diabetes (HCC) Insulin sliding scale   Chronic kidney disease, stage IV (severe) (HCC) 2.8 creatinine.  Monitor closely with ongoing diuresis.  On hydralazine as well as Toprol for blood pressure.  Avoid ACE/ARB             Medication Adjustments/Labs and Tests Ordered: Current medicines are reviewed at length with the patient today.  Concerns regarding medicines are outlined above.     Orders Placed This Encounter  Procedures   EKG 12-Lead     No orders of the defined types were placed in this encounter.     Patient Instructions  Medication Instructions:  The current medical regimen is effective;  continue present plan and medications.   *If you need a refill on your cardiac medications before your next appointment, please call your pharmacy*   Please report to admitting at [place]: At [facility], you and your health needs are our priority.  As part of our continuing mission to provide you with exceptional heart care, we have created designated Provider Care Teams.  These Care Teams include your primary Cardiologist (physician) and Advanced Practice Providers (APPs -  Physician Assistants and Nurse Practitioners) who  all work together to provide you with the care you need, when you need it.   We recommend signing up for the patient portal called "MyChart".  Sign up information is provided on this After Visit Summary.  MyChart is used to connect with patients for Virtual Visits (Telemedicine).  Patients are able to view lab/test results, encounter notes, upcoming appointments, etc.  Non-urgent messages can be sent to your provider as well.   To learn more about what you can do with MyChart, go to [email or web address].     Your next appointment:   Follow up will be determined after your hospitalization.   Thank you for choosing [facility]       Signed, Candee Furbish, MD  12/28/2021 12:54 PM    [place]

## 2021-12-28 NOTE — Plan of Care (Signed)

## 2021-12-28 NOTE — Assessment & Plan Note (Addendum)
We are going to admit to the hospital for further care Marked lower extremity edema/scrotal edema.  Has several pounds of fluid to be diuresed.  NYHA class II-III symptoms. Bedside echo here in clinic demonstrates EF approximately 45 to 50% in the views that I was able to obtain.  We will obtain formal echocardiogram in hospital setting.  Mild mitral regurgitation noted as well.  Murmur heard on exam.  Unable to fully assess aortic valve. -Longstanding accumulation of lower extremity fluid - Start IV Lasix 40 mg IV twice daily - Continue to monitor creatinine closely.  Has chronic kidney disease stage IV, creatinine 2.8 - Check echo - Salt and fluid restriction per heart failure protocol - Compression stockings

## 2021-12-28 NOTE — Patient Instructions (Signed)
Medication Instructions:  The current medical regimen is effective;  continue present plan and medications.  *If you need a refill on your cardiac medications before your next appointment, please call your pharmacy*  Please report to admitting at [place]: At [facility], you and your health needs are our priority.  As part of our continuing mission to provide you with exceptional heart care, we have created designated Provider Care Teams.  These Care Teams include your primary Cardiologist (physician) and Advanced Practice Providers (APPs -  Physician Assistants and Nurse Practitioners) who all work together to provide you with the care you need, when you need it.  We recommend signing up for the patient portal called "MyChart".  Sign up information is provided on this After Visit Summary.  MyChart is used to connect with patients for Virtual Visits (Telemedicine).  Patients are able to view lab/test results, encounter notes, upcoming appointments, etc.  Non-urgent messages can be sent to your provider as well.   To learn more about what you can do with MyChart, go to [email or web address].    Your next appointment:   Follow up will be determined after your hospitalization.  Thank you for choosing [facility]!!

## 2021-12-28 NOTE — Assessment & Plan Note (Signed)
2.8 creatinine.  Monitor closely with ongoing diuresis.  On hydralazine as well as Toprol for blood pressure.  Avoid ACE/ARB

## 2021-12-28 NOTE — Progress Notes (Signed)
Cardiology Office Note/history and physical:    Date:  12/28/2021   ID:  Ethan Mcneil, DOB 06/27/1934, MRN [id number]  PCP:  Colon Branch, MD   [facility] Providers Cardiologist:  None     Referring MD: Colon Branch, MD    History of Present Illness:    Ethan Mcneil is a 88 y.o. male patient of Dr. Johnsie Cancel and Dr. Larose Kells here for the evaluation of heart failure.  Yesterday Dr. Larose Kells called our office asking for him to be seen urgently secondary to Heart failure.  In review of his office note, Dr. Larose Kells, from 12/27/2021 he was having shortness of breath leg swelling, had been to the ER on 12/28 as well as 1/7.  He had increased swelling of the scrotum as well.  BNP 1900.  Similar to that in December.  Creatinine 2.5 potassium 5.2 hemoglobin 10.9.  He received 40 mg of IV Lasix there.  The IV Lasix made him feel better temporarily.  He is not weighing himself daily.  Edema is still present and quite prolific.  Yesterday Dr. Larose Kells increased his Lasix from 20 mg every other day to 40 mg a day.  He has not urinated significantly since then.  His weight may have gone up he states.  Has a history of CAD with CABG in 1997 prior cardiac cath in 2009.  Has chronic kidney disease as well.  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  Today he describes fairly massive edema that has been accumulating over the last few months.  His NYHA class is 2-3.  Denies any syncope bleeding.  His swelling is mildly improved in the morning hours he states after waking up.  He denies any resting chest discomfort.  He does state that sometimes with activity he may feel mild chest discomfort.  CABG 1997, cath 2009 patent grafts.  His granddaughter is a Marine scientist in [place].  He enjoys experimental aircraft.  Lab work yesterday showed BUN of 28 creatinine of 2.79 potassium 4.3 GFR of 20.  Back in November his creatinine was 2.9  Past Medical History:  Diagnosis Date   Anemia    Arthritis     CAD (coronary artery disease)    CABG 1997; grafts patent @ cath [facility] 11/09   DM2 (diabetes mellitus, type 2) ([place])    Gout    after tick bite   History of kidney stones    HLD (hyperlipidemia)    HTN (hypertension)    essential nos   Hypothyroid    Pancreatic lesion    Postsurgical aortocoronary bypass status    PVD (peripheral vascular disease) ([place])    S/P herniorrhaphy    Tick fever 2012   [facility] [place]   Unspecified hyperplasia of prostate without urinary obstruction and other lower urinary tract symptoms (LUTS)    Urolithiasis 2015   hx of   Wears dentures    Wears glasses     Past Surgical History:  Procedure Laterality Date   BIOPSY  08/07/2020   Procedure: BIOPSY;  Surgeon: Irving Copas., MD;  Location: [place];  Service: Gastroenterology;;   CATARACT EXTRACTION Bilateral    CORONARY ARTERY BYPASS GRAFT     x7 vessels 1997; cath 2002 and 2009; grafts patent    [place], URETEROSCOPY AND STENT PLACEMENT Right 02/21/2014   Procedure: CYSTOSCOPY WITH RIGHT  RETROGRADE PYELOGRAM,RIGHT  URETEROSCOPY WITH STONE BASKETING EXTRACTION;  Surgeon: Irine Seal, MD;  Location: [facility];  Service: Urology;  Laterality: Right;   ESOPHAGOGASTRODUODENOSCOPY (EGD) WITH PROPOFOL N/A 08/07/2020   Procedure: ESOPHAGOGASTRODUODENOSCOPY (EGD) WITH PROPOFOL;  Surgeon: Rush Landmark Telford Nab., MD;  Location: [place];  Service: Gastroenterology;  Laterality: N/A;   EUS N/A 08/07/2020   Procedure: UPPER ENDOSCOPIC ULTRASOUND (EUS) LINEAR;  Surgeon: Irving Copas., MD;  Location: [place];  Service: Gastroenterology;  Laterality: N/A;   FINE NEEDLE ASPIRATION N/A 08/07/2020   Procedure: FINE NEEDLE ASPIRATION (FNA) LINEAR;  Surgeon: Irving Copas., MD;  Location: [place];  Service: Gastroenterology;  Laterality: N/A;   HERNIA REPAIR     INGUINAL HERNIA REPAIR     MULTIPLE TOOTH EXTRACTIONS     SHOULDER  SURGERY     SPLENECTOMY, TOTAL N/A 10/26/2020   Procedure: SPLENECTOMY;  Surgeon: Stark Klein, MD;  Location: [place];  Service: General;  Laterality: N/A;    Current Medications: Current Meds  Medication Sig   acetaminophen (TYLENOL) 500 MG tablet Take 500 mg by mouth every 6 (six) hours as needed for moderate pain or headache.   aspirin EC 81 MG tablet Take 81 mg by mouth at bedtime.    B-D UF III MINI PEN NEEDLES 31G X 5 MM MISC USE 3 TIMES A DAY AS DIRECTED   Cholecalciferol (VITAMIN D3) 50 MCG (2000 UT) capsule Take 2,000 Units by mouth daily.   Continuous Blood Gluc Sensor (FREESTYLE LIBRE 14 DAY SENSOR) MISC USE 1 SENSOR EVERY 14 (FOURTEEN) DAYS.   Cyanocobalamin (B-12) 1000 MCG TABS Take 1,000 mcg by mouth daily.   FREESTYLE LITE test strip CHECK BLOOD SUGAR NO MORE THAN TWICE DAILY.   furosemide (LASIX) 40 MG tablet Take 1 tablet (40 mg total) by mouth daily.   hydrALAZINE (APRESOLINE) 100 MG tablet Take 100 mg by mouth 3 (three) times daily.   insulin aspart (NOVOLOG FLEXPEN) 100 UNIT/ML FlexPen 3 times a day (just before each meal), 5-6-6 units.   Insulin Glargine (BASAGLAR KWIKPEN) 100 UNIT/ML Inject 6 Units into the skin daily.   levothyroxine (SYNTHROID) 150 MCG tablet Take 1 tablet (150 mcg total) by mouth daily before breakfast.   metoprolol succinate (TOPROL-XL) 50 MG 24 hr tablet Take 1 tablet (50 mg total) by mouth daily. TAKE WITH OR IMMEDIATELY FOLLOWING A MEAL.   omeprazole (PRILOSEC) 40 MG capsule Take 1 capsule (40 mg total) by mouth daily.   Pancrelipase, Lip-Prot-Amyl, (ZENPEP) 25000-79000 units CPEP Take 2 capsules by mouth with every meal and snacks (Patient taking differently: Take 1 capsule by mouth daily at 12 noon.)   simvastatin (ZOCOR) 20 MG tablet TAKE 1 TABLET BY MOUTH EVERYDAY AT BEDTIME   sodium bicarbonate 650 MG tablet Take 650 mg by mouth 2 (two) times daily.     Allergies:   Ciprofloxacin and Codeine   Social History   Socioeconomic History    Marital status: Married    Spouse name: Pat   Number of children: 4   Years of education: Not on file   Highest education level: Not on file  Occupational History   Occupation: retired, works few hours   Tobacco Use   Smoking status: Never   Smokeless tobacco: Never  Vaping Use   Vaping Use: Never used  Substance and Sexual Activity   Alcohol use: No   Drug use: No   Sexual activity: Not Currently  Other Topics Concern   Not on file  Social History Narrative   Buyer, retail and former race car driver   4  children (boys)   Designated party form signed appointing wife - [name]; ok to leave msg on home phone [phone]         Social Determinants of Health   Financial Resource Strain: Low Risk    Difficulty of Paying Living Expenses: Not hard at all  Food Insecurity: No Food Insecurity   Worried About Charity fundraiser in the Last Year: Never true   Arboriculturist in the Last Year: Never true  Transportation Needs: No Transportation Needs   Lack of Transportation (Medical): No   Lack of Transportation (Non-Medical): No  Physical Activity: Insufficiently Active   Days of Exercise per Week: 5 days   Minutes of Exercise per Session: 20 min  Stress: No Stress Concern Present   Feeling of Stress : Only a little  Social Connections: Moderately Integrated   Frequency of Communication with Friends and Family: More than three times a week   Frequency of Social Gatherings with Friends and Family: More than three times a week   Attends Religious Services: More than 4 times per year   Active Member of [employer] or Organizations: No   Attends Music therapist: Never   Marital Status: Married     Family History: The patient's family history includes Coronary artery disease in his mother; Diabetes in his brother; Lung cancer in his father; Prostate cancer in his maternal uncle; Stroke in his son; Throat cancer in his son. There is no history of Colon cancer,  Rectal cancer, Stomach cancer, Esophageal cancer, or Pancreatic cancer.  ROS:   Please see the history of present illness.     All other systems reviewed and are negative.  EKGs/Labs/Other Studies Reviewed:    The following studies were reviewed today: 2009 cardiac catheterization, prior office notes, lab work  EKG:  EKG is  ordered today.  The ekg ordered today demonstrates sinus bradycardia 57 nonspecific ST-T wave changes  Recent Labs: 01/04/2021: ALT 15 07/05/2021: Platelets 253 10/15/2021: Hemoglobin 8.3 12/27/2021: BUN 28; Creatinine, Ser 2.79; Potassium 4.3; Sodium 139; TSH 2.20  Recent Lipid Panel    Component Value Date/Time   CHOL 109 09/25/2021 0901   TRIG 87.0 09/25/2021 0901   HDL 48.60 09/25/2021 0901   CHOLHDL 2 09/25/2021 0901   VLDL 17.4 09/25/2021 0901   LDLCALC 43 09/25/2021 0901     Risk Assessment/Calculations:              Physical Exam:    VS:  BP (!) 150/60 (BP Location: Left Arm, Patient Position: Sitting, Cuff Size: Normal)    Pulse (!) 57    Ht 5\' 8"  (1.727 m)    Wt 194 lb (88 kg)    SpO2 93%    BMI 29.50 kg/m     Wt Readings from Last 3 Encounters:  12/28/21 194 lb (88 kg)  12/27/21 193 lb (87.5 kg)  12/21/21 198 lb 3.2 oz (89.9 kg)     GEN:  Well nourished, well developed in no acute distress HEENT: Normal NECK: Midneck JVD; bilateral carotid bruits LYMPHATICS: No lymphadenopathy CARDIAC: RRR, 2/6 systolic murmur, no rubs, gallops RESPIRATORY: Mild crackles at bases ABDOMEN: Soft, non-tender, non-distended MUSCULOSKELETAL: 4+ lower extremity edema entire leg, scrotal edema;  SKIN: Warm and dry NEUROLOGIC:  Alert and oriented x 3 PSYCHIATRIC:  Normal affect   ASSESSMENT:    1. Coronary artery disease due to lipid rich plaque   2. Hypertension, unspecified type   3. Acute combined systolic and  diastolic heart failure ([place])   4. PVD   5. Type 2 diabetes mellitus with diabetic peripheral angiopathy without gangrene, with  long-term current use of insulin ([place])   6. Chronic kidney disease, stage IV (severe) (HCC)    PLAN:    In order of problems listed above:  Acute combined systolic and diastolic heart failure (HCC) We are going to admit to the hospital for further care Marked lower extremity edema/scrotal edema.  Has several pounds of fluid to be diuresed.  NYHA class II-III symptoms. Bedside echo here in clinic demonstrates EF approximately 45 to 50% in the views that I was able to obtain.  We will obtain formal echocardiogram in hospital setting.  Mild mitral regurgitation noted as well.  Murmur heard on exam.  Unable to fully assess aortic valve. -Longstanding accumulation of lower extremity fluid - Start IV Lasix 40 mg IV twice daily - Continue to monitor creatinine closely.  Has chronic kidney disease stage IV, creatinine 2.8 - Check echo - Salt and fluid restriction per heart failure protocol - Compression stockings  CAD (coronary artery disease) Prior bypass surgery, heart catheterization 2009 showed patent grafts.  He is not having any anginal symptoms.  Continue with aspirin 81 mg, metoprolol 50 mg a day.  HTN (hypertension) Overall reasonably controlled.  PVD Carotid bruits heard bilaterally.  May be radiation of heart murmur.  Consider carotid Dopplers  Diabetes (HCC) Insulin sliding scale  Chronic kidney disease, stage IV (severe) (HCC) 2.8 creatinine.  Monitor closely with ongoing diuresis.  On hydralazine as well as Toprol for blood pressure.  Avoid ACE/ARB         Medication Adjustments/Labs and Tests Ordered: Current medicines are reviewed at length with the patient today.  Concerns regarding medicines are outlined above.  Orders Placed This Encounter  Procedures   EKG 12-Lead   No orders of the defined types were placed in this encounter.   Patient Instructions  Medication Instructions:  The current medical regimen is effective;  continue present plan and  medications.  *If you need a refill on your cardiac medications before your next appointment, please call your pharmacy*  Please report to admitting at [place]: At [facility], you and your health needs are our priority.  As part of our continuing mission to provide you with exceptional heart care, we have created designated Provider Care Teams.  These Care Teams include your primary Cardiologist (physician) and Advanced Practice Providers (APPs -  Physician Assistants and Nurse Practitioners) who all work together to provide you with the care you need, when you need it.  We recommend signing up for the patient portal called "MyChart".  Sign up information is provided on this After Visit Summary.  MyChart is used to connect with patients for Virtual Visits (Telemedicine).  Patients are able to view lab/test results, encounter notes, upcoming appointments, etc.  Non-urgent messages can be sent to your provider as well.   To learn more about what you can do with MyChart, go to [email or web address].    Your next appointment:   Follow up will be determined after your hospitalization.  Thank you for choosing [facility]!!      Signed, Candee Furbish, MD  12/28/2021 12:54 PM    [place]

## 2021-12-28 NOTE — Assessment & Plan Note (Signed)
Prior bypass surgery, heart catheterization 2009 showed patent grafts.  He is not having any anginal symptoms.  Continue with aspirin 81 mg, metoprolol 50 mg a day.

## 2021-12-28 NOTE — Assessment & Plan Note (Signed)
Overall reasonably controlled.

## 2021-12-29 ENCOUNTER — Inpatient Hospital Stay (HOSPITAL_COMMUNITY): Payer: Medicare HMO

## 2021-12-29 DIAGNOSIS — I5021 Acute systolic (congestive) heart failure: Secondary | ICD-10-CM | POA: Diagnosis not present

## 2021-12-29 DIAGNOSIS — I5043 Acute on chronic combined systolic (congestive) and diastolic (congestive) heart failure: Secondary | ICD-10-CM | POA: Diagnosis not present

## 2021-12-29 LAB — GLUCOSE, CAPILLARY
Glucose-Capillary: 187 mg/dL — ABNORMAL HIGH (ref 70–99)
Glucose-Capillary: 138 mg/dL — ABNORMAL HIGH (ref 70–99)
Glucose-Capillary: 219 mg/dL — ABNORMAL HIGH (ref 70–99)
Glucose-Capillary: 206 mg/dL — ABNORMAL HIGH (ref 70–99)

## 2021-12-29 LAB — ECHOCARDIOGRAM COMPLETE
Area-P 1/2: 3.46 cm2
Height: 68 in
MV VTI: 2.1 cm2
S' Lateral: 3.9 cm
Weight: 3097.02 oz

## 2021-12-29 LAB — BASIC METABOLIC PANEL
Anion gap: 9 (ref 5–15)
BUN: 24 mg/dL — ABNORMAL HIGH (ref 8–23)
CO2: 20 mmol/L — ABNORMAL LOW (ref 22–32)
Calcium: 8 mg/dL — ABNORMAL LOW (ref 8.9–10.3)
Chloride: 105 mmol/L (ref 98–111)
Creatinine, Ser: 2.41 mg/dL — ABNORMAL HIGH (ref 0.61–1.24)
GFR, Estimated: 26 mL/min — ABNORMAL LOW (ref >60)
Glucose, Bld: 184 mg/dL — ABNORMAL HIGH (ref 70–99)
Potassium: 4.5 mmol/L (ref 3.5–5.1)
Sodium: 134 mmol/L — ABNORMAL LOW (ref 135–145)

## 2021-12-29 NOTE — Progress Notes (Signed)
Echocardiogram 2D Echocardiogram has been performed.  Oneal Deputy Sible Straley RDCS 12/29/2021, 3:45 PM

## 2021-12-29 NOTE — Progress Notes (Signed)
Progress Note  Patient Name: Ethan Mcneil Date of Encounter: 12/29/2021  Primary Cardiologist:   None   Subjective   Breathing OK and no pain.   Inpatient Medications    Scheduled Meds:  aspirin EC  81 mg Oral QHS   furosemide  40 mg Intravenous BID   heparin  5,000 Units Subcutaneous Q8H   hydrALAZINE  100 mg Oral TID   insulin aspart  0-15 Units Subcutaneous TID WC   levothyroxine  150 mcg Oral QAC breakfast   lipase/protease/amylase  24,000 Units Oral Daily   metoprolol succinate  50 mg Oral Daily   pantoprazole  40 mg Oral Daily   simvastatin  20 mg Oral q1800   sodium bicarbonate  650 mg Oral BID   Continuous Infusions:  PRN Meds: acetaminophen, nitroGLYCERIN, ondansetron (ZOFRAN) IV   Vital Signs    Vitals:   12/28/21 2030 12/28/21 2340 12/29/21 0435 12/29/21 0750  BP: (!) 162/66 (!) 158/65 (!) 164/58 (!) 162/59  Pulse: 73 68 69 60  Resp:   18 18  Temp: 98 F (36.7 C) 98.2 F (36.8 C) 98.8 F (37.1 C) 98.3 F (36.8 C)  TempSrc: Oral Oral Oral Oral  SpO2: 91% 91% 91% 96%  Weight:   87.8 kg     Intake/Output Summary (Last 24 hours) at 12/29/2021 0958 Last data filed at 12/29/2021 0751 Gross per 24 hour  Intake 720 ml  Output 3308 ml  Net -2588 ml   Filed Weights   12/29/21 0435  Weight: 87.8 kg    Telemetry    NSR - Personally Reviewed  ECG    NA - Personally Reviewed  Physical Exam   GEN: No acute distress.   Neck: No  JVD Cardiac: RRR,  no murmurs, rubs, or gallops.  Respiratory: Clear to auscultation bilaterally. GI: Soft, nontender, non-distended  MS:   Moderate leg swelling to mid thigh; No deformity. Neuro:  Nonfocal  Psych: Normal affect   Labs    Chemistry Recent Labs  Lab 12/27/21 1026 12/28/21 1647 12/29/21 0410  NA 139 137 134*  K 4.3 4.4 4.5  CL 105 108 105  CO2 23 20* 20*  GLUCOSE 50* 91 184*  BUN 28* 24* 24*  CREATININE 2.79* 2.38* 2.41*  CALCIUM 8.3* 8.3* 8.0*  GFRNONAA  --  26* 26*  ANIONGAP  --   9 9     Hematology Recent Labs  Lab 12/28/21 1647  WBC 8.0  RBC 3.43*  HGB 10.4*  HCT 32.1*  MCV 93.6  MCH 30.3  MCHC 32.4  RDW 16.2*  PLT 178    Cardiac EnzymesNo results for input(s): TROPONINI in the last 168 hours. No results for input(s): TROPIPOC in the last 168 hours.   BNP Recent Labs  Lab 12/28/21 1647  BNP 1,726.3*     DDimer No results for input(s): DDIMER in the last 168 hours.   Radiology    DG Chest 2 View  Result Date: 12/27/2021 CLINICAL DATA:  88 year old male with a history of CHF EXAM: CHEST - 2 VIEW COMPARISON:  05/26/2021 FINDINGS: Cardiomediastinal silhouette unchanged in size and contour. Surgical changes of median sternotomy and CABG. Similar appearance of patchy opacity in the right mid lung with new bilateral pleural effusions, interlobular septal thickening. Stigmata of emphysema, with increased retrosternal airspace, flattened hemidiaphragms, increased AP diameter, and hyperinflation on the AP view. Degenerative changes the spine.  No displaced fracture. No pneumothorax IMPRESSION: Evidence of acute congestive heart failure with small  pleural effusions. Surgical changes of median sternotomy and CABG Electronically Signed   By: Corrie Mckusick D.O.   On: 12/27/2021 12:28    Cardiac Studies   Echo:  Pending  Patient Profile     88 y.o. male with a history of CAD/CABG, CKD.  He presented with anasarca.    Assessment & Plan    Acute combined systolic and diastolic heart failure ([place]):   EF bedside 45 - 50%.    Net negative 2600 cc.   Continue current diuresis.  Apply knee high compression stockings.    CAD (coronary artery disease):  No plan for invasive work up.  No unstable symptoms.     HTN (hypertension):    BPs are running slightly high.     Diabetes [facility]):   Continue SSI   Chronic kidney disease, stage IV (severe) ([place]):     Creat is stable and tolerating current diuresis.      For questions or updates, please contact [place] Please consult [email or web address] for contact info under Cardiology/STEMI.   Signed, Minus Breeding, MD  12/29/2021, 9:58 AM

## 2021-12-29 NOTE — Plan of Care (Signed)

## 2021-12-29 NOTE — Progress Notes (Signed)
Pt ambulated in the hallway tolerated very well. O2 dropped to 85 on RA placed back on 2L. TED hose ordered no XL available.

## 2021-12-30 DIAGNOSIS — I5043 Acute on chronic combined systolic (congestive) and diastolic (congestive) heart failure: Secondary | ICD-10-CM | POA: Diagnosis not present

## 2021-12-30 LAB — BASIC METABOLIC PANEL
Anion gap: 11 (ref 5–15)
BUN: 26 mg/dL — ABNORMAL HIGH (ref 8–23)
CO2: 24 mmol/L (ref 22–32)
Calcium: 8.4 mg/dL — ABNORMAL LOW (ref 8.9–10.3)
Chloride: 103 mmol/L (ref 98–111)
Creatinine, Ser: 2.71 mg/dL — ABNORMAL HIGH (ref 0.61–1.24)
GFR, Estimated: 22 mL/min — ABNORMAL LOW (ref >60)
Glucose, Bld: 189 mg/dL — ABNORMAL HIGH (ref 70–99)
Potassium: 4.3 mmol/L (ref 3.5–5.1)
Sodium: 138 mmol/L (ref 135–145)

## 2021-12-30 LAB — GLUCOSE, CAPILLARY
Glucose-Capillary: 173 mg/dL — ABNORMAL HIGH (ref 70–99)
Glucose-Capillary: 157 mg/dL — ABNORMAL HIGH (ref 70–99)
Glucose-Capillary: 166 mg/dL — ABNORMAL HIGH (ref 70–99)
Glucose-Capillary: 337 mg/dL — ABNORMAL HIGH (ref 70–99)

## 2021-12-30 MED ORDER — AMLODIPINE BESYLATE 2.5 MG PO TABS
2.5000 mg | ORAL_TABLET | Freq: Every day | ORAL | Status: DC
  Administered 2021-12-30 – 2022-01-01 (×3): 2.5 mg via ORAL
  Filled 2021-12-30 (×3): qty 1

## 2021-12-30 MED ORDER — ISOSORBIDE MONONITRATE ER 30 MG PO TB24
30.0000 mg | ORAL_TABLET | Freq: Every day | ORAL | Status: DC
  Administered 2021-12-30 – 2021-12-31 (×2): 30 mg via ORAL
  Filled 2021-12-30 (×2): qty 1

## 2021-12-30 NOTE — Progress Notes (Signed)
Mobility Specialist Progress Note    12/30/21 1430  Mobility  Activity Ambulated in hall  Level of Assistance Standby assist, set-up cues, supervision of patient - no hands on  Assistive Device None  Distance Ambulated (ft) 220 ft  Mobility Ambulated independently in hallway  Mobility Response Tolerated fair  Mobility performed by Mobility specialist  $Mobility charge 1 Mobility   Pre-Mobility: 61 HR, 165/65 BP, 98% on 2L SpO2  Pt received in bed and agreeable. Ambulated on RA and SpO2 dropped as low as 89%. Pt c/o dizziness upon return to room. Left in bed with call bell in reach and RN notified.   [facility] Mobility Specialist  M.S. 2C and 6E: [phone] M.S. 4E: [phone]

## 2021-12-30 NOTE — Plan of Care (Signed)

## 2021-12-30 NOTE — Progress Notes (Signed)
Progress Note  Patient Name: Ethan Mcneil Date of Encounter: 12/30/2021  Primary Cardiologist:   None   Subjective   Breathing OK and no pain.   Inpatient Medications    Scheduled Meds:  aspirin EC  81 mg Oral QHS   furosemide  40 mg Intravenous BID   heparin  5,000 Units Subcutaneous Q8H   hydrALAZINE  100 mg Oral TID   insulin aspart  0-15 Units Subcutaneous TID WC   levothyroxine  150 mcg Oral QAC breakfast   lipase/protease/amylase  24,000 Units Oral Daily   metoprolol succinate  50 mg Oral Daily   pantoprazole  40 mg Oral Daily   simvastatin  20 mg Oral q1800   sodium bicarbonate  650 mg Oral BID   Continuous Infusions:  PRN Meds: acetaminophen, nitroGLYCERIN, ondansetron (ZOFRAN) IV   Vital Signs    Vitals:   12/30/21 0054 12/30/21 0446 12/30/21 0739 12/30/21 1127  BP: (!) 178/66 (!) 183/61 (!) 174/68 (!) 185/69  Pulse: 68 63 66 67  Resp: 18 18 17 18   Temp: 97.6 F (36.4 C) (!) 97.5 F (36.4 C) 97.8 F (36.6 C) 97.6 F (36.4 C)  TempSrc: Oral Oral Oral Oral  SpO2: 97% 98% 97% 97%  Weight:  87 kg      Intake/Output Summary (Last 24 hours) at 12/30/2021 1157 Last data filed at 12/30/2021 1111 Gross per 24 hour  Intake 1546 ml  Output 4770 ml  Net -3224 ml   Filed Weights   12/29/21 0435 12/30/21 0446  Weight: 87.8 kg 87 kg    Telemetry    NSR - Personally Reviewed  ECG    NA - Personally Reviewed  Physical Exam   GEN: No  acute distress.   Neck: No  JVD Cardiac: RRR, no murmurs, rubs, or gallops.  Respiratory: Clear   to auscultation bilaterally. GI: Soft, nontender, non-distended, normal bowel sounds  MS:  Moderate lower leg bilateral edema; No deformity. Neuro:   Nonfocal  Psych: Oriented and appropriate   Labs    Chemistry Recent Labs  Lab 12/28/21 1647 12/29/21 0410 12/30/21 0353  NA 137 134* 138  K 4.4 4.5 4.3  CL 108 105 103  CO2 20* 20* 24  GLUCOSE 91 184* 189*  BUN 24* 24* 26*  CREATININE 2.38* 2.41* 2.71*   CALCIUM 8.3* 8.0* 8.4*  GFRNONAA 26* 26* 22*  ANIONGAP 9 9 11      Hematology Recent Labs  Lab 12/28/21 1647  WBC 8.0  RBC 3.43*  HGB 10.4*  HCT 32.1*  MCV 93.6  MCH 30.3  MCHC 32.4  RDW 16.2*  PLT 178    Cardiac EnzymesNo results for input(s): TROPONINI in the last 168 hours. No results for input(s): TROPIPOC in the last 168 hours.   BNP Recent Labs  Lab 12/28/21 1647  BNP 1,726.3*     DDimer No results for input(s): DDIMER in the last 168 hours.   Radiology    ECHOCARDIOGRAM COMPLETE  Result Date: 12/29/2021    ECHOCARDIOGRAM REPORT   Patient Name:   Ethan Mcneil Date of Exam: 12/29/2021 Medical Rec #:  [id number]       Height:       68.0 in Accession #:    [id number]      Weight:       193.6 lb Date of Birth:  March 17, 1934       BSA:          2.016 m Patient Age:  87 years        BP:           168/57 mmHg Patient Gender: M               HR:           59 bpm. Exam Location:  Inpatient Procedure: 2D Echo, Color Doppler and Cardiac Doppler Indications:    W43.15 Acute systolic (congestive) heart failure  History:        Patient has prior history of Echocardiogram examinations, most                 recent 05/27/2021. CAD; Risk Factors:Hypertension, Diabetes and                 Dyslipidemia. Prior performed at [facility].  Sonographer:    Raquel Sarna Senior RDCS Referring Phys: [id number] [place]  1. Left ventricular ejection fraction, by estimation, is 55%. The left ventricle has normal function. The left ventricle has no regional wall motion abnormalities. There is mild concentric left ventricular hypertrophy. Left ventricular diastolic parameters are consistent with Grade II diastolic dysfunction (pseudonormalization).  2. Right ventricular systolic function is normal. The right ventricular size is normal. There is mildly elevated pulmonary artery systolic pressure. The estimated right ventricular systolic pressure is 19.5 mmHg.  3. Left atrial size was mildly dilated.  4.  The mitral valve is abnormal. Mild to moderate mitral valve regurgitation. There is pulmonary vein blunting without reversal. Study may underestimate regurgitation severity (best seen long axis view). No evidence of mitral stenosis.  5. Tricuspid valve regurgitation is moderate to severe.  6. The aortic valve is tricuspid. Aortic valve regurgitation is not visualized.  7. Pulmonic valve regurgitation is moderate.  8. Mildly dilated pulmonary artery.  9. The inferior vena cava is normal in size with greater than 50% respiratory variability, suggesting right atrial pressure of 3 mmHg. Comparison(s): Compared to outside report, tricuspid and mitral valve regurgitation are worse. FINDINGS  Left Ventricle: Left ventricular ejection fraction, by estimation, is 55%. The left ventricle has normal function. The left ventricle has no regional wall motion abnormalities. The left ventricular internal cavity size was normal in size. There is mild concentric left ventricular hypertrophy. Left ventricular diastolic parameters are consistent with Grade II diastolic dysfunction (pseudonormalization). Right Ventricle: The right ventricular size is normal. No increase in right ventricular wall thickness. Right ventricular systolic function is normal. There is mildly elevated pulmonary artery systolic pressure. The tricuspid regurgitant velocity is 3.21  m/s, and with an assumed right atrial pressure of 3 mmHg, the estimated right ventricular systolic pressure is 09.3 mmHg. Left Atrium: Left atrial size was mildly dilated. Right Atrium: Right atrial size was normal in size. Pericardium: There is no evidence of pericardial effusion. Mitral Valve: The mitral valve is abnormal. Moderate mitral annular calcification. Mild to moderate mitral valve regurgitation. No evidence of mitral valve stenosis. MV peak gradient, 9.0 mmHg. The mean mitral valve gradient is 3.0 mmHg. Tricuspid Valve: The tricuspid valve is normal in structure. Tricuspid  valve regurgitation is moderate to severe. Aortic Valve: The aortic valve is tricuspid. Aortic valve regurgitation is not visualized. Pulmonic Valve: The pulmonic valve was normal in structure. Pulmonic valve regurgitation is moderate. No evidence of pulmonic stenosis. Aorta: The aortic root and ascending aorta are structurally normal, with no evidence of dilitation. Pulmonary Artery: The pulmonary artery is mildly dilated. Venous: The inferior vena cava is normal in size with greater than 50% respiratory variability, suggesting  right atrial pressure of 3 mmHg. IAS/Shunts: No atrial level shunt detected by color flow Doppler. Additional Comments: Moderate ascites is present.  LEFT VENTRICLE PLAX 2D LVIDd:         5.20 cm   Diastology LVIDs:         3.90 cm   LV e' medial:    6.68 cm/s LV PW:         1.30 cm   LV E/e' medial:  20.4 LV IVS:        1.20 cm   LV e' lateral:   7.93 cm/s LVOT diam:     2.40 cm   LV E/e' lateral: 17.2 LV SV:         106 LV SV Index:   53 LVOT Area:     4.52 cm  RIGHT VENTRICLE RV S prime:     8.55 cm/s TAPSE (M-mode): 1.8 cm LEFT ATRIUM             Index        RIGHT ATRIUM           Index LA diam:        4.40 cm 2.18 cm/m   RA Area:     17.30 cm LA Vol (A2C):   66.4 ml 32.94 ml/m  RA Volume:   43.80 ml  21.73 ml/m LA Vol (A4C):   80.2 ml 39.79 ml/m LA Biplane Vol: 74.7 ml 37.06 ml/m  AORTIC VALVE LVOT Vmax:   102.00 cm/s LVOT Vmean:  69.100 cm/s LVOT VTI:    0.235 m  AORTA Ao Root diam: 3.90 cm Ao Asc diam:  3.50 cm MITRAL VALVE                TRICUSPID VALVE MV Area (PHT): 3.46 cm     TR Peak grad:   41.2 mmHg MV Area VTI:   2.10 cm     TR Vmax:        321.00 cm/s MV Peak grad:  9.0 mmHg MV Mean grad:  3.0 mmHg     SHUNTS MV Vmax:       1.50 m/s     Systemic VTI:  0.24 m MV Vmean:      89.5 cm/s    Systemic Diam: 2.40 cm MV Decel Time: 219 msec MV E velocity: 136.00 cm/s MV A velocity: 99.20 cm/s MV E/A ratio:  1.37 Rudean Haskell MD Electronically signed by Rudean Haskell MD Signature Date/Time: 12/29/2021/4:22:29 PM    Final     Cardiac Studies   Echo:    1. Left ventricular ejection fraction, by estimation, is 55%. The left  ventricle has normal function. The left ventricle has no regional wall  motion abnormalities. There is mild concentric left ventricular  hypertrophy. Left ventricular diastolic  parameters are consistent with Grade II diastolic dysfunction  (pseudonormalization).   2. Right ventricular systolic function is normal. The right ventricular  size is normal. There is mildly elevated pulmonary artery systolic  pressure. The estimated right ventricular systolic pressure is 90.2 mmHg.   3. Left atrial size was mildly dilated.   4. The mitral valve is abnormal. Mild to moderate mitral valve  regurgitation. There is pulmonary vein blunting without reversal. Study  may underestimate regurgitation severity (best seen long axis view). No  evidence of mitral stenosis.   5. Tricuspid valve regurgitation is moderate to severe.   6. The aortic valve is tricuspid. Aortic valve regurgitation is not  visualized.   7.  Pulmonic valve regurgitation is moderate.   8. Mildly dilated pulmonary artery.   9. The inferior vena cava is normal in size with greater than 50%  respiratory variability, suggesting right atrial pressure of 3 mmHg.   Patient Profile     88 y.o. male with a history of CAD/CABG, CKD.  He presented with anasarca.    Assessment & Plan    Acute diastolic heart failure ([place]):   EF above 55%.  Moderate MR.  Mildly elevated pulmonary pressure.    Net negative 6640 cc.   BNP still elevated.  Creat is actually down.  Continue current diuresis at current dose.    Applied  knee high compression stockings.    CAD (coronary artery disease):  No plan for invasive work up.  He has not had unstable symptoms.      HTN (hypertension):    BPs are elevated .     I am going to add Imdur to his nitrates and add a low dose of Norvasc.     Diabetes ([place]):   Continue SSI.  Continue    Chronic kidney disease, stage IV (severe) ([place]):     Creat is stable and tolerating current diuresis.      For questions or updates, please contact [place] Please consult [email or web address] for contact info under Cardiology/STEMI.   Signed, Minus Breeding, MD  12/30/2021, 11:57 AM

## 2021-12-31 DIAGNOSIS — I5043 Acute on chronic combined systolic (congestive) and diastolic (congestive) heart failure: Secondary | ICD-10-CM | POA: Diagnosis not present

## 2021-12-31 LAB — GLUCOSE, CAPILLARY
Glucose-Capillary: 268 mg/dL — ABNORMAL HIGH (ref 70–99)
Glucose-Capillary: 165 mg/dL — ABNORMAL HIGH (ref 70–99)
Glucose-Capillary: 301 mg/dL — ABNORMAL HIGH (ref 70–99)
Glucose-Capillary: 230 mg/dL — ABNORMAL HIGH (ref 70–99)

## 2021-12-31 LAB — BASIC METABOLIC PANEL
Anion gap: 8 (ref 5–15)
BUN: 30 mg/dL — ABNORMAL HIGH (ref 8–23)
CO2: 26 mmol/L (ref 22–32)
Calcium: 8.4 mg/dL — ABNORMAL LOW (ref 8.9–10.3)
Chloride: 100 mmol/L (ref 98–111)
Creatinine, Ser: 2.88 mg/dL — ABNORMAL HIGH (ref 0.61–1.24)
GFR, Estimated: 21 mL/min — ABNORMAL LOW (ref >60)
Glucose, Bld: 291 mg/dL — ABNORMAL HIGH (ref 70–99)
Potassium: 4.2 mmol/L (ref 3.5–5.1)
Sodium: 134 mmol/L — ABNORMAL LOW (ref 135–145)

## 2021-12-31 MED ORDER — ISOSORBIDE MONONITRATE ER 60 MG PO TB24
60.0000 mg | ORAL_TABLET | Freq: Every day | ORAL | Status: DC
Start: 2022-01-01 — End: 2022-01-01
  Administered 2022-01-01: 60 mg via ORAL
  Filled 2021-12-31: qty 1

## 2021-12-31 NOTE — Progress Notes (Signed)
Mobility Specialist Progress Note   12/31/21 1050  Mobility  Activity Ambulated in hall  Level of Assistance Standby assist, set-up cues, supervision of patient - no hands on  Assistive Device None  Distance Ambulated (ft) 280 ft  Mobility Ambulated independently in hallway  Mobility Response Tolerated well  Mobility performed by Mobility specialist  $Mobility charge 1 Mobility   Received pt in bed w/o complaint and agreeable. Ambulating on RA w/ SpO2 ranging from 87% - 91%, otherwise asymptomatic throughout. Returned back to bed w/ call bell in reach and all needs met.  Pre Mobility: 65 HR, 93% SpO2 During Mobility: 78 HR, 87% SpO2 Post Mobility: 59 HR, 88% SpO2  Holland Falling Mobility Specialist Phone Number [phone]

## 2021-12-31 NOTE — Progress Notes (Addendum)
Progress Note  Patient Name: Ethan Mcneil Date of Encounter: 12/31/2021  [facility] Cardiologist: Dr. Marlou Porch  Subjective   Denies any CP or SOB. Has been ambulating in the hallway daily.   Inpatient Medications    Scheduled Meds:  amLODipine  2.5 mg Oral Daily   aspirin EC  81 mg Oral QHS   furosemide  40 mg Intravenous BID   heparin  5,000 Units Subcutaneous Q8H   hydrALAZINE  100 mg Oral TID   insulin aspart  0-15 Units Subcutaneous TID WC   isosorbide mononitrate  30 mg Oral Daily   levothyroxine  150 mcg Oral QAC breakfast   lipase/protease/amylase  24,000 Units Oral Daily   metoprolol succinate  50 mg Oral Daily   pantoprazole  40 mg Oral Daily   simvastatin  20 mg Oral q1800   sodium bicarbonate  650 mg Oral BID   Continuous Infusions:  PRN Meds: acetaminophen, nitroGLYCERIN, ondansetron (ZOFRAN) IV   Vital Signs    Vitals:   12/30/21 1127 12/30/21 1609 12/30/21 2025 12/31/21 0443  BP: (!) 185/69 (!) 176/62 (!) 171/61 (!) 175/63  Pulse: 67 62 65 (!) 59  Resp: 18 17 17 17   Temp: 97.6 F (36.4 C) 97.9 F (36.6 C) 98 F (36.7 C) 97.8 F (36.6 C)  TempSrc: Oral Oral Oral Oral  SpO2: 97% 98% 97% 96%  Weight:    86.6 kg    Intake/Output Summary (Last 24 hours) at 12/31/2021 0910 Last data filed at 12/31/2021 0902 Gross per 24 hour  Intake 1860 ml  Output 5225 ml  Net -3365 ml   Last 3 Weights 12/31/2021 12/30/2021 12/29/2021  Weight (lbs) 190 lb 14.7 oz 191 lb 12.8 oz 193 lb 9 oz  Weight (kg) 86.6 kg 87 kg 87.8 kg      Telemetry    NSR with HR 50s - Personally Reviewed  ECG    NSR without significant ST-T wave changes - Personally Reviewed  Physical Exam   GEN: No acute distress.   Neck: No JVD Cardiac: RRR, no murmurs, rubs, or gallops.  Respiratory: Clear to auscultation bilaterally. GI: Soft, nontender, non-distended  MS: 1+ pitting edema; No deformity. Neuro:  Nonfocal  Psych: Normal affect   Labs    High Sensitivity  Troponin:  No results for input(s): TROPONINIHS in the last 720 hours.   Chemistry Recent Labs  Lab 12/29/21 0410 12/30/21 0353 12/31/21 0346  NA 134* 138 134*  K 4.5 4.3 4.2  CL 105 103 100  CO2 20* 24 26  GLUCOSE 184* 189* 291*  BUN 24* 26* 30*  CREATININE 2.41* 2.71* 2.88*  CALCIUM 8.0* 8.4* 8.4*  GFRNONAA 26* 22* 21*  ANIONGAP 9 11 8     Lipids No results for input(s): CHOL, TRIG, HDL, LABVLDL, LDLCALC, CHOLHDL in the last 168 hours.  Hematology Recent Labs  Lab 12/28/21 1647  WBC 8.0  RBC 3.43*  HGB 10.4*  HCT 32.1*  MCV 93.6  MCH 30.3  MCHC 32.4  RDW 16.2*  PLT 178   Thyroid  Recent Labs  Lab 12/27/21 1026  TSH 2.20    BNP Recent Labs  Lab 12/28/21 1647  BNP 1,726.3*    DDimer No results for input(s): DDIMER in the last 168 hours.   Radiology    ECHOCARDIOGRAM COMPLETE  Result Date: 12/29/2021    ECHOCARDIOGRAM REPORT   Patient Name:   Ethan Mcneil Date of Exam: 12/29/2021 Medical Rec #:  [id number]  Height:       68.0 in Accession #:    [id number]      Weight:       193.6 lb Date of Birth:  [birth year]       BSA:          2.016 m Patient Age:    88 years        BP:           168/57 mmHg Patient Gender: M               HR:           59 bpm. Exam Location:  Inpatient Procedure: 2D Echo, Color Doppler and Cardiac Doppler Indications:    E56.31 Acute systolic (congestive) heart failure  History:        Patient has prior history of Echocardiogram examinations, most                 recent 05/27/2021. CAD; Risk Factors:Hypertension, Diabetes and                 Dyslipidemia. Prior performed at [facility].  Sonographer:    Raquel Sarna Senior RDCS Referring Phys: [id number] [place]  1. Left ventricular ejection fraction, by estimation, is 55%. The left ventricle has normal function. The left ventricle has no regional wall motion abnormalities. There is mild concentric left ventricular hypertrophy. Left ventricular diastolic parameters are consistent with  Grade II diastolic dysfunction (pseudonormalization).  2. Right ventricular systolic function is normal. The right ventricular size is normal. There is mildly elevated pulmonary artery systolic pressure. The estimated right ventricular systolic pressure is 78.5 mmHg.  3. Left atrial size was mildly dilated.  4. The mitral valve is abnormal. Mild to moderate mitral valve regurgitation. There is pulmonary vein blunting without reversal. Study may underestimate regurgitation severity (best seen long axis view). No evidence of mitral stenosis.  5. Tricuspid valve regurgitation is moderate to severe.  6. The aortic valve is tricuspid. Aortic valve regurgitation is not visualized.  7. Pulmonic valve regurgitation is moderate.  8. Mildly dilated pulmonary artery.  9. The inferior vena cava is normal in size with greater than 50% respiratory variability, suggesting right atrial pressure of 3 mmHg. Comparison(s): Compared to outside report, tricuspid and mitral valve regurgitation are worse. FINDINGS  Left Ventricle: Left ventricular ejection fraction, by estimation, is 55%. The left ventricle has normal function. The left ventricle has no regional wall motion abnormalities. The left ventricular internal cavity size was normal in size. There is mild concentric left ventricular hypertrophy. Left ventricular diastolic parameters are consistent with Grade II diastolic dysfunction (pseudonormalization). Right Ventricle: The right ventricular size is normal. No increase in right ventricular wall thickness. Right ventricular systolic function is normal. There is mildly elevated pulmonary artery systolic pressure. The tricuspid regurgitant velocity is 3.21  m/s, and with an assumed right atrial pressure of 3 mmHg, the estimated right ventricular systolic pressure is 88.5 mmHg. Left Atrium: Left atrial size was mildly dilated. Right Atrium: Right atrial size was normal in size. Pericardium: There is no evidence of pericardial  effusion. Mitral Valve: The mitral valve is abnormal. Moderate mitral annular calcification. Mild to moderate mitral valve regurgitation. No evidence of mitral valve stenosis. MV peak gradient, 9.0 mmHg. The mean mitral valve gradient is 3.0 mmHg. Tricuspid Valve: The tricuspid valve is normal in structure. Tricuspid valve regurgitation is moderate to severe. Aortic Valve: The aortic valve is tricuspid. Aortic valve regurgitation is not visualized. Pulmonic  Valve: The pulmonic valve was normal in structure. Pulmonic valve regurgitation is moderate. No evidence of pulmonic stenosis. Aorta: The aortic root and ascending aorta are structurally normal, with no evidence of dilitation. Pulmonary Artery: The pulmonary artery is mildly dilated. Venous: The inferior vena cava is normal in size with greater than 50% respiratory variability, suggesting right atrial pressure of 3 mmHg. IAS/Shunts: No atrial level shunt detected by color flow Doppler. Additional Comments: Moderate ascites is present.  LEFT VENTRICLE PLAX 2D LVIDd:         5.20 cm   Diastology LVIDs:         3.90 cm   LV e' medial:    6.68 cm/s LV PW:         1.30 cm   LV E/e' medial:  20.4 LV IVS:        1.20 cm   LV e' lateral:   7.93 cm/s LVOT diam:     2.40 cm   LV E/e' lateral: 17.2 LV SV:         106 LV SV Index:   53 LVOT Area:     4.52 cm  RIGHT VENTRICLE RV S prime:     8.55 cm/s TAPSE (M-mode): 1.8 cm LEFT ATRIUM             Index        RIGHT ATRIUM           Index LA diam:        4.40 cm 2.18 cm/m   RA Area:     17.30 cm LA Vol (A2C):   66.4 ml 32.94 ml/m  RA Volume:   43.80 ml  21.73 ml/m LA Vol (A4C):   80.2 ml 39.79 ml/m LA Biplane Vol: 74.7 ml 37.06 ml/m  AORTIC VALVE LVOT Vmax:   102.00 cm/s LVOT Vmean:  69.100 cm/s LVOT VTI:    0.235 m  AORTA Ao Root diam: 3.90 cm Ao Asc diam:  3.50 cm MITRAL VALVE                TRICUSPID VALVE MV Area (PHT): 3.46 cm     TR Peak grad:   41.2 mmHg MV Area VTI:   2.10 cm     TR Vmax:        321.00 cm/s  MV Peak grad:  9.0 mmHg MV Mean grad:  3.0 mmHg     SHUNTS MV Vmax:       1.50 m/s     Systemic VTI:  0.24 m MV Vmean:      89.5 cm/s    Systemic Diam: 2.40 cm MV Decel Time: 219 msec MV E velocity: 136.00 cm/s MV A velocity: 99.20 cm/s MV E/A ratio:  1.37 Rudean Haskell MD Electronically signed by Rudean Haskell MD Signature Date/Time: 12/29/2021/4:22:29 PM    Final     Cardiac Studies   Echo 12/29/2021 1. Left ventricular ejection fraction, by estimation, is 55%. The left  ventricle has normal function. The left ventricle has no regional wall  motion abnormalities. There is mild concentric left ventricular  hypertrophy. Left ventricular diastolic  parameters are consistent with Grade II diastolic dysfunction  (pseudonormalization).   2. Right ventricular systolic function is normal. The right ventricular  size is normal. There is mildly elevated pulmonary artery systolic  pressure. The estimated right ventricular systolic pressure is 11.9 mmHg.   3. Left atrial size was mildly dilated.   4. The mitral valve is abnormal. Mild to moderate mitral  valve  regurgitation. There is pulmonary vein blunting without reversal. Study  may underestimate regurgitation severity (best seen long axis view). No  evidence of mitral stenosis.   5. Tricuspid valve regurgitation is moderate to severe.   6. The aortic valve is tricuspid. Aortic valve regurgitation is not  visualized.   7. Pulmonic valve regurgitation is moderate.   8. Mildly dilated pulmonary artery.   9. The inferior vena cava is normal in size with greater than 50%  respiratory variability, suggesting right atrial pressure of 3 mmHg.   Comparison(s): Compared to outside report, tricuspid and mitral valve  regurgitation are worse.  Patient Profile     88 y.o. male with PMH of CAD s/p CABG 1997, CKD stage IV, HTN, HLD, DM II, and PVD presented with anasarca.   Assessment & Plan    1. Acute diastolic CHF  - Echo 8/67/6720 EF  55%, grade 2 DD, RVSP 44.2 mmHg, mild to moderate MR, moderate to severe TR  - underwent IV diuresis, I/O -9.1 L. 193.56 lbs --> 190.92 lbs.   - Cr 2.79 --> 2.38 --> 2.41 --> 2.71 --> 2.88  - 1+ pitting edema left in the lower leg, hesitant to continue with IV diuresis, consider switch to 40mg  PO BID (previously on 40mg  daily at home)  - ambulate today, likely can be discharged tomorrow if renal function remain stable. Continue knee high compression.   2. CAD s/p CABG 1997: denies any chest pain.   3. HTN: persistently elevated BP, Imudr added yesterday, SBP down from 170 to 144 this morning. Will increase imdur to 60mg  daily.   4. HLD  5. DM II  6. CKD stage IV: per patient, has outpatient follow up with his nephrologist in early Feb   For questions or updates, please contact [place] Please consult [email or web address] for contact info under        Signed, Almyra Deforest, [place]  12/31/2021, 9:10 AM    Patient seen and examined   I agree with findings as noted by Janan Ridge above  Pt comfortable laying flat On exam: JVP is normal Lungs are CTA Cardiac RRR  N oS3   Ext with triv edema  Pt has diuresed     With bump in Creatinine will observe overnight   Got am lasix IV   this AM   Hold PM   Assess labs in am   Pt follows in renal clinic     Dorris Carnes MD

## 2021-12-31 NOTE — Progress Notes (Signed)
Inpatient Diabetes Program Recommendations  AACE/ADA: New Consensus Statement on Inpatient Glycemic Control (2015)  Target Ranges:  Prepandial:   less than 140 mg/dL      Peak postprandial:   less than 180 mg/dL (1-2 hours)      Critically ill patients:  140 - 180 mg/dL   Lab Results  Component Value Date   GLUCAP 268 (H) 12/31/2021   HGBA1C 6.4 (A) 12/21/2021    Review of Glycemic Control  Latest Reference Range & Units 12/29/21 16:54 12/29/21 20:51 12/30/21 06:09 12/30/21 11:23 12/30/21 16:05 12/30/21 20:52 12/31/21 06:04  Glucose-Capillary 70 - 99 mg/dL 219 (H) 206 (H) 173 (H) 157 (H) 166 (H) 337 (H) 268 (H)   Diabetes history: DM 2 Outpatient Diabetes medications:  Novolog flexpen 6 units tid with meals, Basaglar 7 units q HS Current orders for Inpatient glycemic control:  Novolog moderate tid with meals  Inpatient Diabetes Program Recommendations:    Consider adding Semglee 6 units daily.   Thanks,  Adah Perl, RN, BC-ADM Inpatient Diabetes Coordinator Pager [phone]  (8a-5p)

## 2021-12-31 NOTE — Care Management Important Message (Signed)
Important Message  Patient Details  Name: Ethan Mcneil MRN: [id number] Date of Birth: 1934-06-09   Medicare Important Message Given:  Yes     Memory Argue 12/31/2021, 2:50 PM

## 2022-01-01 ENCOUNTER — Other Ambulatory Visit: Payer: Self-pay | Admitting: Cardiology

## 2022-01-01 ENCOUNTER — Other Ambulatory Visit (HOSPITAL_COMMUNITY): Payer: Self-pay

## 2022-01-01 DIAGNOSIS — I5043 Acute on chronic combined systolic (congestive) and diastolic (congestive) heart failure: Secondary | ICD-10-CM | POA: Diagnosis not present

## 2022-01-01 DIAGNOSIS — I5041 Acute combined systolic (congestive) and diastolic (congestive) heart failure: Secondary | ICD-10-CM

## 2022-01-01 LAB — BASIC METABOLIC PANEL
Anion gap: 13 (ref 5–15)
BUN: 37 mg/dL — ABNORMAL HIGH (ref 8–23)
CO2: 27 mmol/L (ref 22–32)
Calcium: 8.5 mg/dL — ABNORMAL LOW (ref 8.9–10.3)
Chloride: 97 mmol/L — ABNORMAL LOW (ref 98–111)
Creatinine, Ser: 2.88 mg/dL — ABNORMAL HIGH (ref 0.61–1.24)
GFR, Estimated: 21 mL/min — ABNORMAL LOW (ref >60)
Glucose, Bld: 276 mg/dL — ABNORMAL HIGH (ref 70–99)
Potassium: 4.2 mmol/L (ref 3.5–5.1)
Sodium: 137 mmol/L (ref 135–145)

## 2022-01-01 LAB — GLUCOSE, CAPILLARY
Glucose-Capillary: 252 mg/dL — ABNORMAL HIGH (ref 70–99)
Glucose-Capillary: 222 mg/dL — ABNORMAL HIGH (ref 70–99)

## 2022-01-01 MED ORDER — NITROGLYCERIN 0.4 MG SL SUBL
0.4000 mg | SUBLINGUAL_TABLET | SUBLINGUAL | 2 refills | Status: DC | PRN
  Filled 2022-01-01: qty 25, 8d supply, fill #0

## 2022-01-01 MED ORDER — INSULIN GLARGINE-YFGN 100 UNIT/ML ~~LOC~~ SOLN
6.0000 [IU] | Freq: Every day | SUBCUTANEOUS | Status: DC
  Administered 2022-01-01: 6 [IU] via SUBCUTANEOUS
  Filled 2022-01-01: qty 0.06

## 2022-01-01 MED ORDER — AMLODIPINE BESYLATE 5 MG PO TABS
5.0000 mg | ORAL_TABLET | Freq: Every day | ORAL | 2 refills | Status: DC
  Filled 2022-01-01: qty 90, 90d supply, fill #0

## 2022-01-01 NOTE — Progress Notes (Signed)
Progress Note  Patient Name: Ethan Mcneil Date of Encounter: 01/01/2022  [facility] Cardiologist: Dr. Marlou Porch  Subjective    Ready to go home   NO CP  No SOB    Inpatient Medications    Scheduled Meds:  amLODipine  2.5 mg Oral Daily   aspirin EC  81 mg Oral QHS   heparin  5,000 Units Subcutaneous Q8H   hydrALAZINE  100 mg Oral TID   insulin aspart  0-15 Units Subcutaneous TID WC   isosorbide mononitrate  60 mg Oral Daily   levothyroxine  150 mcg Oral QAC breakfast   lipase/protease/amylase  24,000 Units Oral Daily   metoprolol succinate  50 mg Oral Daily   pantoprazole  40 mg Oral Daily   simvastatin  20 mg Oral q1800   sodium bicarbonate  650 mg Oral BID   Continuous Infusions:  PRN Meds: acetaminophen, nitroGLYCERIN, ondansetron (ZOFRAN) IV   Vital Signs    Vitals:   12/31/21 1945 12/31/21 2300 01/01/22 0541 01/01/22 0835  BP: (!) 164/59  (!) 178/59 (!) 162/62  Pulse: 64 68 (!) 58 66  Resp: 18  18 18   Temp: 98.1 F (36.7 C)  98.1 F (36.7 C)   TempSrc: Oral  Oral   SpO2: 91% 90% 98% 96%  Weight:   69.9 kg     Intake/Output Summary (Last 24 hours) at 01/01/2022 0914 Last data filed at 01/01/2022 0845 Gross per 24 hour  Intake 1192 ml  Output 1945 ml  Net -753 ml   Net 9.8 L negative   Last 3 Weights 01/01/2022 12/31/2021 12/30/2021  Weight (lbs) 154 lb 1.6 oz 190 lb 14.7 oz 191 lb 12.8 oz  Weight (kg) 69.899 kg 86.6 kg 87 kg      Telemetry    SR- Personally Reviewed  ECG  No new  - Personally Reviewed  Physical Exam   GEN: No acute distress.   Neck: JVP is normal   Cardiac: RRR, no murmurs, Respiratory: Clear to auscultation bilaterally. GI: Soft, nontender, non-distended  MS: No LE edema; No deformity. Neuro:  Nonfocal  Psych: Normal affect   Labs    High Sensitivity Troponin:  No results for input(s): TROPONINIHS in the last 720 hours.   Chemistry Recent Labs  Lab 12/30/21 0353 12/31/21 0346 01/01/22 0431  NA 138 134* 137   K 4.3 4.2 4.2  CL 103 100 97*  CO2 24 26 27   GLUCOSE 189* 291* 276*  BUN 26* 30* 37*  CREATININE 2.71* 2.88* 2.88*  CALCIUM 8.4* 8.4* 8.5*  GFRNONAA 22* 21* 21*  ANIONGAP 11 8 13     Lipids No results for input(s): CHOL, TRIG, HDL, LABVLDL, LDLCALC, CHOLHDL in the last 168 hours.  Hematology Recent Labs  Lab 12/28/21 1647  WBC 8.0  RBC 3.43*  HGB 10.4*  HCT 32.1*  MCV 93.6  MCH 30.3  MCHC 32.4  RDW 16.2*  PLT 178   Thyroid  Recent Labs  Lab 12/27/21 1026  TSH 2.20    BNP Recent Labs  Lab 12/28/21 1647  BNP 1,726.3*    DDimer No results for input(s): DDIMER in the last 168 hours.   Radiology    No results found.  Cardiac Studies   Echo 12/29/2021 1. Left ventricular ejection fraction, by estimation, is 55%. The left  ventricle has normal function. The left ventricle has no regional wall  motion abnormalities. There is mild concentric left ventricular  hypertrophy. Left ventricular diastolic  parameters are  consistent with Grade II diastolic dysfunction  (pseudonormalization).   2. Right ventricular systolic function is normal. The right ventricular  size is normal. There is mildly elevated pulmonary artery systolic  pressure. The estimated right ventricular systolic pressure is 27.0 mmHg.   3. Left atrial size was mildly dilated.   4. The mitral valve is abnormal. Mild to moderate mitral valve  regurgitation. There is pulmonary vein blunting without reversal. Study  may underestimate regurgitation severity (best seen long axis view). No  evidence of mitral stenosis.   5. Tricuspid valve regurgitation is moderate to severe.   6. The aortic valve is tricuspid. Aortic valve regurgitation is not  visualized.   7. Pulmonic valve regurgitation is moderate.   8. Mildly dilated pulmonary artery.   9. The inferior vena cava is normal in size with greater than 50%  respiratory variability, suggesting right atrial pressure of 3 mmHg.   Comparison(s): Compared  to outside report, tricuspid and mitral valve  regurgitation are worse.  Patient Profile     88 y.o. male with PMH of CAD s/p CABG 1997, CKD stage IV, HTN, HLD, DM II, and PVD presented with anasarca.   Assessment & Plan    1. Acute diastolic CHF  - Echo 7/86/7544 EF 55%, grade 2 DD, RVSP 44.2 mmHg, mild to moderate MR, moderate to severe TR  - underwent IV diuresis, I/O -9.1 L. 193.56 lbs --> 190.92 lbs.   - Cr 2.79 --> 2.38 --> 2.41 --> 2.71 --> 2.88   2.88 Volume status looks OK    He may be a little prerenal Hold lasix today    Start 40 mg daily tomorrow Daily wts in AM BMET and BNP on Monday    Close follow up in cardiology and in renal clinic .   2. CAD s/p CABG 1997:  Asymptomatic      3. HTN:BP is still high  Stop imdur   Increase amlodipine to 5 mg   Follow at home    4. HLD  LDL 43  HDL 49   ( Oct 2022)    5. DM II  6. CKD stage IV:  Cr remains at 2.88  today  hold lasix   40 mg daily tomorrow   Labs on Monday    He does follow with [facility]   Has appt early February     For questions or updates, please contact [place] Please consult [email or web address] for contact info under        Signed, Dorris Carnes, MD  01/01/2022, 9:14 AM

## 2022-01-01 NOTE — TOC Transition Note (Signed)
Transition of Care [facility]) - CM/SW Discharge Note   Patient Details  Name: RACHID PARHAM MRN: [id number] Date of Birth: Apr 04, 1934  Transition of Care [facility]) CM/SW Contact:  Zenon Mayo, RN Phone Number: 01/01/2022, 12:19 PM   Clinical Narrative:    Patient is for dc today, He is from home alone, indep, son will transport home at discharge.  If he needs oxygen he does not have a preference of the DME agency. He states he does not need any HH services.       Final next level of care: Home/Self Care Barriers to Discharge: No Barriers Identified   Patient Goals and CMS Choice Patient states their goals for this hospitalization and ongoing recovery are:: return home   Choice offered to / list presented to : NA  Discharge Placement                       Discharge Plan and Services                  DME Agency: NA       HH Arranged: NA          Social Determinants of Health (SDOH) Interventions     Readmission Risk Interventions No flowsheet data found.

## 2022-01-01 NOTE — Progress Notes (Signed)
Pt had significant decrease in weight this morning (86.6kg to 69.9kg) both weights were charted as standing. This RN verified the charted weight of 69.6kg this morning.

## 2022-01-01 NOTE — TOC Progression Note (Addendum)
Transition of Care [facility]) - Progression Note    Patient Details  Name: Ethan Mcneil MRN: [id number] Date of Birth: 1934-11-25  Transition of Care [facility]) CM/SW Contact  Zenon Mayo, RN Phone Number: 01/01/2022, 10:23 AM  Clinical Narrative:     Transition of Care [facility]) Screening Note   Patient Details  Name: Ethan Mcneil Date of Birth: 28-Oct-1934   Transition of Care [facility]) CM/SW Contact:    Zenon Mayo, RN Phone Number: 01/01/2022, 10:23 AM    Transition of Care Department [facility]) has reviewed patient and no TOC needs have been identified at this time. We will continue to monitor patient advancement through interdisciplinary progression rounds. If new patient transition needs arise, please place a TOC consult.  He is from home alone, indep, son will transport home at discharge.  If he needs oxygen he does not have a preference of the DME agency.          Expected Discharge Plan and Services                                                 Social Determinants of Health (SDOH) Interventions    Readmission Risk Interventions No flowsheet data found.

## 2022-01-01 NOTE — Telephone Encounter (Signed)
Patient saw DOD on 12/28/21 and was admitted to hospital. Patient has been discharge and will be following up with Dr. Percival Spanish.

## 2022-01-01 NOTE — Discharge Summary (Signed)
Discharge Summary    Patient ID: AXLE PARFAIT MRN: [id number]; DOB: Oct 21, 1934  Admit date: 12/28/2021 Discharge date: 01/01/2022  PCP:  Colon Branch, MD   [facility] Providers Cardiologist:  Minus Breeding, MD        Discharge Diagnoses    Principal Problem:   Acute on chronic combined systolic and diastolic CHF (congestive heart failure) ([place])    Diagnostic Studies/Procedures    Echo 12/29/2021 1. Left ventricular ejection fraction, by estimation, is 55%. The left  ventricle has normal function. The left ventricle has no regional wall  motion abnormalities. There is mild concentric left ventricular  hypertrophy. Left ventricular diastolic  parameters are consistent with Grade II diastolic dysfunction  (pseudonormalization).   2. Right ventricular systolic function is normal. The right ventricular  size is normal. There is mildly elevated pulmonary artery systolic  pressure. The estimated right ventricular systolic pressure is 67.5 mmHg.   3. Left atrial size was mildly dilated.   4. The mitral valve is abnormal. Mild to moderate mitral valve  regurgitation. There is pulmonary vein blunting without reversal. Study  may underestimate regurgitation severity (best seen long axis view). No  evidence of mitral stenosis.   5. Tricuspid valve regurgitation is moderate to severe.   6. The aortic valve is tricuspid. Aortic valve regurgitation is not  visualized.   7. Pulmonic valve regurgitation is moderate.   8. Mildly dilated pulmonary artery.   9. The inferior vena cava is normal in size with greater than 50%  respiratory variability, suggesting right atrial pressure of 3 mmHg.   Comparison(s): Compared to outside report, tricuspid and mitral valve  regurgitation are worse. _____________   History of Present Illness     Ethan Mcneil is a 88 y.o. male with PMH of CAD s/p CABG 1997, CKD stage IV, HTN, HLD, DM II, and PVD presented with anasarca.   Ethan Mcneil  is a 88 y.o. male patient of Dr. Johnsie Cancel and Dr. Larose Kells who presented on 1/13 to the cardiology office for the evaluation of heart failure.  On 1/12, Dr. Larose Kells called the cardiology office asking for him to be seen urgently secondary to Heart failure.  In review of Dr. Ethel Rana office note note from 12/27/2021, patient was having shortness of breath and leg swelling. Patient had been to the ER on 12/28 and 1/7.  On 1/7, patient was also complaining of scrotal swelling. During that ED visit, his BNP was 1900 (Similar to that in December), Creatinine 2.5, potassium 5.2, hemoglobin 10.9.  He received 40 mg of IV Lasix in the ED. The IV Lasix made him feel better temporarily.  He is not weighing himself daily.  Edema is still present and quite prolific. Dr. Larose Kells increased his Lasix from 20 mg every other day to 40 mg a day.  He has not urinated significantly since then.  His weight may have gone up he states.   Has a history of CAD with CABG in 1997 and a prior cardiac cath in 2009 that showed patent grafts.  Has chronic kidney disease as well.  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   While in the office with Dr. Marlou Porch on 11/13, patient described fairly massive edema that had been accumulating over the last few months.  His NYHA class is 2-3.  Denied any syncope or bleeding.  His swelling was mildly improved in the morning hours after he woke up. He denied any resting chest discomfort.  Reported that sometimes with activity he may feel mild chest discomfort.   Lab work on 1/12 showed BUN of 28, creatinine of 2.79, potassium 4.3, GFR of 20. Previously in November 2022 his creatinine was 2.9  Hospital Course     Consultants: None    1. Acute diastolic CHF - Echo 1/54/0086 EF 55%, grade 2 DD, RVSP 44.2 mmHg, mild to moderate MR, moderate to severe TR - underwent IV diuresis, I/O -9.1 L. 193.56 lbs --> 190.92 lbs.  - Cr 2.79 --> 2.38 --> 2.41 --> 2.71 --> 2.88   2.88 - Volume status  looks OK .   He may be a little prerenal - Hold lasix today    - Start 40 mg PO daily tomorrow - Daily wts in AM - BMET and BNP on Monday at follow up appointment  - Close follow up in cardiology and in renal clinic .   2. CAD s/p CABG 1997:  Asymptomatic       3. HTN: BP is still high  - Stop imdur    - Increase amlodipine to 5 mg    - Follow at home     4. HLD  LDL 43  HDL 49   ( Oct 2022)     5. DM II   6. CKD stage IV:  Cr remains at 2.88  today - hold lasix today, start 40 mg daily tomorrow   - Labs on Monday    - He does follow with [facility]   Has appt early February     Patient was seen and examined by Dr. Harrington Challenger and deemed stable for discharge. All cardiology follow up has been arranged     Did the patient have an acute coronary syndrome (MI, NSTEMI, STEMI, etc) this admission?:  No                               Did the patient have a percutaneous coronary intervention (stent / angioplasty)?:  No.        The patient will be scheduled for a TOC follow up appointment in 5-7 days.  A message has been sent to the [facility] and Scheduling Pool at the office where the patient should be seen for follow up.  _____________  Discharge Vitals Blood pressure (!) 162/62, pulse 66, temperature 98.1 F (36.7 C), temperature source Oral, resp. rate 18, weight 69.9 kg, SpO2 96 %.  Filed Weights   12/30/21 0446 12/31/21 0443 01/01/22 0541  Weight: 87 kg 86.6 kg 69.9 kg    Labs & Radiologic Studies    CBC No results for input(s): WBC, NEUTROABS, HGB, HCT, MCV, PLT in the last 72 hours. Basic Metabolic Panel Recent Labs    12/31/21 0346 01/01/22 0431  NA 134* 137  K 4.2 4.2  CL 100 97*  CO2 26 27  GLUCOSE 291* 276*  BUN 30* 37*  CREATININE 2.88* 2.88*  CALCIUM 8.4* 8.5*   Liver Function Tests No results for input(s): AST, ALT, ALKPHOS, BILITOT, PROT, ALBUMIN in the last 72 hours. No results for input(s): LIPASE, AMYLASE in the last 72 hours. High  Sensitivity Troponin:   No results for input(s): TROPONINIHS in the last 720 hours.  BNP Invalid input(s): POCBNP D-Dimer No results for input(s): DDIMER in the last 72 hours. Hemoglobin A1C No results for input(s): HGBA1C in the last 72 hours. Fasting Lipid Panel No results for input(s): CHOL, HDL, LDLCALC, TRIG,  CHOLHDL, LDLDIRECT in the last 72 hours. Thyroid Function Tests No results for input(s): TSH, T4TOTAL, T3FREE, THYROIDAB in the last 72 hours.  Invalid input(s): FREET3 _____________  DG Chest 2 View  Result Date: 12/27/2021 CLINICAL DATA:  88 year old male with a history of CHF EXAM: CHEST - 2 VIEW COMPARISON:  05/26/2021 FINDINGS: Cardiomediastinal silhouette unchanged in size and contour. Surgical changes of median sternotomy and CABG. Similar appearance of patchy opacity in the right mid lung with new bilateral pleural effusions, interlobular septal thickening. Stigmata of emphysema, with increased retrosternal airspace, flattened hemidiaphragms, increased AP diameter, and hyperinflation on the AP view. Degenerative changes the spine.  No displaced fracture. No pneumothorax IMPRESSION: Evidence of acute congestive heart failure with small pleural effusions. Surgical changes of median sternotomy and CABG Electronically Signed   By: Corrie Mckusick D.O.   On: 12/27/2021 12:28   ECHOCARDIOGRAM COMPLETE  Result Date: 12/29/2021    ECHOCARDIOGRAM REPORT   Patient Name:   Ethan Mcneil Date of Exam: 12/29/2021 Medical Rec #:  [id number]       Height:       68.0 in Accession #:    [id number]      Weight:       193.6 lb Date of Birth:  December 18, 1933       BSA:          2.016 m Patient Age:    22 years        BP:           168/57 mmHg Patient Gender: M               HR:           59 bpm. Exam Location:  Inpatient Procedure: 2D Echo, Color Doppler and Cardiac Doppler Indications:    I33.82 Acute systolic (congestive) heart failure  History:        Patient has prior history of Echocardiogram  examinations, most                 recent 05/27/2021. CAD; Risk Factors:Hypertension, Diabetes and                 Dyslipidemia. Prior performed at [facility].  Sonographer:    Raquel Sarna Senior RDCS Referring Phys: [id number] [place]  1. Left ventricular ejection fraction, by estimation, is 55%. The left ventricle has normal function. The left ventricle has no regional wall motion abnormalities. There is mild concentric left ventricular hypertrophy. Left ventricular diastolic parameters are consistent with Grade II diastolic dysfunction (pseudonormalization).  2. Right ventricular systolic function is normal. The right ventricular size is normal. There is mildly elevated pulmonary artery systolic pressure. The estimated right ventricular systolic pressure is 73.4 mmHg.  3. Left atrial size was mildly dilated.  4. The mitral valve is abnormal. Mild to moderate mitral valve regurgitation. There is pulmonary vein blunting without reversal. Study may underestimate regurgitation severity (best seen long axis view). No evidence of mitral stenosis.  5. Tricuspid valve regurgitation is moderate to severe.  6. The aortic valve is tricuspid. Aortic valve regurgitation is not visualized.  7. Pulmonic valve regurgitation is moderate.  8. Mildly dilated pulmonary artery.  9. The inferior vena cava is normal in size with greater than 50% respiratory variability, suggesting right atrial pressure of 3 mmHg. Comparison(s): Compared to outside report, tricuspid and mitral valve regurgitation are worse. FINDINGS  Left Ventricle: Left ventricular ejection fraction, by estimation, is 55%. The left ventricle has normal function. The  left ventricle has no regional wall motion abnormalities. The left ventricular internal cavity size was normal in size. There is mild concentric left ventricular hypertrophy. Left ventricular diastolic parameters are consistent with Grade II diastolic dysfunction (pseudonormalization). Right Ventricle:  The right ventricular size is normal. No increase in right ventricular wall thickness. Right ventricular systolic function is normal. There is mildly elevated pulmonary artery systolic pressure. The tricuspid regurgitant velocity is 3.21  m/s, and with an assumed right atrial pressure of 3 mmHg, the estimated right ventricular systolic pressure is 94.7 mmHg. Left Atrium: Left atrial size was mildly dilated. Right Atrium: Right atrial size was normal in size. Pericardium: There is no evidence of pericardial effusion. Mitral Valve: The mitral valve is abnormal. Moderate mitral annular calcification. Mild to moderate mitral valve regurgitation. No evidence of mitral valve stenosis. MV peak gradient, 9.0 mmHg. The mean mitral valve gradient is 3.0 mmHg. Tricuspid Valve: The tricuspid valve is normal in structure. Tricuspid valve regurgitation is moderate to severe. Aortic Valve: The aortic valve is tricuspid. Aortic valve regurgitation is not visualized. Pulmonic Valve: The pulmonic valve was normal in structure. Pulmonic valve regurgitation is moderate. No evidence of pulmonic stenosis. Aorta: The aortic root and ascending aorta are structurally normal, with no evidence of dilitation. Pulmonary Artery: The pulmonary artery is mildly dilated. Venous: The inferior vena cava is normal in size with greater than 50% respiratory variability, suggesting right atrial pressure of 3 mmHg. IAS/Shunts: No atrial level shunt detected by color flow Doppler. Additional Comments: Moderate ascites is present.  LEFT VENTRICLE PLAX 2D LVIDd:         5.20 cm   Diastology LVIDs:         3.90 cm   LV e' medial:    6.68 cm/s LV PW:         1.30 cm   LV E/e' medial:  20.4 LV IVS:        1.20 cm   LV e' lateral:   7.93 cm/s LVOT diam:     2.40 cm   LV E/e' lateral: 17.2 LV SV:         106 LV SV Index:   53 LVOT Area:     4.52 cm  RIGHT VENTRICLE RV S prime:     8.55 cm/s TAPSE (M-mode): 1.8 cm LEFT ATRIUM             Index        RIGHT ATRIUM            Index LA diam:        4.40 cm 2.18 cm/m   RA Area:     17.30 cm LA Vol (A2C):   66.4 ml 32.94 ml/m  RA Volume:   43.80 ml  21.73 ml/m LA Vol (A4C):   80.2 ml 39.79 ml/m LA Biplane Vol: 74.7 ml 37.06 ml/m  AORTIC VALVE LVOT Vmax:   102.00 cm/s LVOT Vmean:  69.100 cm/s LVOT VTI:    0.235 m  AORTA Ao Root diam: 3.90 cm Ao Asc diam:  3.50 cm MITRAL VALVE                TRICUSPID VALVE MV Area (PHT): 3.46 cm     TR Peak grad:   41.2 mmHg MV Area VTI:   2.10 cm     TR Vmax:        321.00 cm/s MV Peak grad:  9.0 mmHg MV Mean grad:  3.0 mmHg     SHUNTS  MV Vmax:       1.50 m/s     Systemic VTI:  0.24 m MV Vmean:      89.5 cm/s    Systemic Diam: 2.40 cm MV Decel Time: 219 msec MV E velocity: 136.00 cm/s MV A velocity: 99.20 cm/s MV E/A ratio:  1.37 Rudean Haskell MD Electronically signed by Rudean Haskell MD Signature Date/Time: 12/29/2021/4:22:29 PM    Final    Disposition   Pt is being discharged home today in good condition.  Follow-up Plans & Appointments    Follow-up Information     Minus Breeding, MD Follow up.   Specialty: Cardiology Why: 01/07/2022 at 9:20 AM Labs will be drawn at this visit, do not need to fast prior to appointment Contact information: [address] [place] [address] [phone]                Discharge Instructions     Diet - low sodium heart healthy   Complete by: As directed    Increase activity slowly   Complete by: As directed        Discharge Medications   Allergies as of 01/01/2022       Reactions   Ciprofloxacin Itching   Codeine Nausea And Vomiting        Medication List     TAKE these medications    acetaminophen 500 MG tablet Commonly known as: TYLENOL Take 500 mg by mouth at bedtime.   amLODipine 5 MG tablet Commonly known as: NORVASC Take 1 tablet (5 mg total) by mouth daily. Start taking on: January 02, 2022   aspirin EC 81 MG tablet Take 81 mg by mouth at bedtime.   B-12 1000  MCG Tabs Take 1,000 mcg by mouth daily.   B-D UF III MINI PEN NEEDLES 31G X 5 MM Misc Generic drug: Insulin Pen Needle USE 3 TIMES A DAY AS DIRECTED   Basaglar KwikPen 100 UNIT/ML Inject 6 Units into the skin daily.   FreeStyle Libre 14 Day Sensor Misc USE 1 SENSOR EVERY 14 (FOURTEEN) DAYS.   FREESTYLE LITE test strip Generic drug: glucose blood CHECK BLOOD SUGAR NO MORE THAN TWICE DAILY.   furosemide 40 MG tablet Commonly known as: LASIX Take 1 tablet (40 mg total) by mouth daily.   hydrALAZINE 100 MG tablet Commonly known as: APRESOLINE Take 100 mg by mouth 3 (three) times daily.   levothyroxine 150 MCG tablet Commonly known as: SYNTHROID Take 1 tablet (150 mcg total) by mouth daily before breakfast.   metoprolol succinate 50 MG 24 hr tablet Commonly known as: TOPROL-XL Take 1 tablet (50 mg total) by mouth daily. TAKE WITH OR IMMEDIATELY FOLLOWING A MEAL.   nitroGLYCERIN 0.4 MG SL tablet Commonly known as: NITROSTAT Place 1 tablet (0.4 mg total) under the tongue every 5 (five) minutes x 3 doses as needed for chest pain.   NovoLOG FlexPen 100 UNIT/ML FlexPen Generic drug: insulin aspart 3 times a day (just before each meal), 5-6-6 units. What changed:  how much to take when to take this additional instructions   omeprazole 40 MG capsule Commonly known as: PRILOSEC Take 1 capsule (40 mg total) by mouth daily.   simvastatin 20 MG tablet Commonly known as: ZOCOR TAKE 1 TABLET BY MOUTH EVERYDAY AT BEDTIME   sodium bicarbonate 650 MG tablet Take 650 mg by mouth 2 (two) times daily.   Vitamin D3 50 MCG (2000 UT) capsule Take 2,000 Units by mouth daily.   Zenpep  25000-79000 units Cpep Generic drug: Pancrelipase (Lip-Prot-Amyl) Take 2 capsules by mouth with every meal and snacks What changed:  how much to take how to take this when to take this additional instructions           Outstanding Labs/Studies   BMP and BNP at appointment on 1/23     Duration of Discharge Encounter   Greater than 30 minutes including physician time.  Signed, Margie Billet, PA-C 01/01/2022, 11:16 AM

## 2022-01-01 NOTE — Progress Notes (Signed)
Mobility Specialist Progress Note:   01/01/22 1146  Mobility  Activity Ambulated with assistance in hallway  Level of Assistance Independent  Assistive Device None  Distance Ambulated (ft) 520 ft  Activity Response Tolerated well  $Mobility charge 1 Mobility   Pt received in bed willing to participate in mobility. No complaints of pain and asymptomatic. Pt left in bed with call bell in reach and all needs met.   E [facility] Public librarian Phone [phone] Secondary Phone [phone]

## 2022-01-02 ENCOUNTER — Telehealth: Payer: Self-pay

## 2022-01-02 ENCOUNTER — Other Ambulatory Visit (HOSPITAL_COMMUNITY): Payer: Self-pay

## 2022-01-02 NOTE — Telephone Encounter (Signed)
Transition Care Management Unsuccessful Follow-up Telephone Call  Date of discharge and from where:  01/01/22 [facility]  Attempts:  1st Attempt  Reason for unsuccessful TCM follow-up call:  Left voice message. Will follow.

## 2022-01-03 ENCOUNTER — Ambulatory Visit (INDEPENDENT_AMBULATORY_CARE_PROVIDER_SITE_OTHER): Payer: Medicare HMO | Admitting: Internal Medicine

## 2022-01-03 ENCOUNTER — Encounter: Payer: Self-pay | Admitting: Internal Medicine

## 2022-01-03 VITALS — BP 190/72 | HR 72 | Temp 97.8°F | Resp 16 | Ht 68.0 in | Wt 163.1 lb

## 2022-01-03 DIAGNOSIS — E039 Hypothyroidism, unspecified: Secondary | ICD-10-CM | POA: Diagnosis not present

## 2022-01-03 DIAGNOSIS — I10 Essential (primary) hypertension: Secondary | ICD-10-CM | POA: Diagnosis not present

## 2022-01-03 DIAGNOSIS — I5032 Chronic diastolic (congestive) heart failure: Secondary | ICD-10-CM

## 2022-01-03 MED ORDER — FREESTYLE LIBRE 14 DAY SENSOR MISC: 3 refills | Status: DC

## 2022-01-03 NOTE — Progress Notes (Signed)
Subjective:    Patient ID: Ethan Mcneil, male    DOB: 1934/11/18, 88 y.o.   MRN: [id number]  DOS:  01/03/2022 Type of visit - description: Hospital follow-up next Admitted to hospital 12/28/2021, discharged 4 days later. Dx acute diastolic CHF. Echo with EF of 55%, s/p IV diuresis, creatinine hovering around 2.7. At the time of discharge volume statu was okay, was recommended Lasix 40 mg daily.   Review of Systems Since he left the hospital he is feeling well. Denies chest pain. DOE significantly improved Edema no worse since he left the hospital. No palpitations No nausea, vomiting or blood in the stools. No headaches  Past Medical History:  Diagnosis Date   Anemia    Arthritis    CAD (coronary artery disease)    CABG 1997; grafts patent @ cath [facility] 11/09   DM2 (diabetes mellitus, type 2) ([place])    Gout    after tick bite   History of kidney stones    HLD (hyperlipidemia)    HTN (hypertension)    essential nos   Hypothyroid    Pancreatic lesion    Postsurgical aortocoronary bypass status    PVD (peripheral vascular disease) ([place])    S/P herniorrhaphy    Tick fever 2012   [facility] , [place]   Unspecified hyperplasia of prostate without urinary obstruction and other lower urinary tract symptoms (LUTS)    Urolithiasis 2015   hx of   Wears dentures    Wears glasses     Past Surgical History:  Procedure Laterality Date   BIOPSY  08/07/2020   Procedure: BIOPSY;  Surgeon: Irving Copas., MD;  Location: [place];  Service: Gastroenterology;;   CATARACT EXTRACTION Bilateral    CORONARY ARTERY BYPASS GRAFT     x7 vessels 1997; cath 2002 and 2009; grafts patent    [place], URETEROSCOPY AND STENT PLACEMENT Right 02/21/2014   Procedure: CYSTOSCOPY WITH RIGHT  RETROGRADE PYELOGRAM,RIGHT  URETEROSCOPY WITH STONE BASKETING EXTRACTION;  Surgeon: Irine Seal, MD;  Location: [facility];  Service: Urology;  Laterality: Right;    ESOPHAGOGASTRODUODENOSCOPY (EGD) WITH PROPOFOL N/A 08/07/2020   Procedure: ESOPHAGOGASTRODUODENOSCOPY (EGD) WITH PROPOFOL;  Surgeon: Rush Landmark Telford Nab., MD;  Location: [place];  Service: Gastroenterology;  Laterality: N/A;   EUS N/A 08/07/2020   Procedure: UPPER ENDOSCOPIC ULTRASOUND (EUS) LINEAR;  Surgeon: Irving Copas., MD;  Location: [place];  Service: Gastroenterology;  Laterality: N/A;   FINE NEEDLE ASPIRATION N/A 08/07/2020   Procedure: FINE NEEDLE ASPIRATION (FNA) LINEAR;  Surgeon: Irving Copas., MD;  Location: [place];  Service: Gastroenterology;  Laterality: N/A;   HERNIA REPAIR     INGUINAL HERNIA REPAIR     MULTIPLE TOOTH EXTRACTIONS     SHOULDER SURGERY     SPLENECTOMY, TOTAL N/A 10/26/2020   Procedure: SPLENECTOMY;  Surgeon: Stark Klein, MD;  Location: [place];  Service: General;  Laterality: N/A;    Current Outpatient Medications  Medication Instructions   acetaminophen (TYLENOL) 500 mg, Oral, Daily at bedtime   amLODipine (NORVASC) 5 mg, Oral, Daily   aspirin EC 81 mg, Oral, Daily at bedtime   B-12 1,000 mcg, Oral, Daily   B-D UF III MINI PEN NEEDLES 31G X 5 MM MISC USE 3 TIMES A DAY AS DIRECTED   Basaglar KwikPen 6 Units, Subcutaneous, Daily   Continuous Blood Gluc Sensor (FREESTYLE LIBRE 14 DAY SENSOR) MISC USE 1 SENSOR EVERY 14 (FOURTEEN) DAYS.   FREESTYLE LITE test strip  CHECK BLOOD SUGAR NO MORE THAN TWICE DAILY.   furosemide (LASIX) 40 mg, Oral, Daily   hydrALAZINE (APRESOLINE) 100 mg, Oral, 3 times daily   insulin aspart (NOVOLOG FLEXPEN) 100 UNIT/ML FlexPen 3 times a day (just before each meal), 5-6-6 units.   levothyroxine (SYNTHROID) 150 mcg, Oral, Daily before breakfast   metoprolol succinate (TOPROL-XL) 50 mg, Oral, Daily, TAKE WITH OR IMMEDIATELY FOLLOWING A MEAL.   nitroGLYCERIN (NITROSTAT) 0.4 mg, Sublingual, Every 5 min x3 PRN   omeprazole (PRILOSEC) 40 mg, Oral, Daily   Pancrelipase, Lip-Prot-Amyl, (ZENPEP)  25000-79000 units CPEP Take 2 capsules by mouth with every meal and snacks   simvastatin (ZOCOR) 20 MG tablet TAKE 1 TABLET BY MOUTH EVERYDAY AT BEDTIME   sodium bicarbonate 650 mg, Oral, 2 times daily   Vitamin D3 2,000 Units, Oral, Daily       Objective:   Physical Exam BP (!) 190/72 (BP Location: Left Arm, Patient Position: Sitting, Cuff Size: Small) Comment: hasn't had meds yet this Am   Pulse 72    Temp 97.8 F (36.6 C) (Oral)    Resp 16    Ht 5\' 8"  (1.727 m)    Wt 163 lb 2 oz (74 kg)    BMI 24.80 kg/m  General:   Well developed, NAD, BMI noted. HEENT:  Normocephalic . Face symmetric, atraumatic Lungs:  CTA B Normal respiratory effort, no intercostal retractions, no accessory muscle use. Heart: RRR,  no murmur.  Lower extremities: L leg edema at baseline.  Right leg edema resolved. Skin: Not pale. Not jaundice Neurologic:  alert & oriented X3.  Speech normal, gait appropriate for age and unassisted Psych--  Cognition and judgment appear intact.  Cooperative with normal attention span and concentration.  Behavior appropriate. No anxious or depressed appearing.      Assessment    Assessment DM -- w/ CAD, no neuropathy, intolerant to metformin , sees ENDO HTN Hyperlipidemia Hypothyroidism DJD BPH, LUTS,h/o urolithiasis -- sees urology CKD: started to see renal 06/2020 Anemia  CV: --CAD, CABG 1997, cardiac cath 2009 --PVD (no ABIs in the chart) --L leg edema, chronic, (-) DVT per [place] 2015 Gout GI: Pancreatic insufficiency dx 02-2017 B 12 deficiency, mild 2015  Wt loss:  2012 187 lb, 2014 173 lb , 2015 167 01-2017: EGD normal, BX chronic active gastritis. Colonoscopy normal. Saw GI 02-2017: likely d/t Pancreatic insufficiency and poorly controlled diabetes. 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  Surgeries 10-2020 Asplenia Covid  dx 05/2021    PLAN: Acute diastolic CHF: Patient scheduled to have hospital follow-up tomorrow but presented to the clinic today so we proceeded with the visit . Was d/c discharge 01/01/2022, s/p IV diuresis, creatinine at the hospital remain at elevated but stable at the time of discharge.  Per our scales he has lost 30 pounds. Edema has essentially resolved, no scrotal edema. Last BMP was 48 hours ago. Plan: Continue daily weight, watch salt intake, take meds as prescribed, has a follow-up with cardiology in 3 days. HTN: BP elevated, has only taking Lasix today. Denies any headaches or other symptoms. Plan: Take medications regularly (Lasix, hydralazine, amlodipine, , Metoprolol); check BP tomorrow at the pharmacy, let me know if is still elevated. DM: Per Endo, refills for  strips sent. Anemia: Mild, noted during the admission, iron levels were normal. Hypothyroidism: Last TSH satisfactory, on Synthroid, recheck on  RTC. RTC 2 to 3 months    This visit occurred during the SARS-CoV-2 public health emergency.  Safety protocols were in place, including screening questions prior to the visit, additional usage of staff PPE, and extensive cleaning of exam room while observing appropriate contact time as indicated for disinfecting solutions.

## 2022-01-03 NOTE — Telephone Encounter (Signed)
Patient was seen face to face within 2 days of discharge. Qualifies for TCM coding.

## 2022-01-03 NOTE — Assessment & Plan Note (Signed)
Acute diastolic CHF: Patient scheduled to have hospital follow-up tomorrow but presented to the clinic today so we proceeded with the visit . Was d/c discharge 01/01/2022, s/p IV diuresis, creatinine at the hospital remain at elevated but stable at the time of discharge.  Per our scales he has lost 30 pounds. Edema has essentially resolved, no scrotal edema. Last BMP was 48 hours ago. Plan: Continue daily weight, watch salt intake, take meds as prescribed, has a follow-up with cardiology in 3 days. HTN: BP elevated, has only taking Lasix today. Denies any headaches or other symptoms. Plan: Take medications regularly (Lasix, hydralazine, amlodipine, , Metoprolol); check BP tomorrow at the pharmacy, let me know if is still elevated. DM: Per Endo, refills for  strips sent. Anemia: Mild, noted during the admission, iron levels were normal. Hypothyroidism: Last TSH satisfactory, on Synthroid, recheck on RTC. RTC 2 to 3 months

## 2022-01-03 NOTE — Patient Instructions (Signed)
Check your weight daily    GO TO THE FRONT DESK, PLEASE SCHEDULE YOUR APPOINTMENTS Come back for  a check up in 2-3 months

## 2022-01-03 NOTE — Telephone Encounter (Signed)
Opened in error

## 2022-01-04 ENCOUNTER — Inpatient Hospital Stay: Payer: Medicare HMO | Admitting: Internal Medicine

## 2022-01-06 NOTE — Progress Notes (Signed)
Cardiology Office Note   Date:  01/07/2022   ID:  Davian, Hanshaw March 07, 1934, MRN [id number]  PCP:  Colon Branch, MD  Cardiologist:   Minus Breeding, MD   Chief Complaint  Patient presents with   Shortness of Breath      History of Present Illness: Ethan Mcneil is a 88 y.o. male who presents for evaluation of heart failure.  He has a history of CAD with CABG in 1997 and a prior cardiac cath in 2009 that showed patent grafts.  Has chronic kidney disease as well.  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  He was recently hospitalized with anasarca.  He has a well preserved EF.  Since going home he has felt better.  He had about 40 pounds down and his weight at home was 155 on his scale.  Its been steady.  He has been watching his salt.  He is keeping his feet up.  He is breathing much better.  He denies any new shortness of breath, PND or orthopnea.  Has had no new palpitations, presyncope or syncope.  He has had no chest discomfort.  Has had no cough fevers or chills.   Past Medical History:  Diagnosis Date   Anemia    Arthritis    CAD (coronary artery disease)    CABG 1997; grafts patent @ cath [facility] 11/09   DM2 (diabetes mellitus, type 2) ([place])    Gout    after tick bite   History of kidney stones    HLD (hyperlipidemia)    HTN (hypertension)    essential nos   Hypothyroid    Pancreatic lesion    Postsurgical aortocoronary bypass status    PVD (peripheral vascular disease) ([place])    S/P herniorrhaphy    Tick fever 2012   [facility] , [place]   Unspecified hyperplasia of prostate without urinary obstruction and other lower urinary tract symptoms (LUTS)    Urolithiasis 2015   hx of   Wears dentures    Wears glasses     Past Surgical History:  Procedure Laterality Date   BIOPSY  08/07/2020   Procedure: BIOPSY;  Surgeon: Irving Copas., MD;  Location: [place];  Service: Gastroenterology;;   CATARACT  EXTRACTION Bilateral    CORONARY ARTERY BYPASS GRAFT     x7 vessels 1997; cath 2002 and 2009; grafts patent    CYSTOSCOPY WITH RETROGRADE PYELOGRAM, URETEROSCOPY AND STENT PLACEMENT Right 02/21/2014   Procedure: CYSTOSCOPY WITH RIGHT  RETROGRADE PYELOGRAM,RIGHT  URETEROSCOPY WITH STONE BASKETING EXTRACTION;  Surgeon: Irine Seal, MD;  [facility];  Service: Urology;  Laterality: Right;   ESOPHAGOGASTRODUODENOSCOPY (EGD) WITH PROPOFOL N/A 08/07/2020   Procedure: ESOPHAGOGASTRODUODENOSCOPY (EGD) WITH PROPOFOL;  Surgeon: Rush Landmark Telford Nab., MD;  Location: [place];  Service: Gastroenterology;  Laterality: N/A;   EUS N/A 08/07/2020   Procedure: UPPER ENDOSCOPIC ULTRASOUND (EUS) LINEAR;  Surgeon: Irving Copas., MD;  Location: [place];  Service: Gastroenterology;  Laterality: N/A;   FINE NEEDLE ASPIRATION N/A 08/07/2020   Procedure: FINE NEEDLE ASPIRATION (FNA) LINEAR;  Surgeon: Irving Copas., MD;  Location: [place];  Service: Gastroenterology;  Laterality: N/A;   HERNIA REPAIR     INGUINAL HERNIA REPAIR     MULTIPLE TOOTH EXTRACTIONS     SHOULDER SURGERY     SPLENECTOMY, TOTAL N/A 10/26/2020   Procedure: SPLENECTOMY;  Surgeon: Stark Klein, MD;  Location: [place];  Service: General;  Laterality: N/A;     Current Outpatient Medications  Medication Sig Dispense Refill   acetaminophen (TYLENOL) 500 MG tablet Take 500 mg by mouth at bedtime.     aspirin EC 81 MG tablet Take 81 mg by mouth at bedtime.     B-D UF III MINI PEN NEEDLES 31G X 5 MM MISC USE 3 TIMES A DAY AS DIRECTED 100 each 2   Cholecalciferol (VITAMIN D3) 50 MCG (2000 UT) capsule Take 2,000 Units by mouth daily.     Continuous Blood Gluc Receiver (FREESTYLE LIBRE 2 READER) DEVI Use as instructed to check blood sugar 1 each 0   Continuous Blood Gluc Sensor (FREESTYLE LIBRE 14 DAY SENSOR) MISC USE 1 SENSOR EVERY 14 (FOURTEEN) DAYS. 6 each 3   Cyanocobalamin (B-12) 1000 MCG TABS Take 1,000 mcg by  mouth daily.     FREESTYLE LITE test strip CHECK BLOOD SUGAR NO MORE THAN TWICE DAILY. 200 strip 0   furosemide (LASIX) 40 MG tablet Take 1 tablet (40 mg total) by mouth daily. 30 tablet 3   hydrALAZINE (APRESOLINE) 100 MG tablet Take 100 mg by mouth 3 (three) times daily.     insulin aspart (NOVOLOG FLEXPEN) 100 UNIT/ML FlexPen 3 times a day (just before each meal), 5-6-6 units. (Patient taking differently: 6 Units 3 (three) times daily with meals.) 15 mL 11   Insulin Glargine (BASAGLAR KWIKPEN) 100 UNIT/ML Inject 6 Units into the skin daily. 15 mL 0   levothyroxine (SYNTHROID) 150 MCG tablet Take 1 tablet (150 mcg total) by mouth daily before breakfast. 90 tablet 0   metoprolol succinate (TOPROL-XL) 50 MG 24 hr tablet Take 1 tablet (50 mg total) by mouth daily. TAKE WITH OR IMMEDIATELY FOLLOWING A MEAL. 30 tablet 1   omeprazole (PRILOSEC) 40 MG capsule Take 1 capsule (40 mg total) by mouth daily. 90 capsule 2   Pancrelipase, Lip-Prot-Amyl, (ZENPEP) 25000-79000 units CPEP Take 2 capsules by mouth with every meal and snacks (Patient taking differently: Take 1 capsule by mouth daily at 12 noon.) 540 capsule 1   simvastatin (ZOCOR) 20 MG tablet TAKE 1 TABLET BY MOUTH EVERYDAY AT BEDTIME 90 tablet 1   sodium bicarbonate 650 MG tablet Take 650 mg by mouth 2 (two) times daily.     amLODipine (NORVASC) 5 MG tablet TAKE 1 AND 1/2 TABLETS DAILY 135 tablet 3   nitroGLYCERIN (NITROSTAT) 0.4 MG SL tablet Place 1 tablet (0.4 mg total) under the tongue every 5 (five) minutes x 3 doses as needed for chest pain. (Patient not taking: Reported on 01/07/2022) 25 tablet 2   No current facility-administered medications for this visit.    Allergies:   Ciprofloxacin and Codeine   ROS:  Please see the history of present illness.   Otherwise, review of systems are positive for none.   All other systems are reviewed and negative.    PHYSICAL EXAM: VS:  BP (!) 150/70    Pulse (!) 55    Ht 5\' 9"  (1.753 m)    Wt 166 lb  (75.3 kg)    SpO2 96%    BMI 24.51 kg/m  , BMI Body mass index is 24.51 kg/m. GENERAL: Slightly frail appearing NECK:  No jugular venous distention, waveform within normal limits, carotid upstroke brisk and symmetric, no bruits, no thyromegaly LUNGS:  Clear to auscultation bilaterally CHEST:  Well healed sternotomy scar. HEART:  PMI not displaced or sustained,S1 and S2 within normal limits, no S3, no S4, no clicks, no rubs, no  murmurs ABD:  Flat, positive bowel sounds normal in frequency in pitch, no bruits, no rebound, no guarding, no midline pulsatile mass, no hepatomegaly, no splenomegaly EXT:  2 plus pulses throughout, moderate bilateral ankle edema, no cyanosis no clubbing     EKG:  EKG is not ordered today.   Recent Labs: 12/27/2021: TSH 2.20 12/28/2021: B Natriuretic Peptide 1,726.3; Hemoglobin 10.4; Platelets 178 01/01/2022: BUN 37; Creatinine, Ser 2.88; Potassium 4.2; Sodium 137    Lipid Panel    Component Value Date/Time   CHOL 109 09/25/2021 0901   TRIG 87.0 09/25/2021 0901   HDL 48.60 09/25/2021 0901   CHOLHDL 2 09/25/2021 0901   VLDL 17.4 09/25/2021 0901   LDLCALC 43 09/25/2021 0901      Wt Readings from Last 3 Encounters:  01/07/22 166 lb (75.3 kg)  01/03/22 163 lb 2 oz (74 kg)  01/01/22 154 lb 1.6 oz (69.9 kg)      Other studies Reviewed: Additional studies/ records that were reviewed today include: Hospital records. Review of the above records demonstrates:  Please see elsewhere in the note.     ASSESSMENT AND PLAN:  Acute diastolic CHF:    We talked again about salt and fluid management.  He seems to be euvolemic and he will continue with daily weights.  I will check a basic metabolic profile.  CAD s/p CABG 1997:   He has no ongoing ischemic symptoms.  No change in therapy.  HTN: BP is not at target and I am going to increase his amlodipine to 7 and half milligrams daily.    HLD  LDL  was was 43 with an HDL of 49.  No change in therapy.    DM II:    Per his primary.  A1c was 6.4.  CKD stage IV:   Creat was 2.88 at discharge.   I will check this again today.  He does follow-up with nephrology and sees them in February.    Current medicines are reviewed at length with the patient today.  The patient does not have concerns regarding medicines.  The following changes have been made: As above  Labs/ tests ordered today include:   Orders Placed This Encounter  Procedures   Basic metabolic panel     Disposition:   FU with APP in 6 weeks   Signed, Minus Breeding, MD  01/07/2022 10:07 AM    [place]

## 2022-01-07 ENCOUNTER — Other Ambulatory Visit: Payer: Self-pay

## 2022-01-07 ENCOUNTER — Other Ambulatory Visit: Payer: Self-pay | Admitting: *Deleted

## 2022-01-07 ENCOUNTER — Telehealth: Payer: Self-pay | Admitting: Endocrinology

## 2022-01-07 ENCOUNTER — Ambulatory Visit: Payer: Medicare HMO | Admitting: Cardiology

## 2022-01-07 ENCOUNTER — Encounter: Payer: Self-pay | Admitting: Cardiology

## 2022-01-07 VITALS — BP 150/70 | HR 55 | Ht 69.0 in | Wt 166.0 lb

## 2022-01-07 DIAGNOSIS — I5031 Acute diastolic (congestive) heart failure: Secondary | ICD-10-CM

## 2022-01-07 DIAGNOSIS — Z794 Long term (current) use of insulin: Secondary | ICD-10-CM | POA: Diagnosis not present

## 2022-01-07 DIAGNOSIS — I10 Essential (primary) hypertension: Secondary | ICD-10-CM | POA: Diagnosis not present

## 2022-01-07 DIAGNOSIS — E1169 Type 2 diabetes mellitus with other specified complication: Secondary | ICD-10-CM

## 2022-01-07 DIAGNOSIS — E785 Hyperlipidemia, unspecified: Secondary | ICD-10-CM

## 2022-01-07 DIAGNOSIS — I5041 Acute combined systolic (congestive) and diastolic (congestive) heart failure: Secondary | ICD-10-CM

## 2022-01-07 DIAGNOSIS — E1151 Type 2 diabetes mellitus with diabetic peripheral angiopathy without gangrene: Secondary | ICD-10-CM | POA: Diagnosis not present

## 2022-01-07 DIAGNOSIS — N184 Chronic kidney disease, stage 4 (severe): Secondary | ICD-10-CM | POA: Diagnosis not present

## 2022-01-07 DIAGNOSIS — I2510 Atherosclerotic heart disease of native coronary artery without angina pectoris: Secondary | ICD-10-CM

## 2022-01-07 MED ORDER — FREESTYLE LIBRE 2 READER DEVI: 0 refills | Status: DC

## 2022-01-07 MED ORDER — AMLODIPINE BESYLATE 5 MG PO TABS: ORAL_TABLET | ORAL | 3 refills | Status: DC

## 2022-01-07 MED ORDER — FREESTYLE LIBRE 14 DAY SENSOR MISC: 3 refills | Status: DC

## 2022-01-07 NOTE — Patient Instructions (Addendum)
Medication Instructions:  INCREASE YOUR AMLODIPINE TO 5 MG 1 AND 1/2 TABLETS DAILY   *If you need a refill on your cardiac medications before your next appointment, please call your pharmacy*  Lab Work: BMET TODAY   If you have labs (blood work) drawn today and your tests are completely normal, you will receive your results only by: [place] (if you have MyChart) OR A paper copy in the mail If you have any lab test that is abnormal or we need to change your treatment, we will call you to review the results.  Testing/Procedures: NONE  Follow-Up: At [facility], you and your health needs are our priority.  As part of our continuing mission to provide you with exceptional heart care, we have created designated Provider Care Teams.  These Care Teams include your primary Cardiologist (physician) and Advanced Practice Providers (APPs -  Physician Assistants and Nurse Practitioners) who all work together to provide you with the care you need, when you need it.  We recommend signing up for the patient portal called "MyChart".  Sign up information is provided on this After Visit Summary.  MyChart is used to connect with patients for Virtual Visits (Telemedicine).  Patients are able to view lab/test results, encounter notes, upcoming appointments, etc.  Non-urgent messages can be sent to your provider as well.   To learn more about what you can do with MyChart, go to [email or web address].    Your next appointment:   6 week(s)  The format for your next appointment:   In Person  Provider:   NP OR PA   Other Instructions   IT IS RECOMMENDED YOU PURCHASE A PULSE OXIMETRY FOR YOUR FINGER   PLEASE REVIEW THE SALTY SIX DIET GIVEN TODAY

## 2022-01-07 NOTE — Telephone Encounter (Signed)
Patient is needing Freestyle Libre 2, Reader, and Sensor.  Please forward to:  [id number], [place]. Phone:  [phone]  Fax:  [phone]

## 2022-01-07 NOTE — Telephone Encounter (Signed)
RX for [employer] 2 sensors and reader has now been sent to preferred pharmacy.

## 2022-01-08 DIAGNOSIS — N184 Chronic kidney disease, stage 4 (severe): Secondary | ICD-10-CM | POA: Diagnosis not present

## 2022-01-08 DIAGNOSIS — D631 Anemia in chronic kidney disease: Secondary | ICD-10-CM | POA: Diagnosis not present

## 2022-01-08 LAB — BASIC METABOLIC PANEL
BUN/Creatinine Ratio: 17 (ref 10–24)
BUN: 41 mg/dL — ABNORMAL HIGH (ref 8–27)
CO2: 22 mmol/L (ref 20–29)
Calcium: 8.9 mg/dL (ref 8.6–10.2)
Chloride: 99 mmol/L (ref 96–106)
Creatinine, Ser: 2.48 mg/dL — ABNORMAL HIGH (ref 0.76–1.27)
Glucose: 240 mg/dL — ABNORMAL HIGH (ref 70–99)
Potassium: 4.8 mmol/L (ref 3.5–5.2)
Sodium: 134 mmol/L (ref 134–144)
eGFR: 25 mL/min/{1.73_m2} — ABNORMAL LOW (ref >59)

## 2022-01-09 ENCOUNTER — Encounter: Payer: Self-pay | Admitting: *Deleted

## 2022-01-14 DIAGNOSIS — N184 Chronic kidney disease, stage 4 (severe): Secondary | ICD-10-CM | POA: Diagnosis not present

## 2022-01-17 ENCOUNTER — Ambulatory Visit: Payer: Medicare Other | Admitting: Internal Medicine

## 2022-01-18 DIAGNOSIS — N2581 Secondary hyperparathyroidism of renal origin: Secondary | ICD-10-CM | POA: Diagnosis not present

## 2022-01-18 DIAGNOSIS — N184 Chronic kidney disease, stage 4 (severe): Secondary | ICD-10-CM | POA: Diagnosis not present

## 2022-01-18 DIAGNOSIS — E875 Hyperkalemia: Secondary | ICD-10-CM | POA: Diagnosis not present

## 2022-01-18 DIAGNOSIS — R609 Edema, unspecified: Secondary | ICD-10-CM | POA: Diagnosis not present

## 2022-01-18 DIAGNOSIS — I129 Hypertensive chronic kidney disease with stage 1 through stage 4 chronic kidney disease, or unspecified chronic kidney disease: Secondary | ICD-10-CM | POA: Diagnosis not present

## 2022-01-18 DIAGNOSIS — E872 Acidosis, unspecified: Secondary | ICD-10-CM | POA: Diagnosis not present

## 2022-01-18 DIAGNOSIS — D631 Anemia in chronic kidney disease: Secondary | ICD-10-CM | POA: Diagnosis not present

## 2022-01-18 DIAGNOSIS — I5030 Unspecified diastolic (congestive) heart failure: Secondary | ICD-10-CM | POA: Diagnosis not present

## 2022-01-22 DIAGNOSIS — N184 Chronic kidney disease, stage 4 (severe): Secondary | ICD-10-CM | POA: Diagnosis not present

## 2022-01-22 DIAGNOSIS — D631 Anemia in chronic kidney disease: Secondary | ICD-10-CM | POA: Diagnosis not present

## 2022-01-23 DIAGNOSIS — D631 Anemia in chronic kidney disease: Secondary | ICD-10-CM | POA: Diagnosis not present

## 2022-01-23 DIAGNOSIS — N189 Chronic kidney disease, unspecified: Secondary | ICD-10-CM | POA: Diagnosis not present

## 2022-01-30 DIAGNOSIS — N189 Chronic kidney disease, unspecified: Secondary | ICD-10-CM | POA: Diagnosis not present

## 2022-01-30 DIAGNOSIS — D631 Anemia in chronic kidney disease: Secondary | ICD-10-CM | POA: Diagnosis not present

## 2022-02-08 DIAGNOSIS — D631 Anemia in chronic kidney disease: Secondary | ICD-10-CM | POA: Diagnosis not present

## 2022-02-08 DIAGNOSIS — N184 Chronic kidney disease, stage 4 (severe): Secondary | ICD-10-CM | POA: Diagnosis not present

## 2022-02-18 ENCOUNTER — Ambulatory Visit: Payer: Medicare HMO | Admitting: Cardiology

## 2022-02-20 ENCOUNTER — Ambulatory Visit: Payer: Medicare HMO | Admitting: Endocrinology

## 2022-02-20 ENCOUNTER — Ambulatory Visit: Payer: Medicare HMO | Admitting: Physician Assistant

## 2022-02-20 ENCOUNTER — Encounter: Payer: Self-pay | Admitting: Physician Assistant

## 2022-02-20 ENCOUNTER — Other Ambulatory Visit: Payer: Self-pay

## 2022-02-20 VITALS — BP 170/58 | HR 55 | Ht 70.0 in | Wt 174.4 lb

## 2022-02-20 DIAGNOSIS — I5031 Acute diastolic (congestive) heart failure: Secondary | ICD-10-CM

## 2022-02-20 DIAGNOSIS — N184 Chronic kidney disease, stage 4 (severe): Secondary | ICD-10-CM

## 2022-02-20 DIAGNOSIS — I25810 Atherosclerosis of coronary artery bypass graft(s) without angina pectoris: Secondary | ICD-10-CM | POA: Diagnosis not present

## 2022-02-20 DIAGNOSIS — E785 Hyperlipidemia, unspecified: Secondary | ICD-10-CM

## 2022-02-20 DIAGNOSIS — I10 Essential (primary) hypertension: Secondary | ICD-10-CM

## 2022-02-20 MED ORDER — FUROSEMIDE 40 MG PO TABS: 40.0000 mg | ORAL_TABLET | Freq: Two times a day (BID) | ORAL | 3 refills | Status: DC

## 2022-02-20 NOTE — Progress Notes (Signed)
Cardiology Office Note:    Date:  02/22/2022   ID:  Ethan Mcneil, DOB 1934-01-06, MRN [id number]  PCP:  Ethan Branch, Ethan Mcneil   [facility] Providers Cardiologist:  Ethan Breeding, Ethan Mcneil     Referring Ethan Mcneil: Ethan Branch, Ethan Mcneil   Chief Complaint  Patient presents with   Follow-up    Seen for Dr. Percival Mcneil    History of Present Illness:    Ethan Mcneil is a 88 y.o. male with a hx of CAD s/p CABG 1997, CKD IV, HTN, HLD, hypothyroidism, and intraductal papillary mucinous neoplasm of pancreas.  Last cardiac catheterization in 2009 revealed patent grafts.  Patient was seen urgently at the recommendation of his PCP on 12/28/2021 for worsening shortness of breath and leg edema.  Patient was seen previously in the emergency room for shortness of breath in December 2022.  BNP at the time was 1900.  Creatinine 2.5.  He received 40 mg IV Lasix which made him feel better.  He was seen by Dr. Marlou Mcneil on 12/28/2021, he was felt to be massively volume overloaded at the time.  Patient was directly admitted to the hospital.  Echocardiogram obtained on 12/29/2021 showed EF 55%, mild LVH, grade 2 DD, RVSP 44.2 mmHg, mild to moderate MR, moderate to severe TR.    Patient presents today for follow-up.  He has at least 2-3+ pitting edema on physical exam.  His weight has increased from 166 pounds to 174 pounds.  He is clearly gaining some the fluid back.  I will increase his Lasix to 40 mg twice a day.  He denies any significant shortness of breath.  His lung seems to be clear on exam.  He will need a basic metabolic panel in 1 week to check his kidney function and electrolyte on the higher dose of diuretic.  Unfortunately Ethan Mcneil does not remember when is the next appointment with nephrologist.  I recommended reassessment in 2 to 3 weeks.  His blood pressure is elevated today even despite increasing the amlodipine during the last visit, he is at the maximum dose of hydralazine and I am unable to increase the beta-blocker  further as his heart rate is in the 50s.  I would not recommend the addition of spironolactone as his creatinine is greater than 2.  Given the significant diuresis he will have in the next few weeks, I will hold off on adjusting the blood pressure medication for now and reassess on follow-up.  Past Medical History:  Diagnosis Date   Anemia    Arthritis    CAD (coronary artery disease)    CABG 1997; grafts patent @ cath [facility] 11/09   DM2 (diabetes mellitus, type 2) ([place])    Gout    after tick bite   History of kidney stones    HLD (hyperlipidemia)    HTN (hypertension)    essential nos   Hypothyroid    Pancreatic lesion    Postsurgical aortocoronary bypass status    PVD (peripheral vascular disease) ([place])    S/P herniorrhaphy    Tick fever 2012   [facility] , [place]   Unspecified hyperplasia of prostate without urinary obstruction and other lower urinary tract symptoms (LUTS)    Urolithiasis 2015   hx of   Wears dentures    Wears glasses     Past Surgical History:  Procedure Laterality Date   BIOPSY  08/07/2020   Procedure: BIOPSY;  Surgeon: Ethan Mcneil., Ethan Mcneil;  Location: [facility]  ENDOSCOPY;  Service: Gastroenterology;;   CATARACT EXTRACTION Bilateral    CORONARY ARTERY BYPASS GRAFT     x7 vessels 1997; cath 2002 and 2009; grafts patent    CYSTOSCOPY WITH RETROGRADE PYELOGRAM, URETEROSCOPY AND STENT PLACEMENT Right 02/21/2014   Procedure: CYSTOSCOPY WITH RIGHT  RETROGRADE PYELOGRAM,RIGHT  URETEROSCOPY WITH STONE BASKETING EXTRACTION;  Surgeon: Ethan Seal, Ethan Mcneil;  Location: [facility];  Service: Urology;  Laterality: Right;   ESOPHAGOGASTRODUODENOSCOPY (EGD) WITH PROPOFOL N/A 08/07/2020   Procedure: ESOPHAGOGASTRODUODENOSCOPY (EGD) WITH PROPOFOL;  Surgeon: Ethan Landmark Telford Nab., Ethan Mcneil;  Location: [place];  Service: Gastroenterology;  Laterality: N/A;   EUS N/A 08/07/2020   Procedure: UPPER ENDOSCOPIC ULTRASOUND (EUS) LINEAR;  Surgeon: Ethan Mcneil., Ethan Mcneil;   Location: [place];  Service: Gastroenterology;  Laterality: N/A;   FINE NEEDLE ASPIRATION N/A 08/07/2020   Procedure: FINE NEEDLE ASPIRATION (FNA) LINEAR;  Surgeon: Ethan Mcneil., Ethan Mcneil;  Location: [place];  Service: Gastroenterology;  Laterality: N/A;   HERNIA REPAIR     INGUINAL HERNIA REPAIR     MULTIPLE TOOTH EXTRACTIONS     SHOULDER SURGERY     SPLENECTOMY, TOTAL N/A 10/26/2020   Procedure: SPLENECTOMY;  Surgeon: Ethan Klein, Ethan Mcneil;  Location: [place];  Service: General;  Laterality: N/A;    Current Medications: Current Meds  Medication Sig   acetaminophen (TYLENOL) 500 MG tablet Take 500 mg by mouth at bedtime.   amLODipine (NORVASC) 5 MG tablet TAKE 1 AND 1/2 TABLETS DAILY   aspirin EC 81 MG tablet Take 81 mg by mouth at bedtime.   B-D UF III MINI PEN NEEDLES 31G X 5 MM MISC USE 3 TIMES A DAY AS DIRECTED   Cholecalciferol (VITAMIN D3) 50 MCG (2000 UT) capsule Take 2,000 Units by mouth daily.   Continuous Blood Gluc Receiver (FREESTYLE LIBRE 2 READER) DEVI Use as instructed to check blood sugar   Continuous Blood Gluc Sensor (FREESTYLE LIBRE 14 DAY SENSOR) MISC USE 1 SENSOR EVERY 14 (FOURTEEN) DAYS.   Cyanocobalamin (B-12) 1000 MCG TABS Take 1,000 mcg by mouth daily.   FREESTYLE LITE test strip CHECK BLOOD SUGAR NO MORE THAN TWICE DAILY.   hydrALAZINE (APRESOLINE) 100 MG tablet Take 100 mg by mouth 3 (three) times daily.   insulin aspart (NOVOLOG FLEXPEN) 100 UNIT/ML FlexPen 3 times a day (just before each meal), 5-6-6 units. (Patient taking differently: 6 Units 3 (three) times daily with meals.)   Insulin Glargine (BASAGLAR KWIKPEN) 100 UNIT/ML Inject 6 Units into the skin daily.   levothyroxine (SYNTHROID) 150 MCG tablet Take 1 tablet (150 mcg total) by mouth daily before breakfast.   metoprolol succinate (TOPROL-XL) 50 MG 24 hr tablet Take 1 tablet (50 mg total) by mouth daily. TAKE WITH OR IMMEDIATELY FOLLOWING A MEAL.   omeprazole (PRILOSEC) 40 MG capsule Take 1  capsule (40 mg total) by mouth daily.   Pancrelipase, Lip-Prot-Amyl, (ZENPEP) 25000-79000 units CPEP Take 2 capsules by mouth with every meal and snacks (Patient taking differently: Take 1 capsule by mouth daily at 12 noon.)   simvastatin (ZOCOR) 20 MG tablet TAKE 1 TABLET BY MOUTH EVERYDAY AT BEDTIME   sodium bicarbonate 650 MG tablet Take 650 mg by mouth 2 (two) times daily.   [DISCONTINUED] furosemide (LASIX) 40 MG tablet Take 1 tablet (40 mg total) by mouth daily.     Allergies:   Ciprofloxacin and Codeine   Social History   Socioeconomic History   Marital status: Married    Spouse name: [name]   Number of children:  4   Years of education: Not on file   Highest education level: Not on file  Occupational History   Occupation: retired, works few hours   Tobacco Use   Smoking status: Never   Smokeless tobacco: Never  Vaping Use   Vaping Use: Never used  Substance and Sexual Activity   Alcohol use: No   Drug use: No   Sexual activity: Not Currently  Other Topics Concern   Not on file  Social History Narrative   Buyer, retail and former race car driver   4 children (boys)   Radio producer form signed appointing wife - [name]; ok to leave msg on home phone [phone]         Social Determinants of Health   Financial Resource Strain: Low Risk    Difficulty of Paying Living Expenses: Not hard at all  Food Insecurity: No Food Insecurity   Worried About Charity fundraiser in the Last Year: Never true   Arboriculturist in the Last Year: Never true  Transportation Needs: No Transportation Needs   Lack of Transportation (Medical): No   Lack of Transportation (Non-Medical): No  Physical Activity: Insufficiently Active   Days of Exercise per Week: 5 days   Minutes of Exercise per Session: 20 min  Stress: No Stress Concern Present   Feeling of Stress : Only a little  Social Connections: Moderately Integrated   Frequency of Communication with Friends and Family: More  than three times a week   Frequency of Social Gatherings with Friends and Family: More than three times a week   Attends Religious Services: More than 4 times per year   Active Member of [employer] or Organizations: No   Attends Music therapist: Never   Marital Status: Married     Family History: The patient's family history includes Coronary artery disease in his mother; Diabetes in his brother; Lung cancer in his father; Prostate cancer in his maternal uncle; Stroke in his son; Throat cancer in his son. There is no history of Ethan cancer, Rectal cancer, Stomach cancer, Esophageal cancer, or Pancreatic cancer.  ROS:   Please see the history of present illness.     All other systems reviewed and are negative.  EKGs/Labs/Other Studies Reviewed:    The following studies were reviewed today:  Echo 12/29/2021   1. Left ventricular ejection fraction, by estimation, is 55%. The left  ventricle has normal function. The left ventricle has no regional wall  motion abnormalities. There is mild concentric left ventricular  hypertrophy. Left ventricular diastolic  parameters are consistent with Grade II diastolic dysfunction  (pseudonormalization).   2. Right ventricular systolic function is normal. The right ventricular  size is normal. There is mildly elevated pulmonary artery systolic  pressure. The estimated right ventricular systolic pressure is 27.7 mmHg.   3. Left atrial size was mildly dilated.   4. The mitral valve is abnormal. Mild to moderate mitral valve  regurgitation. There is pulmonary vein blunting without reversal. Study  may underestimate regurgitation severity (best seen long axis view). No  evidence of mitral stenosis.   5. Tricuspid valve regurgitation is moderate to severe.   6. The aortic valve is tricuspid. Aortic valve regurgitation is not  visualized.   7. Pulmonic valve regurgitation is moderate.   8. Mildly dilated pulmonary artery.   9. The inferior  vena cava is normal in size with greater than 50%  respiratory variability, suggesting right atrial pressure of 3  mmHg.   Comparison(s): Compared to outside report, tricuspid and mitral valve  regurgitation are worse.   EKG:  EKG is not ordered today.    Recent Labs: 12/27/2021: TSH 2.20 12/28/2021: B Natriuretic Peptide 1,726.3; Hemoglobin 10.4; Platelets 178 01/07/2022: BUN 41; Creatinine, Ser 2.48; Potassium 4.8; Sodium 134  Recent Lipid Panel    Component Value Date/Time   CHOL 109 09/25/2021 0901   TRIG 87.0 09/25/2021 0901   HDL 48.60 09/25/2021 0901   CHOLHDL 2 09/25/2021 0901   VLDL 17.4 09/25/2021 0901   LDLCALC 43 09/25/2021 0901     Risk Assessment/Calculations:           Physical Exam:    VS:  BP (!) 170/58    Pulse (!) 55    Ht '5\' 10"'$  (1.778 m)    Wt 174 lb 6.4 oz (79.1 kg)    SpO2 95%    BMI 25.02 kg/m     Wt Readings from Last 3 Encounters:  02/20/22 174 lb 6.4 oz (79.1 kg)  01/07/22 166 lb (75.3 kg)  01/03/22 163 lb 2 oz (74 kg)     GEN:  Well nourished, well developed in no acute distress HEENT: Normal NECK: No JVD; No carotid bruits LYMPHATICS: No lymphadenopathy CARDIAC: RRR, no murmurs, rubs, gallops RESPIRATORY:  Clear to auscultation without rales, wheezing or rhonchi  ABDOMEN: Soft, non-tender, non-distended MUSCULOSKELETAL:  No edema; No deformity  SKIN: Warm and dry NEUROLOGIC:  Alert and oriented x 3 PSYCHIATRIC:  Normal affect   ASSESSMENT:    1. Acute diastolic HF (heart failure) ([place])   2. Coronary artery disease involving coronary bypass graft of native heart without angina pectoris   3. Essential hypertension   4. Hyperlipidemia LDL goal <70   5. Chronic kidney disease (CKD), stage IV (severe) (HCC)    PLAN:    In order of problems listed above:  Acute on chronic diastolic heart failure: Patient is clearly gaining some of the fluid back.  I will increase the Lasix to 40 mg twice a day.  He will need basic metabolic panel in 1  week.  I plan to see the patient back in 2 to 3 weeks for reassessment.  CAD s/p CABG: Denies any chest pain  Hypertension: Blood pressure actually increased after addition of amlodipine last time.  Unfortunately his renal function does not allow me to add spironolactone.  Given the significant diuresis he will have in the next few weeks, I will hold off on adding additional blood pressure medication at this time.  We will reassess on follow-up  CKD stage IV: Repeat basic metabolic panel in 1 week given the higher dose of diuretic.           Medication Adjustments/Labs and Tests Ordered: Current medicines are reviewed at length with the patient today.  Concerns regarding medicines are outlined above.  Orders Placed This Encounter  Procedures   Basic metabolic panel   Meds ordered this encounter  Medications   furosemide (LASIX) 40 MG tablet    Sig: Take 1 tablet (40 mg total) by mouth 2 (two) times daily.    Dispense:  60 tablet    Refill:  3    Patient Instructions  Medication Instructions:  Increase Lasix 40 twice daily.   *If you need a refill on your cardiac medications before your next appointment, please call your pharmacy*   Lab Work: BMET (come back in 1 week, you do NOT have to fast for this, no lab  appointment needed)  If you have labs (blood work) drawn today and your tests are completely normal, you will receive your results only by: [place] (if you have MyChart) OR A paper copy in the mail If you have any lab test that is abnormal or we need to change your treatment, we will call you to review the results.   Follow-Up: At [facility], you and your health needs are our priority.  As part of our continuing mission to provide you with exceptional heart care, we have created designated Provider Care Teams.  These Care Teams include your primary Cardiologist (physician) and Advanced Practice Providers (APPs -  Physician Assistants and Nurse  Practitioners) who all work together to provide you with the care you need, when you need it.  We recommend signing up for the patient portal called "MyChart".  Sign up information is provided on this After Visit Summary.  MyChart is used to connect with patients for Virtual Visits (Telemedicine).  Patients are able to view lab/test results, encounter notes, upcoming appointments, etc.  Non-urgent messages can be sent to your provider as well.   To learn more about what you can do with MyChart, go to [email or web address].    Your next appointment:    March 30th at 1:30 PM   The format for your next appointment:   In Person  Provider:   Almyra Deforest, PA-C     Signed, Almyra Deforest, [place]  02/22/2022 3:35 PM    [place]

## 2022-02-20 NOTE — Patient Instructions (Signed)
Medication Instructions:  ?Increase Lasix 40 twice daily.  ? ?*If you need a refill on your cardiac medications before your next appointment, please call your pharmacy* ? ? ?Lab Work: ?BMET (come back in 1 week, you do NOT have to fast for this, no lab appointment needed) ? ?If you have labs (blood work) drawn today and your tests are completely normal, you will receive your results only by: ?MyChart Message (if you have MyChart) OR ?A paper copy in the mail ?If you have any lab test that is abnormal or we need to change your treatment, we will call you to review the results. ? ? ?Follow-Up: ?At [facility], you and your health needs are our priority.  As part of our continuing mission to provide you with exceptional heart care, we have created designated Provider Care Teams.  These Care Teams include your primary Cardiologist (physician) and Advanced Practice Providers (APPs -  Physician Assistants and Nurse Practitioners) who all work together to provide you with the care you need, when you need it. ? ?We recommend signing up for the patient portal called "MyChart".  Sign up information is provided on this After Visit Summary.  MyChart is used to connect with patients for Virtual Visits (Telemedicine).  Patients are able to view lab/test results, encounter notes, upcoming appointments, etc.  Non-urgent messages can be sent to your provider as well.   ?To learn more about what you can do with MyChart, go to [email or web address].   ? ?Your next appointment:   ? March 30th at 1:30 PM  ? ?The format for your next appointment:   ?In Person ? ?Provider:   ?Almyra Deforest, PA-C ? ? ?

## 2022-02-22 ENCOUNTER — Encounter: Payer: Self-pay | Admitting: Physician Assistant

## 2022-02-22 DIAGNOSIS — D631 Anemia in chronic kidney disease: Secondary | ICD-10-CM | POA: Diagnosis not present

## 2022-02-22 DIAGNOSIS — N184 Chronic kidney disease, stage 4 (severe): Secondary | ICD-10-CM | POA: Diagnosis not present

## 2022-02-25 ENCOUNTER — Other Ambulatory Visit: Payer: Self-pay | Admitting: Internal Medicine

## 2022-02-27 DIAGNOSIS — I5041 Acute combined systolic (congestive) and diastolic (congestive) heart failure: Secondary | ICD-10-CM | POA: Diagnosis not present

## 2022-02-28 LAB — BASIC METABOLIC PANEL
BUN/Creatinine Ratio: 13 (ref 10–24)
BUN: 38 mg/dL — ABNORMAL HIGH (ref 8–27)
CO2: 19 mmol/L — ABNORMAL LOW (ref 20–29)
Calcium: 8.6 mg/dL (ref 8.6–10.2)
Chloride: 101 mmol/L (ref 96–106)
Creatinine, Ser: 3.01 mg/dL — ABNORMAL HIGH (ref 0.76–1.27)
Glucose: 103 mg/dL — ABNORMAL HIGH (ref 70–99)
Potassium: 4 mmol/L (ref 3.5–5.2)
Sodium: 136 mmol/L (ref 134–144)
eGFR: 20 mL/min/{1.73_m2} — ABNORMAL LOW (ref >59)

## 2022-03-01 ENCOUNTER — Other Ambulatory Visit: Payer: Self-pay | Admitting: *Deleted

## 2022-03-01 DIAGNOSIS — N184 Chronic kidney disease, stage 4 (severe): Secondary | ICD-10-CM

## 2022-03-01 DIAGNOSIS — I5031 Acute diastolic (congestive) heart failure: Secondary | ICD-10-CM

## 2022-03-01 MED ORDER — FUROSEMIDE 40 MG PO TABS: 60.0000 mg | ORAL_TABLET | Freq: Every day | ORAL | 3 refills | Status: DC

## 2022-03-04 ENCOUNTER — Ambulatory Visit (INDEPENDENT_AMBULATORY_CARE_PROVIDER_SITE_OTHER): Payer: Medicare HMO | Admitting: Internal Medicine

## 2022-03-04 ENCOUNTER — Other Ambulatory Visit: Payer: Self-pay

## 2022-03-04 ENCOUNTER — Encounter: Payer: Self-pay | Admitting: Internal Medicine

## 2022-03-04 VITALS — BP 174/80 | HR 63 | Temp 98.1°F | Resp 16 | Ht 70.0 in | Wt 166.5 lb

## 2022-03-04 DIAGNOSIS — M79604 Pain in right leg: Secondary | ICD-10-CM | POA: Diagnosis not present

## 2022-03-04 DIAGNOSIS — Z809 Family history of malignant neoplasm, unspecified: Secondary | ICD-10-CM | POA: Diagnosis not present

## 2022-03-04 DIAGNOSIS — E039 Hypothyroidism, unspecified: Secondary | ICD-10-CM

## 2022-03-04 DIAGNOSIS — Z7982 Long term (current) use of aspirin: Secondary | ICD-10-CM | POA: Diagnosis not present

## 2022-03-04 DIAGNOSIS — I10 Essential (primary) hypertension: Secondary | ICD-10-CM

## 2022-03-04 DIAGNOSIS — I2510 Atherosclerotic heart disease of native coronary artery without angina pectoris: Secondary | ICD-10-CM | POA: Diagnosis not present

## 2022-03-04 DIAGNOSIS — E785 Hyperlipidemia, unspecified: Secondary | ICD-10-CM | POA: Diagnosis not present

## 2022-03-04 DIAGNOSIS — I129 Hypertensive chronic kidney disease with stage 1 through stage 4 chronic kidney disease, or unspecified chronic kidney disease: Secondary | ICD-10-CM | POA: Diagnosis not present

## 2022-03-04 DIAGNOSIS — N184 Chronic kidney disease, stage 4 (severe): Secondary | ICD-10-CM

## 2022-03-04 DIAGNOSIS — N189 Chronic kidney disease, unspecified: Secondary | ICD-10-CM | POA: Diagnosis not present

## 2022-03-04 DIAGNOSIS — K219 Gastro-esophageal reflux disease without esophagitis: Secondary | ICD-10-CM | POA: Diagnosis not present

## 2022-03-04 DIAGNOSIS — Z794 Long term (current) use of insulin: Secondary | ICD-10-CM | POA: Diagnosis not present

## 2022-03-04 DIAGNOSIS — E1122 Type 2 diabetes mellitus with diabetic chronic kidney disease: Secondary | ICD-10-CM | POA: Diagnosis not present

## 2022-03-04 DIAGNOSIS — Z23 Encounter for immunization: Secondary | ICD-10-CM | POA: Diagnosis not present

## 2022-03-04 DIAGNOSIS — Q8901 Asplenia (congenital): Secondary | ICD-10-CM

## 2022-03-04 DIAGNOSIS — Z7722 Contact with and (suspected) exposure to environmental tobacco smoke (acute) (chronic): Secondary | ICD-10-CM | POA: Diagnosis not present

## 2022-03-04 DIAGNOSIS — I252 Old myocardial infarction: Secondary | ICD-10-CM | POA: Diagnosis not present

## 2022-03-04 DIAGNOSIS — I5031 Acute diastolic (congestive) heart failure: Secondary | ICD-10-CM

## 2022-03-04 NOTE — Progress Notes (Signed)
? ?Subjective:  ? ? Patient ID: Ethan Mcneil, male    DOB: [birth year], 88 y.o.   MRN: [id number] ? ?DOS:  03/04/2022 ?Type of visit - description: f/u ? ?Here with his son. ?Today we talk about HTN, CHF, CRI.  Also thyroid disease and vaccinations. ?He reports has developed pain at the R leg, from the knee down (no knee pain per se). ?No claudication ?Pain is actually worse at night. ?No toe or skin discoloration.    ? ? ?Wt Readings from Last 3 Encounters:  ?03/04/22 166 lb 8 oz (75.5 kg)  ?02/20/22 174 lb 6.4 oz (79.1 kg)  ?01/07/22 166 lb (75.3 kg)  ? ? ?Review of Systems ?Denies chest pain. ?No nausea, vomiting, diarrhea.  Next ?Past Medical History:  ?Diagnosis Date  ? Anemia   ? Arthritis   ? CAD (coronary artery disease)   ? CABG 1997; grafts patent @ cath [facility] 11/09  ? DM2 (diabetes mellitus, type 2) ([place])   ? Gout   ? after tick bite  ? History of kidney stones   ? HLD (hyperlipidemia)   ? HTN (hypertension)   ? essential nos  ? Hypothyroid   ? Pancreatic lesion   ? Postsurgical aortocoronary bypass status   ? PVD (peripheral vascular disease) ([place])   ? S/P herniorrhaphy   ? Tick fever 2012  ? [facility] [place]  ? Unspecified hyperplasia of prostate without urinary obstruction and other lower urinary tract symptoms (LUTS)   ? Urolithiasis 2015  ? hx of  ? Wears dentures   ? Wears glasses   ? ? ?Past Surgical History:  ?Procedure Laterality Date  ? BIOPSY  08/07/2020  ? Procedure: BIOPSY;  Surgeon: Rush Landmark Telford Nab., MD;  Location: [place];  Service: Gastroenterology;;  ? CATARACT EXTRACTION Bilateral   ? CORONARY ARTERY BYPASS GRAFT    ? x7 vessels 1997; cath 2002 and 2009; grafts patent   ? CYSTOSCOPY WITH RETROGRADE PYELOGRAM, URETEROSCOPY AND STENT PLACEMENT Right 02/21/2014  ? Procedure: CYSTOSCOPY WITH RIGHT  RETROGRADE PYELOGRAM,RIGHT  URETEROSCOPY WITH STONE BASKETING EXTRACTION;  Surgeon: Irine Seal, MD;  [facility];  Service: Urology;  Laterality: Right;  ?  ESOPHAGOGASTRODUODENOSCOPY (EGD) WITH PROPOFOL N/A 08/07/2020  ? Procedure: ESOPHAGOGASTRODUODENOSCOPY (EGD) WITH PROPOFOL;  Surgeon: Rush Landmark Telford Nab., MD;  Location: [place];  Service: Gastroenterology;  Laterality: N/A;  ? EUS N/A 08/07/2020  ? Procedure: UPPER ENDOSCOPIC ULTRASOUND (EUS) LINEAR;  Surgeon: Rush Landmark Telford Nab., MD;  Location: [place];  Service: Gastroenterology;  Laterality: N/A;  ? FINE NEEDLE ASPIRATION N/A 08/07/2020  ? Procedure: FINE NEEDLE ASPIRATION (FNA) LINEAR;  Surgeon: Irving Copas., MD;  Location: [place];  Service: Gastroenterology;  Laterality: N/A;  ? HERNIA REPAIR    ? INGUINAL HERNIA REPAIR    ? MULTIPLE TOOTH EXTRACTIONS    ? SHOULDER SURGERY    ? SPLENECTOMY, TOTAL N/A 10/26/2020  ? Procedure: SPLENECTOMY;  Surgeon: Stark Klein, MD;  Location: [place];  Service: General;  Laterality: N/A;  ? ? ?Current Outpatient Medications  ?Medication Instructions  ? acetaminophen (TYLENOL) 500 mg, Oral, Daily at bedtime  ? amLODipine (NORVASC) 5 MG tablet TAKE 1 AND 1/2 TABLETS DAILY  ? aspirin EC 81 mg, Oral, Daily at bedtime  ? B-12 1,000 mcg, Oral, Daily  ? B-D UF III MINI PEN NEEDLES 31G X 5 MM MISC USE 3 TIMES A DAY AS DIRECTED  ? Basaglar KwikPen 6 Units, Subcutaneous, Daily  ? Continuous Blood Gluc  Receiver (FREESTYLE LIBRE 2 READER) DEVI Use as instructed to check blood sugar  ? Continuous Blood Gluc Sensor (FREESTYLE LIBRE 14 DAY SENSOR) MISC USE 1 SENSOR EVERY 14 (FOURTEEN) DAYS.  ? FREESTYLE LITE test strip CHECK BLOOD SUGAR NO MORE THAN TWICE DAILY.  ? furosemide (LASIX) 60 mg, Oral, Daily  ? hydrALAZINE (APRESOLINE) 100 mg, Oral, 3 times daily  ? insulin aspart (NOVOLOG FLEXPEN) 100 UNIT/ML FlexPen 3 times a day (just before each meal), 5-6-6 units.  ? levothyroxine (SYNTHROID) 150 MCG tablet TAKE 1 TABLET BY MOUTH DAILY BEFORE BREAKFAST.  ? metoprolol succinate (TOPROL-XL) 50 mg, Oral, Daily, TAKE WITH OR IMMEDIATELY FOLLOWING A MEAL.  ?  nitroGLYCERIN (NITROSTAT) 0.4 mg, Sublingual, Every 5 min x3 PRN  ? omeprazole (PRILOSEC) 40 mg, Oral, Daily  ? Pancrelipase, Lip-Prot-Amyl, (ZENPEP) 25000-79000 units CPEP Take 2 capsules by mouth with every meal and snacks  ? simvastatin (ZOCOR) 20 MG tablet TAKE 1 TABLET BY MOUTH EVERYDAY AT BEDTIME  ? sodium bicarbonate 650 mg, Oral, 2 times daily  ? Vitamin D3 2,000 Units, Oral, Daily  ? ? ?   ?Objective:  ? Physical Exam ?BP (!) 174/80 (BP Location: Left Arm, Patient Position: Sitting, Cuff Size: Small)   Pulse 63   Temp 98.1 ?F (36.7 ?C) (Oral)   Resp 16   Ht '5\' 10"'$  (1.778 m)   Wt 166 lb 8 oz (75.5 kg)   SpO2 94%   BMI 23.89 kg/m?  ?General:   ?Well developed, NAD, BMI noted. ?HEENT:  ?Normocephalic . Face symmetric, atraumatic ?Lungs:  ?CTA B ?Normal respiratory effort, no intercostal retractions, no accessory muscle use. ?Heart: RRR,  no murmur.  ?Lower extremities: Noticeable pitting edema, L>R ?Patient remove his compression stocking from the right leg, good pedal pulse, no discoloration, no redness or cellulitis. ?Skin: Not pale. Not jaundice ?Neurologic:  ?alert & oriented X3.  ?Speech normal, gait appropriate for age and unassisted ?Psych--  ?Cognition and judgment appear intact.  ?Cooperative with normal attention span and concentration.  ?Behavior appropriate. ?No anxious or depressed appearing.  ? ?   ?Assessment   ?Assessment ?DM -- w/ CAD, no neuropathy, intolerant to metformin , sees ENDO ?HTN ?Hyperkalemia-- off ? ACEs ?Hyperlipidemia ?Hypothyroidism ?DJD ?BPH, LUTS,h/o urolithiasis -- sees urology ?CKD: started to see renal 06/2020 ?Anemia  ?CV: ?--CAD, CABG 1997, cardiac cath 2009 ?--PVD (no ABIs in the chart) ?--L leg edema, chronic, (-) DVT per [place] 2015 ?Gout ?GI: ?Pancreatic insufficiency dx 02-2017 ?B 12 deficiency, mild 2015  ?Wt loss:  ?2012 187 lb, 2014 173 lb , 2015 167 ?01-2017: EGD normal, BX chronic active gastritis. Colonoscopy normal. ?Saw GI 02-2017: likely d/t Pancreatic  insufficiency and poorly controlled diabetes. 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  Surgeries 10-2020 ?Asplenia ?Covid dx 05/2021  ? ? ?PLAN: ?DM: Per Endo.  Well-controlled ?HTN: Cardiology noted BP was elevated few days ago  spironolactone was not that due to kidney function but Lasix was increased.  See below. ?He also takes amlodipine 5 mg, hydralazine 100 mg 3 times daily and metoprolol.  Heart rate 63. ?BP needs better control,  asked patient to see nephrology, referral sent ?CHF, CAD ?Saw cardiology 02/20/22, at the time he was gaining weight, Lasix dose increased to twice daily.  Subsequent creatinine came back slightly elevated at 3.0, Lasix was decreased from 40 mg twice daily to 60  mg daily. ?No evidence of pulmonary edema today.Marland Kitchen ?Asplenia: Meningococcus shot #2 today, Bexsero No. 1 today. ?Hypothyroidism: Check TSH ?Anemia: Follow-up by renal. ?Right leg pain: No evidence of claudication, related to lymphedema?  Recommend observation for now, Tylenol.  Also, consider use compression stockings for less hours every day to see if that help with pain. ?RTC 3 to 4 months ?  ? ?This visit occurred during the SARS-CoV-2 public health emergency.  Safety protocols were in place, including screening questions prior to the visit, additional usage of staff PPE, and extensive cleaning of exam room while observing appropriate contact time as indicated for disinfecting solutions.  ? ?

## 2022-03-04 NOTE — Patient Instructions (Addendum)
Your BP remains elevated.  Please call nephrology (renal doctor) for further advice. ?Check the  blood pressure regularly ?BP GOAL is between 110/65 and  135/85. ?If it is consistently higher or lower, let me know ? ? Tylenol  500 mg OTC 2 tabs a day every 8 hours as needed for pain ? ? ?GO TO THE LAB : Get the blood work   ? ? ?[place], [place] ?Come back for   your second Bexsero (a type of meningitis vaccine) in 1 month please make a nurse appointment ? ?Come back for a check up in 3-4 months  ?

## 2022-03-04 NOTE — Assessment & Plan Note (Signed)
DM: Per Endo.  Well-controlled ?HTN: Cardiology noted BP was elevated few days ago  spironolactone was not that due to kidney function but Lasix was increased.  See below. ?He also takes amlodipine 5 mg, hydralazine 100 mg 3 times daily and metoprolol.  Heart rate 63. ?BP needs better control,  asked patient to see nephrology, referral sent ?CHF, CAD ?Saw cardiology 02/20/22, at the time he was gaining weight, Lasix dose increased to twice daily.  Subsequent creatinine came back slightly elevated at 3.0, Lasix was decreased from 40 mg twice daily to 60 mg daily. ?No evidence of pulmonary edema today.Marland Kitchen ?Asplenia: Meningococcus shot #2 today, Bexsero No. 1 today. ?Hypothyroidism: Check TSH ?Anemia: Follow-up by renal. ?Right leg pain: No evidence of claudication, related to lymphedema?  Recommend observation for now, Tylenol.  Also, consider use compression stockings for less hours every day to see if that help with pain. ?RTC 3 to 4 months ?

## 2022-03-11 DIAGNOSIS — D631 Anemia in chronic kidney disease: Secondary | ICD-10-CM | POA: Diagnosis not present

## 2022-03-11 DIAGNOSIS — N184 Chronic kidney disease, stage 4 (severe): Secondary | ICD-10-CM | POA: Diagnosis not present

## 2022-03-14 ENCOUNTER — Encounter: Payer: Self-pay | Admitting: Physician Assistant

## 2022-03-14 ENCOUNTER — Ambulatory Visit: Payer: Medicare HMO | Admitting: Physician Assistant

## 2022-03-14 VITALS — BP 188/68 | HR 58 | Ht 70.0 in | Wt 172.0 lb

## 2022-03-14 DIAGNOSIS — N184 Chronic kidney disease, stage 4 (severe): Secondary | ICD-10-CM | POA: Diagnosis not present

## 2022-03-14 DIAGNOSIS — I25810 Atherosclerosis of coronary artery bypass graft(s) without angina pectoris: Secondary | ICD-10-CM | POA: Diagnosis not present

## 2022-03-14 DIAGNOSIS — E039 Hypothyroidism, unspecified: Secondary | ICD-10-CM

## 2022-03-14 DIAGNOSIS — I5032 Chronic diastolic (congestive) heart failure: Secondary | ICD-10-CM

## 2022-03-14 DIAGNOSIS — E785 Hyperlipidemia, unspecified: Secondary | ICD-10-CM

## 2022-03-14 DIAGNOSIS — I10 Essential (primary) hypertension: Secondary | ICD-10-CM

## 2022-03-14 MED ORDER — ISOSORBIDE MONONITRATE ER 60 MG PO TB24: 60.0000 mg | ORAL_TABLET | Freq: Every day | ORAL | 3 refills | Status: DC

## 2022-03-14 NOTE — Patient Instructions (Addendum)
Medication Instructions:  ?START Imdur 60 mg daily ? ?*If you need a refill on your cardiac medications before your next appointment, please call your pharmacy* ? ?Lab Work: ?Your physician recommends that you return for lab work TODAY:  ?BMET ? ?If you have labs (blood work) drawn today and your tests are completely normal, you will receive your results only by: ?MyChart Message (if you have MyChart) OR ?A paper copy in the mail ?If you have any lab test that is abnormal or we need to change your treatment, we will call you to review the results. ? ?Testing/Procedures: ?NONE ordered at this time of appointment  ? ?Follow-Up: ?At [facility], you and your health needs are our priority.  As part of our continuing mission to provide you with exceptional heart care, we have created designated Provider Care Teams.  These Care Teams include your primary Cardiologist (physician) and Advanced Practice Providers (APPs -  Physician Assistants and Nurse Practitioners) who all work together to provide you with the care you need, when you need it. ? ?We recommend signing up for the patient portal called "MyChart".  Sign up information is provided on this After Visit Summary.  MyChart is used to connect with patients for Virtual Visits (Telemedicine).  Patients are able to view lab/test results, encounter notes, upcoming appointments, etc.  Non-urgent messages can be sent to your provider as well.   ?To learn more about what you can do with MyChart, go to [email or web address].   ? ?Your next appointment:   ?3 month(s) ? ?The format for your next appointment:   ?In Person ? ?Provider:   ?Minus Breeding, MD   ? ?Other Instructions ? ?

## 2022-03-14 NOTE — Progress Notes (Signed)
?Cardiology Office Note:   ? ?Date:  03/16/2022  ? ?ID:  Ethan Mcneil, DOB 03/09/1934, MRN [id number] ? ?PCP:  Colon Branch, MD ?  ?[facility] Providers ?Cardiologist:  Minus Breeding, MD { ?Nephrologist: Dr. Carolin Sicks ? ?Referring MD: Colon Branch, MD  ? ?Chief Complaint  ?Patient presents with  ? Follow-up  ? ? ?History of Present Illness:   ? ?Ethan Mcneil is a 88 y.o. male with a hx of CAD s/p CABG 1997, CKD IV, HTN, HLD, hypothyroidism, and intraductal papillary mucinous neoplasm of pancreas.  Last cardiac catheterization in 2009 revealed patent grafts.  Patient was seen urgently at the recommendation of his PCP on 12/28/2021 for worsening shortness of breath and leg edema.  Patient was seen previously in the emergency room for shortness of breath in December 2022.  BNP at the time was 1900.  Creatinine 2.5.  He received 40 mg IV Lasix which made him feel better.  He was seen by Dr. Marlou Porch on 12/28/2021, he was felt to be massively volume overloaded at the time.  Patient was directly admitted to the hospital.  Echocardiogram obtained on 12/29/2021 showed EF 55%, mild LVH, grade 2 DD, RVSP 44.2 mmHg, mild to moderate MR, moderate to severe TR.   ? ?I last saw the patient on 02/20/2022, he had had at least 2-3+ pitting edema on physical exam.  His weight also increased from 166 pounds to 174 pounds.  I increased his Lasix to 40 mg twice a day.  His lung was clear.  His blood pressure was elevated at that time, I plan for reassessment today.  Due to worsening renal function, I reduced his Lasix from 40 mg twice daily down to 60 mg daily on 02/27/2022. ? ?Patient presents today for follow-up.  He continues to have significant lower extremity edema.  Lower extremity edema is better in the morning and worse by night follow-up.  I am unable to uptitrate the diuretic.  I will obtain a basic metabolic panel.  He will need to control leg edema for which identification.  Blood pressure remains quite high.  188/68.  His  blood pressure has been in the 160s at home.  I recommend the addition of Imdur 60 mg daily.  If this does not help with his blood pressure, alternative medication include Cardura may also be used in the future.  Unable to add ACE inhibitor/ARB or spironolactone given poor renal function.  He will need to see his nephrologist. ? ? ?Past Medical History:  ?Diagnosis Date  ? Anemia   ? Arthritis   ? CAD (coronary artery disease)   ? CABG 1997; grafts patent @ cath [facility] 11/09  ? DM2 (diabetes mellitus, type 2) ([place])   ? Gout   ? after tick bite  ? History of kidney stones   ? HLD (hyperlipidemia)   ? HTN (hypertension)   ? essential nos  ? Hypothyroid   ? Pancreatic lesion   ? Postsurgical aortocoronary bypass status   ? PVD (peripheral vascular disease) ([place])   ? S/P herniorrhaphy   ? Tick fever 2012  ? [facility] , [place]  ? Unspecified hyperplasia of prostate without urinary obstruction and other lower urinary tract symptoms (LUTS)   ? Urolithiasis 2015  ? hx of  ? Wears dentures   ? Wears glasses   ? ? ?Past Surgical History:  ?Procedure Laterality Date  ? BIOPSY  08/07/2020  ? Procedure: BIOPSY;  Surgeon: Irving Copas.,  MD;  Location: [place];  Service: Gastroenterology;;  ? CATARACT EXTRACTION Bilateral   ? CORONARY ARTERY BYPASS GRAFT    ? x7 vessels 1997; cath 2002 and 2009; grafts patent   ? CYSTOSCOPY WITH RETROGRADE PYELOGRAM, URETEROSCOPY AND STENT PLACEMENT Right 02/21/2014  ? Procedure: CYSTOSCOPY WITH RIGHT  RETROGRADE PYELOGRAM,RIGHT  URETEROSCOPY WITH STONE BASKETING EXTRACTION;  Surgeon: Irine Seal, MD;  Location: [facility];  Service: Urology;  Laterality: Right;  ? ESOPHAGOGASTRODUODENOSCOPY (EGD) WITH PROPOFOL N/A 08/07/2020  ? Procedure: ESOPHAGOGASTRODUODENOSCOPY (EGD) WITH PROPOFOL;  Surgeon: Rush Landmark Telford Nab., MD;  Location: [place];  Service: Gastroenterology;  Laterality: N/A;  ? EUS N/A 08/07/2020  ? Procedure: UPPER ENDOSCOPIC ULTRASOUND (EUS) LINEAR;   Surgeon: Rush Landmark Telford Nab., MD;  Location: [place];  Service: Gastroenterology;  Laterality: N/A;  ? FINE NEEDLE ASPIRATION N/A 08/07/2020  ? Procedure: FINE NEEDLE ASPIRATION (FNA) LINEAR;  Surgeon: Irving Copas., MD;  Location: [place];  Service: Gastroenterology;  Laterality: N/A;  ? HERNIA REPAIR    ? INGUINAL HERNIA REPAIR    ? MULTIPLE TOOTH EXTRACTIONS    ? SHOULDER SURGERY    ? SPLENECTOMY, TOTAL N/A 10/26/2020  ? Procedure: SPLENECTOMY;  Surgeon: Stark Klein, MD;  Location: [place];  Service: General;  Laterality: N/A;  ? ? ?Current Medications: ?Current Meds  ?Medication Sig  ? acetaminophen (TYLENOL) 500 MG tablet Take 500 mg by mouth at bedtime.  ? amLODipine (NORVASC) 5 MG tablet TAKE 1 AND 1/2 TABLETS DAILY  ? aspirin EC 81 MG tablet Take 81 mg by mouth at bedtime.  ? B-D UF III MINI PEN NEEDLES 31G X 5 MM MISC USE 3 TIMES A DAY AS DIRECTED  ? Cholecalciferol (VITAMIN D3) 50 MCG (2000 UT) capsule Take 2,000 Units by mouth daily.  ? Continuous Blood Gluc Receiver (FREESTYLE LIBRE 2 READER) DEVI Use as instructed to check blood sugar  ? Continuous Blood Gluc Sensor (FREESTYLE LIBRE 14 DAY SENSOR) MISC USE 1 SENSOR EVERY 14 (FOURTEEN) DAYS.  ? Cyanocobalamin (B-12) 1000 MCG TABS Take 1,000 mcg by mouth daily.  ? FREESTYLE LITE test strip CHECK BLOOD SUGAR NO MORE THAN TWICE DAILY.  ? furosemide (LASIX) 40 MG tablet Take 1.5 tablets (60 mg total) by mouth daily.  ? hydrALAZINE (APRESOLINE) 100 MG tablet Take 100 mg by mouth 3 (three) times daily.  ? insulin aspart (NOVOLOG FLEXPEN) 100 UNIT/ML FlexPen 3 times a day (just before each meal), 5-6-6 units. (Patient taking differently: 6 Units 3 (three) times daily with meals.)  ? Insulin Glargine (BASAGLAR KWIKPEN) 100 UNIT/ML Inject 6 Units into the skin daily.  ? isosorbide mononitrate (IMDUR) 60 MG 24 hr tablet Take 1 tablet (60 mg total) by mouth daily.  ? levothyroxine (SYNTHROID) 150 MCG tablet TAKE 1 TABLET BY MOUTH DAILY  BEFORE BREAKFAST.  ? metoprolol succinate (TOPROL-XL) 50 MG 24 hr tablet Take 1 tablet (50 mg total) by mouth daily. TAKE WITH OR IMMEDIATELY FOLLOWING A MEAL.  ? nitroGLYCERIN (NITROSTAT) 0.4 MG SL tablet Place 1 tablet (0.4 mg total) under the tongue every 5 (five) minutes x 3 doses as needed for chest pain.  ? omeprazole (PRILOSEC) 40 MG capsule Take 1 capsule (40 mg total) by mouth daily.  ? Pancrelipase, Lip-Prot-Amyl, (ZENPEP) 25000-79000 units CPEP Take 2 capsules by mouth with every meal and snacks (Patient taking differently: Take 1 capsule by mouth daily at 12 noon.)  ? simvastatin (ZOCOR) 20 MG tablet TAKE 1 TABLET BY MOUTH EVERYDAY AT BEDTIME  ?  sodium bicarbonate 650 MG tablet Take 650 mg by mouth 2 (two) times daily.  ?  ? ?Allergies:   Ciprofloxacin and Codeine  ? ?Social History  ? ?Socioeconomic History  ? Marital status: Married  ?  Spouse name: [name]  ? Number of children: 4  ? Years of education: Not on file  ? Highest education level: Not on file  ?Occupational History  ? Occupation: retired, works few hours   ?Tobacco Use  ? Smoking status: Never  ? Smokeless tobacco: Never  ?Vaping Use  ? Vaping Use: Never used  ?Substance and Sexual Activity  ? Alcohol use: No  ? Drug use: No  ? Sexual activity: Not Currently  ?Other Topics Concern  ? Not on file  ?Social History Narrative  ? Buyer, retail and former race Nutritional therapist  ? 4 children (boys)  ? Designated party form signed appointing wife - [name]; ok to leave msg on home phone [phone]  ?   ?   ? ?Social Determinants of Health  ? ?Financial Resource Strain: Low Risk   ? Difficulty of Paying Living Expenses: Not hard at all  ?Food Insecurity: No Food Insecurity  ? Worried About Charity fundraiser in the Last Year: Never true  ? Ran Out of Food in the Last Year: Never true  ?Transportation Needs: No Transportation Needs  ? Lack of Transportation (Medical): No  ? Lack of Transportation (Non-Medical): No  ?Physical Activity: Insufficiently  Active  ? Days of Exercise per Week: 5 days  ? Minutes of Exercise per Session: 20 min  ?Stress: No Stress Concern Present  ? Feeling of Stress : Only a little  ?Social Connections: Moderately Integrated  ? Glori Luis

## 2022-03-15 LAB — BASIC METABOLIC PANEL
BUN/Creatinine Ratio: 13 (ref 10–24)
BUN: 32 mg/dL — ABNORMAL HIGH (ref 8–27)
CO2: 17 mmol/L — ABNORMAL LOW (ref 20–29)
Calcium: 8.4 mg/dL — ABNORMAL LOW (ref 8.6–10.2)
Chloride: 100 mmol/L (ref 96–106)
Creatinine, Ser: 2.49 mg/dL — ABNORMAL HIGH (ref 0.76–1.27)
Glucose: 245 mg/dL — ABNORMAL HIGH (ref 70–99)
Potassium: 4 mmol/L (ref 3.5–5.2)
Sodium: 139 mmol/L (ref 134–144)
eGFR: 25 mL/min/{1.73_m2} — ABNORMAL LOW (ref >59)

## 2022-03-25 ENCOUNTER — Other Ambulatory Visit: Payer: Self-pay | Admitting: Internal Medicine

## 2022-03-25 DIAGNOSIS — N184 Chronic kidney disease, stage 4 (severe): Secondary | ICD-10-CM | POA: Diagnosis not present

## 2022-03-25 DIAGNOSIS — D631 Anemia in chronic kidney disease: Secondary | ICD-10-CM | POA: Diagnosis not present

## 2022-04-02 ENCOUNTER — Other Ambulatory Visit: Payer: Self-pay | Admitting: Family Medicine

## 2022-04-02 DIAGNOSIS — I10 Essential (primary) hypertension: Secondary | ICD-10-CM

## 2022-04-04 ENCOUNTER — Inpatient Hospital Stay (HOSPITAL_BASED_OUTPATIENT_CLINIC_OR_DEPARTMENT_OTHER)
Admission: EM | Admit: 2022-04-04 | Discharge: 2022-04-08 | DRG: 193 | Disposition: A | Discharge status: Home / Self Care | Payer: Medicare HMO | Attending: Internal Medicine | Admitting: Internal Medicine

## 2022-04-04 ENCOUNTER — Encounter: Payer: Self-pay | Admitting: Internal Medicine

## 2022-04-04 ENCOUNTER — Ambulatory Visit (INDEPENDENT_AMBULATORY_CARE_PROVIDER_SITE_OTHER): Payer: Medicare HMO | Admitting: Internal Medicine

## 2022-04-04 ENCOUNTER — Ambulatory Visit: Payer: Medicare HMO

## 2022-04-04 ENCOUNTER — Other Ambulatory Visit: Payer: Self-pay

## 2022-04-04 ENCOUNTER — Emergency Department (HOSPITAL_BASED_OUTPATIENT_CLINIC_OR_DEPARTMENT_OTHER): Payer: Medicare HMO

## 2022-04-04 ENCOUNTER — Encounter (HOSPITAL_BASED_OUTPATIENT_CLINIC_OR_DEPARTMENT_OTHER): Payer: Self-pay

## 2022-04-04 VITALS — BP 186/76 | HR 76 | Temp 97.9°F | Resp 18 | Ht 70.0 in | Wt 157.4 lb

## 2022-04-04 DIAGNOSIS — R059 Cough, unspecified: Secondary | ICD-10-CM | POA: Diagnosis not present

## 2022-04-04 DIAGNOSIS — E1151 Type 2 diabetes mellitus with diabetic peripheral angiopathy without gangrene: Secondary | ICD-10-CM | POA: Diagnosis not present

## 2022-04-04 DIAGNOSIS — E876 Hypokalemia: Secondary | ICD-10-CM | POA: Diagnosis not present

## 2022-04-04 DIAGNOSIS — N184 Chronic kidney disease, stage 4 (severe): Secondary | ICD-10-CM | POA: Diagnosis present

## 2022-04-04 DIAGNOSIS — I10 Essential (primary) hypertension: Secondary | ICD-10-CM | POA: Diagnosis present

## 2022-04-04 DIAGNOSIS — I2582 Chronic total occlusion of coronary artery: Secondary | ICD-10-CM | POA: Diagnosis not present

## 2022-04-04 DIAGNOSIS — Z8249 Family history of ischemic heart disease and other diseases of the circulatory system: Secondary | ICD-10-CM

## 2022-04-04 DIAGNOSIS — D631 Anemia in chronic kidney disease: Secondary | ICD-10-CM | POA: Diagnosis present

## 2022-04-04 DIAGNOSIS — M1990 Unspecified osteoarthritis, unspecified site: Secondary | ICD-10-CM | POA: Diagnosis present

## 2022-04-04 DIAGNOSIS — D649 Anemia, unspecified: Secondary | ICD-10-CM | POA: Diagnosis present

## 2022-04-04 DIAGNOSIS — E559 Vitamin D deficiency, unspecified: Secondary | ICD-10-CM | POA: Diagnosis present

## 2022-04-04 DIAGNOSIS — I160 Hypertensive urgency: Secondary | ICD-10-CM | POA: Diagnosis present

## 2022-04-04 DIAGNOSIS — I5033 Acute on chronic diastolic (congestive) heart failure: Secondary | ICD-10-CM | POA: Diagnosis present

## 2022-04-04 DIAGNOSIS — R0602 Shortness of breath: Secondary | ICD-10-CM | POA: Diagnosis not present

## 2022-04-04 DIAGNOSIS — E872 Acidosis, unspecified: Secondary | ICD-10-CM | POA: Diagnosis present

## 2022-04-04 DIAGNOSIS — Z79899 Other long term (current) drug therapy: Secondary | ICD-10-CM

## 2022-04-04 DIAGNOSIS — N400 Enlarged prostate without lower urinary tract symptoms: Secondary | ICD-10-CM | POA: Diagnosis not present

## 2022-04-04 DIAGNOSIS — J189 Pneumonia, unspecified organism: Secondary | ICD-10-CM

## 2022-04-04 DIAGNOSIS — I739 Peripheral vascular disease, unspecified: Secondary | ICD-10-CM | POA: Diagnosis present

## 2022-04-04 DIAGNOSIS — Z951 Presence of aortocoronary bypass graft: Secondary | ICD-10-CM

## 2022-04-04 DIAGNOSIS — K861 Other chronic pancreatitis: Secondary | ICD-10-CM | POA: Diagnosis present

## 2022-04-04 DIAGNOSIS — Z87442 Personal history of urinary calculi: Secondary | ICD-10-CM | POA: Diagnosis not present

## 2022-04-04 DIAGNOSIS — E782 Mixed hyperlipidemia: Secondary | ICD-10-CM | POA: Diagnosis present

## 2022-04-04 DIAGNOSIS — I130 Hypertensive heart and chronic kidney disease with heart failure and stage 1 through stage 4 chronic kidney disease, or unspecified chronic kidney disease: Secondary | ICD-10-CM | POA: Diagnosis not present

## 2022-04-04 DIAGNOSIS — I071 Rheumatic tricuspid insufficiency: Secondary | ICD-10-CM

## 2022-04-04 DIAGNOSIS — Z20822 Contact with and (suspected) exposure to covid-19: Secondary | ICD-10-CM | POA: Diagnosis not present

## 2022-04-04 DIAGNOSIS — E039 Hypothyroidism, unspecified: Secondary | ICD-10-CM | POA: Diagnosis not present

## 2022-04-04 DIAGNOSIS — I5043 Acute on chronic combined systolic (congestive) and diastolic (congestive) heart failure: Secondary | ICD-10-CM | POA: Diagnosis present

## 2022-04-04 DIAGNOSIS — I088 Other rheumatic multiple valve diseases: Secondary | ICD-10-CM | POA: Diagnosis present

## 2022-04-04 DIAGNOSIS — D849 Immunodeficiency, unspecified: Secondary | ICD-10-CM | POA: Diagnosis present

## 2022-04-04 DIAGNOSIS — I248 Other forms of acute ischemic heart disease: Secondary | ICD-10-CM | POA: Diagnosis present

## 2022-04-04 DIAGNOSIS — K8689 Other specified diseases of pancreas: Secondary | ICD-10-CM | POA: Diagnosis present

## 2022-04-04 DIAGNOSIS — J90 Pleural effusion, not elsewhere classified: Secondary | ICD-10-CM | POA: Diagnosis not present

## 2022-04-04 DIAGNOSIS — Z794 Long term (current) use of insulin: Secondary | ICD-10-CM

## 2022-04-04 DIAGNOSIS — G479 Sleep disorder, unspecified: Secondary | ICD-10-CM | POA: Diagnosis present

## 2022-04-04 DIAGNOSIS — Z9081 Acquired absence of spleen: Secondary | ICD-10-CM | POA: Diagnosis present

## 2022-04-04 DIAGNOSIS — Z66 Do not resuscitate: Secondary | ICD-10-CM | POA: Diagnosis not present

## 2022-04-04 DIAGNOSIS — I2489 Other forms of acute ischemic heart disease: Secondary | ICD-10-CM

## 2022-04-04 DIAGNOSIS — R5381 Other malaise: Secondary | ICD-10-CM

## 2022-04-04 DIAGNOSIS — E1122 Type 2 diabetes mellitus with diabetic chronic kidney disease: Secondary | ICD-10-CM | POA: Diagnosis present

## 2022-04-04 DIAGNOSIS — I2510 Atherosclerotic heart disease of native coronary artery without angina pectoris: Secondary | ICD-10-CM | POA: Diagnosis present

## 2022-04-04 DIAGNOSIS — Z881 Allergy status to other antibiotic agents status: Secondary | ICD-10-CM

## 2022-04-04 DIAGNOSIS — Z833 Family history of diabetes mellitus: Secondary | ICD-10-CM | POA: Diagnosis not present

## 2022-04-04 DIAGNOSIS — Z7982 Long term (current) use of aspirin: Secondary | ICD-10-CM

## 2022-04-04 DIAGNOSIS — R5383 Other fatigue: Secondary | ICD-10-CM | POA: Diagnosis not present

## 2022-04-04 DIAGNOSIS — R7989 Other specified abnormal findings of blood chemistry: Secondary | ICD-10-CM | POA: Diagnosis present

## 2022-04-04 DIAGNOSIS — I25118 Atherosclerotic heart disease of native coronary artery with other forms of angina pectoris: Secondary | ICD-10-CM | POA: Diagnosis present

## 2022-04-04 DIAGNOSIS — E785 Hyperlipidemia, unspecified: Secondary | ICD-10-CM | POA: Diagnosis present

## 2022-04-04 DIAGNOSIS — Z885 Allergy status to narcotic agent status: Secondary | ICD-10-CM

## 2022-04-04 DIAGNOSIS — E538 Deficiency of other specified B group vitamins: Secondary | ICD-10-CM | POA: Diagnosis present

## 2022-04-04 DIAGNOSIS — R9389 Abnormal findings on diagnostic imaging of other specified body structures: Secondary | ICD-10-CM

## 2022-04-04 DIAGNOSIS — I509 Heart failure, unspecified: Secondary | ICD-10-CM

## 2022-04-04 DIAGNOSIS — Z8719 Personal history of other diseases of the digestive system: Secondary | ICD-10-CM

## 2022-04-04 DIAGNOSIS — Z79890 Hormone replacement therapy (postmenopausal): Secondary | ICD-10-CM

## 2022-04-04 LAB — CBC WITH DIFFERENTIAL/PLATELET
Abs Immature Granulocytes: 0 10*3/uL (ref 0.00–0.07)
Basophils Absolute: 0 10*3/uL (ref 0.0–0.1)
Basophils Relative: 0 %
Eosinophils Absolute: 1.8 10*3/uL — ABNORMAL HIGH (ref 0.0–0.5)
Eosinophils Relative: 16 %
HCT: 36.4 % — ABNORMAL LOW (ref 39.0–52.0)
Hemoglobin: 12.2 g/dL — ABNORMAL LOW (ref 13.0–17.0)
Lymphocytes Relative: 14 %
Lymphs Abs: 1.6 10*3/uL (ref 0.7–4.0)
MCH: 30.3 pg (ref 26.0–34.0)
MCHC: 33.5 g/dL (ref 30.0–36.0)
MCV: 90.5 fL (ref 80.0–100.0)
Monocytes Absolute: 1.6 10*3/uL — ABNORMAL HIGH (ref 0.1–1.0)
Monocytes Relative: 14 %
Neutro Abs: 6.3 10*3/uL (ref 1.7–7.7)
Neutrophils Relative %: 56 %
Platelets: 332 10*3/uL (ref 150–400)
RBC: 4.02 MIL/uL — ABNORMAL LOW (ref 4.22–5.81)
RDW: 16.2 % — ABNORMAL HIGH (ref 11.5–15.5)
WBC: 11.3 10*3/uL — ABNORMAL HIGH (ref 4.0–10.5)
nRBC: 0 % (ref 0.0–0.2)

## 2022-04-04 LAB — BASIC METABOLIC PANEL
Anion gap: 12 (ref 5–15)
BUN: 41 mg/dL — ABNORMAL HIGH (ref 8–23)
CO2: 23 mmol/L (ref 22–32)
Calcium: 8.8 mg/dL — ABNORMAL LOW (ref 8.9–10.3)
Chloride: 100 mmol/L (ref 98–111)
Creatinine, Ser: 2.56 mg/dL — ABNORMAL HIGH (ref 0.61–1.24)
GFR, Estimated: 24 mL/min — ABNORMAL LOW (ref >60)
Glucose, Bld: 154 mg/dL — ABNORMAL HIGH (ref 70–99)
Potassium: 3.5 mmol/L (ref 3.5–5.1)
Sodium: 135 mmol/L (ref 135–145)

## 2022-04-04 LAB — TROPONIN I (HIGH SENSITIVITY)
Troponin I (High Sensitivity): 52 ng/L — ABNORMAL HIGH (ref <18)
Troponin I (High Sensitivity): 42 ng/L — ABNORMAL HIGH (ref <18)
Troponin I (High Sensitivity): 39 ng/L — ABNORMAL HIGH (ref <18)

## 2022-04-04 LAB — EXPECTORATED SPUTUM ASSESSMENT W GRAM STAIN, RFLX TO RESP C

## 2022-04-04 LAB — LACTIC ACID, PLASMA: Lactic Acid, Venous: 0.7 mmol/L (ref 0.5–1.9)

## 2022-04-04 LAB — RESP PANEL BY RT-PCR (FLU A&B, COVID) ARPGX2
Influenza A by PCR: NEGATIVE
Influenza B by PCR: NEGATIVE
SARS Coronavirus 2 by RT PCR: NEGATIVE

## 2022-04-04 LAB — BRAIN NATRIURETIC PEPTIDE: B Natriuretic Peptide: 710.8 pg/mL — ABNORMAL HIGH (ref 0.0–100.0)

## 2022-04-04 LAB — STREP PNEUMONIAE URINARY ANTIGEN: Strep Pneumo Urinary Antigen: NEGATIVE

## 2022-04-04 MED ORDER — AZITHROMYCIN 250 MG PO TABS
500.0000 mg | ORAL_TABLET | Freq: Every day | ORAL | Status: DC
  Administered 2022-04-05 – 2022-04-08 (×4): 500 mg via ORAL
  Filled 2022-04-04 (×4): qty 2

## 2022-04-04 MED ORDER — SODIUM CHLORIDE 0.9 % IV SOLN
2.0000 g | INTRAVENOUS | Status: DC
  Administered 2022-04-05 – 2022-04-08 (×4): 2 g via INTRAVENOUS
  Filled 2022-04-04 (×4): qty 20

## 2022-04-04 MED ORDER — HYDRALAZINE HCL 25 MG PO TABS
100.0000 mg | ORAL_TABLET | Freq: Three times a day (TID) | ORAL | Status: DC
  Administered 2022-04-04 – 2022-04-08 (×12): 100 mg via ORAL
  Filled 2022-04-04 (×11): qty 4

## 2022-04-04 MED ORDER — SODIUM BICARBONATE 650 MG PO TABS
650.0000 mg | ORAL_TABLET | Freq: Two times a day (BID) | ORAL | Status: DC
  Administered 2022-04-04 – 2022-04-08 (×8): 650 mg via ORAL
  Filled 2022-04-04 (×8): qty 1

## 2022-04-04 MED ORDER — INSULIN GLARGINE-YFGN 100 UNIT/ML ~~LOC~~ SOLN
7.0000 [IU] | Freq: Every day | SUBCUTANEOUS | Status: DC
  Administered 2022-04-04 – 2022-04-08 (×5): 7 [IU] via SUBCUTANEOUS
  Filled 2022-04-04 (×5): qty 0.07

## 2022-04-04 MED ORDER — PANTOPRAZOLE SODIUM 40 MG PO TBEC
40.0000 mg | DELAYED_RELEASE_TABLET | Freq: Every day | ORAL | Status: DC
Start: 2022-04-04 — End: 2022-04-08
  Administered 2022-04-04 – 2022-04-08 (×5): 40 mg via ORAL
  Filled 2022-04-04 (×5): qty 1

## 2022-04-04 MED ORDER — SODIUM CHLORIDE 0.9 % IV SOLN
1.0000 g | Freq: Once | INTRAVENOUS | Status: AC
  Administered 2022-04-04: 1 g via INTRAVENOUS
  Filled 2022-04-04: qty 10

## 2022-04-04 MED ORDER — HYDRALAZINE HCL 25 MG PO TABS: 25.0000 mg | ORAL_TABLET | Freq: Four times a day (QID) | ORAL | Status: DC | PRN

## 2022-04-04 MED ORDER — SIMVASTATIN 10 MG PO TABS
20.0000 mg | ORAL_TABLET | Freq: Every day | ORAL | Status: DC
Start: 2022-04-04 — End: 2022-04-08
  Administered 2022-04-04 – 2022-04-07 (×4): 20 mg via ORAL
  Filled 2022-04-04 (×4): qty 2

## 2022-04-04 MED ORDER — VITAMIN B-12 1000 MCG PO TABS
1000.0000 ug | ORAL_TABLET | Freq: Every day | ORAL | Status: DC
  Administered 2022-04-04 – 2022-04-08 (×5): 1000 ug via ORAL
  Filled 2022-04-04 (×5): qty 1

## 2022-04-04 MED ORDER — BASAGLAR KWIKPEN 100 UNIT/ML ~~LOC~~ SOPN: 6.0000 [IU] | PEN_INJECTOR | Freq: Every day | SUBCUTANEOUS | Status: DC

## 2022-04-04 MED ORDER — HEPARIN SODIUM (PORCINE) 5000 UNIT/ML IJ SOLN
5000.0000 [IU] | Freq: Three times a day (TID) | INTRAMUSCULAR | Status: DC
  Administered 2022-04-04 – 2022-04-08 (×11): 5000 [IU] via SUBCUTANEOUS
  Filled 2022-04-04 (×11): qty 1

## 2022-04-04 MED ORDER — FUROSEMIDE 40 MG PO TABS
60.0000 mg | ORAL_TABLET | Freq: Every day | ORAL | Status: DC
  Administered 2022-04-04: 60 mg via ORAL
  Filled 2022-04-04 (×2): qty 1

## 2022-04-04 MED ORDER — AZITHROMYCIN 250 MG PO TABS: 500.0000 mg | ORAL_TABLET | Freq: Once | ORAL | Status: DC

## 2022-04-04 MED ORDER — LABETALOL HCL 5 MG/ML IV SOLN: 5.0000 mg | Freq: Four times a day (QID) | INTRAVENOUS | Status: DC | PRN

## 2022-04-04 MED ORDER — ISOSORBIDE MONONITRATE ER 60 MG PO TB24
60.0000 mg | ORAL_TABLET | Freq: Every day | ORAL | Status: DC
  Administered 2022-04-04 – 2022-04-08 (×5): 60 mg via ORAL
  Filled 2022-04-04 (×5): qty 1

## 2022-04-04 MED ORDER — ACETAMINOPHEN 650 MG RE SUPP: 650.0000 mg | Freq: Four times a day (QID) | RECTAL | Status: DC | PRN

## 2022-04-04 MED ORDER — ONDANSETRON HCL 4 MG PO TABS: 4.0000 mg | ORAL_TABLET | Freq: Four times a day (QID) | ORAL | Status: DC | PRN

## 2022-04-04 MED ORDER — IPRATROPIUM BROMIDE 0.02 % IN SOLN
0.5000 mg | Freq: Once | RESPIRATORY_TRACT | Status: AC
  Administered 2022-04-04: 0.5 mg via RESPIRATORY_TRACT
  Filled 2022-04-04: qty 2.5

## 2022-04-04 MED ORDER — ACETAMINOPHEN 325 MG PO TABS: 650.0000 mg | ORAL_TABLET | Freq: Four times a day (QID) | ORAL | Status: DC | PRN

## 2022-04-04 MED ORDER — PANCRELIPASE (LIP-PROT-AMYL) 12000-38000 UNITS PO CPEP
24000.0000 [IU] | ORAL_CAPSULE | Freq: Every day | ORAL | Status: DC
  Administered 2022-04-04 – 2022-04-08 (×5): 24000 [IU] via ORAL
  Filled 2022-04-04 (×5): qty 2

## 2022-04-04 MED ORDER — VITAMIN D 25 MCG (1000 UNIT) PO TABS
2000.0000 [IU] | ORAL_TABLET | Freq: Every day | ORAL | Status: DC
  Administered 2022-04-04 – 2022-04-08 (×5): 2000 [IU] via ORAL
  Filled 2022-04-04 (×5): qty 2

## 2022-04-04 MED ORDER — ASPIRIN EC 81 MG PO TBEC
81.0000 mg | DELAYED_RELEASE_TABLET | Freq: Every day | ORAL | Status: DC
  Administered 2022-04-04 – 2022-04-07 (×4): 81 mg via ORAL
  Filled 2022-04-04 (×4): qty 1

## 2022-04-04 MED ORDER — PANCRELIPASE (LIP-PROT-AMYL) 25000-79000 UNITS PO CPEP: 1.0000 | ORAL_CAPSULE | Freq: Every day | ORAL | Status: DC

## 2022-04-04 MED ORDER — METOPROLOL SUCCINATE ER 50 MG PO TB24
50.0000 mg | ORAL_TABLET | Freq: Every day | ORAL | Status: DC
  Administered 2022-04-04 – 2022-04-07 (×4): 50 mg via ORAL
  Filled 2022-04-04 (×4): qty 1
  Filled 2022-04-04: qty 2

## 2022-04-04 MED ORDER — AMLODIPINE BESYLATE 5 MG PO TABS
5.0000 mg | ORAL_TABLET | Freq: Every day | ORAL | Status: DC
  Administered 2022-04-04 – 2022-04-05 (×2): 5 mg via ORAL
  Filled 2022-04-04 (×2): qty 1

## 2022-04-04 MED ORDER — FUROSEMIDE 10 MG/ML IJ SOLN
40.0000 mg | Freq: Once | INTRAMUSCULAR | Status: AC
  Administered 2022-04-04: 40 mg via INTRAVENOUS
  Filled 2022-04-04: qty 4

## 2022-04-04 MED ORDER — ONDANSETRON HCL 4 MG/2ML IJ SOLN: 4.0000 mg | Freq: Four times a day (QID) | INTRAMUSCULAR | Status: DC | PRN

## 2022-04-04 MED ORDER — SODIUM CHLORIDE 0.9 % IV SOLN
500.0000 mg | Freq: Once | INTRAVENOUS | Status: AC
  Administered 2022-04-04: 500 mg via INTRAVENOUS
  Filled 2022-04-04: qty 5

## 2022-04-04 MED ORDER — LEVOTHYROXINE SODIUM 150 MCG PO TABS
150.0000 ug | ORAL_TABLET | Freq: Every day | ORAL | Status: DC
  Administered 2022-04-05 – 2022-04-08 (×4): 150 ug via ORAL
  Filled 2022-04-04 (×4): qty 1

## 2022-04-04 MED ORDER — INSULIN ASPART 100 UNIT/ML IJ SOLN
0.0000 [IU] | Freq: Three times a day (TID) | INTRAMUSCULAR | Status: DC
  Administered 2022-04-05: 2 [IU] via SUBCUTANEOUS
  Administered 2022-04-05 (×2): 1 [IU] via SUBCUTANEOUS
  Administered 2022-04-06: 2 [IU] via SUBCUTANEOUS
  Administered 2022-04-06: 3 [IU] via SUBCUTANEOUS
  Administered 2022-04-06 – 2022-04-07 (×2): 2 [IU] via SUBCUTANEOUS
  Administered 2022-04-07: 3 [IU] via SUBCUTANEOUS
  Administered 2022-04-08: 1 [IU] via SUBCUTANEOUS
  Administered 2022-04-08: 3 [IU] via SUBCUTANEOUS

## 2022-04-04 MED ORDER — ALBUTEROL SULFATE (2.5 MG/3ML) 0.083% IN NEBU
5.0000 mg | INHALATION_SOLUTION | Freq: Once | RESPIRATORY_TRACT | Status: AC
  Administered 2022-04-04: 5 mg via RESPIRATORY_TRACT
  Filled 2022-04-04: qty 6

## 2022-04-04 NOTE — ED Triage Notes (Signed)
At primary care MD office, was being evaluated, has had low energy, fatigue, constant coughing. Onset approx 2 weeks ago. MD sent to ED for further evaluation of possible pneumonia ?

## 2022-04-04 NOTE — Patient Instructions (Addendum)
[place], I am sorry you are not feeling well today. ? ?Please proceed to the emergency room, they will do some testing and determine what is the best next step ?

## 2022-04-04 NOTE — ED Notes (Addendum)
1100: Patient resting quietly at this time. No acute distress noted. Patient currently on cardiac monitoring. Patient and patients son updated on plan of care. IV established and blood collected prior to RN's arrival. Will continue to monitor.  ? ?1330: Patient's second set of blood cultures drawn at this time per protocol. ABX hung, breathing treatment administered. Expiratory wheezing noted upon examination.  ? ?1515: Patient updated on plan of care. Medicated with home meds. Awaiting ready bed at [facility]. Will continue to monitor. ? ?1753: Patient repositioned for comfort. Given warm blankets. Lights dimmed. Awaiting transport from [facility] to [facility]. Audible expiratory wheeze is no long heard at this time and Patient states he is breathing easier.  ? ?1556: report given to [facility] RN at this time. Patient updated on plan of care. No acute distress noted.  ? ? ?

## 2022-04-04 NOTE — ED Provider Notes (Signed)
?[place] EMERGENCY DEPARTMENT ?Provider Note ? ? ?CSN: [id number] ?Arrival date & time: 04/04/22  1005 ? ?  ? ?History ? ?Chief Complaint  ?Patient presents with  ? Cough  ? ? ?Ethan Mcneil is a 88 y.o. male. ? ?Patient with history of congestive heart failure, chronic kidney disease, hypertension presents to the emergency department today after seeing his primary care doctor for evaluation of fatigue, cough, nasal congestion, shortness of breath.  Symptoms have been occurring over the past 2 weeks.  Went to PCP today for routine vaccines due to history of asplenia.  Was seen for his acute symptoms.  He family at bedside reports that he has been having difficulty sleeping due to difficulty with lying flat.  Patient has lower extremity swelling at baseline, does not feel that this is particularly worsened.  No chest pain or abdominal pain.  No vomiting or diarrhea.  No sore throat or ear pain.  He denies fever. ? ? ?  ? ?Home Medications ?Prior to Admission medications   ?Medication Sig Start Date End Date Taking? Authorizing Provider  ?acetaminophen (TYLENOL) 500 MG tablet Take 500 mg by mouth at bedtime.    [provider]  ?amLODipine (NORVASC) 5 MG tablet TAKE 1 AND 1/2 TABLETS DAILY 01/07/22   Minus Breeding, MD  ?aspirin EC 81 MG tablet Take 81 mg by mouth at bedtime.    [provider]  ?B-D UF III MINI PEN NEEDLES 31G X 5 MM MISC USE 3 TIMES A DAY AS DIRECTED 08/20/21   Renato Shin, MD  ?Cholecalciferol (VITAMIN D3) 50 MCG (2000 UT) capsule Take 2,000 Units by mouth daily.    [provider]  ?Continuous Blood Gluc Receiver (FREESTYLE LIBRE 2 READER) DEVI Use as instructed to check blood sugar 01/07/22   Renato Shin, MD  ?Continuous Blood Gluc Sensor (FREESTYLE LIBRE 14 DAY SENSOR) MISC USE 1 SENSOR EVERY 14 (FOURTEEN) DAYS. 01/07/22   Renato Shin, MD  ?Cyanocobalamin (B-12) 1000 MCG TABS Take 1,000 mcg by mouth daily.    [provider]  ?FREESTYLE LITE  test strip CHECK BLOOD SUGAR NO MORE THAN TWICE DAILY. 12/18/21   Renato Shin, MD  ?furosemide (LASIX) 40 MG tablet Take 1.5 tablets (60 mg total) by mouth daily. 03/25/22   Colon Branch, MD  ?hydrALAZINE (APRESOLINE) 100 MG tablet Take 100 mg by mouth 3 (three) times daily. 07/30/21   [provider]  ?insulin aspart (NOVOLOG FLEXPEN) 100 UNIT/ML FlexPen 3 times a day (just before each meal), 5-6-6 units. ?Patient taking differently: 6 Units 3 (three) times daily with meals. 12/25/21   Renato Shin, MD  ?Insulin Glargine [facility]) 100 UNIT/ML Inject 6 Units into the skin daily. 12/06/21   Renato Shin, MD  ?isosorbide mononitrate (IMDUR) 60 MG 24 hr tablet Take 1 tablet (60 mg total) by mouth daily. 03/14/22 06/12/22  Almyra Deforest, PA  ?levothyroxine (SYNTHROID) 150 MCG tablet TAKE 1 TABLET BY MOUTH DAILY BEFORE BREAKFAST. 02/25/22   Colon Branch, MD  ?metoprolol succinate (TOPROL-XL) 50 MG 24 hr tablet TAKE 1 TABLET BY MOUTH DAILY. TAKE WITH OR IMMEDIATELY FOLLOWING A MEAL. 04/02/22   Colon Branch, MD  ?nitroGLYCERIN (NITROSTAT) 0.4 MG SL tablet Place 1 tablet (0.4 mg total) under the tongue every 5 (five) minutes x 3 doses as needed for chest pain. ?Patient not taking: Reported on 04/04/2022 01/01/22   Margie Billet, PA-C  ?omeprazole (PRILOSEC) 40 MG capsule Take 1 capsule (40 mg total)  by mouth daily. 04/20/21   Shelda Pal, DO  ?Pancrelipase, Lip-Prot-Amyl, (ZENPEP) 25000-79000 units CPEP Take 2 capsules by mouth with every meal and snacks ?Patient taking differently: Take 1 capsule by mouth daily at 12 noon. 03/13/21   Colon Branch, MD  ?simvastatin (ZOCOR) 20 MG tablet TAKE 1 TABLET BY MOUTH EVERYDAY AT BEDTIME 12/06/21   Colon Branch, MD  ?sodium bicarbonate 650 MG tablet Take 650 mg by mouth 2 (two) times daily. 10/15/21   [provider]  ?   ? ?Allergies    ?Ciprofloxacin and Codeine   ? ?Review of Systems   ?Review of Systems ? ?Physical Exam ?Updated Vital Signs ?BP (!)  205/79 (BP Location: Right Arm)   Pulse 65   Temp 97.8 ?F (36.6 ?C) (Oral)   Resp 18   Ht '5\' 10"'$  (1.778 m)   Wt 71.3 kg   SpO2 95%   BMI 22.55 kg/m?  ?Physical Exam ?Vitals and nursing note reviewed.  ?Constitutional:   ?   General: He is not in acute distress. ?   Appearance: He is well-developed.  ?   Comments: Appears rundown but not in acute distress  ?HENT:  ?   Head: Normocephalic and atraumatic.  ?   Right Ear: External ear normal.  ?   Left Ear: External ear normal.  ?   Nose: Congestion present.  ?   Mouth/Throat:  ?   Mouth: Mucous membranes are moist.  ?Eyes:  ?   General:     ?   Right eye: No discharge.     ?   Left eye: No discharge.  ?   Conjunctiva/sclera: Conjunctivae normal.  ?Cardiovascular:  ?   Rate and Rhythm: Normal rate and regular rhythm.  ?   Heart sounds: Normal heart sounds.  ?Pulmonary:  ?   Effort: Pulmonary effort is normal.  ?   Breath sounds: Wheezing present.  ?   Comments: Mild expiratory wheezing all fields ?Abdominal:  ?   Palpations: Abdomen is soft.  ?   Tenderness: There is no abdominal tenderness. There is no guarding or rebound.  ?Musculoskeletal:  ?   Cervical back: Normal range of motion and neck supple.  ?   Right lower leg: Edema present.  ?   Left lower leg: Edema present.  ?   Comments: Compression on bilateral lower extremities.  ?Skin: ?   General: Skin is warm and dry.  ?Neurological:  ?   Mental Status: He is alert.  ? ? ?ED Results / Procedures / Treatments   ?Labs ?(all labs ordered are listed, but only abnormal results are displayed) ?Labs Reviewed  ?CBC WITH DIFFERENTIAL/PLATELET - Abnormal; Notable for the following components:  ?    Result Value  ? WBC 11.3 (*)   ? RBC 4.02 (*)   ? Hemoglobin 12.2 (*)   ? HCT 36.4 (*)   ? RDW 16.2 (*)   ? Monocytes Absolute 1.6 (*)   ? Eosinophils Absolute 1.8 (*)   ? All other components within normal limits  ?BASIC METABOLIC PANEL - Abnormal; Notable for the following components:  ? Glucose, Bld 154 (*)   ? BUN 41  (*)   ? Creatinine, Ser 2.56 (*)   ? Calcium 8.8 (*)   ? GFR, Estimated 24 (*)   ? All other components within normal limits  ?BRAIN NATRIURETIC PEPTIDE - Abnormal; Notable for the following components:  ? B Natriuretic Peptide 710.8 (*)   ? All  other components within normal limits  ?TROPONIN I (HIGH SENSITIVITY) - Abnormal; Notable for the following components:  ? Troponin I (High Sensitivity) 52 (*)   ? All other components within normal limits  ?RESP PANEL BY RT-PCR (FLU A&B, COVID) [id number]  ?CULTURE, BLOOD (ROUTINE X 2)  ?CULTURE, BLOOD (ROUTINE X 2)  ?LACTIC ACID, PLASMA  ?LACTIC ACID, PLASMA  ? ? ?ED ECG REPORT ? ? Date: 04/04/2022 ? Rate: 65 ? Rhythm: normal sinus rhythm ? QRS Axis: normal ? Intervals: normal ? ST/T Wave abnormalities: normal ? Conduction Disutrbances:none ? Narrative Interpretation:  ? Old EKG Reviewed: unchanged ? ?I have personally reviewed the EKG tracing and agree with the computerized printout as noted. ? ? ?Radiology ?DG Chest 2 View ? ?Result Date: 04/04/2022 ?CLINICAL DATA:  Shortness of breath, cough and fatigue. EXAM: CHEST - 2 VIEW COMPARISON:  Radiograph December 27, 2021 FINDINGS: Enlarged cardiac silhouette with central vascular prominence. Patchy opacities in the right lung. Small left pleural effusion. No visible pneumothorax. Prior median sternotomy and CABG. Thoracic spondylosis. IMPRESSION: Cardiomegaly with a small left pleural effusion and patchy opacities in the right lung may reflect asymmetric edema or infection. Electronically Signed   By: Dahlia Bailiff M.D.   On: 04/04/2022 12:19   ? ?Procedures ?Procedures  ? ? ?Medications Ordered in ED ?Medications - No data to display ? ?ED Course/ Medical Decision Making/ A&P ?  ? ?Patient seen and examined. History obtained directly from patient.  ? ?Labs/EKG: Ordered CBC, BMP, BNP, troponin.  COVID and flu testing. ? ?Imaging: Ordered chest x-ray. ? ?Medications/Fluids: None ordered. ? ?Most recent vital signs reviewed and are  as follows: ?BP (!) 205/79 (BP Location: Right Arm)   Pulse 65   Temp 97.8 ?F (36.6 ?C) (Oral)   Resp 18   Ht '5\' 10"'$  (1.778 m)   Wt 71.3 kg   SpO2 95%   BMI 22.55 kg/m?  ? ?Initial impression: Cough, wheezing. ? ?12

## 2022-04-04 NOTE — ED Notes (Signed)
[facility] [place] and Nursing assessment performed by M. Trudee Grip, RN ?

## 2022-04-04 NOTE — Progress Notes (Signed)
Plan of Care Note for accepted transfer ? ? ?Patient: Ethan Mcneil MRN: [id number]   DOA: 04/04/2022 ? ?Facility requesting transfer: [facility].Marland Kitchen ?Requesting Provider: Carlisle Cater, PA-C and Marda Stalker, MD ?Reason for transfer: Fatigue and frequent cough for the past 2 weeks. ?Facility course:  ?88 year old male with a past medical history of CAD, bradycardia, PVD, hypertension, hyperlipidemia, BPH, hypothyroidism chronic L LE edema, urolithiasis, B12 deficiency, vitamin D deficiency, pancreatic insufficiency type II DM, chronic combined systolic and diastolic HF, stage IV CKD, Asplenia after surgical procedure who presented to his PCP today to be vaccinated, but was subsequently sent to the emergency department for further evaluation of these symptoms.  He has received a 5 mg albuterol +0.5 mg ipratropium neb treatment, azithromycin 500 mg IVPB and ceftriaxone 1 g IVPB. ? ?Plan of care: ?The patient is accepted for admission to Telemetry unit, at [facility].  In the meantime, I have asked the ED provider to check the status of his antihypertensive medications last administration and treat his elevated BP. ? ?Author: ?Reubin Milan, MD ?04/04/2022 ? ?Check [email or web address] for on-call coverage. ? ?Nursing staff, Please call [place] number on Amion as soon as patient's arrival, so appropriate admitting provider can evaluate the pt. ?

## 2022-04-04 NOTE — H&P (Signed)
?History and Physical  ? ? ?Ethan Mcneil WNU:[id number] DOB: June 24, 1934 DOA: 04/04/2022 ? ?PCP: Colon Branch, MD  ? ?Patient coming from: [facility] ?Chief Complaint  ?Patient presents with  ? Cough  ?   ?HPI: Ethan Mcneil is a 88 y.o. 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  No vomiting diarrhea no sore throat or fever.  ? ?ED Course: Blood pressure on higher side in 205/79 on presentation, no tachypnea or tachycardia or hypoxia.  Labs showed creatinine 2.5 from 2.49 on 3/30, BNP 710, troponin 52, lactic acid 0.7, WBC 11.3, COVID screen negative, blood cultures sent. ?CXR: Cardiomegaly with a small left pleural effusion and patchy opacities in the right lung may reflect asymmetric edema or infection.  Patient was given IV antibiotics ceftriaxone/azithromycin, oral diuretics, bronchodilators and admission requested for further management ? ?Assessment/Plan ?Principal Problem: ?  CAP (community acquired pneumonia) ?Active Problems: ?  HYPERLIPIDEMIA ?  HTN (hypertension) ?  CAD (coronary artery disease) ?  PVD ?  Hypothyroidism ?  Anemia ?  Pancreatic insufficiency ?  Asplenia after surgical procedure ?  Chronic kidney disease, stage IV (severe) ([place]) ?  Acute on chronic combined systolic and diastolic CHF (congestive heart failure) ([place]) ?  Hypertensive urgency ?  Metabolic acidosis ? ?Community-acquired pneumonia ?Acute on chronic combined CHF: ?Presenting with fatigue cough orthopnea symptoms, likely multifactorial in the  setting of pneumonia and CHF.  We will keep on empiric antibiotics has asplenia, follow-up blood culture, check urinary antigens strep/Legionella.  At this time hemodynamically stable with uncontrolled blood pressure. Cont chf treatment as below. ? ?Chronic kidney disease, stage IV with metabolic acidosis: Creatinine holding at baseline monitor while on diuretics.  Continue home bicarb ? ?Acute on chronic combined systolic and diastolic CHF: Patient with chronic leg swelling but recently endorsing orthopnea symptoms, chest x-ray reviewed , will dose lasix 40 mg x1 and continue oral diuretic regimen and rest of the home meds monitor intake output and daily weight.  BNP is elevated 710.  Reviewed his echo from January this year G2 DD with EF 55%. ?Cardio consult in am. ? ?HLD ?CAD ?PVD: ?Slightly positive troponin we will trend troponin.  No chest pain.  Likely demand ischemia in the setting of uncontrolled hypertension.  Continue home aspirin 81, Imdur 60 as needed nitroglycerin, simvastatin ? ?Hypothyroidism:resume home synthroid. ? ?Hypertension urgency:blood pressure poorly controlled on admission.Will resume home meds amlodipine, metoprolol hydraalizine, lasix, imdur, add as needed prns meds po/iv.  ? ?Type 2 diabetes mellitus on 6 units Lantus daily.  Add SSI for now and resume long-acting insulin and monitor.  Last A1c stable 6.51/6/23 ?  ?History of bradycardia:monitor in tele ? ?Chronic anemia:monitor hh ? ?Pancreatic insufficiency-resume home enzymes. ? ?Asplenia after surgical procedure: Continue antibiotics as #1 monitor.  Follow-up blood culture ? ?Body mass index is 22.11 kg/m?.  ? ?Severity of Illness: ?The appropriate patient status for this patient is OBSERVATION. Observation status is judged to be reasonable and necessary in order to provide the required intensity of service to ensure the patient's safety. The patient's presenting symptoms,  physical exam findings, and initial radiographic and  laboratory data in the context of their medical condition is felt to place them at decreased risk for further clinical deterioration. Furthermore, it is anticipated that the patient will be medically stable for discharge from the hospital within 2 midnights of admission.   ? ?DVT prophylaxis: heparin injection 5,000 Units Start: 04/04/22 2200 ?Place TED hose Start: 04/04/22 1745 ?Code Status:   Code Status: DNR as per hwi wishes ?Family Communication: Admission, patients condition and plan of care including tests being ordered have been discussed with the patient  who indicate understanding and agree with the plan and Code Status. ? ?Consults called:  ?None ? ?Review of Systems: All systems were reviewed and were negative except as mentioned in HPI above. ?Negative for fever ?Negative for chest pain ?Negative for shortness of breath ? ?Past Medical History:  ?Diagnosis Date  ? Anemia   ? Arthritis   ? CAD (coronary artery disease)   ? CABG 1997; grafts patent @ cath [facility] 11/09  ? DM2 (diabetes mellitus, type 2) ([place])   ? Gout   ? after tick bite  ? History of kidney stones   ? HLD (hyperlipidemia)   ? HTN (hypertension)   ? essential nos  ? Hypothyroid   ? Pancreatic lesion   ? Postsurgical aortocoronary bypass status   ? PVD (peripheral vascular disease) ([place])   ? S/P herniorrhaphy   ? Tick fever 2012  ? [facility] , [place]  ? Unspecified hyperplasia of prostate without urinary obstruction and other lower urinary tract symptoms (LUTS)   ? Urolithiasis 2015  ? hx of  ? Wears dentures   ? Wears glasses   ? ? ?Past Surgical History:  ?Procedure Laterality Date  ? BIOPSY  08/07/2020  ? Procedure: BIOPSY;  Surgeon: Rush Landmark Telford Nab., MD;  Location: [place];  Service: Gastroenterology;;  ? CATARACT EXTRACTION Bilateral   ? CORONARY ARTERY BYPASS GRAFT    ? x7 vessels 1997; cath 2002 and 2009; grafts patent   ? CYSTOSCOPY WITH RETROGRADE PYELOGRAM, URETEROSCOPY AND STENT PLACEMENT Right 02/21/2014   ? Procedure: CYSTOSCOPY WITH RIGHT  RETROGRADE PYELOGRAM,RIGHT  URETEROSCOPY WITH STONE BASKETING EXTRACTION;  Surgeon: Irine Seal, MD;  Location: [facility];  Service: Urology;  Laterality: Right;  ? ESOPHAGOGASTRODUODENOSCOPY (EGD) WITH PROPOFOL N/A 08/07/2020  ? Procedure: ESOPHAGOGASTRODUODENOSCOPY (EGD) WITH PROPOFOL;  Surgeon: Rush Landmark Telford Nab., MD;  Location: [place];  Service: Gastroenterology;  Laterality: N/A;  ? EUS N/A 08/07/2020  ? Procedure: UPPER ENDOSCOPIC ULTRASOUND (EUS) LINEAR;  Surgeon: Rush Landmark Telford Nab., MD;  Location: [place];  Service: Gastroenterology;  Laterality: N/A;  ? FINE NEEDLE ASPIRATION N/A 08/07/2020  ? Procedure: FINE NEEDLE ASPIRATION (FNA) LINEAR;  Surgeon: Irving Copas., MD;  Location: [place];  Service: Gastroenterology;  Laterality: N/A;  ? HERNIA REPAIR    ? INGUINAL HERNIA REPAIR    ? MULTIPLE TOOTH EXTRACTIONS    ? SHOULDER SURGERY    ? SPLENECTOMY, TOTAL N/A 10/26/2020  ? Procedure: SPLENECTOMY;  Surgeon: Stark Klein, MD;  Location: [place];  Service: General;  Laterality: N/A;  ? ? ? reports that he has never smoked. He has never used smokeless tobacco. He reports that he does not drink alcohol and does not use drugs. ? ?Allergies  ?Allergen Reactions  ? Ciprofloxacin Itching  ? Codeine Nausea And Vomiting  ? ? ?Family History  ?Problem Relation Age of Onset  ? Lung cancer Father   ? Coronary artery disease  Mother   ?     stent  ? Diabetes Brother   ? Prostate cancer Maternal Uncle   ?     in his 2s  ? Stroke Son   ?     GF  ? Throat cancer Son   ? Colon cancer Neg Hx   ? Rectal cancer Neg Hx   ? Stomach cancer Neg Hx   ? Esophageal cancer Neg Hx   ? Pancreatic cancer Neg Hx   ? ? ? ?Prior to Admission medications   ?Medication Sig Start Date End Date Taking? Authorizing Provider  ?acetaminophen (TYLENOL) 500 MG tablet Take 500 mg by mouth every 4 (four) hours as needed for moderate pain.   Yes [provider]  ?amLODipine  (NORVASC) 5 MG tablet TAKE 1 AND 1/2 TABLETS DAILY ?Patient taking differently: Take 7 mg by mouth daily. 01/07/22  Yes Minus Breeding, MD  ?aspirin EC 81 MG tablet Take 81 mg by mouth at bedtime.   Yes Provider, Hi

## 2022-04-04 NOTE — Progress Notes (Signed)
? ?Subjective:  ? ? Patient ID: Ethan Mcneil, male    DOB: 05/05/1934, 88 y.o.   MRN: [id number] ? ?DOS:  04/04/2022 ?Type of visit - description: Acute here with his son. ? ?Patient came here for to get Bexsero vaccine however he reported respiratory symptoms and was seen acutely. ? ?Not feeling well for the last 2 weeks. ?Symptoms are gradually getting worse. ?+ Cough with gray sputum production, no hemoptysis ?Chest pain, mostly on the right side from cough ?Chest congestion, short of breath >>> more than usual. ?Some wheezing noted per patient. ?+ dizzines ?No recent sick contacts. ? ?Review of Systems ?Denies fevers ?Appetite is fair, not sure if he is drinking enough fluids. ?No nausea or vomiting ?+ Myalgias ?He has some orthopnea but denies any worsening of edema. ? ?Past Medical History:  ?Diagnosis Date  ? Anemia   ? Arthritis   ? CAD (coronary artery disease)   ? CABG 1997; grafts patent @ cath [facility] 11/09  ? DM2 (diabetes mellitus, type 2) ([place])   ? Gout   ? after tick bite  ? History of kidney stones   ? HLD (hyperlipidemia)   ? HTN (hypertension)   ? essential nos  ? Hypothyroid   ? Pancreatic lesion   ? Postsurgical aortocoronary bypass status   ? PVD (peripheral vascular disease) ([place])   ? S/P herniorrhaphy   ? Tick fever 2012  ? [facility] , [place]  ? Unspecified hyperplasia of prostate without urinary obstruction and other lower urinary tract symptoms (LUTS)   ? Urolithiasis 2015  ? hx of  ? Wears dentures   ? Wears glasses   ? ? ?Past Surgical History:  ?Procedure Laterality Date  ? BIOPSY  08/07/2020  ? Procedure: BIOPSY;  Surgeon: Rush Landmark Telford Nab., MD;  Location: [place];  Service: Gastroenterology;;  ? CATARACT EXTRACTION Bilateral   ? CORONARY ARTERY BYPASS GRAFT    ? x7 vessels 1997; cath 2002 and 2009; grafts patent   ? CYSTOSCOPY WITH RETROGRADE PYELOGRAM, URETEROSCOPY AND STENT PLACEMENT Right 02/21/2014  ? Procedure: CYSTOSCOPY WITH RIGHT  RETROGRADE  PYELOGRAM,RIGHT  URETEROSCOPY WITH STONE BASKETING EXTRACTION;  Surgeon: Irine Seal, MD;  Location[facility];  Service: Urology;  Laterality: Right;  ? ESOPHAGOGASTRODUODENOSCOPY (EGD) WITH PROPOFOL N/A 08/07/2020  ? Procedure: ESOPHAGOGASTRODUODENOSCOPY (EGD) WITH PROPOFOL;  Surgeon: Rush Landmark Telford Nab., MD;  Location: [place];  Service: Gastroenterology;  Laterality: N/A;  ? EUS N/A 08/07/2020  ? Procedure: UPPER ENDOSCOPIC ULTRASOUND (EUS) LINEAR;  Surgeon: Rush Landmark Telford Nab., MD;  Location: [place];  Service: Gastroenterology;  Laterality: N/A;  ? FINE NEEDLE ASPIRATION N/A 08/07/2020  ? Procedure: FINE NEEDLE ASPIRATION (FNA) LINEAR;  Surgeon: Irving Copas., MD;  Location: [place];  Service: Gastroenterology;  Laterality: N/A;  ? HERNIA REPAIR    ? INGUINAL HERNIA REPAIR    ? MULTIPLE TOOTH EXTRACTIONS    ? SHOULDER SURGERY    ? SPLENECTOMY, TOTAL N/A 10/26/2020  ? Procedure: SPLENECTOMY;  Surgeon: Stark Klein, MD;  Location: [place];  Service: General;  Laterality: N/A;  ? ? ?Current Outpatient Medications  ?Medication Instructions  ? acetaminophen (TYLENOL) 500 mg, Oral, Every 4 hours PRN  ? amLODipine (NORVASC) 5 MG tablet TAKE 1 AND 1/2 TABLETS DAILY  ? aspirin EC 81 mg, Oral, Daily at bedtime  ? B-12 1,000 mcg, Oral, Daily  ? B-D UF III MINI PEN NEEDLES 31G X 5 MM MISC USE 3 TIMES A DAY AS DIRECTED  ?  Basaglar KwikPen 6 Units, Subcutaneous, Daily  ? Continuous Blood Gluc Receiver (FREESTYLE LIBRE 2 READER) DEVI Use as instructed to check blood sugar  ? Continuous Blood Gluc Sensor (FREESTYLE LIBRE 14 DAY SENSOR) MISC USE 1 SENSOR EVERY 14 (FOURTEEN) DAYS.  ? FREESTYLE LITE test strip CHECK BLOOD SUGAR NO MORE THAN TWICE DAILY.  ? furosemide (LASIX) 60 mg, Oral, Daily  ? hydrALAZINE (APRESOLINE) 100 mg, Oral, 3 times daily  ? insulin aspart (NOVOLOG FLEXPEN) 100 UNIT/ML FlexPen 3 times a day (just before each meal), 5-6-6 units.  ? isosorbide mononitrate (IMDUR) 60 mg, Oral,  Daily  ? levothyroxine (SYNTHROID) 150 MCG tablet TAKE 1 TABLET BY MOUTH DAILY BEFORE BREAKFAST.  ? metoprolol succinate (TOPROL-XL) 50 MG 24 hr tablet TAKE 1 TABLET BY MOUTH DAILY. TAKE WITH OR IMMEDIATELY FOLLOWING A MEAL.  ? nitroGLYCERIN (NITROSTAT) 0.4 mg, Sublingual, Every 5 min x3 PRN  ? omeprazole (PRILOSEC) 40 mg, Oral, Daily  ? Pancrelipase, Lip-Prot-Amyl, (ZENPEP) 25000-79000 units CPEP Take 2 capsules by mouth with every meal and snacks  ? simvastatin (ZOCOR) 20 MG tablet TAKE 1 TABLET BY MOUTH EVERYDAY AT BEDTIME  ? sodium bicarbonate 650 mg, Oral, 2 times daily  ? Vitamin D3 2,000 Units, Oral, Daily  ? ? ?   ?Objective:  ? Physical Exam ?BP (!) 186/76 (BP Location: Left Arm, Patient Position: Sitting, Cuff Size: Small)   Pulse 76   Temp 97.9 ?F (36.6 ?C) (Oral)   Resp 18   Ht '5\' 10"'$  (1.778 m)   Wt 157 lb 6 oz (71.4 kg)   SpO2 92%   BMI 22.58 kg/m?  ?General:   ?Well developed, not toxic appearing or in acute distress however he looks very weak. ?HEENT:  ?Normocephalic . Face symmetric, atraumatic ?Lungs:  ?Few rhonchi, question of crackles at the right base ?Respiratory rate slightly elevated  ?heart: RRR,  no murmur.  ?Lower extremities: Chronic edema, worse on the left, seems at baseline ?Skin: Not pale. Not jaundice ?Neurologic:  ?alert & oriented X3.  ?Speech normal, gait appropriate for age and unassisted ?Psych--  ?Cognition and judgment appear intact.  ?Cooperative with normal attention span and concentration.  ?Behavior appropriate. ?No anxious or depressed appearing.  ? ?   ?Assessment   ? ?Assessment ?DM -- w/ CAD, no neuropathy, intolerant to metformin , sees ENDO ?HTN ?Hyperkalemia-- off ? ACEs ?Hyperlipidemia ?Hypothyroidism ?DJD ?BPH, LUTS,h/o urolithiasis -- sees urology ?CKD: started to see renal 06/2020 ?Anemia  ?CV: ?--CAD, CABG 1997, cardiac cath 2009 ?--PVD (no ABIs in the chart) ?--L leg edema, chronic, (-) DVT per [place] 2015 ?Gout ?GI: ?Pancreatic insufficiency dx 02-2017 ?B 12  deficiency, mild 2015  ?Wt loss:  ?2012 187 lb, 2014 173 lb , 2015 167 ?01-2017: EGD normal, BX chronic active gastritis. Colonoscopy normal. ?Saw GI 02-2017: likely d/t Pancreatic insufficiency and poorly controlled diabetes. 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  Surgeries 10-2020 ?Asplenia ?Covid dx 05/2021  ? ? ?PLAN: ?Pneumonia: ?Symptoms a started 2 weeks ago, on clinical grounds suspect pneumonia. ?He has multiple risk factors including age, DM, CKD, asplenia. ?Recommend a prompt evaluation to determine outpatient versus inpatient treatment. ?I spoke with the ER physician to agreed to see him, appreciate his help. ?CAD ?Saw cardiology 03/14/2022, he only takes Lasix 60 mg daily due to creatinine levels. ?CKD: Last BMP showed a creatinine of 2.4 (previously 3.0) ? ?  ? ?  This visit occurred during the SARS-CoV-2 public health emergency.  Safety protocols were in place, including screening questions prior to the visit, additional usage of staff PPE, and extensive cleaning of exam room while observing appropriate contact time as indicated for disinfecting solutions.  ? ?

## 2022-04-05 DIAGNOSIS — Z66 Do not resuscitate: Secondary | ICD-10-CM | POA: Diagnosis not present

## 2022-04-05 DIAGNOSIS — I25118 Atherosclerotic heart disease of native coronary artery with other forms of angina pectoris: Secondary | ICD-10-CM | POA: Diagnosis not present

## 2022-04-05 DIAGNOSIS — E872 Acidosis, unspecified: Secondary | ICD-10-CM | POA: Diagnosis not present

## 2022-04-05 DIAGNOSIS — I088 Other rheumatic multiple valve diseases: Secondary | ICD-10-CM | POA: Diagnosis not present

## 2022-04-05 DIAGNOSIS — D631 Anemia in chronic kidney disease: Secondary | ICD-10-CM | POA: Diagnosis not present

## 2022-04-05 DIAGNOSIS — I2510 Atherosclerotic heart disease of native coronary artery without angina pectoris: Secondary | ICD-10-CM

## 2022-04-05 DIAGNOSIS — I160 Hypertensive urgency: Secondary | ICD-10-CM

## 2022-04-05 DIAGNOSIS — I2583 Coronary atherosclerosis due to lipid rich plaque: Secondary | ICD-10-CM | POA: Diagnosis not present

## 2022-04-05 DIAGNOSIS — E039 Hypothyroidism, unspecified: Secondary | ICD-10-CM | POA: Diagnosis not present

## 2022-04-05 DIAGNOSIS — I248 Other forms of acute ischemic heart disease: Secondary | ICD-10-CM | POA: Diagnosis not present

## 2022-04-05 DIAGNOSIS — E1151 Type 2 diabetes mellitus with diabetic peripheral angiopathy without gangrene: Secondary | ICD-10-CM | POA: Diagnosis not present

## 2022-04-05 DIAGNOSIS — I5033 Acute on chronic diastolic (congestive) heart failure: Secondary | ICD-10-CM

## 2022-04-05 DIAGNOSIS — E1122 Type 2 diabetes mellitus with diabetic chronic kidney disease: Secondary | ICD-10-CM | POA: Diagnosis not present

## 2022-04-05 DIAGNOSIS — J189 Pneumonia, unspecified organism: Secondary | ICD-10-CM | POA: Diagnosis not present

## 2022-04-05 DIAGNOSIS — I5043 Acute on chronic combined systolic (congestive) and diastolic (congestive) heart failure: Secondary | ICD-10-CM | POA: Diagnosis not present

## 2022-04-05 DIAGNOSIS — N184 Chronic kidney disease, stage 4 (severe): Secondary | ICD-10-CM | POA: Diagnosis not present

## 2022-04-05 DIAGNOSIS — R5381 Other malaise: Secondary | ICD-10-CM

## 2022-04-05 DIAGNOSIS — I071 Rheumatic tricuspid insufficiency: Secondary | ICD-10-CM

## 2022-04-05 DIAGNOSIS — E785 Hyperlipidemia, unspecified: Secondary | ICD-10-CM | POA: Diagnosis not present

## 2022-04-05 DIAGNOSIS — I509 Heart failure, unspecified: Secondary | ICD-10-CM

## 2022-04-05 DIAGNOSIS — Z833 Family history of diabetes mellitus: Secondary | ICD-10-CM | POA: Diagnosis not present

## 2022-04-05 DIAGNOSIS — E876 Hypokalemia: Secondary | ICD-10-CM

## 2022-04-05 DIAGNOSIS — K861 Other chronic pancreatitis: Secondary | ICD-10-CM | POA: Diagnosis not present

## 2022-04-05 DIAGNOSIS — Z87442 Personal history of urinary calculi: Secondary | ICD-10-CM | POA: Diagnosis not present

## 2022-04-05 DIAGNOSIS — E782 Mixed hyperlipidemia: Secondary | ICD-10-CM | POA: Diagnosis not present

## 2022-04-05 DIAGNOSIS — R059 Cough, unspecified: Secondary | ICD-10-CM | POA: Diagnosis not present

## 2022-04-05 DIAGNOSIS — D849 Immunodeficiency, unspecified: Secondary | ICD-10-CM | POA: Diagnosis not present

## 2022-04-05 DIAGNOSIS — Z8249 Family history of ischemic heart disease and other diseases of the circulatory system: Secondary | ICD-10-CM | POA: Diagnosis not present

## 2022-04-05 DIAGNOSIS — I2582 Chronic total occlusion of coronary artery: Secondary | ICD-10-CM | POA: Diagnosis not present

## 2022-04-05 DIAGNOSIS — N400 Enlarged prostate without lower urinary tract symptoms: Secondary | ICD-10-CM | POA: Diagnosis not present

## 2022-04-05 DIAGNOSIS — I130 Hypertensive heart and chronic kidney disease with heart failure and stage 1 through stage 4 chronic kidney disease, or unspecified chronic kidney disease: Secondary | ICD-10-CM | POA: Diagnosis not present

## 2022-04-05 DIAGNOSIS — Z20822 Contact with and (suspected) exposure to covid-19: Secondary | ICD-10-CM | POA: Diagnosis not present

## 2022-04-05 DIAGNOSIS — N179 Acute kidney failure, unspecified: Secondary | ICD-10-CM | POA: Diagnosis not present

## 2022-04-05 LAB — CBC
HCT: 33.7 % — ABNORMAL LOW (ref 39.0–52.0)
Hemoglobin: 10.7 g/dL — ABNORMAL LOW (ref 13.0–17.0)
MCH: 29.4 pg (ref 26.0–34.0)
MCHC: 31.8 g/dL (ref 30.0–36.0)
MCV: 92.6 fL (ref 80.0–100.0)
Platelets: 334 10*3/uL (ref 150–400)
RBC: 3.64 MIL/uL — ABNORMAL LOW (ref 4.22–5.81)
RDW: 16 % — ABNORMAL HIGH (ref 11.5–15.5)
WBC: 9.9 10*3/uL (ref 4.0–10.5)
nRBC: 0 % (ref 0.0–0.2)

## 2022-04-05 LAB — GLUCOSE, CAPILLARY
Glucose-Capillary: 159 mg/dL — ABNORMAL HIGH (ref 70–99)
Glucose-Capillary: 226 mg/dL — ABNORMAL HIGH (ref 70–99)
Glucose-Capillary: 181 mg/dL — ABNORMAL HIGH (ref 70–99)
Glucose-Capillary: 157 mg/dL — ABNORMAL HIGH (ref 70–99)

## 2022-04-05 LAB — BASIC METABOLIC PANEL
Anion gap: 9 (ref 5–15)
BUN: 45 mg/dL — ABNORMAL HIGH (ref 8–23)
CO2: 25 mmol/L (ref 22–32)
Calcium: 8.1 mg/dL — ABNORMAL LOW (ref 8.9–10.3)
Chloride: 102 mmol/L (ref 98–111)
Creatinine, Ser: 2.41 mg/dL — ABNORMAL HIGH (ref 0.61–1.24)
GFR, Estimated: 26 mL/min — ABNORMAL LOW (ref >60)
Glucose, Bld: 153 mg/dL — ABNORMAL HIGH (ref 70–99)
Potassium: 3.4 mmol/L — ABNORMAL LOW (ref 3.5–5.1)
Sodium: 136 mmol/L (ref 135–145)

## 2022-04-05 LAB — LEGIONELLA PNEUMOPHILA SEROGP 1 UR AG: L. pneumophila Serogp 1 Ur Ag: NEGATIVE

## 2022-04-05 LAB — TSH: TSH: 1.785 u[IU]/mL (ref 0.350–4.500)

## 2022-04-05 MED ORDER — FUROSEMIDE 10 MG/ML IJ SOLN: 40.0000 mg | Freq: Three times a day (TID) | INTRAMUSCULAR | Status: DC

## 2022-04-05 MED ORDER — GUAIFENESIN-DM 100-10 MG/5ML PO SYRP
5.0000 mL | ORAL_SOLUTION | ORAL | Status: DC | PRN
  Administered 2022-04-05 – 2022-04-06 (×2): 5 mL via ORAL
  Filled 2022-04-05 (×2): qty 5

## 2022-04-05 MED ORDER — FUROSEMIDE 10 MG/ML IJ SOLN: 40.0000 mg | Freq: Every day | INTRAMUSCULAR | Status: DC

## 2022-04-05 MED ORDER — FUROSEMIDE 10 MG/ML IJ SOLN
40.0000 mg | Freq: Two times a day (BID) | INTRAMUSCULAR | Status: DC
  Administered 2022-04-05: 40 mg via INTRAVENOUS
  Filled 2022-04-05: qty 4

## 2022-04-05 MED ORDER — FUROSEMIDE 10 MG/ML IJ SOLN
80.0000 mg | Freq: Two times a day (BID) | INTRAMUSCULAR | Status: DC
  Administered 2022-04-05: 80 mg via INTRAVENOUS
  Filled 2022-04-05: qty 8

## 2022-04-05 MED ORDER — AMLODIPINE BESYLATE 5 MG PO TABS
5.0000 mg | ORAL_TABLET | Freq: Once | ORAL | Status: AC
  Administered 2022-04-05: 5 mg via ORAL
  Filled 2022-04-05: qty 1

## 2022-04-05 MED ORDER — AMLODIPINE BESYLATE 10 MG PO TABS
10.0000 mg | ORAL_TABLET | Freq: Every day | ORAL | Status: DC
  Administered 2022-04-06 – 2022-04-08 (×3): 10 mg via ORAL
  Filled 2022-04-05 (×3): qty 1

## 2022-04-05 MED ORDER — POTASSIUM CHLORIDE CRYS ER 20 MEQ PO TBCR
40.0000 meq | EXTENDED_RELEASE_TABLET | Freq: Once | ORAL | Status: AC
  Administered 2022-04-05: 40 meq via ORAL
  Filled 2022-04-05: qty 2

## 2022-04-05 MED ORDER — FUROSEMIDE 10 MG/ML IJ SOLN
40.0000 mg | Freq: Once | INTRAMUSCULAR | Status: AC
Start: 2022-04-05 — End: 2022-04-05
  Administered 2022-04-05: 40 mg via INTRAVENOUS
  Filled 2022-04-05: qty 4

## 2022-04-05 NOTE — Hospital Course (Addendum)
88 y.o. 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  No vomiting diarrhea no sore throat or fever.  ?  ?ED Course: Blood pressure on higher side in 205/79 on presentation, no tachypnea or tachycardia or hypoxia.  Labs showed creatinine 2.5 from 2.49 on 3/30, BNP 710, troponin 52, lactic acid 0.7, WBC 11.3, COVID screen negative, blood cultures sent. ?CXR: Cardiomegaly with a small left pleural effusion and patchy opacities in the right lung may reflect asymmetric edema or infection.  Patient was given IV antibiotics ceftriaxone/azithromycin, oral diuretics, bronchodilators and admission requested for further management.  Patient being managed for acute on chronic heart failure along with pneumonia, improving on diuresis seen by cardiology ?

## 2022-04-05 NOTE — Progress Notes (Signed)
?PROGRESS NOTE ?Ethan Mcneil  QQP:[id number] DOB: 1934/04/27 DOA: 04/04/2022 ?PCP: Colon Branch, MD  ? ?Brief Narrative/Hospital Course: ? 88 y.o. 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  No vomiting diarrhea no sore throat or fever.  ?  ?ED Course: Blood pressure on higher side in 205/79 on presentation, no tachypnea or tachycardia or hypoxia.  Labs showed creatinine 2.5 from 2.49 on 3/30, BNP 710, troponin 52, lactic acid 0.7, WBC 11.3, COVID screen negative, blood cultures sent. ?CXR: Cardiomegaly with a small left pleural effusion and patchy opacities in the right lung may reflect asymmetric edema or infection.  Patient was given IV antibiotics ceftriaxone/azithromycin, oral diuretics, bronchodilators and admission requested for further management  ?  ?Subjective: ?Seen this am ?Overnight afebrile on room air blood pressure improving ?Labs with improved WBC count, potassium low creatinine stable ?Assessment and Plan: ?Principal Problem: ?  CAP (community acquired pneumonia) ?Active Problems: ?  HYPERLIPIDEMIA ?  HTN (hypertension) ?  CAD (coronary artery disease) ?  PVD ?  Hypothyroidism ?  Anemia ?  Pancreatic insufficiency ?  Asplenia after surgical procedure ?  Chronic kidney disease, stage IV (severe) ([place]) ?  Acute on chronic diastolic CHF (congestive heart failure) ([place]) ?  Hypertensive urgency ?  Metabolic acidosis ?  Hypokalemia ?  Demand ischemia ([place]) ?  Tricuspid regurgitation ?  Physical deconditioning ?   ?Community-acquired pneumonia ?Acute on chronic diastolic CHF: ?Still with edematous leg, cough but improving leukocytosis resolved.  Continue with empiric ceftriaxone and azithromycin in the setting of immunocompromised status with asplenia.  Continue diuretics as below encourage IS and ambulation ? ?Acute on chronic  diastolic CHF ?TR moderate to severe ?MR mild to moderate ?Pulmonary regurgitation moderate: ?Patient with chronic leg swelling but recently endorsing orthopnea symptoms, chest x-ray reviewed -s/p  iv lasix 40 mg x1 and will switch p.o. to IV Lasix -at 80 mg iv biud We will continue to work on blood pressure optimization, cardiology consulted.  Echo from January this year G2 DD with EF 55%. ?Net IO Since Admission: -899.39 mL [04/05/22 1100]  ?Filed Weights  ? 04/04/22 1020 04/04/22 1714 04/05/22 0659  ?Weight: 71.3 kg 69.9 kg 71.1 kg  ?  ?Chronic kidney disease, stage IV with metabolic acidosis: Creatinine holding stable continue to monitor.  Continue oral bicarbonate.   ?Recent Labs  ?Lab 04/04/22 ?1036 04/05/22 ?0457  ?BUN 41* 45*  ?CREATININE 2.56* 2.41*  ?  ?HLD ?CAD ?PVD: ?Demand ischemia with elevated troponin: ?slightly positive troponin downtrending, no chest pain.  Continue home aspirin Imdur simvastatin.   ?  ?Hypothyroidism: cont home synthroid. ?  ?Hypertension urgency: BP poorly controlled in 200 on admission likely contributing to his symptoms-BP improving continue with multiple home regimen with amlodipine, metoprolol hydraalizine, lasix, imdur, add as needed prns meds po/iv.  ?  ?Type 2 diabetes mellitus: Well-controlled on home regimen, SSI.Last A1c stable 6.51/6/23 ?Recent Labs  ?Lab 04/05/22 ?0738  ?GLUCAP 159*  ?  ?History of bradycardia:monitor in tele ?  ?Chronic anemia:monitor hh-stable. ?  ?Pancreatic insufficiency-continue pancreatic enzymes.  ?  ?Asplenia hx  Continue antibiotics as #1 monitor.  Follow-up blood culture ? ?Hypokalemia being repleted orally. ? ?Deconditioning  debility lives alone wife passed away, two-person assist, PT OT consult ? ?DVT prophylaxis: heparin injection 5,000 Units Start: 04/04/22 2200 ?Place TED hose Start: 04/04/22 1745 ?Code Status:   Code Status: DNR ?Family Communication: plan of care discussed with patient at bedside. ?Patient status is: Inpatient level of care: Telemetry  ?Remains inpatient because: For ongoing management of pneumonia and congestive heart failure exacerbation ?Patient currently not stable ? ?Dispo: The patient is from: Home ?           Anticipated disposition: Home ? ?Mobility Assessment (last 72 hours)   ? ? Mobility Assessment   ? ? [place] Name 04/05/22 0900 04/04/22 1920 04/04/22 17:14:40  ?  ?  ? Does patient have an order for bedrest or is patient medically unstable No - Continue assessment No - Continue assessment No - Continue assessment    ? What is the highest level of mobility based on the progressive mobility assessment? Level 5 (Walks with assist in room/hall) - Balance while stepping forward/back and can walk in room with assist - Complete Level 5 (Walks with assist in room/hall) - Balance while stepping forward/back and can walk in room with assist - Complete Level 5 (Walks with assist in room/hall) - Balance while stepping forward/back and can walk in room with assist - Complete    ? ?  ?  ? ?  ?  ? ?Objective: ?Vitals last 24 hrs: ?Vitals:  ? 04/05/22 0100 04/05/22 0609 04/05/22 0659 04/05/22 1004  ?BP: (!) 137/52 (!) 169/63  (!) 161/74  ?Pulse: 75 64  70  ?Resp: 20 18    ?Temp: 98.9 ?F (37.2 ?C) 98.6 ?F (37 ?C)    ?TempSrc: Oral Oral    ?SpO2: 96% 94%    ?Weight:   71.1 kg   ?Height:      ? ?Weight change:  ? ?Physical Examination: ?General exam: AA,older than stated age, weak appearing. ?HEENT:Oral mucosa moist, Ear/Nose WNL grossly, dentition normal. ?Respiratory system: bilaterally crackles at the bases, no use of accessory muscle ?Cardiovascular system: S1 & S2 +, No JVD,. ?Gastrointestinal system: Abdomen  soft,NT,ND, BS+ ?Nervous System:Alert, awake, moving extremities and grossly nonfocal ?Extremities: LE edema +++,distal peripheral pulses palpable.  ?Skin: No rashes,no icterus. ?MSK: Normal muscle bulk,tone, power ? ?Medications reviewed: Scheduled Meds: ? [START ON 04/06/2022] amLODipine  10 mg Oral Daily  ? aspirin EC  81 mg Oral QHS  ? azithromycin  500 mg Oral Daily  ? cholecalciferol  2,000 Units Oral Daily  ? furosemide  80 mg Intravenous BID  ? heparin  5,000 Units Subcutaneous Q8H  ? hydrALAZINE  100 mg Oral TID  ? insulin aspart  0-6 Units Subcutaneous TID WC  ? insulin glargine-yfgn  7 Units Subcutaneous Daily  ? isosorbide mononitrate  60 mg Oral Daily  ? levothyroxine  150 mcg Oral QAC breakfast  ? lipase/protease/amylase  24,000 Units Oral Daily  ? metoprolol succinate  50 mg Oral Daily  ? pantoprazole  40 mg Oral Daily  ? simvastatin  20 mg Oral QHS  ? sodium bicarbonate  650 mg Oral BID  ? vitamin B-12  1,000 mcg Oral Daily  ? ?Continuous Infusions: ? cefTRIAXone (ROCEPHIN)  IV 2 g (04/05/22 1018)  ? ? ?  ?Diet Order   ? ?       ?  Diet heart healthy/carb modified Room service appropriate? Yes; Fluid consistency: Thin  Diet effective now       ?  ? ?  ?  ? ?  ?  ? ?  ?  ?  ? ? ?Intake/Output Summary (Last 24 hours) at 04/05/2022 1100 ?Last data filed at 04/05/2022 1048 ?Gross per 24 hour  ?Intake 350.61 ml  ?Output 1250 ml  ?Net -899.39 ml  ? ?Net IO Since Admission: -899.39 mL [04/05/22 1100]  ?Wt Readings from Last 3 Encounters:  ?04/05/22 71.1 kg  ?04-21-22 71.4 kg  ?03/14/22 78 kg  ?  ? ?Unresulted Labs (From admission, onward)  ? ?  Start     Ordered  ? 04/05/22 5631  Basic metabolic panel  Daily,   R     ? 04-21-22 1740  ? 04/21/2022 1745  Legionella Pneumophila Serogp 1 Ur Ag  (COPD / Pneumonia / Cellulitis / Lower Extremity Wound)  Once,   R       ? 04-21-2022 1745  ? ?  ?  ? ?  ?Data Reviewed: I have personally reviewed following labs and imaging studies ?CBC: ?Recent Labs  ?Lab 2022-04-21 ?1036  04/05/22 ?0457  ?WBC 11.3* 9.9  ?NEUTROABS 6.3  --   ?HGB 12.2* 10.7*  ?HCT 36.4* 33.7*  ?MCV 90.5 92.6  ?PLT 332 334  ? ?Basic Metabolic Panel: ?Recent Labs  ?Lab Apr 21, 2022 ?1036 04/05/22 ?0457  ?NA 135 136  ?K 3.5 3

## 2022-04-05 NOTE — Assessment & Plan Note (Signed)
Pneumonia: ?Symptoms a started 2 weeks ago, on clinical grounds suspect pneumonia. ?He has multiple risk factors including age, DM, CKD, asplenia. ?Recommend a prompt evaluation to determine outpatient versus inpatient treatment. ?I spoke with the ER physician to agreed to see him, appreciate his help. ?CAD ?Saw cardiology 03/14/2022, he only takes Lasix 60 mg daily due to creatinine levels. ?CKD: Last BMP showed a creatinine of 2.4 (previously 3.0) ?

## 2022-04-05 NOTE — Evaluation (Signed)
Physical Therapy Evaluation ?Patient Details ?Name: Ethan Mcneil ?MRN: [id number] ?DOB: Dec 31, 1933 ?Today's Date: 04/05/2022 ? ?History of Present Illness ? 88 y.o. 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 Dx of acute on chronic CHF.  ?Clinical Impression ? Pt admitted with above diagnosis. Pt ambulated 38' without an assistive device, with mild unsteadiness x 3 but no loss of balance. Pt reported his legs "feel weak". SaO2 95% on room air walking. I expect he'll be able to DC home without f/u PT.  Pt currently with functional limitations due to the deficits listed below (see PT Problem List). Pt will benefit from skilled PT to increase their independence and safety with mobility to allow discharge to the venue listed below.   ?   ?   ? ?Recommendations for follow up therapy are one component of a multi-disciplinary discharge planning process, led by the attending physician.  Recommendations may be updated based on patient status, additional functional criteria and insurance authorization. ? ?Follow Up Recommendations none ? ?  ?Assistance Recommended at Discharge Set up Supervision/Assistance  ?Patient can return home with the following ?   ? ?  ?Equipment Recommendations None recommended by PT  ?Recommendations for Other Services ?    ?  ?Functional Status Assessment Patient has had a recent decline in their functional status and demonstrates the ability to make significant improvements in function in a reasonable and predictable amount of time.  ? ?  ?Precautions / Restrictions  Precautions ?Precautions: None ?Precaution Comments: denies falls in past 6 months ?Restrictions ?Weight Bearing Restrictions: No  ? ?  ? ?Mobility ? Bed Mobility ?Overal bed mobility: Modified Independent ?  ?  ?  ?  ?  ?  ?  ?  ? ?Transfers ?Overall transfer level: Independent ?Equipment used: None ?  ?  ?  ?  ?  ?  ?  ?  ?  ? ?Ambulation/Gait ?Ambulation/Gait assistance: Min guard ?Gait Distance (Feet): 80 Feet ?Assistive device: None ?Gait Pattern/deviations: Decreased stride length ?Gait velocity: WFL ?  ?  ?General Gait Details: mild unsteadiness x 3 with no overt loss of balance, walked without AD,  recommended SPC for now, distance limited by fatigue, SaO2 95% on room air walking ? ?Stairs ?  ?  ?  ?  ?  ? ?Wheelchair Mobility ?  ? ?Modified Rankin (Stroke Patients Only) ?  ? ?  ? ?Balance Overall balance assessment: Needs assistance ?  ?Sitting balance-[name]: Normal ?  ?  ?  ?Standing balance-[name]: Good ?Standing balance comment: mild unsteadiness x 3 with walking, but no LOB ?  ?  ?  ?  ?  ?  ?  ?  ?  ?  ?  ?   ? ? ? ?Pertinent Vitals/Pain Pain Assessment ?Pain Assessment: No/denies pain  ? ? ?Home Living Family/patient expects to be discharged to:: Private residence ?Living Arrangements: Alone ?Available Help at Discharge: Family;Available PRN/intermittently ?Type of Home: House ?Home Access: Stairs to enter ?Entrance Stairs-Rails: Right ?Entrance Stairs-Number of Steps: 1 ?  ?Home Layout: One level ?Home Equipment: Rollator (4 wheels);Cane - single point ?Additional Comments: pt reports he has 4 sons  that help as needed  ?  ?Prior Function Prior Level of Function : Independent/Modified Independent;Driving ?  ?  ?  ?  ?  ?  ?Mobility Comments: no falls in past 6 months, walks without AD ?ADLs Comments: independent ?  ? ? ?Hand Dominance  ?   ? ?  ?Extremity/Trunk Assessment  ? Upper Extremity Assessment ?Upper Extremity Assessment: Overall WFL for tasks assessed ?  ? ?Lower Extremity  Assessment ?Lower Extremity Assessment: Overall WFL for tasks assessed (edema noted BLEs, L more than R (pt reports LLE swells since having bypass surgery)) ?  ? ?Cervical / Trunk Assessment ?Cervical / Trunk Assessment: Normal  ?Communication  ? Communication: No difficulties  ?Cognition Arousal/Alertness: Awake/alert ?Behavior During Therapy: Flat affect ?Overall Cognitive Status: Within Functional Limits for tasks assessed ?  ?  ?  ?  ?  ?  ?  ?  ?  ?  ?  ?  ?  ?  ?  ?  ?General Comments: per RN, pt's wife died in Sep 11, 2023 ?  ?  ? ?  ?General Comments   ? ?  ?Exercises    ? ?Assessment/Plan  ?  ?PT Assessment Patient needs continued PT services  ?PT Problem List Decreased activity tolerance;Decreased balance;Decreased mobility ? ?   ?  ?PT Treatment Interventions Gait training;Therapeutic exercise;Therapeutic activities   ? ?PT Goals (Current goals can be found in the Care Plan section)  ?Acute Rehab PT Goals ?Patient Stated Goal: return to independence at home ?PT Goal Formulation: With patient ?Time For Goal Achievement: 04/19/22 ?Potential to Achieve Goals: Good ? ?  ?Frequency Min 3X/week ?  ? ? ?Co-evaluation   ?  ?  ?  ?  ? ? ?  ?AM-PAC PT "6 Clicks" Mobility  ?Outcome Measure Help needed turning from your back to your side while in a flat bed without using bedrails?: None ?Help needed moving from lying on your back to sitting on the side of a flat bed without using bedrails?: None ?Help needed moving to and from a bed to a chair (including a wheelchair)?: None ?Help needed standing up from a chair using your arms (e.g., wheelchair or bedside chair)?: None ?Help needed to walk in hospital room?: A Little ?Help needed climbing 3-5 steps with a railing? : None ?6 Click Score: 23 ? ?  ?End of Session Equipment Utilized During Treatment: Gait belt ?Activity Tolerance: Patient tolerated treatment well;Patient limited by fatigue ?Patient left: in bed;with bed alarm set;with call bell/phone within reach ?Nurse  Communication: Mobility status ?PT Visit Diagnosis: Unsteadiness on feet (R26.81) ?  ? ?Time: 1348-1400 ?PT Time Calculation (min) (ACUTE ONLY): 12 min ? ? ?Charges:   PT Evaluation ?$PT Eval Low Complexity: 1 Low ?  ?  ?   ? ?Blondell Reveal Kistler PT 04/05/2022  ?Acute Rehabilitation Services ?Pager [phone] ?Office [phone] ? ? ?

## 2022-04-05 NOTE — Consult Note (Addendum)
?Cardiology Consultation:  ? ?Patient ID: [name] ?MRN: [id number]; DOB: 09-03-1934 ? ?Admit date: 04/04/2022 ?Date of Consult: 04/05/2022 ? ?PCP:  Colon Branch, MD ?  ?[facility] Providers ?Cardiologist:  Minus Breeding, MD   { ? ? ?Patient Profile:  ? ?AENEAS LONGSWORTH is a 88 y.o. 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 ? ?History of Present Illness:  ? ?Mr. Billing with above past medical history presented to the ER 04/04/2021 with chief complaints of fatigue, cough, nasal congestion, and shortness of breath. ? ?Patient was initially referred to Dr. Marlou Porch 12/28/2021 for evaluation of congestive heart failure. ?  ?He has known CAD and underwent CABG in 1997.  Last cardiac catheterization from 10/24/2008 showed severe native vessel disease with total occlusion of left main and right coronary arteries, patent SVG to marginal and posterior descending branch of RCA, patent SVG to marginal and posterior branch of circumflex artery, patent vein graft to diagonal branch of LAD, patent LIMA graft to LAD.  Normal LV function. ? ?He was noted with significant anasarca on office visit 12/28/2021 and was arranged for direct admission that day for acute diastolic heart failure.  Echocardiogram from 12/29/2021 showed EF 55%, no RWMA, mild concentric LVH, grade 2 DD, normal RV, mildly elevated PASP with RVSP 44.2 mmHg. mildly dilated LA.  Mild to moderate MR.  Moderate to severe TR.  Moderate pulmonic valve regurgitation.  Mildly dilated pulmonary artery.  He was given IV Lasix 40 mg twice daily daily transition to Lasix 40 mg daily at discharge on 01/01/2022.  GDMT was limited due to CKD stage IV.  He followed up at cardiology office was seen by Dr. Percival Spanish 01/07/22, was  educated again on salt and fluid management and appeared euvolemic at the time.  Amlodipine was uptitrated to 7.5 mg for blood pressure control.   ? ?He was last seen by Mr Eulas Post PA on 03/14/2022 was found to have continued significant lower extremity edema.  Blood pressure was elevated at 188/68.  Diuretic titration was difficult due to significant uptrending of creatinine and he was told to take Lasix 60 mg daily and follow-up with his nephrologist.  Imdur 60 mg was added for blood pressure control as well. ? ?Patient presented to the ER today complaining cough, fatigue, orthopnea going on for over 2 weeks.  He also was having more lower extremity edema and problems lying flat to sleep.  He was brought to the emergency room where he was markedly hypertensive at 205/79 mmHg.  Serum creatinine was 2.5 with baseline 2.49 in March.  Troponin was elevated at 710.  hstroponin was elevated at 52. Chest x-ray showed cardiomegaly with small pleural effusion on the left and patchy opacities in the right lung consistent with asymmetric edema or infection.  He was started on IV antibiotics and diuretics and cardiology is now asked to consult.  He has put out 800 cc since admission and is net -699 cc.  Weight is down 1 pound from admission.  Serum creatinine slightly down from 2.5-2.41 today.  He tells me that he has chronic stable angina and occasionally will have some chest discomfort when he exerts himself which he thinks is stable for him and has not worsened but Dr. Rosezella [place] last note  in January stated that he had not had any angina. ? ? ?Past Medical History:  ?Diagnosis Date  ? Anemia   ? Arthritis   ? CAD (coronary artery disease)   ? CABG 1997; grafts patent @ cath [facility] 11/09  ? DM2 (diabetes mellitus, type 2) ([place])   ? Gout   ? after tick bite  ? History of kidney stones   ? HLD (hyperlipidemia)   ? HTN (hypertension)   ? essential nos  ? Hypothyroid   ? Pancreatic lesion   ? Postsurgical aortocoronary bypass  status   ? PVD (peripheral vascular disease) ([place])   ? S/P herniorrhaphy   ? Tick fever 2012  ? [employer] [facility] [place]  ? Unspecified hyperplasia of prostate without urinary obstruction and other lower urinary tract symptoms (LUTS)   ? Urolithiasis 2015  ? hx of  ? Wears dentures   ? Wears glasses   ? ? ?Past Surgical History:  ?Procedure Laterality Date  ? BIOPSY  08/07/2020  ? Procedure: BIOPSY;  Surgeon: Rush Landmark Telford Nab., MD;  Location: [place];  Service: Gastroenterology;;  ? CATARACT EXTRACTION Bilateral   ? CORONARY ARTERY BYPASS GRAFT    ? x7 vessels 1997; cath 2002 and 2009; grafts patent   ? CYSTOSCOPY WITH RETROGRADE PYELOGRAM, URETEROSCOPY AND STENT PLACEMENT Right 02/21/2014  ? Procedure: CYSTOSCOPY WITH RIGHT  RETROGRADE PYELOGRAM,RIGHT  URETEROSCOPY WITH STONE BASKETING EXTRACTION;  Surgeon: Irine Seal, MD;  Location: [facility];  Service: Urology;  Laterality: Right;  ? ESOPHAGOGASTRODUODENOSCOPY (EGD) WITH PROPOFOL N/A 08/07/2020  ? Procedure: ESOPHAGOGASTRODUODENOSCOPY (EGD) WITH PROPOFOL;  Surgeon: Rush Landmark Telford Nab., MD;  Location: [place];  Service: Gastroenterology;  Laterality: N/A;  ? EUS N/A 08/07/2020  ? Procedure: UPPER ENDOSCOPIC ULTRASOUND (EUS) LINEAR;  Surgeon: Rush Landmark Telford Nab., MD;  Location: [place];  Service: Gastroenterology;  Laterality: N/A;  ? FINE NEEDLE ASPIRATION N/A 08/07/2020  ? Procedure: FINE NEEDLE ASPIRATION (FNA) LINEAR;  Surgeon: Irving Copas., MD;  Location: [place];  Service: Gastroenterology;  Laterality: N/A;  ? HERNIA REPAIR    ? INGUINAL HERNIA REPAIR    ? MULTIPLE TOOTH EXTRACTIONS    ? SHOULDER SURGERY    ? SPLENECTOMY, TOTAL N/A 10/26/2020  ? Procedure: SPLENECTOMY;  Surgeon: Stark Klein, MD;  Location: [place];  Service: General;  Laterality: N/A;  ?  ? ?Home Medications:  ?Prior to Admission medications   ?Medication Sig Start Date End Date Taking? Authorizing Provider  ?acetaminophen (TYLENOL) 500 MG  tablet Take 500 mg by mouth every 4 (four) hours as needed for moderate pain.   Yes [provider]  ?amLODipine (NORVASC) 5 MG tablet TAKE 1 AND 1/2 TABLETS DAILY ?Patient taking differently: Take 7 mg by mouth daily. 01/07/22  Yes Minus Breeding, MD  ?aspirin EC 81 MG tablet Take 81 mg by mouth at bedtime.   Yes [provider]  ?Cholecalciferol (VITAMIN D3) 50 MCG (2000 UT) capsule Take 2,000 Units by mouth daily.   Yes [provider]  ?Cyanocobalamin (B-12) 1000 MCG TABS Take 1,000 mcg by mouth daily.   Yes [provider]  ?furosemide (LASIX) 40 MG tablet Take 1.5 tablets (60 mg total) by mouth daily. 03/25/22  Yes Paz, Alda Berthold, MD  ?hydrALAZINE (APRESOLINE) 100 MG tablet Take 100 mg by mouth 3 (three) times daily. 07/30/21  Yes [provider]  ?insulin aspart (NOVOLOG FLEXPEN) 100 UNIT/ML FlexPen 3 times a day (just before each meal), 5-6-6 units. ?Patient taking differently: 6  Units 3 (three) times daily with meals. 12/25/21  Yes Renato Shin, MD  ?Insulin Glargine [facility]) 100 UNIT/ML Inject 6 Units into the skin daily. ?Patient taking differently: Inject 7 Units into the skin daily. 12/06/21  Yes Renato Shin, MD  ?isosorbide mononitrate (IMDUR) 60 MG 24 hr tablet Take 1 tablet (60 mg total) by mouth daily. 03/14/22 06/12/22 Yes Almyra Deforest, PA  ?levothyroxine (SYNTHROID) 150 MCG tablet TAKE 1 TABLET BY MOUTH DAILY BEFORE BREAKFAST. ?Patient taking differently: Take 150 mcg by mouth daily before breakfast. 02/25/22  Yes Paz, Alda Berthold, MD  ?metoprolol succinate (TOPROL-XL) 50 MG 24 hr tablet TAKE 1 TABLET BY MOUTH DAILY. TAKE WITH OR IMMEDIATELY FOLLOWING A MEAL. ?Patient taking differently: Take 50 mg by mouth daily. 04/02/22  Yes Paz, Alda Berthold, MD  ?nitroGLYCERIN (NITROSTAT) 0.4 MG SL tablet Place 1 tablet (0.4 mg total) under the tongue every 5 (five) minutes x 3 doses as needed for chest pain. 01/01/22  Yes Margie Billet, PA-C  ?omeprazole (PRILOSEC) 40  MG capsule Take 1 capsule (40 mg total) by mouth daily. 04/20/21  Yes Shelda Pal, DO  ?Pancrelipase, Lip-Prot-Amyl, (ZENPEP) 25000-79000 units CPEP Take 2 capsules by mouth with every meal and snacks ?Patien

## 2022-04-06 DIAGNOSIS — N179 Acute kidney failure, unspecified: Secondary | ICD-10-CM

## 2022-04-06 DIAGNOSIS — J189 Pneumonia, unspecified organism: Secondary | ICD-10-CM | POA: Diagnosis not present

## 2022-04-06 LAB — BASIC METABOLIC PANEL
Anion gap: 11 (ref 5–15)
BUN: 51 mg/dL — ABNORMAL HIGH (ref 8–23)
CO2: 24 mmol/L (ref 22–32)
Calcium: 8.4 mg/dL — ABNORMAL LOW (ref 8.9–10.3)
Chloride: 100 mmol/L (ref 98–111)
Creatinine, Ser: 3.02 mg/dL — ABNORMAL HIGH (ref 0.61–1.24)
GFR, Estimated: 20 mL/min — ABNORMAL LOW (ref >60)
Glucose, Bld: 144 mg/dL — ABNORMAL HIGH (ref 70–99)
Potassium: 3.6 mmol/L (ref 3.5–5.1)
Sodium: 135 mmol/L (ref 135–145)

## 2022-04-06 LAB — GLUCOSE, CAPILLARY
Glucose-Capillary: 287 mg/dL — ABNORMAL HIGH (ref 70–99)
Glucose-Capillary: 223 mg/dL — ABNORMAL HIGH (ref 70–99)
Glucose-Capillary: 214 mg/dL — ABNORMAL HIGH (ref 70–99)
Glucose-Capillary: 149 mg/dL — ABNORMAL HIGH (ref 70–99)

## 2022-04-06 MED ORDER — FUROSEMIDE 10 MG/ML IJ SOLN
80.0000 mg | Freq: Every day | INTRAMUSCULAR | Status: DC
  Administered 2022-04-07: 80 mg via INTRAVENOUS
  Filled 2022-04-06: qty 8

## 2022-04-06 MED ORDER — ALBUTEROL SULFATE (2.5 MG/3ML) 0.083% IN NEBU: 2.5000 mg | INHALATION_SOLUTION | RESPIRATORY_TRACT | Status: DC | PRN

## 2022-04-06 NOTE — Evaluation (Signed)
Occupational Therapy Evaluation ?Patient Details ?Name: Ethan Mcneil ?MRN: [id number] ?DOB: May 18, 1934 ?Today's Date: 04/06/2022 ? ? ?History of Present Illness 88 y.o. 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 Dx of acute on chronic CHF.  ? ?Clinical Impression ?  ?[name] is an 88 year old man who presents with above medical history. On evaluation he demonstrates independence with ambulation and Adls. He had one loss of balance in the hallway that he was able to correct. He reports feeling weak but overall he is functional. He has no current Ot needs. Recommend ambulate with nursing staff frequently.   ?   ? ?Recommendations for follow up therapy are one component of a multi-disciplinary discharge planning process, led by the attending physician.  Recommendations may be updated based on patient status, additional functional criteria and insurance authorization.  ? ?Follow Up Recommendations ? No OT follow up  ?  ?Assistance Recommended at Discharge None  ?Patient can return home with the following   ? ?  ?Functional Status Assessment ? Patient has not had a recent decline in their functional status  ?Equipment Recommendations ? None recommended by OT  ?  ?Recommendations for Other Services   ? ? ?  ?Precautions / Restrictions Precautions ?Precautions: None ?Precaution Comments: denies falls in past 6 months ?Restrictions ?Weight Bearing Restrictions: No  ? ?  ? ?Mobility Bed Mobility ?Overal bed mobility: Independent ?  ?  ?  ?  ?  ?  ?  ?  ? ?Transfers ?Overall transfer level: Independent ?  ?   ?  ?  ?  ?  ?  ?  ?  ?  ? ?  ?Balance Overall balance assessment: Mild deficits observed, not formally tested ?  ?  ?  ?  ?  ?  ?  ?  ?  ?  ?  ?  ?  ?  ?  ?  ?  ?  ?   ? ?ADL either performed or assessed with clinical judgement  ? ?ADL Overall ADL's : Independent ?  ?  ?  ?  ?  ?  ?  ?  ?  ?  ?  ?  ?  ?  ?  ?  ?  ?  ?  ?   ? ? ? ?Vision Patient Visual Report: No change from baseline ?   ?   ?Perception   ?  ?Praxis   ?  ? ?Pertinent Vitals/Pain Pain Assessment ?Pain Assessment: No/denies pain  ? ? ? ?Hand Dominance Right ?  ?Extremity/Trunk Assessment Upper Extremity Assessment ?Upper Extremity Assessment: Overall WFL for tasks assessed ?  ?Lower Extremity Assessment ?Lower Extremity Assessment: Defer to PT evaluation ?  ?Cervical / Trunk Assessment ?Cervical / Trunk Assessment: Normal ?  ?Communication Communication ?Communication: No difficulties ?  ?Cognition Arousal/Alertness: Awake/alert ?Behavior During Therapy: [facility] for tasks assessed/performed ?Overall Cognitive Status: Within Functional Limits for tasks assessed ?  ?  ?  ?  ?  ?  ?  ?  ?  ?  ?  ?  ?  ?  ?  ?  ?  ?  ?  ?General Comments    ? ?  ?  Exercises   ?  ?Shoulder Instructions    ? ? ?Home Living Family/patient expects to be discharged to:: Private residence ?Living Arrangements: Alone ?Available Help at Discharge: Family;Available PRN/intermittently ?Type of Home: House ?Home Access: Stairs to enter ?Entrance Stairs-Number of Steps: 1 ?Entrance Stairs-Rails: Right ?Home Layout: One level ?  ?  ?Bathroom Shower/Tub: Walk-in shower ?  ?Bathroom Toilet: Handicapped height ?  ?  ?Home Equipment: Rollator (4 wheels);Cane - single point;Shower seat ?  ?Additional Comments: pt reports he has 4 sons that help as needed ?  ? ?  ?Prior Functioning/Environment Prior Level of Function : Independent/Modified Independent;Driving ?  ?  ?  ?  ?  ?  ?Mobility Comments: no falls in past 6 months, walks without AD ?ADLs Comments: independent ?  ? ?  ?  ?OT Problem List:    ?  ?   ?OT Treatment/Interventions:    ?  ?OT Goals(Current goals can be found in the care plan section) Acute Rehab OT Goals ?OT Goal Formulation: All assessment and education complete, DC therapy  ?OT Frequency:   ?  ? ?Co-evaluation   ?  ?  ?  ?  ? ?  ?AM-PAC OT "6 Clicks" Daily Activity     ?Outcome Measure Help from another person eating meals?: None ?Help from another person taking care of personal grooming?: None ?Help from another person toileting, which includes using toliet, bedpan, or urinal?: None ?Help from another person bathing (including washing, rinsing, drying)?: None ?Help from another person to put on and taking off regular upper body clothing?: None ?Help from another person to put on and taking off regular lower body clothing?: None ?6 Click Score: 24 ?  ?End of Session   ? ?Activity Tolerance: Patient tolerated treatment well ?Patient left: in bed;with call bell/phone within reach ? ?OT Visit Diagnosis: Muscle weakness (generalized) (M62.81)  ?              ?Time: 9357-0177 ?OT Time Calculation (min): 9 min ?Charges:  OT General Charges ?$OT Visit: 1 Visit ?OT Evaluation ?$OT Eval Low Complexity: 1 Low ? ?Zoriana Oats, OTR/L ?Acute [facility] Rehab Services  ?Office [phone] ?Pager: [phone]  ? ?Lisia Westbay L Lyla Jasek ?04/06/2022, 12:02 PM ?

## 2022-04-06 NOTE — Progress Notes (Signed)
? ?DAILY PROGRESS NOTE  ? ?Patient Name: Ethan Mcneil ?Date of Encounter: 04/06/2022 ?Cardiologist: Minus Breeding, MD ? ?Chief Complaint  ? ?Breathing better ? ?Patient Profile  ? ?Ethan Mcneil is a 88 y.o. 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 ? ?Subjective  ? ?Net negative 871 cc overnight, now 1.3L negative. Creatinine increased with diuresis - now 3.02 (up from 2.41). ? ?Objective  ? ?Vitals:  ? 04/05/22 2021 04/06/22 0500 04/06/22 0530 04/06/22 0531  ?BP: (!) 152/61  (!) 161/71   ?Pulse: (!) 59  65   ?Resp: 16  18   ?Temp: 98.4 ?F (36.9 ?C)  98.2 ?F (36.8 ?C)   ?TempSrc: Oral  Oral   ?SpO2: 95%  93%   ?Weight:  69.2 kg  69.2 kg  ?Height:      ? ? ?Intake/Output Summary (Last 24 hours) at 04/06/2022 0923 ?Last data filed at 04/06/2022 7654 ?Gross per 24 hour  ?Intake 960 ml  ?Output 2525 ml  ?Net -1565 ml  ? ?Filed Weights  ? 04/05/22 0659 04/06/22 0500 04/06/22 0531  ?Weight: 71.1 kg 69.2 kg 69.2 kg  ? ? ?Physical Exam  ? ?General appearance: alert and no distress ?Neck: no carotid bruit, no JVD, and thyroid not enlarged, symmetric, no tenderness/mass/nodules ?Lungs: rales RLL ?Heart: regular rate and rhythm ?Abdomen: soft, non-tender; bowel sounds normal; no masses,  no organomegaly ?Extremities: edema 2+ RLE and 1+ LLE ?Pulses: 2+ and symmetric ?Skin: Skin color, texture, turgor normal. No rashes or lesions ?Neurologic: Grossly normal ?Psych: Pleasant ? ?Inpatient Medications  ?  ?Scheduled Meds: ? amLODipine  10 mg Oral Daily  ? aspirin EC  81 mg Oral QHS  ? azithromycin  500 mg Oral Daily  ? cholecalciferol  2,000 Units Oral Daily  ? furosemide  80 mg Intravenous BID  ? heparin  5,000 Units Subcutaneous Q8H  ? hydrALAZINE  100 mg Oral TID  ? insulin  aspart  0-6 Units Subcutaneous TID WC  ? insulin glargine-yfgn  7 Units Subcutaneous Daily  ? isosorbide mononitrate  60 mg Oral Daily  ? levothyroxine  150 mcg Oral QAC breakfast  ? lipase/protease/amylase  24,000 Units Oral Daily  ? metoprolol succinate  50 mg Oral Daily  ? pantoprazole  40 mg Oral Daily  ? simvastatin  20 mg Oral QHS  ? sodium bicarbonate  650 mg Oral BID  ? vitamin B-12  1,000 mcg Oral Daily  ? ? ?Continuous Infusions: ? cefTRIAXone (ROCEPHIN)  IV 2 g (04/05/22 1018)  ? ? ?PRN Meds: ?acetaminophen **OR** acetaminophen, albuterol, guaiFENesin-dextromethorphan, hydrALAZINE, labetalol, ondansetron **OR** ondansetron (ZOFRAN) IV  ? ?Labs  ? ?Results for orders placed or performed during the hospital encounter of 04/04/22 (from the past 48 hour(s))  ?CBC with Differential     Status: Abnormal  ? Collection Time: 04/04/22 10:36 AM  ?Result Value Ref Range  ? WBC 11.3 (H) 4.0 - 10.5 K/uL  ?  Comment: REPEATED TO VERIFY ?WHITE COUNT CONFIRMED ON SMEAR ?  ? RBC 4.02 (L) 4.22 - 5.81 MIL/uL  ? Hemoglobin 12.2 (L) 13.0 - 17.0 g/dL  ? HCT 36.4 (L) 39.0 - 52.0 %  ? MCV 90.5 80.0 - 100.0 fL  ? MCH 30.3 26.0 - 34.0 pg  ?  MCHC 33.5 30.0 - 36.0 g/dL  ? RDW 16.2 (H) 11.5 - 15.5 %  ? Platelets 332 150 - 400 K/uL  ? nRBC 0.0 0.0 - 0.2 %  ? Neutrophils Relative % 56 %  ? Neutro Abs 6.3 1.7 - 7.7 K/uL  ? Lymphocytes Relative 14 %  ? Lymphs Abs 1.6 0.7 - 4.0 K/uL  ? Monocytes Relative 14 %  ? Monocytes Absolute 1.6 (H) 0.1 - 1.0 K/uL  ? Eosinophils Relative 16 %  ? Eosinophils Absolute 1.8 (H) 0.0 - 0.5 K/uL  ? Basophils Relative 0 %  ? Basophils Absolute 0.0 0.0 - 0.1 K/uL  ? WBC Morphology MORPHOLOGY UNREMARKABLE   ? RBC Morphology See Note   ? Smear Review MORPHOLOGY UNREMARKABLE   ? Abs Immature Granulocytes 0.00 0.00 - 0.07 K/uL  ? Acanthocytes PRESENT   ? Schistocytes PRESENT   ? Target Cells PRESENT   ?  Comment: Performed at [facility], [address] [place], [place] [address]  ?Basic metabolic  panel     Status: Abnormal  ? Collection Time: 04/04/22 10:36 AM  ?Result Value Ref Range  ? Sodium 135 135 - 145 mmol/L  ? Potassium 3.5 3.5 - 5.1 mmol/L  ? Chloride 100 98 - 111 mmol/L  ? CO2 23 22 - 32 mmol/L  ? Glucose, Bld 154 (H) 70 - 99 mg/dL  ?  Comment: Glucose reference range applies only to samples taken after fasting for at least 8 hours.  ? BUN 41 (H) 8 - 23 mg/dL  ? Creatinine, Ser 2.56 (H) 0.61 - 1.24 mg/dL  ? Calcium 8.8 (L) 8.9 - 10.3 mg/dL  ? GFR, Estimated 24 (L) >60 mL/min  ?  Comment: (NOTE) ?Calculated using the CKD-EPI Creatinine Equation (2021) ?  ? Anion gap 12 5 - 15  ?  Comment: Performed at [facility], [place] [address], [place], [place] [address]  ?Brain natriuretic peptide     Status: Abnormal  ? Collection Time: 04/04/22 10:36 AM  ?Result Value Ref Range  ? B Natriuretic Peptide 710.8 (H) 0.0 - 100.0 pg/mL  ?  Comment: Performed at [facility], [address], [place], [place] [address]  ?Troponin I (High Sensitivity)     Status: Abnormal  ? Collection Time: 04/04/22 10:36 AM  ?Result Value Ref Range  ? Troponin I (High Sensitivity) 52 (H) <18 ng/L  ?  Comment: (NOTE) ?Elevated high sensitivity troponin I (hsTnI) values and significant  ?changes across serial measurements may suggest ACS but many other  ?chronic and acute conditions are known to elevate hsTnI results.  ?Refer to the "Links" section for chest pain algorithms and additional  ?guidance. ?Performed at [facility], [address], [place] [address] ?  ?Resp Panel by RT-PCR (Flu A&B, Covid) Nasopharyngeal Swab     Status: None  ? Collection Time: 04/04/22 10:36 AM  ? Specimen: Nasopharyngeal Swab; Nasopharyngeal(NP) swabs in vial transport medium  ?Result Value Ref Range  ? SARS Coronavirus 2 by RT PCR NEGATIVE NEGATIVE  ?  Comment: (NOTE) ?SARS-CoV-2 target nucleic acids are NOT DETECTED. ? ?The SARS-CoV-2 RNA is generally detectable in upper respiratory ?specimens  during the acute phase of infection. The lowest ?concentration of SARS-CoV-2 viral copies this assay can detect is ?138 copies/mL. A negative result does not preclude SARS-Cov-2 ?infection and should not be used as the sole basis for treatment or ?other patient management decisions. A  negative result may occur with  ?improper specimen collection/handling, submission of specimen other ?than nasopharyngeal swab, presence of viral mutation(s) within the ?areas targeted by this assay, and inadequate number of viral ?copies(<138 copies/mL). A negative result must be combined with ?clinical observations, patient history, and epidemiological ?information. The expected result is Negative. ? ?Fact Sheet for Patients:  ?EntrepreneurPulse.com.au ? ?Fact Sheet for Healthcare Providers:  ?IncredibleEmployment.be ? ?This test is no t yet approved or cleared by the [place] FDA and  ?has been authorized for detection and/or diagnosis of SARS-CoV-2 by ?FDA under an Emergency Use Authorization (EUA). This EUA will remain  ?in effect (meaning this test can be used) for the duration of the ?COVID-19 declaration under Section 564(b)(1) of the Act, 21 ?U.S.C.section 360bbb-3(b)(1), unless the authorization is terminated  ?or revoked sooner.  ? ? ?  ? Influenza A by PCR NEGATIVE NEGATIVE  ? Influenza B by PCR NEGATIVE NEGATIVE  ?  Comment: (NOTE) ?The Xpert Xpress SARS-CoV-2/FLU/RSV plus assay is intended as an aid ?in the diagnosis of influenza from Nasopharyngeal swab specimens and ?should not be used as a sole basis for treatment. Nasal washings and ?aspirates are unacceptable for Xpert Xpress SARS-CoV-2/FLU/RSV ?testing. ? ?Fact Sheet for Patients: ?EntrepreneurPulse.com.au ? ?Fact Sheet for Healthcare Providers: ?IncredibleEmployment.be ? ?This test is not yet approved or cleared by the [place] FDA and ?has been authorized for detection and/or diagnosis  of SARS-CoV-2 by ?FDA under an Emergency Use Authorization (EUA). This EUA will remain ?in effect (meaning this test can be used) for the duration of the ?COVID-19 declaration under Section 564(b)(1) of the

## 2022-04-06 NOTE — Progress Notes (Signed)
?PROGRESS NOTE ?Ethan Mcneil  WIO:[id number] DOB: May 24, 1934 DOA: 04/04/2022 ?PCP: Colon Branch, MD  ? ?Brief Narrative/Hospital Course: ? 88 y.o. 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  No vomiting diarrhea no sore throat or fever.  ?  ?ED Course: Blood pressure on higher side in 205/79 on presentation, no tachypnea or tachycardia or hypoxia.  Labs showed creatinine 2.5 from 2.49 on 3/30, BNP 710, troponin 52, lactic acid 0.7, WBC 11.3, COVID screen negative, blood cultures sent. ?CXR: Cardiomegaly with a small left pleural effusion and patchy opacities in the right lung may reflect asymmetric edema or infection.  Patient was given IV antibiotics ceftriaxone/azithromycin, oral diuretics, bronchodilators and admission requested for further management  ?  ?Subjective: ?Seen and examined this morning. ?Reports he feels much improved after diuresis. ?Overnight afebrile BP 150s to 160s ?Creatinine uptrending 3.0 this morning-urine output 2300. ? ?Assessment and Plan: ?Principal Problem: ?  CAP (community acquired pneumonia) ?Active Problems: ?  HYPERLIPIDEMIA ?  HTN (hypertension) ?  CAD (coronary artery disease) ?  PVD ?  Hypothyroidism ?  Anemia ?  Pancreatic insufficiency ?  Asplenia after surgical procedure ?  Chronic kidney disease, stage IV (severe) ([place]) ?  Acute on chronic diastolic CHF (congestive heart failure) ([place]) ?  Hypertensive urgency ?  Metabolic acidosis ?  Hypokalemia ?  Demand ischemia ([place]) ?  Tricuspid  regurgitation ?  Physical deconditioning ?  CHF (congestive heart failure) ([place]) ?  ?Community-acquired pneumonia ?Acute on chronic diastolic CHF: ?Continue with current IV antibiotics leukocytosis has resolved.  Afebrile.  Continue to address CHF as below.  PT OT.  Patient is immunocompromised status with asplenia.  ? ?Acute on chronic  diastolic CHF ?TR moderate to severe ?MR mild to moderate ?Pulmonary regurgitation moderate: ?Patient with chronic leg swelling but recently endorsing orthopnea symptoms, chest x-ray reviewed -s/p  iv lasix 40 mg x1 and was switched to iV Lasix -at 80 mg iv bid per cardiology given worsening creatinine hold Lasix this morning and await for further cardiology recommendation-seen by cardiology this morning Lasix decreased to 80 mg daily from twice daily.Echo from January this year G2 DD with EF 55%.  Continue to monitor intake output Daily weight and continue plan of care as per cardiology. ?Net IO Since Admission: -1,555.39 mL [04/06/22 1142]  ?Filed Weights  ? 04/05/22 0659 04/06/22 0500 04/06/22 0531  ?Weight: 71.1 kg 69.2 kg 69.2 kg  ?  ?Chronic kidney disease, stage IV with metabolic acidosis: Creatinine holding stable continue to monitor.  Continue oral bicarbonate.   ?Recent Labs  ?Lab 04/04/22 ?1036 04/05/22 ?0457 04/06/22 ?0533  ?BUN 41* 45* 51*  ?CREATININE 2.56* 2.41* 3.02*  ?  ?HLD ?CAD ?PVD: ?Demand ischemia with elevated troponin: ?slightly positive troponin downtrending, no chest pain.  On aspirin Imdur simvastatin.   ?  ?Hypothyroidism:  on home synthroid. ?  ?Hypertension urgency: BP poorly controlled in 200 on admission likely contributing to his symptoms-BP improving now with multiple home regimen, amlodipine increased to 10 mg, continue hydralazine 100 3 times daily, Imdur 60 daily, metoprolol XL 50 daily.  Monitor and adjust.  ?  ?Type 2 diabetes mellitus: Well-controlled on home regimen, SSI.Last A1c stable 6.51/6/23 ?Recent Labs  ?Lab 04/05/22 ?0738  04/05/22 ?1133 04/05/22 ?1654 04/05/22 ?2200 04/06/22 ?4536  ?GLUCAP 159* 226* 181* 157* 287*  ?  ?History of bradycardia:monitor in tele ?  ?Chronic anemia:monitor hh-stable. ?  ?Pancreatic insufficiency-continue pancreatic enzymes.  ?  ?Asplenia hx Continue antibiotics as #1 monitor.  Follow-up blood culture ? ?Hypokalemia being repleted orally. ? ?Deconditioning debility lives alone wife passed away, two-person assist, PT OT following closely.  Activity level 5. ? ?DVT prophylaxis: heparin injection 5,000 Units Start: 04/04/22 2200 ?Place TED hose Start: 04/04/22 1745 ?Code Status:   Code Status: DNR ?Family Communication: plan of care discussed with patient at bedside. ?Patient status is: Inpatient level of care: Telemetry  ?Remains inpatient because: For ongoing management of pneumonia and congestive heart failure exacerbation ?Patient currently not stable ? ?Dispo: The patient is from: Home ?           Anticipated disposition: Home ? ?Mobility Assessment (last 72 hours)   ? ? Mobility Assessment   ? ? [place] Name 04/05/22 1411 04/05/22 0900 04/04/22 1920 04/04/22 17:14:40  ?  ? Does patient have an order for bedrest or is patient medically unstable -- No - Continue assessment No - Continue assessment No - Continue assessment   ? What is the highest level of mobility based on the progressive mobility assessment? Level 5 (Walks with assist in room/hall) - Balance while stepping forward/back and can walk in room with assist - Complete Level 5 (Walks with assist in room/hall) - Balance while stepping forward/back and can walk in room with assist - Complete Level 5 (Walks with assist in room/hall) - Balance while stepping forward/back and can walk in room with assist - Complete Level 5 (Walks with assist in room/hall) - Balance while stepping forward/back and can walk in room with assist - Complete   ? ?  ?  ? ?  ?  ? ?Objective: ?Vitals last 24 hrs: ?Vitals:  ? 04/06/22 0530 04/06/22 0531 04/06/22 1022 04/06/22 1133   ?BP: (!) 161/71  (!) 165/63   ?Pulse: 65  (!) 56 61  ?Resp: 18  16   ?Temp: 98.2 ?F (36.8 ?C)  98 ?F (36.7 ?C)   ?TempSrc: Oral  Oral   ?SpO2: 93%  97%   ?Weight:  69.2 kg    ?Height:      ? ?Weight change: -2.1 kg ? ?Physical Examination: ?General exam: AA, older than stated age, weak appearing. ?HEENT:Oral mucosa moist, Ear/Nose WNL grossly, dentition normal. ?Respiratory system: bilaterally diminished, no use of accessory muscle ?Cardiovascular system: S1 & S2 +, No JVD,. ?Gastrointestinal system: Abdomen soft,NT,ND,BS+ ?Nervous System:Alert, awake, moving extremities and grossly nonfocal ?Extremities: LE ankle edema [resent but improving, distal peripheral pulses palpable.  ?Skin: No rashes,no icterus. ?MSK: Normal muscle bulk,tone, power  ? ?Medications reviewed: Scheduled Meds: ? amLODipine  10 mg Oral Daily  ? aspirin EC  81 mg Oral QHS  ? azithromycin  500 mg Oral Daily  ? cholecalciferol  2,000 Units Oral Daily  ? [START ON 04/07/2022] furosemide  80 mg Intravenous Daily  ? heparin  5,000 Units Subcutaneous Q8H  ? hydrALAZINE  100 mg Oral TID  ? insulin aspart  0-6 Units Subcutaneous TID WC  ? insulin glargine-yfgn  7 Units Subcutaneous Daily  ? isosorbide mononitrate  60 mg Oral Daily  ? levothyroxine  150 mcg Oral QAC breakfast  ? lipase/protease/amylase  24,000 Units Oral Daily  ? metoprolol succinate  50 mg Oral Daily  ? pantoprazole  40 mg Oral Daily  ? simvastatin  20 mg Oral QHS  ? sodium bicarbonate  650 mg Oral BID  ? vitamin B-12  1,000 mcg Oral Daily  ? ?Continuous Infusions: ? cefTRIAXone (ROCEPHIN)  IV 2 g (04/06/22 1137)  ? ? ?  ?Diet Order   ? ?       ?  Diet heart healthy/carb modified Room service appropriate? Yes; Fluid consistency: Thin  Diet effective now       ?  ? ?  ?  ? ?  ?  ? ?  ?  ?  ? ? ?Intake/Output Summary (Last 24 hours) at 04/06/2022 1142 ?Last data filed at 04/06/2022 2094 ?Gross per 24 hour  ?Intake 960 ml  ?Output 2325 ml  ?Net -1365 ml  ? ?Net IO Since Admission: -1,555.39  mL [04/06/22 1142]  ?Wt Readings from Last 3 Encounters:  ?04/06/22 69.2 kg  ?04/04/22 71.4 kg  ?03/14/22 78 kg  ?  ? ?Unresulted Labs (From admission, onward)  ? ?  Start     Ordered  ? 04/05/22 0500  Basic metab

## 2022-04-07 DIAGNOSIS — J189 Pneumonia, unspecified organism: Secondary | ICD-10-CM | POA: Diagnosis not present

## 2022-04-07 LAB — BASIC METABOLIC PANEL
Anion gap: 12 (ref 5–15)
BUN: 51 mg/dL — ABNORMAL HIGH (ref 8–23)
CO2: 22 mmol/L (ref 22–32)
Calcium: 8.4 mg/dL — ABNORMAL LOW (ref 8.9–10.3)
Chloride: 101 mmol/L (ref 98–111)
Creatinine, Ser: 2.89 mg/dL — ABNORMAL HIGH (ref 0.61–1.24)
GFR, Estimated: 21 mL/min — ABNORMAL LOW (ref >60)
Glucose, Bld: 136 mg/dL — ABNORMAL HIGH (ref 70–99)
Potassium: 3.8 mmol/L (ref 3.5–5.1)
Sodium: 135 mmol/L (ref 135–145)

## 2022-04-07 LAB — CULTURE, RESPIRATORY W GRAM STAIN
Culture: NORMAL
Gram Stain: NONE SEEN

## 2022-04-07 LAB — GLUCOSE, CAPILLARY
Glucose-Capillary: 125 mg/dL — ABNORMAL HIGH (ref 70–99)
Glucose-Capillary: 297 mg/dL — ABNORMAL HIGH (ref 70–99)
Glucose-Capillary: 240 mg/dL — ABNORMAL HIGH (ref 70–99)
Glucose-Capillary: 156 mg/dL — ABNORMAL HIGH (ref 70–99)

## 2022-04-07 MED ORDER — TORSEMIDE 20 MG PO TABS
40.0000 mg | ORAL_TABLET | Freq: Every day | ORAL | Status: DC
  Administered 2022-04-07 – 2022-04-08 (×2): 40 mg via ORAL
  Filled 2022-04-07 (×2): qty 2

## 2022-04-07 MED ORDER — ALBUTEROL SULFATE (2.5 MG/3ML) 0.083% IN NEBU
2.5000 mg | INHALATION_SOLUTION | Freq: Four times a day (QID) | RESPIRATORY_TRACT | Status: DC | PRN
  Administered 2022-04-07: 2.5 mg via RESPIRATORY_TRACT
  Filled 2022-04-07: qty 3

## 2022-04-07 NOTE — Progress Notes (Addendum)
? ?DAILY PROGRESS NOTE  ? ?Patient Name: Ethan Mcneil ?Date of Encounter: 04/07/2022 ?Cardiologist: Minus Breeding, MD ? ?Chief Complaint  ? ?Breathing better ? ?Patient Profile  ? ?Ethan Mcneil is a 88 y.o. 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 ? ?Subjective  ? ?Net negative 530 cc overnight, now 1.8L negative. Creatinine has improved today to 2.89 (from 3.02). ? ?Objective  ? ?Vitals:  ? 04/06/22 1133 04/06/22 2033 04/07/22 0500 04/07/22 0526  ?BP:  (!) 149/58  (!) 154/64  ?Pulse: 61 (!) 55  (!) 59  ?Resp:  18  16  ?Temp:  98.3 ?F (36.8 ?C)  98.2 ?F (36.8 ?C)  ?TempSrc:  Oral  Oral  ?SpO2:  94%  95%  ?Weight:   69.8 kg   ?Height:      ? ? ?Intake/Output Summary (Last 24 hours) at 04/07/2022 1013 ?Last data filed at 04/07/2022 0800 ?Gross per 24 hour  ?Intake 1278 ml  ?Output 1100 ml  ?Net 178 ml  ? ?Filed Weights  ? 04/06/22 0500 04/06/22 0531 04/07/22 0500  ?Weight: 69.2 kg 69.2 kg 69.8 kg  ? ? ?Physical Exam  ? ?General appearance: alert and no distress ?Neck: no carotid bruit, no JVD, and thyroid not enlarged, symmetric, no tenderness/mass/nodules ?Lungs: rales RLL ?Heart: regular rate and rhythm ?Abdomen: soft, non-tender; bowel sounds normal; no masses,  no organomegaly ?Extremities: edema 1+ RLE and 1+ LLE ?Pulses: 2+ and symmetric ?Skin: Skin color, texture, turgor normal. No rashes or lesions ?Neurologic: Grossly normal ?Psych: Pleasant ? ?Inpatient Medications  ?  ?Scheduled Meds: ? amLODipine  10 mg Oral Daily  ? aspirin EC  81 mg Oral QHS  ? azithromycin  500 mg Oral Daily  ? cholecalciferol  2,000 Units Oral Daily  ? furosemide  80 mg Intravenous Daily  ? heparin  5,000 Units Subcutaneous Q8H  ? hydrALAZINE  100 mg Oral TID  ? insulin aspart  0-6  Units Subcutaneous TID WC  ? insulin glargine-yfgn  7 Units Subcutaneous Daily  ? isosorbide mononitrate  60 mg Oral Daily  ? levothyroxine  150 mcg Oral QAC breakfast  ? lipase/protease/amylase  24,000 Units Oral Daily  ? metoprolol succinate  50 mg Oral Daily  ? pantoprazole  40 mg Oral Daily  ? simvastatin  20 mg Oral QHS  ? sodium bicarbonate  650 mg Oral BID  ? vitamin B-12  1,000 mcg Oral Daily  ? ? ?Continuous Infusions: ? cefTRIAXone (ROCEPHIN)  IV 2 g (04/07/22 9371)  ? ? ?PRN Meds: ?acetaminophen **OR** acetaminophen, albuterol, guaiFENesin-dextromethorphan, hydrALAZINE, ondansetron **OR** ondansetron (ZOFRAN) IV  ? ?Labs  ? ?Results for orders placed or performed during the hospital encounter of 04/04/22 (from the past 48 hour(s))  ?Glucose, capillary     Status: Abnormal  ? Collection Time: 04/05/22 11:33 AM  ?Result Value Ref Range  ? Glucose-Capillary 226 (H) 70 - 99 mg/dL  ?  Comment: Glucose reference range applies only to samples taken after fasting for at least 8 hours.  ?Glucose, capillary     Status: Abnormal  ? Collection Time: 04/05/22  4:54 PM  ?Result Value Ref Range  ? Glucose-Capillary 181 (H) 70 - 99 mg/dL  ?  Comment: Glucose reference range applies only to samples taken  after fasting for at least 8 hours.  ?Glucose, capillary     Status: Abnormal  ? Collection Time: 04/05/22 10:00 PM  ?Result Value Ref Range  ? Glucose-Capillary 157 (H) 70 - 99 mg/dL  ?  Comment: Glucose reference range applies only to samples taken after fasting for at least 8 hours.  ?Basic metabolic panel     Status: Abnormal  ? Collection Time: 04/06/22  5:33 AM  ?Result Value Ref Range  ? Sodium 135 135 - 145 mmol/L  ? Potassium 3.6 3.5 - 5.1 mmol/L  ? Chloride 100 98 - 111 mmol/L  ? CO2 24 22 - 32 mmol/L  ? Glucose, Bld 144 (H) 70 - 99 mg/dL  ?  Comment: Glucose reference range applies only to samples taken after fasting for at least 8 hours.  ? BUN 51 (H) 8 - 23 mg/dL  ? Creatinine, Ser 3.02 (H) 0.61 - 1.24 mg/dL   ? Calcium 8.4 (L) 8.9 - 10.3 mg/dL  ? GFR, Estimated 20 (L) >60 mL/min  ?  Comment: (NOTE) ?Calculated using the CKD-EPI Creatinine Equation (2021) ?  ? Anion gap 11 5 - 15  ?  Comment: Performed at [facility], [place] [address], [place], [place] [address]  ?Glucose, capillary     Status: Abnormal  ? Collection Time: 04/06/22  8:28 AM  ?Result Value Ref Range  ? Glucose-Capillary 287 (H) 70 - 99 mg/dL  ?  Comment: Glucose reference range applies only to samples taken after fasting for at least 8 hours.  ?Glucose, capillary     Status: Abnormal  ? Collection Time: 04/06/22 11:45 AM  ?Result Value Ref Range  ? Glucose-Capillary 223 (H) 70 - 99 mg/dL  ?  Comment: Glucose reference range applies only to samples taken after fasting for at least 8 hours.  ?Glucose, capillary     Status: Abnormal  ? Collection Time: 04/06/22  4:34 PM  ?Result Value Ref Range  ? Glucose-Capillary 214 (H) 70 - 99 mg/dL  ?  Comment: Glucose reference range applies only to samples taken after fasting for at least 8 hours.  ?Glucose, capillary     Status: Abnormal  ? Collection Time: 04/06/22  8:35 PM  ?Result Value Ref Range  ? Glucose-Capillary 149 (H) 70 - 99 mg/dL  ?  Comment: Glucose reference range applies only to samples taken after fasting for at least 8 hours.  ?Basic metabolic panel     Status: Abnormal  ? Collection Time: 04/07/22  5:41 AM  ?Result Value Ref Range  ? Sodium 135 135 - 145 mmol/L  ? Potassium 3.8 3.5 - 5.1 mmol/L  ? Chloride 101 98 - 111 mmol/L  ? CO2 22 22 - 32 mmol/L  ? Glucose, Bld 136 (H) 70 - 99 mg/dL  ?  Comment: Glucose reference range applies only to samples taken after fasting for at least 8 hours.  ? BUN 51 (H) 8 - 23 mg/dL  ? Creatinine, Ser 2.89 (H) 0.61 - 1.24 mg/dL  ? Calcium 8.4 (L) 8.9 - 10.3 mg/dL  ? GFR, Estimated 21 (L) >60 mL/min  ?  Comment: (NOTE) ?Calculated using the CKD-EPI Creatinine Equation (2021) ?  ? Anion gap 12 5 - 15  ?  Comment: Performed at [facility], [place] [address], [place], [place] [address]  ? ? ?ECG  ? ?N/A ? ?Telemetry  ? ?Sinus rhythm with PVC's - Personally Reviewed ? ?Radiology  ?  ?No results found. ? ?  Cardiac Studies  ? ?N/A ? ?Assessment  ? ?Principal Problem: ?  CAP (community acquired pneumonia) ?Active Problems: ?  HYPERLIPIDEMIA ?  HTN (hypertension) ?  CAD (coronary artery disease) ?  PVD ?  Hypothyroidism ?  Anemia ?  Pancreatic insufficiency ?  Asplenia after surgical procedure ?  Chronic kidney disease, stage IV (severe) ([place]) ?  Acute on chronic diastolic CHF (congestive heart failure) ([place]) ?  Hypertensive urgency ?  Metabolic acidosis ?  Hypokalemia ?  Demand ischemia ([place]) ?  Tricuspid regurgitation ?  Physical deconditioning ?  CHF (congestive heart failure) ([place]) ? ? ?Plan  ? ?Good diuresis yesterday, breathing has improved. Less urine output, but creatinine is better today. Got lasix today- will switch to oral diuretics tomorrow. Recommend torsemide for better bioavailability. Likely ok to d/c home then. BP remains elevated - amlodipine recently increased.  ? ?Time Spent Directly with Patient: ? ?I have spent a total of 25 minutes with the patient reviewing hospital notes, telemetry, EKGs, labs and examining the patient as well as establishing an assessment and plan that was discussed personally with the patient.  > 50% of time was spent in direct patient care. ? ?Length of Stay: ? LOS: 2 days  ? ?Pixie Casino, MD, [facility], FACP  ?[place]  ?Medical Director of the [facility] &  ?Cardiovascular [facility] ?Diplomate of the [employer] of Clinical Lipidology ?Attending Cardiologist  ?Direct Dial: [phone]  Fax: [phone]  ?Website:  [email or web address] ? ?Nadean Corwin Yuvraj Pfeifer ?04/07/2022, 10:13 AM ? ? ?

## 2022-04-07 NOTE — Progress Notes (Signed)
Physical Therapy Treatment ?Patient Details ?Name: Ethan Mcneil ?MRN: [id number] ?DOB: May 24, 1934 ?Today's Date: 04/07/2022 ? ? ?History of Present Illness 88 y.o. 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 Dx of acute on chronic CHF. ? ?  ?PT Comments  ? ? 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  Has 2 Son's that live near him. Pt also still drives.  Assisted OOB to amb in hallway went well.  General Gait Details: tolerated an increased distance.  Used a walker this session only for safety as pt does NOT did not not use prior.  Avg RA was 96% and no s/s dyspnea. Assisted back to bed and positioned to comfort.  Tolerated session well. ?Pt plans to return home with family support. ?  ?Recommendations for follow up therapy are one component of a multi-disciplinary discharge planning process, led by the attending physician.  Recommendations may be updated based on patient status, additional functional criteria and insurance authorization. ? ?Follow Up Recommendations ? No PT follow up ?  ?  ?Assistance Recommended at Discharge Set up Supervision/Assistance  ?Patient can return home with the following Direct supervision/assist for financial management ?  ?Equipment Recommendations ? None recommended by PT  ?  ?Recommendations for Other Services   ? ? ?  ?Precautions / Restrictions Precautions ?Precautions: Fall ?Restrictions ?Weight Bearing Restrictions: No  ?  ? ?Mobility ? Bed Mobility ?Overal bed mobility: Modified Independent ?  ?  ?   ?  ?  ?  ?General bed mobility comments: only required increased time ?  ? ?Transfers ?Overall transfer level: Modified independent ?Equipment used: None ?  ?  ?  ?  ?  ?  ?  ?General transfer comment: good use of hands to steady self and able to rise even from a lower surface level. ?  ? ?Ambulation/Gait ?Ambulation/Gait assistance: Supervision ?Gait Distance (Feet): 155 Feet ?Assistive device: Rolling walker (2 wheels) ?Gait Pattern/deviations: Step-through pattern ?Gait velocity: WFL ?  ?  ?General Gait Details: tolerated an increased distance.  Used a walker this session only for safety as pt does NOT did not not use prior.  Avg RA was 96% and no s/s dyspnea. ? ? ?Stairs ?  ?  ?  ?  ?  ? ? ?Wheelchair Mobility ?  ? ?Modified Rankin (Stroke Patients Only) ?  ? ? ?  ?Balance   ?  ?  ?  ?  ?  ?  ?  ?  ?  ?  ?  ?  ?  ?  ?  ?  ?  ?  ?  ? ?  ?Cognition Arousal/Alertness: Awake/alert ?Behavior During Therapy: [facility] for tasks assessed/performed ?Overall Cognitive Status: Within Functional Limits for tasks assessed ?  ?  ?  ?  ?  ?  ?  ?  ?  ?  ?  ?  ?  ?  ?  ?  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  Has 2 Son's that live near him. ?  ?  ? ?  ?  Exercises   ? ?  ?General Comments   ?  ?  ? ?Pertinent Vitals/Pain Pain Assessment ?Pain Assessment: No/denies pain  ? ? ?Home Living   ?  ?  ?  ?  ?  ?  ?  ?  ?  ?   ?  ?Prior Function    ?  ?  ?   ? ?PT Goals (current goals can now be found in the care plan section) Progress towards PT goals: Progressing toward goals ? ?  ?Frequency ? ? ? Min 3X/week ? ? ? ?  ?PT Plan Current plan remains appropriate  ? ? ?Co-evaluation   ?  ?  ?  ?  ? ?  ?AM-PAC PT "6 Clicks" Mobility   ?Outcome Measure ? Help needed turning from your back to your side while in a flat bed without using bedrails?: None ?Help needed moving from lying on your back to sitting on the side of a flat bed without using bedrails?: None ?Help  needed moving to and from a bed to a chair (including a wheelchair)?: None ?Help needed standing up from a chair using your arms (e.g., wheelchair or bedside chair)?: None ?Help needed to walk in hospital room?: A Little ?Help needed climbing 3-5 steps with a railing? : A Little ?6 Click Score: 22 ? ?  ?End of Session Equipment Utilized During Treatment: Gait belt ?Activity Tolerance: Patient tolerated treatment well ?Patient left: in bed;with bed alarm set;with call bell/phone within reach ?Nurse Communication: Mobility status ?PT Visit Diagnosis: Unsteadiness on feet (R26.81) ?  ? ? ?Time: 9323-5573 ?PT Time Calculation (min) (ACUTE ONLY): 11 min ? ?Charges:  $Gait Training: 8-22 mins          ?          ? ?Rica Koyanagi  PTA ?Acute  Rehabilitation Services ?Pager      [phone] ?Office      [phone] ? ? ?

## 2022-04-07 NOTE — Progress Notes (Signed)
?PROGRESS NOTE ?MALEKE Mcneil  PTW:[id number] DOB: December 24, 1933 DOA: 04/04/2022 ?PCP: Colon Branch, MD  ? ?Brief Narrative/Hospital Course: ? 88 y.o. 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  No vomiting diarrhea no sore throat or fever.  ?  ?ED Course: Blood pressure on higher side in 205/79 on presentation, no tachypnea or tachycardia or hypoxia.  Labs showed creatinine 2.5 from 2.49 on 3/30, BNP 710, troponin 52, lactic acid 0.7, WBC 11.3, COVID screen negative, blood cultures sent. ?CXR: Cardiomegaly with a small left pleural effusion and patchy opacities in the right lung may reflect asymmetric edema or infection.  Patient was given IV antibiotics ceftriaxone/azithromycin, oral diuretics, bronchodilators and admission requested for further management.  Patient being managed for acute on chronic heart failure along with pneumonia, improving on diuresis seen by cardiology  ? ?Subjective: ?Seen and examined this morning.  Swelling is improving still has some tightness wheezing this morning. ?Overnight no fever, BP stable.  Creatinine downtrending 2.8 ?UOP 1450 ? ?Assessment and Plan: ?Principal Problem: ?  CAP (community acquired pneumonia) ?Active Problems: ?  HYPERLIPIDEMIA ?  HTN (hypertension) ?  CAD (coronary artery disease) ?  PVD ?  Hypothyroidism ?  Anemia ?  Pancreatic insufficiency ?  Asplenia after surgical procedure ?  Chronic kidney disease, stage IV (severe) ([place]) ?  Acute on chronic diastolic CHF (congestive  heart failure) ([place]) ?  Hypertensive urgency ?  Metabolic acidosis ?  Hypokalemia ?  Demand ischemia ([place]) ?  Tricuspid regurgitation ?  Physical deconditioning ?  CHF (congestive heart failure) ([place]) ?  ?Frequent cough fatigue swelling ?Community-acquired pneumonia ?Acute on chronic diastolic CHF: ?Patient presents in likely combination of pneumonia and heart failure symptoms.  Leukocytosis resolved, will switch to oral antibiotics.  Continue diuresis per cardiology. ? ?Acute on chronic  diastolic CHF ?TR moderate to severe ?MR mild to moderate ?Pulmonary regurgitation moderate: ?Patient with chronic leg swelling but recently endorsing orthopnea symptoms.  Appreciate cardiology input on board Lasix was decreased to 80 mg daily from 80 twice daily due to elevated creatinine, this morning creatinine downtrending.Echo from January this year G2 DD with EF 55%.  Continue plan of care as per cardiology, monitor intake output Daily weight -which is overall stable, net negative close to 2 L.  Plan to switch to oral torsemide in the morning, BMP in a.m.  Net IO Since Admission: -1,377.39 mL [04/07/22 1036]  ?Filed Weights  ? 04/06/22 0500 04/06/22 0531 04/07/22 0500  ?Weight: 69.2 kg 69.2 kg 69.8 kg  ?  ?Chronic kidney disease, stage IV with metabolic acidosis: Slightly elevated creatinine now downtrending at 2.8.Continue oral bicarbonate.  Monitor daily BMP while on diuretics ?Recent Labs  ?Lab 04/04/22 ?1036 04/05/22 ?0457 04/06/22 ?7001 04/07/22 ?0541  ?BUN 41* 45* 51* 51*  ?CREATININE 2.56* 2.41* 3.02* 2.89*  ? ?Hypertension urgency: BP poorly controlled in 200 on admission likely contributing to his symptoms-BP fairly stable after adjusting meds, continue current amlodipine 10 mg,hydralazine 100 tid,Imdur 60 daily, metoprolol XL 50 daily.  Monitor and adjust.  ?  ?HLD ?CAD ?PVD: ?Demand ischemia with  elevated troponin: ?slightly positive troponin in the setting of CHF, stable no delta.  No chest pain.  Blood pressure  well controlled.  Continue aspirin Imdur simvastatin.   ? ?Hypokalemia resolved ?  ?Hypothyroidism: Continue home synthroid. ?   ?Type 2 diabetes mellitus: Well-controlled on home regimen, Semglee 7 units, SSI.Last A1c stable 6.5 on 12/21/21 ?Recent Labs  ?Lab 04/05/22 ?2200 04/06/22 ?0828 04/06/22 ?1145 04/06/22 ?1634 04/06/22 ?2035  ?GLUCAP 157* 287* 223* 214* 149*  ?  ?History of bradycardia:monitor in tele ?  ?Chronic anemia:monitor hh-stable. ?Recent Labs  ?Lab 04/04/22 ?1036 04/05/22 ?0457  ?HGB 12.2* 10.7*  ?HCT 36.4* 33.7*  ?   ?Pancreatic insufficiency-continue pancreatic enzymes.  ?Asplenia hx Continue antibiotics as #1 monitor.  Follow-up blood culture ?Deconditioning debility lives alone wife passed away, two-person assist, PT OT following closely.  Activity level 5. ? ?DVT prophylaxis: heparin injection 5,000 Units Start: 04/04/22 2200 ?Place TED hose Start: 04/04/22 1745 ?Code Status:   Code Status: DNR ?Family Communication: plan of care discussed with patient at bedside. ?Patient status is: Inpatient level of care: Telemetry  ?Remains inpatient because: For ongoing management of pneumonia and congestive heart failure exacerbation ?Patient currently not stable ? ?Dispo: The patient is from: Home ?           Anticipated disposition: Anticipate discharge home tomorrow if renal function stable ? ?Mobility Assessment (last 72 hours)   ? ? Mobility Assessment   ? ? Row Name 04/06/22 1200 04/05/22 1411 04/05/22 0900 04/04/22 1920 04/04/22 17:14:40  ? Does patient have an order for bedrest or is patient medically unstable -- -- No - Continue assessment No - Continue assessment No - Continue assessment  ? What is the highest level of mobility based on the progressive mobility assessment? Level 6 (Walks independently in room and hall) - Balance while walking in room without assist - Complete Level 5 (Walks with assist in room/hall) - Balance while stepping forward/back and can walk in room with assist - Complete  Level 5 (Walks with assist in room/hall) - Balance while stepping forward/back and can walk in room with assist - Complete Level 5 (Walks with assist in room/hall) - Balance while stepping forward/back and can walk in room with assist - Complete Level 5 (Walks with assist in room/hall) - Balance while stepping forward/back and can walk in room with assist - Complete  ? ?  ?  ? ?  ?  ? ?Objective: ?Vitals last 24 hrs: ?Vitals:  ? 04/06/22 1133 04/06/22 2033 04/07/22 0500 04/07/22 0526  ?BP:  (!) 149/58  (!) 154/64  ?Pulse: 61 (!) 55  (!) 59  ?Resp:  18  16  ?Temp:  98.3 ?F (36.8 ?C)  98.2 ?F (36.8 ?C)  ?TempSrc:  Oral  Oral  ?SpO2:  94%  95%  ?Weight:   69.8 kg   ?Height:      ? ?Weight change: 0.6 kg ? ?Physical Examination: ?General exam: AA0X3,older than stated age, weak appearing. ?HEENT:Oral mucosa moist, Ear/Nose WNL grossly, dentition normal. ?Respiratory system: bilaterally diminished,no use of accessory muscle ?Cardiovascular system: S1 & S2 +, No JVD,. ?Gastrointestinal system: Abdomen soft,NT,ND, BS+ ?Nervous System:Alert, awake, moving extremities and grossly nonfocal ?Extremities: edema on legs much better,distal peripheral pulses palpable.  ?Skin: No rashes,no icterus. ?MSK: Normal muscle bulk,tone, power ? ? ?Medications reviewed: Scheduled Meds: ? amLODipine  10 mg Oral Daily  ? aspirin EC  81 mg Oral QHS  ? azithromycin  500 mg Oral Daily  ? cholecalciferol  2,000 Units Oral Daily  ? heparin  5,000 Units Subcutaneous Q8H  ? hydrALAZINE  100 mg Oral TID  ? insulin aspart  0-6 Units Subcutaneous TID WC  ? insulin glargine-yfgn  7 Units Subcutaneous Daily  ? isosorbide mononitrate  60 mg Oral Daily  ? levothyroxine  150 mcg Oral QAC breakfast  ? lipase/protease/amylase  24,000 Units Oral Daily  ? metoprolol succinate  50 mg Oral Daily  ? pantoprazole  40 mg Oral Daily  ? simvastatin  20 mg Oral QHS  ? sodium bicarbonate  650 mg Oral BID  ? torsemide  40 mg Oral Daily  ? vitamin B-12  1,000 mcg Oral  Daily  ? ?Continuous Infusions: ? cefTRIAXone (ROCEPHIN)  IV 2 g (04/07/22 9528)  ? ? ?  ?Diet Order   ? ?       ?  Diet heart healthy/carb modified Room service appropriate? Yes; Fluid consistency: Thin  D

## 2022-04-07 NOTE — TOC CM/SW Note (Signed)
?  Transition of Care (TOC) Screening Note ? ? ?Patient Details  ?Name: Ethan Mcneil ?Date of Birth: 05/30/1934 ? ? ?Transition of Care (TOC) CM/SW Contact:    ?Ross Ludwig, LCSW ?Phone Number: ?04/07/2022, 4:42 PM ? ? ? ?Transition of Care Department [facility]) has reviewed patient and no TOC needs have been identified at this time. We will continue to monitor patient advancement through interdisciplinary progression rounds. If new patient transition needs arise, please place a TOC consult. ?  ?

## 2022-04-07 NOTE — Discharge Summary (Signed)
Physician Discharge Summary  ?Ethan Mcneil ERD:[id number] DOB: 1934-05-21 DOA: 04/04/2022 ? ?PCP: Colon Branch, MD ? ?Admit date: 04/04/2022 ?Discharge date: 04/08/2022 ?Recommendations for Outpatient Follow-up:  ?Follow up with PCP/cardiology  in 1 weeks-call for appointment ?Please obtain BMP/CBC in one week ? ?Discharge Dispo: Home ?Discharge Condition: Stable ?Code Status:   Code Status: DNR ?Diet recommendation:  ?Diet Order   ? ?       ?  Diet heart healthy/carb modified Room service appropriate? Yes; Fluid consistency: Thin  Diet effective now       ?  ? ?  ?  ? ?  ?  ? ?Brief/Interim Summary: ? 88 y.o. 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  No vomiting diarrhea no sore throat or fever.  ?  ?ED Course: Blood pressure on higher side in 205/79 on presentation, no tachypnea or tachycardia or hypoxia.  Labs showed creatinine 2.5 from 2.49 on 3/30, BNP 710, troponin 52, lactic acid 0.7, WBC 11.3, COVID screen negative, blood cultures sent. ?CXR: Cardiomegaly with a small left pleural effusion and patchy opacities in the right lung may reflect asymmetric edema or infection.  Patient was given IV antibiotics ceftriaxone/azithromycin, oral diuretics, bronchodilators and admission requested for further management.  Patient being managed for acute on chronic heart failure along with pneumonia, improving on diuresis seen by cardiology  ?At this time patient has significantly improved, remains afebrile.  Complete short course of  antibiotics, he was diuresed for heart failure has improved nicely.  Seen by cardiology.  Medication has been optimized, metoprolol dose decreased.  Heart rate was on the lower side, blood pressure at this time has improved.  Okay to discharge home on torsemide, he has a follow-up with his nephrology but advised to check his kidney function in a week particularly 5 days from his primary care doctor. ? ?Discharge Diagnoses:  ?Principal Problem: ?  CAP (community acquired pneumonia) ?Active Problems: ?  HYPERLIPIDEMIA ?  HTN (hypertension) ?  CAD (coronary artery disease) ?  PVD ?  Hypothyroidism ?  Anemia ?  Pancreatic insufficiency ?  Asplenia after surgical procedure ?  Chronic kidney disease, stage IV (severe) ([place]) ?  Acute on chronic diastolic CHF (congestive heart failure) ([place]) ?  Hypertensive urgency ?  Metabolic acidosis ?  Hypokalemia ?  Demand ischemia ([place]) ?  Tricuspid regurgitation ?  Physical deconditioning ?  CHF (congestive heart failure) ([place]) ? ?Frequent cough fatigue swelling ?Community-acquired pneumonia ?Acute on chronic diastolic CHF: ?Patient presents in likely combination of pneumonia and heart failure symptoms.  Leukocytosis resolved, will switch to oral antibiotics.  Continue diuresis per cardiology.  We will discharge him on proair inhaler for some wheezing aspirin ?  ?Acute on chronic  diastolic CHF ?TR moderate to severe ?MR mild to moderate ?Pulmonary regurgitation moderate: ?Patient with chronic leg swelling but recently endorsing orthopnea symptoms.  Appreciate cardiology input on board Lasix was decreased to 80 mg daily from 80 twice daily due to elevated creatinine, creatinine was slightly better at 2.8 but further up at 3.1 overall with the CKD stage IV  will need diuresis for his ongoing CHF management, he will need follow-up BMP checked.  Discussed with cardiology okay for discharge home ? ?Chronic kidney disease, stage IV with metabolic acidosis: Elevated at 3.1, being discharged  on diuretics torsemide with instruction for BMP check from PCP, also has appointment with his nephrology on 5/10.  Cardiology will follow-up outpatient.  Advised to see PCP sooner however. ?Recent Labs  ?Lab 04/04/22 ?1036 04/05/22 ?0457 04/06/22 ?1443 04/07/22 ?1540 04/08/22 ?0420  ?BUN 41* 45* 51* 51* 58*  ?CREATININE 2.56* 2.41* 3.02* 2.89* 3.11*  ? ?Hypertension urgency: BP poorly controlled in 200 on admission likely contributing to his symptoms-BP fairly stable after adjusting meds, continue current amlodipine 10 mg,hydralazine 100 tid,Imdur 60 daily, metoprolol XL 25 daily.  Monitor and adjust.  ?  ?HLD ?CAD ?PVD: ?Demand ischemia with elevated troponin: ?slightly positive troponin in the setting of CHF, stable no delta.  No chest pain.  Blood pressure well controlled.  Continue aspirin Imdur simvastatin.   ?  ?Hypokalemia resolved ?  ?Hypothyroidism: Continue home synthroid. ?   ?Type 2 diabetes mellitus: Well-controlled on home regimen, resume the same at home. Last A1c stable 6.5 on 12/21/21 ?Recent Labs  ?Lab 04/07/22 ?0715 04/07/22 ?1108 04/07/22 ?1626 04/07/22 ?2028 04/08/22 ?0750  ?GLUCAP 125* 297* 240* 156* 169*  ?   ?History of bradycardia:monitor in tele ?  ?Chronic anemia:monitor hh-stable ?Recent Labs  ?Lab 04/04/22 ?1036 04/05/22 ?0457  ?HGB 12.2* 10.7*  ?HCT 36.4* 33.7*  ?  ?Pancreatic insufficiency-continue pancreatic enzymes.  ?Asplenia hx Continue antibiotics as #1 monitor.  Follow-up blood culture ?Deconditioning debility lives alone wife passed away, two-person assist, PT OT following closely.  Activity level 5. ? ?Consults: ?Cardiology ? ?Subjective: ?Alert awake resting comfortably no more cough shortness of breath. ? ? ?Discharge Exam: ?Vitals:  ? 04/08/22 0555 04/08/22 1034  ?BP: (!) 160/62 (!) 161/59  ?Pulse: (!) 54 (!) 48  ?Resp: 18   ?Temp: 98 ?F (36.7 ?C)   ?SpO2: 94%   ? ?General: Pt is alert, awake, not in acute distress ?Cardiovascular: RRR, S1/S2 +, no rubs, no  gallops ?Respiratory: CTA bilaterally, no wheezing, no rhonchi ?Abdominal: Soft, NT, ND, bowel sounds + ?Extremities: no edema, no cyanosis ? ?Discharge Instructions ? ?Discharge Instructions   ? ? (HEART FAILURE PATIENTS) Call MD:  Anytime you have any of the following symptoms: 1) 3 pound weight gain in 24 hours or 5 pounds in 1 week 2) shortness of breath, with or without a dry hacking cough 3) swelling in the hands, feet or stomach 4) if you have to sleep on extra pillows at night in order to breathe.   Complete by: As directed ?  ? Discharge instructions   Complete by: As directed ?  ? Check Bmp with primary care doctor and follow-up with cardiology scheduled or sooner ? ?Please call call MD or return to ER for similar or worsening recurring problem that brought you to hospital or if any fever,nausea/vomiting,abdominal pain, uncontrolled pain, chest pain,  shortness of breath or any other alarming symptoms. ? ?Please follow-up your doctor as instructed in a week time and call the office for appointment. ? ?Please avoid alcohol, smoking, or any other illicit substance and maintain healthy habits including taking your regular medications as prescribed. ? ?You were cared for by a hospitalist during your hospital stay. If you have any questions about your discharge medications or the care you received while you were in the hospital after you are discharged, you  can call the unit and ask to speak with the hospitalist on call if the hospitalist that took care of you is not available. ? ?Once you are discharged, your primary care physician will handle any further medical issues. Please note that NO REFILLS for any discharge medications will be authorized once you are discharged, as it is imperative that you return to your primary care physician (or establish a relationship with a primary care physician if you do not have one) for your aftercare needs so that they can reassess your need for medications and monitor your  lab values  ? Increase activity slowly   Complete by: As directed ?  ? ?  ? ?Allergies as of 04/08/2022   ? ?   Reactions  ? Ciprofloxacin Itching  ? Codeine Nausea And Vomiting  ? ?  ? ?  ?Medication List  ?  ? ?STOP

## 2022-04-08 DIAGNOSIS — J189 Pneumonia, unspecified organism: Secondary | ICD-10-CM | POA: Diagnosis not present

## 2022-04-08 DIAGNOSIS — N184 Chronic kidney disease, stage 4 (severe): Secondary | ICD-10-CM | POA: Diagnosis not present

## 2022-04-08 DIAGNOSIS — I5033 Acute on chronic diastolic (congestive) heart failure: Secondary | ICD-10-CM | POA: Diagnosis not present

## 2022-04-08 LAB — BASIC METABOLIC PANEL
Anion gap: 13 (ref 5–15)
BUN: 58 mg/dL — ABNORMAL HIGH (ref 8–23)
CO2: 25 mmol/L (ref 22–32)
Calcium: 8.5 mg/dL — ABNORMAL LOW (ref 8.9–10.3)
Chloride: 98 mmol/L (ref 98–111)
Creatinine, Ser: 3.11 mg/dL — ABNORMAL HIGH (ref 0.61–1.24)
GFR, Estimated: 19 mL/min — ABNORMAL LOW (ref >60)
Glucose, Bld: 141 mg/dL — ABNORMAL HIGH (ref 70–99)
Potassium: 3.9 mmol/L (ref 3.5–5.1)
Sodium: 136 mmol/L (ref 135–145)

## 2022-04-08 LAB — GLUCOSE, CAPILLARY
Glucose-Capillary: 169 mg/dL — ABNORMAL HIGH (ref 70–99)
Glucose-Capillary: 298 mg/dL — ABNORMAL HIGH (ref 70–99)

## 2022-04-08 MED ORDER — METOPROLOL SUCCINATE ER 25 MG PO TB24: 25.0000 mg | ORAL_TABLET | Freq: Every day | ORAL | Status: DC

## 2022-04-08 MED ORDER — ALBUTEROL SULFATE HFA 108 (90 BASE) MCG/ACT IN AERS: 2.0000 | INHALATION_SPRAY | Freq: Four times a day (QID) | RESPIRATORY_TRACT | 2 refills | Status: DC | PRN

## 2022-04-08 MED ORDER — CEPHALEXIN 500 MG PO CAPS
500.0000 mg | ORAL_CAPSULE | Freq: Three times a day (TID) | ORAL | 0 refills | Status: AC
Start: 2022-04-08 — End: 2022-04-10

## 2022-04-08 MED ORDER — AZITHROMYCIN 500 MG PO TABS
500.0000 mg | ORAL_TABLET | Freq: Every day | ORAL | 0 refills | Status: AC
Start: 2022-04-08 — End: 2022-04-10

## 2022-04-08 MED ORDER — METOPROLOL SUCCINATE ER 25 MG PO TB24: 25.0000 mg | ORAL_TABLET | Freq: Every day | ORAL | 0 refills | Status: DC

## 2022-04-08 MED ORDER — TORSEMIDE 40 MG PO TABS
40.0000 mg | ORAL_TABLET | Freq: Every day | ORAL | 0 refills | Status: DC
Start: 2022-04-09 — End: 2022-04-16

## 2022-04-08 MED ORDER — AMLODIPINE BESYLATE 10 MG PO TABS: 10.0000 mg | ORAL_TABLET | Freq: Every day | ORAL | 0 refills | Status: DC

## 2022-04-08 NOTE — Care Management Important Message (Signed)
Important Message ? ?Patient Details IM Letter given to the Patient. ?Name: Ethan Mcneil ?MRN: [id number] ?Date of Birth: 08-Nov-1934 ? ? ?Medicare Important Message Given:  Yes ? ? ? ? ?Kerin Salen ?04/08/2022, 10:28 AM ?

## 2022-04-08 NOTE — Progress Notes (Signed)
Went over discharge instructions w/ pt. Pt verbalized understanding.  

## 2022-04-08 NOTE — Progress Notes (Addendum)
? ?Progress Note ? ?Patient Name: Ethan Mcneil ?Date of Encounter: 04/08/2022 ? ?[facility] Cardiologist: Minus Breeding, MD  ? ?Subjective  ? ?Patient states he is feeling well, denied any SOB or leg edema, still urinating good, has a nephrology appointment on 5/10 ? ?Inpatient Medications  ?  ?Scheduled Meds: ? amLODipine  10 mg Oral Daily  ? aspirin EC  81 mg Oral QHS  ? azithromycin  500 mg Oral Daily  ? cholecalciferol  2,000 Units Oral Daily  ? heparin  5,000 Units Subcutaneous Q8H  ? hydrALAZINE  100 mg Oral TID  ? insulin aspart  0-6 Units Subcutaneous TID WC  ? insulin glargine-yfgn  7 Units Subcutaneous Daily  ? isosorbide mononitrate  60 mg Oral Daily  ? levothyroxine  150 mcg Oral QAC breakfast  ? lipase/protease/amylase  24,000 Units Oral Daily  ? [START ON 04/09/2022] metoprolol succinate  25 mg Oral Daily  ? pantoprazole  40 mg Oral Daily  ? simvastatin  20 mg Oral QHS  ? sodium bicarbonate  650 mg Oral BID  ? torsemide  40 mg Oral Daily  ? vitamin B-12  1,000 mcg Oral Daily  ? ?Continuous Infusions: ? cefTRIAXone (ROCEPHIN)  IV 2 g (04/08/22 1038)  ? ?PRN Meds: ?acetaminophen **OR** acetaminophen, albuterol, guaiFENesin-dextromethorphan, hydrALAZINE, ondansetron **OR** ondansetron (ZOFRAN) IV  ? ?Vital Signs  ?  ?Vitals:  ? 04/07/22 2027 04/08/22 0500 04/08/22 0555 04/08/22 1034  ?BP: (!) 147/57  (!) 160/62 (!) 161/59  ?Pulse: (!) 46  (!) 54 (!) 48  ?Resp: 18  18   ?Temp: 98.1 ?F (36.7 ?C)  98 ?F (36.7 ?C)   ?TempSrc: Oral  Oral   ?SpO2: 96%  94%   ?Weight:  68.2 kg    ?Height:      ? ? ?Intake/Output Summary (Last 24 hours) at 04/08/2022 1117 ?Last data filed at 04/08/2022 0809 ?Gross per 24 hour  ?Intake 1200 ml  ?Output 2025 ml  ?Net -825 ml  ? ? ?  04/08/2022  ?  5:00 AM 04/07/2022  ?  5:00 AM 04/06/2022  ?  5:31 AM  ?Last 3 Weights  ?Weight (lbs) 150 lb 5.7 oz 153 lb 14.1 oz 152 lb 8.9 oz  ?Weight (kg) 68.2 kg 69.8 kg 69.2 kg  ?   ? ?Telemetry  ?  ?Sinus bradycardia 40-50s  - Personally  Reviewed ? ?ECG  ?  ?No new tracing - Personally Reviewed ? ?Physical Exam  ? ?GEN: No acute distress.   ?Neck: No JVD ?Cardiac: RRR, no murmurs, rubs, or gallops.  ?Respiratory: Clear to auscultation bilaterally. On room air.  ?GI: Soft, nontender, non-distended  ?MS: No BLE edema ?Neuro:  Nonfocal  ?Psych: Normal affect  ? ?Labs  ?  ?High Sensitivity Troponin:   ?Recent Labs  ?Lab 04/04/22 ?1036 04/04/22 ?1801 04/04/22 ?2045  ?TROPONINIHS 52* 42* 39*  ?   ?Chemistry ?Recent Labs  ?Lab 04/06/22 ?3154 04/07/22 ?0086 04/08/22 ?0420  ?NA 135 135 136  ?K 3.6 3.8 3.9  ?CL 100 101 98  ?CO2 '24 22 25  '$ ?GLUCOSE 144* 136* 141*  ?BUN 51* 51* 58*  ?CREATININE 3.02* 2.89* 3.11*  ?CALCIUM 8.4* 8.4* 8.5*  ?GFRNONAA 20* 21* 19*  ?ANIONGAP '11 12 13  '$ ?  ?Lipids No results for input(s): CHOL, TRIG, HDL, LABVLDL, LDLCALC, CHOLHDL in the last 168 hours.  ?Hematology ?Recent Labs  ?Lab 04/04/22 ?1036 04/05/22 ?0457  ?WBC 11.3* 9.9  ?RBC 4.02* 3.64*  ?HGB 12.2* 10.7*  ?  HCT 36.4* 33.7*  ?MCV 90.5 92.6  ?MCH 30.3 29.4  ?MCHC 33.5 31.8  ?RDW 16.2* 16.0*  ?PLT 332 334  ? ?Thyroid  ?Recent Labs  ?Lab 04/05/22 ?8315  ?TSH 1.785  ?  ?BNP ?Recent Labs  ?Lab 04/04/22 ?1036  ?BNP 710.8*  ?  ?DDimer No results for input(s): DDIMER in the last 168 hours.  ? ?Radiology  ?  ?No results found. ? ?Cardiac Studies  ? ?Echo from 12/29/21: ? ? 1. Left ventricular ejection fraction, by estimation, is 55%. The left  ?ventricle has normal function. The left ventricle has no regional wall  ?motion abnormalities. There is mild concentric left ventricular  ?hypertrophy. Left ventricular diastolic  ?parameters are consistent with Grade II diastolic dysfunction  ?(pseudonormalization).  ? 2. Right ventricular systolic function is normal. The right ventricular  ?size is normal. There is mildly elevated pulmonary artery systolic  ?pressure. The estimated right ventricular systolic pressure is 17.6 mmHg.  ? 3. Left atrial size was mildly dilated.  ? 4. The mitral  valve is abnormal. Mild to moderate mitral valve  ?regurgitation. There is pulmonary vein blunting without reversal. Study  ?may underestimate regurgitation severity (best seen long axis view). No  ?evidence of mitral stenosis.  ? 5. Tricuspid valve regurgitation is moderate to severe.  ? 6. The aortic valve is tricuspid. Aortic valve regurgitation is not  ?visualized.  ? 7. Pulmonic valve regurgitation is moderate.  ? 8. Mildly dilated pulmonary artery.  ? 9. The inferior vena cava is normal in size with greater than 50%  ?respiratory variability, suggesting right atrial pressure of 3 mmHg.  ? ?Comparison(s): Compared to outside report, tricuspid and mitral valve  ?regurgitation are worse.  ? ?Patient Profile  ?   ?Ethan Mcneil is a 88 y.o. 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 ? ?Assessment & Plan  ?  ?Acute on chronic diastolic heart failure ?Mild to moderate mitral regurgitation ?Moderate to severe tricuspid regurgitation ?Moderate pulmonic regurgitation ?- Presented with worsening fatigue, cough, orthopnea ?- Hs trop 75 >42 >39 which is consistent with demand ischemia and not ACS likely related to CHF  ?- BNP 710 (was 1726 on 12/28/21) ?- CXR concerning for atypical congestive heart failure versus pneumonia ?- Echo from 12/29/21 with EF 55%, grade II DD, mildly elevated RVSP 44.9mHg, mild to mod MR, mod to severe TR, mod PR ?- He was markedly volume overloaded with crackles at admission ?- He was on Lasix 60 mg daily at home, increased to IV Lasix 80 mg BID at admission, down titrated to '80mg'$  daily over the weekends,had good response with Net -2.2L  and weight loss 7 pounds (157 >150) since admission, now transitioned to PO torsemide '40mg'$  daily,  needs ongoing monitor for diuretic responses given advanced CKD  ?- GDMT: continue Toprol-XL 25 mg daily, hydralazine 100 mg TID, Imdur '60mg'$  daily  ?- outpatient follow up with cardiology arranged on 04/22/22, will need BMP repeat  ?  ?CAD ?- reports chronic stable angina with some exertion but that he thinks has been stable for months. ?- Minimal elevation in troponin with flat trend consistent with demand ischemia in the setting of acute CHF exacerbation and CKD ?-He is not a candidate for cardiac cath at this time given his  CKD and would recommend medical management ongoing ?-continue ASA, Toprol-XL 25 mg daily, amlodipine 10 mg daily and statin ?  ?HTN urgency ?-Markedly elevated blood pressure on admission which is improved now ?-Needs aggressive control of blood pressure  ?-Increased amlodipine to 10 mg daily  ?-Continue hydralazine 100 mg 3 times daily, Imdur 60 mg daily and Toprol-XL 25 mg daily and titrate as needed for blood pressure control  ?  ?CKD IV ?- renal index up-trended to Cr 3.11 and GFR 19 today  ?- need close follow up with nephrology outpatient and review the option of dialysis ? ?Sinus bradycardia ?- HR 40-50s while awake, no symptoms, reduced metoprolol to '25mg'$  daily today  ?  ?Suspected pneumonia ?Type 2 diabetes ?Chronic pancreatitis ?Hyperlipidemia ?-Managed per hospital medicine ? ?For questions or updates, please contact [place] ?Please consult [email or web address] for contact info under  ? ?  ?   ?Signed, ?Margie Billet, NP  ?04/08/2022, 11:17 AM   ? ?Patient examined chart reviewed. [employer] Walked with him in halls. Old patient of mine -[place] who lost his wife of 82 years in September has 4 boys that look out for him EF normal volume overload with CRF baseline Cr 3.0 History of CABG no chest pain and no evidence of ischemia. Toprol decreased for relative bradycardia. Continue lasix 40 mg daily and f/u with renal as outpatient  ? ?Jenkins Rouge MD [facility] ? ?

## 2022-04-09 ENCOUNTER — Telehealth: Payer: Self-pay

## 2022-04-09 LAB — CULTURE, BLOOD (ROUTINE X 2)
Culture: NO GROWTH
Culture: NO GROWTH
Special Requests: ADEQUATE
Special Requests: ADEQUATE

## 2022-04-09 NOTE — Telephone Encounter (Signed)
Transition Care Management Follow-up Telephone Call ?Date of discharge and from where: [place] 04-08-22 Dx: CAP ?How have you been since you were released from the hospital? Doing ok  ?Any questions or concerns? No ? ?Items Reviewed: ?Did the pt receive and understand the discharge instructions provided? Yes  ?Medications obtained and verified? Yes  ?Other? No  ?Any new allergies since your discharge? No  ?Dietary orders reviewed? Yes ?Do you have support at home? Yes  ? ?Home Care and Equipment/Supplies: ?Were home health services ordered? no ?If so, what is the name of the agency? na  ?Has the agency set up a time to come to the patient's home? not applicable ?Were any new equipment or medical supplies ordered?  no ?What is the name of the medical supply agency? na ?Were you able to get the supplies/equipment? not applicable ?Do you have any questions related to the use of the equipment or supplies? No ? ?Functional Questionnaire: (I = Independent and D = Dependent) ?ADLs: I ? ?Bathing/Dressing- I ? ?Meal Prep- I ? ?Eating- I ? ?Maintaining continence- I ? ?Transferring/Ambulation- I ? ?Managing Meds- I ? ?Follow up appointments reviewed: ? ?PCP Hospital f/u appt confirmed? Yes  Scheduled to see Dr Larose Kells on 04-15-22 @ 11am. ?[facility] f/u appt confirmed? Yes  Scheduled to see Dr Marilynn Rail on 04-22-22 @ 825am. ?Are transportation arrangements needed? No  ?If their condition worsens, is the pt aware to call PCP or go to the Emergency Dept.? Yes ?Was the patient provided with contact information for the PCP's office or ED? Yes ?Was to pt encouraged to call back with questions or concerns? Yes  ?

## 2022-04-15 ENCOUNTER — Encounter: Payer: Self-pay | Admitting: Internal Medicine

## 2022-04-15 ENCOUNTER — Ambulatory Visit (INDEPENDENT_AMBULATORY_CARE_PROVIDER_SITE_OTHER): Payer: Medicare HMO | Admitting: Internal Medicine

## 2022-04-15 VITALS — BP 144/60 | HR 53 | Temp 98.2°F | Resp 18 | Ht 70.0 in | Wt 151.1 lb

## 2022-04-15 DIAGNOSIS — Z794 Long term (current) use of insulin: Secondary | ICD-10-CM

## 2022-04-15 DIAGNOSIS — N189 Chronic kidney disease, unspecified: Secondary | ICD-10-CM | POA: Diagnosis not present

## 2022-04-15 DIAGNOSIS — J189 Pneumonia, unspecified organism: Secondary | ICD-10-CM

## 2022-04-15 DIAGNOSIS — E1151 Type 2 diabetes mellitus with diabetic peripheral angiopathy without gangrene: Secondary | ICD-10-CM | POA: Diagnosis not present

## 2022-04-15 LAB — COMPREHENSIVE METABOLIC PANEL
ALT: 21 U/L (ref 0–53)
AST: 21 U/L (ref 0–37)
Albumin: 3.9 g/dL (ref 3.5–5.2)
Alkaline Phosphatase: 82 U/L (ref 39–117)
BUN: 69 mg/dL — ABNORMAL HIGH (ref 6–23)
CO2: 23 mEq/L (ref 19–32)
Calcium: 8.7 mg/dL (ref 8.4–10.5)
Chloride: 95 mEq/L — ABNORMAL LOW (ref 96–112)
Creatinine, Ser: 3.5 mg/dL — ABNORMAL HIGH (ref 0.40–1.50)
GFR: 15.24 mL/min — ABNORMAL LOW (ref >60.00)
Glucose, Bld: 115 mg/dL — ABNORMAL HIGH (ref 70–99)
Potassium: 4.4 mEq/L (ref 3.5–5.1)
Sodium: 132 mEq/L — ABNORMAL LOW (ref 135–145)
Total Bilirubin: 0.5 mg/dL (ref 0.2–1.2)
Total Protein: 7.1 g/dL (ref 6.0–8.3)

## 2022-04-15 LAB — CBC WITH DIFFERENTIAL/PLATELET
Basophils Absolute: 0.2 10*3/uL — ABNORMAL HIGH (ref 0.0–0.1)
Basophils Relative: 2.9 % (ref 0.0–3.0)
Eosinophils Absolute: 0.8 10*3/uL — ABNORMAL HIGH (ref 0.0–0.7)
Eosinophils Relative: 13.2 % — ABNORMAL HIGH (ref 0.0–5.0)
HCT: 33.6 % — ABNORMAL LOW (ref 39.0–52.0)
Hemoglobin: 10.7 g/dL — ABNORMAL LOW (ref 13.0–17.0)
Lymphocytes Relative: 31.5 % (ref 12.0–46.0)
Lymphs Abs: 2 10*3/uL (ref 0.7–4.0)
MCHC: 31.8 g/dL (ref 30.0–36.0)
MCV: 92.8 fl (ref 78.0–100.0)
Monocytes Absolute: 1 10*3/uL (ref 0.1–1.0)
Monocytes Relative: 16.2 % — ABNORMAL HIGH (ref 3.0–12.0)
Neutro Abs: 2.3 10*3/uL (ref 1.4–7.7)
Neutrophils Relative %: 36.2 % — ABNORMAL LOW (ref 43.0–77.0)
Platelets: 379 10*3/uL (ref 150.0–400.0)
RBC: 3.62 Mil/uL — ABNORMAL LOW (ref 4.22–5.81)
RDW: 16.2 % — ABNORMAL HIGH (ref 11.5–15.5)
WBC: 6.4 10*3/uL (ref 4.0–10.5)

## 2022-04-15 LAB — HEMOGLOBIN A1C: Hgb A1c MFr Bld: 8 % — ABNORMAL HIGH (ref 4.6–6.5)

## 2022-04-15 NOTE — Assessment & Plan Note (Signed)
Pneumonia, acute on chronic CHF, CKD ?Admitted to hospital recently with above problems. ?Feeling better, still fatigue and DOE, not unexpected. ?Was switched from Lasix to torsemide . He already finished antibiotics. ?Plan: ?CMP, CBC. Further advised with results, may need to decrease diuretics depending on BMP. ? Consider chest x-ray when he comes back in 2 months. ?To see cardiology next week, reports he also has a nephrology appointment soon. ?HTN: Was elevated before hospital admission, at home is in the 140s over 60s.  Continue amlodipine, hydralazine, Imdur, metoprolol, torsemide; monitor BPs at home. ?DM: On NovoLog 6 units TID and Basaglar 7 units qhs.  CBG goals provided.  Check A1c ?Preventive care: He is due for a Bexsero, will provide at the next visit. ?RTC 2 months ?

## 2022-04-15 NOTE — Patient Instructions (Addendum)
Check the  blood pressure regularly ?BP GOAL is between 110/65 and  140/85. ?If it is consistently higher or lower, let me know ? ?Diabetes: ?Check your blood sugar at different times  ?- early in AM fasting  ( goal 70-130) ?- 2 hours after a meal (goal less than 180) ?- bedtime (goal 90-150) ?Please let us know what your blood sugar is frequently not at goal  ? ? ?GO TO THE LAB : Get the blood work   ? ? ?[place], [place] ?Come back for a checkup in 2 months ?

## 2022-04-15 NOTE — Progress Notes (Signed)
? ?Subjective:  ? ? Patient ID: Ethan Mcneil, male    DOB: June 30, 1934, 88 y.o.   MRN: [id number] ? ?DOS:  04/15/2022 ?Type of visit - description: TCM 14 ? ?Admitted to hospital and discharged 04/08/2022. ?He was diagnosed with pneumonia ?Also acute on chronic diastolic CHF, increased troponins felt to be due to demand ischemia ?Received parenteral and subsequently oral antibiotics.  Lasix switch to torsemide. ?Creatinine went up with diuresis. ?BP was elevated. ?Had hypokalemia. ? ? ?Review of Systems ?Since he left the hospital he is overall feeling better. ?Still have some generalized weakness and mild DOE. ?No fever or chills. ?No shortness of breath at rest ?Very mild cough. ?Good med compliance. ?Edema is much improved. ? ?Wt Readings from Last 3 Encounters:  ?04/15/22 151 lb 2 oz (68.5 kg)  ?04/08/22 150 lb 5.7 oz (68.2 kg)  ?04/04/22 157 lb 6 oz (71.4 kg)  ? ? ? ?Past Medical History:  ?Diagnosis Date  ? Anemia   ? Arthritis   ? CAD (coronary artery disease)   ? CABG 1997; grafts patent @ cath [facility] 11/09  ? DM2 (diabetes mellitus, type 2) ([place])   ? Gout   ? after tick bite  ? History of kidney stones   ? HLD (hyperlipidemia)   ? HTN (hypertension)   ? essential nos  ? Hypothyroid   ? Pancreatic lesion   ? Postsurgical aortocoronary bypass status   ? PVD (peripheral vascular disease) ([place])   ? S/P herniorrhaphy   ? Tick fever 2012  ? [facility] [place]  ? Unspecified hyperplasia of prostate without urinary obstruction and other lower urinary tract symptoms (LUTS)   ? Urolithiasis 2015  ? hx of  ? Wears dentures   ? Wears glasses   ? ? ?Past Surgical History:  ?Procedure Laterality Date  ? BIOPSY  08/07/2020  ? Procedure: BIOPSY;  Surgeon: Rush Landmark Telford Nab., MD;  Location: [place];  Service: Gastroenterology;;  ? CATARACT EXTRACTION Bilateral   ? CORONARY ARTERY BYPASS GRAFT    ? x7 vessels 1997; cath 2002 and 2009; grafts patent   ? CYSTOSCOPY WITH RETROGRADE PYELOGRAM, URETEROSCOPY AND  STENT PLACEMENT Right 02/21/2014  ? Procedure: CYSTOSCOPY WITH RIGHT  RETROGRADE PYELOGRAM,RIGHT  URETEROSCOPY WITH STONE BASKETING EXTRACTION;  Surgeon: Irine Seal, MD;  [facility];  Service: Urology;  Laterality: Right;  ? ESOPHAGOGASTRODUODENOSCOPY (EGD) WITH PROPOFOL N/A 08/07/2020  ? Procedure: ESOPHAGOGASTRODUODENOSCOPY (EGD) WITH PROPOFOL;  Surgeon: Rush Landmark Telford Nab., MD;  Location: [place];  Service: Gastroenterology;  Laterality: N/A;  ? EUS N/A 08/07/2020  ? Procedure: UPPER ENDOSCOPIC ULTRASOUND (EUS) LINEAR;  Surgeon: Rush Landmark Telford Nab., MD;  Location: [place];  Service: Gastroenterology;  Laterality: N/A;  ? FINE NEEDLE ASPIRATION N/A 08/07/2020  ? Procedure: FINE NEEDLE ASPIRATION (FNA) LINEAR;  Surgeon: Irving Copas., MD;  Location: [place];  Service: Gastroenterology;  Laterality: N/A;  ? HERNIA REPAIR    ? INGUINAL HERNIA REPAIR    ? MULTIPLE TOOTH EXTRACTIONS    ? SHOULDER SURGERY    ? SPLENECTOMY, TOTAL N/A 10/26/2020  ? Procedure: SPLENECTOMY;  Surgeon: Stark Klein, MD;  Location: [place];  Service: General;  Laterality: N/A;  ? ? ?Current Outpatient Medications  ?Medication Instructions  ? acetaminophen (TYLENOL) 500 mg, Oral, Every 4 hours PRN  ? albuterol (VENTOLIN HFA) 108 (90 Base) MCG/ACT inhaler 2 puffs, Inhalation, Every 6 hours PRN  ? amLODipine (NORVASC) 10 mg, Oral, Daily  ? aspirin EC 81 mg, Oral,  Daily at bedtime  ? B-12 1,000 mcg, Oral, Daily  ? B-D UF III MINI PEN NEEDLES 31G X 5 MM MISC USE 3 TIMES A DAY AS DIRECTED  ? Basaglar KwikPen 6 Units, Subcutaneous, Daily  ? Continuous Blood Gluc Receiver (FREESTYLE LIBRE 2 READER) DEVI Use as instructed to check blood sugar  ? Continuous Blood Gluc Sensor (FREESTYLE LIBRE 14 DAY SENSOR) MISC USE 1 SENSOR EVERY 14 (FOURTEEN) DAYS.  ? FREESTYLE LITE test strip CHECK BLOOD SUGAR NO MORE THAN TWICE DAILY.  ? hydrALAZINE (APRESOLINE) 100 mg, Oral, 3 times daily  ? insulin aspart (NOVOLOG FLEXPEN) 100  UNIT/ML FlexPen 3 times a day (just before each meal), 5-6-6 units.  ? isosorbide mononitrate (IMDUR) 60 mg, Oral, Daily  ? levothyroxine (SYNTHROID) 150 MCG tablet TAKE 1 TABLET BY MOUTH DAILY BEFORE BREAKFAST.  ? metoprolol succinate (TOPROL-XL) 25 mg, Oral, Daily  ? nitroGLYCERIN (NITROSTAT) 0.4 mg, Sublingual, Every 5 min x3 PRN  ? omeprazole (PRILOSEC) 40 mg, Oral, Daily  ? Pancrelipase, Lip-Prot-Amyl, (ZENPEP) 25000-79000 units CPEP Take 2 capsules by mouth with every meal and snacks  ? simvastatin (ZOCOR) 20 MG tablet TAKE 1 TABLET BY MOUTH EVERYDAY AT BEDTIME  ? sodium bicarbonate 650 mg, Oral, 2 times daily  ? Torsemide 40 mg, Oral, Daily  ? Vitamin D3 2,000 Units, Oral, Daily  ? ? ?   ?Objective:  ? Physical Exam ?BP (!) 144/60 (BP Location: Left Arm, Patient Position: Sitting, Cuff Size: Small)   Pulse (!) 53   Temp 98.2 ?F (36.8 ?C) (Oral)   Resp 18   Ht '5\' 10"'$  (1.778 m)   Wt 151 lb 2 oz (68.5 kg)   SpO2 95%   BMI 21.68 kg/m?  ?General:   ?Well developed, NAD, BMI noted. ?HEENT:  ?Normocephalic . Face symmetric, atraumatic ?Lungs:  ?CTA B ?Normal respiratory effort, no intercostal retractions, no accessory muscle use. ?Heart: RRR,  no murmur.  ?Lower extremities: Trace edema, mostly on the left ?Skin: Not pale. Not jaundice ?Neurologic:  ?alert & oriented X3.  ?Speech normal, gait appropriate for age and unassisted ?Psych--  ?Cognition and judgment appear intact.  ?Cooperative with normal attention span and concentration.  ?Behavior appropriate. ?No anxious or depressed appearing.  ? ?   ?Assessment   ? ? Assessment ?DM -- w/ CAD, no neuropathy, intolerant to metformin , sees ENDO ?HTN ?Hyperkalemia-- off  ACEs ?Hyperlipidemia ?Hypothyroidism ?DJD ?BPH, LUTS,h/o urolithiasis -- sees urology ?CKD: started to see renal 06/2020 ?Anemia  ?CV: ?--CAD, CABG 1997, cardiac cath 2009 ?--PVD (no ABIs in the chart) ?--L leg edema, chronic, (-) DVT per [place] 2015 ?-- CHF ?Gout ?GI: ?Pancreatic insufficiency dx  02-2017 ?B 12 deficiency, mild 2015  ?Wt loss:  ?2012 187 lb, 2014 173 lb , 2015 167 ?01-2017: EGD normal, BX chronic active gastritis. Colonoscopy normal. ?Saw GI 02-2017: likely d/t Pancreatic insufficiency and poorly controlled diabetes. 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  Surgeries 10-2020 ?Asplenia ? ? ?PLAN: ?TCM 14 ?Pneumonia, acute on chronic CHF, CKD ?Admitted to hospital recently with above problems. ?Feeling better, still fatigue and DOE, not unexpected. ?Was switched from Lasix to torsemide . He already finished antibiotics. ?Plan: ?CMP, CBC. Further advised with results, may need to decrease diuretics depending on BMP. ? Consider chest x-ray when he comes back in 2 months. ?To see cardiology next week, reports he also  has a nephrology appointment soon. ?HTN: Was elevated before hospital admission, at home is in the 140s over 60s.  Continue amlodipine, hydralazine, Imdur, metoprolol, torsemide; monitor BPs at home. ?DM: On NovoLog 6 units TID and Basaglar 7 units qhs.  CBG goals provided.  Check A1c ?Preventive care: He is due for a Bexsero, will provide at the next visit. ?RTC 2 months ?  ?

## 2022-04-16 NOTE — Addendum Note (Signed)
Addended byDamita Dunnings D on: 04/16/2022 10:04 AM ? ? Modules accepted: Orders ? ?

## 2022-04-17 DIAGNOSIS — N184 Chronic kidney disease, stage 4 (severe): Secondary | ICD-10-CM | POA: Diagnosis not present

## 2022-04-17 LAB — BASIC METABOLIC PANEL
BUN: 62 — AB (ref 4–21)
CO2: 20 (ref 13–22)
Chloride: 96 — AB (ref 99–108)
Creatinine: 3.1 — AB (ref 0.6–1.3)
Glucose: 157
Potassium: 4.6 mEq/L (ref 3.5–5.1)
Sodium: 135 — AB (ref 137–147)

## 2022-04-17 LAB — IRON,TIBC AND FERRITIN PANEL
%SAT: 26
Ferritin: 433
Iron: 68
TIBC: 264
UIBC: 196

## 2022-04-17 LAB — CBC AND DIFFERENTIAL
HCT: 33 — AB (ref 41–53)
Hemoglobin: 11 — AB (ref 13.5–17.5)
Neutrophils Absolute: 3
Platelets: 335 10*3/uL (ref 150–400)
WBC: 7.1

## 2022-04-17 LAB — CBC: RBC: 3.68 — AB (ref 3.87–5.11)

## 2022-04-17 LAB — COMPREHENSIVE METABOLIC PANEL
Albumin: 4.4 (ref 3.5–5.0)
eGFR: 19

## 2022-04-17 LAB — VITAMIN D 25 HYDROXY (VIT D DEFICIENCY, FRACTURES): Vit D, 25-Hydroxy: 30

## 2022-04-19 NOTE — Progress Notes (Signed)
? ?Cardiology Clinic Note  ? ?Patient Name: Ethan Mcneil ?Date of Encounter: 04/22/2022 ? ?Primary Care Provider:  Colon Branch, MD ?Primary Cardiologist:  Minus Breeding, MD ? ?Patient Profile  ?  ?Ethan Mcneil 88 year old male presents to the clinic today for follow-up evaluation of his hypertension, CHF, and coronary artery disease. ? ?Past Medical History  ?  ?Past Medical History:  ?Diagnosis Date  ? Anemia   ? Arthritis   ? CAD (coronary artery disease)   ? CABG 1997; grafts patent @ cath [facility] 11/09  ? DM2 (diabetes mellitus, type 2) ([place])   ? Gout   ? after tick bite  ? History of kidney stones   ? HLD (hyperlipidemia)   ? HTN (hypertension)   ? essential nos  ? Hypothyroid   ? Pancreatic lesion   ? Postsurgical aortocoronary bypass status   ? PVD (peripheral vascular disease) ([place])   ? S/P herniorrhaphy   ? Tick fever 2012  ? [facility] , [place]  ? Unspecified hyperplasia of prostate without urinary obstruction and other lower urinary tract symptoms (LUTS)   ? Urolithiasis 2015  ? hx of  ? Wears dentures   ? Wears glasses   ? ?Past Surgical History:  ?Procedure Laterality Date  ? BIOPSY  08/07/2020  ? Procedure: BIOPSY;  Surgeon: Rush Landmark Telford Nab., MD;  Location: [place];  Service: Gastroenterology;;  ? CATARACT EXTRACTION Bilateral   ? CORONARY ARTERY BYPASS GRAFT    ? x7 vessels 1997; cath 2002 and 2009; grafts patent   ? CYSTOSCOPY WITH RETROGRADE PYELOGRAM, URETEROSCOPY AND STENT PLACEMENT Right 02/21/2014  ? Procedure: CYSTOSCOPY WITH RIGHT  RETROGRADE PYELOGRAM,RIGHT  URETEROSCOPY WITH STONE BASKETING EXTRACTION;  Surgeon: Irine Seal, MD;  [facility];  Service: Urology;  Laterality: Right;  ? ESOPHAGOGASTRODUODENOSCOPY (EGD) WITH PROPOFOL N/A 08/07/2020  ? Procedure: ESOPHAGOGASTRODUODENOSCOPY (EGD) WITH PROPOFOL;  Surgeon: Rush Landmark Telford Nab., MD;  Location: [place];  Service: Gastroenterology;  Laterality: N/A;  ? EUS N/A 08/07/2020  ? Procedure: UPPER  ENDOSCOPIC ULTRASOUND (EUS) LINEAR;  Surgeon: Rush Landmark Telford Nab., MD;  Location: [place];  Service: Gastroenterology;  Laterality: N/A;  ? FINE NEEDLE ASPIRATION N/A 08/07/2020  ? Procedure: FINE NEEDLE ASPIRATION (FNA) LINEAR;  Surgeon: Irving Copas., MD;  Location: [place];  Service: Gastroenterology;  Laterality: N/A;  ? HERNIA REPAIR    ? INGUINAL HERNIA REPAIR    ? MULTIPLE TOOTH EXTRACTIONS    ? SHOULDER SURGERY    ? SPLENECTOMY, TOTAL N/A 10/26/2020  ? Procedure: SPLENECTOMY;  Surgeon: Stark Klein, MD;  Location: [place];  Service: General;  Laterality: N/A;  ? ? ?Allergies ? ?Allergies  ?Allergen Reactions  ? Ciprofloxacin Itching  ? Codeine Nausea And Vomiting  ? ? ?History of Present Illness  ?  ?Ethan Mcneil has a PMH of hypertension, coronary artery disease, PVD, acute combined systolic and diastolic CHF, demand ischemia, hypertensive urgency, community-acquired pneumonia, CHF, DJD, diabetes, HLD, bradycardia and CKD stage IV. ? ? ?He presented to the emergency department 04/08/22 with complaints of cough and fatigue that had been ongoing for 2 weeks.  He was also noted to have lower extremity swelling.  His blood pressure was 205/79.  His COVID screen was negative.  His BNP was 710.  Troponin was 52 with a lactic acid of 0.7.  His chest x-ray showed small left pleural effusion with patchy opacities.  He was treated with IV antibiotics, oral diuresis, bronchodilators and admitted.  He was  diagnosed with acute on chronic CHF and community-acquired pneumonia.  He significantly improved.  His metoprolol was decreased due to decreased heart rate.  He was discharged home on torsemide and instructed to follow-up with nephrology and cardiology. ? ?He presents to the clinic today for follow-up evaluation states he is somewhat fatigued since leaving the hospital.  We reviewed his recent hospital admission and he expressed understanding.  His blood pressure initially today is 140/50 and  on recheck is 134/50.  He presents with lower extremity support stockings.  He is fasting today as well.  He reports that he has been to [facility] and they are helping to manage his renal function.  We reviewed the importance of low-sodium diet and daily weights.  He expressed understanding.  I will give him a salty 6 diet sheet, have him increase his physical activity as tolerated, order fasting lipids and LFTs today and plan follow-up in 2 to 3 months. ? ?Today he denies chest pain, shortness of breath, lower extremity edema,  palpitations, melena, hematuria, hemoptysis, diaphoresis, weakness, presyncope, syncope, orthopnea, and PND. ? ?Home Medications  ?  ?Prior to Admission medications   ?Medication Sig Start Date End Date Taking? Authorizing Provider  ?acetaminophen (TYLENOL) 500 MG tablet Take 500 mg by mouth every 4 (four) hours as needed for moderate pain.    [provider]  ?albuterol (VENTOLIN HFA) 108 (90 Base) MCG/ACT inhaler Inhale 2 puffs into the lungs every 6 (six) hours as needed for wheezing or shortness of breath. 04/08/22   Antonieta Pert, MD  ?amLODipine (NORVASC) 10 MG tablet Take 1 tablet (10 mg total) by mouth daily. 04/09/22 05/09/22  Antonieta Pert, MD  ?aspirin EC 81 MG tablet Take 81 mg by mouth at bedtime.    [provider]  ?B-D UF III MINI PEN NEEDLES 31G X 5 MM MISC USE 3 TIMES A DAY AS DIRECTED 08/20/21   Renato Shin, MD  ?Cholecalciferol (VITAMIN D3) 50 MCG (2000 UT) capsule Take 2,000 Units by mouth daily.    [provider]  ?Continuous Blood Gluc Receiver (FREESTYLE LIBRE 2 READER) DEVI Use as instructed to check blood sugar ?Patient not taking: Reported on 04/09/2022 01/07/22   Renato Shin, MD  ?Continuous Blood Gluc Sensor (FREESTYLE LIBRE 14 DAY SENSOR) MISC USE 1 SENSOR EVERY 14 (FOURTEEN) DAYS. ?Patient not taking: Reported on 04/09/2022 01/07/22   Renato Shin, MD  ?Cyanocobalamin (B-12) 1000 MCG TABS Take 1,000 mcg by mouth daily.    [provider]  ?FREESTYLE LITE test strip CHECK BLOOD SUGAR NO MORE THAN TWICE DAILY. 12/18/21   Renato Shin, MD  ?hydrALAZINE (APRESOLINE) 100 MG tablet Take 100 mg by mouth 3 (three) times daily. 07/30/21   [provider]  ?insulin aspart (NOVOLOG FLEXPEN) 100 UNIT/ML FlexPen 3 times a day (just before each meal), 5-6-6 units. ?Patient taking differently: 6 Units 3 (three) times daily with meals. 12/25/21   Renato Shin, MD  ?Insulin Glargine [facility]) 100 UNIT/ML Inject 6 Units into the skin daily. ?Patient taking differently: Inject 7 Units into the skin daily. 12/06/21   Renato Shin, MD  ?isosorbide mononitrate (IMDUR) 60 MG 24 hr tablet Take 1 tablet (60 mg total) by mouth daily. 03/14/22 06/12/22  Almyra Deforest, PA  ?levothyroxine (SYNTHROID) 150 MCG tablet TAKE 1 TABLET BY MOUTH DAILY BEFORE BREAKFAST. ?Patient taking differently: Take 150 mcg by mouth daily before breakfast. 02/25/22   Colon Branch, MD  ?metoprolol succinate (TOPROL-XL) 25 MG 24 hr  tablet Take 1 tablet (25 mg total) by mouth daily. 04/09/22 05/09/22  Antonieta Pert, MD  ?nitroGLYCERIN (NITROSTAT) 0.4 MG SL tablet Place 1 tablet (0.4 mg total) under the tongue every 5 (five) minutes x 3 doses as needed for chest pain. ?Patient not taking: Reported on 04/15/2022 01/01/22   Margie Billet, PA-C  ?omeprazole (PRILOSEC) 40 MG capsule Take 1 capsule (40 mg total) by mouth daily. 04/20/21   Shelda Pal, DO  ?Pancrelipase, Lip-Prot-Amyl, (ZENPEP) 25000-79000 units CPEP Take 2 capsules by mouth with every meal and snacks ?Patient taking differently: Take 1 capsule by mouth daily at 12 noon. 03/13/21   Colon Branch, MD  ?simvastatin (ZOCOR) 20 MG tablet TAKE 1 TABLET BY MOUTH EVERYDAY AT BEDTIME ?Patient taking differently: Take 20 mg by mouth at bedtime. 12/06/21   Colon Branch, MD  ?sodium bicarbonate 650 MG tablet Take 650 mg by mouth 2 (two) times daily. 10/15/21   [provider]  ?Torsemide 40 MG TABS Take 20 mg by  mouth daily. 04/16/22   Colon Branch, MD  ? ? ?Family History  ?  ?Family History  ?Problem Relation Age of Onset  ? Lung cancer Father   ? Coronary artery disease Mother   ?     stent  ? Diabetes Brother   ? Prostate

## 2022-04-22 ENCOUNTER — Encounter: Payer: Self-pay | Admitting: General Practice

## 2022-04-22 ENCOUNTER — Ambulatory Visit: Payer: Medicare HMO | Admitting: General Practice

## 2022-04-22 VITALS — BP 134/50 | HR 57 | Ht 70.0 in | Wt 161.2 lb

## 2022-04-22 DIAGNOSIS — I5031 Acute diastolic (congestive) heart failure: Secondary | ICD-10-CM

## 2022-04-22 DIAGNOSIS — I25810 Atherosclerosis of coronary artery bypass graft(s) without angina pectoris: Secondary | ICD-10-CM

## 2022-04-22 DIAGNOSIS — I10 Essential (primary) hypertension: Secondary | ICD-10-CM | POA: Diagnosis not present

## 2022-04-22 DIAGNOSIS — E785 Hyperlipidemia, unspecified: Secondary | ICD-10-CM | POA: Diagnosis not present

## 2022-04-22 LAB — HEPATIC FUNCTION PANEL
ALT: 36 IU/L (ref 0–44)
AST: 30 IU/L (ref 0–40)
Albumin: 4.2 g/dL (ref 3.6–4.6)
Alkaline Phosphatase: 105 IU/L (ref 44–121)
Bilirubin Total: 0.4 mg/dL (ref 0.0–1.2)
Bilirubin, Direct: 0.16 mg/dL (ref 0.00–0.40)
Total Protein: 7 g/dL (ref 6.0–8.5)

## 2022-04-22 LAB — LIPID PANEL
Chol/HDL Ratio: 2 ratio (ref 0.0–5.0)
Cholesterol, Total: 116 mg/dL (ref 100–199)
HDL: 57 mg/dL (ref >39)
LDL Chol Calc (NIH): 45 mg/dL (ref 0–99)
Triglycerides: 69 mg/dL (ref 0–149)
VLDL Cholesterol Cal: 14 mg/dL (ref 5–40)

## 2022-04-22 NOTE — Patient Instructions (Signed)
Medication Instructions:  ?Your physician recommends that you continue on your current medications as directed. Please refer to the Current Medication list given to you today. ?*If you need a refill on your cardiac medications before your next appointment, please call your pharmacy* ? ? ?Lab Work: ?TODAY-LIPIDS, LFT ?If you have labs (blood work) drawn today and your tests are completely normal, you will receive your results only by: ?MyChart Message (if you have MyChart) OR ?A paper copy in the mail ?If you have any lab test that is abnormal or we need to change your treatment, we will call you to review the results. ? ? ?Testing/Procedures: ?NONE ORDERED ? ? ?Follow-Up: ?At [facility], you and your health needs are our priority.  As part of our continuing mission to provide you with exceptional heart care, we have created designated Provider Care Teams.  These Care Teams include your primary Cardiologist (physician) and Advanced Practice Providers (APPs -  Physician Assistants and Nurse Practitioners) who all work together to provide you with the care you need, when you need it. ? ?We recommend signing up for the patient portal called "MyChart".  Sign up information is provided on this After Visit Summary.  MyChart is used to connect with patients for Virtual Visits (Telemedicine).  Patients are able to view lab/test results, encounter notes, upcoming appointments, etc.  Non-urgent messages can be sent to your provider as well.   ?To learn more about what you can do with MyChart, go to [email or web address].   ? ?Your next appointment:   ?2 month(s) ? ?The format for your next appointment:   ?In Person ? ?Provider:   ?Minus Breeding, MD   ? ? ?Other Instructions ?SALTY 6 HANDOUT ?INCREASE PHYSICAL ACTIVITY AS TOLERATED ? ? ?Important Information About Sugar ? ? ? ? ?  ?

## 2022-04-23 DIAGNOSIS — N2581 Secondary hyperparathyroidism of renal origin: Secondary | ICD-10-CM | POA: Diagnosis not present

## 2022-04-23 DIAGNOSIS — E875 Hyperkalemia: Secondary | ICD-10-CM | POA: Diagnosis not present

## 2022-04-23 DIAGNOSIS — E872 Acidosis, unspecified: Secondary | ICD-10-CM | POA: Diagnosis not present

## 2022-04-23 DIAGNOSIS — I129 Hypertensive chronic kidney disease with stage 1 through stage 4 chronic kidney disease, or unspecified chronic kidney disease: Secondary | ICD-10-CM | POA: Diagnosis not present

## 2022-04-23 DIAGNOSIS — R609 Edema, unspecified: Secondary | ICD-10-CM | POA: Diagnosis not present

## 2022-04-23 DIAGNOSIS — N184 Chronic kidney disease, stage 4 (severe): Secondary | ICD-10-CM | POA: Diagnosis not present

## 2022-04-23 DIAGNOSIS — I5030 Unspecified diastolic (congestive) heart failure: Secondary | ICD-10-CM | POA: Diagnosis not present

## 2022-04-23 DIAGNOSIS — D631 Anemia in chronic kidney disease: Secondary | ICD-10-CM | POA: Diagnosis not present

## 2022-05-02 ENCOUNTER — Encounter: Payer: Self-pay | Admitting: Internal Medicine

## 2022-05-06 DIAGNOSIS — N1832 Chronic kidney disease, stage 3b: Secondary | ICD-10-CM | POA: Diagnosis not present

## 2022-05-06 DIAGNOSIS — D631 Anemia in chronic kidney disease: Secondary | ICD-10-CM | POA: Diagnosis not present

## 2022-05-07 ENCOUNTER — Telehealth: Payer: Self-pay | Admitting: Internal Medicine

## 2022-05-07 MED ORDER — TORSEMIDE 40 MG PO TABS: 20.0000 mg | ORAL_TABLET | Freq: Every day | ORAL | 1 refills | Status: DC

## 2022-05-07 NOTE — Telephone Encounter (Signed)
Rx sent 

## 2022-05-07 NOTE — Telephone Encounter (Signed)
Medication: Torsemide 40 MG TABS   Has the patient contacted their pharmacy? Yes.    Preferred Pharmacy:  [id number] [address] [address] [address] Phone:  [phone] Fax:  [phone]

## 2022-05-08 ENCOUNTER — Encounter: Payer: Self-pay | Admitting: Internal Medicine

## 2022-05-20 DIAGNOSIS — D631 Anemia in chronic kidney disease: Secondary | ICD-10-CM | POA: Diagnosis not present

## 2022-05-20 DIAGNOSIS — N1832 Chronic kidney disease, stage 3b: Secondary | ICD-10-CM | POA: Diagnosis not present

## 2022-05-24 ENCOUNTER — Telehealth: Payer: Self-pay | Admitting: Cardiology

## 2022-05-24 NOTE — Telephone Encounter (Signed)
Patient reports that over the past 2-3 weeks, his ankles have swelled. Over the past week, he has taken an extra 40 mg of torsemide at lunchtime. He does void more, but the ankle edema has not gone down; not gotten worse. He has no change in his usual sob. BP 160/70, P 59. He does try to avoid salt/sodium, he wears support hose most of the time. He need a new battery in his scale. Edema gets better with leg elevation. Please advise.

## 2022-05-24 NOTE — Telephone Encounter (Signed)
Pt c/o swelling: STAT is pt has developed SOB within 24 hours  If swelling, where is the swelling located? Patient is having swelling in both legs.   How much weight have you gained and in what time span? 15lbs in about a 1 1/2 months  Have you gained 3 pounds in a day or 5 pounds in a week? He might have gained 5lbs this week patient states.   Do you have a log of your daily weights (if so, list)? no  Are you currently taking a fluid pill? yes  Are you currently SOB? Yes, every since his hospital stay he has been SOB.   Have you traveled recently? no

## 2022-05-27 NOTE — Telephone Encounter (Signed)
Note and labs from [place] [facility] are in media in the patients chart

## 2022-05-28 ENCOUNTER — Ambulatory Visit: Payer: Medicare Other

## 2022-06-03 DIAGNOSIS — N1832 Chronic kidney disease, stage 3b: Secondary | ICD-10-CM | POA: Diagnosis not present

## 2022-06-03 DIAGNOSIS — D631 Anemia in chronic kidney disease: Secondary | ICD-10-CM | POA: Diagnosis not present

## 2022-06-04 ENCOUNTER — Encounter: Payer: Self-pay | Admitting: Internal Medicine

## 2022-06-04 ENCOUNTER — Ambulatory Visit (INDEPENDENT_AMBULATORY_CARE_PROVIDER_SITE_OTHER): Payer: Medicare HMO | Admitting: Internal Medicine

## 2022-06-04 VITALS — BP 136/62 | HR 61 | Temp 97.9°F | Resp 16 | Ht 70.0 in | Wt 178.1 lb

## 2022-06-04 DIAGNOSIS — I10 Essential (primary) hypertension: Secondary | ICD-10-CM | POA: Diagnosis not present

## 2022-06-04 DIAGNOSIS — Z794 Long term (current) use of insulin: Secondary | ICD-10-CM

## 2022-06-04 DIAGNOSIS — Z23 Encounter for immunization: Secondary | ICD-10-CM

## 2022-06-04 DIAGNOSIS — E1151 Type 2 diabetes mellitus with diabetic peripheral angiopathy without gangrene: Secondary | ICD-10-CM | POA: Diagnosis not present

## 2022-06-04 DIAGNOSIS — Q8901 Asplenia (congenital): Secondary | ICD-10-CM | POA: Diagnosis not present

## 2022-06-04 DIAGNOSIS — N184 Chronic kidney disease, stage 4 (severe): Secondary | ICD-10-CM | POA: Diagnosis not present

## 2022-06-04 LAB — BASIC METABOLIC PANEL
BUN: 28 mg/dL — ABNORMAL HIGH (ref 6–23)
CO2: 24 mEq/L (ref 19–32)
Calcium: 8.5 mg/dL (ref 8.4–10.5)
Chloride: 102 mEq/L (ref 96–112)
Creatinine, Ser: 2.8 mg/dL — ABNORMAL HIGH (ref 0.40–1.50)
GFR: 19.9 mL/min — ABNORMAL LOW (ref >60.00)
Glucose, Bld: 131 mg/dL — ABNORMAL HIGH (ref 70–99)
Potassium: 3.9 mEq/L (ref 3.5–5.1)
Sodium: 137 mEq/L (ref 135–145)

## 2022-06-04 MED ORDER — SHINGRIX 50 MCG/0.5ML IM SUSR: 0.5000 mL | Freq: Once | INTRAMUSCULAR | 1 refills | Status: AC

## 2022-06-04 MED ORDER — TORSEMIDE 40 MG PO TABS: 40.0000 mg | ORAL_TABLET | Freq: Two times a day (BID) | ORAL | Status: DC

## 2022-06-04 NOTE — Patient Instructions (Addendum)
Per our records you are due for your diabetic eye exam. Please contact your eye doctor to schedule an appointment. Please have them send copies of your office visit notes to [place]. Our fax number is [phone]. If you need a referral to an eye doctor please let us know.   Recommend to proceed with the following vaccines:  Shingrix (shingles)- see printed prescription Covid booster (bivalent)  Continue checking your blood pressures BP GOAL is between 110/65 and  135/85. If it is consistently higher or lower, let me know      GO TO THE LAB : Get the blood work     [place], [place] back for   a checkup in 3 to 4 months

## 2022-06-04 NOTE — Progress Notes (Unsigned)
Subjective:    Patient ID: Ethan Mcneil, male    DOB: Jul 05, 1934, 88 y.o.   MRN: [id number]  DOS:  06/04/2022 Type of visit - description: Follow-up, here with his son  Since the last office visit is doing well. Today we talk about hypertension, diuretics, diabetes. Medication list was reconciliated I checked the cardiology note.  History of recent pneumonia, reports no fever chills.  No cough   Review of Systems See above   Past Medical History:  Diagnosis Date   Anemia    Arthritis    CAD (coronary artery disease)    CABG 1997; grafts patent @ cath [facility] 11/09   DM2 (diabetes mellitus, type 2) ([place])    Gout    after tick bite   History of kidney stones    HLD (hyperlipidemia)    HTN (hypertension)    essential nos   Hypothyroid    Pancreatic lesion    Postsurgical aortocoronary bypass status    PVD (peripheral vascular disease) ([place])    S/P herniorrhaphy    Tick fever 2012   [facility] , [place]   Unspecified hyperplasia of prostate without urinary obstruction and other lower urinary tract symptoms (LUTS)    Urolithiasis 2015   hx of   Wears dentures    Wears glasses     Past Surgical History:  Procedure Laterality Date   BIOPSY  08/07/2020   Procedure: BIOPSY;  Surgeon: Irving Copas., MD;  Location: [place];  Service: Gastroenterology;;   CATARACT EXTRACTION Bilateral    CORONARY ARTERY BYPASS GRAFT     x7 vessels 1997; cath 2002 and 2009; grafts patent    CYSTOSCOPY WITH RETROGRADE PYELOGRAM, URETEROSCOPY AND STENT PLACEMENT Right 02/21/2014   Procedure: CYSTOSCOPY WITH RIGHT  RETROGRADE PYELOGRAM,RIGHT  URETEROSCOPY WITH STONE BASKETING EXTRACTION;  Surgeon: Irine Seal, MD;  [facility];  Service: Urology;  Laterality: Right;   ESOPHAGOGASTRODUODENOSCOPY (EGD) WITH PROPOFOL N/A 08/07/2020   Procedure: ESOPHAGOGASTRODUODENOSCOPY (EGD) WITH PROPOFOL;  Surgeon: Rush Landmark Telford Nab., MD;  Location: [place];  Service:  Gastroenterology;  Laterality: N/A;   EUS N/A 08/07/2020   Procedure: UPPER ENDOSCOPIC ULTRASOUND (EUS) LINEAR;  Surgeon: Irving Copas., MD;  Location: [place];  Service: Gastroenterology;  Laterality: N/A;   FINE NEEDLE ASPIRATION N/A 08/07/2020   Procedure: FINE NEEDLE ASPIRATION (FNA) LINEAR;  Surgeon: Irving Copas., MD;  Location: [place];  Service: Gastroenterology;  Laterality: N/A;   HERNIA REPAIR     INGUINAL HERNIA REPAIR     MULTIPLE TOOTH EXTRACTIONS     SHOULDER SURGERY     SPLENECTOMY, TOTAL N/A 10/26/2020   Procedure: SPLENECTOMY;  Surgeon: Stark Klein, MD;  Location: [place];  Service: General;  Laterality: N/A;    Current Outpatient Medications  Medication Instructions   acetaminophen (TYLENOL) 500 mg, Oral, Every 4 hours PRN   albuterol (VENTOLIN HFA) 108 (90 Base) MCG/ACT inhaler 2 puffs, Inhalation, Every 6 hours PRN   amLODipine (NORVASC) 10 mg, Oral, Daily   aspirin EC 81 mg, Oral, Daily at bedtime   B-12 1,000 mcg, Oral, Daily   B-D UF III MINI PEN NEEDLES 31G X 5 MM MISC USE 3 TIMES A DAY AS DIRECTED   Basaglar KwikPen 6 Units, Subcutaneous, Daily   Continuous Blood Gluc Receiver (FREESTYLE LIBRE 2 READER) DEVI Use as instructed to check blood sugar   Continuous Blood Gluc Sensor (FREESTYLE LIBRE 14 DAY SENSOR) MISC USE 1 SENSOR EVERY 14 (FOURTEEN) DAYS.  FREESTYLE LITE test strip CHECK BLOOD SUGAR NO MORE THAN TWICE DAILY.   hydrALAZINE (APRESOLINE) 100 mg, Oral, 3 times daily   insulin aspart (NOVOLOG FLEXPEN) 100 UNIT/ML FlexPen 3 times a day (just before each meal), 5-6-6 units.   isosorbide mononitrate (IMDUR) 60 mg, Oral, Daily   levothyroxine (SYNTHROID) 150 MCG tablet TAKE 1 TABLET BY MOUTH DAILY BEFORE BREAKFAST.   metoprolol succinate (TOPROL-XL) 25 mg, Oral, Daily   nitroGLYCERIN (NITROSTAT) 0.4 mg, Sublingual, Every 5 min x3 PRN   omeprazole (PRILOSEC) 40 mg, Oral, Daily   Pancrelipase, Lip-Prot-Amyl, (ZENPEP)  25000-79000 units CPEP Take 2 capsules by mouth with every meal and snacks   simvastatin (ZOCOR) 20 MG tablet TAKE 1 TABLET BY MOUTH EVERYDAY AT BEDTIME   sodium bicarbonate 650 mg, Oral, 2 times daily   Torsemide 20 mg, Oral, Daily   Vitamin D3 2,000 Units, Oral, Daily   Zoster Vaccine Adjuvanted (SHINGRIX) injection 0.5 mLs, Intramuscular,  Once       Objective:   Physical Exam BP 136/62   Pulse 61   Temp 97.9 F (36.6 C) (Oral)   Resp 16   Ht '5\' 10"'$  (1.778 m)   Wt 178 lb 2 oz (80.8 kg)   SpO2 91%   BMI 25.56 kg/m  General:   Well developed, NAD, BMI noted. HEENT:  Normocephalic . Face symmetric, atraumatic Lungs:  CTA B Normal respiratory effort, no intercostal retractions, no accessory muscle use. Heart: RRR,  no murmur.  Lower extremities: Lower extremities are very large, he has compression stockings, edema is trace-pitting, at baseline. Skin: Not pale. Not jaundice Neurologic:  alert & oriented X3.  Speech normal, gait appropriate for age and unassisted Psych--  Cognition and judgment appear intact.  Cooperative with normal attention span and concentration.  Behavior appropriate. No anxious or depressed appearing.      Assessment   Assessment DM -- w/ CAD, no neuropathy, intolerant to metformin , sees ENDO HTN Hyperkalemia-- off  ACEs Hyperlipidemia Hypothyroidism DJD BPH, LUTS,h/o urolithiasis -- sees urology CKD: started to see renal 06/2020 Anemia  CV: --CAD, CABG 1997, cardiac cath 2009 --PVD (no ABIs in the chart) --L leg edema, chronic, (-) DVT per [place] 2015 -- CHF Gout GI: Pancreatic insufficiency dx 02-2017 B 12 deficiency, mild 2015  Wt loss:  2012 187 lb, 2014 173 lb , 2015 167 01-2017: EGD normal, BX chronic active gastritis. Colonoscopy normal. Saw GI 02-2017: likely d/t Pancreatic insufficiency and poorly controlled diabetes. 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  Surgeries 10-2020 Asplenia   PLAN: DM: Last A1c 8.0, was rec  to reach out to Endo but he did not do so, has no appointment scheduled. Currently on Basaglar 7 units, NovoLog 6 units 3 times daily.  Ambulatory BPs in the morning typically in the 130s.  Referral to Endo sent. HTN: Seems to be well controlled, BPs in the 130s when checked.  Currently on hydralazine, metoprolol, amlodipine.  See LOV, torsemide dose decreased to 40 mg  1/2 tab a day due to renal function, he however self increase it to 40 mg BID due to edema. Advised patient edema is chronic, not likely to respond to higher doses of torsemide.  Plan to check a BMP and consider decreased torsemide. Addendum: Creatinine came back is still elevated, was recommended to decrease torsemide to 40 mg daily. CKD:  See above. CAD, CHF Saw cardiology 04/22/2022, acute on chronic diastolic CHF was felt to be stable.  No change suggested. Hyperlipidemia: Well-controlled per last FLP Asplenia: Immunizations reviewed - PNM 23 : 2011; PNM 13: 11-2015.  PNM 20: 09-2021 -Meningococcal: #1 today 11-02-21, #2 02/2022. Boost q 5 years -meningococcal B: 02/2022 and today 06-04-2022 - haemophilus influenza:   11-02-21. Completed  Pneumonia h/o: see LOV, now asx. Preventive care: Shingrix and COVID recommended. RTC in 3 to 4 months

## 2022-06-05 NOTE — Assessment & Plan Note (Signed)
DM: Last A1c 8.0, was rec  to reach out to Endo but he did not do so, has no appointment scheduled. Currently on Basaglar 7 units, NovoLog 6 units 3 times daily.  Ambulatory BPs in the morning typically in the 130s.  Referral to Endo sent. HTN: Seems to be well controlled, BPs in the 130s when checked.  Currently on hydralazine, metoprolol, amlodipine.  See LOV, torsemide dose decreased to 40 mg  1/2 tab a day due to renal function, he however self increase it to 40 mg BID due to edema. Advised patient edema is chronic, not likely to respond to higher doses of torsemide.  Plan to check a BMP and consider decreased torsemide. Addendum: Creatinine came back is still elevated, was recommended to decrease torsemide to 40 mg daily. CKD:   See above. CAD, CHF Saw cardiology 04/22/2022, acute on chronic diastolic CHF was felt to be stable.  No change suggested. Hyperlipidemia: Well-controlled per last FLP Asplenia: Immunizations reviewed - PNM 23 : 2011; PNM 13: 11-2015.  PNM 20: 09-2021 -Meningococcal: #1 today 11-02-21, #2 02/2022. Boost q 5 years -meningococcal B: 02/2022 and today 06-04-2022 - haemophilus influenza:   11-02-21. Completed  Pneumonia h/o: see LOV, now asx. Preventive care: Shingrix and COVID recommended. RTC in 3 to 4 months

## 2022-06-07 ENCOUNTER — Other Ambulatory Visit: Payer: Self-pay | Admitting: Internal Medicine

## 2022-06-13 DIAGNOSIS — E785 Hyperlipidemia, unspecified: Secondary | ICD-10-CM | POA: Insufficient documentation

## 2022-06-13 NOTE — Progress Notes (Signed)
Cardiology Office Note   Date:  06/14/2022   ID:  Ethan Mcneil, Ethan Mcneil [birth year], MRN [id number]  PCP:  Ethan Branch, MD  Cardiologist:   Ethan Breeding, MD   Chief Complaint  Patient presents with   Edema      History of Present Illness: Ethan Mcneil is a 88 y.o. male who presents for evaluation of heart failure.  He has a history of CAD with CABG in 1997 and a prior cardiac cath in 2009 that showed patent grafts.  Has chronic kidney disease as well.  Has a history of intraductal papillary mucinous neoplasm of the pancreas resected.  He has had chronic diastolic HF.    He was recently hospitalized with CAP in late April.   He called recently with some increased leg swelling.  There was a question whether he could take some increased diuretic because of his renal insufficiency I wanted to hold off on doing that until seeing him.  I did get blood work from nephrology.  His creatinine was most recently 2.8 which is stable compared to previous.  He said he is just going to put up with the lower extremity swelling.  He wears his compression stockings.  When he wakes up in the morning his feet are less swollen.  He is not having any new shortness of breath, PND or orthopnea.  He had no new palpitations, presyncope or syncope.  Compared to what I think was his dry weight after 1 admission for heart failure in the past his weight is up about 15 to 20 pounds.  However, he thinks this is happened slowly with food.  He is not having any new chest pressure, neck or arm discomfort.      Past Medical History:  Diagnosis Date   Anemia    Arthritis    CAD (coronary artery disease)    CABG 1997; grafts patent @ cath [facility] 11/09   DM2 (diabetes mellitus, type 2) ([place])    Gout    after tick bite   History of kidney stones    HLD (hyperlipidemia)    HTN (hypertension)    essential nos   Hypothyroid    Pancreatic lesion    Postsurgical aortocoronary bypass status    PVD (peripheral  vascular disease) ([place])    S/P herniorrhaphy    Tick fever 2012   [facility] , [place]   Unspecified hyperplasia of prostate without urinary obstruction and other lower urinary tract symptoms (LUTS)    Urolithiasis 2015   hx of   Wears dentures    Wears glasses     Past Surgical History:  Procedure Laterality Date   BIOPSY  08/07/2020   Procedure: BIOPSY;  Surgeon: Irving Copas., MD;  Location: [place];  Service: Gastroenterology;;   CATARACT EXTRACTION Bilateral    CORONARY ARTERY BYPASS GRAFT     x7 vessels 1997; cath 2002 and 2009; grafts patent    CYSTOSCOPY WITH RETROGRADE PYELOGRAM, URETEROSCOPY AND STENT PLACEMENT Right 02/21/2014   Procedure: CYSTOSCOPY WITH RIGHT  RETROGRADE PYELOGRAM,RIGHT  URETEROSCOPY WITH STONE BASKETING EXTRACTION;  Surgeon: Irine Seal, MD;  [facility];  Service: Urology;  Laterality: Right;   ESOPHAGOGASTRODUODENOSCOPY (EGD) WITH PROPOFOL N/A 08/07/2020   Procedure: ESOPHAGOGASTRODUODENOSCOPY (EGD) WITH PROPOFOL;  Surgeon: Rush Landmark Telford Nab., MD;  Location: [place];  Service: Gastroenterology;  Laterality: N/A;   EUS N/A 08/07/2020   Procedure: UPPER ENDOSCOPIC ULTRASOUND (EUS) LINEAR;  Surgeon: Irving Copas., MD;  [facility];  Service: Gastroenterology;  Laterality: N/A;   FINE NEEDLE ASPIRATION N/A 08/07/2020   Procedure: FINE NEEDLE ASPIRATION (FNA) LINEAR;  Surgeon: Irving Copas., MD;  Location: [place];  Service: Gastroenterology;  Laterality: N/A;   HERNIA REPAIR     INGUINAL HERNIA REPAIR     MULTIPLE TOOTH EXTRACTIONS     SHOULDER SURGERY     SPLENECTOMY, TOTAL N/A 10/26/2020   Procedure: SPLENECTOMY;  Surgeon: Stark Klein, MD;  Location: [place];  Service: General;  Laterality: N/A;     Current Outpatient Medications  Medication Sig Dispense Refill   acetaminophen (TYLENOL) 500 MG tablet Take 500 mg by mouth every 4 (four) hours as needed for moderate pain.      amLODipine (NORVASC) 10 MG tablet Take 1 tablet (10 mg total) by mouth daily. 30 tablet 0   aspirin EC 81 MG tablet Take 81 mg by mouth at bedtime.     B-D UF III MINI PEN NEEDLES 31G X 5 MM MISC USE 3 TIMES A DAY AS DIRECTED 100 each 2   Cholecalciferol (VITAMIN D3) 50 MCG (2000 UT) capsule Take 2,000 Units by mouth daily.     Continuous Blood Gluc Receiver (FREESTYLE LIBRE 2 READER) DEVI Use as instructed to check blood sugar 1 each 0   Cyanocobalamin (B-12) 1000 MCG TABS Take 1,000 mcg by mouth daily.     FREESTYLE LITE test strip CHECK BLOOD SUGAR NO MORE THAN TWICE DAILY. 200 strip 0   hydrALAZINE (APRESOLINE) 100 MG tablet Take 100 mg by mouth 3 (three) times daily.     insulin aspart (NOVOLOG FLEXPEN) 100 UNIT/ML FlexPen 3 times a day (just before each meal), 5-6-6 units. (Patient taking differently: 6 Units 3 (three) times daily with meals.) 15 mL 11   Insulin Glargine (BASAGLAR KWIKPEN) 100 UNIT/ML Inject 6 Units into the skin daily. (Patient taking differently: Inject 7 Units into the skin daily.) 15 mL 0   isosorbide mononitrate (IMDUR) 60 MG 24 hr tablet Take 1 tablet (60 mg total) by mouth daily. 90 tablet 3   levothyroxine (SYNTHROID) 150 MCG tablet TAKE 1 TABLET BY MOUTH DAILY BEFORE BREAKFAST. (Patient taking differently: Take 150 mcg by mouth daily before breakfast.) 90 tablet 1   metoprolol succinate (TOPROL-XL) 25 MG 24 hr tablet Take 1 tablet (25 mg total) by mouth daily. 30 tablet 0   nitroGLYCERIN (NITROSTAT) 0.4 MG SL tablet Place 1 tablet (0.4 mg total) under the tongue every 5 (five) minutes x 3 doses as needed for chest pain. 25 tablet 2   omeprazole (PRILOSEC) 40 MG capsule Take 1 capsule (40 mg total) by mouth daily. 90 capsule 2   Pancrelipase, Lip-Prot-Amyl, (ZENPEP) 25000-79000 units CPEP Take 2 capsules by mouth with every meal and snacks (Patient taking differently: Take 1 capsule by mouth daily at 12 noon.) 540 capsule 1   simvastatin (ZOCOR) 20 MG tablet TAKE 1  TABLET BY MOUTH EVERYDAY AT BEDTIME 90 tablet 1   sodium bicarbonate 650 MG tablet Take 650 mg by mouth 2 (two) times daily.     Torsemide 40 MG TABS Take 40 mg by mouth daily.     albuterol (VENTOLIN HFA) 108 (90 Base) MCG/ACT inhaler Inhale 2 puffs into the lungs every 6 (six) hours as needed for wheezing or shortness of breath. (Patient not taking: Reported on 06/14/2022) 8 g 2   Continuous Blood Gluc Sensor (FREESTYLE LIBRE 14 DAY SENSOR) MISC USE 1 SENSOR EVERY 14 (FOURTEEN) DAYS. (Patient  not taking: Reported on 06/14/2022) 6 each 3   No current facility-administered medications for this visit.    Allergies:   Ciprofloxacin and Codeine   ROS:  Please see the history of present illness.   Otherwise, review of systems are positive for .   All other systems are reviewed and negative.    PHYSICAL EXAM: VS:  BP (!) 152/60   Pulse 62   Ht '5\' 10"'$  (1.778 m)   Wt 178 lb 9.6 oz (81 kg)   SpO2 94%   BMI 25.63 kg/m  , BMI Body mass index is 25.63 kg/m. GENERAL: Slightly frail appearing NECK:  No jugular venous distention, waveform within normal limits, carotid upstroke brisk and symmetric, no bruits, no thyromegaly LUNGS:  Clear to auscultation bilaterally CHEST:  Unremarkable HEART:  PMI not displaced or sustained,S1 and S2 within normal limits, no S3, no S4, no clicks, no rubs, 3 out of 6 apical systolic murmur radiating slightly at aortic outflow tract, no diastolic murmurs ABD:  Flat, positive bowel sounds normal in frequency in pitch, no bruits, no rebound, no guarding, no midline pulsatile mass, no hepatomegaly, no splenomegaly EXT:  2 plus pulses throughout, moderate bilateral edema extending to the knees edema, no cyanosis no clubbing   EKG:  EKG is not ordered today.   Recent Labs: 04/04/2022: B Natriuretic Peptide 710.8 04/05/2022: TSH 1.785 04/17/2022: Hemoglobin 11.0; Platelets 335 04/22/2022: ALT 36 06/04/2022: BUN 28; Creatinine, Ser 2.80; Potassium 3.9; Sodium 137    Lipid  Panel    Component Value Date/Time   CHOL 116 04/22/2022 0907   TRIG 69 04/22/2022 0907   HDL 57 04/22/2022 0907   CHOLHDL 2.0 04/22/2022 0907   CHOLHDL 2 09/25/2021 0901   VLDL 17.4 09/25/2021 0901   LDLCALC 45 04/22/2022 0907      Wt Readings from Last 3 Encounters:  06/14/22 178 lb 9.6 oz (81 kg)  06/04/22 178 lb 2 oz (80.8 kg)  04/22/22 161 lb 3.2 oz (73.1 kg)      Other studies Reviewed: Additional studies/ records that were reviewed today include: Hospital records from recent admission  Review of the above records demonstrates:  Please see elsewhere in the note.     ASSESSMENT AND PLAN:  Chronic diastolic CHF:   He does have some increased edema compared to previous.  However, he certainly has renal insufficiency.  I am going to just give him an additional 3 days of Demadex 20 mg and he is going to keep his feet elevated.  Otherwise no change in therapy.  CAD s/p CABG 1997:   He is having no ongoing symptoms.  He will continue with risk reduction.  HTN: BP is elevated.  However, he is not always in this range and he is on max dose therapies and would prefer not to take other medications.  He has contraindications to ACE inhibitors and beta-blockers increased dose.  He will continue the meds as listed.    HLD  LDL  was excellent.  No change in therapy.   DM II:   A1c was up to 8.0.  His endocrinologist retired and he is hoping to be reassigned to a new physician.  CKD stage IV:   Creat was 2.8 which is slightly down from previous.  He follows with nephrology and I reviewed their notes today.   Current medicines are reviewed at length with the patient today.  The patient does not have concerns regarding medicines.  The following changes have been made:  As above  Labs/ tests ordered today include: NA   No orders of the defined types were placed in this encounter.    Disposition:   FU with 6 months.    Signed, Ethan Breeding, MD  06/14/2022 8:40 AM    [place]

## 2022-06-14 ENCOUNTER — Ambulatory Visit: Payer: Medicare HMO | Admitting: Cardiology

## 2022-06-14 ENCOUNTER — Encounter: Payer: Self-pay | Admitting: Cardiology

## 2022-06-14 VITALS — BP 152/60 | HR 62 | Ht 70.0 in | Wt 178.6 lb

## 2022-06-14 DIAGNOSIS — I10 Essential (primary) hypertension: Secondary | ICD-10-CM | POA: Diagnosis not present

## 2022-06-14 DIAGNOSIS — E785 Hyperlipidemia, unspecified: Secondary | ICD-10-CM | POA: Diagnosis not present

## 2022-06-14 DIAGNOSIS — N184 Chronic kidney disease, stage 4 (severe): Secondary | ICD-10-CM

## 2022-06-14 DIAGNOSIS — I5032 Chronic diastolic (congestive) heart failure: Secondary | ICD-10-CM | POA: Diagnosis not present

## 2022-06-14 DIAGNOSIS — I2510 Atherosclerotic heart disease of native coronary artery without angina pectoris: Secondary | ICD-10-CM

## 2022-06-14 DIAGNOSIS — I129 Hypertensive chronic kidney disease with stage 1 through stage 4 chronic kidney disease, or unspecified chronic kidney disease: Secondary | ICD-10-CM | POA: Diagnosis not present

## 2022-06-14 NOTE — Patient Instructions (Signed)
Medication Instructions:  Your physician has recommended you make the following change in your medication:  Please take an extra 20 mg (half a pill) of Torsemide for 3 days then go back to regular dose (40 mg daily). *If you need a refill on your cardiac medications before your next appointment, please call your pharmacy*   Lab Work: None If you have labs (blood work) drawn today and your tests are completely normal, you will receive your results only by: [place] (if you have MyChart) OR A paper copy in the mail If you have any lab test that is abnormal or we need to change your treatment, we will call you to review the results.   Testing/Procedures: None   Follow-Up: At [facility], you and your health needs are our priority.  As part of our continuing mission to provide you with exceptional heart care, we have created designated Provider Care Teams.  These Care Teams include your primary Cardiologist (physician) and Advanced Practice Providers (APPs -  Physician Assistants and Nurse Practitioners) who all work together to provide you with the care you need, when you need it.  We recommend signing up for the patient portal called "MyChart".  Sign up information is provided on this After Visit Summary.  MyChart is used to connect with patients for Virtual Visits (Telemedicine).  Patients are able to view lab/test results, encounter notes, upcoming appointments, etc.  Non-urgent messages can be sent to your provider as well.   To learn more about what you can do with MyChart, go to [email or web address].    Your next appointment:   6 month(s)  The format for your next appointment:   In Person  Provider:   Minus Breeding, MD     Other Instructions   Important Information About Sugar

## 2022-06-17 ENCOUNTER — Ambulatory Visit: Payer: Medicare HMO | Admitting: Internal Medicine

## 2022-06-17 DIAGNOSIS — N1832 Chronic kidney disease, stage 3b: Secondary | ICD-10-CM | POA: Diagnosis not present

## 2022-06-17 DIAGNOSIS — D631 Anemia in chronic kidney disease: Secondary | ICD-10-CM | POA: Diagnosis not present

## 2022-06-24 ENCOUNTER — Ambulatory Visit: Payer: Medicare HMO | Admitting: Cardiology

## 2022-06-25 ENCOUNTER — Other Ambulatory Visit: Payer: Self-pay

## 2022-06-25 MED ORDER — TORSEMIDE 40 MG PO TABS: 40.0000 mg | ORAL_TABLET | Freq: Every day | ORAL | 0 refills | Status: DC

## 2022-07-01 DIAGNOSIS — D631 Anemia in chronic kidney disease: Secondary | ICD-10-CM | POA: Diagnosis not present

## 2022-07-01 DIAGNOSIS — N184 Chronic kidney disease, stage 4 (severe): Secondary | ICD-10-CM | POA: Diagnosis not present

## 2022-07-15 DIAGNOSIS — N184 Chronic kidney disease, stage 4 (severe): Secondary | ICD-10-CM | POA: Diagnosis not present

## 2022-07-15 DIAGNOSIS — D631 Anemia in chronic kidney disease: Secondary | ICD-10-CM | POA: Diagnosis not present

## 2022-07-16 ENCOUNTER — Ambulatory Visit (INDEPENDENT_AMBULATORY_CARE_PROVIDER_SITE_OTHER): Payer: Medicare HMO

## 2022-07-16 VITALS — Ht 70.0 in | Wt 178.0 lb

## 2022-07-16 DIAGNOSIS — Z0000 Encounter for general adult medical examination without abnormal findings: Secondary | ICD-10-CM

## 2022-07-16 NOTE — Patient Instructions (Addendum)
  Ethan Mcneil , Thank you for taking time to come for your Medicare Wellness Visit. I appreciate your ongoing commitment to your health goals. Please review the following plan we discussed and let me know if I can assist you in the future.   These are the goals we discussed:  Goals      Maintain current health and good blood sugar levels     Maintain physical strength        This is a list of the screening recommended for you and due dates:  Health Maintenance  Topic Date Due   Flu Shot  07/16/2022   COVID-19 Vaccine (4 - Booster for Moderna series) 08/01/2022*   Zoster (Shingles) Vaccine (1 of 2) 10/16/2022*   Complete foot exam   08/21/2022   Hemoglobin A1C  10/16/2022   Eye exam for diabetics  04/30/2023   Tetanus Vaccine  03/07/2024   Pneumonia Vaccine  Completed   HPV Vaccine  Aged Out   Meningococcal B Vaccine  Discontinued  *Topic was postponed. The date shown is not the original due date.

## 2022-07-16 NOTE — Progress Notes (Addendum)
Subjective:   Ethan Mcneil is a 88 y.o. male who presents for Medicare Annual/Subsequent preventive examination.  Review of Systems    No ROS.  Medicare Wellness Virtual Visit.  Visual/audio telehealth visit, UTA vital signs.   See social history for additional risk factors.   Cardiac Risk Factors include: advanced age (>53mn, >>45women);male gender     Objective:    Today's Vitals   07/16/22 1400  Weight: 178 lb (80.7 kg)  Height: '5\' 10"'$  (1.778 m)   Body mass index is 25.54 kg/m.     07/16/2022    2:04 PM 04/04/2022    7:00 PM 12/28/2021    3:00 PM 05/24/2021    9:01 AM 10/27/2020    7:55 PM 10/23/2020   10:23 AM 08/07/2020    7:47 AM  Advanced Directives  Does Patient Have a Medical Advance Directive? Yes Yes No Yes Yes Yes Yes  Type of AParamedicof [place] will Living will  [place] will Living will Living will [place] will  Does patient want to make changes to medical advance directive? No - Patient declined No - Patient declined   No - Patient declined No - Patient declined   Copy of [place] Chart? Yes - validated most recent copy scanned in chart (See row information)   Yes - validated most recent copy scanned in chart (See row information)   No - copy requested  Would patient like information on creating a medical advance directive?   No - Patient declined        Current Medications (verified) Outpatient Encounter Medications as of 07/16/2022  Medication Sig   acetaminophen (TYLENOL) 500 MG tablet Take 500 mg by mouth every 4 (four) hours as needed for moderate pain.   albuterol (VENTOLIN HFA) 108 (90 Base) MCG/ACT inhaler Inhale 2 puffs into the lungs every 6 (six) hours as needed for wheezing or shortness of breath. (Patient not taking: Reported on 06/14/2022)   amLODipine (NORVASC) 10 MG tablet Take 1 tablet (10 mg total) by mouth daily.   aspirin EC 81 MG  tablet Take 81 mg by mouth at bedtime.   B-D UF III MINI PEN NEEDLES 31G X 5 MM MISC USE 3 TIMES A DAY AS DIRECTED   Cholecalciferol (VITAMIN D3) 50 MCG (2000 UT) capsule Take 2,000 Units by mouth daily.   Continuous Blood Gluc Receiver (FREESTYLE LIBRE 2 READER) DEVI Use as instructed to check blood sugar   Continuous Blood Gluc Sensor (FREESTYLE LIBRE 14 DAY SENSOR) MISC USE 1 SENSOR EVERY 14 (FOURTEEN) DAYS. (Patient not taking: Reported on 06/14/2022)   Cyanocobalamin (B-12) 1000 MCG TABS Take 1,000 mcg by mouth daily.   FREESTYLE LITE test strip CHECK BLOOD SUGAR NO MORE THAN TWICE DAILY.   hydrALAZINE (APRESOLINE) 100 MG tablet Take 100 mg by mouth 3 (three) times daily.   insulin aspart (NOVOLOG FLEXPEN) 100 UNIT/ML FlexPen 3 times a day (just before each meal), 5-6-6 units. (Patient taking differently: 6 Units 3 (three) times daily with meals.)   Insulin Glargine (BASAGLAR KWIKPEN) 100 UNIT/ML Inject 6 Units into the skin daily. (Patient taking differently: Inject 7 Units into the skin daily.)   isosorbide mononitrate (IMDUR) 60 MG 24 hr tablet Take 1 tablet (60 mg total) by mouth daily.   levothyroxine (SYNTHROID) 150 MCG tablet TAKE 1 TABLET BY MOUTH DAILY BEFORE BREAKFAST. (Patient taking differently: Take 150 mcg by mouth daily before breakfast.)  metoprolol succinate (TOPROL-XL) 25 MG 24 hr tablet Take 1 tablet (25 mg total) by mouth daily.   nitroGLYCERIN (NITROSTAT) 0.4 MG SL tablet Place 1 tablet (0.4 mg total) under the tongue every 5 (five) minutes x 3 doses as needed for chest pain.   omeprazole (PRILOSEC) 40 MG capsule Take 1 capsule (40 mg total) by mouth daily.   Pancrelipase, Lip-Prot-Amyl, (ZENPEP) 25000-79000 units CPEP Take 2 capsules by mouth with every meal and snacks (Patient taking differently: Take 1 capsule by mouth daily at 12 noon.)   simvastatin (ZOCOR) 20 MG tablet TAKE 1 TABLET BY MOUTH EVERYDAY AT BEDTIME   sodium bicarbonate 650 MG tablet Take 650 mg by mouth 2  (two) times daily.   Torsemide 40 MG TABS Take 40 mg by mouth daily.   No facility-administered encounter medications on file as of 07/16/2022.    Allergies (verified) Ciprofloxacin and Codeine   History: Past Medical History:  Diagnosis Date   Anemia    Arthritis    CAD (coronary artery disease)    CABG 1997; grafts patent @ cath [facility] 11/09   DM2 (diabetes mellitus, type 2) ([place])    Gout    after tick bite   History of kidney stones    HLD (hyperlipidemia)    HTN (hypertension)    essential nos   Hypothyroid    Pancreatic lesion    Postsurgical aortocoronary bypass status    PVD (peripheral vascular disease) ([place])    S/P herniorrhaphy    Tick fever 2012   [facility] , [place]   Unspecified hyperplasia of prostate without urinary obstruction and other lower urinary tract symptoms (LUTS)    Urolithiasis 2015   hx of   Wears dentures    Wears glasses    Past Surgical History:  Procedure Laterality Date   BIOPSY  08/07/2020   Procedure: BIOPSY;  Surgeon: Irving Copas., MD;  Location: [place];  Service: Gastroenterology;;   CATARACT EXTRACTION Bilateral    CORONARY ARTERY BYPASS GRAFT     x7 vessels 1997; cath 2002 and 2009; grafts patent    [place], URETEROSCOPY AND STENT PLACEMENT Right 02/21/2014   Procedure: CYSTOSCOPY WITH RIGHT  RETROGRADE PYELOGRAM,RIGHT  URETEROSCOPY WITH STONE BASKETING EXTRACTION;  Surgeon: Irine Seal, MD;  Location: [facility];  Service: Urology;  Laterality: Right;   ESOPHAGOGASTRODUODENOSCOPY (EGD) WITH PROPOFOL N/A 08/07/2020   Procedure: ESOPHAGOGASTRODUODENOSCOPY (EGD) WITH PROPOFOL;  Surgeon: Rush Landmark Telford Nab., MD;  Location: [place];  Service: Gastroenterology;  Laterality: N/A;   EUS N/A 08/07/2020   Procedure: UPPER ENDOSCOPIC ULTRASOUND (EUS) LINEAR;  Surgeon: Irving Copas., MD;  Location: [place];  Service: Gastroenterology;  Laterality: N/A;   FINE NEEDLE  ASPIRATION N/A 08/07/2020   Procedure: FINE NEEDLE ASPIRATION (FNA) LINEAR;  Surgeon: Irving Copas., MD;  Location: [place];  Service: Gastroenterology;  Laterality: N/A;   HERNIA REPAIR     INGUINAL HERNIA REPAIR     MULTIPLE TOOTH EXTRACTIONS     SHOULDER SURGERY     SPLENECTOMY, TOTAL N/A 10/26/2020   Procedure: SPLENECTOMY;  Surgeon: Stark Klein, MD;  Location: [place];  Service: General;  Laterality: N/A;   Family History  Problem Relation Age of Onset   Lung cancer Father    Coronary artery disease Mother        stent   Diabetes Brother    Prostate cancer Maternal Uncle        in his 31s   Stroke  Son        GF   Throat cancer Son    Colon cancer Neg Hx    Rectal cancer Neg Hx    Stomach cancer Neg Hx    Esophageal cancer Neg Hx    Pancreatic cancer Neg Hx    Social History   Socioeconomic History   Marital status: Married    Spouse name: Electrical engineer   Number of children: 4   Years of education: Not on file   Highest education level: Not on file  Occupational History   Occupation: retired, works few hours   Tobacco Use   Smoking status: Never   Smokeless tobacco: Never  Vaping Use   Vaping Use: Never used  Substance and Sexual Activity   Alcohol use: No   Drug use: No   Sexual activity: Not Currently  Other Topics Concern   Not on file  Social History Narrative   Buyer, retail and former race car driver   4 children (boys)   Radio producer form signed appointing wife - [name]; ok to leave msg on home phone [phone]         Social Determinants of Health   Financial Resource Strain: [place]  (07/16/2022)   Overall Financial Resource Strain (CARDIA)    Difficulty of Paying Living Expenses: Not hard at all  Food Insecurity: No Food Insecurity (07/16/2022)   Hunger Vital Sign    Worried About Running Out of Food in the Last Year: Never true    [place] in the Last Year: Never true  Transportation Needs: No Transportation Needs  (07/16/2022)   PRAPARE - Hydrologist (Medical): No    Lack of Transportation (Non-Medical): No  Physical Activity: Insufficiently Active (07/16/2022)   Exercise Vital Sign    Days of Exercise per Week: 5 days    Minutes of Exercise per Session: 20 min  Stress: No Stress Concern Present (07/16/2022)   [place]    Feeling of Stress : Not at all  Social Connections: Moderately Isolated (07/16/2022)   Social Connection and Isolation Panel [NHANES]    Frequency of Communication with Friends and Family: More than three times a week    Frequency of Social Gatherings with Friends and Family: More than three times a week    Attends Religious Services: More than 4 times per year    Active Member of [employer] or Organizations: No    Attends Archivist Meetings: Never    Marital Status: Widowed    Tobacco Counseling Counseling given: Not Answered   Clinical Intake: Pre-visit preparation completed: Yes        Diabetes: Yes (Followed by PCP)  How often do you need to have someone help you when you read instructions, pamphlets, or other written materials from your doctor or pharmacy?: 1 - Never   Interpreter Needed?: No     Activities of Daily Living    07/16/2022    2:02 PM 04/04/2022    7:00 PM  In your present state of health, do you have any difficulty performing the following activities:  Hearing? 1 0  Comment Hearing aids   Vision? 0 0  Difficulty concentrating or making decisions? 0 0  Walking or climbing stairs? 0 0  Dressing or bathing? 0 0  Doing errands, shopping? 0 0  Preparing Food and eating ? N   Using the Toilet? N   In  the past six months, have you accidently leaked urine? N   Do you have problems with loss of bowel control? N   Managing your Medications? N   Managing your Finances? N   Housekeeping or managing your Housekeeping? N    Patient Care Team: Colon Branch, MD as PCP - General (Internal Medicine) Minus Breeding, MD as PCP - Cardiology (Cardiology) Festus Aloe, MD as Consulting Physician (Urology) Josue Hector, MD as Consulting Physician (Cardiology) Irene Shipper, MD as Consulting Physician (Gastroenterology) Phylliss Blakes, OD as Consulting Physician (Optometry) Rosita Fire, MD as Consulting Physician (Nephrology)  Indicate any recent Medical Services you may have received from other than [facility] providers in the past year (date may be approximate).     Assessment:   This is a routine wellness examination for [place].  Virtual Visit via Telephone Note  I connected with  Ethan Mcneil on 07/16/22 at  1:45 PM EDT by telephone and verified that I am speaking with the correct person using two identifiers.  Location: Patient: home Provider: office Persons participating in the virtual visit: patient/Nurse Health Advisor   I discussed the limitations of performing an evaluation and management service by telehealth. We continued and completed visit with audio only. Some vital signs may be absent or patient reported.   Hearing/Vision screen Hearing Screening - Comments:: Has hearing aids but does not always wear them Vision Screening - Comments:: -Followed by Dr. Darcel Smalling  Wears reading glasses  Dietary issues and exercise activities discussed: Current Exercise Habits: Home exercise routine, Intensity: Mild   Goals Addressed             This Visit's Progress    Maintain physical strength   On track      Depression Screen    07/16/2022    2:04 PM 06/04/2022    8:36 AM 09/25/2021    8:21 AM 06/25/2021    1:50 PM 05/24/2021    9:02 AM 01/04/2021    7:56 AM 05/22/2020    8:58 AM  PHQ 2/9 Scores  PHQ - 2 Score 0 0 0 0 0 0 0    Fall Risk    07/16/2022    2:02 PM 06/04/2022    8:36 AM 09/25/2021    8:21 AM 06/25/2021    1:50 PM 05/24/2021    9:02 AM  [place] in the past year? 0 0 0 0 0  Number falls  in past yr: 0 0 0 0 0  Injury with Fall? 0 0 0 0 0  Follow up Falls evaluation completed Falls evaluation completed Falls evaluation completed Falls evaluation completed Falls prevention discussed   FALL RISK PREVENTION PERTAINING TO THE HOME: Home free of loose throw rugs in walkways, pet beds, electrical cords, etc? Yes  Adequate lighting in your home to reduce risk of falls? Yes   ASSISTIVE DEVICES UTILIZED TO PREVENT FALLS: Use of a cane, walker or w/c? No   TIMED UP AND GO: Was the test performed? No .   Cognitive Function: Patient is alert and oriented x3.      05/18/2018    8:25 AM 05/15/2017    8:25 AM  MMSE - Mini Mental State Exam  Orientation to time 5 5  Orientation to Place 5 5  Registration 3 3  Attention/ Calculation 5 4  Recall 3 2  Language- name 2 objects 2 2  Language- repeat 1 0  Language- follow 3 step command  3 3  Language- read & follow direction 1 1  Write a sentence 1 1  Copy design 1 1  Total score 30 27        07/16/2022    2:12 PM 05/24/2021    9:08 AM 05/22/2020    8:58 AM  6CIT Screen  What Year? 0 points 0 points 0 points  What month? 0 points 0 points 0 points  What time? 0 points 0 points 0 points  Count back from 20 0 points 0 points 2 points  Months in reverse 0 points 0 points 0 points  Repeat phrase 0 points 2 points 0 points  Total Score 0 points 2 points 2 points    Immunizations Immunization History  Administered Date(s) Administered   Fluad Quad(high Dose 65+) 09/17/2019, 10/30/2020, 09/25/2021   HiB (PRP-OMP) 11/02/2021   Influenza Split 10/29/2011, 11/04/2012   Influenza Whole 11/03/2007, 10/09/2009   Influenza, High Dose Seasonal PF 11/23/2015, 09/04/2016, 09/16/2017, 11/23/2018   Influenza,inj,Quad PF,6+ Mos 10/10/2014   Meningococcal B, OMV 03/04/2022, 06/04/2022   Meningococcal Mcv4o 11/02/2021, 03/04/2022   Moderna Sars-Covid-2 Vaccination 01/13/2020, 02/10/2020, 12/01/2020   PNEUMOCOCCAL CONJUGATE-20 09/25/2021    Pneumococcal Conjugate-13 11/23/2015   Pneumococcal Polysaccharide-23 05/10/2010   Tdap 03/07/2014   Zoster, Live 06/07/2014   Screening Tests Health Maintenance  Topic Date Due   INFLUENZA VACCINE  07/16/2022   COVID-19 Vaccine (4 - Booster for Moderna series) 08/01/2022 (Originally 01/26/2021)   Zoster Vaccines- Shingrix (1 of 2) 10/16/2022 (Originally 08/02/1953)   FOOT EXAM  08/21/2022   HEMOGLOBIN A1C  10/16/2022   OPHTHALMOLOGY EXAM  04/30/2023   TETANUS/TDAP  03/07/2024   Pneumonia Vaccine 78+ Years old  Completed   HPV VACCINES  Aged Out   Meningococcal B Vaccine  Discontinued   Health Maintenance Health Maintenance Due  Topic Date Due   INFLUENZA VACCINE  07/16/2022   Lung Cancer Screening: (Low Dose CT Chest recommended if Age 44-80 years, 30 pack-year currently smoking OR have quit w/in 15years.) does not qualify.   Hepatitis C Screening: does not qualify.  Vision Screening: Recommended annual ophthalmology exams for early detection of glaucoma and other disorders of the eye.  Dental Screening: Recommended annual dental exams for proper oral hygiene.  Community Resource Referral / Chronic Care Management: CRR required this visit?  No   CCM required this visit?  No      Plan:   Keep all routine maintenance appointments.   I have personally reviewed and noted the following in the patient's chart:   Medical and social history Use of alcohol, tobacco or illicit drugs  Current medications and supplements including opioid prescriptions. Patient is not currently taking opioid prescriptions. Functional ability and status Nutritional status Physical activity Advanced directives List of other physicians Hospitalizations, surgeries, and ER visits in previous 12 months Vitals Screenings to include cognitive, depression, and falls Referrals and appointments  In addition, I have reviewed and discussed with patient certain preventive protocols, quality metrics, and  best practice recommendations. A written personalized care plan for preventive services as well as general preventive health recommendations were provided to patient.     Varney Biles, LPN   05/23/5915   I have reviewed and agree with [facility] documentation.  Kathlene November, MD

## 2022-07-18 DIAGNOSIS — N184 Chronic kidney disease, stage 4 (severe): Secondary | ICD-10-CM | POA: Diagnosis not present

## 2022-07-18 LAB — BASIC METABOLIC PANEL
BUN: 41 — AB (ref 4–21)
CO2: 22 (ref 13–22)
Chloride: 99 (ref 99–108)
Creatinine: 2.7 — AB (ref 0.6–1.3)
Glucose: 140
Potassium: 4 mEq/L (ref 3.5–5.1)
Sodium: 135 — AB (ref 137–147)

## 2022-07-18 LAB — IRON,TIBC AND FERRITIN PANEL
%SAT: 22
Ferritin: 227
Iron: 47
TIBC: 217
UIBC: 170

## 2022-07-18 LAB — CBC AND DIFFERENTIAL
HCT: 34 — AB (ref 41–53)
Hemoglobin: 11.5 — AB (ref 13.5–17.5)
Neutrophils Absolute: 4.4
Platelets: 268 10*3/uL (ref 150–400)
WBC: 8.5

## 2022-07-18 LAB — COMPREHENSIVE METABOLIC PANEL
Albumin: 4 (ref 3.5–5.0)
Calcium: 8.7 (ref 8.7–10.7)
eGFR: 22

## 2022-07-18 LAB — CBC: RBC: 3.78 — AB (ref 3.87–5.11)

## 2022-07-18 LAB — VITAMIN D 25 HYDROXY (VIT D DEFICIENCY, FRACTURES): Vit D, 25-Hydroxy: 37.4

## 2022-07-23 DIAGNOSIS — E875 Hyperkalemia: Secondary | ICD-10-CM | POA: Diagnosis not present

## 2022-07-23 DIAGNOSIS — N184 Chronic kidney disease, stage 4 (severe): Secondary | ICD-10-CM | POA: Diagnosis not present

## 2022-07-23 DIAGNOSIS — N2581 Secondary hyperparathyroidism of renal origin: Secondary | ICD-10-CM | POA: Diagnosis not present

## 2022-07-23 DIAGNOSIS — E872 Acidosis, unspecified: Secondary | ICD-10-CM | POA: Diagnosis not present

## 2022-07-23 DIAGNOSIS — I509 Heart failure, unspecified: Secondary | ICD-10-CM | POA: Diagnosis not present

## 2022-07-23 DIAGNOSIS — R609 Edema, unspecified: Secondary | ICD-10-CM | POA: Diagnosis not present

## 2022-07-23 DIAGNOSIS — D631 Anemia in chronic kidney disease: Secondary | ICD-10-CM | POA: Diagnosis not present

## 2022-07-23 DIAGNOSIS — I129 Hypertensive chronic kidney disease with stage 1 through stage 4 chronic kidney disease, or unspecified chronic kidney disease: Secondary | ICD-10-CM | POA: Diagnosis not present

## 2022-07-29 DIAGNOSIS — N184 Chronic kidney disease, stage 4 (severe): Secondary | ICD-10-CM | POA: Diagnosis not present

## 2022-07-29 DIAGNOSIS — D631 Anemia in chronic kidney disease: Secondary | ICD-10-CM | POA: Diagnosis not present

## 2022-07-30 ENCOUNTER — Encounter: Payer: Self-pay | Admitting: Internal Medicine

## 2022-08-08 ENCOUNTER — Encounter: Payer: Self-pay | Admitting: Internal Medicine

## 2022-08-08 ENCOUNTER — Ambulatory Visit: Payer: Medicare HMO | Admitting: Internal Medicine

## 2022-08-08 VITALS — BP 120/62 | HR 62 | Ht 70.0 in | Wt 139.4 lb

## 2022-08-08 DIAGNOSIS — E1165 Type 2 diabetes mellitus with hyperglycemia: Secondary | ICD-10-CM | POA: Diagnosis not present

## 2022-08-08 DIAGNOSIS — E1159 Type 2 diabetes mellitus with other circulatory complications: Secondary | ICD-10-CM | POA: Diagnosis not present

## 2022-08-08 DIAGNOSIS — E785 Hyperlipidemia, unspecified: Secondary | ICD-10-CM | POA: Diagnosis not present

## 2022-08-08 LAB — POCT GLYCOSYLATED HEMOGLOBIN (HGB A1C): Hemoglobin A1C: 11.9 % — AB (ref 4.0–5.6)

## 2022-08-08 MED ORDER — BASAGLAR KWIKPEN 100 UNIT/ML ~~LOC~~ SOPN: 7.0000 [IU] | PEN_INJECTOR | Freq: Every day | SUBCUTANEOUS | 11 refills | Status: DC

## 2022-08-08 NOTE — Patient Instructions (Addendum)
Please restart: - Basaglar 7 units at bedtime (if sugars in am remain >140, can increase to 8 units)  Use: - Humalog 5-7 units 15 min before the main meals.  Let me know which supplier we can use for the CGM.  The most common suppliers for the continuous glucose monitor are: [place] [employer]: [place]: [phone] Ext [place]: [place]: [place]: [phone] [place]: [phone]  Please return in 3 months with your sugar log.   PATIENT INSTRUCTIONS FOR TYPE 2 DIABETES:  **Please join MyChart!** - see attached instructions about how to join if you have not done so already.  DIET AND EXERCISE Diet and exercise is an important part of diabetic treatment.  We recommended aerobic exercise in the form of brisk walking (working between 40-60% of maximal aerobic capacity, similar to brisk walking) for 150 minutes per week (such as 30 minutes five days per week) along with 3 times per week performing 'resistance' training (using various gauge rubber tubes with handles) 5-10 exercises involving the major muscle groups (upper body, lower body and core) performing 10-15 repetitions (or near fatigue) each exercise. Start at half the above goal but build slowly to reach the above goals. If limited by weight, joint pain, or disability, we recommend daily walking in a swimming pool with water up to waist to reduce pressure from joints while allow for adequate exercise.    BLOOD GLUCOSES Monitoring your blood glucoses is important for continued management of your diabetes. Please check your blood glucoses 2-4 times a day: fasting, before meals and at bedtime (you can rotate these measurements - e.g. one day check before the 3 meals, the next day check before 2 of the meals and before bedtime, etc.).   HYPOGLYCEMIA (low blood sugar) Hypoglycemia is usually a reaction to not eating, exercising, or taking  too much insulin/ other diabetes drugs.  Symptoms include tremors, sweating, hunger, confusion, headache, etc. Treat IMMEDIATELY with 15 grams of Carbs: 4 glucose tablets  cup regular juice/soda 2 tablespoons raisins 4 teaspoons sugar 1 tablespoon honey Recheck blood glucose in 15 mins and repeat above if still symptomatic/blood glucose <100.  RECOMMENDATIONS TO REDUCE YOUR RISK OF DIABETIC COMPLICATIONS: * Take your prescribed MEDICATION(S) * Follow a DIABETIC diet: Complex carbs, fiber rich foods, (monounsaturated and polyunsaturated) fats * AVOID saturated/trans fats, high fat foods, >2,300 mg salt per day. * EXERCISE at least 5 times a week for 30 minutes or preferably daily.  * DO NOT SMOKE OR DRINK more than 1 drink a day. * Check your FEET every day. Do not wear tightfitting shoes. Contact us if you develop an ulcer * See your EYE doctor once a year or more if needed * Get a FLU shot once a year * Get a PNEUMONIA vaccine once before and once after age 31 years  GOALS:  * Your Hemoglobin A1c of <7%  * fasting sugars need to be <130 * after meals sugars need to be <180 (2h after you start eating) * Your Systolic BP should be 537 or lower  * Your Diastolic BP should be 80 or lower  * Your HDL (Good Cholesterol) should be 40 or higher  * Your LDL (Bad Cholesterol) should be 100 or lower. * Your Triglycerides should be 150 or lower  * Your Urine microalbumin (kidney function) should be <30 * Your Body Mass Index should be 25 or lower    Please consider the following ways  to cut down carbs and fat and increase fiber and micronutrients in your diet: - substitute whole grain for white bread or pasta - substitute brown rice for white rice - substitute 90-calorie flat bread pieces for slices of bread when possible - substitute sweet potatoes or yams for white potatoes - substitute humus for margarine - substitute tofu for cheese when possible - substitute almond or rice milk  for regular milk (would not drink soy milk daily due to concern for soy estrogen influence on breast cancer risk) - substitute dark chocolate for other sweets when possible - substitute water - can add lemon or orange slices for taste - for diet sodas (artificial sweeteners will trick your body that you can eat sweets without getting calories and will lead you to overeating and weight gain in the long run) - do not skip breakfast or other meals (this will slow down the metabolism and will result in more weight gain over time)  - can try smoothies made from fruit and almond/rice milk in am instead of regular breakfast - can also try old-fashioned (not instant) oatmeal made with almond/rice milk in am - order the dressing on the side when eating salad at a restaurant (pour less than half of the dressing on the salad) - eat as little meat as possible - can try juicing, but should not forget that juicing will get rid of the fiber, so would alternate with eating raw veg./fruits or drinking smoothies - use as little oil as possible, even when using olive oil - can dress a salad with a mix of balsamic vinegar and lemon juice, for e.g. - use agave nectar, stevia sugar, or regular sugar rather than artificial sweateners - steam or broil/roast veggies  - snack on veggies/fruit/nuts (unsalted, preferably) when possible, rather than processed foods - reduce or eliminate aspartame in diet (it is in diet sodas, chewing gum, etc) Read the labels!  Try to read Dr. Janene Harvey book: "Program for Reversing Diabetes" for other ideas for healthy eating.

## 2022-08-08 NOTE — Progress Notes (Signed)
Patient ID: Ethan Mcneil, male   DOB: [birth year], 88 y.o.   MRN: [id number]  HPI: Ethan Mcneil is a 88 y.o.-year-old male, returning for follow-up for DM2, dx in 2013, insulin-dependent since 2017, uncontrolled, with complications (CAD, CHF, PVD, CKD, peripheral neuropathy). Pt. previously saw Dr. Loanne Drilling, last visit 7.5 months ago.  He had part of his pancreas resected (~50%) in 2022 for presumed Pa CA but this turned out to be only a precancerous lesion.  He recently started Torsemide in 06/2022 >> lost a lot of fluid since then.  Reviewed HbA1c: Lab Results  Component Value Date   HGBA1C 8.0 (H) 04/15/2022   HGBA1C 6.4 (A) 12/21/2021   HGBA1C 5.9 (A) 08/21/2021   HGBA1C 6.6 (A) 04/17/2021   HGBA1C 7.1 (A) 01/08/2021   HGBA1C 7.4 (A) 09/04/2020   HGBA1C 9.4 (A) 07/04/2020   HGBA1C 9.9 (A) 05/02/2020   HGBA1C 7.5 (A) 12/30/2019   HGBA1C 7.1 (A) 12/21/2018   Pt is on a regimen of: - Basaglar 7 units at bedtime >> but off x2 weeks as he was not able to get a refill... - Humalog 3x a day, before meals: 6-6-6  Pt checks his sugars 2x a day and they are: - am: HI now (before stopping insulin: 150s) - 2h after b'fast: n/c - before lunch: n/c - 2h after lunch: n/c - before dinner: n/c - 2h after dinner: (before stopping insulin:110-120) - bedtime: n/c - nighttime: n/c Lowest sugar was 110; he has hypoglycemia awareness at 70.  Highest sugar was HI.  Glucometer: Freestyle lite  Pt's meals are: - Breakfast: cereal or egg bisquit - Lunch: veggies - Dinner: tomato sandwich, hamburger - Snacks: no sodas; gatorade 0, milk 2%  - 3 glasses, 1 Ensure, fruit smoothie  - + CKD, last BUN/creatinine:  Lab Results  Component Value Date   BUN 41 (A) 07/18/2022   BUN 28 (H) 06/04/2022   CREATININE 2.7 (A) 07/18/2022   CREATININE 2.80 (H) 06/04/2022  He is not on an ACE inhibitor/ARB.  - + HL; last set of lipids: Lab Results  Component Value Date   CHOL 116 04/22/2022   HDL  57 04/22/2022   LDLCALC 45 04/22/2022   TRIG 69 04/22/2022   CHOLHDL 2.0 04/22/2022  On Zocor 20 mg daily.  - last eye exam was in 04/2022. No DR reportedly.   - + numbness and tingling in his feet.  Last foot exam 08/21/2021.  He also has a history of HTN, gout, anemia of chronic disease, nephrolithiasis, hypothyroidism. He is on levothyroxine 150 mcg daily.  Latest TSH levels reviewed: Lab Results  Component Value Date   TSH 1.785 04/05/2022   TSH 2.20 12/27/2021   TSH 4.12 11/12/2021   TSH 9.19 (H) 11/02/2021   TSH 10.37 (H) 09/25/2021   TSH 3.20 05/11/2021   TSH 1.64 02/16/2021   TSH 4.74 (H) 01/04/2021   TSH 2.51 08/31/2020   TSH 3.17 06/08/2020   ROS: + see HPI No increased urination, blurry vision, nausea, chest pain.  Past Medical History:  Diagnosis Date   Anemia    Arthritis    CAD (coronary artery disease)    CABG 1997; grafts patent @ cath [facility] 11/09   DM2 (diabetes mellitus, type 2) ([place])    Gout    after tick bite   History of kidney stones    HLD (hyperlipidemia)    HTN (hypertension)    essential nos   Hypothyroid    Pancreatic lesion  Postsurgical aortocoronary bypass status    PVD (peripheral vascular disease) (HCC)    S/P herniorrhaphy    Tick fever 2012   [facility] [place]   Unspecified hyperplasia of prostate without urinary obstruction and other lower urinary tract symptoms (LUTS)    Urolithiasis 2015   hx of   Wears dentures    Wears glasses    Past Surgical History:  Procedure Laterality Date   BIOPSY  08/07/2020   Procedure: BIOPSY;  Surgeon: Irving Copas., MD;  Location: [place];  Service: Gastroenterology;;   CATARACT EXTRACTION Bilateral    CORONARY ARTERY BYPASS GRAFT     x7 vessels 1997; cath 2002 and 2009; grafts patent    [place], URETEROSCOPY AND STENT PLACEMENT Right 02/21/2014   Procedure: CYSTOSCOPY WITH RIGHT  RETROGRADE PYELOGRAM,RIGHT  URETEROSCOPY WITH STONE  BASKETING EXTRACTION;  Surgeon: Irine Seal, MD;  Location: [facility];  Service: Urology;  Laterality: Right;   ESOPHAGOGASTRODUODENOSCOPY (EGD) WITH PROPOFOL N/A 08/07/2020   Procedure: ESOPHAGOGASTRODUODENOSCOPY (EGD) WITH PROPOFOL;  Surgeon: Rush Landmark Telford Nab., MD;  Location: [place];  Service: Gastroenterology;  Laterality: N/A;   EUS N/A 08/07/2020   Procedure: UPPER ENDOSCOPIC ULTRASOUND (EUS) LINEAR;  Surgeon: Irving Copas., MD;  Location: [place];  Service: Gastroenterology;  Laterality: N/A;   FINE NEEDLE ASPIRATION N/A 08/07/2020   Procedure: FINE NEEDLE ASPIRATION (FNA) LINEAR;  Surgeon: Irving Copas., MD;  Location: [place];  Service: Gastroenterology;  Laterality: N/A;   HERNIA REPAIR     INGUINAL HERNIA REPAIR     MULTIPLE TOOTH EXTRACTIONS     SHOULDER SURGERY     SPLENECTOMY, TOTAL N/A 10/26/2020   Procedure: SPLENECTOMY;  Surgeon: Stark Klein, MD;  Location: [facility];  Service: General;  Laterality: N/A;   Social History   Socioeconomic History   Marital status: Married    Spouse name: Pat   Number of children: 4   Years of education: Not on file   Highest education level: Not on file  Occupational History   Occupation: retired, works few hours   Tobacco Use   Smoking status: Never   Smokeless tobacco: Never  Vaping Use   Vaping Use: Never used  Substance and Sexual Activity   Alcohol use: No   Drug use: No   Sexual activity: Not Currently  Other Topics Concern   Not on file  Social History Narrative   Buyer, retail and former race car driver   4 children (boys)   Radio producer form signed appointing wife - [name]; ok to leave msg on home phone [phone]         Social Determinants of Health   Financial Resource Strain: [place]  (07/16/2022)   Overall Financial Resource Strain (CARDIA)    Difficulty of Paying Living Expenses: Not hard at all  Food Insecurity: No [place] (07/16/2022)   Hunger Vital  Sign    Worried About Running Out of Food in the Last Year: Never true    [place] in the Last Year: Never true  Transportation Needs: No Transportation Needs (07/16/2022)   PRAPARE - Hydrologist (Medical): No    Lack of Transportation (Non-Medical): No  Physical Activity: Insufficiently Active (07/16/2022)   Exercise Vital Sign    Days of Exercise per Week: 5 days    Minutes of Exercise per Session: 20 min  Stress: No Stress Concern Present (07/16/2022)   [employer] of  Occupational Health - Occupational Stress Questionnaire    Feeling of Stress : Not at all  Social Connections: Moderately Isolated (07/16/2022)   Social Connection and Isolation Panel [NHANES]    Frequency of Communication with Friends and Family: More than three times a week    Frequency of Social Gatherings with Friends and Family: More than three times a week    Attends Religious Services: More than 4 times per year    Active Member of [employer] or Organizations: No    Attends Archivist Meetings: Never    Marital Status: Widowed  Intimate Partner Violence: Not At Risk (07/16/2022)   Humiliation, Afraid, Rape, and Kick questionnaire    Fear of Current or Ex-Partner: No    Emotionally Abused: No    Physically Abused: No    Sexually Abused: No   Current Outpatient Medications on File Prior to Visit  Medication Sig Dispense Refill   acetaminophen (TYLENOL) 500 MG tablet Take 500 mg by mouth every 4 (four) hours as needed for moderate pain.     albuterol (VENTOLIN HFA) 108 (90 Base) MCG/ACT inhaler Inhale 2 puffs into the lungs every 6 (six) hours as needed for wheezing or shortness of breath. (Patient not taking: Reported on 06/14/2022) 8 g 2   amLODipine (NORVASC) 10 MG tablet Take 1 tablet (10 mg total) by mouth daily. 30 tablet 0   aspirin EC 81 MG tablet Take 81 mg by mouth at bedtime.     B-D UF III MINI PEN NEEDLES 31G X 5 MM MISC USE 3 TIMES A DAY AS DIRECTED 100 each 2    Cholecalciferol (VITAMIN D3) 50 MCG (2000 UT) capsule Take 2,000 Units by mouth daily.     Continuous Blood Gluc Receiver (FREESTYLE LIBRE 2 READER) DEVI Use as instructed to check blood sugar 1 each 0   Continuous Blood Gluc Sensor (FREESTYLE LIBRE 14 DAY SENSOR) MISC USE 1 SENSOR EVERY 14 (FOURTEEN) DAYS. (Patient not taking: Reported on 06/14/2022) 6 each 3   Cyanocobalamin (B-12) 1000 MCG TABS Take 1,000 mcg by mouth daily.     FREESTYLE LITE test strip CHECK BLOOD SUGAR NO MORE THAN TWICE DAILY. 200 strip 0   hydrALAZINE (APRESOLINE) 100 MG tablet Take 100 mg by mouth 3 (three) times daily.     insulin aspart (NOVOLOG FLEXPEN) 100 UNIT/ML FlexPen 3 times a day (just before each meal), 5-6-6 units. (Patient taking differently: 6 Units 3 (three) times daily with meals.) 15 mL 11   Insulin Glargine (BASAGLAR KWIKPEN) 100 UNIT/ML Inject 6 Units into the skin daily. (Patient taking differently: Inject 7 Units into the skin daily.) 15 mL 0   isosorbide mononitrate (IMDUR) 60 MG 24 hr tablet Take 1 tablet (60 mg total) by mouth daily. 90 tablet 3   levothyroxine (SYNTHROID) 150 MCG tablet TAKE 1 TABLET BY MOUTH DAILY BEFORE BREAKFAST. (Patient taking differently: Take 150 mcg by mouth daily before breakfast.) 90 tablet 1   metoprolol succinate (TOPROL-XL) 25 MG 24 hr tablet Take 1 tablet (25 mg total) by mouth daily. 30 tablet 0   nitroGLYCERIN (NITROSTAT) 0.4 MG SL tablet Place 1 tablet (0.4 mg total) under the tongue every 5 (five) minutes x 3 doses as needed for chest pain. 25 tablet 2   omeprazole (PRILOSEC) 40 MG capsule Take 1 capsule (40 mg total) by mouth daily. 90 capsule 2   Pancrelipase, Lip-Prot-Amyl, (ZENPEP) 25000-79000 units CPEP Take 2 capsules by mouth with every meal and snacks (Patient taking differently:  Take 1 capsule by mouth daily at 12 noon.) 540 capsule 1   simvastatin (ZOCOR) 20 MG tablet TAKE 1 TABLET BY MOUTH EVERYDAY AT BEDTIME 90 tablet 1   sodium bicarbonate 650 MG  tablet Take 650 mg by mouth 2 (two) times daily.     Torsemide 40 MG TABS Take 40 mg by mouth daily. 90 tablet 0   No current facility-administered medications on file prior to visit.   Allergies  Allergen Reactions   Ciprofloxacin Itching   Codeine Nausea And Vomiting   Family History  Problem Relation Age of Onset   Lung cancer Father    Coronary artery disease Mother        stent   Diabetes Brother    Prostate cancer Maternal Uncle        in his 30s   Stroke Son        GF   Throat cancer Son    Colon cancer Neg Hx    Rectal cancer Neg Hx    Stomach cancer Neg Hx    Esophageal cancer Neg Hx    Pancreatic cancer Neg Hx    PE: BP 120/62 (BP Location: Left Arm, Patient Position: Sitting, Cuff Size: Normal)   Pulse 62   Ht '5\' 10"'$  (1.778 m)   Wt 139 lb 6.4 oz (63.2 kg)   SpO2 96%   BMI 20.00 kg/m  Wt Readings from Last 3 Encounters:  08/08/22 139 lb 6.4 oz (63.2 kg)  07/16/22 178 lb (80.7 kg)  06/14/22 178 lb 9.6 oz (81 kg)   Constitutional: under weight, in NAD Eyes: no exophthalmos ENT: moist mucous membranes, no thyromegaly, no cervical lymphadenopathy Cardiovascular: RRR, No MRG, + L>R LE edema Respiratory: CTA B Musculoskeletal: no deformities Skin: moist, warm, no rashes Neurological: no tremor with outstretched hands  ASSESSMENT: 1. DM2, insulin-dependent, uncontrolled, with complications - CAD, h/o CABG - CHF - PVD - CKD stage IV - Peripheral neuropathy - h/o hypoglycemia x1 in 2021  2. HL  PLAN:  1. Patient with long-standing, uncontrolled diabetes, on  basal/bolus insulin regimen, with suboptimal control.  Latest HbA1c was 8.0% 3.5 months ago, increased from 6.4%.  At today's visit, HbA1c is 11.9% (MUCH higher). -At today's visit, he mentions that he has been off his basal insulin (Basaglar) for the last 2 weeks as he was not able to refill it from the pharmacy.  I advised him that if this happens again in the future, to call our office right away  so we can send a prescription.  At today's visit, his blood sugars are very high, HI, in a.m. and he is not checking blood sugars later in the day recently.  I called in a higher dose of Basaglar, 7-8 units and I advised him that he may need a higher dose if sugars remain high in the morning, after he starts it.  He is taking a fixed dose of Humalog before each meal and we discussed about varying the dose with the size of his meals.  Also, I advised him to take this 15 minutes before the meal, rather than at the start of the meal or during eating.  He will do so.  He also asks me my opinion about Ensure, which he bought to supplement his meals.  I advised him that he can use this instead of a meal or add this to the meal, but not to use it between meals. When he finishes the Ensure supply, to switch to Glucerna. -  I also recommended a CGM.  He tried one in the past and he liked it, but this stopped being covered by his insurance.  I believe that the problem was that he was not getting it from a supplier.  I advised him to let me know which suppliers are covered by his insurance so I can send a prescription for freestyle libre CGM there. -Patient lost a lot of weight in the last months.  This could be related to his significant worsening of the blood sugars, but he tells me that he was also started on torsemide last month and lost a lot of fluid. -He also tells me that he had a pancreatic resection last year.  He is likely insulin deficient.  As of now, sugars are likely too high to check, but we will check his insulin production at next visit. - I suggested to:  Patient Instructions  Please restart: - Basaglar 7 units at bedtime (if sugars in am remain >140, can increase to 8 units)  Use: - Humalog 5-7 units 15 min before the main meals.  Let me know which supplier we can use for the CGM.  The most common suppliers for the continuous glucose monitor are: [place] [employer]: [place]:  [phone] [employer]: [place]: [place]: [phone] [place]: [phone]  Please return in 3 months with your sugar log.   - check sugars at different times of the day - check 4x a day, rotating checks - discussed about CBG targets for treatment: 80-130 mg/dL before meals and <180 mg/dL after meals; target HbA1c <7%. - given foot care handout  - given instructions for hypoglycemia management "15-15 rule"  - advised for yearly eye exams  - Return to clinic in 3 mo with sugar log or CGM   2. HL - Reviewed latest lipid panel from 04/2022: All fractions at goal: Lab Results  Component Value Date   CHOL 116 04/22/2022   HDL 57 04/22/2022   LDLCALC 45 04/22/2022   TRIG 69 04/22/2022   CHOLHDL 2.0 04/22/2022  - Continues Zocor 20 mg daily without side effects.  - Total time spent for the visit: 40 min, in precharting, reviewing Dr. Cordelia Pen last note, obtaining medical information from the chart and from the pt, reviewing his  previous labs, evaluations, and treatments, reviewing his symptoms, counseling him about his diabetes (please see the discussed topics above), and developing a plan to further treat it.  Philemon Kingdom, MD PhD [facility] Endocrinology

## 2022-08-12 DIAGNOSIS — D631 Anemia in chronic kidney disease: Secondary | ICD-10-CM | POA: Diagnosis not present

## 2022-08-12 DIAGNOSIS — N184 Chronic kidney disease, stage 4 (severe): Secondary | ICD-10-CM | POA: Diagnosis not present

## 2022-08-26 DIAGNOSIS — D631 Anemia in chronic kidney disease: Secondary | ICD-10-CM | POA: Diagnosis not present

## 2022-08-26 DIAGNOSIS — N184 Chronic kidney disease, stage 4 (severe): Secondary | ICD-10-CM | POA: Diagnosis not present

## 2022-09-02 ENCOUNTER — Other Ambulatory Visit: Payer: Self-pay | Admitting: Internal Medicine

## 2022-09-09 ENCOUNTER — Telehealth: Payer: Self-pay | Admitting: *Deleted

## 2022-09-09 ENCOUNTER — Encounter: Payer: Self-pay | Admitting: *Deleted

## 2022-09-09 DIAGNOSIS — D631 Anemia in chronic kidney disease: Secondary | ICD-10-CM | POA: Diagnosis not present

## 2022-09-09 DIAGNOSIS — N184 Chronic kidney disease, stage 4 (severe): Secondary | ICD-10-CM | POA: Diagnosis not present

## 2022-09-09 NOTE — Patient Outreach (Signed)
Care Coordination   Initial Visit Note   09/09/2022 Name: Ethan Mcneil MRN: [id number] DOB: [birth year]  Ethan Mcneil is a 88 y.o. year old male who sees Larose Kells, Alda Berthold, MD for primary care. I spoke with  [name] by phone today.  What matters to the patients health and wellness today?  "I am doing overall good; my blood sugars are still staying high, I am not sure why- I am doing everything the doctor told me to; I am going to try to increase the insulin dosing like she told me to to see if that helps.  My daily weights are staying good, right in the right range and the swelling I was having has completely gone away.  I have been having some general feelings of lightheadedness, not dizziness, but just a general feeling of lightheadedness since I have been taking the fluid pill; I don't feel like I am going to fall out or anything like that; I am going to mention this to my heart doctor and to Dr. Larose Kells; my blood pressures are all fine at home-- this morning it was 134/68"  Patient declines further/ ongoing need for care coordination follow up- I provided my direct phone number to him should he have questions or concerns in the future, and encouraged him to contact me as/ if needed- patient is agreeable   Goals Addressed             This Visit's Progress    COMPLETED: Care Coordination Actvities: No follow up required   On track    Care Coordination Interventions: Provided education to patient about basic DM disease process Reviewed medications with patient and discussed importance of medication adherence Counseled on importance of regular laboratory monitoring as prescribed Reviewed scheduled/upcoming provider appointments including: 10/07/22- PCP; 12/05/22- cardiology provider; 12/11/22- endocrinology provider Review of patient status, including review of consultants reports, relevant laboratory and other test results, and medications completed Assessed social determinant of  health barriers Reviewed recent blood sugars at home with patient: he reports ongoing increased blood sugars post-recent endocrinology provider office visit: reviewed provider notes/ instructions to increase insulin accordingly- patient reports he will do; declines specific review of blood sugars today; states he monitors at home 2-3 times per day, reports "they are still in the 300 range;" instructed patient to make endocrinology provider aware of ongoing increased blood sugars if they do not improve with increased insulin dosing at home as per provider instructions at time of office visit 08/08/22; he confirms he has not experienced any low blood sugars at home  Basic overview and discussion of pathophysiology of Heart Failure reviewed Provided education on low sodium diet Assessed need for readable accurate scales in home Provided education about placing scale on hard, flat surface Advised patient to weigh each morning after emptying bladder Discussed importance of daily weight and advised patient to weigh and record daily Reviewed role of diuretics in prevention of fluid overload and management of heart failure; Discussed the importance of keeping all appointments with provider Advised patient to discuss his reports of intermittent lightheadedness without dizziness; reports continues taking Torsemide 40 mg QD-- states "the swelling in my legs has completely gone away now;" confirms he continues to monitor daily weights at home- reviewed with patient and he reports consistent daily weight ranges between 138-140 lbs; he is able to accurately verbalize weight gain guidelines/ action plan without prompting  with provider Confirmed patient has plans to obtain flu vaccine for 2023-24 flu/ winter  season; confirmed he has not yet obtained shingles vaccine-- attempted to obtain, but his outpatient pharmacy was out- he will attempt to get this completed "soon" and this was encouraged; general infection  prevention measures discussed with patient and he verbalizes good understanding of same          SDOH assessments and interventions completed:  Yes  SDOH Interventions Today    Flowsheet Row Most Recent Value  SDOH Interventions   Food Insecurity Interventions Intervention Not Indicated  Transportation Interventions Intervention Not Indicated  [drives self]       Care Coordination Interventions Activated:  Yes  Care Coordination Interventions:  Yes, provided   Follow up plan: No further intervention required.   Encounter Outcome:  Pt. Visit Completed   Oneta Rack, RN, BSN, CCRN Alumnus RN CM Care Coordination/ Transition of [place] [facility] [phone]: direct office

## 2022-09-17 ENCOUNTER — Other Ambulatory Visit: Payer: Self-pay | Admitting: Internal Medicine

## 2022-09-23 DIAGNOSIS — N184 Chronic kidney disease, stage 4 (severe): Secondary | ICD-10-CM | POA: Diagnosis not present

## 2022-09-23 DIAGNOSIS — D631 Anemia in chronic kidney disease: Secondary | ICD-10-CM | POA: Diagnosis not present

## 2022-10-07 ENCOUNTER — Encounter: Payer: Self-pay | Admitting: Internal Medicine

## 2022-10-07 ENCOUNTER — Ambulatory Visit (INDEPENDENT_AMBULATORY_CARE_PROVIDER_SITE_OTHER): Payer: Medicare HMO | Admitting: Internal Medicine

## 2022-10-07 VITALS — BP 138/68 | HR 70 | Temp 97.9°F | Resp 18 | Ht 70.0 in | Wt 143.1 lb

## 2022-10-07 DIAGNOSIS — I10 Essential (primary) hypertension: Secondary | ICD-10-CM

## 2022-10-07 DIAGNOSIS — L89151 Pressure ulcer of sacral region, stage 1: Secondary | ICD-10-CM

## 2022-10-07 DIAGNOSIS — Z23 Encounter for immunization: Secondary | ICD-10-CM | POA: Diagnosis not present

## 2022-10-07 DIAGNOSIS — E1151 Type 2 diabetes mellitus with diabetic peripheral angiopathy without gangrene: Secondary | ICD-10-CM | POA: Diagnosis not present

## 2022-10-07 DIAGNOSIS — E038 Other specified hypothyroidism: Secondary | ICD-10-CM | POA: Diagnosis not present

## 2022-10-07 DIAGNOSIS — L989 Disorder of the skin and subcutaneous tissue, unspecified: Secondary | ICD-10-CM

## 2022-10-07 DIAGNOSIS — Z794 Long term (current) use of insulin: Secondary | ICD-10-CM | POA: Diagnosis not present

## 2022-10-07 NOTE — Patient Instructions (Addendum)
We are referring you to dermatology  Vaccines I recommend:  Covid booster RSV vaccine   Check the  blood pressure regularly BP GOAL is between 110/65 and  135/85. If it is consistently higher or lower, let me know     GO TO THE FRONT DESK, PLEASE SCHEDULE YOUR APPOINTMENTS Come back for checkup in 4 months  Skin lesion: Sheep  skin, frequent turning, special pillow are options to help the area heal We are referring you to dermatology

## 2022-10-07 NOTE — Progress Notes (Signed)
Subjective:    Patient ID: Ethan Mcneil, male    DOB: [birth year], 88 y.o.   MRN: [id number]  DOS:  10/07/2022 Type of visit - description: Follow-up, here with his son  Saw endocrinology and nephrology, notes reviewed.  In general feels well.  Has chronic lightheadedness, for months, it is a constant sensation although intensity changes day by day. Not described as dizziness, no worse when he stands up, no worse when he turns in bed. No associated with chest pain or difficulty breathing.  No palpitations No headache, diplopia.  No slurred speech or motor deficit. Did admit that his stamina and strength is not the same but denies DOE.  Also, has developed a "place" at the right buttock, for the last year.  Edema is better  Wt Readings from Last 3 Encounters:  10/07/22 143 lb 2 oz (64.9 kg)  08/08/22 139 lb 6.4 oz (63.2 kg)  07/16/22 178 lb (80.7 kg)    Review of Systems See above   Past Medical History:  Diagnosis Date   Anemia    Arthritis    CAD (coronary artery disease)    CABG 1997; grafts patent @ cath [facility] 11/09   DM2 (diabetes mellitus, type 2) ([place])    Gout    after tick bite   History of kidney stones    HLD (hyperlipidemia)    HTN (hypertension)    essential nos   Hypothyroid    Pancreatic lesion    Postsurgical aortocoronary bypass status    PVD (peripheral vascular disease) ([place])    S/P herniorrhaphy    Tick fever 2012   [facility] , [place]   Unspecified hyperplasia of prostate without urinary obstruction and other lower urinary tract symptoms (LUTS)    Urolithiasis 2015   hx of   Wears dentures    Wears glasses     Past Surgical History:  Procedure Laterality Date   BIOPSY  08/07/2020   Procedure: BIOPSY;  Surgeon: Irving Copas., MD;  Location: [place];  Service: Gastroenterology;;   CATARACT EXTRACTION Bilateral    CORONARY ARTERY BYPASS GRAFT     x7 vessels 1997; cath 2002 and 2009; grafts patent     [place], URETEROSCOPY AND STENT PLACEMENT Right 02/21/2014   Procedure: CYSTOSCOPY WITH RIGHT  RETROGRADE PYELOGRAM,RIGHT  URETEROSCOPY WITH STONE BASKETING EXTRACTION;  Surgeon: Irine Seal, MD;  [facility];  Service: Urology;  Laterality: Right;   ESOPHAGOGASTRODUODENOSCOPY (EGD) WITH PROPOFOL N/A 08/07/2020   Procedure: ESOPHAGOGASTRODUODENOSCOPY (EGD) WITH PROPOFOL;  Surgeon: Rush Landmark Telford Nab., MD;  Location: [place];  Service: Gastroenterology;  Laterality: N/A;   EUS N/A 08/07/2020   Procedure: UPPER ENDOSCOPIC ULTRASOUND (EUS) LINEAR;  Surgeon: Irving Copas., MD;  Location: [place];  Service: Gastroenterology;  Laterality: N/A;   FINE NEEDLE ASPIRATION N/A 08/07/2020   Procedure: FINE NEEDLE ASPIRATION (FNA) LINEAR;  Surgeon: Irving Copas., MD;  Location: [place];  Service: Gastroenterology;  Laterality: N/A;   HERNIA REPAIR     INGUINAL HERNIA REPAIR     MULTIPLE TOOTH EXTRACTIONS     SHOULDER SURGERY     SPLENECTOMY, TOTAL N/A 10/26/2020   Procedure: SPLENECTOMY;  Surgeon: Stark Klein, MD;  Location: [place];  Service: General;  Laterality: N/A;    Current Outpatient Medications  Medication Instructions   acetaminophen (TYLENOL) 500 mg, Oral, Every 4 hours PRN   albuterol (VENTOLIN HFA) 108 (90 Base) MCG/ACT inhaler 2 puffs, Inhalation, Every 6 hours PRN  amLODipine (NORVASC) 10 mg, Oral, Daily   aspirin EC 81 mg, Oral, Daily at bedtime   B-12 1,000 mcg, Oral, Daily   B-D UF III MINI PEN NEEDLES 31G X 5 MM MISC USE 3 TIMES A DAY AS DIRECTED   Basaglar KwikPen 7-8 Units, Subcutaneous, Daily at bedtime   Continuous Blood Gluc Receiver (FREESTYLE LIBRE 2 READER) DEVI Use as instructed to check blood sugar   Continuous Blood Gluc Sensor (FREESTYLE LIBRE 14 DAY SENSOR) MISC USE 1 SENSOR EVERY 14 (FOURTEEN) DAYS.   FREESTYLE LITE test strip CHECK BLOOD SUGAR NO MORE THAN TWICE DAILY.   hydrALAZINE (APRESOLINE)  100 mg, Oral, 3 times daily   insulin aspart (NOVOLOG FLEXPEN) 100 UNIT/ML FlexPen 3 times a day (just before each meal), 5-6-6 units.   isosorbide mononitrate (IMDUR) 60 mg, Oral, Daily   levothyroxine (SYNTHROID) 150 mcg, Oral, Daily before breakfast   metoprolol succinate (TOPROL-XL) 25 mg, Oral, Daily   nitroGLYCERIN (NITROSTAT) 0.4 mg, Sublingual, Every 5 min x3 PRN   omeprazole (PRILOSEC) 40 mg, Oral, Daily   Pancrelipase, Lip-Prot-Amyl, (ZENPEP) 25000-79000 units CPEP Take 2 capsules by mouth with every meal and snacks   simvastatin (ZOCOR) 20 MG tablet TAKE 1 TABLET BY MOUTH EVERYDAY AT BEDTIME   sodium bicarbonate 650 mg, Oral, 2 times daily   torsemide (DEMADEX) 40 mg, Oral, Daily   Vitamin D3 2,000 Units, Oral, Daily       Objective:   Physical Exam BP 138/68   Pulse 70   Temp 97.9 F (36.6 C) (Oral)   Resp 18   Ht '5\' 10"'$  (1.778 m)   Wt 143 lb 2 oz (64.9 kg)   SpO2 93%   BMI 20.54 kg/m  General:   Well developed, NAD, BMI noted.  HEENT:  Normocephalic . Face symmetric, atraumatic Lungs:  CTA B Normal respiratory effort, no intercostal retractions, no accessory muscle use. Heart: RRR,  no murmur.  Abdomen:  Not distended, soft, non-tender. No rebound or rigidity.   Skin: At the R buttock, sacral area has round, superficial lesion, borders are slightly elevated more consistent.  Is not hyperpigmented.  No openings.  No redness. Lower extremities: no pretibial edema on the right, trace on the left. Neurologic:  alert & oriented X3.  Speech normal, gait appropriate for age and unassisted Psych--  Cognition and judgment appear intact.  Cooperative with normal attention span and concentration.  Behavior appropriate. No anxious or depressed appearing.     Assessment     Assessment DM -- w/ CAD, no neuropathy, intolerant to metformin , sees ENDO HTN Hyperkalemia-- off  ACEs Hyperlipidemia Hypothyroidism DJD BPH, LUTS,h/o urolithiasis -- sees urology CKD:  started to see renal 06/2020 Anemia  CV: --CAD, CABG 1997, cardiac cath 2009 --PVD (no ABIs in the chart) --L leg edema, chronic, (-) DVT per [place] 2015 -- CHF Gout GI: Pancreatic insufficiency dx 02-2017 B 12 deficiency, mild 2015  Wt loss:  2012 187 lb, 2014 173 lb , 2015 167 01-2017: EGD normal, BX chronic active gastritis. Colonoscopy normal. Saw GI 02-2017: likely d/t Pancreatic insufficiency and poorly controlled diabetes. 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  Surgeries 10-2020 Asplenia   PLAN: DM: Last visit with Endo August 2023, A1c was 11.9.  Meds adjusted. HTN: After labs last visit, torsemide was decreased to 40 mg daily.  Good compliance.  He also  takes amlodipine, hydralazine, Imdur, metoprolol.  Ambulatory BPs are very good in the 130/60 and 140/70. Hypothyroidism: Good med compliance, last TSH ok CKD: Saw nephrology July 23, 2022, they noted his last creatinine was 2.7, considered his baseline.  Next visit 4 months, patient reports she has been treated by nephrology for anemia.  Not pale on exam today. Lesion, R buttock, suspect this is a pressure ulcer, strategies to promote healing discussed, including using a sheepskin/decrease pressure on the area. At the same time I will refer him to dermatology, this has not healed in a year, biopsy?. Lightheadedness: this is going on for a while, no associated symptoms, we agreed on observation. Fall prevention: Extensive discussion about fall prevention in light of lightheadedness, pointers provided.   Preventive care: Flu shot today, encouraged COVID booster. RTC 4 months.  Time spent 30 minutes due to chart review, extensive discussion about fall prevention, and how to promote healing of right buttock lesion.

## 2022-10-08 NOTE — Assessment & Plan Note (Signed)
DM: Last visit with Endo August 2023, A1c was 11.9.  Meds adjusted. HTN: After labs last visit, torsemide was decreased to 40 mg daily.  Good compliance.  He also takes amlodipine, hydralazine, Imdur, metoprolol.  Ambulatory BPs are very good in the 130/60 and 140/70. Hypothyroidism: Good med compliance, last TSH ok CKD: Saw nephrology July 23, 2022, they noted his last creatinine was 2.7, considered his baseline.  Next visit 4 months, patient reports she has been treated by nephrology for anemia.  Not pale on exam today. Lesion, R buttock, suspect this is a pressure ulcer, strategies to promote healing discussed, including using a sheepskin/decrease pressure on the area. At the same time I will refer him to dermatology, this has not healed in a year, biopsy?. Lightheadedness: this is going on for a while, no associated symptoms, we agreed on observation. Fall prevention: Extensive discussion about fall prevention in light of lightheadedness, pointers provided.   Preventive care: Flu shot today, encouraged COVID booster. RTC 4 months.

## 2022-10-10 DIAGNOSIS — L8990 Pressure ulcer of unspecified site, unspecified stage: Secondary | ICD-10-CM | POA: Diagnosis not present

## 2022-10-14 DIAGNOSIS — N184 Chronic kidney disease, stage 4 (severe): Secondary | ICD-10-CM | POA: Diagnosis not present

## 2022-10-14 DIAGNOSIS — D631 Anemia in chronic kidney disease: Secondary | ICD-10-CM | POA: Diagnosis not present

## 2022-10-21 IMAGING — CT CT ABD-PEL WO/W CM
2 of 11 series · 14 of 46 positions shown, 16 images · IV contrast (iopamidol)
Comparison: CT the abdomen and pelvis 11/28/2016.

CLINICAL DATA: 86-year-old male with history of intraductal
papillary mucinous neoplasm. Follow-up study.

EXAM:
CT ABDOMEN AND PELVIS WITHOUT AND WITH CONTRAST
TECHNIQUE: Multidetector CT imaging of the abdomen and pelvis was performed
following the standard protocol before and following the bolus
administration of intravenous contrast.
CONTRAST:  80mL LLFBMW-IQQ IOPAMIDOL (LLFBMW-IQQ) INJECTION 61%

[Series 7: arterial phase 2.00 br36 s3 cor arterial · coronal · arterial · 0.41mm/px · 3 of 130 slices shown]
[im 33/130  soft-tissue]
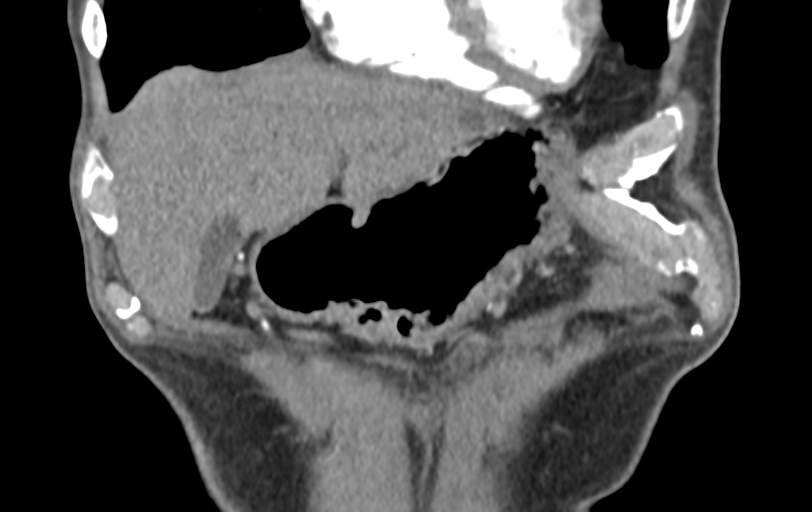
[im 65/130  soft-tissue]
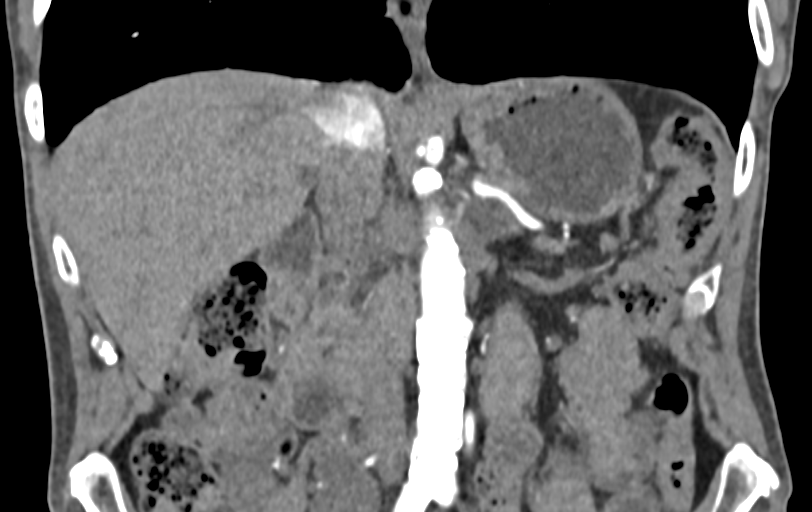
[im 97/130  soft-tissue]
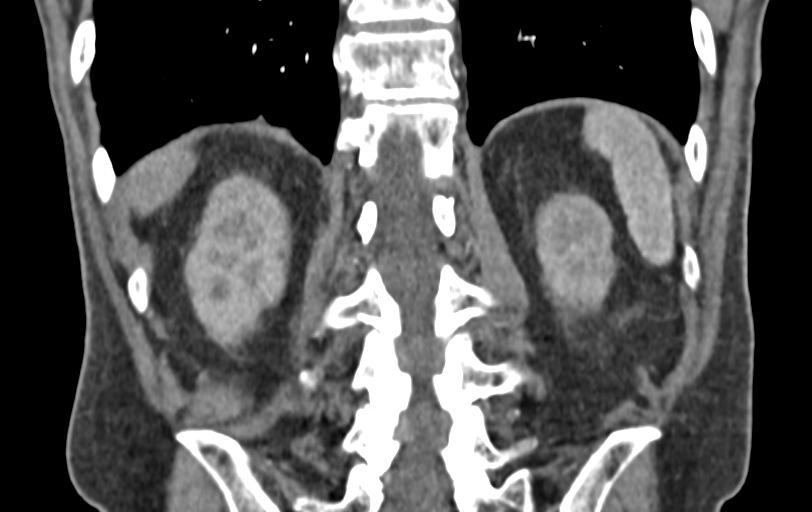

[Series 11: venous phase 2.00 br40 s3 pancreas venous · axial · portal-venous · 0.70mm/px · z∈[+1154,+1506]mm · 11 of 212 slices shown, 13 images]
[im 18/212  soft-tissue]
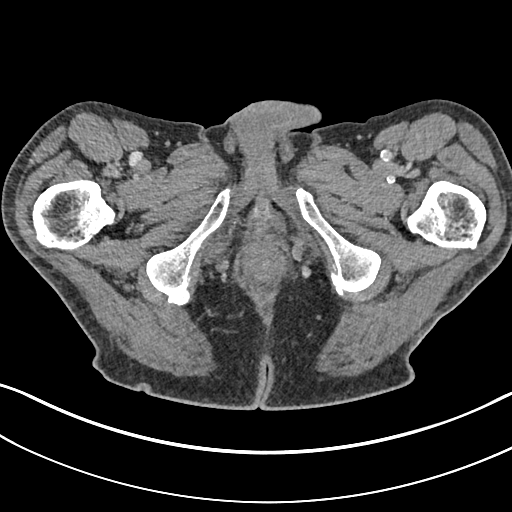
[im 18/212  bone]
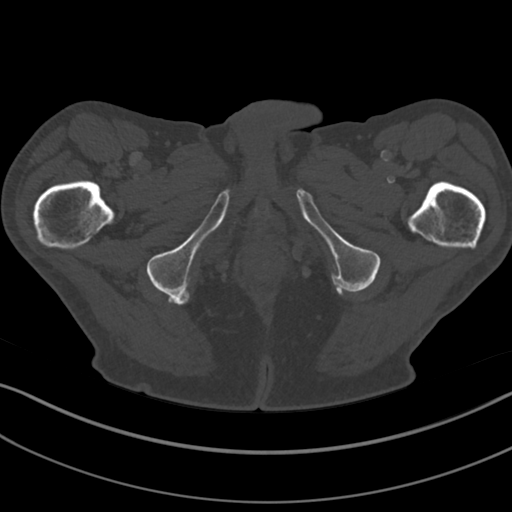
[im 36/212  soft-tissue]
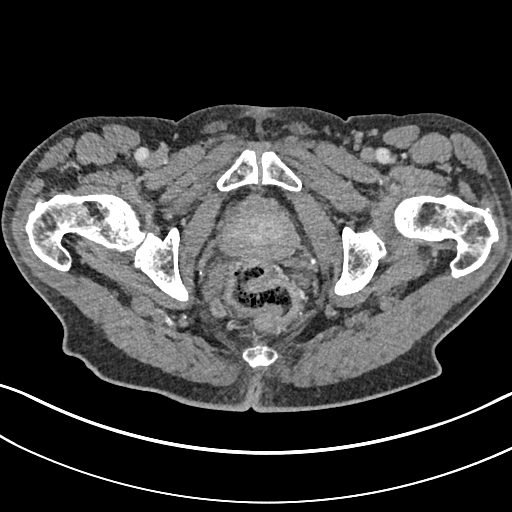
[im 53/212  soft-tissue]
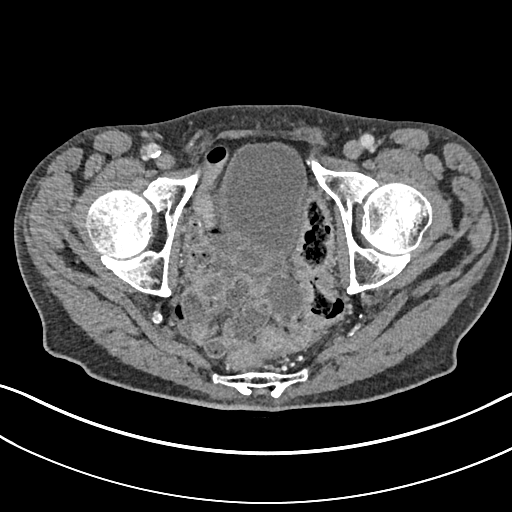
[im 71/212  soft-tissue]
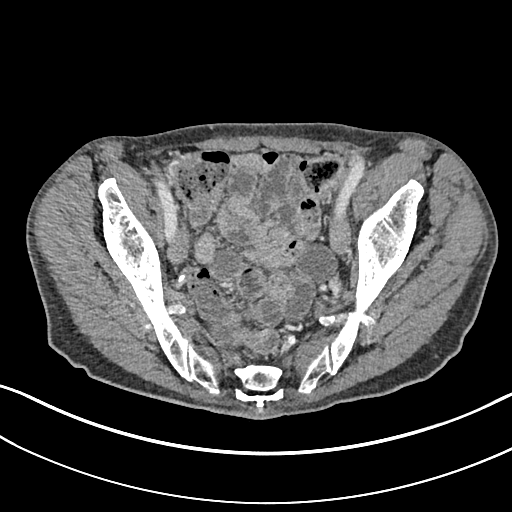
[im 88/212  soft-tissue]
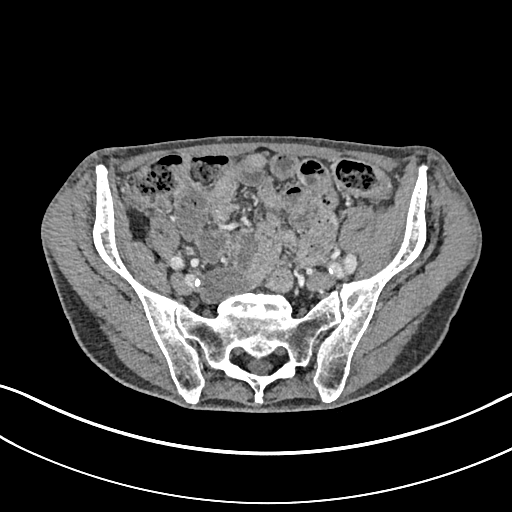
[im 106/212  soft-tissue]
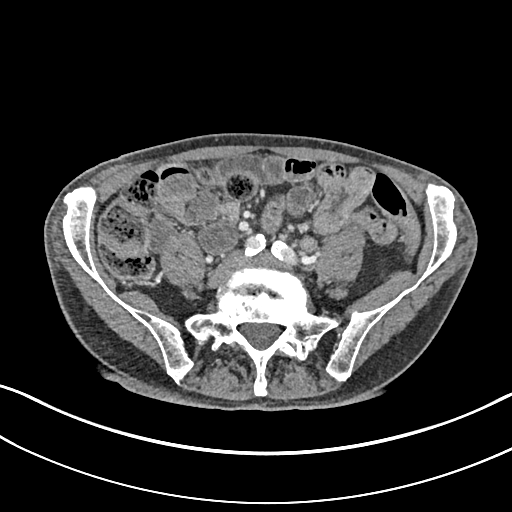
[im 124/212  soft-tissue]
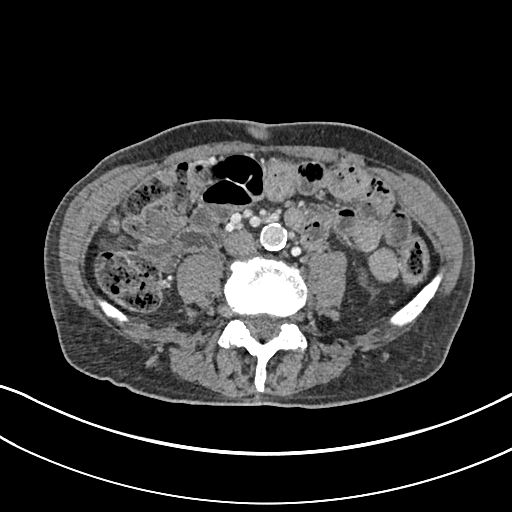
[im 141/212  soft-tissue]
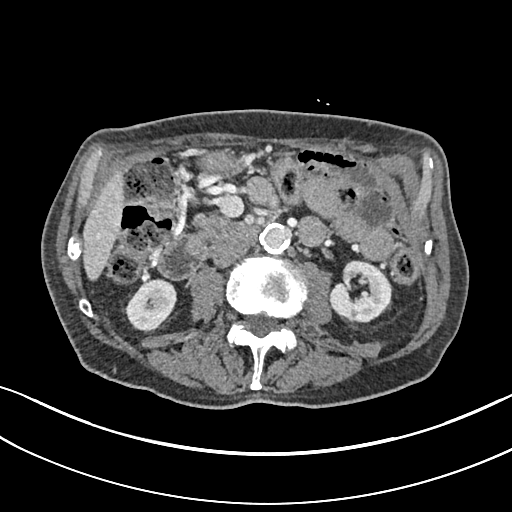
[im 159/212  soft-tissue]
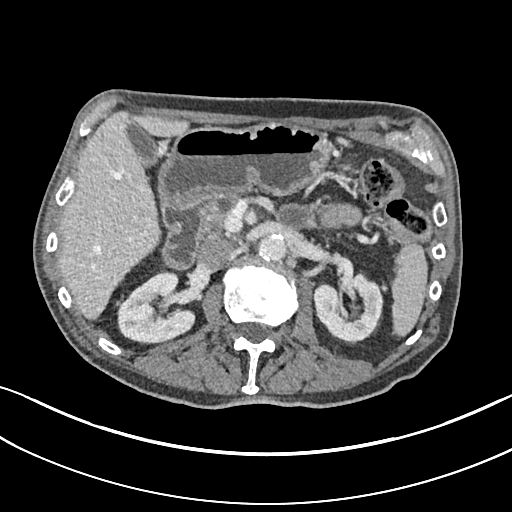
[im 159/212  bone]
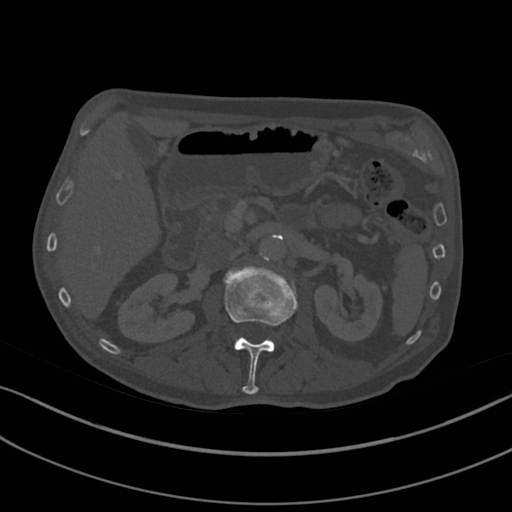
[im 176/212  soft-tissue]
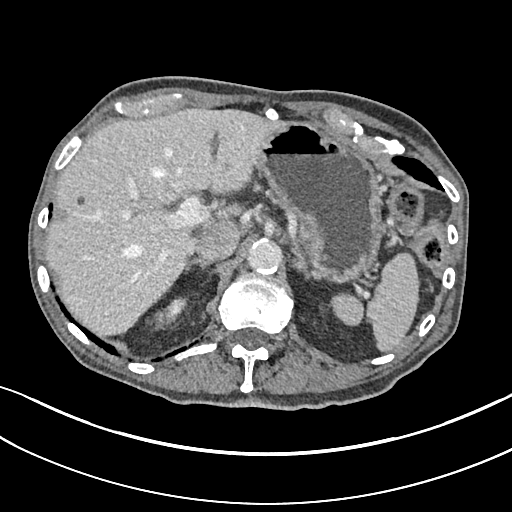
[im 194/212  soft-tissue]
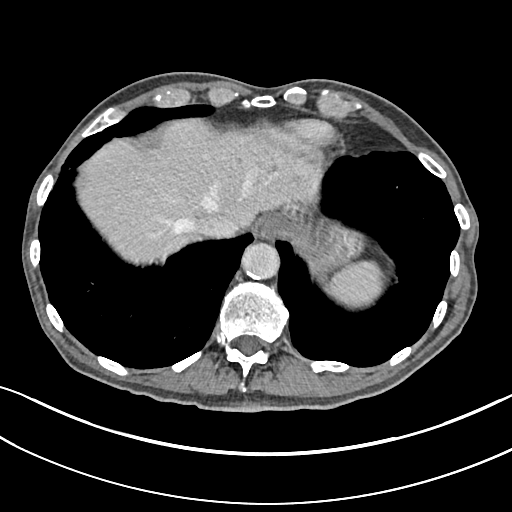

[14 of 46 positions shown; findings below may reference images not displayed]

FINDINGS: Lower chest: Atherosclerotic calcifications in the descending
thoracic aorta as well as the distal left circumflex coronary
artery.

Hepatobiliary: 1.3 cm low-attenuation lesion in segment 2 of the
liver, compatible with a simple cyst. Multiple other subcentimeter
low-attenuation lesions scattered elsewhere throughout the hepatic
parenchyma, too small to definitively characterize, but
statistically likely to represent tiny cysts. No other suspicious
appearing hepatic lesions. No intra or extrahepatic biliary ductal
dilatation. Gallbladder is normal in appearance.

Pancreas: Large mass centered in the distal pancreatic body (axial
image 45 of series 11 and coronal image 55 of series 14) currently
measuring 3.5 x 1.9 x 3.6 cm, a centrally low-attenuation with some
peripheral nodular soft tissue which enhances similar to and
slightly greater than normal adjacent pancreatic parenchyma.
Pancreatic divisum anatomy (type 3) again noted. Dilatation of the
main pancreatic duct which measures up to 6 mm in diameter in the
proximal pancreatic body.

Spleen: Unremarkable.

Adrenals/Urinary Tract: Several subcentimeter low-attenuation
lesions in the kidneys bilaterally, too small to definitively
characterize, but statistically likely to represent tiny cysts.
Bilateral kidneys and adrenal glands are otherwise normal in
appearance. No hydroureteronephrosis. Urinary bladder is normal in
appearance.

Stomach/Bowel: Normal appearance of the stomach. No pathologic
dilatation of small bowel or colon. The appendix is not confidently
identified and may be surgically absent. Regardless, there are no
inflammatory changes noted adjacent to the cecum to suggest the
presence of an acute appendicitis at this time.

Vascular/Lymphatic: Aortic atherosclerosis, without evidence of
aneurysm or dissection in the abdominal or pelvic vasculature. No
lymphadenopathy noted in the abdomen or pelvis.

Reproductive: Prostate gland and seminal vesicles are unremarkable
in appearance.

Other: No significant volume of ascites.  No pneumoperitoneum.

Musculoskeletal: There are no aggressive appearing lytic or blastic
lesions noted in the visualized portions of the skeleton.
IMPRESSION: 1. Large low-attenuation lesion in the distal pancreatic body,
similar to recent prior examinations, again concerning for neoplasm
such as intraductal papillary mucinous neoplasm (IPMN). This is once
again associated with dilatation of the main pancreatic duct.
2. No definitive findings to suggest metastatic disease in the
abdomen or pelvis.
3. Aortic atherosclerosis, in addition to least left circumflex
coronary artery disease.
4. Additional incidental findings, as above.

## 2022-10-28 DIAGNOSIS — D631 Anemia in chronic kidney disease: Secondary | ICD-10-CM | POA: Diagnosis not present

## 2022-10-28 DIAGNOSIS — N184 Chronic kidney disease, stage 4 (severe): Secondary | ICD-10-CM | POA: Diagnosis not present

## 2022-11-11 DIAGNOSIS — D631 Anemia in chronic kidney disease: Secondary | ICD-10-CM | POA: Diagnosis not present

## 2022-11-11 DIAGNOSIS — N184 Chronic kidney disease, stage 4 (severe): Secondary | ICD-10-CM | POA: Diagnosis not present

## 2022-11-11 LAB — BASIC METABOLIC PANEL
BUN: 49 — AB (ref 4–21)
CO2: 19 (ref 13–22)
Chloride: 96 — AB (ref 99–108)
Creatinine: 2.6 — AB (ref 0.6–1.3)
Glucose: 134
Potassium: 4.4 mEq/L (ref 3.5–5.1)
Sodium: 133 — AB (ref 137–147)

## 2022-11-11 LAB — IRON,TIBC AND FERRITIN PANEL
%SAT: 23
Ferritin: 312
Iron: 50
TIBC: 214
UIBC: 164

## 2022-11-11 LAB — COMPREHENSIVE METABOLIC PANEL
Albumin: 3.8 (ref 3.5–5.0)
Calcium: 8.6 — AB (ref 8.7–10.7)
eGFR: 24

## 2022-11-11 LAB — VITAMIN D 25 HYDROXY (VIT D DEFICIENCY, FRACTURES): Vit D, 25-Hydroxy: 31.7

## 2022-11-11 LAB — CBC AND DIFFERENTIAL
HCT: 30 — AB (ref 41–53)
Hemoglobin: 9.6 — AB (ref 13.5–17.5)
Neutrophils Absolute: 5
WBC: 9

## 2022-11-11 LAB — CBC: RBC: 3.34 — AB (ref 3.87–5.11)

## 2022-11-18 DIAGNOSIS — I129 Hypertensive chronic kidney disease with stage 1 through stage 4 chronic kidney disease, or unspecified chronic kidney disease: Secondary | ICD-10-CM | POA: Diagnosis not present

## 2022-11-18 DIAGNOSIS — N189 Chronic kidney disease, unspecified: Secondary | ICD-10-CM | POA: Diagnosis not present

## 2022-11-18 DIAGNOSIS — D631 Anemia in chronic kidney disease: Secondary | ICD-10-CM | POA: Diagnosis not present

## 2022-11-18 DIAGNOSIS — E872 Acidosis, unspecified: Secondary | ICD-10-CM | POA: Diagnosis not present

## 2022-11-18 DIAGNOSIS — N184 Chronic kidney disease, stage 4 (severe): Secondary | ICD-10-CM | POA: Diagnosis not present

## 2022-11-18 DIAGNOSIS — N2581 Secondary hyperparathyroidism of renal origin: Secondary | ICD-10-CM | POA: Diagnosis not present

## 2022-11-18 DIAGNOSIS — E875 Hyperkalemia: Secondary | ICD-10-CM | POA: Diagnosis not present

## 2022-11-18 DIAGNOSIS — R609 Edema, unspecified: Secondary | ICD-10-CM | POA: Diagnosis not present

## 2022-11-19 DIAGNOSIS — A0839 Other viral enteritis: Secondary | ICD-10-CM | POA: Diagnosis not present

## 2022-11-19 DIAGNOSIS — I2510 Atherosclerotic heart disease of native coronary artery without angina pectoris: Secondary | ICD-10-CM | POA: Diagnosis not present

## 2022-11-19 DIAGNOSIS — I10 Essential (primary) hypertension: Secondary | ICD-10-CM | POA: Diagnosis not present

## 2022-11-19 DIAGNOSIS — Z9081 Acquired absence of spleen: Secondary | ICD-10-CM | POA: Diagnosis not present

## 2022-11-19 DIAGNOSIS — R079 Chest pain, unspecified: Secondary | ICD-10-CM | POA: Diagnosis not present

## 2022-11-19 DIAGNOSIS — Z20822 Contact with and (suspected) exposure to covid-19: Secondary | ICD-10-CM | POA: Diagnosis not present

## 2022-11-19 DIAGNOSIS — I5030 Unspecified diastolic (congestive) heart failure: Secondary | ICD-10-CM | POA: Diagnosis not present

## 2022-11-19 DIAGNOSIS — N184 Chronic kidney disease, stage 4 (severe): Secondary | ICD-10-CM | POA: Diagnosis not present

## 2022-11-19 DIAGNOSIS — I5032 Chronic diastolic (congestive) heart failure: Secondary | ICD-10-CM | POA: Diagnosis not present

## 2022-11-19 DIAGNOSIS — Z1152 Encounter for screening for COVID-19: Secondary | ICD-10-CM | POA: Diagnosis not present

## 2022-11-19 DIAGNOSIS — R1084 Generalized abdominal pain: Secondary | ICD-10-CM | POA: Diagnosis not present

## 2022-11-19 DIAGNOSIS — R7989 Other specified abnormal findings of blood chemistry: Secondary | ICD-10-CM | POA: Insufficient documentation

## 2022-11-19 DIAGNOSIS — R10819 Abdominal tenderness, unspecified site: Secondary | ICD-10-CM | POA: Diagnosis not present

## 2022-11-19 DIAGNOSIS — I130 Hypertensive heart and chronic kidney disease with heart failure and stage 1 through stage 4 chronic kidney disease, or unspecified chronic kidney disease: Secondary | ICD-10-CM | POA: Diagnosis not present

## 2022-11-19 DIAGNOSIS — E871 Hypo-osmolality and hyponatremia: Secondary | ICD-10-CM | POA: Diagnosis not present

## 2022-11-19 DIAGNOSIS — R109 Unspecified abdominal pain: Secondary | ICD-10-CM | POA: Diagnosis not present

## 2022-11-19 DIAGNOSIS — E1122 Type 2 diabetes mellitus with diabetic chronic kidney disease: Secondary | ICD-10-CM | POA: Diagnosis not present

## 2022-11-19 DIAGNOSIS — R1110 Vomiting, unspecified: Secondary | ICD-10-CM | POA: Diagnosis not present

## 2022-11-19 DIAGNOSIS — E860 Dehydration: Secondary | ICD-10-CM | POA: Diagnosis not present

## 2022-11-19 DIAGNOSIS — B341 Enterovirus infection, unspecified: Secondary | ICD-10-CM | POA: Diagnosis not present

## 2022-11-19 DIAGNOSIS — E039 Hypothyroidism, unspecified: Secondary | ICD-10-CM | POA: Diagnosis not present

## 2022-11-19 DIAGNOSIS — K529 Noninfective gastroenteritis and colitis, unspecified: Secondary | ICD-10-CM | POA: Diagnosis not present

## 2022-11-19 DIAGNOSIS — J90 Pleural effusion, not elsewhere classified: Secondary | ICD-10-CM | POA: Diagnosis not present

## 2022-11-19 DIAGNOSIS — E782 Mixed hyperlipidemia: Secondary | ICD-10-CM | POA: Diagnosis not present

## 2022-11-19 DIAGNOSIS — Z794 Long term (current) use of insulin: Secondary | ICD-10-CM | POA: Diagnosis not present

## 2022-11-19 DIAGNOSIS — Z66 Do not resuscitate: Secondary | ICD-10-CM | POA: Diagnosis not present

## 2022-11-19 DIAGNOSIS — Z951 Presence of aortocoronary bypass graft: Secondary | ICD-10-CM | POA: Diagnosis not present

## 2022-11-19 DIAGNOSIS — E079 Disorder of thyroid, unspecified: Secondary | ICD-10-CM | POA: Diagnosis not present

## 2022-11-19 DIAGNOSIS — K8689 Other specified diseases of pancreas: Secondary | ICD-10-CM | POA: Diagnosis not present

## 2022-11-19 DIAGNOSIS — Z7982 Long term (current) use of aspirin: Secondary | ICD-10-CM | POA: Diagnosis not present

## 2022-11-19 DIAGNOSIS — R10817 Generalized abdominal tenderness: Secondary | ICD-10-CM | POA: Diagnosis not present

## 2022-11-19 DIAGNOSIS — E875 Hyperkalemia: Secondary | ICD-10-CM | POA: Diagnosis not present

## 2022-11-19 DIAGNOSIS — R112 Nausea with vomiting, unspecified: Secondary | ICD-10-CM | POA: Diagnosis not present

## 2022-11-19 DIAGNOSIS — I70229 Atherosclerosis of native arteries of extremities with rest pain, unspecified extremity: Secondary | ICD-10-CM | POA: Diagnosis not present

## 2022-11-19 DIAGNOSIS — B348 Other viral infections of unspecified site: Secondary | ICD-10-CM | POA: Diagnosis not present

## 2022-11-19 DIAGNOSIS — E1165 Type 2 diabetes mellitus with hyperglycemia: Secondary | ICD-10-CM | POA: Diagnosis not present

## 2022-11-20 DIAGNOSIS — N184 Chronic kidney disease, stage 4 (severe): Secondary | ICD-10-CM | POA: Diagnosis not present

## 2022-11-20 DIAGNOSIS — E1151 Type 2 diabetes mellitus with diabetic peripheral angiopathy without gangrene: Secondary | ICD-10-CM | POA: Diagnosis not present

## 2022-11-20 DIAGNOSIS — K529 Noninfective gastroenteritis and colitis, unspecified: Secondary | ICD-10-CM | POA: Diagnosis not present

## 2022-11-20 DIAGNOSIS — I509 Heart failure, unspecified: Secondary | ICD-10-CM | POA: Diagnosis not present

## 2022-11-20 DIAGNOSIS — K8681 Exocrine pancreatic insufficiency: Secondary | ICD-10-CM | POA: Diagnosis not present

## 2022-11-20 DIAGNOSIS — Z9081 Acquired absence of spleen: Secondary | ICD-10-CM | POA: Diagnosis not present

## 2022-11-20 DIAGNOSIS — E1165 Type 2 diabetes mellitus with hyperglycemia: Secondary | ICD-10-CM | POA: Diagnosis not present

## 2022-11-20 DIAGNOSIS — I2510 Atherosclerotic heart disease of native coronary artery without angina pectoris: Secondary | ICD-10-CM | POA: Diagnosis not present

## 2022-11-20 DIAGNOSIS — E782 Mixed hyperlipidemia: Secondary | ICD-10-CM | POA: Diagnosis not present

## 2022-11-20 DIAGNOSIS — R778 Other specified abnormalities of plasma proteins: Secondary | ICD-10-CM | POA: Diagnosis not present

## 2022-11-20 DIAGNOSIS — E039 Hypothyroidism, unspecified: Secondary | ICD-10-CM | POA: Diagnosis not present

## 2022-11-21 ENCOUNTER — Telehealth: Payer: Self-pay

## 2022-11-21 NOTE — Telephone Encounter (Signed)
Pt's daughter Butch Penny called requesting assistance with high blood sugars. Blood sugar since getting out the hospital has been in the high 400's.  Contacted pt and he advised he is taking 8 units of Basaglar at night and 6 units of Novolog before his meals. Pt checked blood sugar on the phone and it was reading HI which is over 500. Pt had half a egg sandwich and a boost shake for lunch.

## 2022-11-21 NOTE — Telephone Encounter (Signed)
Pt contacted and advised  Lets increase the doses as follows-and can continue to adjust them if needed: - Basaglar: 12 units - Novolog: 8-10 units -15 minutes before meals He can also use the following NovoLog sliding scale for correction: - 150-175: + 1 unit - 176-200: + 2 units - 201-225: + 3 units - 226-250: + 4 units - >250: + 5 units Please let us know how the sugars are doing in 2 to 3 days.

## 2022-11-25 ENCOUNTER — Encounter: Payer: Self-pay | Admitting: Internal Medicine

## 2022-11-25 DIAGNOSIS — D631 Anemia in chronic kidney disease: Secondary | ICD-10-CM | POA: Diagnosis not present

## 2022-11-25 DIAGNOSIS — N184 Chronic kidney disease, stage 4 (severe): Secondary | ICD-10-CM | POA: Diagnosis not present

## 2022-11-26 ENCOUNTER — Ambulatory Visit (INDEPENDENT_AMBULATORY_CARE_PROVIDER_SITE_OTHER): Payer: Medicare HMO | Admitting: Internal Medicine

## 2022-11-26 ENCOUNTER — Encounter: Payer: Self-pay | Admitting: Internal Medicine

## 2022-11-26 VITALS — BP 148/60 | HR 69 | Temp 97.4°F | Resp 16 | Ht 70.0 in | Wt 136.1 lb

## 2022-11-26 DIAGNOSIS — N189 Chronic kidney disease, unspecified: Secondary | ICD-10-CM | POA: Diagnosis not present

## 2022-11-26 DIAGNOSIS — R634 Abnormal weight loss: Secondary | ICD-10-CM | POA: Diagnosis not present

## 2022-11-26 DIAGNOSIS — Z794 Long term (current) use of insulin: Secondary | ICD-10-CM

## 2022-11-26 DIAGNOSIS — A0839 Other viral enteritis: Secondary | ICD-10-CM | POA: Diagnosis not present

## 2022-11-26 DIAGNOSIS — E1151 Type 2 diabetes mellitus with diabetic peripheral angiopathy without gangrene: Secondary | ICD-10-CM | POA: Diagnosis not present

## 2022-11-26 DIAGNOSIS — E038 Other specified hypothyroidism: Secondary | ICD-10-CM

## 2022-11-26 LAB — TSH: TSH: 3.22 u[IU]/mL (ref 0.35–5.50)

## 2022-11-26 LAB — BASIC METABOLIC PANEL
BUN: 36 mg/dL — ABNORMAL HIGH (ref 6–23)
CO2: 25 mEq/L (ref 19–32)
Calcium: 8.6 mg/dL (ref 8.4–10.5)
Chloride: 100 mEq/L (ref 96–112)
Creatinine, Ser: 2.25 mg/dL — ABNORMAL HIGH (ref 0.40–1.50)
GFR: 25.79 mL/min — ABNORMAL LOW (ref >60.00)
Glucose, Bld: 85 mg/dL (ref 70–99)
Potassium: 4.6 mEq/L (ref 3.5–5.1)
Sodium: 134 mEq/L — ABNORMAL LOW (ref 135–145)

## 2022-11-26 MED ORDER — NOVOLOG FLEXPEN 100 UNIT/ML ~~LOC~~ SOPN: 10.0000 [IU] | PEN_INJECTOR | Freq: Three times a day (TID) | SUBCUTANEOUS | Status: DC

## 2022-11-26 MED ORDER — BASAGLAR KWIKPEN 100 UNIT/ML ~~LOC~~ SOPN: 12.0000 [IU] | PEN_INJECTOR | Freq: Every day | SUBCUTANEOUS | Status: DC

## 2022-11-26 NOTE — Progress Notes (Signed)
Subjective:    Patient ID: Ethan Mcneil, male    DOB: [birth year], 88 y.o.   MRN: [id number]  DOS:  11/26/2022 Type of visit - description: hospital f/u  Admitted to the hospital 11/19/2022: Had nausea, vomiting, abdominal pain.  CT: Mild wall thickening of the colon.  Working diagnosis was viral enterocolitis. DM: Blood sugars as high as 700, no DKA.  Received fluids and short acting insulin. Creatinine stable at 2.5  Troponin is slightly elevated likely from demand ischemia.  No concern for NSTEMI.  Last creatinine 2.4.  Sodium 130. Hemoglobin 11.  Wt Readings from Last 3 Encounters:  11/26/22 136 lb 2 oz (61.7 kg)  10/07/22 143 lb 2 oz (64.9 kg)  08/08/22 139 lb 6.4 oz (63.2 kg)    Review of Systems Since he left the hospital is "feeling better every day". No fever chills No nausea or vomiting. No diarrhea.  Never had blood in the stools.  Appetite is improving.  We review his ambulatory BPs and CBGs  Past Medical History:  Diagnosis Date   Anemia    Arthritis    CAD (coronary artery disease)    CABG 1997; grafts patent @ cath [facility] 11/09   DM2 (diabetes mellitus, type 2) ([place])    Gout    after tick bite   History of kidney stones    HLD (hyperlipidemia)    HTN (hypertension)    essential nos   Hypothyroid    Pancreatic lesion    Postsurgical aortocoronary bypass status    PVD (peripheral vascular disease) ([place])    S/P herniorrhaphy    Tick fever 2012   [facility] , [place]   Unspecified hyperplasia of prostate without urinary obstruction and other lower urinary tract symptoms (LUTS)    Urolithiasis 2015   hx of   Wears dentures    Wears glasses     Past Surgical History:  Procedure Laterality Date   BIOPSY  08/07/2020   Procedure: BIOPSY;  Surgeon: Irving Copas., MD;  Location: [place];  Service: Gastroenterology;;   CATARACT EXTRACTION Bilateral    CORONARY ARTERY BYPASS GRAFT     x7 vessels 1997; cath 2002 and 2009;  grafts patent    [place], URETEROSCOPY AND STENT PLACEMENT Right 02/21/2014   Procedure: CYSTOSCOPY WITH RIGHT  RETROGRADE PYELOGRAM,RIGHT  URETEROSCOPY WITH STONE BASKETING EXTRACTION;  Surgeon: Irine Seal, MD;  [facility];  Service: Urology;  Laterality: Right;   ESOPHAGOGASTRODUODENOSCOPY (EGD) WITH PROPOFOL N/A 08/07/2020   Procedure: ESOPHAGOGASTRODUODENOSCOPY (EGD) WITH PROPOFOL;  Surgeon: Rush Landmark Telford Nab., MD;  Location: [place];  Service: Gastroenterology;  Laterality: N/A;   EUS N/A 08/07/2020   Procedure: UPPER ENDOSCOPIC ULTRASOUND (EUS) LINEAR;  Surgeon: Irving Copas., MD;  Location: [place];  Service: Gastroenterology;  Laterality: N/A;   FINE NEEDLE ASPIRATION N/A 08/07/2020   Procedure: FINE NEEDLE ASPIRATION (FNA) LINEAR;  Surgeon: Irving Copas., MD;  Location: [place];  Service: Gastroenterology;  Laterality: N/A;   HERNIA REPAIR     INGUINAL HERNIA REPAIR     MULTIPLE TOOTH EXTRACTIONS     SHOULDER SURGERY     SPLENECTOMY, TOTAL N/A 10/26/2020   Procedure: SPLENECTOMY;  Surgeon: Stark Klein, MD;  Location: [place];  Service: General;  Laterality: N/A;    Current Outpatient Medications  Medication Instructions   acetaminophen (TYLENOL) 500 mg, Oral, Every 4 hours PRN   albuterol (VENTOLIN HFA) 108 (90 Base) MCG/ACT inhaler 2 puffs, Inhalation, Every  6 hours PRN   amLODipine (NORVASC) 10 mg, Oral, Daily   aspirin EC 81 mg, Oral, Daily at bedtime   B-12 1,000 mcg, Oral, Daily   B-D UF III MINI PEN NEEDLES 31G X 5 MM MISC USE 3 TIMES A DAY AS DIRECTED   Basaglar KwikPen 7-8 Units, Subcutaneous, Daily at bedtime   Continuous Blood Gluc Receiver (FREESTYLE LIBRE 2 READER) DEVI Use as instructed to check blood sugar   Continuous Blood Gluc Sensor (FREESTYLE LIBRE 14 DAY SENSOR) MISC USE 1 SENSOR EVERY 14 (FOURTEEN) DAYS.   FREESTYLE LITE test strip CHECK BLOOD SUGAR NO MORE THAN TWICE DAILY.    hydrALAZINE (APRESOLINE) 100 mg, Oral, 3 times daily   insulin aspart (NOVOLOG FLEXPEN) 100 UNIT/ML FlexPen 3 times a day (just before each meal), 5-6-6 units.   isosorbide mononitrate (IMDUR) 60 mg, Oral, Daily   levothyroxine (SYNTHROID) 150 mcg, Oral, Daily before breakfast   metoprolol succinate (TOPROL-XL) 25 mg, Oral, Daily   nitroGLYCERIN (NITROSTAT) 0.4 mg, Sublingual, Every 5 min x3 PRN   omeprazole (PRILOSEC) 40 mg, Oral, Daily   Pancrelipase, Lip-Prot-Amyl, (ZENPEP) 25000-79000 units CPEP Take 2 capsules by mouth with every meal and snacks   simvastatin (ZOCOR) 20 MG tablet TAKE 1 TABLET BY MOUTH EVERYDAY AT BEDTIME   sodium bicarbonate 650 mg, Oral, 2 times daily   torsemide (DEMADEX) 40 mg, Oral, Daily   Vitamin D3 2,000 Units, Oral, Daily       Objective:   Physical Exam BP (!) 152/64   Pulse 69   Temp (!) 97.4 F (36.3 C) (Oral)   Resp 16   Ht '5\' 10"'$  (1.778 m)   Wt 136 lb 2 oz (61.7 kg)   SpO2 96%   BMI 19.53 kg/m  General:   Well developed, NAD, BMI noted.  HEENT:  Normocephalic . Face symmetric, atraumatic Lungs:  CTA B Normal respiratory effort, no intercostal retractions, no accessory muscle use. Heart: RRR,  no murmur.  Abdomen:  Not distended, soft, non-tender. No rebound or rigidity.   Skin: Not pale. Not jaundice Lower extremities: no pretibial edema bilaterally  Neurologic:  alert & oriented X3.  Speech normal, gait appropriate for age and unassisted Psych--  Cognition and judgment appear intact.  Cooperative with normal attention span and concentration.  Behavior appropriate. No anxious or depressed appearing.     Assessment     Assessment DM -- w/ CAD, no neuropathy, intolerant to metformin , sees ENDO HTN Hyperkalemia-- off  ACEs Hyperlipidemia Hypothyroidism DJD BPH, LUTS,h/o urolithiasis -- sees urology CKD: started to see renal 06/2020 Anemia  CV: --CAD, CABG 1997, cardiac cath 2009 --PVD (no ABIs in the chart) --L leg  edema, chronic, (-) DVT per [place] 2015 -- CHF Gout GI: Pancreatic insufficiency dx 02-2017 B 12 deficiency, mild 2015  Wt loss:  2012 187 lb, 2014 173 lb , 2015 167 01-2017: EGD normal, BX chronic active gastritis. Colonoscopy normal. Saw GI 02-2017: likely d/t Pancreatic insufficiency and poorly controlled diabetes. 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  Surgeries 10-2020 Asplenia   PLAN: Enterocolitis: Admitted with GI symptoms, CT abdomen showed thickening of the colon, hospital team felt it was due to enterocolitis and a viral panel show + enterovirus.  At this point he is much improved, do not believe further evaluation is needed.  Continue good hydration and call if symptoms return.  DM: CBG was elevated to the hospital, up to 700, received short-acting insulin, discharge or increased loss of Lantus: 10 units nightly, continue lispro 7 units 3 times daily.  Subsequently talked with endocrinology and currently is doing: Basaglar 12 units nightly, Novolin 10 units 3 times daily. CBG last night 147, CBG this morning 174. Further management per Endo CKD, HTN: At the last visit with renal 11/18/2022 torsemide was discontinued. At the recent hospital stay metoprolol stopped due to bradycardia. Currently on Imdur, hydralazine 100 mg 3 times daily, amlodipine 10 mg. BP today 152/64, recheck 148/60. Plan: Restart metoprolol If BP is more than 145/85 consistently. Weight loss: Significant weight loss, probably due to intercurrent infection.  Reassess on RTC Hypothyroidism: Recheck TSH RTC as scheduled 02/04/2023.

## 2022-11-26 NOTE — Patient Instructions (Signed)
Continue other medications as you are doing  Check the  blood pressure regularly BP GOAL is between 110/65 and  145/85. If it is consistently higher or lower, let me know  Restart metoprolol only if your blood pressures are consistently more than 145/85.  GO TO THE LAB : Get the blood work    Your next appointment is in February.  Come back sooner if needed.

## 2022-11-26 NOTE — Assessment & Plan Note (Signed)
Enterocolitis: Admitted with GI symptoms, CT abdomen showed thickening of the colon, hospital team felt it was due to enterocolitis and a viral panel show + enterovirus.  At this point he is much improved, do not believe further evaluation is needed.  Continue good hydration and call if symptoms return. DM: CBG was elevated to the hospital, up to 700, received short-acting insulin, discharge or increased loss of Lantus: 10 units nightly, continue lispro 7 units 3 times daily.  Subsequently talked with endocrinology and currently is doing: Basaglar 12 units nightly, Novolin 10 units 3 times daily. CBG last night 147, CBG this morning 174. Further management per Endo CKD, HTN: At the last visit with renal 11/18/2022 torsemide was discontinued. At the recent hospital stay metoprolol stopped due to bradycardia. Currently on Imdur, hydralazine 100 mg 3 times daily, amlodipine 10 mg. BP today 152/64, recheck 148/60. Plan: Restart metoprolol If BP is more than 145/85 consistently. Weight loss: Significant weight loss, probably due to intercurrent infection.  Reassess on RTC Hypothyroidism: Recheck TSH RTC as scheduled 02/04/2023.

## 2022-12-05 ENCOUNTER — Other Ambulatory Visit: Payer: Self-pay | Admitting: Internal Medicine

## 2022-12-05 ENCOUNTER — Ambulatory Visit: Payer: Medicare HMO | Admitting: Cardiology

## 2022-12-11 ENCOUNTER — Encounter: Payer: Self-pay | Admitting: Internal Medicine

## 2022-12-11 ENCOUNTER — Ambulatory Visit: Payer: Medicare HMO | Admitting: Internal Medicine

## 2022-12-11 VITALS — BP 140/80 | HR 67 | Ht 70.0 in | Wt 150.4 lb

## 2022-12-11 DIAGNOSIS — E1169 Type 2 diabetes mellitus with other specified complication: Secondary | ICD-10-CM

## 2022-12-11 DIAGNOSIS — N184 Chronic kidney disease, stage 4 (severe): Secondary | ICD-10-CM | POA: Diagnosis not present

## 2022-12-11 DIAGNOSIS — E1159 Type 2 diabetes mellitus with other circulatory complications: Secondary | ICD-10-CM | POA: Diagnosis not present

## 2022-12-11 DIAGNOSIS — E1165 Type 2 diabetes mellitus with hyperglycemia: Secondary | ICD-10-CM | POA: Diagnosis not present

## 2022-12-11 DIAGNOSIS — D631 Anemia in chronic kidney disease: Secondary | ICD-10-CM | POA: Diagnosis not present

## 2022-12-11 DIAGNOSIS — E785 Hyperlipidemia, unspecified: Secondary | ICD-10-CM

## 2022-12-11 LAB — POCT GLYCOSYLATED HEMOGLOBIN (HGB A1C): Hemoglobin A1C: 10.1 % — AB (ref 4.0–5.6)

## 2022-12-11 MED ORDER — FREESTYLE LIBRE 2 SENSOR MISC: 1.0000 | 3 refills | Status: DC

## 2022-12-11 MED ORDER — FREESTYLE LIBRE 2 READER DEVI: 0 refills | Status: DC

## 2022-12-11 MED ORDER — BASAGLAR KWIKPEN 100 UNIT/ML ~~LOC~~ SOPN: 10.0000 [IU] | PEN_INJECTOR | Freq: Every day | SUBCUTANEOUS | Status: DC

## 2022-12-11 NOTE — Patient Instructions (Addendum)
Please change: - Basaglar: 10 units and move it to am - Novolog: 6-8 units -15 minutes before meals He can also use the following NovoLog sliding scale for correction: - 150-175: + 1 unit - 176-200: + 2 units - 201-225: + 3 units - 226-250: + 4 units - >250: + 5 units  Let me know which supplier we can use for the CGM.  The most common suppliers for the continuous glucose monitor are: [place] [employer]: [employer]: [phone] Ext [place]: [place]: [place]: [phone] [place]: [phone]  Please return in 3 months with your sugar log.

## 2022-12-11 NOTE — Progress Notes (Signed)
Patient ID: Ethan Mcneil, male   DOB: 11-Jul-1934, 88 y.o.   MRN: [id number]  HPI: Ethan Mcneil is a 88 y.o.-year-old male, returning for follow-up for DM2, dx in 2013, insulin-dependent since 2017, uncontrolled, with complications (CAD, CHF, PVD, CKD, peripheral neuropathy). Pt. previously saw Dr. Loanne Drilling, but last visit with me 4 months ago.  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  He was recently admitted with viral gastroenteritis and very high blood sugars, in the 700s, but no DKA. He feels better -sugars improved slightly since then. No increased urination, blurry vision, nausea, chest pain.  Reviewed HbA1c: Lab Results  Component Value Date   HGBA1C 11.9 (A) 08/08/2022   HGBA1C 8.0 (H) 04/15/2022   HGBA1C 6.4 (A) 12/21/2021   HGBA1C 5.9 (A) 08/21/2021   HGBA1C 6.6 (A) 04/17/2021   HGBA1C 7.1 (A) 01/08/2021   HGBA1C 7.4 (A) 09/04/2020   HGBA1C 9.4 (A) 07/04/2020   HGBA1C 9.9 (A) 05/02/2020   HGBA1C 7.5 (A) 12/30/2019   Pt is on a regimen of: - Basaglar 7 units at bedtime >> off x2 weeks as he was not able to get a refill >> restarted  - now 12 units daily at night - Humalog 3x a day, before meals: 6-6-6 >> 8-10 units before meals  Pt checks his sugars 2x a day and they are: - am: HI now (before stopping insulin: 150s) >> 43-347 - 2h after b'fast: n/c - before lunch: n/c >> 48-260 - 2h after lunch: n/c - before dinner: n/c >> 68-292 - 2h after dinner: (before stopping insulin:110-120) >> 64, 143 - bedtime: n/c - nighttime: n/c Lowest sugar was 110 >> 43; he has hypoglycemia awareness at 70.  Highest sugar was HI >> 700s  Glucometer: Freestyle lite  Pt's meals are: - Breakfast: cereal or egg bisquit - Lunch: veggies - Dinner: tomato sandwich, hamburger - Snacks: no sodas; gatorade 0, milk 2%  - 3 glasses, 1 Ensure, fruit smoothie  - +  CKD, last BUN/creatinine:  Lab Results  Component Value Date   BUN 36 (H) 11/26/2022   BUN 49 (A) 11/11/2022   CREATININE 2.25 (H) 11/26/2022   CREATININE 2.6 (A) 11/11/2022  He is not on an ACE inhibitor/ARB.  - + HL; last set of lipids: Lab Results  Component Value Date   CHOL 116 04/22/2022   HDL 57 04/22/2022   LDLCALC 45 04/22/2022   TRIG 69 04/22/2022   CHOLHDL 2.0 04/22/2022  On Zocor 20 mg daily.  - last eye exam was in 04/2022. No DR reportedly.   - + numbness and tingling in his feet.  Last foot exam 08/21/2021. His feet are wrapped.  He also has a history of HTN, gout, anemia of chronic disease, nephrolithiasis, hypothyroidism. He is on levothyroxine 150 mcg daily: - in am - fasting - at least 1h from b'fast - no calcium - no iron - no multivitamins - no PPIs (only as needed)  Latest TSH levels reviewed: Lab Results  Component Value Date   TSH 3.22 11/26/2022   TSH 1.785 04/05/2022   TSH 2.20 12/27/2021   TSH 4.12 11/12/2021   TSH 9.19 (H) 11/02/2021   TSH 10.37 (H) 09/25/2021   TSH 3.20 05/11/2021   TSH 1.64 02/16/2021   TSH 4.74 (H) 01/04/2021   TSH 2.51 08/31/2020   ROS: + see HPI  Past Medical History:  Diagnosis Date   Anemia    Arthritis    CAD (coronary artery disease)    CABG 1997; grafts patent @ cath [facility] 11/09   DM2 (diabetes mellitus, type 2) ([place])    Gout    after tick bite   History of kidney stones    HLD (hyperlipidemia)    HTN (hypertension)    essential nos   Hypothyroid    Pancreatic lesion    Postsurgical aortocoronary bypass status    PVD (peripheral vascular disease) ([place])    S/P herniorrhaphy    Tick fever 2012   [facility] [place]   Unspecified hyperplasia of prostate without urinary obstruction and other lower urinary tract symptoms (LUTS)    Urolithiasis 2015   hx of   Wears dentures    Wears glasses    Past Surgical History:  Procedure Laterality Date   BIOPSY  08/07/2020   Procedure:  BIOPSY;  Surgeon: Irving Copas., MD;  Location: [place];  Service: Gastroenterology;;   CATARACT EXTRACTION Bilateral    CORONARY ARTERY BYPASS GRAFT     x7 vessels 1997; cath 2002 and 2009; grafts patent    [place], URETEROSCOPY AND STENT PLACEMENT Right 02/21/2014   Procedure: CYSTOSCOPY WITH RIGHT  RETROGRADE PYELOGRAM,RIGHT  URETEROSCOPY WITH STONE BASKETING EXTRACTION;  Surgeon: Irine Seal, MD;  Location: [facility];  Service: Urology;  Laterality: Right;   ESOPHAGOGASTRODUODENOSCOPY (EGD) WITH PROPOFOL N/A 08/07/2020   Procedure: ESOPHAGOGASTRODUODENOSCOPY (EGD) WITH PROPOFOL;  Surgeon: Rush Landmark Telford Nab., MD;  Location: [place];  Service: Gastroenterology;  Laterality: N/A;   EUS N/A 08/07/2020   Procedure: UPPER ENDOSCOPIC ULTRASOUND (EUS) LINEAR;  Surgeon: Irving Copas., MD;  Location: [place];  Service: Gastroenterology;  Laterality: N/A;   FINE NEEDLE ASPIRATION N/A 08/07/2020   Procedure: FINE NEEDLE ASPIRATION (FNA) LINEAR;  Surgeon: Irving Copas., MD;  Location: [place];  Service: Gastroenterology;  Laterality: N/A;   HERNIA REPAIR     INGUINAL HERNIA REPAIR     MULTIPLE TOOTH EXTRACTIONS     SHOULDER SURGERY     SPLENECTOMY, TOTAL N/A 10/26/2020   Procedure: SPLENECTOMY;  Surgeon: Stark Klein, MD;  [facility];  Service: General;  Laterality: N/A;   Social History   Socioeconomic History   Marital status: Married    Spouse name: Pat   Number of children: 4   Years of education: Not on file   Highest education level: Not on file  Occupational History   Occupation: retired, works few hours   Tobacco Use   Smoking status: Never   Smokeless tobacco: Never  Vaping Use   Vaping Use: Never used  Substance and Sexual Activity   Alcohol use: No   Drug use: No   Sexual activity: Not Currently  Other Topics Concern   Not on file  Social History Narrative   Lives alone   Drives       Patient Dealer; aviator and former race car driver   4 children (boys)      Social Determinants of Health   Financial Resource Strain: Low Risk  (07/16/2022)   Overall Financial Resource Strain (CARDIA)    Difficulty of Paying Living Expenses: Not hard at all  Food Insecurity: No Food Insecurity (09/09/2022)   Hunger Vital Sign    Worried About Running Out of Food in the Last Year: Never true    Ran Out of Food in the Last Year: Never true  Transportation Needs: No Transportation  Needs (09/09/2022)   PRAPARE - Hydrologist (Medical): No    Lack of Transportation (Non-Medical): No  Physical Activity: Insufficiently Active (07/16/2022)   Exercise Vital Sign    Days of Exercise per Week: 5 days    Minutes of Exercise per Session: 20 min  Stress: No Stress Concern Present (07/16/2022)   [place]    Feeling of Stress : Not at all  Social Connections: Moderately Isolated (07/16/2022)   Social Connection and Isolation Panel [NHANES]    Frequency of Communication with Friends and Family: More than three times a week    Frequency of Social Gatherings with Friends and Family: More than three times a week    Attends Religious Services: More than 4 times per year    Active Member of [employer] or Organizations: No    Attends Archivist Meetings: Never    Marital Status: Widowed  Intimate Partner Violence: Not At Risk (07/16/2022)   Humiliation, Afraid, Rape, and Kick questionnaire    Fear of Current or Ex-Partner: No    Emotionally Abused: No    Physically Abused: No    Sexually Abused: No   Current Outpatient Medications on File Prior to Visit  Medication Sig Dispense Refill   acetaminophen (TYLENOL) 500 MG tablet Take 500 mg by mouth every 4 (four) hours as needed for moderate pain.     albuterol (VENTOLIN HFA) 108 (90 Base) MCG/ACT inhaler Inhale 2 puffs into the lungs every 6 (six) hours as  needed for wheezing or shortness of breath. (Patient not taking: Reported on 06/14/2022) 8 g 2   amLODipine (NORVASC) 10 MG tablet Take 1 tablet (10 mg total) by mouth daily. 30 tablet 0   aspirin EC 81 MG tablet Take 81 mg by mouth at bedtime.     B-D UF III MINI PEN NEEDLES 31G X 5 MM MISC USE 3 TIMES A DAY AS DIRECTED 100 each 2   Cholecalciferol (VITAMIN D3) 50 MCG (2000 UT) capsule Take 2,000 Units by mouth daily.     Continuous Blood Gluc Receiver (FREESTYLE LIBRE 2 READER) DEVI Use as instructed to check blood sugar (Patient not taking: Reported on 08/08/2022) 1 each 0   Continuous Blood Gluc Sensor (FREESTYLE LIBRE 14 DAY SENSOR) MISC USE 1 SENSOR EVERY 14 (FOURTEEN) DAYS. (Patient not taking: Reported on 06/14/2022) 6 each 3   Cyanocobalamin (B-12) 1000 MCG TABS Take 1,000 mcg by mouth daily.     FREESTYLE LITE test strip CHECK BLOOD SUGAR NO MORE THAN TWICE DAILY. (Patient not taking: Reported on 10/07/2022) 200 strip 0   hydrALAZINE (APRESOLINE) 100 MG tablet Take 100 mg by mouth 3 (three) times daily.     insulin aspart (NOVOLOG FLEXPEN) 100 UNIT/ML FlexPen Inject 10 Units into the skin 3 (three) times daily with meals.     Insulin Glargine (BASAGLAR KWIKPEN) 100 UNIT/ML Inject 12 Units into the skin at bedtime.     isosorbide mononitrate (IMDUR) 60 MG 24 hr tablet Take 1 tablet (60 mg total) by mouth daily. 90 tablet 3   levothyroxine (SYNTHROID) 150 MCG tablet Take 1 tablet (150 mcg total) by mouth daily before breakfast. 90 tablet 1   metoprolol succinate (TOPROL-XL) 25 MG 24 hr tablet Take 1 tablet (25 mg total) by mouth daily. (Patient not taking: Reported on 11/26/2022) 30 tablet 0   nitroGLYCERIN (NITROSTAT) 0.4 MG SL tablet Place 1 tablet (0.4 mg total) under the  tongue every 5 (five) minutes x 3 doses as needed for chest pain. (Patient not taking: Reported on 10/07/2022) 25 tablet 2   omeprazole (PRILOSEC) 40 MG capsule Take 1 capsule (40 mg total) by mouth daily. 90 capsule 2    Pancrelipase, Lip-Prot-Amyl, (ZENPEP) 25000-79000 units CPEP Take 2 capsules by mouth with every meal and snacks (Patient taking differently: Take 1 capsule by mouth daily at 12 noon.) 540 capsule 1   simvastatin (ZOCOR) 20 MG tablet Take 1 tablet (20 mg total) by mouth at bedtime. 90 tablet 1   sodium bicarbonate 650 MG tablet Take 650 mg by mouth 2 (two) times daily.     No current facility-administered medications on file prior to visit.   Allergies  Allergen Reactions   Ciprofloxacin Itching   Codeine Nausea And Vomiting   Family History  Problem Relation Age of Onset   Lung cancer Father    Coronary artery disease Mother        stent   Diabetes Brother    Prostate cancer Maternal Uncle        in his 13s   Stroke Son        GF   Throat cancer Son    Colon cancer Neg Hx    Rectal cancer Neg Hx    Stomach cancer Neg Hx    Esophageal cancer Neg Hx    Pancreatic cancer Neg Hx    PE: BP (!) 140/80 (BP Location: Left Arm, Patient Position: Sitting, Cuff Size: Normal)   Pulse 67   Ht '5\' 10"'$  (1.778 m)   Wt 150 lb 6.4 oz (68.2 kg)   SpO2 95%   BMI 21.58 kg/m  Wt Readings from Last 3 Encounters:  12/11/22 150 lb 6.4 oz (68.2 kg)  11/26/22 136 lb 2 oz (61.7 kg)  10/07/22 143 lb 2 oz (64.9 kg)   Constitutional: under weight, in NAD Eyes: no exophthalmos ENT:  no thyromegaly, no cervical lymphadenopathy Cardiovascular: RRR, No MRG, + L>R LE edema Respiratory: CTA B Musculoskeletal: no deformities Skin: no rashes Neurological: no tremor with outstretched hands  ASSESSMENT: 1. DM2, insulin-dependent, uncontrolled, with complications - CAD, h/o CABG - CHF - PVD - CKD stage IV - Peripheral neuropathy - h/o hypoglycemia x1 in 2021  2. HL  3.  Hypothyroidism  PLAN:  1. Patient with longstanding, uncontrolled, insulin-dependent diabetes, on basal/bolus insulin regimen with suboptimal control.  At last visit, HbA1c was much higher, at 11.9%.  He was off his basal  insulin for the previous 2 weeks as he was not able to refill it.  We restarted this at a lower dose and I advised him how to titrate the dose up.  I also recommended to vary the dose of Humalog 15 minutes before each meal, as he was taking his fixed dose.  I gave him a list of suppliers to see if he can get a freestyle libre CGM.  He was drinking Ensure and I recommended to switch to Glucerna if absolutely needed after he finished his Ensure supply.  He previously lost a significant amount of weight, which could have been due to his diabetes worsening. -Since last visit, he was admitted with viral gastroenteritis and very high blood sugars.  We increased his basal and bolus insulin doses since last visit. -At today's visit, sugars appear to be very fluctuating, but improved in the last 10 days, even with sugars in the 40s.  He also has hyperglycemic spikes, but we discussed that we  first need to address the low blood sugars, and then work on the highs.  For now, I advised him to reduce the dose of Basaglar and move into the morning.  Also, will reduce the NovoLog dose and advised him how to vary the dose based on the size of the meal. -He was not able to start the CGM since last visit.  I sent the prescription to his pharmacy and advised him how to obtain it from a supplier, in case his insurance does not cover this under pharmacy benefits. - I suggested to:  Patient Instructions  Please change: - Basaglar: 10 units and move it to am - Novolog: 6-8 units -15 minutes before meals He can also use the following NovoLog sliding scale for correction: - 150-175: + 1 unit - 176-200: + 2 units - 201-225: + 3 units - 226-250: + 4 units - >250: + 5 units  Let me know which supplier we can use for the CGM.  The most common suppliers for the continuous glucose monitor are: [place] [employer]: [place]: [phone] Ext [phone] [employer]: [place]:  [place]: [phone] [place]: [phone]  Please return in 3 months with your sugar log.   - we checked his HbA1c: 10.1% (slightly lower) - advised to check sugars at different times of the day - 4x a day, rotating check times - advised for yearly eye exams >> he is UTD - return to clinic in 3 months   2. HL -Reviewed latest lipid panel from 04/2022: All fractions at goal Lab Results  Component Value Date   CHOL 116 04/22/2022   HDL 57 04/22/2022   LDLCALC 45 04/22/2022   TRIG 69 04/22/2022   CHOLHDL 2.0 04/22/2022  -He continues on Zocor 20 mg daily without side effects  3.  Hypothyroidism - latest thyroid labs reviewed with pt. >> normal: Lab Results  Component Value Date   TSH 3.22 11/26/2022  - he continues on LT4 150 mcg daily - pt feels good on this dose. - we discussed about taking the thyroid hormone every day, with water, >30 minutes before breakfast, separated by >4 hours from acid reflux medications, calcium, iron, multivitamins. Pt. is taking it correctly.  Philemon Kingdom, MD PhD [facility]

## 2022-12-12 DIAGNOSIS — D631 Anemia in chronic kidney disease: Secondary | ICD-10-CM | POA: Diagnosis not present

## 2022-12-12 DIAGNOSIS — N184 Chronic kidney disease, stage 4 (severe): Secondary | ICD-10-CM | POA: Diagnosis not present

## 2022-12-25 DIAGNOSIS — D631 Anemia in chronic kidney disease: Secondary | ICD-10-CM | POA: Diagnosis not present

## 2022-12-25 DIAGNOSIS — N184 Chronic kidney disease, stage 4 (severe): Secondary | ICD-10-CM | POA: Diagnosis not present

## 2023-01-02 ENCOUNTER — Other Ambulatory Visit: Payer: Self-pay | Admitting: Internal Medicine

## 2023-01-06 ENCOUNTER — Telehealth: Payer: Self-pay

## 2023-01-06 NOTE — Telephone Encounter (Signed)
Inbound call from DME supplier requesting form be completed and faxed with clinical notes. DME supplies ordered via [employer] through online portal.

## 2023-01-08 DIAGNOSIS — N184 Chronic kidney disease, stage 4 (severe): Secondary | ICD-10-CM | POA: Diagnosis not present

## 2023-01-08 DIAGNOSIS — D631 Anemia in chronic kidney disease: Secondary | ICD-10-CM | POA: Diagnosis not present

## 2023-01-22 DIAGNOSIS — N184 Chronic kidney disease, stage 4 (severe): Secondary | ICD-10-CM | POA: Diagnosis not present

## 2023-01-22 DIAGNOSIS — D631 Anemia in chronic kidney disease: Secondary | ICD-10-CM | POA: Diagnosis not present

## 2023-02-05 DIAGNOSIS — D631 Anemia in chronic kidney disease: Secondary | ICD-10-CM | POA: Diagnosis not present

## 2023-02-05 DIAGNOSIS — N184 Chronic kidney disease, stage 4 (severe): Secondary | ICD-10-CM | POA: Diagnosis not present

## 2023-02-07 ENCOUNTER — Ambulatory Visit (INDEPENDENT_AMBULATORY_CARE_PROVIDER_SITE_OTHER): Payer: Medicare HMO | Admitting: Internal Medicine

## 2023-02-07 ENCOUNTER — Encounter: Payer: Self-pay | Admitting: Internal Medicine

## 2023-02-07 VITALS — BP 126/84 | HR 62 | Temp 98.1°F | Resp 16 | Ht 70.0 in | Wt 139.4 lb

## 2023-02-07 DIAGNOSIS — D649 Anemia, unspecified: Secondary | ICD-10-CM | POA: Diagnosis not present

## 2023-02-07 DIAGNOSIS — I10 Essential (primary) hypertension: Secondary | ICD-10-CM | POA: Diagnosis not present

## 2023-02-07 DIAGNOSIS — E1151 Type 2 diabetes mellitus with diabetic peripheral angiopathy without gangrene: Secondary | ICD-10-CM | POA: Diagnosis not present

## 2023-02-07 DIAGNOSIS — R634 Abnormal weight loss: Secondary | ICD-10-CM

## 2023-02-07 DIAGNOSIS — E038 Other specified hypothyroidism: Secondary | ICD-10-CM | POA: Diagnosis not present

## 2023-02-07 DIAGNOSIS — Z794 Long term (current) use of insulin: Secondary | ICD-10-CM | POA: Diagnosis not present

## 2023-02-07 NOTE — Progress Notes (Signed)
Subjective:    Patient ID: Ethan Mcneil, male    DOB: 1934-02-26, 89 y.o.   MRN: [id number]  DOS:  02/07/2023 Type of visit - description: Follow-up, here with his son.  Since the last visit he is feeling good.  Has no major concerns.   Saw Endo, note reviewed. Specifically denies fever or chills. No nausea vomiting diarrhea.  No blood in the stools. Appetite is fair. Had a URI, better, still has mild occasional cough.  Wt Readings from Last 3 Encounters:  02/07/23 139 lb 6 oz (63.2 kg)  12/11/22 150 lb 6.4 oz (68.2 kg)  11/26/22 136 lb 2 oz (61.7 kg)     Review of Systems See above   Past Medical History:  Diagnosis Date   Anemia    Arthritis    CAD (coronary artery disease)    CABG 1997; grafts patent @ cath [facility] 11/09   DM2 (diabetes mellitus, type 2) ([place])    Gout    after tick bite   History of kidney stones    HLD (hyperlipidemia)    HTN (hypertension)    essential nos   Hypothyroid    Pancreatic lesion    Postsurgical aortocoronary bypass status    PVD (peripheral vascular disease) ([place])    S/P herniorrhaphy    Tick fever 2012   [facility] [place]   Unspecified hyperplasia of prostate without urinary obstruction and other lower urinary tract symptoms (LUTS)    Urolithiasis 2015   hx of   Wears dentures    Wears glasses     Past Surgical History:  Procedure Laterality Date   BIOPSY  08/07/2020   Procedure: BIOPSY;  Surgeon: Irving Copas., MD;  Location: [place];  Service: Gastroenterology;;   CATARACT EXTRACTION Bilateral    CORONARY ARTERY BYPASS GRAFT     x7 vessels 1997; cath 2002 and 2009; grafts patent    [place], URETEROSCOPY AND STENT PLACEMENT Right 02/21/2014   Procedure: CYSTOSCOPY WITH RIGHT  RETROGRADE PYELOGRAM,RIGHT  URETEROSCOPY WITH STONE BASKETING EXTRACTION;  Surgeon: Irine Seal, MD;  [facility];  Service: Urology;  Laterality: Right;   ESOPHAGOGASTRODUODENOSCOPY  (EGD) WITH PROPOFOL N/A 08/07/2020   Procedure: ESOPHAGOGASTRODUODENOSCOPY (EGD) WITH PROPOFOL;  Surgeon: Rush Landmark Telford Nab., MD;  Location: [place];  Service: Gastroenterology;  Laterality: N/A;   EUS N/A 08/07/2020   Procedure: UPPER ENDOSCOPIC ULTRASOUND (EUS) LINEAR;  Surgeon: Irving Copas., MD;  Location: [place];  Service: Gastroenterology;  Laterality: N/A;   FINE NEEDLE ASPIRATION N/A 08/07/2020   Procedure: FINE NEEDLE ASPIRATION (FNA) LINEAR;  Surgeon: Irving Copas., MD;  Location: [place];  Service: Gastroenterology;  Laterality: N/A;   HERNIA REPAIR     INGUINAL HERNIA REPAIR     MULTIPLE TOOTH EXTRACTIONS     SHOULDER SURGERY     SPLENECTOMY, TOTAL N/A 10/26/2020   Procedure: SPLENECTOMY;  Surgeon: Stark Klein, MD;  Location: [place];  Service: General;  Laterality: N/A;    Current Outpatient Medications  Medication Instructions   acetaminophen (TYLENOL) 500 mg, Oral, Every 4 hours PRN   albuterol (VENTOLIN HFA) 108 (90 Base) MCG/ACT inhaler 2 puffs, Inhalation, Every 6 hours PRN   amLODipine (NORVASC) 10 mg, Oral, Daily   aspirin EC 81 mg, Oral, Daily at bedtime   B-12 1,000 mcg, Oral, Daily   B-D UF III MINI PEN NEEDLES 31G X 5 MM MISC USE 3 TIMES A DAY AS DIRECTED   Basaglar KwikPen  10-12 Units, Subcutaneous, Daily   Continuous Blood Gluc Receiver (FREESTYLE LIBRE 2 READER) DEVI Use as instructed to check blood sugar   Continuous Blood Gluc Sensor (FREESTYLE LIBRE 2 SENSOR) MISC 1 each, Does not apply, Every 14 days   FREESTYLE LITE test strip CHECK BLOOD SUGAR NO MORE THAN TWICE DAILY.   hydrALAZINE (APRESOLINE) 100 mg, Oral, 3 times daily   isosorbide mononitrate (IMDUR) 60 mg, Oral, Daily   levothyroxine (SYNTHROID) 150 mcg, Oral, Daily before breakfast   metoprolol succinate (TOPROL-XL) 25 mg, Oral, Daily   nitroGLYCERIN (NITROSTAT) 0.4 mg, Sublingual, Every 5 min x3 PRN   NovoLOG FlexPen 10 Units, Subcutaneous, 3 times daily  with meals   omeprazole (PRILOSEC) 40 mg, Oral, Daily   Pancrelipase, Lip-Prot-Amyl, (ZENPEP) 25000-79000 units CPEP Take 2 capsules by mouth with every meal and snacks   simvastatin (ZOCOR) 20 mg, Oral, Daily at bedtime   sodium bicarbonate 650 mg, Oral, 2 times daily   Vitamin D3 2,000 Units, Oral, Daily       Objective:   Physical Exam BP 126/84   Pulse 62   Temp 98.1 F (36.7 C) (Oral)   Resp 16   Ht '5\' 10"'$  (1.778 m)   Wt 139 lb 6 oz (63.2 kg)   SpO2 94%   BMI 20.00 kg/m  General:   Well developed, NAD, underweight appearing  HEENT:  Normocephalic . Face symmetric, atraumatic Lungs:  CTA B Normal respiratory effort, no intercostal retractions, no accessory muscle use. Heart: RRR,  no murmur. Abdomen: Soft, nontender. Lower extremities: Mild edema, L leg.  Chronic Skin: Not pale. Not jaundice Neurologic:  alert & oriented X3.  Speech normal, gait appropriate for age and unassisted Psych--  Cognition and judgment appear intact.  Cooperative with normal attention span and concentration.  Behavior appropriate. No anxious or depressed appearing.      Assessment      Assessment DM -- w/ CAD, no neuropathy, intolerant to metformin , sees ENDO HTN Hyperkalemia-- off  ACEs Hyperlipidemia Hypothyroidism DJD BPH, LUTS,h/o urolithiasis -- sees urology CKD: started to see renal 06/2020 Anemia  CV: --CAD, CABG 1997, cardiac cath 2009 --PVD (no ABIs in the chart) --L leg edema, chronic, (-) DVT per [place] 2015 -- CHF Gout GI: Pancreatic insufficiency dx 02-2017 B 12 deficiency, mild 2015  Wt loss:  2012 187 lb, 2014 173 lb , 2015 167 01-2017: EGD normal, BX chronic active gastritis. Colonoscopy normal. Saw GI 02-2017: likely d/t Pancreatic insufficiency and poorly controlled diabetes. 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  Surgeries  10-2020 Asplenia - PNM 23 : 2011; PNM 13: 11-2015.  PNM 20: 09-2021 -Meningococcal: #1 today 11-02-21, #2 02/2022. Boost q 5 years -meningococcal B: 02/2022 and 06-04-2022 - haemophilus influenza:   11-02-21. Completed   PLAN:  Routine checkup here with his son DM: Saw endocrinology 12/12/2019, A1c 10.1, currently on Basaglar and NovoLog sliding scale HTN: BP today 126/84, similar ambulatory BPs.  No change, continue present care Hyperlipidemia: On simvastatin, last FLP satisfactory Hypothyroidism: Good compliance with levothyroxine, last TSH good. CKD, anemia: Sees nephrology regularly, reports that has his blood checked every 2 weeks at the renal office regards anemia. Weight loss: When I saw him December 12 weight was 136 pounds, today is 139 pounds.  In the meantime, @ Endo weight was was 150 pounds, they use a different scale.  I think weight loss has stabilized. Preventive care reviewed Labs reviewed, not due for any labs today. RTC 3 to 4 months

## 2023-02-07 NOTE — Patient Instructions (Addendum)
Vaccines I recommend:  Covid booster RSV vaccine Shingrix (shingles)   Check the  blood pressure regularly BP GOAL is between 110/65 and  135/85. If it is consistently higher or lower, let me know     GO TO THE FRONT DESK, PLEASE SCHEDULE YOUR APPOINTMENTS Come back for   in  3-4 months

## 2023-02-08 NOTE — Assessment & Plan Note (Signed)
Routine checkup here with his son DM: Saw endocrinology 12/12/2019, A1c 10.1, currently on Basaglar and NovoLog sliding scale HTN: BP today 126/84, similar ambulatory BPs.  No change, continue present care Hyperlipidemia: On simvastatin, last FLP satisfactory Hypothyroidism: Good compliance with levothyroxine, last TSH good. CKD, anemia: Sees nephrology regularly, reports that has his blood checked every 2 weeks at the renal office regards anemia. Weight loss: When I saw him December 12 weight was 136 pounds, today is 139 pounds.  In the meantime, @ Endo weight was was 150 pounds, they use a different scale.  I think weight loss has stabilized. Preventive care reviewed Labs reviewed, not due for any labs today. RTC 3 to 4 months

## 2023-02-11 ENCOUNTER — Other Ambulatory Visit: Payer: Self-pay

## 2023-02-11 ENCOUNTER — Other Ambulatory Visit: Payer: Self-pay | Admitting: Internal Medicine

## 2023-02-11 MED ORDER — NOVOLOG FLEXPEN 100 UNIT/ML ~~LOC~~ SOPN: PEN_INJECTOR | SUBCUTANEOUS | 1 refills | Status: DC

## 2023-02-19 DIAGNOSIS — N184 Chronic kidney disease, stage 4 (severe): Secondary | ICD-10-CM | POA: Diagnosis not present

## 2023-02-19 DIAGNOSIS — D631 Anemia in chronic kidney disease: Secondary | ICD-10-CM | POA: Diagnosis not present

## 2023-03-04 DIAGNOSIS — N184 Chronic kidney disease, stage 4 (severe): Secondary | ICD-10-CM | POA: Diagnosis not present

## 2023-03-04 DIAGNOSIS — Z809 Family history of malignant neoplasm, unspecified: Secondary | ICD-10-CM | POA: Diagnosis not present

## 2023-03-04 DIAGNOSIS — I509 Heart failure, unspecified: Secondary | ICD-10-CM | POA: Diagnosis not present

## 2023-03-04 DIAGNOSIS — Z008 Encounter for other general examination: Secondary | ICD-10-CM | POA: Diagnosis not present

## 2023-03-04 DIAGNOSIS — Z794 Long term (current) use of insulin: Secondary | ICD-10-CM | POA: Diagnosis not present

## 2023-03-04 DIAGNOSIS — Z825 Family history of asthma and other chronic lower respiratory diseases: Secondary | ICD-10-CM | POA: Diagnosis not present

## 2023-03-04 DIAGNOSIS — E785 Hyperlipidemia, unspecified: Secondary | ICD-10-CM | POA: Diagnosis not present

## 2023-03-04 DIAGNOSIS — E039 Hypothyroidism, unspecified: Secondary | ICD-10-CM | POA: Diagnosis not present

## 2023-03-04 DIAGNOSIS — K8681 Exocrine pancreatic insufficiency: Secondary | ICD-10-CM | POA: Diagnosis not present

## 2023-03-04 DIAGNOSIS — N2581 Secondary hyperparathyroidism of renal origin: Secondary | ICD-10-CM | POA: Diagnosis not present

## 2023-03-04 DIAGNOSIS — K861 Other chronic pancreatitis: Secondary | ICD-10-CM | POA: Diagnosis not present

## 2023-03-04 DIAGNOSIS — I130 Hypertensive heart and chronic kidney disease with heart failure and stage 1 through stage 4 chronic kidney disease, or unspecified chronic kidney disease: Secondary | ICD-10-CM | POA: Diagnosis not present

## 2023-03-04 DIAGNOSIS — I25119 Atherosclerotic heart disease of native coronary artery with unspecified angina pectoris: Secondary | ICD-10-CM | POA: Diagnosis not present

## 2023-03-05 DIAGNOSIS — D631 Anemia in chronic kidney disease: Secondary | ICD-10-CM | POA: Diagnosis not present

## 2023-03-05 DIAGNOSIS — N184 Chronic kidney disease, stage 4 (severe): Secondary | ICD-10-CM | POA: Diagnosis not present

## 2023-03-12 ENCOUNTER — Ambulatory Visit: Payer: Medicare HMO | Admitting: Internal Medicine

## 2023-03-12 ENCOUNTER — Encounter: Payer: Self-pay | Admitting: Internal Medicine

## 2023-03-12 VITALS — BP 122/80 | HR 71 | Ht 70.0 in | Wt 152.3 lb

## 2023-03-12 DIAGNOSIS — E039 Hypothyroidism, unspecified: Secondary | ICD-10-CM | POA: Diagnosis not present

## 2023-03-12 DIAGNOSIS — E1165 Type 2 diabetes mellitus with hyperglycemia: Secondary | ICD-10-CM

## 2023-03-12 DIAGNOSIS — E785 Hyperlipidemia, unspecified: Secondary | ICD-10-CM

## 2023-03-12 DIAGNOSIS — E1159 Type 2 diabetes mellitus with other circulatory complications: Secondary | ICD-10-CM | POA: Diagnosis not present

## 2023-03-12 LAB — POCT GLYCOSYLATED HEMOGLOBIN (HGB A1C): Hemoglobin A1C: 6.6 % — AB (ref 4.0–5.6)

## 2023-03-12 NOTE — Patient Instructions (Addendum)
Please change: - Basaglar 8 units at bedtime - FiAsp 4-6 units right before the meals You can also use the following FiAsp sliding scale for correction: - 150-175: + 1 unit - 176-200: + 2 units - 201-225: + 3 units - 226-250: + 4 units - >250: + 5 units  Please return in 3 months.

## 2023-03-12 NOTE — Progress Notes (Signed)
Patient ID: Ethan Mcneil, male   DOB: 02-07-1934, 89 y.o.   MRN: [id number]  HPI: Ethan Mcneil is a 89 y.o.-year-old male, returning for follow-up for DM2, dx in 2013, insulin-dependent since 2017, uncontrolled, with complications (CAD, CHF, PVD, CKD, peripheral neuropathy). Pt. previously saw Dr. Loanne Drilling, but last visit with me 3 months ago.  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  No increased urination, blurry vision, nausea, chest pain.  Reviewed HbA1c: Lab Results  Component Value Date   HGBA1C 10.1 (A) 12/11/2022   HGBA1C 11.9 (A) 08/08/2022   HGBA1C 8.0 (H) 04/15/2022   HGBA1C 6.4 (A) 12/21/2021   HGBA1C 5.9 (A) 08/21/2021   HGBA1C 6.6 (A) 04/17/2021   HGBA1C 7.1 (A) 01/08/2021   HGBA1C 7.4 (A) 09/04/2020   HGBA1C 9.4 (A) 07/04/2020   HGBA1C 9.9 (A) 05/02/2020   At last visit he was on: - Basaglar 7 units at bedtime >> off x2 weeks as he was not able to get a refill >> restarted 12 units daily at night - Humalog 3x a day, before meals: 6-6-6 >> 8-10 units before meals  I advised him to change to: - Basaglar: 10 units am - Novolog: 6-8 units >> 8 units 15 minutes before meals He can also use the following NovoLog sliding scale for correction: - 150-175: + 1 unit - 176-200: + 2 units - 201-225: + 3 units - 226-250: + 4 units - >250: + 5 units  Pt checks his sugars 4x a day and they are (libre 14 - 28$ per month from the pharmacy):  Prev.: - am: HI now (before stopping insulin: 150s) >> 43-347 - 2h after b'fast: n/c - before lunch: n/c >> 48-260 - 2h after lunch: n/c - before dinner: n/c >> 68-292 - 2h after dinner: (before stopping insulin:110-120) >> 64, 143 - bedtime: n/c - nighttime: n/c Lowest sugar was 110 >> 43 >> 50s; he has hypoglycemia awareness at 70.  Highest sugar was HI >> 700s >> 200s In 2023, he was admitted with  viral gastroenteritis and very high blood sugars, in the 700s, but no DKA.   Glucometer: Freestyle lite  Pt's meals are: - Breakfast: cereal or egg bisquit - Lunch: veggies - Dinner: tomato sandwich, hamburger - Snacks: no sodas; gatorade 0, milk 2%  - 3 glasses, 1 Ensure, fruit smoothie  - + CKD, last BUN/creatinine:  Lab Results  Component Value Date   BUN 36 (H) 11/26/2022   BUN 49 (A) 11/11/2022   CREATININE 2.25 (H) 11/26/2022   CREATININE 2.6 (A) 11/11/2022  He is not on an ACE inhibitor/ARB.  - + HL; last set of lipids: Lab Results  Component Value Date   CHOL 116 04/22/2022   HDL 57 04/22/2022   LDLCALC 45 04/22/2022   TRIG 69 04/22/2022   CHOLHDL 2.0 04/22/2022  On Zocor 20 mg daily.  - last eye exam was in 04/2022. No DR reportedly.   - + numbness and tingling in his feet.  Last foot exam 08/21/2021. He has significant swelling in the L>R leg and wears compression hoses.  He also has a history of HTN, gout, anemia of chronic disease, nephrolithiasis, hypothyroidism. He is on levothyroxine 150 mcg daily: - in am - fasting - at least 1h from b'fast - no calcium - no iron - no multivitamins - no PPIs (only as needed)  Latest  TSH levels reviewed: Lab Results  Component Value Date   TSH 3.22 11/26/2022   TSH 1.785 04/05/2022   TSH 2.20 12/27/2021   TSH 4.12 11/12/2021   TSH 9.19 (H) 11/02/2021   TSH 10.37 (H) 09/25/2021   TSH 3.20 05/11/2021   TSH 1.64 02/16/2021   TSH 4.74 (H) 01/04/2021   TSH 2.51 08/31/2020   ROS: + see HPI  Past Medical History:  Diagnosis Date   Anemia    Arthritis    CAD (coronary artery disease)    CABG 1997; grafts patent @ cath [facility] 11/09   DM2 (diabetes mellitus, type 2) ([place])    Gout    after tick bite   History of kidney stones    HLD (hyperlipidemia)    HTN (hypertension)    essential nos   Hypothyroid    Pancreatic lesion    Postsurgical aortocoronary bypass status    PVD (peripheral vascular disease)  ([place])    S/P herniorrhaphy    Tick fever 2012   [facility] [place]   Unspecified hyperplasia of prostate without urinary obstruction and other lower urinary tract symptoms (LUTS)    Urolithiasis 2015   hx of   Wears dentures    Wears glasses    Past Surgical History:  Procedure Laterality Date   BIOPSY  08/07/2020   Procedure: BIOPSY;  Surgeon: Irving Copas., MD;  Location: [place];  Service: Gastroenterology;;   CATARACT EXTRACTION Bilateral    CORONARY ARTERY BYPASS GRAFT     x7 vessels 1997; cath 2002 and 2009; grafts patent    [place], URETEROSCOPY AND STENT PLACEMENT Right 02/21/2014   Procedure: CYSTOSCOPY WITH RIGHT  RETROGRADE PYELOGRAM,RIGHT  URETEROSCOPY WITH STONE BASKETING EXTRACTION;  Surgeon: Irine Seal, MD;  Location: [facility];  Service: Urology;  Laterality: Right;   ESOPHAGOGASTRODUODENOSCOPY (EGD) WITH PROPOFOL N/A 08/07/2020   Procedure: ESOPHAGOGASTRODUODENOSCOPY (EGD) WITH PROPOFOL;  Surgeon: Rush Landmark Telford Nab., MD;  Location: [place];  Service: Gastroenterology;  Laterality: N/A;   EUS N/A 08/07/2020   Procedure: UPPER ENDOSCOPIC ULTRASOUND (EUS) LINEAR;  Surgeon: Irving Copas., MD;  Location: [place];  Service: Gastroenterology;  Laterality: N/A;   FINE NEEDLE ASPIRATION N/A 08/07/2020   Procedure: FINE NEEDLE ASPIRATION (FNA) LINEAR;  Surgeon: Irving Copas., MD;  Location: [place];  Service: Gastroenterology;  Laterality: N/A;   HERNIA REPAIR     INGUINAL HERNIA REPAIR     MULTIPLE TOOTH EXTRACTIONS     SHOULDER SURGERY     SPLENECTOMY, TOTAL N/A 10/26/2020   Procedure: SPLENECTOMY;  Surgeon: Stark Klein, MD;  Location: [facility];  Service: General;  Laterality: N/A;   Social History   Socioeconomic History   Marital status: Married    Spouse name: Pat   Number of children: 4   Years of education: Not on file   Highest education level: Not on file  Occupational  History   Occupation: retired, works few hours   Tobacco Use   Smoking status: Never   Smokeless tobacco: Never  Vaping Use   Vaping Use: Never used  Substance and Sexual Activity   Alcohol use: No   Drug use: No   Sexual activity: Not Currently  Other Topics Concern   Not on file  Social History Narrative   Lives alone   Drives      Patient Dealer; aviator and former race car driver   4 children (boys)      Social Determinants of Health  Financial Resource Strain: Low Risk  (07/16/2022)   Overall Financial Resource Strain (CARDIA)    Difficulty of Paying Living Expenses: Not hard at all  Food Insecurity: No Food Insecurity (09/09/2022)   Hunger Vital Sign    Worried About Running Out of Food in the Last Year: Never true    Ran Out of Food in the Last Year: Never true  Transportation Needs: No Transportation Needs (09/09/2022)   PRAPARE - Hydrologist (Medical): No    Lack of Transportation (Non-Medical): No  Physical Activity: Insufficiently Active (07/16/2022)   Exercise Vital Sign    Days of Exercise per Week: 5 days    Minutes of Exercise per Session: 20 min  Stress: No Stress Concern Present (07/16/2022)   [place]    Feeling of Stress : Not at all  Social Connections: Moderately Isolated (07/16/2022)   Social Connection and Isolation Panel [NHANES]    Frequency of Communication with Friends and Family: More than three times a week    Frequency of Social Gatherings with Friends and Family: More than three times a week    Attends Religious Services: More than 4 times per year    Active Member of [employer] or Organizations: No    Attends Archivist Meetings: Never    Marital Status: Widowed  Intimate Partner Violence: Not At Risk (07/16/2022)   Humiliation, Afraid, Rape, and Kick questionnaire    Fear of Current or Ex-Partner: No    Emotionally Abused: No    Physically  Abused: No    Sexually Abused: No   Current Outpatient Medications on File Prior to Visit  Medication Sig Dispense Refill   acetaminophen (TYLENOL) 500 MG tablet Take 500 mg by mouth every 4 (four) hours as needed for moderate pain.     albuterol (VENTOLIN HFA) 108 (90 Base) MCG/ACT inhaler Inhale 2 puffs into the lungs every 6 (six) hours as needed for wheezing or shortness of breath. (Patient not taking: Reported on 06/14/2022) 8 g 2   amLODipine (NORVASC) 10 MG tablet Take 1 tablet (10 mg total) by mouth daily. 90 tablet 1   aspirin EC 81 MG tablet Take 81 mg by mouth at bedtime.     B-D UF III MINI PEN NEEDLES 31G X 5 MM MISC USE 3 TIMES A DAY AS DIRECTED 100 each 2   Cholecalciferol (VITAMIN D3) 50 MCG (2000 UT) capsule Take 2,000 Units by mouth daily.     Continuous Blood Gluc Receiver (FREESTYLE LIBRE 2 READER) DEVI Use as instructed to check blood sugar 1 each 0   Continuous Blood Gluc Sensor (FREESTYLE LIBRE 2 SENSOR) MISC 1 each by Does not apply route every 14 (fourteen) days. 6 each 3   Cyanocobalamin (B-12) 1000 MCG TABS Take 1,000 mcg by mouth daily.     FREESTYLE LITE test strip CHECK BLOOD SUGAR NO MORE THAN TWICE DAILY. (Patient not taking: Reported on 10/07/2022) 200 strip 0   hydrALAZINE (APRESOLINE) 100 MG tablet Take 100 mg by mouth 3 (three) times daily.     insulin aspart (FIASP) 100 UNIT/ML FlexTouch Pen Inject 6-8 Units into the skin with breakfast, with lunch, and with evening meal. 15 mL 1   Insulin Glargine (BASAGLAR KWIKPEN) 100 UNIT/ML Inject 10-12 Units into the skin daily.     isosorbide mononitrate (IMDUR) 60 MG 24 hr tablet Take 1 tablet (60 mg total) by mouth daily. 90 tablet 3  levothyroxine (SYNTHROID) 150 MCG tablet Take 1 tablet (150 mcg total) by mouth daily before breakfast. 90 tablet 1   metoprolol succinate (TOPROL-XL) 25 MG 24 hr tablet Take 1 tablet (25 mg total) by mouth daily. (Patient not taking: Reported on 11/26/2022) 30 tablet 0   nitroGLYCERIN  (NITROSTAT) 0.4 MG SL tablet Place 1 tablet (0.4 mg total) under the tongue every 5 (five) minutes x 3 doses as needed for chest pain. (Patient not taking: Reported on 10/07/2022) 25 tablet 2   omeprazole (PRILOSEC) 40 MG capsule Take 1 capsule (40 mg total) by mouth daily. 90 capsule 2   Pancrelipase, Lip-Prot-Amyl, (ZENPEP) 25000-79000 units CPEP Take 2 capsules by mouth with every meal and snacks (Patient taking differently: Take 1 capsule by mouth daily at 12 noon.) 540 capsule 1   simvastatin (ZOCOR) 20 MG tablet Take 1 tablet (20 mg total) by mouth at bedtime. 90 tablet 1   sodium bicarbonate 650 MG tablet Take 650 mg by mouth 2 (two) times daily.     No current facility-administered medications on file prior to visit.   Allergies  Allergen Reactions   Ciprofloxacin Itching   Codeine Nausea And Vomiting   Family History  Problem Relation Age of Onset   Lung cancer Father    Coronary artery disease Mother        stent   Diabetes Brother    Prostate cancer Maternal Uncle        in his 32s   Stroke Son        GF   Throat cancer Son    Colon cancer Neg Hx    Rectal cancer Neg Hx    Stomach cancer Neg Hx    Esophageal cancer Neg Hx    Pancreatic cancer Neg Hx    PE: BP 122/80 (BP Location: Left Arm, Patient Position: Sitting, Cuff Size: Large)   Pulse 71   Ht 5\' 10"  (1.778 m)   Wt 152 lb 4.8 oz (69.1 kg)   SpO2 94%   BMI 21.85 kg/m  Wt Readings from Last 3 Encounters:  03/12/23 152 lb 4.8 oz (69.1 kg)  02/07/23 139 lb 6 oz (63.2 kg)  12/11/22 150 lb 6.4 oz (68.2 kg)   Constitutional: under weight, in NAD Eyes: no exophthalmos ENT:  no thyromegaly, no cervical lymphadenopathy Cardiovascular: RRR, No MRG, + L>R LE edema - wears compression hoses  Respiratory: CTA B Musculoskeletal: no deformities Skin: no rashes Neurological: no tremor with outstretched hands  ASSESSMENT: 1. DM2, insulin-dependent, uncontrolled, with complications - CAD, h/o CABG - CHF - PVD -  CKD stage IV - Peripheral neuropathy - h/o hypoglycemia x1 in 2021  2. HL  3.  Hypothyroidism  PLAN:  1. Patient with longstanding, uncontrolled, insulin-dependent diabetes, on basal/bolus insulin regimen, with suboptimal control.  At last visit, HbA1c was slightly better, at 10.1%, but still above target.  Sugars were very fluctuating, but improved in the 10 days prior to the visit, with an occasional drops in the 40s.  She also had hyperglycemic spikes but we discussed about first addressing his lows before trying to reduce his high blood sugars.  I advised him to reduce the dose of Basaglar and move it to the morning.  We also reduced the dose of NovoLog and advised him to vary the dose based on the size and consistency of the meal.  As he was not able to obtain a CGM from the pharmacy, I advised him to try to obtain it.  CGM interpretation: -At today's visit, we reviewed his CGM downloads: It appears that 70% of values are in target range (goal >70%), while 10% are higher than 180 (goal <25%), and 20% are lower than 70 (goal <4%).  The calculated average blood sugar is 113.  The projected HbA1c for the next 3 months (GMI) is 6.0%. -Reviewing the CGM trends, sugars are spectacularly decreased since last visit.  They are fluctuating at the lower limit of the target range and even lower, at 50s and lower during the day and increase gradually overnight, peaking between 7 and 8 AM.  We discussed that this is a sign of not enough basal insulin at night and too much basal/bolus insulin during the day.  Therefore, I advised him to decrease the dose of Basaglar and will be back at night.  At next visit, we may need to split the dose.  I also advised him to decrease the dose of Fiasp with each meal and to take it at the start of the meal, since he currently takes it 15 minutes before - I suggested to:  Patient Instructions  Please change: - Basaglar 8 units at bedtime - FiAsp 4-6 units right before the  meals You can also use the following FiAsp sliding scale for correction: - 150-175: + 1 unit - 176-200: + 2 units - 201-225: + 3 units - 226-250: + 4 units - >250: + 5 units  Please return in 3 months.   - we checked his HbA1c: 6.6% (lower) - advised to check sugars at different times of the day - 4x a day, rotating check times - advised for yearly eye exams >> he is UTD - at next visit, I advised him to not put his compression hoses before the visit so I can do the foot exam to bring them with him so he came to normal afterwards - return to clinic in 3-4 months  2. HL -Reviewed latest lipid panel from 04/2022: All fractions at goal: Lab Results  Component Value Date   CHOL 116 04/22/2022   HDL 57 04/22/2022   LDLCALC 45 04/22/2022   TRIG 69 04/22/2022   CHOLHDL 2.0 04/22/2022  -He continues on Zocor 20 mg daily without side effects  3.  Hypothyroidism - latest thyroid labs reviewed with pt. >> normal: Lab Results  Component Value Date   TSH 3.22 11/26/2022  - he continues on LT4 150 mcg daily - pt feels good on this dose. - we discussed about taking the thyroid hormone every day, with water, >30 minutes before breakfast, separated by >4 hours from acid reflux medications, calcium, iron, multivitamins. Pt. is taking it correctly.  Philemon Kingdom, MD PhD [facility]

## 2023-03-12 NOTE — Addendum Note (Signed)
Addended by: Jefferson Fuel on: 03/12/2023 10:27 AM   Modules accepted: Orders

## 2023-03-16 DIAGNOSIS — Z794 Long term (current) use of insulin: Secondary | ICD-10-CM | POA: Diagnosis not present

## 2023-03-16 DIAGNOSIS — E1151 Type 2 diabetes mellitus with diabetic peripheral angiopathy without gangrene: Secondary | ICD-10-CM | POA: Diagnosis not present

## 2023-03-16 DIAGNOSIS — I130 Hypertensive heart and chronic kidney disease with heart failure and stage 1 through stage 4 chronic kidney disease, or unspecified chronic kidney disease: Secondary | ICD-10-CM | POA: Diagnosis not present

## 2023-03-16 DIAGNOSIS — Z79890 Hormone replacement therapy (postmenopausal): Secondary | ICD-10-CM | POA: Diagnosis not present

## 2023-03-16 DIAGNOSIS — Z79899 Other long term (current) drug therapy: Secondary | ICD-10-CM | POA: Diagnosis not present

## 2023-03-16 DIAGNOSIS — E7800 Pure hypercholesterolemia, unspecified: Secondary | ICD-10-CM | POA: Diagnosis not present

## 2023-03-16 DIAGNOSIS — J811 Chronic pulmonary edema: Secondary | ICD-10-CM | POA: Diagnosis not present

## 2023-03-16 DIAGNOSIS — N184 Chronic kidney disease, stage 4 (severe): Secondary | ICD-10-CM | POA: Diagnosis not present

## 2023-03-16 DIAGNOSIS — E1122 Type 2 diabetes mellitus with diabetic chronic kidney disease: Secondary | ICD-10-CM | POA: Diagnosis not present

## 2023-03-16 DIAGNOSIS — I509 Heart failure, unspecified: Secondary | ICD-10-CM | POA: Diagnosis not present

## 2023-03-16 DIAGNOSIS — J90 Pleural effusion, not elsewhere classified: Secondary | ICD-10-CM | POA: Diagnosis not present

## 2023-03-16 DIAGNOSIS — Z951 Presence of aortocoronary bypass graft: Secondary | ICD-10-CM | POA: Diagnosis not present

## 2023-03-16 DIAGNOSIS — R0789 Other chest pain: Secondary | ICD-10-CM | POA: Diagnosis not present

## 2023-03-16 DIAGNOSIS — Z881 Allergy status to other antibiotic agents status: Secondary | ICD-10-CM | POA: Diagnosis not present

## 2023-03-16 DIAGNOSIS — I2510 Atherosclerotic heart disease of native coronary artery without angina pectoris: Secondary | ICD-10-CM | POA: Diagnosis not present

## 2023-03-16 DIAGNOSIS — R079 Chest pain, unspecified: Secondary | ICD-10-CM | POA: Diagnosis not present

## 2023-03-16 DIAGNOSIS — R0602 Shortness of breath: Secondary | ICD-10-CM | POA: Diagnosis not present

## 2023-03-16 DIAGNOSIS — Z885 Allergy status to narcotic agent status: Secondary | ICD-10-CM | POA: Diagnosis not present

## 2023-03-16 DIAGNOSIS — I214 Non-ST elevation (NSTEMI) myocardial infarction: Secondary | ICD-10-CM | POA: Diagnosis not present

## 2023-03-16 DIAGNOSIS — Z7982 Long term (current) use of aspirin: Secondary | ICD-10-CM | POA: Diagnosis not present

## 2023-03-16 DIAGNOSIS — M7989 Other specified soft tissue disorders: Secondary | ICD-10-CM | POA: Diagnosis not present

## 2023-03-16 DIAGNOSIS — E039 Hypothyroidism, unspecified: Secondary | ICD-10-CM | POA: Diagnosis not present

## 2023-03-17 DIAGNOSIS — I2489 Other forms of acute ischemic heart disease: Secondary | ICD-10-CM | POA: Diagnosis not present

## 2023-03-17 DIAGNOSIS — M7989 Other specified soft tissue disorders: Secondary | ICD-10-CM | POA: Diagnosis not present

## 2023-03-17 DIAGNOSIS — Z7982 Long term (current) use of aspirin: Secondary | ICD-10-CM | POA: Diagnosis not present

## 2023-03-17 DIAGNOSIS — E875 Hyperkalemia: Secondary | ICD-10-CM | POA: Diagnosis not present

## 2023-03-17 DIAGNOSIS — Z79899 Other long term (current) drug therapy: Secondary | ICD-10-CM | POA: Diagnosis not present

## 2023-03-17 DIAGNOSIS — N189 Chronic kidney disease, unspecified: Secondary | ICD-10-CM | POA: Diagnosis not present

## 2023-03-17 DIAGNOSIS — I342 Nonrheumatic mitral (valve) stenosis: Secondary | ICD-10-CM | POA: Diagnosis not present

## 2023-03-17 DIAGNOSIS — I130 Hypertensive heart and chronic kidney disease with heart failure and stage 1 through stage 4 chronic kidney disease, or unspecified chronic kidney disease: Secondary | ICD-10-CM | POA: Diagnosis not present

## 2023-03-17 DIAGNOSIS — I5023 Acute on chronic systolic (congestive) heart failure: Secondary | ICD-10-CM | POA: Diagnosis not present

## 2023-03-17 DIAGNOSIS — I5033 Acute on chronic diastolic (congestive) heart failure: Secondary | ICD-10-CM | POA: Diagnosis not present

## 2023-03-17 DIAGNOSIS — N390 Urinary tract infection, site not specified: Secondary | ICD-10-CM | POA: Diagnosis not present

## 2023-03-17 DIAGNOSIS — Z951 Presence of aortocoronary bypass graft: Secondary | ICD-10-CM | POA: Diagnosis not present

## 2023-03-17 DIAGNOSIS — E872 Acidosis, unspecified: Secondary | ICD-10-CM | POA: Diagnosis not present

## 2023-03-17 DIAGNOSIS — D631 Anemia in chronic kidney disease: Secondary | ICD-10-CM | POA: Diagnosis not present

## 2023-03-17 DIAGNOSIS — Z881 Allergy status to other antibiotic agents status: Secondary | ICD-10-CM | POA: Diagnosis not present

## 2023-03-17 DIAGNOSIS — I509 Heart failure, unspecified: Secondary | ICD-10-CM | POA: Diagnosis not present

## 2023-03-17 DIAGNOSIS — E1151 Type 2 diabetes mellitus with diabetic peripheral angiopathy without gangrene: Secondary | ICD-10-CM | POA: Diagnosis not present

## 2023-03-17 DIAGNOSIS — E039 Hypothyroidism, unspecified: Secondary | ICD-10-CM | POA: Diagnosis not present

## 2023-03-17 DIAGNOSIS — E8770 Fluid overload, unspecified: Secondary | ICD-10-CM | POA: Diagnosis not present

## 2023-03-17 DIAGNOSIS — I5021 Acute systolic (congestive) heart failure: Secondary | ICD-10-CM | POA: Diagnosis not present

## 2023-03-17 DIAGNOSIS — Z794 Long term (current) use of insulin: Secondary | ICD-10-CM | POA: Diagnosis not present

## 2023-03-17 DIAGNOSIS — E1122 Type 2 diabetes mellitus with diabetic chronic kidney disease: Secondary | ICD-10-CM | POA: Diagnosis not present

## 2023-03-17 DIAGNOSIS — R9431 Abnormal electrocardiogram [ECG] [EKG]: Secondary | ICD-10-CM | POA: Diagnosis not present

## 2023-03-17 DIAGNOSIS — E871 Hypo-osmolality and hyponatremia: Secondary | ICD-10-CM | POA: Diagnosis not present

## 2023-03-17 DIAGNOSIS — R0489 Hemorrhage from other sites in respiratory passages: Secondary | ICD-10-CM | POA: Diagnosis not present

## 2023-03-17 DIAGNOSIS — R410 Disorientation, unspecified: Secondary | ICD-10-CM | POA: Diagnosis not present

## 2023-03-17 DIAGNOSIS — I340 Nonrheumatic mitral (valve) insufficiency: Secondary | ICD-10-CM | POA: Diagnosis not present

## 2023-03-17 DIAGNOSIS — I2510 Atherosclerotic heart disease of native coronary artery without angina pectoris: Secondary | ICD-10-CM | POA: Diagnosis not present

## 2023-03-17 DIAGNOSIS — E782 Mixed hyperlipidemia: Secondary | ICD-10-CM | POA: Diagnosis not present

## 2023-03-17 DIAGNOSIS — Z515 Encounter for palliative care: Secondary | ICD-10-CM | POA: Diagnosis not present

## 2023-03-17 DIAGNOSIS — Z79890 Hormone replacement therapy (postmenopausal): Secondary | ICD-10-CM | POA: Diagnosis not present

## 2023-03-17 DIAGNOSIS — I371 Nonrheumatic pulmonary valve insufficiency: Secondary | ICD-10-CM | POA: Diagnosis not present

## 2023-03-17 DIAGNOSIS — K8689 Other specified diseases of pancreas: Secondary | ICD-10-CM | POA: Diagnosis not present

## 2023-03-17 DIAGNOSIS — R0602 Shortness of breath: Secondary | ICD-10-CM | POA: Diagnosis not present

## 2023-03-17 DIAGNOSIS — N401 Enlarged prostate with lower urinary tract symptoms: Secondary | ICD-10-CM | POA: Diagnosis not present

## 2023-03-17 DIAGNOSIS — R079 Chest pain, unspecified: Secondary | ICD-10-CM | POA: Diagnosis not present

## 2023-03-17 DIAGNOSIS — J811 Chronic pulmonary edema: Secondary | ICD-10-CM | POA: Diagnosis not present

## 2023-03-17 DIAGNOSIS — E7800 Pure hypercholesterolemia, unspecified: Secondary | ICD-10-CM | POA: Diagnosis not present

## 2023-03-17 DIAGNOSIS — Z66 Do not resuscitate: Secondary | ICD-10-CM | POA: Diagnosis not present

## 2023-03-17 DIAGNOSIS — D72829 Elevated white blood cell count, unspecified: Secondary | ICD-10-CM | POA: Diagnosis not present

## 2023-03-17 DIAGNOSIS — J9601 Acute respiratory failure with hypoxia: Secondary | ICD-10-CM | POA: Diagnosis not present

## 2023-03-17 DIAGNOSIS — I214 Non-ST elevation (NSTEMI) myocardial infarction: Secondary | ICD-10-CM | POA: Diagnosis not present

## 2023-03-17 DIAGNOSIS — Z885 Allergy status to narcotic agent status: Secondary | ICD-10-CM | POA: Diagnosis not present

## 2023-03-17 DIAGNOSIS — R0902 Hypoxemia: Secondary | ICD-10-CM | POA: Diagnosis not present

## 2023-03-17 DIAGNOSIS — N184 Chronic kidney disease, stage 4 (severe): Secondary | ICD-10-CM | POA: Diagnosis not present

## 2023-03-17 DIAGNOSIS — I77819 Aortic ectasia, unspecified site: Secondary | ICD-10-CM | POA: Diagnosis not present

## 2023-03-17 DIAGNOSIS — J90 Pleural effusion, not elsewhere classified: Secondary | ICD-10-CM | POA: Diagnosis not present

## 2023-03-18 DIAGNOSIS — J9601 Acute respiratory failure with hypoxia: Secondary | ICD-10-CM | POA: Diagnosis not present

## 2023-03-18 DIAGNOSIS — N184 Chronic kidney disease, stage 4 (severe): Secondary | ICD-10-CM | POA: Diagnosis not present

## 2023-03-18 DIAGNOSIS — R9431 Abnormal electrocardiogram [ECG] [EKG]: Secondary | ICD-10-CM | POA: Diagnosis not present

## 2023-03-18 DIAGNOSIS — I5021 Acute systolic (congestive) heart failure: Secondary | ICD-10-CM | POA: Diagnosis not present

## 2023-03-18 DIAGNOSIS — E8770 Fluid overload, unspecified: Secondary | ICD-10-CM | POA: Diagnosis not present

## 2023-03-19 DIAGNOSIS — I214 Non-ST elevation (NSTEMI) myocardial infarction: Secondary | ICD-10-CM | POA: Diagnosis not present

## 2023-03-19 DIAGNOSIS — J811 Chronic pulmonary edema: Secondary | ICD-10-CM | POA: Diagnosis not present

## 2023-03-19 DIAGNOSIS — I5021 Acute systolic (congestive) heart failure: Secondary | ICD-10-CM | POA: Diagnosis not present

## 2023-03-19 DIAGNOSIS — R0902 Hypoxemia: Secondary | ICD-10-CM | POA: Diagnosis not present

## 2023-03-19 DIAGNOSIS — J90 Pleural effusion, not elsewhere classified: Secondary | ICD-10-CM | POA: Diagnosis not present

## 2023-03-19 DIAGNOSIS — E8770 Fluid overload, unspecified: Secondary | ICD-10-CM | POA: Diagnosis not present

## 2023-03-19 DIAGNOSIS — J9601 Acute respiratory failure with hypoxia: Secondary | ICD-10-CM | POA: Diagnosis not present

## 2023-03-20 DIAGNOSIS — I5021 Acute systolic (congestive) heart failure: Secondary | ICD-10-CM | POA: Diagnosis not present

## 2023-03-20 DIAGNOSIS — E8770 Fluid overload, unspecified: Secondary | ICD-10-CM | POA: Diagnosis not present

## 2023-03-20 DIAGNOSIS — J9601 Acute respiratory failure with hypoxia: Secondary | ICD-10-CM | POA: Diagnosis not present

## 2023-03-20 DIAGNOSIS — I214 Non-ST elevation (NSTEMI) myocardial infarction: Secondary | ICD-10-CM | POA: Diagnosis not present

## 2023-03-21 ENCOUNTER — Telehealth: Payer: Self-pay | Admitting: Cardiology

## 2023-03-21 DIAGNOSIS — Z515 Encounter for palliative care: Secondary | ICD-10-CM | POA: Diagnosis not present

## 2023-03-21 DIAGNOSIS — E8770 Fluid overload, unspecified: Secondary | ICD-10-CM | POA: Diagnosis not present

## 2023-03-21 DIAGNOSIS — N184 Chronic kidney disease, stage 4 (severe): Secondary | ICD-10-CM | POA: Diagnosis not present

## 2023-03-21 DIAGNOSIS — I5021 Acute systolic (congestive) heart failure: Secondary | ICD-10-CM | POA: Diagnosis not present

## 2023-03-21 DIAGNOSIS — I2489 Other forms of acute ischemic heart disease: Secondary | ICD-10-CM | POA: Diagnosis not present

## 2023-03-21 DIAGNOSIS — J9601 Acute respiratory failure with hypoxia: Secondary | ICD-10-CM | POA: Diagnosis not present

## 2023-03-21 DIAGNOSIS — R0489 Hemorrhage from other sites in respiratory passages: Secondary | ICD-10-CM | POA: Diagnosis not present

## 2023-03-21 DIAGNOSIS — R0602 Shortness of breath: Secondary | ICD-10-CM | POA: Diagnosis not present

## 2023-03-21 NOTE — Telephone Encounter (Signed)
Samara Deist with [facility] states the patient has been admitted with them and they are recommending ordering palliative care. They would like to know if we are able to initiate an order for palliative care since patient sees [facility] for cardiology. Please advise.

## 2023-03-21 NOTE — Telephone Encounter (Signed)
Spoke with Ethan Mcneil and state usually PCP initiates Palliative.  I advised I could send the request to our cardiologist, but She states she will call PCP first

## 2023-03-22 DIAGNOSIS — I2510 Atherosclerotic heart disease of native coronary artery without angina pectoris: Secondary | ICD-10-CM | POA: Diagnosis not present

## 2023-03-22 DIAGNOSIS — I5023 Acute on chronic systolic (congestive) heart failure: Secondary | ICD-10-CM | POA: Diagnosis not present

## 2023-03-22 DIAGNOSIS — N184 Chronic kidney disease, stage 4 (severe): Secondary | ICD-10-CM | POA: Diagnosis not present

## 2023-03-22 DIAGNOSIS — I130 Hypertensive heart and chronic kidney disease with heart failure and stage 1 through stage 4 chronic kidney disease, or unspecified chronic kidney disease: Secondary | ICD-10-CM | POA: Diagnosis not present

## 2023-03-22 DIAGNOSIS — R0902 Hypoxemia: Secondary | ICD-10-CM | POA: Diagnosis not present

## 2023-03-22 DIAGNOSIS — J9601 Acute respiratory failure with hypoxia: Secondary | ICD-10-CM | POA: Diagnosis not present

## 2023-03-22 DIAGNOSIS — R9431 Abnormal electrocardiogram [ECG] [EKG]: Secondary | ICD-10-CM | POA: Diagnosis not present

## 2023-03-23 DIAGNOSIS — J9601 Acute respiratory failure with hypoxia: Secondary | ICD-10-CM | POA: Diagnosis not present

## 2023-03-23 DIAGNOSIS — I5021 Acute systolic (congestive) heart failure: Secondary | ICD-10-CM | POA: Diagnosis not present

## 2023-03-23 DIAGNOSIS — N184 Chronic kidney disease, stage 4 (severe): Secondary | ICD-10-CM | POA: Diagnosis not present

## 2023-03-23 DIAGNOSIS — E8770 Fluid overload, unspecified: Secondary | ICD-10-CM | POA: Diagnosis not present

## 2023-03-24 DIAGNOSIS — I5021 Acute systolic (congestive) heart failure: Secondary | ICD-10-CM | POA: Diagnosis not present

## 2023-03-24 DIAGNOSIS — J9601 Acute respiratory failure with hypoxia: Secondary | ICD-10-CM | POA: Diagnosis not present

## 2023-03-24 DIAGNOSIS — E8770 Fluid overload, unspecified: Secondary | ICD-10-CM | POA: Diagnosis not present

## 2023-03-24 DIAGNOSIS — N184 Chronic kidney disease, stage 4 (severe): Secondary | ICD-10-CM | POA: Diagnosis not present

## 2023-03-25 DIAGNOSIS — J811 Chronic pulmonary edema: Secondary | ICD-10-CM | POA: Diagnosis not present

## 2023-03-25 DIAGNOSIS — E8770 Fluid overload, unspecified: Secondary | ICD-10-CM | POA: Diagnosis not present

## 2023-03-25 DIAGNOSIS — N184 Chronic kidney disease, stage 4 (severe): Secondary | ICD-10-CM | POA: Diagnosis not present

## 2023-03-25 DIAGNOSIS — J90 Pleural effusion, not elsewhere classified: Secondary | ICD-10-CM | POA: Diagnosis not present

## 2023-03-25 DIAGNOSIS — D72829 Elevated white blood cell count, unspecified: Secondary | ICD-10-CM | POA: Diagnosis not present

## 2023-03-25 DIAGNOSIS — I5021 Acute systolic (congestive) heart failure: Secondary | ICD-10-CM | POA: Diagnosis not present

## 2023-03-26 DIAGNOSIS — I2510 Atherosclerotic heart disease of native coronary artery without angina pectoris: Secondary | ICD-10-CM | POA: Diagnosis not present

## 2023-03-26 DIAGNOSIS — I25118 Atherosclerotic heart disease of native coronary artery with other forms of angina pectoris: Secondary | ICD-10-CM | POA: Diagnosis not present

## 2023-03-26 DIAGNOSIS — Z955 Presence of coronary angioplasty implant and graft: Secondary | ICD-10-CM | POA: Insufficient documentation

## 2023-03-26 DIAGNOSIS — N390 Urinary tract infection, site not specified: Secondary | ICD-10-CM | POA: Diagnosis not present

## 2023-03-26 DIAGNOSIS — K8689 Other specified diseases of pancreas: Secondary | ICD-10-CM | POA: Diagnosis not present

## 2023-03-26 DIAGNOSIS — I214 Non-ST elevation (NSTEMI) myocardial infarction: Secondary | ICD-10-CM | POA: Diagnosis not present

## 2023-03-26 DIAGNOSIS — R410 Disorientation, unspecified: Secondary | ICD-10-CM | POA: Diagnosis not present

## 2023-03-26 DIAGNOSIS — N401 Enlarged prostate with lower urinary tract symptoms: Secondary | ICD-10-CM | POA: Diagnosis not present

## 2023-03-26 DIAGNOSIS — I5021 Acute systolic (congestive) heart failure: Secondary | ICD-10-CM | POA: Diagnosis not present

## 2023-03-26 DIAGNOSIS — E875 Hyperkalemia: Secondary | ICD-10-CM | POA: Diagnosis not present

## 2023-03-26 DIAGNOSIS — I130 Hypertensive heart and chronic kidney disease with heart failure and stage 1 through stage 4 chronic kidney disease, or unspecified chronic kidney disease: Secondary | ICD-10-CM | POA: Diagnosis not present

## 2023-03-26 DIAGNOSIS — I5033 Acute on chronic diastolic (congestive) heart failure: Secondary | ICD-10-CM | POA: Diagnosis not present

## 2023-03-26 DIAGNOSIS — E782 Mixed hyperlipidemia: Secondary | ICD-10-CM | POA: Diagnosis not present

## 2023-03-26 DIAGNOSIS — E1151 Type 2 diabetes mellitus with diabetic peripheral angiopathy without gangrene: Secondary | ICD-10-CM | POA: Diagnosis not present

## 2023-03-26 DIAGNOSIS — I77819 Aortic ectasia, unspecified site: Secondary | ICD-10-CM | POA: Diagnosis not present

## 2023-03-26 DIAGNOSIS — Z794 Long term (current) use of insulin: Secondary | ICD-10-CM | POA: Diagnosis not present

## 2023-03-26 DIAGNOSIS — D631 Anemia in chronic kidney disease: Secondary | ICD-10-CM | POA: Diagnosis not present

## 2023-03-26 DIAGNOSIS — E039 Hypothyroidism, unspecified: Secondary | ICD-10-CM | POA: Diagnosis not present

## 2023-03-26 DIAGNOSIS — E1122 Type 2 diabetes mellitus with diabetic chronic kidney disease: Secondary | ICD-10-CM | POA: Diagnosis not present

## 2023-03-26 DIAGNOSIS — Z66 Do not resuscitate: Secondary | ICD-10-CM | POA: Diagnosis not present

## 2023-03-26 DIAGNOSIS — E872 Acidosis, unspecified: Secondary | ICD-10-CM | POA: Diagnosis not present

## 2023-03-26 DIAGNOSIS — E871 Hypo-osmolality and hyponatremia: Secondary | ICD-10-CM | POA: Diagnosis not present

## 2023-03-26 DIAGNOSIS — N184 Chronic kidney disease, stage 4 (severe): Secondary | ICD-10-CM | POA: Diagnosis not present

## 2023-03-26 DIAGNOSIS — Z7982 Long term (current) use of aspirin: Secondary | ICD-10-CM | POA: Diagnosis not present

## 2023-03-26 DIAGNOSIS — J9601 Acute respiratory failure with hypoxia: Secondary | ICD-10-CM | POA: Diagnosis not present

## 2023-03-27 ENCOUNTER — Encounter: Payer: Self-pay | Admitting: *Deleted

## 2023-03-27 ENCOUNTER — Telehealth: Payer: Self-pay | Admitting: *Deleted

## 2023-03-27 NOTE — Transitions of Care (Post Inpatient/ED Visit) (Signed)
03/27/2023  Name: Ethan Mcneil MRN: [id number] DOB: Feb 10, 1934  Today's TOC FU Call Status: Today's TOC FU Call Status:: Successful TOC FU Call Competed TOC FU Call Complete Date: 03/27/23  Transition Care Management Follow-up Telephone Call Date of Discharge: 03/26/23 Discharge Facility: Other (Non[facility] Facility) Name of Other ([facility]) Discharge Facility: [facility] Type of Discharge: Inpatient Admission Primary Inpatient Discharge Diagnosis:: volume overload/ CHF exacerbation/ SOB/ orthopnea How have you been since you were released from the hospital?: Better ("I am feeling better-- not having any problems at all.  Using my walker to get around the house.  My son Maurine Minister is checking in on me several times during the day and staying overnight with me.  He handles my medicine and my medical affairs, call him") Any questions or concerns?: No  Items Reviewed: Did you receive and understand the discharge instructions provided?: Yes (thoroughly reviewed with patient and subsequently his son who verbalizes good understanding of same) Medications obtained and verified?: Yes (Medications Reviewed) (Partial medication review completed; son not near meds at time of TOC call; confirmed patient obtained/ is taking all newly Rx'd medications as instructed; son-manages medications and denies questions/ concerns around medications today) Any new allergies since your discharge?: No Dietary orders reviewed?: Yes Type of Diet Ordered:: Heart healthy/ low salt Do you have support at home?: Yes People in Home: alone Name of Support/Comfort Primary Source: resides alone at baseline; reports independent at baseline; son Maurine Minister currently staying with patient overnight/ checks in throughout day, post-recent hospitalization  Home Care and Equipment/Supplies: Were Home Health Services Ordered?: Yes Name of Home Health Agency:: [facility]- son confirms he has heard from agency and has contact information for  agency; awaiting to schedule until after patient attends HFU PCP office visit on 03/28/23 Has Agency set up a time to come to your home?: No (as above- pending; son confirms he has phone number for agency and has heard from agency to establish services) Any new equipment or medical supplies ordered?: No  Functional Questionnaire: Do you need assistance with bathing/showering or dressing?: Yes (son assists as needed/ indicated) Do you need assistance with meal preparation?: Yes (son assists as needed/ indicated) Do you need assistance with eating?: No Do you have difficulty maintaining continence: No Do you need assistance with getting out of bed/getting out of a chair/moving?: No (reports uses walker regularly) Do you have difficulty managing or taking your medications?: Yes (son manages all aspects of medication administration: encouraged son to take updated medication list to PCP office visit for HFU on 03/28/23 so med list can be updated, since son unable to complete full review at time of TOC call- he verbalizes agreement)  Follow up appointments reviewed: PCP Follow-up appointment confirmed?: Yes Date of PCP follow-up appointment?: 03/28/23 Follow-up Provider: PCP- covering provider Specialist Hospital Follow-up appointment confirmed?: NA Do you need transportation to your follow-up appointment?: No Do you understand care options if your condition(s) worsen?: Yes-patient verbalized understanding  SDOH Interventions Today    Flowsheet Row Most Recent Value  SDOH Interventions   Food Insecurity Interventions Intervention Not Indicated  Transportation Interventions Intervention Not Indicated  [son/ family provides transportation]      TOC Interventions Today    Flowsheet Row Most Recent Value  TOC Interventions   TOC Interventions Discussed/Reviewed TOC Interventions Discussed  [provided my direct contact information should questions/ concerns/ needs arise post-TOC call, prior to RN  CM telephone visit]      Interventions Today    [employer]  Most Recent Value  Chronic Disease   Chronic disease during today's visit Congestive Heart Failure (CHF)  General Interventions   General Interventions Discussed/Reviewed General Interventions Discussed, Doctor Visits, Referral to Nurse, Durable Medical Equipment (DME), Communication with  Doctor Visits Discussed/Reviewed Doctor Visits Discussed, PCP  Durable Medical Equipment (DME) Dan Humphreys  PCP/Specialist Visits Compliance with follow-up visit  Communication with RN, PCP/Specialists  [scheduled post-TOC follow telephone visit with RN CM on 04/03/23,  sent message to PCP team re: hospital discharge recommendation for palliative care referral]  Education Interventions   Education Provided Provided Education  Provided Verbal Education On Medication  [importance of having updated medication list post-recent hospital discharge on 03/26/23]  Nutrition Interventions   Nutrition Discussed/Reviewed Nutrition Discussed  Pharmacy Interventions   Pharmacy Dicussed/Reviewed Pharmacy Topics Discussed  Safety Interventions   Safety Discussed/Reviewed Safety Discussed  [encouraged consistent use of assistive devices (walker)]      Caryl Pina, RN, BSN, CCRN Alumnus RN CM Care Coordination/ Transition of Care- [facility] Care Management [phone]: direct office

## 2023-03-28 ENCOUNTER — Encounter: Payer: Self-pay | Admitting: Family Medicine

## 2023-03-28 ENCOUNTER — Ambulatory Visit (INDEPENDENT_AMBULATORY_CARE_PROVIDER_SITE_OTHER): Payer: Medicare HMO | Admitting: Family Medicine

## 2023-03-28 ENCOUNTER — Other Ambulatory Visit: Payer: Self-pay | Admitting: Family Medicine

## 2023-03-28 VITALS — BP 132/62 | HR 58 | Temp 97.8°F | Ht 70.0 in | Wt 135.5 lb

## 2023-03-28 DIAGNOSIS — I509 Heart failure, unspecified: Secondary | ICD-10-CM | POA: Diagnosis not present

## 2023-03-28 DIAGNOSIS — N3090 Cystitis, unspecified without hematuria: Secondary | ICD-10-CM | POA: Diagnosis not present

## 2023-03-28 DIAGNOSIS — J9601 Acute respiratory failure with hypoxia: Secondary | ICD-10-CM | POA: Insufficient documentation

## 2023-03-28 LAB — BASIC METABOLIC PANEL
BUN: 74 mg/dL — ABNORMAL HIGH (ref 6–23)
CO2: 27 mEq/L (ref 19–32)
Calcium: 8.7 mg/dL (ref 8.4–10.5)
Chloride: 93 mEq/L — ABNORMAL LOW (ref 96–112)
Creatinine, Ser: 3.46 mg/dL — ABNORMAL HIGH (ref 0.40–1.50)
GFR: 15.35 mL/min — ABNORMAL LOW (ref >60.00)
Glucose, Bld: 256 mg/dL — ABNORMAL HIGH (ref 70–99)
Potassium: 4 mEq/L (ref 3.5–5.1)
Sodium: 133 mEq/L — ABNORMAL LOW (ref 135–145)

## 2023-03-28 LAB — CBC
HCT: 28.9 % — ABNORMAL LOW (ref 39.0–52.0)
Hemoglobin: 9.4 g/dL — ABNORMAL LOW (ref 13.0–17.0)
MCHC: 32.7 g/dL (ref 30.0–36.0)
MCV: 95.1 fl (ref 78.0–100.0)
Platelets: 284 10*3/uL (ref 150.0–400.0)
RBC: 3.03 Mil/uL — ABNORMAL LOW (ref 4.22–5.81)
RDW: 16.8 % — ABNORMAL HIGH (ref 11.5–15.5)
WBC: 9.8 10*3/uL (ref 4.0–10.5)

## 2023-03-28 MED ORDER — NITROGLYCERIN 0.2 MG/HR TD PT24: 0.2000 mg | MEDICATED_PATCH | Freq: Every day | TRANSDERMAL | 12 refills | Status: DC

## 2023-03-28 MED ORDER — ATORVASTATIN CALCIUM 80 MG PO TABS: 80.0000 mg | ORAL_TABLET | Freq: Every day | ORAL | 3 refills | Status: DC

## 2023-03-28 MED ORDER — OMEPRAZOLE 40 MG PO CPDR: 40.0000 mg | DELAYED_RELEASE_CAPSULE | Freq: Every day | ORAL | 2 refills | Status: DC

## 2023-03-28 MED ORDER — CEFDINIR 300 MG PO CAPS: 300.0000 mg | ORAL_CAPSULE | Freq: Two times a day (BID) | ORAL | 0 refills | Status: DC

## 2023-03-28 MED ORDER — PANTOPRAZOLE SODIUM 40 MG PO TBEC
40.0000 mg | DELAYED_RELEASE_TABLET | Freq: Every day | ORAL | 1 refills | Status: DC
Start: 2023-03-28 — End: 2023-08-20

## 2023-03-28 MED ORDER — CLOPIDOGREL BISULFATE 75 MG PO TABS: 75.0000 mg | ORAL_TABLET | Freq: Every day | ORAL | 1 refills | Status: DC

## 2023-03-28 MED ORDER — ISOSORBIDE MONONITRATE ER 120 MG PO TB24: 120.0000 mg | ORAL_TABLET | Freq: Every day | ORAL | 2 refills | Status: DC

## 2023-03-28 NOTE — Patient Instructions (Signed)
Give [place] 2-3 business days to get the results of your labs back.   Stay active.  Please call your heart doctor: [phone]; this is to follow up for your recent hospitalization.   Let us know if you need anything.

## 2023-03-28 NOTE — Progress Notes (Signed)
Chief Complaint  Patient presents with   Hospitalization Follow-up    Subjective: Patient is a 89 y.o. male here for f/u.  He is here with the son who helps with the history.  The patient was admitted to [facility] on 03/17/2023 and discharged on 03/26/23 for unstable angina.  There is no discharge summary completed at the moment.  He was diuresed, evaluated for ischemia, and had some medication changes.  He was also found to have cystitis and was started on antibiotics on 03/25/2023.  He is currently taking Omnicef 300 mg daily.  He reports he is breathing much better and has not had chest pain since then.  Appetite is returning albeit very slowly.  He has not had any fevers, abdominal pain, urinary complaints, chest pain, shortness of breath.  They decreased his Basaglar in the hospital.  Since being discharged, his sugars have been spiking in the 200s.  He is well-managed by the endocrinology team but his last A1c was 6.2.  Past Medical History:  Diagnosis Date   Anemia    Arthritis    CAD (coronary artery disease)    CABG 1997; grafts patent @ cath [facility] 11/09   DM2 (diabetes mellitus, type 2)    Gout    after tick bite   History of kidney stones    HLD (hyperlipidemia)    HTN (hypertension)    essential nos   Hypothyroid    Pancreatic lesion    Postsurgical aortocoronary bypass status    PVD (peripheral vascular disease)    S/P herniorrhaphy    Tick fever 2012   [facility] [place]   Unspecified hyperplasia of prostate without urinary obstruction and other lower urinary tract symptoms (LUTS)    Urolithiasis 2015   hx of   Wears dentures    Wears glasses     Objective: BP 132/62 (BP Location: Right Arm, Patient Position: Sitting, Cuff Size: Normal)   Pulse (!) 58   Temp 97.8 F (36.6 C) (Oral)   Ht 5\' 10"  (1.778 m)   Wt 135 lb 8 oz (61.5 kg)   SpO2 93%   BMI 19.44 kg/m  General: Awake, appears stated age Heart: Regular rhythm, bradycardic, no LE  edema Lungs: CTAB, no rales, wheezes or rhonchi. No accessory muscle use Psych: normal affect and mood  Assessment and Plan: Acute on chronic congestive heart failure, unspecified heart failure type - Plan: CBC, Basic metabolic panel  Cystitis  Follow-up on labs from the hospital.  He has not seen his cardiologist in quite some time.  I provided the contact information in his paperwork and recommended he see them. Complete the course of Omnicef, symptoms are improving. He will return to his regular insulin regimen as dictated by the endocrinology team. The patient and his son voiced understanding and agreement to the plan.  I spent 32 minutes with the patient's and his son discussing the above plans in addition to reviewing his chart/recent hospitalization on the same day of the visit.  Ethan Roche [place], DO 03/28/23  12:56 PM

## 2023-03-31 ENCOUNTER — Telehealth: Payer: Self-pay | Admitting: Internal Medicine

## 2023-03-31 ENCOUNTER — Other Ambulatory Visit: Payer: Self-pay | Admitting: Internal Medicine

## 2023-03-31 DIAGNOSIS — N400 Enlarged prostate without lower urinary tract symptoms: Secondary | ICD-10-CM | POA: Diagnosis not present

## 2023-03-31 DIAGNOSIS — I5033 Acute on chronic diastolic (congestive) heart failure: Secondary | ICD-10-CM | POA: Diagnosis not present

## 2023-03-31 DIAGNOSIS — I2510 Atherosclerotic heart disease of native coronary artery without angina pectoris: Secondary | ICD-10-CM | POA: Diagnosis not present

## 2023-03-31 DIAGNOSIS — E1165 Type 2 diabetes mellitus with hyperglycemia: Secondary | ICD-10-CM | POA: Diagnosis not present

## 2023-03-31 DIAGNOSIS — J9601 Acute respiratory failure with hypoxia: Secondary | ICD-10-CM | POA: Diagnosis not present

## 2023-03-31 DIAGNOSIS — N390 Urinary tract infection, site not specified: Secondary | ICD-10-CM | POA: Diagnosis not present

## 2023-03-31 DIAGNOSIS — E782 Mixed hyperlipidemia: Secondary | ICD-10-CM | POA: Diagnosis not present

## 2023-03-31 DIAGNOSIS — Z955 Presence of coronary angioplasty implant and graft: Secondary | ICD-10-CM | POA: Diagnosis not present

## 2023-03-31 DIAGNOSIS — E1122 Type 2 diabetes mellitus with diabetic chronic kidney disease: Secondary | ICD-10-CM | POA: Diagnosis not present

## 2023-03-31 DIAGNOSIS — D519 Vitamin B12 deficiency anemia, unspecified: Secondary | ICD-10-CM | POA: Diagnosis not present

## 2023-03-31 DIAGNOSIS — E039 Hypothyroidism, unspecified: Secondary | ICD-10-CM | POA: Diagnosis not present

## 2023-03-31 DIAGNOSIS — K8681 Exocrine pancreatic insufficiency: Secondary | ICD-10-CM | POA: Diagnosis not present

## 2023-03-31 DIAGNOSIS — N184 Chronic kidney disease, stage 4 (severe): Secondary | ICD-10-CM | POA: Diagnosis not present

## 2023-03-31 DIAGNOSIS — E1151 Type 2 diabetes mellitus with diabetic peripheral angiopathy without gangrene: Secondary | ICD-10-CM | POA: Diagnosis not present

## 2023-03-31 DIAGNOSIS — I130 Hypertensive heart and chronic kidney disease with heart failure and stage 1 through stage 4 chronic kidney disease, or unspecified chronic kidney disease: Secondary | ICD-10-CM | POA: Diagnosis not present

## 2023-03-31 NOTE — Telephone Encounter (Signed)
Called the pharmacy informed of Provider response to question Pharmacy will let the patient/and son know.

## 2023-03-31 NOTE — Telephone Encounter (Signed)
Pantoprazole is on his list, not omeprazole

## 2023-03-31 NOTE — Telephone Encounter (Signed)
We sent pantoprazole because it is in the same class but doesn't interact with one of the his meds (Plavix) like omeprazole does.

## 2023-03-31 NOTE — Telephone Encounter (Signed)
Called at 1:38 pharmacy was closed for lunch Will call back

## 2023-03-31 NOTE — Telephone Encounter (Signed)
Ethan Mcneil with [facility] called to advise that per son, Omeprazole should have been called in instead of pantoprazole. Please send updated script to them.

## 2023-04-03 ENCOUNTER — Ambulatory Visit: Payer: Self-pay

## 2023-04-03 NOTE — Patient Outreach (Signed)
  Care Coordination   Initial Visit Note   04/03/2023 Name: DWYANE DUPREE MRN: [id number] DOB: [birth year]  DREUX MCGROARTY is a 89 y.o. year old male who sees Drue Novel, Nolon Rod, MD for primary care. I spoke with  [name] by phone today.  What matters to the patients health and wellness today?  Admit4/1/24-4/10/24 with acute on chronic resporatory failure, HF exacerbation. Patient reports he is doing better. Reports edema to legs has gone down. He states he has scales, but does not weigh like he should. He denies any concerns or issues at this time. Patient states he lives alone, but has 4 supportive sons. He denies any questions regarding medication management. Mr. Greig Castilla reports he has an appointment with neurolgoist on 04/06/22, but has not scheduled an appointment with cardiologist.   Goals Addressed             This Visit's Progress    Continue to improve post hospitalization       Interventions Today    Flowsheet Row Most Recent Value  Chronic Disease   Chronic disease during today's visit Congestive Heart Failure (CHF)  General Interventions   General Interventions Discussed/Reviewed General Interventions Discussed, Doctor Visits  [discussed care coordination program. provided patient with RNCM contact number and encouraged to contact with care coordination needs as needed.]  Doctor Visits Discussed/Reviewed Doctor Visits Discussed, PCP  [reviewed patient instructions from PCP office visit 03/28/23.]  Exercise Interventions   Exercise Discussed/Reviewed Physical Activity  [reiterated instructions per PCP to patient to remain active]  Education Interventions   Education Provided Provided Education  [disucssed signs/symptoms of HF exacerbation and when to call the doctor. discussed the importance of scheduling appointment with cardiologist for hospital follow up appointment-patient states he will schedule. contact number provided..]  Provided Verbal Education On Medication, When to  see the doctor, Exercise, Blood Sugar Monitoring  [discussed HF exacerbation signs/symptoms. discussed target blood sugar range 80-130 fasting or <180 about 2 ours after meals or per provider recommendations.]  Pharmacy Interventions   Pharmacy Dicussed/Reviewed Pharmacy Topics Discussed, Medications and their functions, Medication Adherence, Affording Medications            SDOH assessments and interventions completed:  Yes  SDOH Interventions Today    Flowsheet Row Most Recent Value  SDOH Interventions   Housing Interventions Intervention Not Indicated  Utilities Interventions Intervention Not Indicated     Care Coordination Interventions:  Yes, provided   Follow up plan: Follow up call scheduled for 04/18/23    Encounter Outcome:  Pt. Visit Completed   Kathyrn Sheriff, RN, MSN, BSN, CCM [facility] Care Coordinator [phone]

## 2023-04-03 NOTE — Patient Instructions (Signed)
Visit Information  Thank you for taking time to visit with me today. Please don't hesitate to contact me if I can be of assistance to you.   Following are the goals we discussed today:   Goals Addressed             This Visit's Progress    Continue to improve post hospitalization       Interventions Today    Flowsheet Row Most Recent Value  Chronic Disease   Chronic disease during today's visit Congestive Heart Failure (CHF)  General Interventions   General Interventions Discussed/Reviewed General Interventions Discussed, Doctor Visits  [discussed care coordination program. provided patient with RNCM contact number and encouraged to contact with care coordination needs as needed.]  Doctor Visits Discussed/Reviewed Doctor Visits Discussed, PCP  [reviewed patient instructions from PCP office visit 03/28/23.]  Exercise Interventions   Exercise Discussed/Reviewed Physical Activity  [reiterated instructions per PCP to patient to remain active]  Education Interventions   Education Provided Provided Education  [disucssed signs/symptoms of HF exacerbation and when to call the doctor. discussed the importance of scheduling appointment with cardiologist for hospital follow up appointment-patient states he will schedule. contact number provided..]  Provided Verbal Education On Medication, When to see the doctor, Exercise, Blood Sugar Monitoring  [discussed HF exacerbation signs/symptoms. discussed target blood sugar range 80-130 fasting or <180 about 2 ours after meals or per provider recommendations.]  Pharmacy Interventions   Pharmacy Dicussed/Reviewed Pharmacy Topics Discussed, Medications and their functions, Medication Adherence, Affording Medications            Our next appointment is by telephone on 04/18/23 at 10 am  Please call the care guide team at [phone] if you need to cancel or reschedule your appointment.   If you are experiencing a Mental Health or Behavioral Health  Crisis or need someone to talk to, please call the Suicide and Crisis Lifeline: 20  Kathyrn Sheriff, RN, MSN, BSN, CCM [facility] Care Coordinator [phone]

## 2023-04-08 ENCOUNTER — Telehealth: Payer: Self-pay

## 2023-04-08 NOTE — Telephone Encounter (Signed)
Resumption of care orders (order number: [id number]) received from [facility]- orders signed and faxed back to [phone]. Form sent for scanning.

## 2023-04-09 ENCOUNTER — Encounter: Payer: Self-pay | Admitting: Physician Assistant

## 2023-04-09 ENCOUNTER — Ambulatory Visit: Payer: Medicare HMO | Attending: Physician Assistant | Admitting: Physician Assistant

## 2023-04-09 VITALS — BP 130/66 | HR 58 | Ht 70.0 in | Wt 147.4 lb

## 2023-04-09 DIAGNOSIS — I5023 Acute on chronic systolic (congestive) heart failure: Secondary | ICD-10-CM | POA: Diagnosis not present

## 2023-04-09 DIAGNOSIS — I25810 Atherosclerosis of coronary artery bypass graft(s) without angina pectoris: Secondary | ICD-10-CM

## 2023-04-09 DIAGNOSIS — E785 Hyperlipidemia, unspecified: Secondary | ICD-10-CM

## 2023-04-09 DIAGNOSIS — N184 Chronic kidney disease, stage 4 (severe): Secondary | ICD-10-CM | POA: Diagnosis not present

## 2023-04-09 DIAGNOSIS — I10 Essential (primary) hypertension: Secondary | ICD-10-CM | POA: Diagnosis not present

## 2023-04-09 MED ORDER — NITROGLYCERIN 0.4 MG SL SUBL: 0.4000 mg | SUBLINGUAL_TABLET | SUBLINGUAL | 2 refills | Status: DC | PRN

## 2023-04-09 NOTE — Patient Instructions (Signed)
Medication Instructions:   Your physician recommends that you continue on your current medications as directed. Please refer to the Current Medication list given to you today.  *If you need a refill on your cardiac medications before your next appointment, please call your pharmacy*  Lab Work: NONE ordered at this time of appointment   If you have labs (blood work) drawn today and your tests are completely normal, you will receive your results only by: MyChart Message (if you have MyChart) OR A paper copy in the mail If you have any lab test that is abnormal or we need to change your treatment, we will call you to review the results.  Testing/Procedures: NONE ordered at this time of appointment   Follow-Up: At [facility], you and your health needs are our priority.  As part of our continuing mission to provide you with exceptional heart care, we have created designated Provider Care Teams.  These Care Teams include your primary Cardiologist (physician) and Advanced Practice Providers (APPs -  Physician Assistants and Nurse Practitioners) who all work together to provide you with the care you need, when you need it.    Your next appointment:   6 week(s)  Provider:   APP    Then, Rollene Rotunda, MD will plan to see you again in 3-4 month(s).    Other Instructions

## 2023-04-09 NOTE — Progress Notes (Signed)
Cardiology Office Note:    Date:  04/11/2023   ID:  [name], DOB July 22, 1934, MRN [id number]  PCP:  Wanda Plump, MD   [facility] Providers Cardiologist:  Rollene Rotunda, MD     Referring MD: Wanda Plump, MD   Chief Complaint  Patient presents with   Follow-up    Seen for Dr. Antoine Poche    History of Present Illness:    Ethan Mcneil is a 89 y.o. 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  Last cardiac catheterization in 2009 revealed patent grafts.   Patient was seen previously in the emergency room for shortness of breath in December 2022.  BNP at the time was 1900.  Creatinine 2.5.  He received 40 mg IV Lasix which made him feel better.  He was seen by Dr. Anne Fu on 12/28/2021, he was felt to be massively volume overloaded at the time.  Patient was directly admitted to the hospital.  Echocardiogram obtained on 12/29/2021 showed EF 55%, mild LVH, grade 2 DD, RVSP 44.2 mmHg, mild to moderate MR, moderate to severe TR. he was admitted for acute on chronic heart failure and community-acquired pneumonia in April 2023.  Metoprolol was decreased due to slow heart rate.  He was discharged home on torsemide.    He was seen by Dr. Antoine Poche in June 2023 at which time he was felt to be volume overloaded, he was encouraged to take additional doses of Demadex for 3 days before going back to the previous dose.  More recently, patient went to [facility] ED on 03/16/2023 with chest pain and progressive dyspnea and orthopnea.  He was found to be volume overloaded was edema requiring BiPAP.  He was subsequently transferred to [facility] and was treated for acute hypoxic respiratory failure and acute on chronic diastolic heart failure.  BNP was elevated at 2231.  At bedtime troponin 163 --> 906 before, down.  Echocardiogram obtained on 03/17/2023 demonstrated EF 35%.  With IV diuresis, creatinine trended up to  3.87 before coming down to 3.22 on the day of discharge.  The chest pain and elevated troponin was managed medically given advanced CKD stage IV which make him not a good candidate for any invasive study that requiring contrast.  He has anemia with baseline hemoglobin of 9-10 during hospitalization.  Hospital course complicated by UTI treated with ceftriaxone and discharged on renally dosed cefdinir for 6 days.  Patient presents today accompanied by his son.  We have discussed the recent hospitalization.  His lung is clear even though his weight is slightly up.  Most recent blood work was 3.4.  He has a follow-up with nephrology service next Monday.  His weight is 147 pounds today compared to 135 pounds on 03/28/2023, however according to the patient, based his on his home scale, he is weight has not significantly changed.  He is aware to take extra 20 mg of torsemide in the afternoon if his weight goes up by more than 3 pounds overnight or 5 pounds in a single week.  We also discussed fluid restriction and salt restriction.  He has not had any anginal symptoms since his discharge.  He can follow-up with APP in 6 weeks and Dr. Antoine Poche in 3 to 4 months.   Past Medical History:  Diagnosis Date   Anemia    Arthritis    CAD (coronary artery disease)  CABG 1997; grafts patent @ cath [facility] 11/09   DM2 (diabetes mellitus, type 2) (HCC)    Gout    after tick bite   History of kidney stones    HLD (hyperlipidemia)    HTN (hypertension)    essential nos   Hypothyroid    Pancreatic lesion    Postsurgical aortocoronary bypass status    PVD (peripheral vascular disease) (HCC)    S/P herniorrhaphy    Tick fever 2012   [facility]   Unspecified hyperplasia of prostate without urinary obstruction and other lower urinary tract symptoms (LUTS)    Urolithiasis 2015   hx of   Wears dentures    Wears glasses     Past Surgical History:  Procedure Laterality Date   BIOPSY  08/07/2020    Procedure: BIOPSY;  Surgeon: Lemar Lofty., MD;  Location: [facility] ENDOSCOPY;  Service: Gastroenterology;;   CATARACT EXTRACTION Bilateral    CORONARY ARTERY BYPASS GRAFT     x7 vessels 1997; cath 2002 and 2009; grafts patent    CYSTOSCOPY WITH RETROGRADE PYELOGRAM, URETEROSCOPY AND STENT PLACEMENT Right 02/21/2014   Procedure: CYSTOSCOPY WITH RIGHT  RETROGRADE PYELOGRAM,RIGHT  URETEROSCOPY WITH STONE BASKETING EXTRACTION;  Surgeon: Bjorn Pippin, MD;  Location: [facility] ORS;  Service: Urology;  Laterality: Right;   ESOPHAGOGASTRODUODENOSCOPY (EGD) WITH PROPOFOL N/A 08/07/2020   Procedure: ESOPHAGOGASTRODUODENOSCOPY (EGD) WITH PROPOFOL;  Surgeon: Meridee Score Netty Starring., MD;  Location: [facility] ENDOSCOPY;  Service: Gastroenterology;  Laterality: N/A;   EUS N/A 08/07/2020   Procedure: UPPER ENDOSCOPIC ULTRASOUND (EUS) LINEAR;  Surgeon: Lemar Lofty., MD;  Location: [facility] ENDOSCOPY;  Service: Gastroenterology;  Laterality: N/A;   FINE NEEDLE ASPIRATION N/A 08/07/2020   Procedure: FINE NEEDLE ASPIRATION (FNA) LINEAR;  Surgeon: Lemar Lofty., MD;  Location: [facility] ENDOSCOPY;  Service: Gastroenterology;  Laterality: N/A;   HERNIA REPAIR     INGUINAL HERNIA REPAIR     MULTIPLE TOOTH EXTRACTIONS     SHOULDER SURGERY     SPLENECTOMY, TOTAL N/A 10/26/2020   Procedure: SPLENECTOMY;  Surgeon: Almond Lint, MD;  Location: [facility] OR;  Service: General;  Laterality: N/A;    Current Medications: Current Meds  Medication Sig   acetaminophen (TYLENOL) 500 MG tablet Take 500 mg by mouth every 4 (four) hours as needed for moderate pain.   amLODipine (NORVASC) 10 MG tablet Take 1 tablet (10 mg total) by mouth daily.   aspirin EC 81 MG tablet Take 81 mg by mouth at bedtime.   atorvastatin (LIPITOR) 80 MG tablet Take 1 tablet (80 mg total) by mouth daily.   B-D UF III MINI PEN NEEDLES 31G X 5 MM MISC USE 3 TIMES A DAY AS DIRECTED   Cholecalciferol (VITAMIN D3) 50 MCG (2000 UT) capsule Take 2,000 Units by  mouth daily.   clopidogrel (PLAVIX) 75 MG tablet Take 1 tablet (75 mg total) by mouth daily.   Continuous Blood Gluc Receiver (FREESTYLE LIBRE 2 READER) DEVI Use as instructed to check blood sugar   Continuous Blood Gluc Sensor (FREESTYLE LIBRE 2 SENSOR) MISC 1 each by Does not apply route every 14 (fourteen) days.   Cyanocobalamin (B-12) 1000 MCG TABS Take 1,000 mcg by mouth daily.   FREESTYLE LITE test strip CHECK BLOOD SUGAR NO MORE THAN TWICE DAILY.   hydrALAZINE (APRESOLINE) 100 MG tablet Take 100 mg by mouth 3 (three) times daily.   insulin aspart (FIASP) 100 UNIT/ML FlexTouch Pen Inject 6-8 Units into the skin with breakfast, with lunch, and with  evening meal.   Insulin Glargine (BASAGLAR KWIKPEN) 100 UNIT/ML Inject 10-12 Units into the skin daily.   isosorbide mononitrate (IMDUR) 120 MG 24 hr tablet Take 1 tablet (120 mg total) by mouth daily.   levothyroxine (SYNTHROID) 150 MCG tablet Take 1 tablet (150 mcg total) by mouth daily before breakfast.   metoprolol succinate (TOPROL-XL) 25 MG 24 hr tablet Take 1 tablet (25 mg total) by mouth daily.   Pancrelipase, Lip-Prot-Amyl, (ZENPEP) 25000-79000 units CPEP Take 2 capsules by mouth with every meal and snacks (Patient taking differently: Take 1 capsule by mouth daily at 12 noon.)   pantoprazole (PROTONIX) 40 MG tablet Take 1 tablet (40 mg total) by mouth daily.   sodium bicarbonate 650 MG tablet Take 650 mg by mouth 2 (two) times daily.   torsemide (DEMADEX) 20 MG tablet Take 2 tablets by mouth daily.   [DISCONTINUED] nitroGLYCERIN (NITRODUR - DOSED IN MG/24 HR) 0.2 mg/hr patch Place 1 patch (0.2 mg total) onto the skin daily.   [DISCONTINUED] nitroGLYCERIN (NITROSTAT) 0.4 MG SL tablet Place 1 tablet (0.4 mg total) under the tongue every 5 (five) minutes x 3 doses as needed for chest pain.     Allergies:   Ciprofloxacin and Codeine   Social History   Socioeconomic History   Marital status: Married    Spouse name: Pat   Number of  children: 4   Years of education: Not on file   Highest education level: Not on file  Occupational History   Occupation: retired, works few hours   Tobacco Use   Smoking status: Never   Smokeless tobacco: Never  Vaping Use   Vaping Use: Never used  Substance and Sexual Activity   Alcohol use: No   Drug use: No   Sexual activity: Not Currently  Other Topics Concern   Not on file  Social History Narrative   Lives alone   Drives      Patient Curator; aviator and former race car driver   4 children (boys)      Social Determinants of Health   Financial Resource Strain: Low Risk  (07/16/2022)   Overall Financial Resource Strain (CARDIA)    Difficulty of Paying Living Expenses: Not hard at all  Food Insecurity: No Food Insecurity (03/27/2023)   Hunger Vital Sign    Worried About Running Out of Food in the Last Year: Never true    Ran Out of Food in the Last Year: Never true  Transportation Needs: No Transportation Needs (03/27/2023)   PRAPARE - Administrator, Civil Service (Medical): No    Lack of Transportation (Non-Medical): No  Physical Activity: Insufficiently Active (07/16/2022)   Exercise Vital Sign    Days of Exercise per Week: 5 days    Minutes of Exercise per Session: 20 min  Stress: No Stress Concern Present (07/16/2022)   [employer] of Occupational Health - Occupational Stress Questionnaire    Feeling of Stress : Not at all  Social Connections: Moderately Isolated (07/16/2022)   Social Connection and Isolation Panel [NHANES]    Frequency of Communication with Friends and Family: More than three times a week    Frequency of Social Gatherings with Friends and Family: More than three times a week    Attends Religious Services: More than 4 times per year    Active Member of [employer] or Organizations: No    Attends Banker Meetings: Never    Marital Status: Widowed     Family History: The  patient's family history includes Coronary artery  disease in his mother; Diabetes in his brother; Lung cancer in his father; Prostate cancer in his maternal uncle; Stroke in his son; Throat cancer in his son. There is no history of Colon cancer, Rectal cancer, Stomach cancer, Esophageal cancer, or Pancreatic cancer.  ROS:   Please see the history of present illness.     All other systems reviewed and are negative.  EKGs/Labs/Other Studies Reviewed:    The following studies were reviewed today:  Echo 12/29/2021 1. Left ventricular ejection fraction, by estimation, is 55%. The left  ventricle has normal function. The left ventricle has no regional wall  motion abnormalities. There is mild concentric left ventricular  hypertrophy. Left ventricular diastolic  parameters are consistent with Grade II diastolic dysfunction  (pseudonormalization).   2. Right ventricular systolic function is normal. The right ventricular  size is normal. There is mildly elevated pulmonary artery systolic  pressure. The estimated right ventricular systolic pressure is 44.2 mmHg.   3. Left atrial size was mildly dilated.   4. The mitral valve is abnormal. Mild to moderate mitral valve  regurgitation. There is pulmonary vein blunting without reversal. Study  may underestimate regurgitation severity (best seen long axis view). No  evidence of mitral stenosis.   5. Tricuspid valve regurgitation is moderate to severe.   6. The aortic valve is tricuspid. Aortic valve regurgitation is not  visualized.   7. Pulmonic valve regurgitation is moderate.   8. Mildly dilated pulmonary artery.   9. The inferior vena cava is normal in size with greater than 50%  respiratory variability, suggesting right atrial pressure of 3 mmHg.   Comparison(s): Compared to outside report, tricuspid and mitral valve  regurgitation are worse.    Echo at [facility] 03/17/2023 Summary   1. The left ventricle is normal in size with mildly increased wall  thickness.   2. The left ventricular  systolic function is moderately decreased, LVEF is  visually estimated at 35%.    3. The inferior and inferolateral walls are hypokinetic.    4. The right ventricle is mildly dilated in size, with normal systolic  function.   5. The aorta at the sinuses of Valsalva is mildly dilated ( 4.0cm).    6. There is mild mitral stenosis.    7. There is mild to moderate mitral valve regurgitation.    8. There is mild pulmonic regurgitation.   EKG:  EKG is ordered today.  The ekg ordered today demonstrates normal sinus rhythm, T wave inversion in the inferolateral leads.  Evidence of LVH.  Recent Labs: 04/22/2022: ALT 36 11/26/2022: TSH 3.22 03/28/2023: BUN 74; Creatinine, Ser 3.46; Hemoglobin 9.4; Platelets 284.0; Potassium 4.0; Sodium 133  Recent Lipid Panel    Component Value Date/Time   CHOL 116 04/22/2022 0907   TRIG 69 04/22/2022 0907   HDL 57 04/22/2022 0907   CHOLHDL 2.0 04/22/2022 0907   CHOLHDL 2 09/25/2021 0901   VLDL 17.4 09/25/2021 0901   LDLCALC 45 04/22/2022 0907     Risk Assessment/Calculations:           Physical Exam:    VS:  BP 130/66   Pulse (!) 58   Ht 5\' 10"  (1.778 m)   Wt 147 lb 6.4 oz (66.9 kg)   BMI 21.15 kg/m        Wt Readings from Last 3 Encounters:  04/09/23 147 lb 6.4 oz (66.9 kg)  03/28/23 135 lb 8 oz (61.5 kg)  03/12/23  152 lb 4.8 oz (69.1 kg)     GEN:  Well nourished, well developed in no acute distress HEENT: Normal NECK: No JVD; No carotid bruits LYMPHATICS: No lymphadenopathy CARDIAC: RRR, no murmurs, rubs, gallops RESPIRATORY:  Clear to auscultation without rales, wheezing or rhonchi  ABDOMEN: Soft, non-tender, non-distended MUSCULOSKELETAL:  No edema; No deformity  SKIN: Warm and dry NEUROLOGIC:  Alert and oriented x 3 PSYCHIATRIC:  Normal affect   ASSESSMENT:    1. Acute on chronic systolic heart failure (HCC)   2. Coronary artery disease involving coronary bypass graft of native heart without angina pectoris   3. Chronic  kidney disease (CKD), stage IV (severe) (HCC)   4. Essential hypertension   5. Hyperlipidemia LDL goal <70    PLAN:    In order of problems listed above:  Acute on chronic systolic heart failure: She was recently diagnosed with systolic heart failure with EF down to 35%.  She underwent IV diuresis at [facility].  Since discharge, his breathing has been stable.  He is on torsemide 40 mg daily with option of taking additional 20 mg in the afternoon as needed if his weight goes up by more than 3 pounds overnight or 5 pounds in a single week  CAD s/p CABG: He was recently admitted with NSTEMI at [facility].  EF down to 35%.  He was managed medically as he is not a good candidate for invasive study due to CKD stage IV.  CKD stage IV: After recent diuresis, creatinine remained in the mid 3 range.  He will need close outpatient follow-up with nephrology service  Hypertension: Blood pressure stable  Hyperlipidemia: On Lipitor           Medication Adjustments/Labs and Tests Ordered: Current medicines are reviewed at length with the patient today.  Concerns regarding medicines are outlined above.  Orders Placed This Encounter  Procedures   EKG 12-Lead   Meds ordered this encounter  Medications   nitroGLYCERIN (NITROSTAT) 0.4 MG SL tablet    Sig: Place 1 tablet (0.4 mg total) under the tongue every 5 (five) minutes x 3 doses as needed for chest pain.    Dispense:  25 tablet    Refill:  2    Patient Instructions  Medication Instructions:   Your physician recommends that you continue on your current medications as directed. Please refer to the Current Medication list given to you today.  *If you need a refill on your cardiac medications before your next appointment, please call your pharmacy*  Lab Work: NONE ordered at this time of appointment   If you have labs (blood work) drawn today and your tests are completely normal, you will receive your results only by: MyChart Message  (if you have MyChart) OR A paper copy in the mail If you have any lab test that is abnormal or we need to change your treatment, we will call you to review the results.  Testing/Procedures: NONE ordered at this time of appointment   Follow-Up: At [facility], you and your health needs are our priority.  As part of our continuing mission to provide you with exceptional heart care, we have created designated Provider Care Teams.  These Care Teams include your primary Cardiologist (physician) and Advanced Practice Providers (APPs -  Physician Assistants and Nurse Practitioners) who all work together to provide you with the care you need, when you need it.    Your next appointment:   6 week(s)  Provider:  APP    Then, Rollene Rotunda, MD will plan to see you again in 3-4 month(s).    Other Instructions     Signed, Azalee Course, [place]  04/11/2023 9:33 PM    [facility]

## 2023-04-11 ENCOUNTER — Encounter: Payer: Self-pay | Admitting: Physician Assistant

## 2023-04-11 ENCOUNTER — Telehealth: Payer: Self-pay

## 2023-04-11 DIAGNOSIS — E1151 Type 2 diabetes mellitus with diabetic peripheral angiopathy without gangrene: Secondary | ICD-10-CM | POA: Diagnosis not present

## 2023-04-11 DIAGNOSIS — I130 Hypertensive heart and chronic kidney disease with heart failure and stage 1 through stage 4 chronic kidney disease, or unspecified chronic kidney disease: Secondary | ICD-10-CM | POA: Diagnosis not present

## 2023-04-11 DIAGNOSIS — E039 Hypothyroidism, unspecified: Secondary | ICD-10-CM | POA: Diagnosis not present

## 2023-04-11 DIAGNOSIS — J9601 Acute respiratory failure with hypoxia: Secondary | ICD-10-CM | POA: Diagnosis not present

## 2023-04-11 DIAGNOSIS — N184 Chronic kidney disease, stage 4 (severe): Secondary | ICD-10-CM | POA: Diagnosis not present

## 2023-04-11 DIAGNOSIS — I5033 Acute on chronic diastolic (congestive) heart failure: Secondary | ICD-10-CM | POA: Diagnosis not present

## 2023-04-11 DIAGNOSIS — I2510 Atherosclerotic heart disease of native coronary artery without angina pectoris: Secondary | ICD-10-CM | POA: Diagnosis not present

## 2023-04-11 DIAGNOSIS — N400 Enlarged prostate without lower urinary tract symptoms: Secondary | ICD-10-CM | POA: Diagnosis not present

## 2023-04-11 DIAGNOSIS — E782 Mixed hyperlipidemia: Secondary | ICD-10-CM

## 2023-04-11 DIAGNOSIS — E1165 Type 2 diabetes mellitus with hyperglycemia: Secondary | ICD-10-CM | POA: Diagnosis not present

## 2023-04-11 DIAGNOSIS — E1122 Type 2 diabetes mellitus with diabetic chronic kidney disease: Secondary | ICD-10-CM | POA: Diagnosis not present

## 2023-04-11 DIAGNOSIS — D519 Vitamin B12 deficiency anemia, unspecified: Secondary | ICD-10-CM | POA: Diagnosis not present

## 2023-04-11 DIAGNOSIS — Z955 Presence of coronary angioplasty implant and graft: Secondary | ICD-10-CM

## 2023-04-11 DIAGNOSIS — N390 Urinary tract infection, site not specified: Secondary | ICD-10-CM | POA: Diagnosis not present

## 2023-04-11 DIAGNOSIS — K8681 Exocrine pancreatic insufficiency: Secondary | ICD-10-CM

## 2023-04-11 NOTE — Telephone Encounter (Signed)
Plan of care signed and faxed back to [facility] at [phone]. Form sent for scanning. Order number: [id number]

## 2023-04-12 DIAGNOSIS — N179 Acute kidney failure, unspecified: Secondary | ICD-10-CM | POA: Diagnosis not present

## 2023-04-12 DIAGNOSIS — R519 Headache, unspecified: Secondary | ICD-10-CM | POA: Diagnosis not present

## 2023-04-12 DIAGNOSIS — J9601 Acute respiratory failure with hypoxia: Secondary | ICD-10-CM | POA: Diagnosis not present

## 2023-04-12 DIAGNOSIS — E785 Hyperlipidemia, unspecified: Secondary | ICD-10-CM | POA: Diagnosis not present

## 2023-04-12 DIAGNOSIS — I501 Left ventricular failure: Secondary | ICD-10-CM | POA: Diagnosis not present

## 2023-04-12 DIAGNOSIS — I272 Pulmonary hypertension, unspecified: Secondary | ICD-10-CM | POA: Diagnosis not present

## 2023-04-12 DIAGNOSIS — K219 Gastro-esophageal reflux disease without esophagitis: Secondary | ICD-10-CM | POA: Diagnosis not present

## 2023-04-12 DIAGNOSIS — Z79899 Other long term (current) drug therapy: Secondary | ICD-10-CM | POA: Diagnosis not present

## 2023-04-12 DIAGNOSIS — Z7902 Long term (current) use of antithrombotics/antiplatelets: Secondary | ICD-10-CM | POA: Diagnosis not present

## 2023-04-12 DIAGNOSIS — I5023 Acute on chronic systolic (congestive) heart failure: Secondary | ICD-10-CM | POA: Diagnosis not present

## 2023-04-12 DIAGNOSIS — N189 Chronic kidney disease, unspecified: Secondary | ICD-10-CM | POA: Diagnosis not present

## 2023-04-12 DIAGNOSIS — E1122 Type 2 diabetes mellitus with diabetic chronic kidney disease: Secondary | ICD-10-CM | POA: Diagnosis not present

## 2023-04-12 DIAGNOSIS — E43 Unspecified severe protein-calorie malnutrition: Secondary | ICD-10-CM | POA: Diagnosis not present

## 2023-04-12 DIAGNOSIS — I5032 Chronic diastolic (congestive) heart failure: Secondary | ICD-10-CM | POA: Diagnosis not present

## 2023-04-12 DIAGNOSIS — R079 Chest pain, unspecified: Secondary | ICD-10-CM | POA: Diagnosis not present

## 2023-04-12 DIAGNOSIS — E039 Hypothyroidism, unspecified: Secondary | ICD-10-CM | POA: Diagnosis not present

## 2023-04-12 DIAGNOSIS — E1151 Type 2 diabetes mellitus with diabetic peripheral angiopathy without gangrene: Secondary | ICD-10-CM | POA: Diagnosis not present

## 2023-04-12 DIAGNOSIS — R778 Other specified abnormalities of plasma proteins: Secondary | ICD-10-CM | POA: Diagnosis not present

## 2023-04-12 DIAGNOSIS — Z833 Family history of diabetes mellitus: Secondary | ICD-10-CM | POA: Diagnosis not present

## 2023-04-12 DIAGNOSIS — J9811 Atelectasis: Secondary | ICD-10-CM | POA: Diagnosis not present

## 2023-04-12 DIAGNOSIS — R0602 Shortness of breath: Secondary | ICD-10-CM | POA: Diagnosis not present

## 2023-04-12 DIAGNOSIS — I200 Unstable angina: Secondary | ICD-10-CM | POA: Diagnosis not present

## 2023-04-12 DIAGNOSIS — Z1152 Encounter for screening for COVID-19: Secondary | ICD-10-CM | POA: Diagnosis not present

## 2023-04-12 DIAGNOSIS — J984 Other disorders of lung: Secondary | ICD-10-CM | POA: Diagnosis not present

## 2023-04-12 DIAGNOSIS — I081 Rheumatic disorders of both mitral and tricuspid valves: Secondary | ICD-10-CM | POA: Diagnosis not present

## 2023-04-12 DIAGNOSIS — I2510 Atherosclerotic heart disease of native coronary artery without angina pectoris: Secondary | ICD-10-CM | POA: Diagnosis not present

## 2023-04-12 DIAGNOSIS — J811 Chronic pulmonary edema: Secondary | ICD-10-CM | POA: Diagnosis not present

## 2023-04-12 DIAGNOSIS — E7800 Pure hypercholesterolemia, unspecified: Secondary | ICD-10-CM | POA: Diagnosis not present

## 2023-04-12 DIAGNOSIS — I509 Heart failure, unspecified: Secondary | ICD-10-CM | POA: Diagnosis not present

## 2023-04-12 DIAGNOSIS — R059 Cough, unspecified: Secondary | ICD-10-CM | POA: Diagnosis not present

## 2023-04-12 DIAGNOSIS — Z794 Long term (current) use of insulin: Secondary | ICD-10-CM | POA: Diagnosis not present

## 2023-04-12 DIAGNOSIS — Z7982 Long term (current) use of aspirin: Secondary | ICD-10-CM | POA: Diagnosis not present

## 2023-04-12 DIAGNOSIS — I130 Hypertensive heart and chronic kidney disease with heart failure and stage 1 through stage 4 chronic kidney disease, or unspecified chronic kidney disease: Secondary | ICD-10-CM | POA: Diagnosis not present

## 2023-04-12 DIAGNOSIS — E8770 Fluid overload, unspecified: Secondary | ICD-10-CM | POA: Diagnosis not present

## 2023-04-12 DIAGNOSIS — Z951 Presence of aortocoronary bypass graft: Secondary | ICD-10-CM | POA: Diagnosis not present

## 2023-04-12 DIAGNOSIS — I25810 Atherosclerosis of coronary artery bypass graft(s) without angina pectoris: Secondary | ICD-10-CM | POA: Diagnosis not present

## 2023-04-12 DIAGNOSIS — Q8901 Asplenia (congenital): Secondary | ICD-10-CM | POA: Diagnosis not present

## 2023-04-12 DIAGNOSIS — J90 Pleural effusion, not elsewhere classified: Secondary | ICD-10-CM | POA: Diagnosis not present

## 2023-04-12 DIAGNOSIS — D696 Thrombocytopenia, unspecified: Secondary | ICD-10-CM | POA: Diagnosis not present

## 2023-04-12 DIAGNOSIS — I25718 Atherosclerosis of autologous vein coronary artery bypass graft(s) with other forms of angina pectoris: Secondary | ICD-10-CM | POA: Diagnosis not present

## 2023-04-12 DIAGNOSIS — R600 Localized edema: Secondary | ICD-10-CM | POA: Diagnosis not present

## 2023-04-12 DIAGNOSIS — R9431 Abnormal electrocardiogram [ECG] [EKG]: Secondary | ICD-10-CM | POA: Diagnosis not present

## 2023-04-12 DIAGNOSIS — D649 Anemia, unspecified: Secondary | ICD-10-CM | POA: Diagnosis not present

## 2023-04-12 DIAGNOSIS — D631 Anemia in chronic kidney disease: Secondary | ICD-10-CM | POA: Diagnosis not present

## 2023-04-12 DIAGNOSIS — I25118 Atherosclerotic heart disease of native coronary artery with other forms of angina pectoris: Secondary | ICD-10-CM | POA: Diagnosis not present

## 2023-04-12 DIAGNOSIS — R42 Dizziness and giddiness: Secondary | ICD-10-CM | POA: Diagnosis not present

## 2023-04-12 DIAGNOSIS — Z20822 Contact with and (suspected) exposure to covid-19: Secondary | ICD-10-CM | POA: Diagnosis not present

## 2023-04-12 DIAGNOSIS — K8689 Other specified diseases of pancreas: Secondary | ICD-10-CM | POA: Diagnosis not present

## 2023-04-12 DIAGNOSIS — Z79890 Hormone replacement therapy (postmenopausal): Secondary | ICD-10-CM | POA: Diagnosis not present

## 2023-04-12 DIAGNOSIS — N184 Chronic kidney disease, stage 4 (severe): Secondary | ICD-10-CM | POA: Diagnosis not present

## 2023-04-12 DIAGNOSIS — M549 Dorsalgia, unspecified: Secondary | ICD-10-CM | POA: Diagnosis not present

## 2023-04-12 DIAGNOSIS — I088 Other rheumatic multiple valve diseases: Secondary | ICD-10-CM | POA: Diagnosis not present

## 2023-04-12 DIAGNOSIS — Z9861 Coronary angioplasty status: Secondary | ICD-10-CM | POA: Diagnosis not present

## 2023-04-12 DIAGNOSIS — Z681 Body mass index (BMI) 19 or less, adult: Secondary | ICD-10-CM | POA: Diagnosis not present

## 2023-04-12 DIAGNOSIS — E538 Deficiency of other specified B group vitamins: Secondary | ICD-10-CM | POA: Diagnosis not present

## 2023-04-12 DIAGNOSIS — R0789 Other chest pain: Secondary | ICD-10-CM | POA: Diagnosis not present

## 2023-04-13 DIAGNOSIS — E8770 Fluid overload, unspecified: Secondary | ICD-10-CM | POA: Diagnosis not present

## 2023-04-13 DIAGNOSIS — J811 Chronic pulmonary edema: Secondary | ICD-10-CM | POA: Diagnosis not present

## 2023-04-13 DIAGNOSIS — I088 Other rheumatic multiple valve diseases: Secondary | ICD-10-CM | POA: Diagnosis not present

## 2023-04-13 DIAGNOSIS — N184 Chronic kidney disease, stage 4 (severe): Secondary | ICD-10-CM | POA: Diagnosis not present

## 2023-04-13 DIAGNOSIS — J9601 Acute respiratory failure with hypoxia: Secondary | ICD-10-CM | POA: Diagnosis not present

## 2023-04-13 DIAGNOSIS — I25118 Atherosclerotic heart disease of native coronary artery with other forms of angina pectoris: Secondary | ICD-10-CM | POA: Diagnosis not present

## 2023-04-13 DIAGNOSIS — R079 Chest pain, unspecified: Secondary | ICD-10-CM | POA: Diagnosis not present

## 2023-04-13 DIAGNOSIS — J90 Pleural effusion, not elsewhere classified: Secondary | ICD-10-CM | POA: Diagnosis not present

## 2023-04-13 DIAGNOSIS — I272 Pulmonary hypertension, unspecified: Secondary | ICD-10-CM | POA: Diagnosis not present

## 2023-04-14 DIAGNOSIS — R778 Other specified abnormalities of plasma proteins: Secondary | ICD-10-CM | POA: Diagnosis not present

## 2023-04-14 DIAGNOSIS — N184 Chronic kidney disease, stage 4 (severe): Secondary | ICD-10-CM | POA: Diagnosis not present

## 2023-04-14 DIAGNOSIS — E8770 Fluid overload, unspecified: Secondary | ICD-10-CM | POA: Diagnosis not present

## 2023-04-14 DIAGNOSIS — R079 Chest pain, unspecified: Secondary | ICD-10-CM | POA: Diagnosis not present

## 2023-04-14 DIAGNOSIS — I25118 Atherosclerotic heart disease of native coronary artery with other forms of angina pectoris: Secondary | ICD-10-CM | POA: Diagnosis not present

## 2023-04-14 DIAGNOSIS — I501 Left ventricular failure: Secondary | ICD-10-CM | POA: Diagnosis not present

## 2023-04-15 ENCOUNTER — Telehealth: Payer: Self-pay | Admitting: Internal Medicine

## 2023-04-15 DIAGNOSIS — E8770 Fluid overload, unspecified: Secondary | ICD-10-CM | POA: Diagnosis not present

## 2023-04-15 DIAGNOSIS — N184 Chronic kidney disease, stage 4 (severe): Secondary | ICD-10-CM | POA: Diagnosis not present

## 2023-04-15 DIAGNOSIS — R9431 Abnormal electrocardiogram [ECG] [EKG]: Secondary | ICD-10-CM | POA: Diagnosis not present

## 2023-04-15 DIAGNOSIS — N179 Acute kidney failure, unspecified: Secondary | ICD-10-CM | POA: Diagnosis not present

## 2023-04-15 DIAGNOSIS — I25118 Atherosclerotic heart disease of native coronary artery with other forms of angina pectoris: Secondary | ICD-10-CM | POA: Diagnosis not present

## 2023-04-15 NOTE — Telephone Encounter (Signed)
[facility] called to advise PCP pt had been admitted to the hospital.

## 2023-04-15 NOTE — Telephone Encounter (Signed)
FYI-Pt has been readmitted.

## 2023-04-15 NOTE — Telephone Encounter (Signed)
Noted, thank you

## 2023-04-16 ENCOUNTER — Telehealth: Payer: Self-pay

## 2023-04-16 DIAGNOSIS — N184 Chronic kidney disease, stage 4 (severe): Secondary | ICD-10-CM | POA: Diagnosis not present

## 2023-04-16 DIAGNOSIS — Z951 Presence of aortocoronary bypass graft: Secondary | ICD-10-CM | POA: Diagnosis not present

## 2023-04-16 DIAGNOSIS — I25118 Atherosclerotic heart disease of native coronary artery with other forms of angina pectoris: Secondary | ICD-10-CM | POA: Diagnosis not present

## 2023-04-16 DIAGNOSIS — I2510 Atherosclerotic heart disease of native coronary artery without angina pectoris: Secondary | ICD-10-CM | POA: Diagnosis not present

## 2023-04-16 DIAGNOSIS — N179 Acute kidney failure, unspecified: Secondary | ICD-10-CM | POA: Diagnosis not present

## 2023-04-16 HISTORY — PX: LEFT HEART CATH: SHX5946

## 2023-04-16 NOTE — Telephone Encounter (Signed)
Physician orders (order number: [id number]) signed and faxed back to [facility] at [phone]. Form sent for scanning.

## 2023-04-17 DIAGNOSIS — Z951 Presence of aortocoronary bypass graft: Secondary | ICD-10-CM | POA: Diagnosis not present

## 2023-04-17 DIAGNOSIS — N184 Chronic kidney disease, stage 4 (severe): Secondary | ICD-10-CM | POA: Diagnosis not present

## 2023-04-17 DIAGNOSIS — I25810 Atherosclerosis of coronary artery bypass graft(s) without angina pectoris: Secondary | ICD-10-CM | POA: Diagnosis not present

## 2023-04-17 DIAGNOSIS — N179 Acute kidney failure, unspecified: Secondary | ICD-10-CM | POA: Diagnosis not present

## 2023-04-17 DIAGNOSIS — I25118 Atherosclerotic heart disease of native coronary artery with other forms of angina pectoris: Secondary | ICD-10-CM | POA: Diagnosis not present

## 2023-04-17 DIAGNOSIS — E43 Unspecified severe protein-calorie malnutrition: Secondary | ICD-10-CM | POA: Diagnosis not present

## 2023-04-17 DIAGNOSIS — R9431 Abnormal electrocardiogram [ECG] [EKG]: Secondary | ICD-10-CM | POA: Diagnosis not present

## 2023-04-17 HISTORY — PX: LEFT HEART CATH: SHX5946

## 2023-04-18 DIAGNOSIS — Z9861 Coronary angioplasty status: Secondary | ICD-10-CM | POA: Diagnosis not present

## 2023-04-18 DIAGNOSIS — N184 Chronic kidney disease, stage 4 (severe): Secondary | ICD-10-CM | POA: Diagnosis not present

## 2023-04-18 DIAGNOSIS — E43 Unspecified severe protein-calorie malnutrition: Secondary | ICD-10-CM | POA: Diagnosis not present

## 2023-04-18 DIAGNOSIS — I25118 Atherosclerotic heart disease of native coronary artery with other forms of angina pectoris: Secondary | ICD-10-CM | POA: Diagnosis not present

## 2023-04-19 DIAGNOSIS — I25118 Atherosclerotic heart disease of native coronary artery with other forms of angina pectoris: Secondary | ICD-10-CM | POA: Diagnosis not present

## 2023-04-19 DIAGNOSIS — Z9861 Coronary angioplasty status: Secondary | ICD-10-CM | POA: Diagnosis not present

## 2023-04-20 DIAGNOSIS — I2510 Atherosclerotic heart disease of native coronary artery without angina pectoris: Secondary | ICD-10-CM | POA: Diagnosis not present

## 2023-04-21 ENCOUNTER — Other Ambulatory Visit: Payer: Self-pay | Admitting: Internal Medicine

## 2023-04-22 ENCOUNTER — Telehealth: Payer: Self-pay | Admitting: *Deleted

## 2023-04-22 ENCOUNTER — Encounter: Payer: Self-pay | Admitting: *Deleted

## 2023-04-23 DIAGNOSIS — I2510 Atherosclerotic heart disease of native coronary artery without angina pectoris: Secondary | ICD-10-CM | POA: Diagnosis not present

## 2023-04-23 NOTE — Transitions of Care (Post Inpatient/ED Visit) (Addendum)
04/23/2023  Name: Ethan Mcneil MRN: [id number] DOB: [birth year]  Today's TOC FU Call Status: Today's TOC FU Call Status:: Successful TOC FU Call Competed TOC FU Call Complete Date: 04/22/23  Transition Care Management Follow-up Telephone Call Date of Discharge: 04/19/23 Discharge Facility: Other ([facility]) [facility] Name of Other ([facility]) Discharge Facility: [facility] Type of Discharge: Inpatient Admission Primary Inpatient Discharge Diagnosis:: shortness of breath/ chest pain How have you been since you were released from the hospital?: Better ("I am doing okay now that I am home; just really tired feeling.  I have an appointment with Dr. Drue Novel coming up- I am going to ask him if he thinks I need oxygen for all these family events I am planning on in the next few weeks") Any questions or concerns?: Yes Patient Questions/Concerns:: "I have a lot of family events coming up in the near future and am just so weak and have no appetite; do you think I need to have oxygen on hand for my upcoming trip to [place]?" Patient Questions/Concerns Addressed: Other: (discussed with patient multiple upcoming family events that include significant travel- made PCP aware of patient's question/ concerns and encouraged patient to discuss with PCP at time of upcoming office visit 04/25/23)  Items Reviewed: Did you receive and understand the discharge instructions provided?: Yes (thoroughly reviewed with patient who verbalizes good understanding of same) Medications obtained,verified, and reconciled?: Yes (Medications Reviewed) (Partial medication review completed; patient declines full review; confirmed patient obtained/ is taking all newly Rx'd medications as instructed; self-manages medications and denies questions/ concerns around medications today) Any new allergies since your discharge?: No Dietary orders reviewed?: Yes Type of Diet Ordered:: "Healthy" Do you have support at home?: Yes People in Home:  alone Name of Support/Comfort Primary Source: Reports independent in self-care activities and lives alone at baseline; sons assists as/ if needed/ indicated-- sons are currently living with patient temporarily after 2 recent hospitalizations  Medications Reviewed Today: Medications Reviewed Today     Reviewed by Michaela Corner, RN (Registered Nurse) on 04/23/23 at [phone]  Med List Status: <None>   Medication Order Taking? Sig Documenting Provider Last Dose Status Informant  acetaminophen (TYLENOL) 500 MG tablet [id number]  Take 500 mg by mouth every 4 (four) hours as needed for moderate pain. [provider]  Active Self, Child  amLODipine (NORVASC) 10 MG tablet [id number]  Take 1 tablet (10 mg total) by mouth daily. Wanda Plump, MD  Active   aspirin EC 81 MG tablet [id number]  Take 81 mg by mouth at bedtime. [provider]  Active Self, Child           Med Note Carola Rhine Jan 07, 2022  9:18 AM)    atorvastatin (LIPITOR) 80 MG tablet [id number]  Take 1 tablet (80 mg total) by mouth daily. Sharlene Dory, DO  Active   B-D UF III MINI PEN NEEDLES 31G X 5 MM MISC [id number]  USE 3 TIMES A DAY AS DIRECTED Romero Belling, MD  Active Self, Child  Cholecalciferol (VITAMIN D3) 50 MCG (2000 UT) capsule [id number]  Take 2,000 Units by mouth daily. [provider]  Active Self, Child  clopidogrel (PLAVIX) 75 MG tablet [id number]  Take 1 tablet (75 mg total) by mouth daily. Sharlene Dory, DO  Active   Continuous Blood Gluc Receiver (FREESTYLE LIBRE 2 READER) DEVI [id number]  Use as instructed to check blood sugar Carlus Pavlov, MD  Active   Continuous Blood  Gluc Sensor (FREESTYLE LIBRE 2 SENSOR) MISC [id number]  1 each by Does not apply route every 14 (fourteen) days. Carlus Pavlov, MD  Active   Cyanocobalamin (B-12) 1000 MCG TABS [id number]  Take 1,000 mcg by mouth daily. [provider]  Active Self, Child  FREESTYLE LITE test strip [id number]   CHECK BLOOD SUGAR NO MORE THAN TWICE DAILY. Romero Belling, MD  Active Self, Child  hydrALAZINE (APRESOLINE) 100 MG tablet [id number]  Take 100 mg by mouth 3 (three) times daily. [provider]  Active Self, Child  insulin aspart (FIASP) 100 UNIT/ML FlexTouch Pen [id number]  Inject 6-8 Units into the skin with breakfast, with lunch, and with evening meal. Carlus Pavlov, MD  Active   Insulin Glargine (BASAGLAR KWIKPEN) 100 UNIT/ML [id number]  Inject 10-12 Units into the skin daily. Carlus Pavlov, MD  Active   isosorbide mononitrate (IMDUR) 120 MG 24 hr tablet [id number]  Take 1 tablet (120 mg total) by mouth daily. Sharlene Dory, DO  Active   levothyroxine (SYNTHROID) 150 MCG tablet [id number]  Take 1 tablet (150 mcg total) by mouth daily before breakfast. Wanda Plump, MD  Active   metoprolol succinate (TOPROL-XL) 25 MG 24 hr tablet [id number]  Take 1 tablet (25 mg total) by mouth daily. Lanae Boast, MD  Active            Med Note Drue Novel, JOSE E   Tue Nov 26, 2022  8:28 AM) HOLD  HOLD restart if BP > 145/85  nitroGLYCERIN (NITROSTAT) 0.4 MG SL tablet [id number]  Place 1 tablet (0.4 mg total) under the tongue every 5 (five) minutes x 3 doses as needed for chest pain. Azalee Course, PA  Active   Pancrelipase, Lip-Prot-Amyl, (ZENPEP) 25000-79000 units CPEP [id number]  Take 2 capsules by mouth with every meal and snacks  Patient taking differently: Take 1 capsule by mouth daily at 12 noon.   Wanda Plump, MD  Active Self, Child  pantoprazole (PROTONIX) 40 MG tablet [id number]  Take 1 tablet (40 mg total) by mouth daily. Sharlene Dory, DO  Active   sodium bicarbonate 650 MG tablet [id number]  Take 650 mg by mouth 2 (two) times daily. [provider]  Active Self, Child  torsemide (DEMADEX) 20 MG tablet [id number] Yes Take 3 tablets (60 mg total) by mouth daily. Wanda Plump, MD Taking Active             Home Care and Equipment/Supplies: Were Home Health Services Ordered?:  Yes Name of Home Health Agency:: [facility]- per discharge notes to be resumed from previous hospital admission Has Agency set up a time to come to your home?: Yes First Home Health Visit Date:  ("Not sure my sons are handling") Any new equipment or medical supplies ordered?: No  Functional Questionnaire: Do you need assistance with bathing/showering or dressing?: Yes (sons currently assisting as needed/ indicated) Do you need assistance with meal preparation?: Yes (sons currently assisting as needed/ indicated) Do you need assistance with eating?: No Do you have difficulty maintaining continence: No Do you need assistance with getting out of bed/getting out of a chair/moving?: Yes (sons currently assisting as needed/ indicated) Do you have difficulty managing or taking your medications?: Yes (sons currently assisting as needed/ indicated)  Follow up appointments reviewed: PCP Follow-up appointment confirmed?: Yes Date of PCP follow-up appointment?: 04/25/23 Follow-up Provider: Dr. Drue Novel [facility] Follow-up appointment confirmed?: Yes Date of Specialist follow-up appointment?: 05/20/23 Follow-Up Specialty Provider:: cardiology Do you need transportation to  your follow-up appointment?: No Do you understand care options if your condition(s) worsen?: Yes-patient verbalized understanding  SDOH Interventions Today    Flowsheet Row Most Recent Value  SDOH Interventions   Food Insecurity Interventions Intervention Not Indicated  Transportation Interventions Intervention Not Indicated  [sons continue to provide transportation]      TOC Interventions Today    Flowsheet Row Most Recent Value  TOC Interventions   TOC Interventions Discussed/Reviewed TOC Interventions Discussed  [provided my direct contact information should questions/ concerns/ needs arise post-TOC call, prior to RN CM telephone visit  scheduled for 05/02/23]      Interventions Today    Flowsheet Row Most Recent  Value  Chronic Disease   Chronic disease during today's visit Other  [SOB/ CHF exacerbation/ chest pain]  General Interventions   General Interventions Discussed/Reviewed General Interventions Discussed, Doctor Visits, Referral to Nurse, Communication with, Durable Medical Equipment (DME)  Doctor Visits Discussed/Reviewed Specialist, Doctor Visits Discussed, PCP  Durable Medical Equipment (DME) Dan Humphreys, Other  [patient questions if he needs home O2 for his upcoming travel/ family events]  PCP/Specialist Visits Compliance with follow-up visit  Communication with PCP/Specialists, RN  Education Interventions   Provided Verbal Education On Other  [process to obtain oxygen/ need to discuss with PCP need for oxygen]  Nutrition Interventions   Nutrition Discussed/Reviewed Nutrition Discussed  Pharmacy Interventions   Pharmacy Dicussed/Reviewed Pharmacy Topics Discussed  Safety Interventions   Safety Discussed/Reviewed Safety Discussed      Caryl Pina, RN, BSN, CCRN Alumnus RN CM Care Coordination/ Transition of Care- [facility] Care Management [phone]: direct office

## 2023-04-25 ENCOUNTER — Ambulatory Visit (INDEPENDENT_AMBULATORY_CARE_PROVIDER_SITE_OTHER): Payer: Medicare HMO | Admitting: Internal Medicine

## 2023-04-25 ENCOUNTER — Encounter: Payer: Self-pay | Admitting: Internal Medicine

## 2023-04-25 VITALS — BP 126/72 | HR 60 | Temp 98.0°F | Resp 18 | Ht 70.0 in | Wt 133.5 lb

## 2023-04-25 DIAGNOSIS — I130 Hypertensive heart and chronic kidney disease with heart failure and stage 1 through stage 4 chronic kidney disease, or unspecified chronic kidney disease: Secondary | ICD-10-CM | POA: Diagnosis not present

## 2023-04-25 DIAGNOSIS — D649 Anemia, unspecified: Secondary | ICD-10-CM

## 2023-04-25 DIAGNOSIS — E038 Other specified hypothyroidism: Secondary | ICD-10-CM

## 2023-04-25 LAB — CBC WITH DIFFERENTIAL/PLATELET
Basophils Absolute: 0.2 10*3/uL — ABNORMAL HIGH (ref 0.0–0.1)
Basophils Relative: 2.9 % (ref 0.0–3.0)
Eosinophils Absolute: 0.5 10*3/uL (ref 0.0–0.7)
Eosinophils Relative: 6.9 % — ABNORMAL HIGH (ref 0.0–5.0)
HCT: 28.2 % — ABNORMAL LOW (ref 39.0–52.0)
Hemoglobin: 9.4 g/dL — ABNORMAL LOW (ref 13.0–17.0)
Lymphocytes Relative: 15.4 % (ref 12.0–46.0)
Lymphs Abs: 1.1 10*3/uL (ref 0.7–4.0)
MCHC: 33.4 g/dL (ref 30.0–36.0)
MCV: 95.3 fl (ref 78.0–100.0)
Monocytes Absolute: 0.8 10*3/uL (ref 0.1–1.0)
Monocytes Relative: 12 % (ref 3.0–12.0)
Neutro Abs: 4.3 10*3/uL (ref 1.4–7.7)
Neutrophils Relative %: 62.8 % (ref 43.0–77.0)
Platelets: 256 10*3/uL (ref 150.0–400.0)
RBC: 2.96 Mil/uL — ABNORMAL LOW (ref 4.22–5.81)
RDW: 17.4 % — ABNORMAL HIGH (ref 11.5–15.5)
WBC: 6.8 10*3/uL (ref 4.0–10.5)

## 2023-04-25 LAB — IBC + FERRITIN
Ferritin: 313.1 ng/mL (ref 22.0–322.0)
Iron: 57 ug/dL (ref 42–165)
Saturation Ratios: 24.7 % (ref 20.0–50.0)
TIBC: 231 ug/dL — ABNORMAL LOW (ref 250.0–450.0)
Transferrin: 165 mg/dL — ABNORMAL LOW (ref 212.0–360.0)

## 2023-04-25 LAB — COMPREHENSIVE METABOLIC PANEL
ALT: 6 U/L (ref 0–53)
AST: 11 U/L (ref 0–37)
Albumin: 3.7 g/dL (ref 3.5–5.2)
Alkaline Phosphatase: 61 U/L (ref 39–117)
BUN: 59 mg/dL — ABNORMAL HIGH (ref 6–23)
CO2: 26 mEq/L (ref 19–32)
Calcium: 8.3 mg/dL — ABNORMAL LOW (ref 8.4–10.5)
Chloride: 96 mEq/L (ref 96–112)
Creatinine, Ser: 3.34 mg/dL — ABNORMAL HIGH (ref 0.40–1.50)
GFR: 16.01 mL/min — ABNORMAL LOW (ref >60.00)
Glucose, Bld: 184 mg/dL — ABNORMAL HIGH (ref 70–99)
Potassium: 3.9 mEq/L (ref 3.5–5.1)
Sodium: 134 mEq/L — ABNORMAL LOW (ref 135–145)
Total Bilirubin: 0.7 mg/dL (ref 0.2–1.2)
Total Protein: 6.5 g/dL (ref 6.0–8.3)

## 2023-04-25 NOTE — Assessment & Plan Note (Signed)
CAD, CHF, chronic lower extremity edema: Since the last office visit was admitted to hospital twice, was discharged home May 4. Had a cardiac cath done according to the family 3 stents.  The patient reports that he is "finally getting better". Denies chest pain or difficulty breathing.  No edema. Has a follow-up with cardiology May 24.  Has an appointment with the local cardiologist in June Plan: CMP, CBC, iron panel. DM: Appetite is not good, patient has decreased a little bit his insulin doses. Currently on Basaglar 8 units daily and  rapid insulin 6 to 8 units with meals.  Ambulatory CBGs in the 120s, 140s. No change.  CBG goals: - early in AM fasting  ( blood sugar goal 100-140) - 2 hours after a meal (blood sugar goal less than 180) Anemia:  Hemoglobin 04/12/2023 at the time of admission was 8.0.  Was transfused while in the hospital, hemoglobin at time of discharge 04/19/2023: 8.0. will check a CBC and anemia panel. CKD: Checking labs, has follow-up with nephrologist May 14  Hypothyroidism: TSH 04/12/2023  >>> 2.6.  Continue present Synthroid RTC 2 months

## 2023-04-25 NOTE — Progress Notes (Signed)
Subjective:    Patient ID: Ethan Mcneil, male    DOB: Dec 12, 1934, 89 y.o.   MRN: [id number]  DOS:  04/25/2023 Type of visit - description: Hospital follow-up.  Here with his son.  Last seen by me  01/2023.  Was admitted to hospital 03/17/2023: Acute respiratory failure with hypoxia. Unstable angina.  Non-STEMI, medical management Eventually discharged home.    Admitted to the hospital again 04/12/2023, d/c home on May 4. Information about this admission obtained from the chart and from the patient.  Presented to the ER C/O SOB and CP, O2 sat 84%.    During the hospital admission received at least 1 PRBC, required continuous BiPAP initially, required aggressive diuresis, kidney function deteriorated, They report he had a cardiac catheterization and 3 stents were placed. Most recent labs: 04/19/2023: Potassium 4.8, creatinine 3.2, hemoglobin 8.0  Since he left the hospital he is at home under the care of his son. Currently with no chest pain, no SOB with normal activities. Edema is gone. Does not require oxygen. Appetite is still poor.   Review of Systems See above   Past Medical History:  Diagnosis Date   Anemia    Arthritis    CAD (coronary artery disease)    CABG 1997; grafts patent @ cath [facility] 11/09   DM2 (diabetes mellitus, type 2) (HCC)    Gout    after tick bite   History of kidney stones    HLD (hyperlipidemia)    HTN (hypertension)    essential nos   Hypothyroid    Pancreatic lesion    Postsurgical aortocoronary bypass status    PVD (peripheral vascular disease) (HCC)    S/P herniorrhaphy    Tick fever 2012   [facility] [place]   Unspecified hyperplasia of prostate without urinary obstruction and other lower urinary tract symptoms (LUTS)    Urolithiasis 2015   hx of   Wears dentures    Wears glasses     Past Surgical History:  Procedure Laterality Date   BIOPSY  08/07/2020   Procedure: BIOPSY;  Surgeon: Lemar Lofty., MD;   Location: [facility] ENDOSCOPY;  Service: Gastroenterology;;   CATARACT EXTRACTION Bilateral    CORONARY ARTERY BYPASS GRAFT     x7 vessels 1997; cath 2002 and 2009; grafts patent    CYSTOSCOPY WITH RETROGRADE PYELOGRAM, URETEROSCOPY AND STENT PLACEMENT Right 02/21/2014   Procedure: CYSTOSCOPY WITH RIGHT  RETROGRADE PYELOGRAM,RIGHT  URETEROSCOPY WITH STONE BASKETING EXTRACTION;  Surgeon: Bjorn Pippin, MD;  Location: [facility] ORS;  Service: Urology;  Laterality: Right;   ESOPHAGOGASTRODUODENOSCOPY (EGD) WITH PROPOFOL N/A 08/07/2020   Procedure: ESOPHAGOGASTRODUODENOSCOPY (EGD) WITH PROPOFOL;  Surgeon: Meridee Score Netty Starring., MD;  Location: [facility] ENDOSCOPY;  Service: Gastroenterology;  Laterality: N/A;   EUS N/A 08/07/2020   Procedure: UPPER ENDOSCOPIC ULTRASOUND (EUS) LINEAR;  Surgeon: Lemar Lofty., MD;  Location: [facility] ENDOSCOPY;  Service: Gastroenterology;  Laterality: N/A;   FINE NEEDLE ASPIRATION N/A 08/07/2020   Procedure: FINE NEEDLE ASPIRATION (FNA) LINEAR;  Surgeon: Lemar Lofty., MD;  Location: [facility] ENDOSCOPY;  Service: Gastroenterology;  Laterality: N/A;   HERNIA REPAIR     INGUINAL HERNIA REPAIR     LEFT HEART CATH  04/16/2023   LEFT HEART CATH  04/17/2023   MULTIPLE TOOTH EXTRACTIONS     SHOULDER SURGERY     SPLENECTOMY, TOTAL N/A 10/26/2020   Procedure: SPLENECTOMY;  Surgeon: Almond Lint, MD;  Location: [facility] OR;  Service: General;  Laterality: N/A;    Current  Outpatient Medications  Medication Instructions   acetaminophen (TYLENOL) 500 mg, Every 4 hours PRN   amLODipine (NORVASC) 10 mg, Oral, Daily   aspirin EC 81 mg, Oral, Daily at bedtime   atorvastatin (LIPITOR) 80 mg, Oral, Daily   B-12 1,000 mcg, Oral, Daily   B-D UF III MINI PEN NEEDLES 31G X 5 MM MISC USE 3 TIMES A DAY AS DIRECTED   Basaglar KwikPen 10-12 Units, Subcutaneous, Daily   clopidogrel (PLAVIX) 75 mg, Oral, Daily   Continuous Blood Gluc Receiver (FREESTYLE LIBRE 2 READER) DEVI Use as instructed to check  blood sugar   Continuous Blood Gluc Sensor (FREESTYLE LIBRE 2 SENSOR) MISC 1 each, Does not apply, Every 14 days   FREESTYLE LITE test strip CHECK BLOOD SUGAR NO MORE THAN TWICE DAILY.   hydrALAZINE (APRESOLINE) 100 mg, Oral, 3 times daily   insulin aspart (FIASP) 6-8 Units, Subcutaneous, 3 times daily with meals   isosorbide mononitrate (IMDUR) 120 mg, Oral, Daily   levothyroxine (SYNTHROID) 150 mcg, Oral, Daily before breakfast   metoprolol succinate (TOPROL-XL) 25 mg, Oral, Daily   nitroGLYCERIN (NITROSTAT) 0.4 mg, Sublingual, Every 5 min x3 PRN   Pancrelipase, Lip-Prot-Amyl, (ZENPEP) 25000-79000 units CPEP Take 2 capsules by mouth with every meal and snacks   pantoprazole (PROTONIX) 40 mg, Oral, Daily   sodium bicarbonate 650 mg, Oral, 2 times daily   torsemide (DEMADEX) 60 mg, Oral, Daily   Vitamin D3 2,000 Units, Oral, Daily       Objective:   Physical Exam BP 126/72   Pulse 60   Temp 98 F (36.7 C) (Oral)   Resp 18   Ht 5\' 10"  (1.778 m)   Wt 133 lb 8 oz (60.6 kg)   SpO2 95%   BMI 19.16 kg/m  General:   Well developed, NAD, BMI noted. HEENT:  Normocephalic . Face symmetric, atraumatic Lungs:  CTA B Normal respiratory effort, no intercostal retractions, no accessory muscle use. Heart: RRR,  no murmur.  Lower extremities: no pretibial edema bilaterally  Skin: Not pale. Not jaundice Neurologic:  alert & oriented X3.  Speech normal, gait appropriate for age and unassisted Psych--  Cognition and judgment appear intact.  Cooperative with normal attention span and concentration.  Behavior appropriate. No anxious or depressed appearing.      Assessment     Assessment DM -- w/ CAD, no neuropathy, intolerant to metformin , sees ENDO HTN Hyperkalemia-- off  ACEs Hyperlipidemia Hypothyroidism DJD BPH, LUTS,h/o urolithiasis -- sees urology CKD: started to see renal 06/2020 Anemia  CV: --CAD, CABG 1997, cardiac cath 2009 --PVD (no ABIs in the chart) --L leg  edema, chronic, (-) DVT per [place] 2015 -- CHF Gout GI: Pancreatic insufficiency dx 02-2017 B 12 deficiency, mild 2015  Wt loss:  2012 187 lb, 2014 173 lb , 2015 167 01-2017: EGD normal, BX chronic active gastritis. Colonoscopy normal. Saw GI 02-2017: likely d/t Pancreatic insufficiency and poorly controlled diabetes. 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  PNM 20: 09-2021 -Meningococcal: #1 today 11-02-21, #2 02/2022. Boost q 5 years -meningococcal B: 02/2022 and 06-04-2022 - haemophilus influenza:   11-02-21. Completed   PLAN:  CAD, CHF, chronic lower extremity edema: Since the last office visit was admitted to hospital twice, was discharged home May 4. Had a cardiac cath  done according to the family 3 stents.  The patient reports that he is "finally getting better". Denies chest pain or difficulty breathing.  No edema. Has a follow-up with cardiology May 24.  Has an appointment with the local cardiologist in June Plan: CMP, CBC, iron panel. DM: Appetite is not good, patient has decreased a little bit his insulin doses. Currently on Basaglar 8 units daily and  rapid insulin 6 to 8 units with meals.  Ambulatory CBGs in the 120s, 140s. No change.  CBG goals: - early in AM fasting  ( blood sugar goal 100-140) - 2 hours after a meal (blood sugar goal less than 180) Anemia:  Hemoglobin 04/12/2023 at the time of admission was 8.0.  Was transfused while in the hospital, hemoglobin at time of discharge 04/19/2023: 8.0. will check a CBC and anemia panel. CKD: Checking labs, has follow-up with nephrologist May 14  Hypothyroidism: TSH 04/12/2023  >>> 2.6.  Continue present Synthroid RTC 2 months

## 2023-04-25 NOTE — Patient Instructions (Addendum)
Continue same medications  Keep the appointment with all the specialist   Weight yourself daily, if your weight is start increasing please reach out to cardiology  Diabetes:   - early in AM fasting  ( blood sugar goal 100-140) - 2 hours after a meal (blood sugar goal less than 180)     Check the  blood pressure regularly BP GOAL is between 110/65 and  135/85. If it is consistently higher or lower, let me know    GO TO THE LAB : Get the blood work     GO TO THE FRONT DESK, PLEASE SCHEDULE YOUR APPOINTMENTS Come back for   a checkup in 2 months   Per our records you are due for your diabetic eye exam. Please contact your eye doctor to schedule an appointment. Please have them send copies of your office visit notes to [place]. Our fax number is [phone]. If you need a referral to an eye doctor please let us know.

## 2023-04-28 ENCOUNTER — Ambulatory Visit: Payer: Medicare HMO | Admitting: Student

## 2023-04-29 DIAGNOSIS — I2510 Atherosclerotic heart disease of native coronary artery without angina pectoris: Secondary | ICD-10-CM | POA: Diagnosis not present

## 2023-04-29 DIAGNOSIS — N189 Chronic kidney disease, unspecified: Secondary | ICD-10-CM | POA: Diagnosis not present

## 2023-04-29 DIAGNOSIS — N184 Chronic kidney disease, stage 4 (severe): Secondary | ICD-10-CM | POA: Diagnosis not present

## 2023-04-29 DIAGNOSIS — E559 Vitamin D deficiency, unspecified: Secondary | ICD-10-CM | POA: Diagnosis not present

## 2023-04-29 DIAGNOSIS — N2581 Secondary hyperparathyroidism of renal origin: Secondary | ICD-10-CM | POA: Diagnosis not present

## 2023-04-29 DIAGNOSIS — I129 Hypertensive chronic kidney disease with stage 1 through stage 4 chronic kidney disease, or unspecified chronic kidney disease: Secondary | ICD-10-CM | POA: Diagnosis not present

## 2023-04-29 DIAGNOSIS — R600 Localized edema: Secondary | ICD-10-CM | POA: Diagnosis not present

## 2023-04-29 DIAGNOSIS — D631 Anemia in chronic kidney disease: Secondary | ICD-10-CM | POA: Diagnosis not present

## 2023-04-29 DIAGNOSIS — E872 Acidosis, unspecified: Secondary | ICD-10-CM | POA: Diagnosis not present

## 2023-04-29 LAB — CBC AND DIFFERENTIAL
HCT: 28 — AB (ref 41–53)
Hemoglobin: 9.2 — AB (ref 13.5–17.5)
Neutrophils Absolute: 4.3
Platelets: 313 10*3/uL (ref 150–400)
WBC: 7.4

## 2023-04-29 LAB — COMPREHENSIVE METABOLIC PANEL
Albumin: 3.8 (ref 3.5–5.0)
Calcium: 8.6 — AB (ref 8.7–10.7)
eGFR: 18

## 2023-04-29 LAB — IRON,TIBC AND FERRITIN PANEL
%SAT: 14
Ferritin: 602
Iron: 31
TIBC: 218
UIBC: 187

## 2023-04-29 LAB — BASIC METABOLIC PANEL
BUN: 45 — AB (ref 4–21)
CO2: 23 — AB (ref 13–22)
Chloride: 95 — AB (ref 99–108)
Creatinine: 3.2 — AB (ref 0.6–1.3)
Glucose: 160
Potassium: 4.5 mEq/L (ref 3.5–5.1)
Sodium: 134 — AB (ref 137–147)

## 2023-04-29 LAB — VITAMIN D 25 HYDROXY (VIT D DEFICIENCY, FRACTURES): Vit D, 25-Hydroxy: 24.4

## 2023-04-29 LAB — CBC: RBC: 3.01 — AB (ref 3.87–5.11)

## 2023-05-01 ENCOUNTER — Telehealth: Payer: Self-pay | Admitting: Cardiology

## 2023-05-01 NOTE — Telephone Encounter (Signed)
Pt c/o medication issue:  1. Name of Medication: atorvastatin (LIPITOR) 80 MG tablet  metoprolol succinate (TOPROL-XL) 25 MG 24 hr tablet   clopidogrel (PLAVIX) 75 MG tablet   2. How are you currently taking this medication (dosage and times per day)? As prescribed   3. Are you having a reaction (difficulty breathing--STAT)?   4. What is your medication issue? Patient's son states that these medications should be filled by cardiologist and not sure how this got switch to the PCP. Requesting Dr. Antoine Poche to continue to fill this.

## 2023-05-02 ENCOUNTER — Other Ambulatory Visit: Payer: Self-pay

## 2023-05-02 ENCOUNTER — Telehealth: Payer: Self-pay

## 2023-05-02 MED ORDER — METOPROLOL SUCCINATE ER 25 MG PO TB24: 25.0000 mg | ORAL_TABLET | Freq: Every day | ORAL | 0 refills | Status: DC

## 2023-05-02 MED ORDER — ATORVASTATIN CALCIUM 80 MG PO TABS: 80.0000 mg | ORAL_TABLET | Freq: Every day | ORAL | 3 refills | Status: DC

## 2023-05-02 MED ORDER — CLOPIDOGREL BISULFATE 75 MG PO TABS: 75.0000 mg | ORAL_TABLET | Freq: Every day | ORAL | 1 refills | Status: DC

## 2023-05-02 NOTE — Telephone Encounter (Signed)
Plan of care signed and faxed back to [facility] at [phone]. Form sent for scanning.

## 2023-05-02 NOTE — Telephone Encounter (Signed)
Patient's son is following up regarding refill request. He states the patient is completely out of medication.  Please send to the pharmacy below: [id number] - [place], [place] - [address]

## 2023-05-05 ENCOUNTER — Encounter: Payer: Self-pay | Admitting: Internal Medicine

## 2023-05-06 ENCOUNTER — Telehealth: Payer: Self-pay

## 2023-05-06 NOTE — Patient Outreach (Signed)
  Care Coordination   05/06/2023 Name: Ethan Mcneil MRN: [id number] DOB: 1934/07/27   Care Coordination Outreach Attempts:  An unsuccessful telephone outreach was attempted for a scheduled appointment today.  Follow Up Plan:  Additional outreach attempts will be made to offer the patient care coordination information and services.   Encounter Outcome:  No Answer   Care Coordination Interventions:  No, not indicated    Kathyrn Sheriff, RN, MSN, BSN, CCM [facility] Care Coordinator [phone]

## 2023-05-07 ENCOUNTER — Telehealth: Payer: Self-pay

## 2023-05-07 ENCOUNTER — Telehealth: Payer: Self-pay | Admitting: Internal Medicine

## 2023-05-07 ENCOUNTER — Telehealth: Payer: Self-pay | Admitting: Cardiology

## 2023-05-07 MED ORDER — ATORVASTATIN CALCIUM 80 MG PO TABS: 80.0000 mg | ORAL_TABLET | Freq: Every day | ORAL | 3 refills | Status: DC

## 2023-05-07 MED ORDER — CLOPIDOGREL BISULFATE 75 MG PO TABS: 75.0000 mg | ORAL_TABLET | Freq: Every day | ORAL | 1 refills | Status: DC

## 2023-05-07 NOTE — Telephone Encounter (Signed)
Just an FYI

## 2023-05-07 NOTE — Telephone Encounter (Signed)
Ethan Mcneil with [facility] called to report missed appt for evaluation. Pt will be in [place] until Saturday so evaluation will be pushed back until next week.

## 2023-05-07 NOTE — Telephone Encounter (Signed)
Pt c/o medication issue:  1. Name of Medication:  atorvastatin (LIPITOR) 80 MG tablet  clopidogrel (PLAVIX) 75 MG tablet    2. How are you currently taking this medication (dosage and times per day)?   3. Are you having a reaction (difficulty breathing--STAT)?   4. What is your medication issue? Patient son called stating his father is completely out of these medications, he called last week requesting a refill for these. Please send to [id number] - [place], [place] - [address]   Son would like a call when this has been done.

## 2023-05-07 NOTE — Telephone Encounter (Signed)
Prescriptions show print rather than fax.  Both sent and son notified.

## 2023-05-07 NOTE — Telephone Encounter (Signed)
Physician orders (order number: [id number]) signed and faxed back to [facility] at [phone]. Form sent for scanning.

## 2023-05-09 ENCOUNTER — Telehealth: Payer: Self-pay

## 2023-05-09 NOTE — Telephone Encounter (Signed)
Physician orders (order number: [id number]) signed and faxed back to [facility] at [phone]. Form sent for scanning.

## 2023-05-15 DIAGNOSIS — N184 Chronic kidney disease, stage 4 (severe): Secondary | ICD-10-CM | POA: Diagnosis not present

## 2023-05-15 DIAGNOSIS — D631 Anemia in chronic kidney disease: Secondary | ICD-10-CM | POA: Diagnosis not present

## 2023-05-16 ENCOUNTER — Telehealth: Payer: Self-pay | Admitting: *Deleted

## 2023-05-16 ENCOUNTER — Telehealth: Payer: Self-pay | Admitting: Cardiology

## 2023-05-16 DIAGNOSIS — J189 Pneumonia, unspecified organism: Secondary | ICD-10-CM | POA: Diagnosis not present

## 2023-05-16 DIAGNOSIS — I071 Rheumatic tricuspid insufficiency: Secondary | ICD-10-CM | POA: Diagnosis not present

## 2023-05-16 DIAGNOSIS — I2510 Atherosclerotic heart disease of native coronary artery without angina pectoris: Secondary | ICD-10-CM | POA: Diagnosis not present

## 2023-05-16 DIAGNOSIS — I509 Heart failure, unspecified: Secondary | ICD-10-CM | POA: Diagnosis not present

## 2023-05-16 DIAGNOSIS — I739 Peripheral vascular disease, unspecified: Secondary | ICD-10-CM | POA: Diagnosis not present

## 2023-05-16 DIAGNOSIS — E785 Hyperlipidemia, unspecified: Secondary | ICD-10-CM | POA: Diagnosis not present

## 2023-05-16 DIAGNOSIS — I160 Hypertensive urgency: Secondary | ICD-10-CM | POA: Diagnosis not present

## 2023-05-16 MED ORDER — HYDRALAZINE HCL 100 MG PO TABS: 100.0000 mg | ORAL_TABLET | Freq: Three times a day (TID) | ORAL | 3 refills | Status: DC

## 2023-05-16 NOTE — Telephone Encounter (Signed)
*  STAT* If patient is at the pharmacy, call can be transferred to refill team.   1. Which medications need to be refilled? (please list name of each medication and dose if known)   hydrALAZINE (APRESOLINE) 100 MG tablet   2. Which pharmacy/location (including street and city if local pharmacy) is medication to be sent to?   [id number] - [place], [place] - [address]   3. Do they need a 30 day or 90 day supply?   90 day  Patient is completely out of this medication.

## 2023-05-16 NOTE — Progress Notes (Signed)
  Care Coordination Note  05/16/2023 Name: Ethan Mcneil MRN: [id number] DOB: Jan 16, 1934  Ethan Mcneil is a 89 y.o. year old male who is a primary care patient of Wanda Plump, MD and is actively engaged with the care management team. I reached out to [name] by phone today to assist with re-scheduling an initial visit with the RN Case Manager  Follow up plan: Unsuccessful telephone outreach attempt made. A HIPAA compliant phone message was left for the patient providing contact information and requesting a return call.   Burman Nieves, CCMA Care Coordination Care Guide Direct Dial: [phone]

## 2023-05-16 NOTE — Telephone Encounter (Signed)
Spoke with nicki, Refill sent to the pharmacy electronically.

## 2023-05-16 NOTE — Progress Notes (Signed)
  Care Coordination Note  05/16/2023 Name: Ethan Mcneil MRN: [id number] DOB: January 28, 1934  Ethan Mcneil is a 89 y.o. year old male who is a primary care patient of Wanda Plump, MD and is actively engaged with the care management team. I reached out to [name] by phone today to assist with re-scheduling an initial visit with the RN Case Manager  Follow up plan: Telephone appointment with care management team member scheduled for: 05/28/2023  Burman Nieves, [facility] Care Coordination Care Guide Direct Dial: [phone]

## 2023-05-16 NOTE — Telephone Encounter (Signed)
Pt is requesting a refill on hydralazine. Dr. Antoine Poche did not prescribe this medication. Would Dr. Antoine Poche like to refill this medication? Please address

## 2023-05-20 ENCOUNTER — Ambulatory Visit: Payer: Medicare HMO | Admitting: Physician Assistant

## 2023-05-20 ENCOUNTER — Ambulatory Visit: Payer: Medicare HMO | Attending: Physician Assistant | Admitting: Physician Assistant

## 2023-05-20 ENCOUNTER — Encounter: Payer: Self-pay | Admitting: Physician Assistant

## 2023-05-20 VITALS — BP 126/54 | HR 66 | Ht 70.0 in | Wt 136.4 lb

## 2023-05-20 DIAGNOSIS — I5022 Chronic systolic (congestive) heart failure: Secondary | ICD-10-CM

## 2023-05-20 DIAGNOSIS — I25810 Atherosclerosis of coronary artery bypass graft(s) without angina pectoris: Secondary | ICD-10-CM

## 2023-05-20 DIAGNOSIS — I10 Essential (primary) hypertension: Secondary | ICD-10-CM

## 2023-05-20 DIAGNOSIS — E785 Hyperlipidemia, unspecified: Secondary | ICD-10-CM

## 2023-05-20 DIAGNOSIS — N184 Chronic kidney disease, stage 4 (severe): Secondary | ICD-10-CM

## 2023-05-20 NOTE — Progress Notes (Unsigned)
Cardiology Office Note:    Date:  05/20/2023   ID:  TRAD TIMPE, DOB 02-04-1934, MRN [id number]  PCP:  Wanda Plump, MD   [facility] Providers Cardiologist:  Rollene Rotunda, MD { Click to update primary MD,subspecialty MD or APP then REFRESH:1}    Referring MD: Wanda Plump, MD   No chief complaint on file. ***  History of Present Illness:    Ethan Mcneil is a 89 y.o. 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  Last cardiac catheterization in 2009 revealed patent grafts.   Patient was seen previously in the emergency room for shortness of breath in December 2022.  BNP at the time was 1900.  Creatinine 2.5.  He received 40 mg IV Lasix which made him feel better.  He was seen by Dr. Anne Fu on 12/28/2021, he was felt to be massively volume overloaded at the time.  Patient was directly admitted to the hospital.  Echocardiogram obtained on 12/29/2021 showed EF 55%, mild LVH, grade 2 DD, RVSP 44.2 mmHg, mild to moderate MR, moderate to severe TR. he was admitted for acute on chronic heart failure and community-acquired pneumonia in April 2023.  Metoprolol was decreased due to slow heart rate.  He was discharged home on torsemide.    He was seen by Dr. Antoine Poche in June 2023, he was found to be volume overloaded, he was encouraged to take additional dose of torsemide for 3 days before going back to the previous dose.  He went to [facility] ED in March 2024 with chest pain and progressive dyspnea and orthopnea.  He was found to be volume overloaded requiring BiPAP therapy.  He was subsequently transferred to [facility] and treated for acute hypoxic respiratory failure and acute on chronic diastolic heart failure.  BNP was elevated at 2231.  Serial troponin was 163-->906.  Echocardiogram obtained on 03/17/2023 demonstrated EF 35%.  With IV diuresis, creatinine trended up to 3.87 before trending back down to  3.22 on discharge.  Chest pain and elevated troponin was managed medically given advanced stage IV CKD and a high risk for contrast nephropathy.  He had anemia with baseline hemoglobin 9--10 during hospitalization.  Hospital course complicated by UTI treated with ceftriaxone and discharged on renally dosed cefdinir for 6 days.  Since the last visit, patient returned to the hospital on 04/12/2023, BNP was greater than 30,000.  Chest x-ray showed bilateral pleural effusion.  He underwent aggressive IV diuresis.  Echocardiogram showed EF has reduced further down to 25%, moderate MR, moderate TR, moderately reduced RVEF.  Due to intermittent chest pain, patient underwent cardiac catheterization on 04/16/2023 and had successful PCI of vein graft on 5/2.  He was discharged on 60 mg daily of torsemide.  His creatinine on discharge was 3.27.   He was seen by Dr. Jacquelyne Balint of [facility] cardiology service on 05/16/2023 at which time he was doing well.  His 60 mg daily of torsemide was reportedly reduced down to 40 mg daily at that time.  He has not seen any significant weight gain.  Since stent placement.  He has not had any major chest discomfort.  He has no lower extremity edema, orthopnea or PND.  Overall, he is doing well from the cardiac perspective.  Last blood work obtained in May showed stable renal function at 3.2.  I recommended continue on the current therapy and follow-up with  Dr. Antoine Poche as previously scheduled in August.    Past Medical History:  Diagnosis Date   Anemia    Arthritis    CAD (coronary artery disease)    CABG 1997; grafts patent @ cath [facility] 11/09   DM2 (diabetes mellitus, type 2) (HCC)    Gout    after tick bite   History of kidney stones    HLD (hyperlipidemia)    HTN (hypertension)    essential nos   Hypothyroid    Pancreatic lesion    Postsurgical aortocoronary bypass status    PVD (peripheral vascular disease) (HCC)    S/P herniorrhaphy    Tick fever 2012   [facility] , [place]   Unspecified hyperplasia of prostate without urinary obstruction and other lower urinary tract symptoms (LUTS)    Urolithiasis 2015   hx of   Wears dentures    Wears glasses     Past Surgical History:  Procedure Laterality Date   BIOPSY  08/07/2020   Procedure: BIOPSY;  Surgeon: Lemar Lofty., MD;  Location: [facility] ENDOSCOPY;  Service: Gastroenterology;;   CATARACT EXTRACTION Bilateral    CORONARY ARTERY BYPASS GRAFT     x7 vessels 1997; cath 2002 and 2009; grafts patent    CYSTOSCOPY WITH RETROGRADE PYELOGRAM, URETEROSCOPY AND STENT PLACEMENT Right 02/21/2014   Procedure: CYSTOSCOPY WITH RIGHT  RETROGRADE PYELOGRAM,RIGHT  URETEROSCOPY WITH STONE BASKETING EXTRACTION;  Surgeon: Bjorn Pippin, MD;  Location: [facility] ORS;  Service: Urology;  Laterality: Right;   ESOPHAGOGASTRODUODENOSCOPY (EGD) WITH PROPOFOL N/A 08/07/2020   Procedure: ESOPHAGOGASTRODUODENOSCOPY (EGD) WITH PROPOFOL;  Surgeon: Meridee Score Netty Starring., MD;  Location: [facility] ENDOSCOPY;  Service: Gastroenterology;  Laterality: N/A;   EUS N/A 08/07/2020   Procedure: UPPER ENDOSCOPIC ULTRASOUND (EUS) LINEAR;  Surgeon: Lemar Lofty., MD;  Location: [facility] ENDOSCOPY;  Service: Gastroenterology;  Laterality: N/A;   FINE NEEDLE ASPIRATION N/A 08/07/2020   Procedure: FINE NEEDLE ASPIRATION (FNA) LINEAR;  Surgeon: Lemar Lofty., MD;  Location: [facility] ENDOSCOPY;  Service: Gastroenterology;  Laterality: N/A;   HERNIA REPAIR     INGUINAL HERNIA REPAIR     LEFT HEART CATH  04/16/2023   LEFT HEART CATH  04/17/2023   MULTIPLE TOOTH EXTRACTIONS     SHOULDER SURGERY     SPLENECTOMY, TOTAL N/A 10/26/2020   Procedure: SPLENECTOMY;  Surgeon: Almond Lint, MD;  Location: [facility] OR;  Service: General;  Laterality: N/A;    Current Medications: Current Meds  Medication Sig   acetaminophen (TYLENOL) 500 MG tablet Take 500 mg by mouth every 4 (four) hours as needed for moderate pain.   amLODipine (NORVASC) 10 MG  tablet Take 1 tablet (10 mg total) by mouth daily.   aspirin EC 81 MG tablet Take 81 mg by mouth at bedtime.   atorvastatin (LIPITOR) 80 MG tablet Take 1 tablet (80 mg total) by mouth daily.   B-D UF III MINI PEN NEEDLES 31G X 5 MM MISC USE 3 TIMES A DAY AS DIRECTED   Cholecalciferol (VITAMIN D3) 50 MCG (2000 UT) capsule Take 2,000 Units by mouth daily.   clopidogrel (PLAVIX) 75 MG tablet Take 1 tablet (75 mg total) by mouth daily.   Continuous Blood Gluc Receiver (FREESTYLE LIBRE 2 READER) DEVI Use as instructed to check blood sugar   Continuous Blood Gluc Sensor (FREESTYLE LIBRE 2 SENSOR) MISC 1 each by Does not apply route every 14 (fourteen) days.   Cyanocobalamin (B-12) 1000 MCG TABS Take 1,000 mcg by mouth daily.   FREESTYLE LITE  test strip CHECK BLOOD SUGAR NO MORE THAN TWICE DAILY.   hydrALAZINE (APRESOLINE) 100 MG tablet Take 1 tablet (100 mg total) by mouth 3 (three) times daily.   insulin aspart (FIASP) 100 UNIT/ML FlexTouch Pen Inject 6-8 Units into the skin with breakfast, with lunch, and with evening meal.   Insulin Glargine (BASAGLAR KWIKPEN) 100 UNIT/ML Inject 10-12 Units into the skin daily. (Patient taking differently: Inject 6 Units into the skin daily.)   isosorbide mononitrate (IMDUR) 120 MG 24 hr tablet Take 1 tablet (120 mg total) by mouth daily.   levothyroxine (SYNTHROID) 150 MCG tablet Take 1 tablet (150 mcg total) by mouth daily before breakfast.   metoprolol succinate (TOPROL-XL) 25 MG 24 hr tablet Take 1 tablet (25 mg total) by mouth daily.   nitroGLYCERIN (NITROSTAT) 0.4 MG SL tablet Place 1 tablet (0.4 mg total) under the tongue every 5 (five) minutes x 3 doses as needed for chest pain.   Pancrelipase, Lip-Prot-Amyl, (ZENPEP) 25000-79000 units CPEP Take 2 capsules by mouth with every meal and snacks (Patient taking differently: Take 1 capsule by mouth daily at 12 noon.)   pantoprazole (PROTONIX) 40 MG tablet Take 1 tablet (40 mg total) by mouth daily.   sodium  bicarbonate 650 MG tablet Take 650 mg by mouth 2 (two) times daily.   torsemide (DEMADEX) 20 MG tablet Take 40 mg by mouth daily.     Allergies:   Ciprofloxacin and Codeine   Social History   Socioeconomic History   Marital status: Married    Spouse name: Pat   Number of children: 4   Years of education: Not on file   Highest education level: Not on file  Occupational History   Occupation: retired, works few hours   Tobacco Use   Smoking status: Never   Smokeless tobacco: Never  Vaping Use   Vaping Use: Never used  Substance and Sexual Activity   Alcohol use: No   Drug use: No   Sexual activity: Not Currently  Other Topics Concern   Not on file  Social History Narrative   Lives alone   Drives      Patient Curator; aviator and former race car driver   4 children (boys)      Social Determinants of Health   Financial Resource Strain: Low Risk  (07/16/2022)   Overall Financial Resource Strain (CARDIA)    Difficulty of Paying Living Expenses: Not hard at all  Food Insecurity: No Food Insecurity (04/22/2023)   Hunger Vital Sign    Worried About Running Out of Food in the Last Year: Never true    Ran Out of Food in the Last Year: Never true  Transportation Needs: No Transportation Needs (04/22/2023)   PRAPARE - Administrator, Civil Service (Medical): No    Lack of Transportation (Non-Medical): No  Physical Activity: Insufficiently Active (07/16/2022)   Exercise Vital Sign    Days of Exercise per Week: 5 days    Minutes of Exercise per Session: 20 min  Stress: No Stress Concern Present (07/16/2022)   [employer] of Occupational Health - Occupational Stress Questionnaire    Feeling of Stress : Not at all  Social Connections: Moderately Isolated (07/16/2022)   Social Connection and Isolation Panel [NHANES]    Frequency of Communication with Friends and Family: More than three times a week    Frequency of Social Gatherings with Friends and Family: More than three  times a week    Attends Religious Services: More  than 4 times per year    Active Member of Clubs or Organizations: No    Attends Banker Meetings: Never    Marital Status: Widowed     Family History: The patient's ***family history includes Coronary artery disease in his mother; Diabetes in his brother; Lung cancer in his father; Prostate cancer in his maternal uncle; Stroke in his son; Throat cancer in his son. There is no history of Colon cancer, Rectal cancer, Stomach cancer, Esophageal cancer, or Pancreatic cancer.  ROS:   Please see the history of present illness.    *** All other systems reviewed and are negative.  EKGs/Labs/Other Studies Reviewed:    The following studies were reviewed today:  Echo 04/13/2023 Summary   1. The left ventricle is moderately dilated in size with normal wall  thickness.   2. The left ventricular systolic function is severely decreased, LVEF is  visually estimated at 25%.    3. There is thinning and akinesis of the inferior/posterior wall.    4. Mitral annular calcification is present (moderate).    5. The mitral valve leaflets are mildly thickened with normal leaflet  mobility.   6. There is moderate mitral valve regurgitation.    7. The aortic valve is probably trileaflet with moderately thickened  leaflets with moderately reduced excursion.    8. The left atrium is moderately dilated in size.    9. There is moderate pulmonary hypertension.    10. The right ventricle is mildly dilated in size, with moderately reduced  systolic function.    11. There is moderate tricuspid regurgitation.    12. The right atrium is mildly dilated in size.    Cath 04/16/2023 Findings:  Severe native coronary artery disease.  Patent LIMA-LAD graft, patent SVG-RCA, and patent SVG-D2  Patent SVG jump graft (D1-OM1-OM2-distal Lcx) with 95-99% stenosis just  distal to OM2  anastamosis   Normal/high-normal left ventricular filling pressures (LVEDP = 14  mm Hg).   Recommendations:  Will attempt PCI at SVG jump graft on 5/2   Cath 04/17/2023 CONCLUSIONS:  - S/p ultrasound guided right femoral artery access  - Successful PCI/DES to the SVG to the OM body with placement of three 3  mm x 18 mm Xience drug eluting stents with excellent angiographic result  and TIMI 3 flow    RECOMMENDATIONS:  - Femoral access protocol  - Optimize medical Rx  - Risk factor modification  - Dual antiplatelet therapy with aspirin and clopidogrel for at least 12  months, ideally longer  - Ongoing management of ACS by inpatient service    EKG:  EKG is *** ordered today.  The ekg ordered today demonstrates ***  Recent Labs: 11/26/2022: TSH 3.22 04/25/2023: ALT 6 04/29/2023: BUN 45; Creatinine 3.2; Hemoglobin 9.2; Platelets 313; Potassium 4.5; Sodium 134  Recent Lipid Panel    Component Value Date/Time   CHOL 116 04/22/2022 0907   TRIG 69 04/22/2022 0907   HDL 57 04/22/2022 0907   CHOLHDL 2.0 04/22/2022 0907   CHOLHDL 2 09/25/2021 0901   VLDL 17.4 09/25/2021 0901   LDLCALC 45 04/22/2022 0907     Risk Assessment/Calculations:   {Does this patient have ATRIAL FIBRILLATION?:[phone]}       Physical Exam:    VS:  BP (!) 126/54   Pulse 66   Ht 5\' 10"  (1.778 m)   Wt 136 lb 6.4 oz (61.9 kg)   SpO2 97%   BMI 19.57 kg/m  Wt Readings from Last 3 Encounters:  05/20/23 136 lb 6.4 oz (61.9 kg)  04/25/23 133 lb 8 oz (60.6 kg)  04/09/23 147 lb 6.4 oz (66.9 kg)     GEN: *** Well nourished, well developed in no acute distress HEENT: Normal NECK: No JVD; No carotid bruits LYMPHATICS: No lymphadenopathy CARDIAC: ***RRR, no murmurs, rubs, gallops RESPIRATORY:  Clear to auscultation without rales, wheezing or rhonchi  ABDOMEN: Soft, non-tender, non-distended MUSCULOSKELETAL:  No edema; No deformity  SKIN: Warm and dry NEUROLOGIC:  Alert and oriented x 3 PSYCHIATRIC:  Normal affect   ASSESSMENT:    No diagnosis found. PLAN:    In  order of problems listed above:  ***      {Are you ordering a CV Procedure (e.g. stress test, cath, DCCV, TEE, etc)?   Press F2        :[id number]}    Medication Adjustments/Labs and Tests Ordered: Current medicines are reviewed at length with the patient today.  Concerns regarding medicines are outlined above.  No orders of the defined types were placed in this encounter.  No orders of the defined types were placed in this encounter.   There are no Patient Instructions on file for this visit.   Ramond Dial, [place]  05/20/2023 9:56 AM    [facility]

## 2023-05-20 NOTE — Patient Instructions (Signed)
Medication Instructions:  No Changes *If you need a refill on your cardiac medications before your next appointment, please call your pharmacy*   Lab Work: No Labs If you have labs (blood work) drawn today and your tests are completely normal, you will receive your results only by: MyChart Message (if you have MyChart) OR A paper copy in the mail If you have any lab test that is abnormal or we need to change your treatment, we will call you to review the results.   Testing/Procedures: No Testing   Follow-Up: At [facility], you and your health needs are our priority.  As part of our continuing mission to provide you with exceptional heart care, we have created designated Provider Care Teams.  These Care Teams include your primary Cardiologist (physician) and Advanced Practice Providers (APPs -  Physician Assistants and Nurse Practitioners) who all work together to provide you with the care you need, when you need it.  We recommend signing up for the patient portal called "MyChart".  Sign up information is provided on this After Visit Summary.  MyChart is used to connect with patients for Virtual Visits (Telemedicine).  Patients are able to view lab/test results, encounter notes, upcoming appointments, etc.  Non-urgent messages can be sent to your provider as well.   To learn more about what you can do with MyChart, go to [email or web address].    Your next appointment:   Keep Scheduled Appointment  Provider:   Rollene Rotunda, MD

## 2023-05-21 ENCOUNTER — Telehealth: Payer: Self-pay

## 2023-05-21 NOTE — Telephone Encounter (Signed)
Physician orders (order number: [id number])- signed and faxed back to [facility] at [phone]. Form sent for scanning.

## 2023-05-21 NOTE — Telephone Encounter (Signed)
Physician orders (order number: [id number])- signed and faxed back to [facility] at [phone]. Form sent for scanning.

## 2023-05-22 ENCOUNTER — Encounter: Payer: Self-pay | Admitting: Physician Assistant

## 2023-05-22 NOTE — Progress Notes (Signed)
Cardiology Office Note:    Date:  05/22/2023   ID:  Ethan Mcneil, DOB [birth year], MRN [id number]  PCP:  Wanda Plump, MD   [facility] Providers Cardiologist:  Rollene Rotunda, MD     Referring MD: Wanda Plump, MD   Chief Complaint  Patient presents with   Follow-up    Seen for Dr. Antoine Poche    History of Present Illness:    Ethan Mcneil is a 89 y.o. 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  Last cardiac catheterization in 2009 revealed patent grafts.   Patient was seen previously in the emergency room for shortness of breath in December 2022.  BNP at the time was 1900.  Creatinine 2.5.  He received 40 mg IV Lasix which made him feel better.  He was seen by Dr. Anne Fu on 12/28/2021, he was felt to be massively volume overloaded at the time.  Patient was directly admitted to the hospital.  Echocardiogram obtained on 12/29/2021 showed EF 55%, mild LVH, grade 2 DD, RVSP 44.2 mmHg, mild to moderate MR, moderate to severe TR. he was admitted for acute on chronic heart failure and community-acquired pneumonia in April 2023.  Metoprolol was decreased due to slow heart rate.  He was discharged home on torsemide.    He was seen by Dr. Antoine Poche in June 2023, he was found to be volume overloaded, he was encouraged to take additional dose of torsemide for 3 days before going back to the previous dose.  He went to [facility] ED in March 2024 with chest pain and progressive dyspnea and orthopnea.  He was found to be volume overloaded requiring BiPAP therapy.  He was subsequently transferred to [facility] and treated for acute hypoxic respiratory failure and acute on chronic diastolic heart failure.  BNP was elevated at 2231.  Serial troponin was 163-->906.  Echocardiogram obtained on 03/17/2023 demonstrated EF 35%.  With IV diuresis, creatinine trended up to 3.87 before trending back down to 3.22 on discharge.   Chest pain and elevated troponin was managed medically given advanced stage IV CKD and a high risk for contrast nephropathy.  He had anemia with baseline hemoglobin 9--10 during hospitalization.  Hospital course complicated by UTI treated with ceftriaxone and discharged on renally dosed cefdinir for 6 days.  Since the last visit, patient returned to the hospital on 04/12/2023, BNP was greater than 30,000.  Chest x-ray showed bilateral pleural effusion.  He underwent aggressive IV diuresis.  Echocardiogram showed EF has reduced further down to 25%, moderate MR, moderate TR, moderately reduced RVEF.  Due to intermittent chest pain, patient underwent cardiac catheterization on 04/16/2023 and had successful PCI of vein graft on 5/2.  He was discharged on 60 mg daily of torsemide.  His creatinine on discharge was 3.27.   He was seen by Dr. Jacquelyne Balint of [facility] cardiology service on 05/16/2023 at which time he was doing well.  His 60 mg daily of torsemide was reportedly reduced down to 40 mg daily at that time.  He has not seen any significant weight gain.  Since stent placement, he has not had any major chest discomfort.  He has no lower extremity edema, orthopnea or PND.  Overall, he is doing well from the cardiac perspective.  Last blood work obtained in May showed stable renal function at 3.2.  I recommended continue on the current therapy and follow-up  with Dr. Antoine Poche as previously scheduled in August.    Past Medical History:  Diagnosis Date   Anemia    Arthritis    CAD (coronary artery disease)    CABG 1997; grafts patent @ cath [facility] 11/09   DM2 (diabetes mellitus, type 2) (HCC)    Gout    after tick bite   History of kidney stones    HLD (hyperlipidemia)    HTN (hypertension)    essential nos   Hypothyroid    Pancreatic lesion    Postsurgical aortocoronary bypass status    PVD (peripheral vascular disease) (HCC)    S/P herniorrhaphy    Tick fever 2012   [facility] [place]    Unspecified hyperplasia of prostate without urinary obstruction and other lower urinary tract symptoms (LUTS)    Urolithiasis 2015   hx of   Wears dentures    Wears glasses     Past Surgical History:  Procedure Laterality Date   BIOPSY  08/07/2020   Procedure: BIOPSY;  Surgeon: Lemar Lofty., MD;  Location: [facility] ENDOSCOPY;  Service: Gastroenterology;;   CATARACT EXTRACTION Bilateral    CORONARY ARTERY BYPASS GRAFT     x7 vessels 1997; cath 2002 and 2009; grafts patent    CYSTOSCOPY WITH RETROGRADE PYELOGRAM, URETEROSCOPY AND STENT PLACEMENT Right 02/21/2014   Procedure: CYSTOSCOPY WITH RIGHT  RETROGRADE PYELOGRAM,RIGHT  URETEROSCOPY WITH STONE BASKETING EXTRACTION;  Surgeon: Bjorn Pippin, MD;  Location: [facility] ORS;  Service: Urology;  Laterality: Right;   ESOPHAGOGASTRODUODENOSCOPY (EGD) WITH PROPOFOL N/A 08/07/2020   Procedure: ESOPHAGOGASTRODUODENOSCOPY (EGD) WITH PROPOFOL;  Surgeon: Meridee Score Netty Starring., MD;  Location: [facility] ENDOSCOPY;  Service: Gastroenterology;  Laterality: N/A;   EUS N/A 08/07/2020   Procedure: UPPER ENDOSCOPIC ULTRASOUND (EUS) LINEAR;  Surgeon: Lemar Lofty., MD;  Location: [facility] ENDOSCOPY;  Service: Gastroenterology;  Laterality: N/A;   FINE NEEDLE ASPIRATION N/A 08/07/2020   Procedure: FINE NEEDLE ASPIRATION (FNA) LINEAR;  Surgeon: Lemar Lofty., MD;  Location: [facility] ENDOSCOPY;  Service: Gastroenterology;  Laterality: N/A;   HERNIA REPAIR     INGUINAL HERNIA REPAIR     LEFT HEART CATH  04/16/2023   LEFT HEART CATH  04/17/2023   MULTIPLE TOOTH EXTRACTIONS     SHOULDER SURGERY     SPLENECTOMY, TOTAL N/A 10/26/2020   Procedure: SPLENECTOMY;  Surgeon: Almond Lint, MD;  Location: [facility] OR;  Service: General;  Laterality: N/A;    Current Medications: Current Meds  Medication Sig   acetaminophen (TYLENOL) 500 MG tablet Take 500 mg by mouth every 4 (four) hours as needed for moderate pain.   amLODipine (NORVASC) 10 MG tablet Take 1 tablet (10 mg  total) by mouth daily.   aspirin EC 81 MG tablet Take 81 mg by mouth at bedtime.   atorvastatin (LIPITOR) 80 MG tablet Take 1 tablet (80 mg total) by mouth daily.   B-D UF III MINI PEN NEEDLES 31G X 5 MM MISC USE 3 TIMES A DAY AS DIRECTED   Cholecalciferol (VITAMIN D3) 50 MCG (2000 UT) capsule Take 2,000 Units by mouth daily.   clopidogrel (PLAVIX) 75 MG tablet Take 1 tablet (75 mg total) by mouth daily.   Continuous Blood Gluc Receiver (FREESTYLE LIBRE 2 READER) DEVI Use as instructed to check blood sugar   Continuous Blood Gluc Sensor (FREESTYLE LIBRE 2 SENSOR) MISC 1 each by Does not apply route every 14 (fourteen) days.   Cyanocobalamin (B-12) 1000 MCG TABS Take 1,000 mcg by mouth daily.   FREESTYLE  LITE test strip CHECK BLOOD SUGAR NO MORE THAN TWICE DAILY.   hydrALAZINE (APRESOLINE) 100 MG tablet Take 1 tablet (100 mg total) by mouth 3 (three) times daily.   insulin aspart (FIASP) 100 UNIT/ML FlexTouch Pen Inject 6-8 Units into the skin with breakfast, with lunch, and with evening meal.   Insulin Glargine (BASAGLAR KWIKPEN) 100 UNIT/ML Inject 10-12 Units into the skin daily. (Patient taking differently: Inject 6 Units into the skin daily.)   isosorbide mononitrate (IMDUR) 120 MG 24 hr tablet Take 1 tablet (120 mg total) by mouth daily.   levothyroxine (SYNTHROID) 150 MCG tablet Take 1 tablet (150 mcg total) by mouth daily before breakfast.   metoprolol succinate (TOPROL-XL) 25 MG 24 hr tablet Take 1 tablet (25 mg total) by mouth daily.   nitroGLYCERIN (NITROSTAT) 0.4 MG SL tablet Place 1 tablet (0.4 mg total) under the tongue every 5 (five) minutes x 3 doses as needed for chest pain.   Pancrelipase, Lip-Prot-Amyl, (ZENPEP) 25000-79000 units CPEP Take 2 capsules by mouth with every meal and snacks (Patient taking differently: Take 1 capsule by mouth daily at 12 noon.)   pantoprazole (PROTONIX) 40 MG tablet Take 1 tablet (40 mg total) by mouth daily.   sodium bicarbonate 650 MG tablet Take 650  mg by mouth 2 (two) times daily.   torsemide (DEMADEX) 20 MG tablet Take 40 mg by mouth daily.     Allergies:   Ciprofloxacin and Codeine   Social History   Socioeconomic History   Marital status: Married    Spouse name: Pat   Number of children: 4   Years of education: Not on file   Highest education level: Not on file  Occupational History   Occupation: retired, works few hours   Tobacco Use   Smoking status: Never   Smokeless tobacco: Never  Vaping Use   Vaping Use: Never used  Substance and Sexual Activity   Alcohol use: No   Drug use: No   Sexual activity: Not Currently  Other Topics Concern   Not on file  Social History Narrative   Lives alone   Drives      Patient Curator; aviator and former race car driver   4 children (boys)      Social Determinants of Health   Financial Resource Strain: Low Risk  (07/16/2022)   Overall Financial Resource Strain (CARDIA)    Difficulty of Paying Living Expenses: Not hard at all  Food Insecurity: No Food Insecurity (04/22/2023)   Hunger Vital Sign    Worried About Running Out of Food in the Last Year: Never true    Ran Out of Food in the Last Year: Never true  Transportation Needs: No Transportation Needs (04/22/2023)   PRAPARE - Administrator, Civil Service (Medical): No    Lack of Transportation (Non-Medical): No  Physical Activity: Insufficiently Active (07/16/2022)   Exercise Vital Sign    Days of Exercise per Week: 5 days    Minutes of Exercise per Session: 20 min  Stress: No Stress Concern Present (07/16/2022)   [employer] of Occupational Health - Occupational Stress Questionnaire    Feeling of Stress : Not at all  Social Connections: Moderately Isolated (07/16/2022)   Social Connection and Isolation Panel [NHANES]    Frequency of Communication with Friends and Family: More than three times a week    Frequency of Social Gatherings with Friends and Family: More than three times a week    Attends Religious  Services:  More than 4 times per year    Active Member of Clubs or Organizations: No    Attends Banker Meetings: Never    Marital Status: Widowed     Family History: The patient's family history includes Coronary artery disease in his mother; Diabetes in his brother; Lung cancer in his father; Prostate cancer in his maternal uncle; Stroke in his son; Throat cancer in his son. There is no history of Colon cancer, Rectal cancer, Stomach cancer, Esophageal cancer, or Pancreatic cancer.  ROS:   Please see the history of present illness.     All other systems reviewed and are negative.  EKGs/Labs/Other Studies Reviewed:    The following studies were reviewed today:  Echo 04/13/2023 Summary   1. The left ventricle is moderately dilated in size with normal wall  thickness.   2. The left ventricular systolic function is severely decreased, LVEF is  visually estimated at 25%.    3. There is thinning and akinesis of the inferior/posterior wall.    4. Mitral annular calcification is present (moderate).    5. The mitral valve leaflets are mildly thickened with normal leaflet  mobility.   6. There is moderate mitral valve regurgitation.    7. The aortic valve is probably trileaflet with moderately thickened  leaflets with moderately reduced excursion.    8. The left atrium is moderately dilated in size.    9. There is moderate pulmonary hypertension.    10. The right ventricle is mildly dilated in size, with moderately reduced  systolic function.    11. There is moderate tricuspid regurgitation.    12. The right atrium is mildly dilated in size.    Cath 04/16/2023 Findings:  Severe native coronary artery disease.  Patent LIMA-LAD graft, patent SVG-RCA, and patent SVG-D2  Patent SVG jump graft (D1-OM1-OM2-distal Lcx) with 95-99% stenosis just  distal to OM2  anastamosis   Normal/high-normal left ventricular filling pressures (LVEDP = 14 mm Hg).   Recommendations:  Will  attempt PCI at SVG jump graft on 5/2   Cath 04/17/2023 CONCLUSIONS:  - S/p ultrasound guided right femoral artery access  - Successful PCI/DES to the SVG to the OM body with placement of three 3  mm x 18 mm Xience drug eluting stents with excellent angiographic result  and TIMI 3 flow    RECOMMENDATIONS:  - Femoral access protocol  - Optimize medical Rx  - Risk factor modification  - Dual antiplatelet therapy with aspirin and clopidogrel for at least 12  months, ideally longer  - Ongoing management of ACS by inpatient service    EKG:  EKG is not ordered today.   Recent Labs: 11/26/2022: TSH 3.22 04/25/2023: ALT 6 04/29/2023: BUN 45; Creatinine 3.2; Hemoglobin 9.2; Platelets 313; Potassium 4.5; Sodium 134  Recent Lipid Panel    Component Value Date/Time   CHOL 116 04/22/2022 0907   TRIG 69 04/22/2022 0907   HDL 57 04/22/2022 0907   CHOLHDL 2.0 04/22/2022 0907   CHOLHDL 2 09/25/2021 0901   VLDL 17.4 09/25/2021 0901   LDLCALC 45 04/22/2022 0907     Risk Assessment/Calculations:           Physical Exam:    VS:  BP (!) 126/54   Pulse 66   Ht 5\' 10"  (1.778 m)   Wt 136 lb 6.4 oz (61.9 kg)   SpO2 97%   BMI 19.57 kg/m        Wt Readings from Last 3 Encounters:  05/20/23  136 lb 6.4 oz (61.9 kg)  04/25/23 133 lb 8 oz (60.6 kg)  04/09/23 147 lb 6.4 oz (66.9 kg)     GEN:  Well nourished, well developed in no acute distress HEENT: Normal NECK: No JVD; No carotid bruits LYMPHATICS: No lymphadenopathy CARDIAC: RRR, no murmurs, rubs, gallops RESPIRATORY:  Clear to auscultation without rales, wheezing or rhonchi  ABDOMEN: Soft, non-tender, non-distended MUSCULOSKELETAL:  No edema; No deformity  SKIN: Warm and dry NEUROLOGIC:  Alert and oriented x 3 PSYCHIATRIC:  Normal affect   ASSESSMENT:    1. Coronary artery disease involving coronary bypass graft of native heart without angina pectoris   2. Chronic kidney disease (CKD), stage IV (severe) (HCC)   3. Essential  hypertension   4. Hyperlipidemia LDL goal <70   5. Chronic systolic heart failure (HCC)    PLAN:    In order of problems listed above:  CAD s/p CABG: He recently was admitted at [facility] after presenting with chest pain.  We were hesitant to proceed with cardiac catheterization in the past, however he was able to undergo staged PCI during the recent hospitalization.  His chest pain has resolved.  Continue aspirin and Plavix  CKD stage IV: Baseline creatinine greater than 3.  Followed by nephrology service  Hypertension: Blood pressure stable on current therapy  Hyperlipidemia: On Lipitor  Chronic systolic heart failure: Recent echocardiogram showed EF further decreased down to 25%.  He subsequently underwent PCI.  Continue metoprolol succinate.  Consider repeat echocardiogram in 3 months to see if EF improved after recent PCI.           Medication Adjustments/Labs and Tests Ordered: Current medicines are reviewed at length with the patient today.  Concerns regarding medicines are outlined above.  No orders of the defined types were placed in this encounter.  No orders of the defined types were placed in this encounter.   Patient Instructions  Medication Instructions:  No Changes *If you need a refill on your cardiac medications before your next appointment, please call your pharmacy*   Lab Work: No Labs If you have labs (blood work) drawn today and your tests are completely normal, you will receive your results only by: MyChart Message (if you have MyChart) OR A paper copy in the mail If you have any lab test that is abnormal or we need to change your treatment, we will call you to review the results.   Testing/Procedures: No Testing   Follow-Up: At [facility], you and your health needs are our priority.  As part of our continuing mission to provide you with exceptional heart care, we have created designated Provider Care Teams.  These Care Teams include your  primary Cardiologist (physician) and Advanced Practice Providers (APPs -  Physician Assistants and Nurse Practitioners) who all work together to provide you with the care you need, when you need it.  We recommend signing up for the patient portal called "MyChart".  Sign up information is provided on this After Visit Summary.  MyChart is used to connect with patients for Virtual Visits (Telemedicine).  Patients are able to view lab/test results, encounter notes, upcoming appointments, etc.  Non-urgent messages can be sent to your provider as well.   To learn more about what you can do with MyChart, go to [email or web address].    Your next appointment:   Keep Scheduled Appointment  Provider:   Rollene Rotunda, MD     Signed, Azalee Course, [place]  05/22/2023 2:50 PM  [facility]

## 2023-05-22 NOTE — Progress Notes (Deleted)
Cardiology Office Note:    Date:  05/22/2023   ID:  Ethan Mcneil, DOB April 14, 1934, MRN [id number]  PCP:  Wanda Plump, MD   [facility] Providers Cardiologist:  Rollene Rotunda, MD     Referring MD: Wanda Plump, MD   Chief Complaint  Patient presents with   Follow-up    Seen for Dr. Antoine Poche    History of Present Illness:    Ethan Mcneil is a 89 y.o. 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  Last cardiac catheterization in 2009 revealed patent grafts.   Patient was seen previously in the emergency room for shortness of breath in December 2022.  BNP at the time was 1900.  Creatinine 2.5.  He received 40 mg IV Lasix which made him feel better.  He was seen by Dr. Anne Fu on 12/28/2021, he was felt to be massively volume overloaded at the time.  Patient was directly admitted to the hospital.  Echocardiogram obtained on 12/29/2021 showed EF 55%, mild LVH, grade 2 DD, RVSP 44.2 mmHg, mild to moderate MR, moderate to severe TR. he was admitted for acute on chronic heart failure and community-acquired pneumonia in April 2023.  Metoprolol was decreased due to slow heart rate.  He was discharged home on torsemide.    He was seen by Dr. Antoine Poche in June 2023, he was found to be volume overloaded, he was encouraged to take additional dose of torsemide for 3 days before going back to the previous dose.  He went to [facility] ED in March 2024 with chest pain and progressive dyspnea and orthopnea.  He was found to be volume overloaded requiring BiPAP therapy.  He was subsequently transferred to [facility] and treated for acute hypoxic respiratory failure and acute on chronic diastolic heart failure.  BNP was elevated at 2231.  Serial troponin was 163-->906.  Echocardiogram obtained on 03/17/2023 demonstrated EF 35%.  With IV diuresis, creatinine trended up to 3.87 before trending back down to 3.22 on discharge.   Chest pain and elevated troponin was managed medically given advanced stage IV CKD and a high risk for contrast nephropathy.  He had anemia with baseline hemoglobin 9--10 during hospitalization.  Hospital course complicated by UTI treated with ceftriaxone and discharged on renally dosed cefdinir for 6 days.  Since the last visit, patient returned to the hospital on 04/12/2023, BNP was greater than 30,000.  Chest x-ray showed bilateral pleural effusion.  He underwent aggressive IV diuresis.  Echocardiogram showed EF has reduced further down to 25%, moderate MR, moderate TR, moderately reduced RVEF.  Due to intermittent chest pain, patient underwent cardiac catheterization on 04/16/2023 and had successful PCI of vein graft on 5/2.  He was discharged on 60 mg daily of torsemide.  His creatinine on discharge was 3.27.   He was seen by Dr. Jacquelyne Balint of [facility] cardiology service on 05/16/2023 at which time he was doing well.  His 60 mg daily of torsemide was reportedly reduced down to 40 mg daily at that time.  He has not seen any significant weight gain.  Since stent placement, he has not had any major chest discomfort.  He has no lower extremity edema, orthopnea or PND.  Overall, he is doing well from the cardiac perspective.  Last blood work obtained in May showed stable renal function at 3.2.  I recommended continue on the current therapy and follow-up  with Dr. Antoine Poche as previously scheduled in August.    Past Medical History:  Diagnosis Date   Anemia    Arthritis    CAD (coronary artery disease)    CABG 1997; grafts patent @ cath [facility] 11/09   DM2 (diabetes mellitus, type 2) (HCC)    Gout    after tick bite   History of kidney stones    HLD (hyperlipidemia)    HTN (hypertension)    essential nos   Hypothyroid    Pancreatic lesion    Postsurgical aortocoronary bypass status    PVD (peripheral vascular disease) (HCC)    S/P herniorrhaphy    Tick fever 2012   [facility] [place]    Unspecified hyperplasia of prostate without urinary obstruction and other lower urinary tract symptoms (LUTS)    Urolithiasis 2015   hx of   Wears dentures    Wears glasses     Past Surgical History:  Procedure Laterality Date   BIOPSY  08/07/2020   Procedure: BIOPSY;  Surgeon: Lemar Lofty., MD;  Location: [facility] ENDOSCOPY;  Service: Gastroenterology;;   CATARACT EXTRACTION Bilateral    CORONARY ARTERY BYPASS GRAFT     x7 vessels 1997; cath 2002 and 2009; grafts patent    CYSTOSCOPY WITH RETROGRADE PYELOGRAM, URETEROSCOPY AND STENT PLACEMENT Right 02/21/2014   Procedure: CYSTOSCOPY WITH RIGHT  RETROGRADE PYELOGRAM,RIGHT  URETEROSCOPY WITH STONE BASKETING EXTRACTION;  Surgeon: Bjorn Pippin, MD;  Location: [facility] ORS;  Service: Urology;  Laterality: Right;   ESOPHAGOGASTRODUODENOSCOPY (EGD) WITH PROPOFOL N/A 08/07/2020   Procedure: ESOPHAGOGASTRODUODENOSCOPY (EGD) WITH PROPOFOL;  Surgeon: Meridee Score Netty Starring., MD;  Location: [facility] ENDOSCOPY;  Service: Gastroenterology;  Laterality: N/A;   EUS N/A 08/07/2020   Procedure: UPPER ENDOSCOPIC ULTRASOUND (EUS) LINEAR;  Surgeon: Lemar Lofty., MD;  Location: [facility] ENDOSCOPY;  Service: Gastroenterology;  Laterality: N/A;   FINE NEEDLE ASPIRATION N/A 08/07/2020   Procedure: FINE NEEDLE ASPIRATION (FNA) LINEAR;  Surgeon: Lemar Lofty., MD;  Location: [facility] ENDOSCOPY;  Service: Gastroenterology;  Laterality: N/A;   HERNIA REPAIR     INGUINAL HERNIA REPAIR     LEFT HEART CATH  04/16/2023   LEFT HEART CATH  04/17/2023   MULTIPLE TOOTH EXTRACTIONS     SHOULDER SURGERY     SPLENECTOMY, TOTAL N/A 10/26/2020   Procedure: SPLENECTOMY;  Surgeon: Almond Lint, MD;  Location: [facility] OR;  Service: General;  Laterality: N/A;    Current Medications: Current Meds  Medication Sig   acetaminophen (TYLENOL) 500 MG tablet Take 500 mg by mouth every 4 (four) hours as needed for moderate pain.   amLODipine (NORVASC) 10 MG tablet Take 1 tablet (10 mg  total) by mouth daily.   aspirin EC 81 MG tablet Take 81 mg by mouth at bedtime.   atorvastatin (LIPITOR) 80 MG tablet Take 1 tablet (80 mg total) by mouth daily.   B-D UF III MINI PEN NEEDLES 31G X 5 MM MISC USE 3 TIMES A DAY AS DIRECTED   Cholecalciferol (VITAMIN D3) 50 MCG (2000 UT) capsule Take 2,000 Units by mouth daily.   clopidogrel (PLAVIX) 75 MG tablet Take 1 tablet (75 mg total) by mouth daily.   Continuous Blood Gluc Receiver (FREESTYLE LIBRE 2 READER) DEVI Use as instructed to check blood sugar   Continuous Blood Gluc Sensor (FREESTYLE LIBRE 2 SENSOR) MISC 1 each by Does not apply route every 14 (fourteen) days.   Cyanocobalamin (B-12) 1000 MCG TABS Take 1,000 mcg by mouth daily.   FREESTYLE  LITE test strip CHECK BLOOD SUGAR NO MORE THAN TWICE DAILY.   hydrALAZINE (APRESOLINE) 100 MG tablet Take 1 tablet (100 mg total) by mouth 3 (three) times daily.   insulin aspart (FIASP) 100 UNIT/ML FlexTouch Pen Inject 6-8 Units into the skin with breakfast, with lunch, and with evening meal.   Insulin Glargine (BASAGLAR KWIKPEN) 100 UNIT/ML Inject 10-12 Units into the skin daily. (Patient taking differently: Inject 6 Units into the skin daily.)   isosorbide mononitrate (IMDUR) 120 MG 24 hr tablet Take 1 tablet (120 mg total) by mouth daily.   levothyroxine (SYNTHROID) 150 MCG tablet Take 1 tablet (150 mcg total) by mouth daily before breakfast.   metoprolol succinate (TOPROL-XL) 25 MG 24 hr tablet Take 1 tablet (25 mg total) by mouth daily.   nitroGLYCERIN (NITROSTAT) 0.4 MG SL tablet Place 1 tablet (0.4 mg total) under the tongue every 5 (five) minutes x 3 doses as needed for chest pain.   Pancrelipase, Lip-Prot-Amyl, (ZENPEP) 25000-79000 units CPEP Take 2 capsules by mouth with every meal and snacks (Patient taking differently: Take 1 capsule by mouth daily at 12 noon.)   pantoprazole (PROTONIX) 40 MG tablet Take 1 tablet (40 mg total) by mouth daily.   sodium bicarbonate 650 MG tablet Take 650  mg by mouth 2 (two) times daily.   torsemide (DEMADEX) 20 MG tablet Take 40 mg by mouth daily.     Allergies:   Ciprofloxacin and Codeine   Social History   Socioeconomic History   Marital status: Married    Spouse name: Pat   Number of children: 4   Years of education: Not on file   Highest education level: Not on file  Occupational History   Occupation: retired, works few hours   Tobacco Use   Smoking status: Never   Smokeless tobacco: Never  Vaping Use   Vaping Use: Never used  Substance and Sexual Activity   Alcohol use: No   Drug use: No   Sexual activity: Not Currently  Other Topics Concern   Not on file  Social History Narrative   Lives alone   Drives      Patient Curator; aviator and former race car driver   4 children (boys)      Social Determinants of Health   Financial Resource Strain: Low Risk  (07/16/2022)   Overall Financial Resource Strain (CARDIA)    Difficulty of Paying Living Expenses: Not hard at all  Food Insecurity: No Food Insecurity (04/22/2023)   Hunger Vital Sign    Worried About Running Out of Food in the Last Year: Never true    Ran Out of Food in the Last Year: Never true  Transportation Needs: No Transportation Needs (04/22/2023)   PRAPARE - Administrator, Civil Service (Medical): No    Lack of Transportation (Non-Medical): No  Physical Activity: Insufficiently Active (07/16/2022)   Exercise Vital Sign    Days of Exercise per Week: 5 days    Minutes of Exercise per Session: 20 min  Stress: No Stress Concern Present (07/16/2022)   [employer] of Occupational Health - Occupational Stress Questionnaire    Feeling of Stress : Not at all  Social Connections: Moderately Isolated (07/16/2022)   Social Connection and Isolation Panel [NHANES]    Frequency of Communication with Friends and Family: More than three times a week    Frequency of Social Gatherings with Friends and Family: More than three times a week    Attends Religious  Services:  More than 4 times per year    Active Member of Clubs or Organizations: No    Attends Banker Meetings: Never    Marital Status: Widowed     Family History: The patient's family history includes Coronary artery disease in his mother; Diabetes in his brother; Lung cancer in his father; Prostate cancer in his maternal uncle; Stroke in his son; Throat cancer in his son. There is no history of Colon cancer, Rectal cancer, Stomach cancer, Esophageal cancer, or Pancreatic cancer.  ROS:   Please see the history of present illness.     All other systems reviewed and are negative.  EKGs/Labs/Other Studies Reviewed:    The following studies were reviewed today:  Echo 04/13/2023 Summary   1. The left ventricle is moderately dilated in size with normal wall  thickness.   2. The left ventricular systolic function is severely decreased, LVEF is  visually estimated at 25%.    3. There is thinning and akinesis of the inferior/posterior wall.    4. Mitral annular calcification is present (moderate).    5. The mitral valve leaflets are mildly thickened with normal leaflet  mobility.   6. There is moderate mitral valve regurgitation.    7. The aortic valve is probably trileaflet with moderately thickened  leaflets with moderately reduced excursion.    8. The left atrium is moderately dilated in size.    9. There is moderate pulmonary hypertension.    10. The right ventricle is mildly dilated in size, with moderately reduced  systolic function.    11. There is moderate tricuspid regurgitation.    12. The right atrium is mildly dilated in size.    Cath 04/16/2023 Findings:  Severe native coronary artery disease.  Patent LIMA-LAD graft, patent SVG-RCA, and patent SVG-D2  Patent SVG jump graft (D1-OM1-OM2-distal Lcx) with 95-99% stenosis just  distal to OM2  anastamosis   Normal/high-normal left ventricular filling pressures (LVEDP = 14 mm Hg).   Recommendations:  Will  attempt PCI at SVG jump graft on 5/2   Cath 04/17/2023 CONCLUSIONS:  - S/p ultrasound guided right femoral artery access  - Successful PCI/DES to the SVG to the OM body with placement of three 3  mm x 18 mm Xience drug eluting stents with excellent angiographic result  and TIMI 3 flow    RECOMMENDATIONS:  - Femoral access protocol  - Optimize medical Rx  - Risk factor modification  - Dual antiplatelet therapy with aspirin and clopidogrel for at least 12  months, ideally longer  - Ongoing management of ACS by inpatient service    EKG:  EKG is not ordered today.   Recent Labs: 11/26/2022: TSH 3.22 04/25/2023: ALT 6 04/29/2023: BUN 45; Creatinine 3.2; Hemoglobin 9.2; Platelets 313; Potassium 4.5; Sodium 134  Recent Lipid Panel    Component Value Date/Time   CHOL 116 04/22/2022 0907   TRIG 69 04/22/2022 0907   HDL 57 04/22/2022 0907   CHOLHDL 2.0 04/22/2022 0907   CHOLHDL 2 09/25/2021 0901   VLDL 17.4 09/25/2021 0901   LDLCALC 45 04/22/2022 0907     Risk Assessment/Calculations:           Physical Exam:    VS:  BP (!) 126/54   Pulse 66   Ht 5\' 10"  (1.778 m)   Wt 136 lb 6.4 oz (61.9 kg)   SpO2 97%   BMI 19.57 kg/m        Wt Readings from Last 3 Encounters:  05/20/23  136 lb 6.4 oz (61.9 kg)  04/25/23 133 lb 8 oz (60.6 kg)  04/09/23 147 lb 6.4 oz (66.9 kg)     GEN:  Well nourished, well developed in no acute distress HEENT: Normal NECK: No JVD; No carotid bruits LYMPHATICS: No lymphadenopathy CARDIAC: RRR, no murmurs, rubs, gallops RESPIRATORY:  Clear to auscultation without rales, wheezing or rhonchi  ABDOMEN: Soft, non-tender, non-distended MUSCULOSKELETAL:  No edema; No deformity  SKIN: Warm and dry NEUROLOGIC:  Alert and oriented x 3 PSYCHIATRIC:  Normal affect   ASSESSMENT:    1. Coronary artery disease involving coronary bypass graft of native heart without angina pectoris   2. Chronic kidney disease (CKD), stage IV (severe) (HCC)   3. Essential  hypertension   4. Hyperlipidemia LDL goal <70   5. Chronic systolic heart failure (HCC)    PLAN:    In order of problems listed above:  CAD s/p CABG: He recently was admitted at [facility] after presenting with chest pain.  We were hesitant to proceed with cardiac catheterization in the past, however he was able to undergo staged PCI during the recent hospitalization.  His chest pain has resolved.  Continue aspirin and Plavix  CKD stage IV: Baseline creatinine greater than 3.  Followed by nephrology service  Hypertension: Blood pressure stable on current therapy  Hyperlipidemia: On Lipitor  Chronic systolic heart failure: Recent echocardiogram showed EF further decreased down to 25%.  He subsequently underwent PCI.  Continue metoprolol succinate.  Consider repeat echocardiogram in 3 months to see if EF improved after recent PCI.           Medication Adjustments/Labs and Tests Ordered: Current medicines are reviewed at length with the patient today.  Concerns regarding medicines are outlined above.  No orders of the defined types were placed in this encounter.  No orders of the defined types were placed in this encounter.   Patient Instructions  Medication Instructions:  No Changes *If you need a refill on your cardiac medications before your next appointment, please call your pharmacy*   Lab Work: No Labs If you have labs (blood work) drawn today and your tests are completely normal, you will receive your results only by: MyChart Message (if you have MyChart) OR A paper copy in the mail If you have any lab test that is abnormal or we need to change your treatment, we will call you to review the results.   Testing/Procedures: No Testing   Follow-Up: At [facility], you and your health needs are our priority.  As part of our continuing mission to provide you with exceptional heart care, we have created designated Provider Care Teams.  These Care Teams include your  primary Cardiologist (physician) and Advanced Practice Providers (APPs -  Physician Assistants and Nurse Practitioners) who all work together to provide you with the care you need, when you need it.  We recommend signing up for the patient portal called "MyChart".  Sign up information is provided on this After Visit Summary.  MyChart is used to connect with patients for Virtual Visits (Telemedicine).  Patients are able to view lab/test results, encounter notes, upcoming appointments, etc.  Non-urgent messages can be sent to your provider as well.   To learn more about what you can do with MyChart, go to [email or web address].    Your next appointment:   Keep Scheduled Appointment  Provider:   Rollene Rotunda, MD     Signed, Azalee Course, [place]  05/22/2023 2:46 PM  [facility]

## 2023-05-24 ENCOUNTER — Other Ambulatory Visit: Payer: Self-pay | Admitting: Cardiology

## 2023-05-27 NOTE — Telephone Encounter (Signed)
Call to patient. No answer. LVM to return call.

## 2023-05-27 NOTE — Telephone Encounter (Signed)
Pt c/o BP issue: STAT if pt c/o blurred vision, one-sided weakness or slurred speech  1. What are your last 5 BP readings? 90/50  2. Are you having any other symptoms (ex. Dizziness, headache, blurred vision, passed out)? Weak, no energy  3. What is your BP issue? Home health nurse states that pt bp has been running low for the past several weeks. Nurse would like to know if provider wants parameters for pt BP. She would like a callback regarding this matter. Please Advise

## 2023-05-28 ENCOUNTER — Telehealth: Payer: Self-pay | Admitting: Cardiology

## 2023-05-28 NOTE — Telephone Encounter (Signed)
Call to Home number, LVM to call office. Call to Cell number Sounds as if someone picks up but no answer. Call to daughter, LVM to call office

## 2023-05-28 NOTE — Telephone Encounter (Signed)
Pt returned callFrances Furbish nurse called yesterday about low BP's.  BP was 90/50 in the afternoon. Pt reports that yesterday he felt a little dizzy and off balance. BP was low  Today BP 130/74, but pt has not taken the hydralazine yet this morning and is feeling fine- no s/s. Hydralazine is prescribed for 3 times a day.   The pt plans to take his BP "before" his scheduled Medication times and 1-2 hrs after he takes his medication   They would like parameters of BP- what is too low= would not take medicine; but if it is a certain reading or over= would be ok to take the medication  And/Or would you like to go ahead and decrease the dosage of Hydralazine and/or amount of times he take it?  Informed pt that I will send this info to his provider and we will call him back with recommendations. Given ER precautions and told to get up slowly- from lying, sitting, standing positions. He verbalized understanding.   (Please call pt's cell phone- it is preferred number).

## 2023-05-28 NOTE — Telephone Encounter (Signed)
Patient states he is returning call and is requesting return call. Believes it was in regards to his medication.

## 2023-05-28 NOTE — Telephone Encounter (Signed)
Duplicate phone note, see the previous one- continuation

## 2023-05-29 DIAGNOSIS — D631 Anemia in chronic kidney disease: Secondary | ICD-10-CM | POA: Diagnosis not present

## 2023-05-29 DIAGNOSIS — N184 Chronic kidney disease, stage 4 (severe): Secondary | ICD-10-CM | POA: Diagnosis not present

## 2023-05-30 NOTE — Telephone Encounter (Signed)
Spoke with pt, aware of dr hochrein' recommendations. He will call to get a sooner appointment if he has problems.

## 2023-06-02 ENCOUNTER — Ambulatory Visit: Payer: Self-pay

## 2023-06-02 NOTE — Patient Outreach (Signed)
  Care Coordination   Follow Up Visit Note   06/02/2023 Name: Ethan Mcneil MRN: [id number] DOB: 1934-10-10  Ethan Mcneil is a 89 y.o. year old male who sees Ethan Mcneil, Ethan Rod, MD for primary care. I spoke with  Ethan Mcneil by phone today.  What matters to the patients health and wellness today?  Ethan Mcneil reports he has improved, but also states he would like to be further along in his progress of doing things. 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 He is without questions or concerns at this time.  Goals Addressed             This Visit's Progress    Continue to improve post hospitalization       Interventions Today    Flowsheet Row Most Recent Value  Chronic Disease   Chronic disease during today's visit Congestive Heart Failure (CHF)  [Admit 03/17/23-03/26/23 with acute on chronic resporatory failure, HF exacerbation and 04/12/23-04/19/23 CAD]  General Interventions   General Interventions Discussed/Reviewed General Interventions Reviewed, Doctor Visits  Doctor Visits Discussed/Reviewed Doctor Visits Discussed, Doctor Visits Reviewed, PCP  PCP/Specialist Visits Compliance with follow-up visit  [reviewed scheduled/upcoming appointments]  Exercise Interventions   Exercise Discussed/Reviewed Exercise Reviewed  [Discussed importance of following recommendation per therapist and advised to perform exercises as instructed]  Education Interventions   Education Provided Provided Education  Provided Verbal Education On When to see the doctor, Exercise, Medication  [reviewed signs/symptoms of HF exacerbation. encouraged to follow up with cardiologist if exacerbation noted.]            SDOH assessments and interventions completed:  No  Care Coordination Interventions:  Yes, provided   Follow up plan: Follow up call scheduled for 07/04/23    Encounter Outcome:  Pt. Visit Completed   Ethan Sheriff, RN, MSN, BSN, CCM [facility] Care Coordinator [phone]

## 2023-06-02 NOTE — Patient Instructions (Addendum)
Visit Information  Thank you for taking time to visit with me today. Please don't hesitate to contact me if I can be of assistance to you.   Following are the goals we discussed today:  Continue to take medications as prescribed Continue to attend provider visits as scheduled Continue to weigh self daily. Notify cardiologist if any signs/symptoms of HF exacerbation: weight gain 3 pounds in 24 hour period or 5 pounds in a week. Increased swelling of feet/ankles/stomach/hands; increased Shortness of breath, hard to breath laying down and you have to sit up to breath or increase in number of pillows needed, increased fatigue; you feel something is not right or any questions or concerns.  Our next appointment is by telephone on 07/04/23 at 10:30 am  Please call the care guide team at [phone] if you need to cancel or reschedule your appointment.   If you are experiencing a Mental Health or Behavioral Health Crisis or need someone to talk to, please call the Suicide and Crisis Lifeline: 2  Kathyrn Sheriff, RN, MSN, BSN, CCM [facility] Care Coordinator [phone]

## 2023-06-07 DIAGNOSIS — R079 Chest pain, unspecified: Secondary | ICD-10-CM | POA: Diagnosis not present

## 2023-06-07 DIAGNOSIS — N184 Chronic kidney disease, stage 4 (severe): Secondary | ICD-10-CM | POA: Diagnosis not present

## 2023-06-07 DIAGNOSIS — J811 Chronic pulmonary edema: Secondary | ICD-10-CM | POA: Diagnosis not present

## 2023-06-07 DIAGNOSIS — R0602 Shortness of breath: Secondary | ICD-10-CM | POA: Diagnosis not present

## 2023-06-07 DIAGNOSIS — I214 Non-ST elevation (NSTEMI) myocardial infarction: Secondary | ICD-10-CM | POA: Diagnosis not present

## 2023-06-07 DIAGNOSIS — I130 Hypertensive heart and chronic kidney disease with heart failure and stage 1 through stage 4 chronic kidney disease, or unspecified chronic kidney disease: Secondary | ICD-10-CM | POA: Diagnosis not present

## 2023-06-07 DIAGNOSIS — E1122 Type 2 diabetes mellitus with diabetic chronic kidney disease: Secondary | ICD-10-CM | POA: Diagnosis not present

## 2023-06-07 DIAGNOSIS — J90 Pleural effusion, not elsewhere classified: Secondary | ICD-10-CM | POA: Diagnosis not present

## 2023-06-07 DIAGNOSIS — I10 Essential (primary) hypertension: Secondary | ICD-10-CM | POA: Diagnosis not present

## 2023-06-07 DIAGNOSIS — Z794 Long term (current) use of insulin: Secondary | ICD-10-CM | POA: Diagnosis not present

## 2023-06-07 DIAGNOSIS — I517 Cardiomegaly: Secondary | ICD-10-CM | POA: Diagnosis not present

## 2023-06-07 DIAGNOSIS — R0789 Other chest pain: Secondary | ICD-10-CM | POA: Diagnosis not present

## 2023-06-07 DIAGNOSIS — I509 Heart failure, unspecified: Secondary | ICD-10-CM | POA: Diagnosis not present

## 2023-06-07 DIAGNOSIS — I2510 Atherosclerotic heart disease of native coronary artery without angina pectoris: Secondary | ICD-10-CM | POA: Diagnosis not present

## 2023-06-07 DIAGNOSIS — E785 Hyperlipidemia, unspecified: Secondary | ICD-10-CM | POA: Diagnosis not present

## 2023-06-09 DIAGNOSIS — I129 Hypertensive chronic kidney disease with stage 1 through stage 4 chronic kidney disease, or unspecified chronic kidney disease: Secondary | ICD-10-CM | POA: Diagnosis not present

## 2023-06-09 DIAGNOSIS — I214 Non-ST elevation (NSTEMI) myocardial infarction: Secondary | ICD-10-CM | POA: Diagnosis not present

## 2023-06-09 DIAGNOSIS — Z794 Long term (current) use of insulin: Secondary | ICD-10-CM | POA: Diagnosis not present

## 2023-06-09 DIAGNOSIS — N179 Acute kidney failure, unspecified: Secondary | ICD-10-CM | POA: Diagnosis not present

## 2023-06-09 DIAGNOSIS — I255 Ischemic cardiomyopathy: Secondary | ICD-10-CM | POA: Diagnosis not present

## 2023-06-09 DIAGNOSIS — R0602 Shortness of breath: Secondary | ICD-10-CM | POA: Diagnosis not present

## 2023-06-09 DIAGNOSIS — E785 Hyperlipidemia, unspecified: Secondary | ICD-10-CM | POA: Diagnosis not present

## 2023-06-09 DIAGNOSIS — I420 Dilated cardiomyopathy: Secondary | ICD-10-CM | POA: Diagnosis not present

## 2023-06-09 DIAGNOSIS — I493 Ventricular premature depolarization: Secondary | ICD-10-CM | POA: Diagnosis not present

## 2023-06-09 DIAGNOSIS — I5023 Acute on chronic systolic (congestive) heart failure: Secondary | ICD-10-CM | POA: Diagnosis not present

## 2023-06-09 DIAGNOSIS — N184 Chronic kidney disease, stage 4 (severe): Secondary | ICD-10-CM | POA: Diagnosis not present

## 2023-06-09 DIAGNOSIS — D649 Anemia, unspecified: Secondary | ICD-10-CM | POA: Diagnosis not present

## 2023-06-09 DIAGNOSIS — R079 Chest pain, unspecified: Secondary | ICD-10-CM | POA: Diagnosis not present

## 2023-06-09 DIAGNOSIS — I509 Heart failure, unspecified: Secondary | ICD-10-CM | POA: Diagnosis not present

## 2023-06-09 DIAGNOSIS — J90 Pleural effusion, not elsewhere classified: Secondary | ICD-10-CM | POA: Diagnosis not present

## 2023-06-09 DIAGNOSIS — I110 Hypertensive heart disease with heart failure: Secondary | ICD-10-CM | POA: Diagnosis not present

## 2023-06-09 DIAGNOSIS — I130 Hypertensive heart and chronic kidney disease with heart failure and stage 1 through stage 4 chronic kidney disease, or unspecified chronic kidney disease: Secondary | ICD-10-CM | POA: Diagnosis not present

## 2023-06-09 DIAGNOSIS — E1122 Type 2 diabetes mellitus with diabetic chronic kidney disease: Secondary | ICD-10-CM | POA: Diagnosis not present

## 2023-06-09 DIAGNOSIS — Z951 Presence of aortocoronary bypass graft: Secondary | ICD-10-CM | POA: Diagnosis not present

## 2023-06-09 DIAGNOSIS — I517 Cardiomegaly: Secondary | ICD-10-CM | POA: Diagnosis not present

## 2023-06-09 DIAGNOSIS — I5022 Chronic systolic (congestive) heart failure: Secondary | ICD-10-CM | POA: Diagnosis not present

## 2023-06-09 DIAGNOSIS — I25810 Atherosclerosis of coronary artery bypass graft(s) without angina pectoris: Secondary | ICD-10-CM | POA: Diagnosis not present

## 2023-06-09 DIAGNOSIS — I25719 Atherosclerosis of autologous vein coronary artery bypass graft(s) with unspecified angina pectoris: Secondary | ICD-10-CM | POA: Diagnosis not present

## 2023-06-09 DIAGNOSIS — R9431 Abnormal electrocardiogram [ECG] [EKG]: Secondary | ICD-10-CM | POA: Diagnosis not present

## 2023-06-09 DIAGNOSIS — I2510 Atherosclerotic heart disease of native coronary artery without angina pectoris: Secondary | ICD-10-CM | POA: Diagnosis not present

## 2023-06-10 DIAGNOSIS — I493 Ventricular premature depolarization: Secondary | ICD-10-CM | POA: Diagnosis not present

## 2023-06-10 DIAGNOSIS — N179 Acute kidney failure, unspecified: Secondary | ICD-10-CM | POA: Diagnosis not present

## 2023-06-10 DIAGNOSIS — R9431 Abnormal electrocardiogram [ECG] [EKG]: Secondary | ICD-10-CM | POA: Diagnosis not present

## 2023-06-10 DIAGNOSIS — I214 Non-ST elevation (NSTEMI) myocardial infarction: Secondary | ICD-10-CM | POA: Diagnosis not present

## 2023-06-10 DIAGNOSIS — I129 Hypertensive chronic kidney disease with stage 1 through stage 4 chronic kidney disease, or unspecified chronic kidney disease: Secondary | ICD-10-CM | POA: Diagnosis not present

## 2023-06-10 DIAGNOSIS — I509 Heart failure, unspecified: Secondary | ICD-10-CM | POA: Diagnosis not present

## 2023-06-10 DIAGNOSIS — R079 Chest pain, unspecified: Secondary | ICD-10-CM | POA: Diagnosis not present

## 2023-06-10 DIAGNOSIS — D649 Anemia, unspecified: Secondary | ICD-10-CM | POA: Diagnosis not present

## 2023-06-10 DIAGNOSIS — J90 Pleural effusion, not elsewhere classified: Secondary | ICD-10-CM | POA: Diagnosis not present

## 2023-06-10 DIAGNOSIS — N184 Chronic kidney disease, stage 4 (severe): Secondary | ICD-10-CM | POA: Diagnosis not present

## 2023-06-11 DIAGNOSIS — D649 Anemia, unspecified: Secondary | ICD-10-CM | POA: Diagnosis not present

## 2023-06-11 DIAGNOSIS — I214 Non-ST elevation (NSTEMI) myocardial infarction: Secondary | ICD-10-CM | POA: Diagnosis not present

## 2023-06-11 DIAGNOSIS — I517 Cardiomegaly: Secondary | ICD-10-CM | POA: Diagnosis not present

## 2023-06-11 DIAGNOSIS — I509 Heart failure, unspecified: Secondary | ICD-10-CM | POA: Diagnosis not present

## 2023-06-11 DIAGNOSIS — I129 Hypertensive chronic kidney disease with stage 1 through stage 4 chronic kidney disease, or unspecified chronic kidney disease: Secondary | ICD-10-CM | POA: Diagnosis not present

## 2023-06-11 DIAGNOSIS — N184 Chronic kidney disease, stage 4 (severe): Secondary | ICD-10-CM | POA: Diagnosis not present

## 2023-06-12 ENCOUNTER — Telehealth: Payer: Self-pay | Admitting: Internal Medicine

## 2023-06-12 DIAGNOSIS — I517 Cardiomegaly: Secondary | ICD-10-CM | POA: Diagnosis not present

## 2023-06-12 NOTE — Telephone Encounter (Signed)
FYI

## 2023-06-12 NOTE — Telephone Encounter (Signed)
FYI- Pt's daughter called to advise patient was in the hospital so would not be able to make his appt tomorrow. Pt had to have another stent and balloon put in Monday. Appt cancelled

## 2023-06-13 ENCOUNTER — Ambulatory Visit: Payer: Medicare HMO | Admitting: Internal Medicine

## 2023-06-13 DIAGNOSIS — I129 Hypertensive chronic kidney disease with stage 1 through stage 4 chronic kidney disease, or unspecified chronic kidney disease: Secondary | ICD-10-CM | POA: Diagnosis not present

## 2023-06-13 DIAGNOSIS — I5023 Acute on chronic systolic (congestive) heart failure: Secondary | ICD-10-CM | POA: Diagnosis not present

## 2023-06-13 DIAGNOSIS — I420 Dilated cardiomyopathy: Secondary | ICD-10-CM | POA: Diagnosis not present

## 2023-06-13 DIAGNOSIS — I517 Cardiomegaly: Secondary | ICD-10-CM | POA: Diagnosis not present

## 2023-06-13 DIAGNOSIS — I509 Heart failure, unspecified: Secondary | ICD-10-CM | POA: Diagnosis not present

## 2023-06-13 DIAGNOSIS — N184 Chronic kidney disease, stage 4 (severe): Secondary | ICD-10-CM | POA: Diagnosis not present

## 2023-06-13 DIAGNOSIS — D649 Anemia, unspecified: Secondary | ICD-10-CM | POA: Diagnosis not present

## 2023-06-13 DIAGNOSIS — I25810 Atherosclerosis of coronary artery bypass graft(s) without angina pectoris: Secondary | ICD-10-CM | POA: Diagnosis not present

## 2023-06-13 DIAGNOSIS — I214 Non-ST elevation (NSTEMI) myocardial infarction: Secondary | ICD-10-CM | POA: Diagnosis not present

## 2023-06-16 ENCOUNTER — Encounter: Payer: Self-pay | Admitting: *Deleted

## 2023-06-16 ENCOUNTER — Telehealth: Payer: Self-pay | Admitting: *Deleted

## 2023-06-16 NOTE — Transitions of Care (Post Inpatient/ED Visit) (Signed)
06/16/2023  Name: Ethan Mcneil MRN: [id number] DOB: 1934/11/22  Today's TOC FU Call Status: Today's TOC FU Call Status:: Successful TOC FU Call Competed TOC FU Call Complete Date: 06/16/23  Transition Care Management Follow-up Telephone Call Date of Discharge: 06/13/23 Discharge Facility: Other ([facility]) Name of Other ([facility]) Discharge Facility: [facility] Type of Discharge: Inpatient Admission Primary Inpatient Discharge Diagnosis:: NSTEMI with PCI How have you been since you were released from the hospital?: Better ("I am doing okay.  I was abler to go on all the trips with my family that I had planned.  I am doing fine after this hospital visit; they put a stent in.  I have stopped taking the lisinopril like they told me to") Any questions or concerns?: No  Items Reviewed: Did you receive and understand the discharge instructions provided?: No (briefly reviewed with patient who verbalizes good understanding of same - outside hospital AVS) Medications obtained,verified, and reconciled?: Yes (Medications Reviewed) (Full medication reconciliation/ review completed; no concerns or discrepancies identified; confirmed patient obtained/ is taking all newly Rx'd medications as instructed; self-manages medications and denies questions/ concerns around medications today) Any new allergies since your discharge?: No Dietary orders reviewed?: Yes Type of Diet Ordered:: Heart Healthy, low salt Do you have support at home?: Yes People in Home: alone Name of Support/Comfort Primary Source: Reports independent in self-care activities; supportive local sons assists as/ if needed/ indicated  Medications Reviewed Today: Medications Reviewed Today     Reviewed by Michaela Corner, RN (Registered Nurse) on 06/16/23 at 1337  Med List Status: <None>   Medication Order Taking? Sig Documenting Provider Last Dose Status Informant  acetaminophen (TYLENOL) 500 MG tablet [id number] Yes Take 500  mg by mouth every 4 (four) hours as needed for moderate pain. [provider] Taking Active Self, Child  amLODipine (NORVASC) 10 MG tablet [id number] Yes Take 1 tablet (10 mg total) by mouth daily. Wanda Plump, MD Taking Active   aspirin EC 81 MG tablet [id number] Yes Take 81 mg by mouth at bedtime. [provider] Taking Active Self, Child           Med Note Carola Rhine Jan 07, 2022  9:18 AM)    atorvastatin (LIPITOR) 80 MG tablet [id number] Yes Take 1 tablet (80 mg total) by mouth daily. Rollene Rotunda, MD Taking Active   B-D UF III MINI PEN NEEDLES 31G X 5 MM MISC [id number] Yes USE 3 TIMES A DAY AS DIRECTED Romero Belling, MD Taking Active Self, Child  Cholecalciferol (VITAMIN D3) 50 MCG (2000 UT) capsule [id number] Yes Take 2,000 Units by mouth daily. [provider] Taking Active Self, Child  clopidogrel (PLAVIX) 75 MG tablet [id number] Yes Take 1 tablet (75 mg total) by mouth daily. Rollene Rotunda, MD Taking Active   Continuous Blood Gluc Receiver (FREESTYLE LIBRE 2 READER) DEVI [id number] Yes Use as instructed to check blood sugar Carlus Pavlov, MD Taking Active   Continuous Blood Gluc Sensor (FREESTYLE LIBRE 2 SENSOR) [place] [id number] Yes 1 each by Does not apply route every 14 (fourteen) days. Carlus Pavlov, MD Taking Active   Cyanocobalamin (B-12) 1000 MCG TABS [id number] Yes Take 1,000 mcg by mouth daily. [provider] Taking Active Self, Child  FREESTYLE LITE test strip [id number] Yes CHECK BLOOD SUGAR NO MORE THAN TWICE DAILY. Romero Belling, MD Taking Active Self, Child  hydrALAZINE (APRESOLINE) 100 MG tablet [id number] Yes Take 1 tablet (100 mg total) by mouth 3 (three)  times daily. Rollene Rotunda, MD Taking Active            Med Note Marilu Favre Jun 16, 2023  1:34 PM) 06/16/23: reports during [facility] call checks BP prior to taking-- he then decides whether or not to take around BP readings-- reports takes periodically  insulin aspart  (FIASP) 100 UNIT/ML FlexTouch Pen [id number] Yes Inject 6-8 Units into the skin with breakfast, with lunch, and with evening meal. Carlus Pavlov, MD Taking Active   Insulin Glargine [facility] KWIKPEN) 100 UNIT/ML [id number] Yes Inject 10-12 Units into the skin daily.  Patient taking differently: Inject 6 Units into the skin daily.   Carlus Pavlov, MD Taking Active   isosorbide mononitrate (IMDUR) 120 MG 24 hr tablet [id number] Yes Take 1 tablet (120 mg total) by mouth daily. Sharlene Dory, DO Taking Active   levothyroxine (SYNTHROID) 150 MCG tablet [id number] Yes Take 1 tablet (150 mcg total) by mouth daily before breakfast. Wanda Plump, MD Taking Active   metoprolol succinate (TOPROL-XL) 25 MG 24 hr tablet [id number] Yes TAKE 1 TABLET (25 MG TOTAL) BY MOUTH DAILY. Rollene Rotunda, MD Taking Active   nitroGLYCERIN (NITROSTAT) 0.4 MG SL tablet [id number] Yes Place 1 tablet (0.4 mg total) under the tongue every 5 (five) minutes x 3 doses as needed for chest pain. Azalee Course, [place] Taking Active   Pancrelipase, Lip-Prot-Amyl, (ZENPEP) 25000-79000 units CPEP [id number] Yes Take 2 capsules by mouth with every meal and snacks  Patient taking differently: Take 1 capsule by mouth daily at 12 noon.   Wanda Plump, MD Taking Active Self, Child  pantoprazole (PROTONIX) 40 MG tablet [id number] Yes Take 1 tablet (40 mg total) by mouth daily. Sharlene Dory, DO Taking Active   sodium bicarbonate 650 MG tablet [id number] Yes Take 650 mg by mouth 2 (two) times daily. [provider] Taking Active Self, Child  torsemide (DEMADEX) 20 MG tablet [id number] Yes Take 40 mg by mouth daily. [provider] Taking Active             Home Care and Equipment/Supplies: Were Home Health Services Ordered?: Yes Name of Home Health Agency:: "I can't remember" Has Agency set up a time to come to your home?: Yes First Home Health Visit Date: 06/04/23 (reports home health has been in place since  prior hospital visit: reprorts last home visit 06/04/23 prior to recent hospitalization- states they are to resume this week; confirms has contact information for home health team and will call to confirm) Any new equipment or medical supplies ordered?: No  Functional Questionnaire: Do you need assistance with bathing/showering or dressing?: No (local sons assist as/ if needed) Do you need assistance with meal preparation?: No Do you need assistance with eating?: No Do you have difficulty maintaining continence: No Do you need assistance with getting out of bed/getting out of a chair/moving?: No Do you have difficulty managing or taking your medications?: No (local sons assist as/ if needed)  Follow up appointments reviewed: PCP Follow-up appointment confirmed?: Yes Date of PCP follow-up appointment?: 06/20/23 Follow-up Provider: PCP Specialist Hospital Follow-up appointment confirmed?: NA (verified not indicated per hospital discharging provider discharge notes -- outside hospital records) Do you need transportation to your follow-up appointment?: No Do you understand care options if your condition(s) worsen?: Yes-patient verbalized understanding  SDOH Interventions Today    Flowsheet Row Most Recent Value  SDOH Interventions   Food Insecurity Interventions Intervention Not Indicated  Transportation Interventions Intervention Not  Indicated  [local sons x 2 provide transportation]      TOC Interventions Today    Flowsheet Row Most Recent Value  TOC Interventions   TOC Interventions Discussed/Reviewed TOC Interventions Discussed      Interventions Today    Flowsheet Row Most Recent Value  Chronic Disease   Chronic disease during today's visit Other  [NSTEMI with PCI]  General Interventions   General Interventions Discussed/Reviewed General Interventions Discussed, Doctor Visits, Communication with, Durable Medical Equipment (DME)  Doctor Visits Discussed/Reviewed Doctor  Visits Discussed, PCP  Durable Medical Equipment (DME) BP Cuff, Walker, Other  [has/ uses cane/ walker as indicated, confirmed currently requiring/ using assistive devices]  PCP/Specialist Visits Compliance with follow-up visit  Communication with RN  Edilia Bo currently active with RN CM Care Coordinator-- next outreach scheduled for 07/04/23- encouraged patient to accept call/ remain engaged with RN CM]  Education Interventions   Education Provided Provided Education  Provided Verbal Education On Other  [BP monitoring at home- confirmed patient continues independently monitoring BP's and blood sugars at home]  Nutrition Interventions   Nutrition Discussed/Reviewed Nutrition Discussed  Pharmacy Interventions   Pharmacy Dicussed/Reviewed Pharmacy Topics Discussed  [Full medication review with updating medication list in EHR per patient report]  Safety Interventions   Safety Discussed/Reviewed Safety Discussed      Caryl Pina, RN, BSN, CCRN Alumnus RN CM Care Coordination/ Transition of Care- [facility] Care Management [phone]: direct office

## 2023-06-17 DIAGNOSIS — D631 Anemia in chronic kidney disease: Secondary | ICD-10-CM | POA: Diagnosis not present

## 2023-06-17 DIAGNOSIS — N184 Chronic kidney disease, stage 4 (severe): Secondary | ICD-10-CM | POA: Diagnosis not present

## 2023-06-20 ENCOUNTER — Encounter: Payer: Self-pay | Admitting: Internal Medicine

## 2023-06-20 ENCOUNTER — Ambulatory Visit (INDEPENDENT_AMBULATORY_CARE_PROVIDER_SITE_OTHER): Payer: Medicare HMO | Admitting: Internal Medicine

## 2023-06-20 VITALS — BP 126/60 | HR 55 | Temp 98.0°F | Resp 18 | Ht 70.0 in | Wt 140.1 lb

## 2023-06-20 DIAGNOSIS — I2510 Atherosclerotic heart disease of native coronary artery without angina pectoris: Secondary | ICD-10-CM

## 2023-06-20 DIAGNOSIS — I130 Hypertensive heart and chronic kidney disease with heart failure and stage 1 through stage 4 chronic kidney disease, or unspecified chronic kidney disease: Secondary | ICD-10-CM | POA: Diagnosis not present

## 2023-06-20 DIAGNOSIS — E038 Other specified hypothyroidism: Secondary | ICD-10-CM

## 2023-06-20 DIAGNOSIS — I2583 Coronary atherosclerosis due to lipid rich plaque: Secondary | ICD-10-CM | POA: Diagnosis not present

## 2023-06-20 DIAGNOSIS — D649 Anemia, unspecified: Secondary | ICD-10-CM | POA: Diagnosis not present

## 2023-06-20 LAB — CBC WITH DIFFERENTIAL/PLATELET
Absolute Monocytes: 792 cells/uL (ref 200–950)
MCH: 31.2 pg (ref 27.0–33.0)
MPV: 13.7 fL — ABNORMAL HIGH (ref 7.5–12.5)
Neutro Abs: 3584 cells/uL (ref 1500–7800)
RBC: 2.76 10*6/uL — ABNORMAL LOW (ref 4.20–5.80)
RDW: 13.3 % (ref 11.0–15.0)
WBC: 6.6 10*3/uL (ref 3.8–10.8)

## 2023-06-20 MED ORDER — BASAGLAR KWIKPEN 100 UNIT/ML ~~LOC~~ SOPN: 6.0000 [IU] | PEN_INJECTOR | Freq: Every day | SUBCUTANEOUS | Status: DC

## 2023-06-20 NOTE — Patient Instructions (Addendum)
Vaccines I recommend: Covid booster RSV vaccine Flu shot this fall  Check the  blood pressure regularly BP GOAL is between 110/65 and  135/85. If it is consistently higher or lower, let me know      GO TO THE LAB : Get the blood work     GO TO THE FRONT DESK, PLEASE SCHEDULE YOUR APPOINTMENTS Come back for checkup in 3 months

## 2023-06-20 NOTE — Progress Notes (Unsigned)
Subjective:    Patient ID: Ethan Mcneil, male    DOB: 01/09/1934, 89 y.o.   MRN: [id number]  DOS:  06/20/2023 Type of visit - description: Hospital follow-up, here with his son  Admitted to the hospital and discharge 06/13/2023.    Admitted with a Non-STEMI in the context of recent PCI 6 weeks prior Had a cardiac catheterization 06/09/2023: Drug-eluting stent placed. AKI on CKD.  They were concerned about contrast induced nephropathy as he needed another cardiac catheterization. Anemia: Target hemoglobin 8.0 HTN, lisinopril was held  Cardiac catheterization 624: Drug-eluting stent placed After catheterization, patient is stabilized and was discharged home.  Most recent labs: 06/13/2023: Hemoglobin 8.3, platelets 182. Potassium 3.6 Creatinine: 3.5--3.9--3.8 Chest x-ray: B pleural effusions, improving per last chest x-ray.  Review of Systems Since he left home, in general is better. No further chest pain Edema at baseline. DOE: Mild, improving. No nausea vomiting.  No diarrhea or blood in the stools.    Past Medical History:  Diagnosis Date   Anemia    Arthritis    CAD (coronary artery disease)    CABG 1997; grafts patent @ cath [facility] 11/09   DM2 (diabetes mellitus, type 2) (HCC)    Gout    after tick bite   History of kidney stones    HLD (hyperlipidemia)    HTN (hypertension)    essential nos   Hypothyroid    Pancreatic lesion    Postsurgical aortocoronary bypass status    PVD (peripheral vascular disease) (HCC)    S/P herniorrhaphy    Tick fever 2012   [facility] [place]   Unspecified hyperplasia of prostate without urinary obstruction and other lower urinary tract symptoms (LUTS)    Urolithiasis 2015   hx of   Wears dentures    Wears glasses     Past Surgical History:  Procedure Laterality Date   BIOPSY  08/07/2020   Procedure: BIOPSY;  Surgeon: Lemar Lofty., MD;  Location: [facility] ENDOSCOPY;  Service: Gastroenterology;;   CATARACT  EXTRACTION Bilateral    CORONARY ARTERY BYPASS GRAFT     x7 vessels 1997; cath 2002 and 2009; grafts patent    CYSTOSCOPY WITH RETROGRADE PYELOGRAM, URETEROSCOPY AND STENT PLACEMENT Right 02/21/2014   Procedure: CYSTOSCOPY WITH RIGHT  RETROGRADE PYELOGRAM,RIGHT  URETEROSCOPY WITH STONE BASKETING EXTRACTION;  Surgeon: Bjorn Pippin, MD;  Location: [facility] ORS;  Service: Urology;  Laterality: Right;   ESOPHAGOGASTRODUODENOSCOPY (EGD) WITH PROPOFOL N/A 08/07/2020   Procedure: ESOPHAGOGASTRODUODENOSCOPY (EGD) WITH PROPOFOL;  Surgeon: Meridee Score Netty Starring., MD;  Location: [facility] ENDOSCOPY;  Service: Gastroenterology;  Laterality: N/A;   EUS N/A 08/07/2020   Procedure: UPPER ENDOSCOPIC ULTRASOUND (EUS) LINEAR;  Surgeon: Lemar Lofty., MD;  Location: [facility] ENDOSCOPY;  Service: Gastroenterology;  Laterality: N/A;   FINE NEEDLE ASPIRATION N/A 08/07/2020   Procedure: FINE NEEDLE ASPIRATION (FNA) LINEAR;  Surgeon: Lemar Lofty., MD;  Location: [facility] ENDOSCOPY;  Service: Gastroenterology;  Laterality: N/A;   HERNIA REPAIR     INGUINAL HERNIA REPAIR     LEFT HEART CATH  04/16/2023   LEFT HEART CATH  04/17/2023   MULTIPLE TOOTH EXTRACTIONS     SHOULDER SURGERY     SPLENECTOMY, TOTAL N/A 10/26/2020   Procedure: SPLENECTOMY;  Surgeon: Almond Lint, MD;  Location: [facility] OR;  Service: General;  Laterality: N/A;    Current Outpatient Medications  Medication Instructions   acetaminophen (TYLENOL) 500 mg, Oral, Every 4 hours PRN   amLODipine (NORVASC) 10 mg, Oral, Daily  aspirin EC 81 mg, Oral, Daily at bedtime   atorvastatin (LIPITOR) 80 mg, Oral, Daily   B-12 1,000 mcg, Oral, Daily   B-D UF III MINI PEN NEEDLES 31G X 5 MM MISC USE 3 TIMES A DAY AS DIRECTED   Basaglar KwikPen 6 Units, Subcutaneous, Daily   clopidogrel (PLAVIX) 75 mg, Oral, Daily   Continuous Blood Gluc Receiver (FREESTYLE LIBRE 2 READER) DEVI Use as instructed to check blood sugar   Continuous Blood Gluc Sensor (FREESTYLE LIBRE 2  SENSOR) MISC 1 each, Does not apply, Every 14 days   FREESTYLE LITE test strip CHECK BLOOD SUGAR NO MORE THAN TWICE DAILY.   hydrALAZINE (APRESOLINE) 100 mg, Oral, 3 times daily   insulin aspart (FIASP) 6-8 Units, Subcutaneous, 3 times daily with meals   isosorbide mononitrate (IMDUR) 120 mg, Oral, Daily   levothyroxine (SYNTHROID) 150 mcg, Oral, Daily before breakfast   metoprolol succinate (TOPROL-XL) 25 mg, Oral, Daily   nitroGLYCERIN (NITROSTAT) 0.4 mg, Sublingual, Every 5 min x3 PRN   Pancrelipase, Lip-Prot-Amyl, (ZENPEP) 25000-79000 units CPEP Take 2 capsules by mouth with every meal and snacks   pantoprazole (PROTONIX) 40 mg, Oral, Daily   sodium bicarbonate 650 mg, Oral, 2 times daily   torsemide (DEMADEX) 40 mg, Oral, Daily   Vitamin D3 2,000 Units, Oral, Daily       Objective:   Physical Exam BP 126/60   Pulse (!) 55   Temp 98 F (36.7 C) (Oral)   Resp 18   Ht 5\' 10"  (1.778 m)   Wt 140 lb 2 oz (63.6 kg)   SpO2 94%   BMI 20.11 kg/m  General:   Well developed, NAD, BMI noted. HEENT:  Normocephalic . Face symmetric, atraumatic Lungs:  Slightly decreased breath sounds at the bases. Normal respiratory effort, no intercostal retractions, no accessory muscle use. Heart: Questionable mild diastolic murmur..  Lower extremities: Mild ankle edema, more noticeable on the left, overall looks better compared to previous visits. Skin: Not pale. Not jaundice Neurologic:  alert & oriented X3.  Speech normal, gait appropriate for age, assisted by cane.   Psych--  Cognition and judgment appear intact.  Cooperative with normal attention span and concentration.  Behavior appropriate. No anxious or depressed appearing.      Assessment    Assessment DM -- w/ CAD, no neuropathy, intolerant to metformin , sees ENDO HTN Hyperkalemia-- off  ACEs Hyperlipidemia Hypothyroidism DJD BPH, LUTS,h/o urolithiasis -- sees urology CKD: started to see renal 06/2020 Anemia  CV: --CAD,  CABG 1997, cardiac cath 2009 --PVD (no ABIs in the chart) --L leg edema, chronic, (-) DVT per [place] 2015 -- CHF Gout GI: Pancreatic insufficiency dx 02-2017 B 12 deficiency, mild 2015  Wt loss:  2012 187 lb, 2014 173 lb , 2015 167 01-2017: EGD normal, BX chronic active gastritis. Colonoscopy normal. Saw GI 02-2017: likely d/t Pancreatic insufficiency and poorly controlled diabetes. 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  PNM 20: 09-2021 -Meningococcal: #1 today 11-02-21, #2 02/2022. Boost q 5 years -meningococcal B: 02/2022 and 06-04-2022 - haemophilus influenza:   11-02-21. Completed   PLAN:  CAD, CHF, chronic lower extremity edema:: Recently admitted with non-STEMI, had a cardiac catheterization and PCI.  Currently with no symptoms.  On multiple medications, has follow-up with cardiology already scheduled, he  plans to keep it. AKI and CKD: Kidney function deteriorated, he reports he is producing normal amount of urine and of normal color.  Will check a CMP.  He already has an appointment to see nephrology. Anemia: Checking a CBC, no GI symptoms, hemoglobin goal 8.0.  (06/13/2023: Hemoglobin 8.3 ) DM: On Basaglar 6 units and aspart insulin 6 to 8 units 3  TID.  Reports CBGs all within range (110-170) HTN: BP seems well-controlled, on amlodipine, hydralazine (prn), Imdur, metoprolol, Demadex (typically 1 tablet daily occasional two if edema). Hypothyroidism: Synthroid, check TSH. Social: Lives alone, family is very supportive, is here one of his sons, home health following him.  At this point he states all his needs are met. RTC 3 months

## 2023-06-21 LAB — COMPREHENSIVE METABOLIC PANEL
AG Ratio: 1.4 (calc) (ref 1.0–2.5)
ALT: 15 U/L (ref 9–46)
AST: 15 U/L (ref 10–35)
Albumin: 4 g/dL (ref 3.6–5.1)
Alkaline phosphatase (APISO): 71 U/L (ref 35–144)
BUN/Creatinine Ratio: 13 (calc) (ref 6–22)
BUN: 44 mg/dL — ABNORMAL HIGH (ref 7–25)
CO2: 23 mmol/L (ref 20–32)
Calcium: 8.2 mg/dL — ABNORMAL LOW (ref 8.6–10.3)
Chloride: 98 mmol/L (ref 98–110)
Creat: 3.44 mg/dL — ABNORMAL HIGH (ref 0.70–1.22)
Globulin: 2.8 g/dL (calc) (ref 1.9–3.7)
Glucose, Bld: 230 mg/dL — ABNORMAL HIGH (ref 65–99)
Potassium: 4.8 mmol/L (ref 3.5–5.3)
Sodium: 134 mmol/L — ABNORMAL LOW (ref 135–146)
Total Bilirubin: 0.4 mg/dL (ref 0.2–1.2)
Total Protein: 6.8 g/dL (ref 6.1–8.1)

## 2023-06-21 LAB — CBC WITH DIFFERENTIAL/PLATELET
Basophils Absolute: 119 cells/uL (ref 0–200)
Basophils Relative: 1.8 %
Eosinophils Absolute: 884 cells/uL — ABNORMAL HIGH (ref 15–500)
Eosinophils Relative: 13.4 %
HCT: 26.8 % — ABNORMAL LOW (ref 38.5–50.0)
Hemoglobin: 8.6 g/dL — ABNORMAL LOW (ref 13.2–17.1)
Lymphs Abs: 1221 cells/uL (ref 850–3900)
MCHC: 32.1 g/dL (ref 32.0–36.0)
MCV: 97.1 fL (ref 80.0–100.0)
Monocytes Relative: 12 %
Neutrophils Relative %: 54.3 %
Platelets: 263 10*3/uL (ref 140–400)
Total Lymphocyte: 18.5 %

## 2023-06-21 LAB — TSH: TSH: 2.35 mIU/L (ref 0.40–4.50)

## 2023-06-21 NOTE — Assessment & Plan Note (Signed)
CAD, CHF, chronic lower extremity edema:: Recently admitted with non-STEMI, had a cardiac catheterization and PCI.  Currently with no symptoms.  On multiple medications, has follow-up with cardiology already scheduled, he plans to keep it. AKI and CKD: Kidney function deteriorated, he reports he is producing normal amount of urine and of normal color.  Will check a CMP.  He already has an appointment to see nephrology. Anemia: Checking a CBC, no GI symptoms, hemoglobin goal 8.0.  (06/13/2023: Hemoglobin 8.3 ) DM: On Basaglar 6 units and aspart insulin 6 to 8 units 3  TID.  Reports CBGs all within range (110-170) HTN: BP seems well-controlled, on amlodipine, hydralazine (prn), Imdur, metoprolol, Demadex (typically 1 tablet daily occasional two if edema). Hypothyroidism: Synthroid, check TSH. Social: Lives alone, family is very supportive, is here one of his sons, home health following him.  At this point he states all his needs are met. RTC 3 months

## 2023-07-01 DIAGNOSIS — N184 Chronic kidney disease, stage 4 (severe): Secondary | ICD-10-CM | POA: Diagnosis not present

## 2023-07-01 DIAGNOSIS — D631 Anemia in chronic kidney disease: Secondary | ICD-10-CM | POA: Diagnosis not present

## 2023-07-02 ENCOUNTER — Ambulatory Visit: Payer: Medicare HMO | Admitting: Internal Medicine

## 2023-07-03 ENCOUNTER — Ambulatory Visit (INDEPENDENT_AMBULATORY_CARE_PROVIDER_SITE_OTHER): Payer: Medicare HMO | Admitting: Internal Medicine

## 2023-07-03 ENCOUNTER — Encounter: Payer: Self-pay | Admitting: Internal Medicine

## 2023-07-03 VITALS — BP 140/70 | HR 62 | Ht 70.0 in | Wt 140.6 lb

## 2023-07-03 DIAGNOSIS — E1165 Type 2 diabetes mellitus with hyperglycemia: Secondary | ICD-10-CM | POA: Diagnosis not present

## 2023-07-03 DIAGNOSIS — Z794 Long term (current) use of insulin: Secondary | ICD-10-CM

## 2023-07-03 DIAGNOSIS — E119 Type 2 diabetes mellitus without complications: Secondary | ICD-10-CM

## 2023-07-03 DIAGNOSIS — E1159 Type 2 diabetes mellitus with other circulatory complications: Secondary | ICD-10-CM

## 2023-07-03 DIAGNOSIS — E039 Hypothyroidism, unspecified: Secondary | ICD-10-CM | POA: Diagnosis not present

## 2023-07-03 DIAGNOSIS — E785 Hyperlipidemia, unspecified: Secondary | ICD-10-CM

## 2023-07-03 LAB — HEMOGLOBIN A1C: Hemoglobin A1C: 6.7

## 2023-07-03 NOTE — Patient Instructions (Addendum)
Please continue: - Basaglar 8 units but move this at bedtime - FiAsp 4-6 units right before the meals FiAsp sliding scale for correction: - 150-175: + 1 unit - 176-200: + 2 units - 201-225: + 3 units - 226-250: + 4 units - >250: + 5 units  Please return in 3 to 4 months.

## 2023-07-03 NOTE — Progress Notes (Signed)
Patient ID: Ethan Mcneil, male   DOB: March 13, 1934, 89 y.o.   MRN: [id number]  HPI: Ethan Mcneil is a 89 y.o.-year-old male, returning for follow-up for DM2, dx in 2013, insulin-dependent since 2017, uncontrolled, with complications (CAD, CHF, PVD, CKD, peripheral neuropathy). Pt. previously saw Dr. Everardo All, but last visit with me 3 months ago.  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   Interim history: No increased urination, blurry vision, nausea, chest pain.  Reviewed HbA1c: Lab Results  Component Value Date   HGBA1C 6.6 (A) 03/12/2023   HGBA1C 10.1 (A) 12/11/2022   HGBA1C 11.9 (A) 08/08/2022   HGBA1C 8.0 (H) 04/15/2022   HGBA1C 6.4 (A) 12/21/2021   HGBA1C 5.9 (A) 08/21/2021   HGBA1C 6.6 (A) 04/17/2021   HGBA1C 7.1 (A) 01/08/2021   HGBA1C 7.4 (A) 09/04/2020   HGBA1C 9.4 (A) 07/04/2020   At last visit he was on: - Basaglar 7 units at bedtime >> off x2 weeks as he was not able to get a refill >> restarted 12 units daily at night - Humalog 3x a day, before meals: 6-6-6 >> 8-10 units before meals  I advised him to change to: - Basaglar: 10 units am >> 8 units- still in am - Novolog: 6-8 units >> 8 units 15 minutes before meals >> 4 to 6 units before meals He can also use the following NovoLog sliding scale for correction: - 150-175: + 1 unit - 176-200: + 2 units - 201-225: + 3 units - 226-250: + 4 units - >250: + 5 units  Pt checks his sugars 4x a day and they are (freestyle libre 14 CGM - 28$ per month from the pharmacy):  Previously:  Prev.: - am: HI now (before stopping insulin: 150s) >> 43-347 - 2h after b'fast: n/c - before lunch: n/c >> 48-260 - 2h after lunch: n/c - before dinner: n/c >> 68-292 - 2h after dinner: (before stopping insulin:110-120) >> 64, 143 - bedtime: n/c - nighttime: n/c Lowest sugar was 110 >> 43 >> 50s >> 57; he has hypoglycemia  awareness at 70.  Highest sugar was HI >> 700s >> 200s >> 250s. In 2023, he was admitted with viral gastroenteritis and very high blood sugars, in the 700s, but no DKA.   Glucometer: Freestyle lite  Pt's meals are: - Breakfast: cereal or egg bisquit - Lunch: veggies - Dinner: tomato sandwich, hamburger - Snacks: no sodas; gatorade 0, milk 2%  - 3 glasses, 1 Ensure, fruit smoothie  - + CKD, last BUN/creatinine:  Lab Results  Component Value Date   BUN 44 (H) 06/20/2023   BUN 45 (A) 04/29/2023   CREATININE 3.44 (H) 06/20/2023   CREATININE 3.2 (A) 04/29/2023  He is not on an ACE inhibitor/ARB.  - + HL; last set of lipids: Lab Results  Component Value Date   CHOL 116 04/22/2022   HDL 57 04/22/2022   LDLCALC 45 04/22/2022   TRIG 69 04/22/2022   CHOLHDL 2.0 04/22/2022  On Zocor 20 mg daily.  - last eye exam was in 04/2022. No DR reportedly.   - + numbness and tingling in his feet.  Last foot exam 08/21/2021. He has significant swelling in the L>R leg and wears compression hoses.  He also has a history of HTN, gout, anemia of chronic disease, nephrolithiasis, hypothyroidism.  He is on levothyroxine 150 mcg daily: - in am - fasting -  at least 1h from b'fast - no calcium - no iron - no multivitamins - no PPIs (only as needed)  Latest TSH levels reviewed: Lab Results  Component Value Date   TSH 2.35 06/20/2023   TSH 3.22 11/26/2022   TSH 1.785 04/05/2022   TSH 2.20 12/27/2021   TSH 4.12 11/12/2021   TSH 9.19 (H) 11/02/2021   TSH 10.37 (H) 09/25/2021   TSH 3.20 05/11/2021   TSH 1.64 02/16/2021   TSH 4.74 (H) 01/04/2021   ROS: + see HPI  Past Medical History:  Diagnosis Date   Anemia    Arthritis    CAD (coronary artery disease)    CABG 1997; grafts patent @ cath [facility] 11/09   DM2 (diabetes mellitus, type 2) (HCC)    Gout    after tick bite   History of kidney stones    HLD (hyperlipidemia)    HTN (hypertension)    essential nos   Hypothyroid     Pancreatic lesion    Postsurgical aortocoronary bypass status    PVD (peripheral vascular disease) (HCC)    S/P herniorrhaphy    Tick fever 2012   [facility] [place]   Unspecified hyperplasia of prostate without urinary obstruction and other lower urinary tract symptoms (LUTS)    Urolithiasis 2015   hx of   Wears dentures    Wears glasses    Past Surgical History:  Procedure Laterality Date   BIOPSY  08/07/2020   Procedure: BIOPSY;  Surgeon: Lemar Lofty., MD;  Location: [facility] ENDOSCOPY;  Service: Gastroenterology;;   CATARACT EXTRACTION Bilateral    CORONARY ARTERY BYPASS GRAFT     x7 vessels 1997; cath 2002 and 2009; grafts patent    CYSTOSCOPY WITH RETROGRADE PYELOGRAM, URETEROSCOPY AND STENT PLACEMENT Right 02/21/2014   Procedure: CYSTOSCOPY WITH RIGHT  RETROGRADE PYELOGRAM,RIGHT  URETEROSCOPY WITH STONE BASKETING EXTRACTION;  Surgeon: Bjorn Pippin, MD;  Location: [facility] ORS;  Service: Urology;  Laterality: Right;   ESOPHAGOGASTRODUODENOSCOPY (EGD) WITH PROPOFOL N/A 08/07/2020   Procedure: ESOPHAGOGASTRODUODENOSCOPY (EGD) WITH PROPOFOL;  Surgeon: Meridee Score Netty Starring., MD;  Location: [facility] ENDOSCOPY;  Service: Gastroenterology;  Laterality: N/A;   EUS N/A 08/07/2020   Procedure: UPPER ENDOSCOPIC ULTRASOUND (EUS) LINEAR;  Surgeon: Lemar Lofty., MD;  Location: [facility] ENDOSCOPY;  Service: Gastroenterology;  Laterality: N/A;   FINE NEEDLE ASPIRATION N/A 08/07/2020   Procedure: FINE NEEDLE ASPIRATION (FNA) LINEAR;  Surgeon: Lemar Lofty., MD;  Location: [facility] ENDOSCOPY;  Service: Gastroenterology;  Laterality: N/A;   HERNIA REPAIR     INGUINAL HERNIA REPAIR     LEFT HEART CATH  04/16/2023   LEFT HEART CATH  04/17/2023   MULTIPLE TOOTH EXTRACTIONS     SHOULDER SURGERY     SPLENECTOMY, TOTAL N/A 10/26/2020   Procedure: SPLENECTOMY;  Surgeon: Almond Lint, MD;  Location: [facility] OR;  Service: General;  Laterality: N/A;   Social History   Socioeconomic History    Marital status: Married    Spouse name: Ethan Mcneil   Number of children: 4   Years of education: Not on file   Highest education level: Not on file  Occupational History   Occupation: retired, works few hours   Tobacco Use   Smoking status: Never   Smokeless tobacco: Never  Vaping Use   Vaping status: Never Used  Substance and Sexual Activity   Alcohol use: No   Drug use: No   Sexual activity: Not Currently  Other Topics Concern   Not on file  Social  History Narrative   Lives alone   Drives   Son Maurine Minister)  lives near by ; other son lives 1 hour away       Patient Curator; aviator and former race car driver   4 children (boys)      Social Determinants of Health   Financial Resource Strain: Low Risk  (06/13/2023)   Received from [facility]   Overall Financial Resource Strain (CARDIA)    Difficulty of Paying Living Expenses: Not hard at all  Food Insecurity: No Food Insecurity (06/16/2023)   Hunger Vital Sign    Worried About Running Out of Food in the Last Year: Never true    Ran Out of Food in the Last Year: Never true  Transportation Needs: No Transportation Needs (06/16/2023)   PRAPARE - Administrator, Civil Service (Medical): No    Lack of Transportation (Non-Medical): No  Physical Activity: Insufficiently Active (07/16/2022)   Exercise Vital Sign    Days of Exercise per Week: 5 days    Minutes of Exercise per Session: 20 min  Stress: No Stress Concern Present (07/16/2022)   [employer] of Occupational Health - Occupational Stress Questionnaire    Feeling of Stress : Not at all  Social Connections: Moderately Isolated (07/16/2022)   Social Connection and Isolation Panel [NHANES]    Frequency of Communication with Friends and Family: More than three times a week    Frequency of Social Gatherings with Friends and Family: More than three times a week    Attends Religious Services: More than 4 times per year    Active Member of [employer] or  Organizations: No    Attends Banker Meetings: Never    Marital Status: Widowed  Intimate Partner Violence: Not At Risk (07/16/2022)   Humiliation, Afraid, Rape, and Kick questionnaire    Fear of Current or Ex-Partner: No    Emotionally Abused: No    Physically Abused: No    Sexually Abused: No   Current Outpatient Medications on File Prior to Visit  Medication Sig Dispense Refill   acetaminophen (TYLENOL) 500 MG tablet Take 500 mg by mouth every 4 (four) hours as needed for moderate pain.     amLODipine (NORVASC) 10 MG tablet Take 1 tablet (10 mg total) by mouth daily. 90 tablet 1   aspirin EC 81 MG tablet Take 81 mg by mouth at bedtime.     atorvastatin (LIPITOR) 80 MG tablet Take 1 tablet (80 mg total) by mouth daily. 90 tablet 3   B-D UF III MINI PEN NEEDLES 31G X 5 MM MISC USE 3 TIMES A DAY AS DIRECTED 100 each 2   Cholecalciferol (VITAMIN D3) 50 MCG (2000 UT) capsule Take 2,000 Units by mouth daily.     clopidogrel (PLAVIX) 75 MG tablet Take 1 tablet (75 mg total) by mouth daily. 90 tablet 1   Continuous Blood Gluc Receiver (FREESTYLE LIBRE 2 READER) DEVI Use as instructed to check blood sugar 1 each 0   Continuous Blood Gluc Sensor (FREESTYLE LIBRE 2 SENSOR) MISC 1 each by Does not apply route every 14 (fourteen) days. 6 each 3   Cyanocobalamin (B-12) 1000 MCG TABS Take 1,000 mcg by mouth daily.     FREESTYLE LITE test strip CHECK BLOOD SUGAR NO MORE THAN TWICE DAILY. 200 strip 0   hydrALAZINE (APRESOLINE) 100 MG tablet Take 1 tablet (100 mg total) by mouth 3 (three) times daily. 270 tablet 3   insulin  aspart (FIASP) 100 UNIT/ML FlexTouch Pen Inject 6-8 Units into the skin with breakfast, with lunch, and with evening meal. 15 mL 1   Insulin Glargine (BASAGLAR KWIKPEN) 100 UNIT/ML Inject 6 Units into the skin daily.     isosorbide mononitrate (IMDUR) 120 MG 24 hr tablet Take 1 tablet (120 mg total) by mouth daily. 90 tablet 2   levothyroxine (SYNTHROID) 150 MCG tablet Take  1 tablet (150 mcg total) by mouth daily before breakfast. 90 tablet 1   metoprolol succinate (TOPROL-XL) 25 MG 24 hr tablet TAKE 1 TABLET (25 MG TOTAL) BY MOUTH DAILY. 90 tablet 1   nitroGLYCERIN (NITROSTAT) 0.4 MG SL tablet Place 1 tablet (0.4 mg total) under the tongue every 5 (five) minutes x 3 doses as needed for chest pain. (Patient not taking: Reported on 06/20/2023) 25 tablet 2   Pancrelipase, Lip-Prot-Amyl, (ZENPEP) 25000-79000 units CPEP Take 2 capsules by mouth with every meal and snacks (Patient taking differently: Take 1 capsule by mouth daily at 12 noon.) 540 capsule 1   pantoprazole (PROTONIX) 40 MG tablet Take 1 tablet (40 mg total) by mouth daily. 90 tablet 1   sodium bicarbonate 650 MG tablet Take 650 mg by mouth 2 (two) times daily.     torsemide (DEMADEX) 20 MG tablet Take 40 mg by mouth daily.     No current facility-administered medications on file prior to visit.   Allergies  Allergen Reactions   Ciprofloxacin Itching   Codeine Nausea And Vomiting   Family History  Problem Relation Age of Onset   Lung cancer Father    Coronary artery disease Mother        stent   Diabetes Brother    Prostate cancer Maternal Uncle        in his 51s   Stroke Son        GF   Throat cancer Son    Colon cancer Neg Hx    Rectal cancer Neg Hx    Stomach cancer Neg Hx    Esophageal cancer Neg Hx    Pancreatic cancer Neg Hx    PE: BP (!) 140/70   Pulse 62   Ht 5\' 10"  (1.778 m)   Wt 140 lb 9.6 oz (63.8 kg)   SpO2 96%   BMI 20.17 kg/m  Wt Readings from Last 3 Encounters:  07/03/23 140 lb 9.6 oz (63.8 kg)  06/20/23 140 lb 2 oz (63.6 kg)  05/20/23 136 lb 6.4 oz (61.9 kg)   Constitutional: under weight, in NAD Eyes: no exophthalmos ENT:  no thyromegaly, no cervical lymphadenopathy Cardiovascular: RRR, No MRG, + L>R LE edema Respiratory: CTA B Musculoskeletal: no deformities Skin: no rashes Neurological: no tremor with outstretched hands Diabetic Foot Exam - Simple   Simple  Foot Form Diabetic Foot exam was performed with the following findings: Yes 07/03/2023  8:49 AM  Visual Inspection See comments: Yes Sensation Testing Intact to touch and monofilament testing bilaterally: Yes Pulse Check See comments: Yes Comments Significant B LE pitting edema Dorsalis pedis pulses barely palpable    ASSESSMENT: 1. DM2, insulin-dependent, uncontrolled, with complications - CAD, h/o CABG - CHF - PVD - CKD stage IV - Peripheral neuropathy - h/o hypoglycemia x1 in 2021  2. HL  3.  Hypothyroidism  PLAN:  1. Patient with longstanding, uncontrolled, insulin-dependent diabetes, on basal bolus insulin regimen, with suboptimal control.  However, at last visit, HbA1c was better, at 6.6%.  Sugars were fluctuating at the lower limit of the target  range with occasional drops in the 50s or lower during the day and increasing gradually overnight, peaking between 7 and 8 AM.  We discussed that this was a sign of not enough basal insulin and too much insulin during the day.  Therefore, we decreased the dose of his Basaglar and moved it at night and backed off Fiasp doses.  I also advised him to move Fiasp at the start of the meal, as he was taking it 15 minutes before meals. CGM interpretation: -At today's visit, we reviewed his CGM downloads: It appears that 51% of values are in target range (goal >70%), while 46% are higher than 180 (goal <25%), and 3% are lower than 70 (goal <4%).  The calculated average blood sugar is 176.  The projected HbA1c for the next 3 months (GMI) is 7.5%. -Reviewing the CGM trends, sugars appear to be higher than before, with the majority of the blood sugars being elevated during the night and the first half of the day, after which, sugars improved, with even occasional lows in the afternoon and evening.  Upon questioning, he forgot to Basaglar dose at night and he still takes it in the morning.  I believe that this may be the reason for the blood sugars  being high overnight and low during the day.  I advised him to move the Basaglar to evening.  I do not feel that we absolutely need to change the doses of the insulins for now, but I did advise him to let me know if he has any more lows. - I suggested to:  Patient Instructions  Please continue: - Basaglar 8 units but move this at bedtime - FiAsp 4-6 units right before the meals FiAsp sliding scale for correction: - 150-175: + 1 unit - 176-200: + 2 units - 201-225: + 3 units - 226-250: + 4 units - >250: + 5 units  Please return in 3 to 4 months.  - we checked his HbA1c: 6.7% (slightly higher) - advised to check sugars at different times of the day - 4x a day, rotating check times - advised for yearly eye exams >> he is not UTD -  at last visit, he had compression hoses on and I could not do a foot exam - this was checked today - return to clinic in 3-4 months  2. HL -Reviewed latest lipid panel from 04/2022: Fractions at goal Lab Results  Component Value Date   CHOL 116 04/22/2022   HDL 57 04/22/2022   LDLCALC 45 04/22/2022   TRIG 69 04/22/2022   CHOLHDL 2.0 04/22/2022  -He is on Zocor 20 mg daily without side effects -He is due for another lipid panel -will check this at next visit  3.  Hypothyroidism - latest thyroid labs reviewed with pt. >> normal: Lab Results  Component Value Date   TSH 2.35 06/20/2023  - he continues on LT4 150 mcg daily - pt feels good on this dose. - we discussed about taking the thyroid hormone every day, with water, >30 minutes before breakfast, separated by >4 hours from acid reflux medications, calcium, iron, multivitamins. Pt. is taking it correctly.  Carlus Pavlov, MD PhD [facility] Endocrinology

## 2023-07-11 ENCOUNTER — Telehealth: Payer: Self-pay

## 2023-07-11 NOTE — Telephone Encounter (Signed)
Plan of care signed (order number: [id number]) and faxed back to [facility] at [phone]. Form sent for scanning.

## 2023-07-14 ENCOUNTER — Telehealth: Payer: Self-pay

## 2023-07-14 NOTE — Telephone Encounter (Signed)
Physician orders (order number: [id number]) signed and faxed back to [facility] at [phone]. Form sent for scanning.

## 2023-07-15 DIAGNOSIS — N184 Chronic kidney disease, stage 4 (severe): Secondary | ICD-10-CM | POA: Diagnosis not present

## 2023-07-15 DIAGNOSIS — D631 Anemia in chronic kidney disease: Secondary | ICD-10-CM | POA: Diagnosis not present

## 2023-07-18 ENCOUNTER — Ambulatory Visit: Payer: Medicare HMO | Admitting: *Deleted

## 2023-07-18 VITALS — BP 127/59 | HR 65 | Ht 70.0 in | Wt 135.0 lb

## 2023-07-18 DIAGNOSIS — Z0000 Encounter for general adult medical examination without abnormal findings: Secondary | ICD-10-CM | POA: Diagnosis not present

## 2023-07-18 NOTE — Progress Notes (Signed)
Subjective:   Ethan Mcneil is a 89 y.o. male who presents for Medicare Annual/Subsequent preventive examination.  Visit Complete: Virtual  I connected with  Ethan Mcneil on 07/18/23 by a audio enabled telemedicine application and verified that I am speaking with the correct person using two identifiers.  Patient Location: Home  Provider Location: Office/Clinic  I discussed the limitations of evaluation and management by telemedicine. The patient expressed understanding and agreed to proceed.  Review of Systems     Cardiac Risk Factors include: advanced age (>26men, >31 women);male gender;diabetes mellitus;dyslipidemia;hypertension     Objective:   Pt reported vital signs. Today's Vitals   07/18/23 0916  BP: (!) 127/59  Pulse: 65  Weight: 135 lb (61.2 kg)  Height: 5\' 10"  (1.778 m)   Body mass index is 19.37 kg/m.     07/18/2023    9:01 AM 07/16/2022    2:04 PM 04/04/2022    7:00 PM 12/28/2021    3:00 PM 05/24/2021    9:01 AM 10/27/2020    7:55 PM 10/23/2020   10:23 AM  Advanced Directives  Does Patient Have a Medical Advance Directive? Yes Yes Yes No Yes Yes Yes  Type of Estate agent of [place];Living will Healthcare Power of [place];Living will Living will  Healthcare Power of [place];Living will Living will Living will  Does patient want to make changes to medical advance directive? No - Patient declined No - Patient declined No - Patient declined   No - Patient declined No - Patient declined  Copy of Healthcare Power of Attorney in Chart? Yes - validated most recent copy scanned in chart (See row information) Yes - validated most recent copy scanned in chart (See row information)   Yes - validated most recent copy scanned in chart (See row information)    Would patient like information on creating a medical advance directive?    No - Patient declined       Current Medications (verified) Outpatient Encounter Medications as of 07/18/2023   Medication Sig   acetaminophen (TYLENOL) 500 MG tablet Take 500 mg by mouth every 4 (four) hours as needed for moderate pain.   amLODipine (NORVASC) 10 MG tablet Take 1 tablet (10 mg total) by mouth daily.   aspirin EC 81 MG tablet Take 81 mg by mouth at bedtime.   atorvastatin (LIPITOR) 80 MG tablet Take 1 tablet (80 mg total) by mouth daily.   B-D UF III MINI PEN NEEDLES 31G X 5 MM MISC USE 3 TIMES A DAY AS DIRECTED   Cholecalciferol (VITAMIN D3) 50 MCG (2000 UT) capsule Take 2,000 Units by mouth daily.   clopidogrel (PLAVIX) 75 MG tablet Take 1 tablet (75 mg total) by mouth daily.   Continuous Blood Gluc Receiver (FREESTYLE LIBRE 2 READER) DEVI Use as instructed to check blood sugar   Continuous Blood Gluc Sensor (FREESTYLE LIBRE 2 SENSOR) MISC 1 each by Does not apply route every 14 (fourteen) days.   Cyanocobalamin (B-12) 1000 MCG TABS Take 1,000 mcg by mouth daily.   FREESTYLE LITE test strip CHECK BLOOD SUGAR NO MORE THAN TWICE DAILY.   hydrALAZINE (APRESOLINE) 100 MG tablet Take 1 tablet (100 mg total) by mouth 3 (three) times daily.   insulin aspart (FIASP) 100 UNIT/ML FlexTouch Pen Inject 6-8 Units into the skin with breakfast, with lunch, and with evening meal.   Insulin Glargine (BASAGLAR KWIKPEN) 100 UNIT/ML Inject 6 Units into the skin daily.   isosorbide mononitrate (IMDUR) 120  MG 24 hr tablet Take 1 tablet (120 mg total) by mouth daily.   levothyroxine (SYNTHROID) 150 MCG tablet Take 1 tablet (150 mcg total) by mouth daily before breakfast.   metoprolol succinate (TOPROL-XL) 25 MG 24 hr tablet TAKE 1 TABLET (25 MG TOTAL) BY MOUTH DAILY.   nitroGLYCERIN (NITROSTAT) 0.4 MG SL tablet Place 1 tablet (0.4 mg total) under the tongue every 5 (five) minutes x 3 doses as needed for chest pain.   Pancrelipase, Lip-Prot-Amyl, (ZENPEP) 25000-79000 units CPEP Take 2 capsules by mouth with every meal and snacks (Patient taking differently: Take 1 capsule by mouth daily at 12 noon.)    pantoprazole (PROTONIX) 40 MG tablet Take 1 tablet (40 mg total) by mouth daily.   sodium bicarbonate 650 MG tablet Take 650 mg by mouth 2 (two) times daily.   torsemide (DEMADEX) 20 MG tablet Take 40 mg by mouth daily.   No facility-administered encounter medications on file as of 07/18/2023.    Allergies (verified) Ciprofloxacin and Codeine   History: Past Medical History:  Diagnosis Date   Anemia    Arthritis    CAD (coronary artery disease)    CABG 1997; grafts patent @ cath [facility] 11/09   DM2 (diabetes mellitus, type 2) (HCC)    Gout    after tick bite   History of kidney stones    HLD (hyperlipidemia)    HTN (hypertension)    essential nos   Hypothyroid    Pancreatic lesion    Postsurgical aortocoronary bypass status    PVD (peripheral vascular disease) (HCC)    S/P herniorrhaphy    Tick fever 2012   [facility] [place]   Unspecified hyperplasia of prostate without urinary obstruction and other lower urinary tract symptoms (LUTS)    Urolithiasis 2015   hx of   Wears dentures    Wears glasses    Past Surgical History:  Procedure Laterality Date   BIOPSY  08/07/2020   Procedure: BIOPSY;  Surgeon: Lemar Lofty., MD;  Location: [facility] ENDOSCOPY;  Service: Gastroenterology;;   CATARACT EXTRACTION Bilateral    CORONARY ARTERY BYPASS GRAFT     x7 vessels 1997; cath 2002 and 2009; grafts patent    CYSTOSCOPY WITH RETROGRADE PYELOGRAM, URETEROSCOPY AND STENT PLACEMENT Right 02/21/2014   Procedure: CYSTOSCOPY WITH RIGHT  RETROGRADE PYELOGRAM,RIGHT  URETEROSCOPY WITH STONE BASKETING EXTRACTION;  Surgeon: Bjorn Pippin, MD;  Location: [facility] ORS;  Service: Urology;  Laterality: Right;   ESOPHAGOGASTRODUODENOSCOPY (EGD) WITH PROPOFOL N/A 08/07/2020   Procedure: ESOPHAGOGASTRODUODENOSCOPY (EGD) WITH PROPOFOL;  Surgeon: Meridee Score Netty Starring., MD;  Location: [facility] ENDOSCOPY;  Service: Gastroenterology;  Laterality: N/A;   EUS N/A 08/07/2020   Procedure: UPPER ENDOSCOPIC  ULTRASOUND (EUS) LINEAR;  Surgeon: Lemar Lofty., MD;  Location: [facility] ENDOSCOPY;  Service: Gastroenterology;  Laterality: N/A;   FINE NEEDLE ASPIRATION N/A 08/07/2020   Procedure: FINE NEEDLE ASPIRATION (FNA) LINEAR;  Surgeon: Lemar Lofty., MD;  Location: [facility] ENDOSCOPY;  Service: Gastroenterology;  Laterality: N/A;   HERNIA REPAIR     INGUINAL HERNIA REPAIR     LEFT HEART CATH  04/16/2023   LEFT HEART CATH  04/17/2023   MULTIPLE TOOTH EXTRACTIONS     SHOULDER SURGERY     SPLENECTOMY, TOTAL N/A 10/26/2020   Procedure: SPLENECTOMY;  Surgeon: Almond Lint, MD;  Location: [facility] OR;  Service: General;  Laterality: N/A;   Family History  Problem Relation Age of Onset   Lung cancer Father    Coronary artery disease  Mother        stent   Diabetes Brother    Prostate cancer Maternal Uncle        in his 75s   Stroke Son        GF   Throat cancer Son    Colon cancer Neg Hx    Rectal cancer Neg Hx    Stomach cancer Neg Hx    Esophageal cancer Neg Hx    Pancreatic cancer Neg Hx    Social History   Socioeconomic History   Marital status: Married    Spouse name: Garment/textile technologist   Number of children: 4   Years of education: Not on file   Highest education level: Not on file  Occupational History   Occupation: retired, works few hours   Tobacco Use   Smoking status: Never   Smokeless tobacco: Never  Vaping Use   Vaping status: Never Used  Substance and Sexual Activity   Alcohol use: No   Drug use: No   Sexual activity: Not Currently  Other Topics Concern   Not on file  Social History Narrative   Lives alone   Drives   Son Maurine Minister)  lives near by ; other son lives 1 hour away       Scientist, research (physical sciences); Programmer, applications and former race Market researcher   4 children (boys)      Social Determinants of Health   Financial Resource Strain: Low Risk  (07/18/2023)   Overall Financial Resource Strain (CARDIA)    Difficulty of Paying Living Expenses: Not hard at all  Food Insecurity: No Food  Insecurity (06/16/2023)   Hunger Vital Sign    Worried About Running Out of Food in the Last Year: Never true    Ran Out of Food in the Last Year: Never true  Transportation Needs: No Transportation Needs (06/16/2023)   PRAPARE - Administrator, Civil Service (Medical): No    Lack of Transportation (Non-Medical): No  Physical Activity: Inactive (07/18/2023)   Exercise Vital Sign    Days of Exercise per Week: 0 days    Minutes of Exercise per Session: 0 min  Stress: No Stress Concern Present (07/18/2023)   [employer] of Occupational Health - Occupational Stress Questionnaire    Feeling of Stress : Not at all  Social Connections: Moderately Integrated (07/18/2023)   Social Connection and Isolation Panel [NHANES]    Frequency of Communication with Friends and Family: More than three times a week    Frequency of Social Gatherings with Friends and Family: More than three times a week    Attends Religious Services: More than 4 times per year    Active Member of [employer] or Organizations: Yes    Attends Banker Meetings: More than 4 times per year    Marital Status: Widowed    Tobacco Counseling Counseling given: Not Answered   Clinical Intake:  Pre-visit preparation completed: Yes  Pain : No/denies pain  Nutritional Risks: None Diabetes: Yes CBG done?: No Did pt. bring in CBG monitor from home?: No  How often do you need to have someone help you when you read instructions, pamphlets, or other written materials from your doctor or pharmacy?: 1 - Never  Interpreter Needed?: No  Information entered by :: Donne Anon, CMA   Activities of Daily Living    07/18/2023    9:03 AM  In your present state of health, do you have any difficulty performing the following activities:  Hearing? 1  Comment does not wear hearing aids  Vision? 0  Difficulty concentrating or making decisions? 0  Walking or climbing stairs? 1  Dressing or bathing? 0  Doing errands,  shopping? 0  Preparing Food and eating ? N  Using the Toilet? N  In the past six months, have you accidently leaked urine? N  Do you have problems with loss of bowel control? N  Managing your Medications? N  Managing your Finances? N  Housekeeping or managing your Housekeeping? N    Patient Care Team: Wanda Plump, MD as PCP - General (Internal Medicine) Rollene Rotunda, MD as PCP - Cardiology (Cardiology) Jerilee Field, MD as Consulting Physician (Urology) Wendall Stade, MD as Consulting Physician (Cardiology) Hilarie Fredrickson, MD as Consulting Physician (Gastroenterology) Yvette Rack, OD as Consulting Physician (Optometry) Maxie Barb, MD as Consulting Physician (Nephrology) Colletta [place], RN as [facility] Care Management  Indicate any recent Medical Services you may have received from other than [facility] providers in the past year (date may be approximate).     Assessment:   This is a routine wellness examination for [place].  Hearing/Vision screen No results found.  Dietary issues and exercise activities discussed:     Goals Addressed   None    Depression Screen    07/18/2023    9:08 AM 06/20/2023    1:23 PM 04/25/2023    8:52 AM 02/07/2023    9:00 AM 11/26/2022    8:00 AM 10/07/2022    8:45 AM 07/16/2022    2:04 PM  PHQ 2/9 Scores  PHQ - 2 Score 0 0 0 0 0 0 0    Fall Risk    07/18/2023    9:02 AM 06/20/2023    1:23 PM 04/25/2023    8:52 AM 02/07/2023    9:00 AM 11/26/2022    7:54 AM  Fall Risk   Falls in the past year? 0 0 0 0 0  Number falls in past yr: 0 0 0 0 0  Injury with Fall? 0 0 0 0 0  Risk for fall due to : Impaired balance/gait      Follow up Falls evaluation completed Falls evaluation completed Falls evaluation completed Falls evaluation completed Falls evaluation completed    MEDICARE RISK AT HOME:  Medicare Risk at Home - 07/18/23 0905     Any stairs in or around the home? Yes    If so, are there any without handrails?  No    Home free of loose throw rugs in walkways, pet beds, electrical cords, etc? Yes    Adequate lighting in your home to reduce risk of falls? Yes    Life alert? No    Use of a cane, walker or w/c? Yes    Grab bars in the bathroom? No    Shower chair or bench in shower? Yes    Elevated toilet seat or a handicapped toilet? Yes   comfort height            TIMED UP AND GO:  Was the test performed?  No    Cognitive Function:    05/18/2018    8:25 AM 05/15/2017    8:25 AM  MMSE - Mini Mental State Exam  Orientation to time 5 5  Orientation to Place 5 5  Registration 3 3  Attention/ Calculation 5 4  Recall 3 2  Language- name 2 objects 2 2  Language- repeat 1 0  Language- follow 3 step  command 3 3  Language- read & follow direction 1 1  Write a sentence 1 1  Copy design 1 1  Total score 30 27        07/18/2023    9:10 AM 07/16/2022    2:12 PM 05/24/2021    9:08 AM 05/22/2020    8:58 AM  6CIT Screen  What Year? 0 points 0 points 0 points 0 points  What month? 0 points 0 points 0 points 0 points  What time? 0 points 0 points 0 points 0 points  Count back from 20 0 points 0 points 0 points 2 points  Months in reverse 0 points 0 points 0 points 0 points  Repeat phrase 0 points 0 points 2 points 0 points  Total Score 0 points 0 points 2 points 2 points    Immunizations Immunization History  Administered Date(s) Administered   Fluad Quad(high Dose 65+) 09/17/2019, 10/30/2020, 09/25/2021, 10/07/2022   HIB (PRP-OMP) 11/02/2021   Influenza Split 10/29/2011, 11/04/2012   Influenza Whole 11/03/2007, 10/09/2009   Influenza, High Dose Seasonal PF 11/23/2015, 09/04/2016, 09/16/2017, 11/23/2018   Influenza,inj,Quad PF,6+ Mos 10/10/2014   Meningococcal B, OMV 03/04/2022, 06/04/2022   Meningococcal Mcv4o 11/02/2021, 03/04/2022   Moderna Sars-Covid-2 Vaccination 01/13/2020, 02/10/2020, 12/01/2020   PNEUMOCOCCAL CONJUGATE-20 09/25/2021   Pneumococcal Conjugate-13 11/23/2015    Pneumococcal Polysaccharide-23 05/10/2010   Tdap 03/07/2014   Zoster, Live 06/07/2014    TDAP status: Up to date  Flu Vaccine status: Due, Education has been provided regarding the importance of this vaccine. Advised may receive this vaccine at local pharmacy or Health Dept. Aware to provide a copy of the vaccination record if obtained from local pharmacy or Health Dept. Verbalized acceptance and understanding.  Pneumococcal vaccine status: Up to date  Covid-19 vaccine status: Information provided on how to obtain vaccines.   Qualifies for Shingles Vaccine? Yes   Zostavax completed Yes   Shingrix Completed?: No.    Education has been provided regarding the importance of this vaccine. Patient has been advised to call insurance company to determine out of pocket expense if they have not yet received this vaccine. Advised may also receive vaccine at local pharmacy or Health Dept. Verbalized acceptance and understanding.  Screening Tests Health Maintenance  Topic Date Due   COVID-19 Vaccine (4 - 2023-24 season) 08/16/2022   OPHTHALMOLOGY EXAM  04/30/2023   Medicare Annual Wellness (AWV)  07/17/2023   INFLUENZA VACCINE  07/17/2023   HEMOGLOBIN A1C  09/12/2023   DTaP/Tdap/Td (2 - Td or Tdap) 03/07/2024   FOOT EXAM  07/02/2024   Pneumonia Vaccine 40+ Years old  Completed   HPV VACCINES  Aged Out   Zoster Vaccines- Shingrix  Discontinued   Meningococcal B Vaccine  Discontinued    Health Maintenance  Health Maintenance Due  Topic Date Due   COVID-19 Vaccine (4 - 2023-24 season) 08/16/2022   OPHTHALMOLOGY EXAM  04/30/2023   Medicare Annual Wellness (AWV)  07/17/2023   INFLUENZA VACCINE  07/17/2023    Colorectal cancer screening: No longer required.   Lung Cancer Screening: (Low Dose CT Chest recommended if Age 22-80 years, 20 pack-year currently smoking OR have quit w/in 15years.) does not qualify.   Additional Screening:  Hepatitis C Screening: does not qualify  Vision  Screening: Recommended annual ophthalmology exams for early detection of glaucoma and other disorders of the eye. Is the patient up to date with their annual eye exam?  No  Who is the provider or what is the name  of the office in which the patient attends annual eye exams? Dr. Patsy [place] If pt is not established with a provider, would they like to be referred to a provider to establish care? No .   Dental Screening: Recommended annual dental exams for proper oral hygiene  Diabetic Foot Exam: Diabetic Foot Exam: Completed 07/03/23  Community Resource Referral / Chronic Care Management: CRR required this visit?  No   CCM required this visit?  No     Plan:     I have personally reviewed and noted the following in the patient's chart:   Medical and social history Use of alcohol, tobacco or illicit drugs  Current medications and supplements including opioid prescriptions. Patient is not currently taking opioid prescriptions. Functional ability and status Nutritional status Physical activity Advanced directives List of other physicians Hospitalizations, surgeries, and ER visits in previous 12 months Vitals Screenings to include cognitive, depression, and falls Referrals and appointments  In addition, I have reviewed and discussed with patient certain preventive protocols, quality metrics, and best practice recommendations. A written personalized care plan for preventive services as well as general preventive health recommendations were provided to patient.     Donne Anon, CMA   07/18/2023   After Visit Summary: (MyChart) Due to this being a telephonic visit, the after visit summary with patients personalized plan was offered to patient via MyChart   Nurse Notes: None

## 2023-07-18 NOTE — Patient Instructions (Signed)
Ethan Mcneil , Thank you for taking time to come for your Medicare Wellness Visit. I appreciate your ongoing commitment to your health goals. Please review the following plan we discussed and let me know if I can assist you in the future.     This is a list of the screening recommended for you and due dates:  Health Maintenance  Topic Date Due   COVID-19 Vaccine (4 - 2023-24 season) 08/16/2022   Eye exam for diabetics  04/30/2023   Flu Shot  07/17/2023   Hemoglobin A1C  09/12/2023   DTaP/Tdap/Td vaccine (2 - Td or Tdap) 03/07/2024   Complete foot exam   07/02/2024   Medicare Annual Wellness Visit  07/17/2024   Pneumonia Vaccine  Completed   HPV Vaccine  Aged Out   Zoster (Shingles) Vaccine  Discontinued   Meningitis B Vaccine  Discontinued    Next appointment: Follow up in one year for your annual wellness visit.   Preventive Care 87 Years and Older, Male Preventive care refers to lifestyle choices and visits with your health care provider that can promote health and wellness. What does preventive care include? A yearly physical exam. This is also called an annual well check. Dental exams once or twice a year. Routine eye exams. Ask your health care provider how often you should have your eyes checked. Personal lifestyle choices, including: Daily care of your teeth and gums. Regular physical activity. Eating a healthy diet. Avoiding tobacco and drug use. Limiting alcohol use. Practicing safe sex. Taking low doses of aspirin every day. Taking vitamin and mineral supplements as recommended by your health care provider. What happens during an annual well check? The services and screenings done by your health care provider during your annual well check will depend on your age, overall health, lifestyle risk factors, and family history of disease. Counseling  Your health care provider may ask you questions about your: Alcohol use. Tobacco use. Drug use. Emotional  well-being. Home and relationship well-being. Sexual activity. Eating habits. History of falls. Memory and ability to understand (cognition). Work and work Astronomer. Screening  You may have the following tests or measurements: Height, weight, and BMI. Blood pressure. Lipid and cholesterol levels. These may be checked every 5 years, or more frequently if you are over 87 years old. Skin check. Lung cancer screening. You may have this screening every year starting at age 87 if you have a 30-pack-year history of smoking and currently smoke or have quit within the past 15 years. Fecal occult blood test (FOBT) of the stool. You may have this test every year starting at age 87. Flexible sigmoidoscopy or colonoscopy. You may have a sigmoidoscopy every 5 years or a colonoscopy every 10 years starting at age 87. Prostate cancer screening. Recommendations will vary depending on your family history and other risks. Hepatitis C blood test. Hepatitis B blood test. Sexually transmitted disease (STD) testing. Diabetes screening. This is done by checking your blood sugar (glucose) after you have not eaten for a while (fasting). You may have this done every 1-3 years. Abdominal aortic aneurysm (AAA) screening. You may need this if you are a current or former smoker. Osteoporosis. You may be screened starting at age 87 if you are at high risk. Talk with your health care provider about your test results, treatment options, and if necessary, the need for more tests. Vaccines  Your health care provider may recommend certain vaccines, such as: Influenza vaccine. This is recommended every year. Tetanus, diphtheria,  and acellular pertussis (Tdap, Td) vaccine. You may need a Td booster every 10 years. Zoster vaccine. You may need this after age 87. Pneumococcal 13-valent conjugate (PCV13) vaccine. One dose is recommended after age 87. Pneumococcal polysaccharide (PPSV23) vaccine. One dose is recommended after  age 87. Talk to your health care provider about which screenings and vaccines you need and how often you need them. This information is not intended to replace advice given to you by your health care provider. Make sure you discuss any questions you have with your health care provider. Document Released: 12/29/2015 Document Revised: 08/21/2016 Document Reviewed: 10/03/2015 Elsevier Interactive Patient Education  2017 [employer].  Fall Prevention in the Home Falls can cause injuries. They can happen to people of all ages. There are many things you can do to make your home safe and to help prevent falls. What can I do on the outside of my home? Regularly fix the edges of walkways and driveways and fix any cracks. Remove anything that might make you trip as you walk through a door, such as a raised step or threshold. Trim any bushes or trees on the path to your home. Use bright outdoor lighting. Clear any walking paths of anything that might make someone trip, such as rocks or tools. Regularly check to see if handrails are loose or broken. Make sure that both sides of any steps have handrails. Any raised decks and porches should have guardrails on the edges. Have any leaves, snow, or ice cleared regularly. Use sand or salt on walking paths during winter. Clean up any spills in your garage right away. This includes oil or grease spills. What can I do in the bathroom? Use night lights. Install grab bars by the toilet and in the tub and shower. Do not use towel bars as grab bars. Use non-skid mats or decals in the tub or shower. If you need to sit down in the shower, use a plastic, non-slip stool. Keep the floor dry. Clean up any water that spills on the floor as soon as it happens. Remove soap buildup in the tub or shower regularly. Attach bath mats securely with double-sided non-slip rug tape. Do not have throw rugs and other things on the floor that can make you trip. What can I do in the  bedroom? Use night lights. Make sure that you have a light by your bed that is easy to reach. Do not use any sheets or blankets that are too big for your bed. They should not hang down onto the floor. Have a firm chair that has side arms. You can use this for support while you get dressed. Do not have throw rugs and other things on the floor that can make you trip. What can I do in the kitchen? Clean up any spills right away. Avoid walking on wet floors. Keep items that you use a lot in easy-to-reach places. If you need to reach something above you, use a strong step stool that has a grab bar. Keep electrical cords out of the way. Do not use floor polish or wax that makes floors slippery. If you must use wax, use non-skid floor wax. Do not have throw rugs and other things on the floor that can make you trip. What can I do with my stairs? Do not leave any items on the stairs. Make sure that there are handrails on both sides of the stairs and use them. Fix handrails that are broken or loose. Make sure  that handrails are as long as the stairways. Check any carpeting to make sure that it is firmly attached to the stairs. Fix any carpet that is loose or worn. Avoid having throw rugs at the top or bottom of the stairs. If you do have throw rugs, attach them to the floor with carpet tape. Make sure that you have a light switch at the top of the stairs and the bottom of the stairs. If you do not have them, ask someone to add them for you. What else can I do to help prevent falls? Wear shoes that: Do not have high heels. Have rubber bottoms. Are comfortable and fit you well. Are closed at the toe. Do not wear sandals. If you use a stepladder: Make sure that it is fully opened. Do not climb a closed stepladder. Make sure that both sides of the stepladder are locked into place. Ask someone to hold it for you, if possible. Clearly mark and make sure that you can see: Any grab bars or  handrails. First and last steps. Where the edge of each step is. Use tools that help you move around (mobility aids) if they are needed. These include: Canes. Walkers. Scooters. Crutches. Turn on the lights when you go into a dark area. Replace any light bulbs as soon as they burn out. Set up your furniture so you have a clear path. Avoid moving your furniture around. If any of your floors are uneven, fix them. If there are any pets around you, be aware of where they are. Review your medicines with your doctor. Some medicines can make you feel dizzy. This can increase your chance of falling. Ask your doctor what other things that you can do to help prevent falls. This information is not intended to replace advice given to you by your health care provider. Make sure you discuss any questions you have with your health care provider. Document Released: 09/28/2009 Document Revised: 05/09/2016 Document Reviewed: 01/06/2015 Elsevier Interactive Patient Education  2017 [employer].

## 2023-07-20 NOTE — Progress Notes (Unsigned)
Cardiology Office Note:   Date:  07/21/2023  ID:  Ethan Mcneil, DOB [birth year], MRN [id number] PCP: Wanda Plump, MD  [facility] Providers Cardiologist:  Rollene Rotunda, MD {  History of Present Illness:   Ethan Mcneil is a 89 y.o. male with a hx of CAD s/p CABG.  The patient was seen previously in the emergency room for shortness of breath in December 2022.  BNP at the time was 1900.  Creatinine  was 2.5.  He received 40 mg IV Lasix which made him feel better.  He was seen by Dr. Anne Fu on 12/28/2021, he was felt to be massively volume overloaded at the time.  Patient was directly admitted to the hospital.  Echocardiogram obtained on 12/29/2021 showed EF 55%, mild LVH, grade 2 DD, RVSP 44.2 mmHg, mild to moderate MR, moderate to severe TR. he was admitted for acute on chronic heart failure and community-acquired pneumonia in April 2023.  Metoprolol was decreased due to slow heart rate.  He was discharged home on torsemide.     He was seen in June 2023, he was found to be volume overloaded, he was encouraged to take additional dose of torsemide for 3 days before going back to the previous dose.  He went to [facility] ED in March 2024 with chest pain and progressive dyspnea and orthopnea.  He was found to be volume overloaded requiring BiPAP therapy.  He was subsequently transferred to [facility] and treated for acute hypoxic respiratory failure and acute on chronic diastolic heart failure.  BNP was elevated at 2231.  Serial troponin was 163-->906.  Echocardiogram obtained on 03/17/2023 demonstrated EF 35%.  With IV diuresis, creatinine trended up to 3.87 before trending back down to 3.22 on discharge.  Chest pain and elevated troponin was managed medically given advanced stage IV CKD and a high risk for contrast nephropathy.  He had anemia with baseline hemoglobin 9--10 during hospitalization.  Hospital course complicated by UTI treated with ceftriaxone and discharged on renally dosed cefdinir for 6 days.   The patient returned to the hospital on 04/12/2023, BNP was greater than 30,000.  Chest x-ray showed bilateral pleural effusion.  He underwent aggressive IV diuresis.  Echocardiogram showed EF has reduced further down to 25%, moderate MR, moderate TR, moderately reduced RVEF.  Due to intermittent chest pain, patient underwent cardiac catheterization on 04/16/2023 and had successful PCI of vein graft on 5/2.  He was discharged on 60 mg daily of torsemide.  His creatinine on discharge was 3.27.    He was seen by Dr. Jacquelyne Balint of [facility] cardiology service on 05/16/2023 at which time he was doing well.  His 60 mg daily of torsemide was reportedly reduced down to 40 mg daily at that time.  He has not seen any significant weight gain.    Since he was last seen in this office he was again that she had a hospital with acute shortness of breath and some chest discomfort and he had to be sent to [facility].  I reviewed these records.  He had occluded LAD circumflex and RCA.  Vein graft to the RCA was patent.  Vein graft to the first diagonal artery had 50% occlusion in the midsegment.  Sequential vein graft to the ramus and into the first marginal and then the second marginal had 90% occlusion in the midsegment proximal to the ramus anastomosis.  In he had this area stented.  His ejection fraction was 25 to 30%.  Since going  home he does occasionally have taken nitroglycerin.  He is probably had 3-4 in the last 2 or 3 months.  He thinks this is a stable pattern.  He starts to get some discomfort that is mild 4 out of 10.  He might get a little short of breath.  He will take his nitroglycerin and he says in 10 to 15 minutes his symptoms have resolved.  He is watching his weight and taking as needed 20 mg of torsemide in addition to his scheduled torsemide 20 mg if his weight goes up very short of breath.  He is watching salt and fluid.  He gets around with a cane.    ROS: As stated in the HPI and negative for all other  systems.  Studies Reviewed:    EKG:     NA   Risk Assessment/Calculations:              Physical Exam:   VS:  BP (!) 138/52 (BP Location: Left Arm, Patient Position: Sitting, Cuff Size: Normal)   Pulse 63   Ht 5\' 10"  (1.778 m)   Wt 140 lb 9.6 oz (63.8 kg)   SpO2 94%   BMI 20.17 kg/m    Wt Readings from Last 3 Encounters:  07/21/23 140 lb 9.6 oz (63.8 kg)  07/18/23 135 lb (61.2 kg)  07/03/23 140 lb 9.6 oz (63.8 kg)     GEN:    Slightly frail, well developed in no acute distress NECK: No JVD; No carotid bruits CARDIAC: RRR, no murmurs, rubs, gallops RESPIRATORY:  Clear to auscultation without rales, wheezing or rhonchi  ABDOMEN: Soft, non-tender, non-distended EXTREMITIES:  Moderate bilateral leg edema; No deformity   ASSESSMENT AND PLAN:   CAD s/p CABG:   The patient has no new sypmtoms.  No further cardiovascular testing is indicated.  We will continue with aggressive risk reduction and meds as listed.  I reviewed the [facility] notes.    CKD stage IV: He is followed by nephrology.     Hypertension:   This is being managed in the context of treating his CHF   Hyperlipidemia: He is on Lipitor with an LDL of 45.  No change in therapy.   Chronic systolic heart failure:   He is on OMT limited by his renal function.  I am going to avoid [place] with his severe renal insufficiency.     Follow up with Azalee Course PA in 3 months.   Signed, Rollene Rotunda, MD

## 2023-07-21 ENCOUNTER — Encounter: Payer: Self-pay | Admitting: Cardiology

## 2023-07-21 ENCOUNTER — Ambulatory Visit: Payer: Medicare HMO | Attending: Cardiology | Admitting: Cardiology

## 2023-07-21 VITALS — BP 138/52 | HR 63 | Ht 70.0 in | Wt 140.6 lb

## 2023-07-21 DIAGNOSIS — E785 Hyperlipidemia, unspecified: Secondary | ICD-10-CM

## 2023-07-21 DIAGNOSIS — I2510 Atherosclerotic heart disease of native coronary artery without angina pectoris: Secondary | ICD-10-CM | POA: Diagnosis not present

## 2023-07-21 DIAGNOSIS — I5022 Chronic systolic (congestive) heart failure: Secondary | ICD-10-CM

## 2023-07-21 DIAGNOSIS — N184 Chronic kidney disease, stage 4 (severe): Secondary | ICD-10-CM | POA: Diagnosis not present

## 2023-07-21 MED ORDER — TORSEMIDE 20 MG PO TABS: ORAL_TABLET | ORAL | Status: DC

## 2023-07-21 NOTE — Patient Instructions (Signed)
Medication Instructions:  Your physician recommends that you continue on your current medications as directed. Please refer to the Current Medication list given to you today.  *If you need a refill on your cardiac medications before your next appointment, please call your pharmacy*    Follow-Up: At [facility], you and your health needs are our priority.  As part of our continuing mission to provide you with exceptional heart care, we have created designated Provider Care Teams.  These Care Teams include your primary Cardiologist (physician) and Advanced Practice Providers (APPs -  Physician Assistants and Nurse Practitioners) who all work together to provide you with the care you need, when you need it.  We recommend signing up for the patient portal called "MyChart".  Sign up information is provided on this After Visit Summary.  MyChart is used to connect with patients for Virtual Visits (Telemedicine).  Patients are able to view lab/test results, encounter notes, upcoming appointments, etc.  Non-urgent messages can be sent to your provider as well.   To learn more about what you can do with MyChart, go to [email or web address].    Your next appointment:   3 month(s)  Provider:   Azalee Course, PA-C

## 2023-07-22 ENCOUNTER — Other Ambulatory Visit: Payer: Self-pay | Admitting: Internal Medicine

## 2023-07-24 ENCOUNTER — Ambulatory Visit: Payer: Self-pay

## 2023-07-24 ENCOUNTER — Other Ambulatory Visit: Payer: Self-pay | Admitting: Internal Medicine

## 2023-07-24 NOTE — Patient Instructions (Signed)
Visit Information  Thank you for taking time to visit with me today. Please don't hesitate to contact me if I can be of assistance to you.   Following are the goals we discussed today:  Continue to attend provider visits as scheduled Continue to take medications as prescribed Contact provider with health questions or concerns as needed   If you are experiencing a Mental Health or Behavioral Health Crisis or need someone to talk to, please call the Suicide and Crisis Lifeline: 988 call the [place] National Suicide Prevention Lifeline: 586 670 0398 or TTY: (530) 175-8949 TTY (231)236-0911) to talk to a trained counselor call 1-800-273-TALK (toll free, 24 hour hotline)  Kathyrn Sheriff, RN, MSN, BSN, CCM [facility] Care Coordinator [phone]

## 2023-07-24 NOTE — Patient Outreach (Signed)
  Care Coordination   Follow Up Visit Note   07/24/2023 Name: Ethan Mcneil MRN: [id number] DOB: [birth year]  Ethan Mcneil is a 89 y.o. year old male who sees Drue Novel, Nolon Rod, MD for primary care. I spoke with  Ethan Mcneil by phone today.  What matters to the patients health and wellness today?  Mr. Aydelott reports he is doing well. Cardiology visit completed on 07/21/23. He reports he is taking his medications ads prescribed. Reports his weight has been stable. Denies any signs/symptoms of HF exacerbation. He reports last blood pressure check was 135/65 and blood sugar 130 ac today. He denies any low blood sugar readings. Mr. Rasmuson reports he has an appointment with nephrologist next week. He denies any additional care coordination, disease education or resource needs at this time. Patient to contact RNCM or Primary care if care coordination needs in the future.   Goals Addressed             This Visit's Progress    COMPLETED: Continue to improve post hospitalization       Interventions Today    Flowsheet Row Most Recent Value  Chronic Disease   Chronic disease during today's visit Congestive Heart Failure (CHF), Other, Hypertension (HTN), Chronic Kidney Disease/End Stage Renal Disease (ESRD), Diabetes  [cardiac-6/24-6/28 NSTEMI]  General Interventions   General Interventions Discussed/Reviewed General Interventions Reviewed  [reiterated Care coordination program. encouraged patient to call RNCM if care coordinationneeds in the future. confirmed patient has RNCM contact number.]  Education Interventions   Education Provided Provided Education  [reviewed patient instructions per endocrinolgy visit on 07/03/23 and cardiology visit on 07/21/23]  Provided Verbal Education On Other, Medication, Blood Sugar Monitoring, When to see the doctor  Pearletha Furl if any medication needs-advised to continue to take medications as prescribed. encouraged to continue attend provider visits as scheduled, advised  to contact provider for any health questions or concerns]  Pharmacy Interventions   Pharmacy Dicussed/Reviewed Pharmacy Topics Reviewed            SDOH assessments and interventions completed:  No  Care Coordination Interventions:  Yes, provided   Follow up plan: No further intervention required.   Encounter Outcome:  Pt. Visit Completed   Kathyrn Sheriff, RN, MSN, BSN, CCM [facility] Care Coordinator [phone]

## 2023-07-28 ENCOUNTER — Telehealth: Payer: Self-pay

## 2023-07-28 NOTE — Telephone Encounter (Signed)
Physician orders (order number: [id number]) signed and faxed back to [facility] at [phone]. Form sent for scanning.

## 2023-07-29 DIAGNOSIS — N184 Chronic kidney disease, stage 4 (severe): Secondary | ICD-10-CM | POA: Diagnosis not present

## 2023-07-29 DIAGNOSIS — D631 Anemia in chronic kidney disease: Secondary | ICD-10-CM | POA: Diagnosis not present

## 2023-07-30 DIAGNOSIS — I129 Hypertensive chronic kidney disease with stage 1 through stage 4 chronic kidney disease, or unspecified chronic kidney disease: Secondary | ICD-10-CM | POA: Diagnosis not present

## 2023-07-30 DIAGNOSIS — N2581 Secondary hyperparathyroidism of renal origin: Secondary | ICD-10-CM | POA: Diagnosis not present

## 2023-07-30 DIAGNOSIS — D631 Anemia in chronic kidney disease: Secondary | ICD-10-CM | POA: Diagnosis not present

## 2023-07-30 DIAGNOSIS — E872 Acidosis, unspecified: Secondary | ICD-10-CM | POA: Diagnosis not present

## 2023-07-30 DIAGNOSIS — E559 Vitamin D deficiency, unspecified: Secondary | ICD-10-CM | POA: Diagnosis not present

## 2023-07-30 DIAGNOSIS — N184 Chronic kidney disease, stage 4 (severe): Secondary | ICD-10-CM | POA: Diagnosis not present

## 2023-07-30 DIAGNOSIS — R600 Localized edema: Secondary | ICD-10-CM | POA: Diagnosis not present

## 2023-07-30 LAB — BASIC METABOLIC PANEL
BUN: 49 — AB (ref 4–21)
CO2: 24 — AB (ref 13–22)
Chloride: 103 (ref 99–108)
Creatinine: 3.3 — AB (ref 0.6–1.3)
Glucose: 183
Potassium: 4.5 mEq/L (ref 3.5–5.1)
Sodium: 137 (ref 137–147)

## 2023-07-30 LAB — COMPREHENSIVE METABOLIC PANEL
Albumin: 4.3 (ref 3.5–5.0)
Calcium: 8.7 (ref 8.7–10.7)
eGFR: 18

## 2023-07-30 LAB — VITAMIN D 25 HYDROXY (VIT D DEFICIENCY, FRACTURES): Vit D, 25-Hydroxy: 38.2

## 2023-08-05 ENCOUNTER — Encounter: Payer: Self-pay | Admitting: Internal Medicine

## 2023-08-07 ENCOUNTER — Encounter: Payer: Self-pay | Admitting: Internal Medicine

## 2023-08-07 DIAGNOSIS — H903 Sensorineural hearing loss, bilateral: Secondary | ICD-10-CM | POA: Diagnosis not present

## 2023-08-12 DIAGNOSIS — D631 Anemia in chronic kidney disease: Secondary | ICD-10-CM | POA: Diagnosis not present

## 2023-08-12 DIAGNOSIS — N184 Chronic kidney disease, stage 4 (severe): Secondary | ICD-10-CM | POA: Diagnosis not present

## 2023-08-15 DIAGNOSIS — H903 Sensorineural hearing loss, bilateral: Secondary | ICD-10-CM | POA: Diagnosis not present

## 2023-08-20 ENCOUNTER — Other Ambulatory Visit: Payer: Self-pay | Admitting: Family Medicine

## 2023-08-20 ENCOUNTER — Other Ambulatory Visit: Payer: Self-pay | Admitting: Internal Medicine

## 2023-08-26 DIAGNOSIS — D631 Anemia in chronic kidney disease: Secondary | ICD-10-CM | POA: Diagnosis not present

## 2023-08-26 DIAGNOSIS — N184 Chronic kidney disease, stage 4 (severe): Secondary | ICD-10-CM | POA: Diagnosis not present

## 2023-09-02 ENCOUNTER — Other Ambulatory Visit: Payer: Self-pay | Admitting: Internal Medicine

## 2023-09-03 DIAGNOSIS — H524 Presbyopia: Secondary | ICD-10-CM | POA: Diagnosis not present

## 2023-09-03 LAB — HM DIABETES EYE EXAM

## 2023-09-04 ENCOUNTER — Encounter: Payer: Self-pay | Admitting: Internal Medicine

## 2023-09-04 DIAGNOSIS — Z0100 Encounter for examination of eyes and vision without abnormal findings: Secondary | ICD-10-CM | POA: Diagnosis not present

## 2023-09-26 ENCOUNTER — Ambulatory Visit: Payer: Medicare HMO | Admitting: Internal Medicine

## 2023-09-29 ENCOUNTER — Other Ambulatory Visit: Payer: Self-pay | Admitting: Physician Assistant

## 2023-10-01 ENCOUNTER — Ambulatory Visit: Payer: Medicare HMO | Admitting: Internal Medicine

## 2023-10-01 ENCOUNTER — Telehealth: Payer: Self-pay | Admitting: *Deleted

## 2023-10-01 ENCOUNTER — Encounter: Payer: Self-pay | Admitting: Internal Medicine

## 2023-10-01 VITALS — BP 126/66 | HR 59 | Temp 97.7°F | Resp 16 | Ht 70.0 in | Wt 146.2 lb

## 2023-10-01 DIAGNOSIS — E038 Other specified hypothyroidism: Secondary | ICD-10-CM

## 2023-10-01 DIAGNOSIS — E785 Hyperlipidemia, unspecified: Secondary | ICD-10-CM | POA: Diagnosis not present

## 2023-10-01 DIAGNOSIS — N184 Chronic kidney disease, stage 4 (severe): Secondary | ICD-10-CM

## 2023-10-01 DIAGNOSIS — Z23 Encounter for immunization: Secondary | ICD-10-CM | POA: Diagnosis not present

## 2023-10-01 DIAGNOSIS — Q8901 Asplenia (congenital): Secondary | ICD-10-CM

## 2023-10-01 LAB — LIPID PANEL
Cholesterol: 96 mg/dL (ref 0–200)
HDL: 56.8 mg/dL (ref >39.00)
LDL Cholesterol: 32 mg/dL (ref 0–99)
NonHDL: 39.46
Total CHOL/HDL Ratio: 2
Triglycerides: 38 mg/dL (ref 0.0–149.0)
VLDL: 7.6 mg/dL (ref 0.0–40.0)

## 2023-10-01 LAB — TSH: TSH: 0.6 u[IU]/mL (ref 0.35–5.50)

## 2023-10-01 NOTE — Patient Instructions (Addendum)
Vaccines I recommend:  Tdap (tetanus) RSV vaccine Covid booster   Check the  blood pressure regularly Blood pressure goal:  between 110/65 and  135/85. If it is consistently higher or lower, let me know     GO TO THE LAB : Get the blood work     Next visit with me in 4 months, physical exam.  Please schedule it at the front desk

## 2023-10-01 NOTE — Assessment & Plan Note (Signed)
DM: Per Endo. HTN: BP seems well-controlled with current meds. CAD: Saw cardiology 07/21/2023, Rx aggressive risk reduction.  Check FLP.   Hypothyroidism: Check a TSH.   CKD Saw nephrology 04/2023, CKD felt to be related to DM, HTN and CHF.  Urinalysis was bland.  Dialysis was discussed, was rec to stay off ACE or ARB's.  A1c goal less than 7.  Creatinine on 04/29/2023: 3.1. Saw nephrology again 07-2023, no note, creatinine was 3.3 per [facility] Anemia: Per renal note May 2024, he is on ESA and iron injections.   Asplenia: Guidelines reviewed, meningococcal B booster today. Vaccine advice: Recommend COVID booster in few days. RTC CPX 4 months

## 2023-10-01 NOTE — Progress Notes (Signed)
Subjective:    Patient ID: Ethan Mcneil, male    DOB: 02/11/1934, 89 y.o.   MRN: [id number]  DOS:  10/01/2023 Type of visit - description: f/u  Since the last office visit, saw cardiology and nephrology, notes reviewed. O2 sat   90%, recheck 94%. Admits to some DOE. Denies cough, wheezing.  No chest pain.  Lower extremity edema at baseline. No palpitations. No nausea vomiting diarrhea or blood in the stools.   Review of Systems See above   Past Medical History:  Diagnosis Date   Anemia    Arthritis    CAD (coronary artery disease)    CABG 1997; grafts patent @ cath [facility] 11/09   DM2 (diabetes mellitus, type 2) (HCC)    Gout    after tick bite   History of kidney stones    HLD (hyperlipidemia)    HTN (hypertension)    essential nos   Hypothyroid    Pancreatic lesion    Postsurgical aortocoronary bypass status    PVD (peripheral vascular disease) (HCC)    S/P herniorrhaphy    Tick fever 2012   [facility] [place]   Unspecified hyperplasia of prostate without urinary obstruction and other lower urinary tract symptoms (LUTS)    Urolithiasis 2015   hx of   Wears dentures    Wears glasses     Past Surgical History:  Procedure Laterality Date   BIOPSY  08/07/2020   Procedure: BIOPSY;  Surgeon: Lemar Lofty., MD;  Location: [facility] ENDOSCOPY;  Service: Gastroenterology;;   CATARACT EXTRACTION Bilateral    CORONARY ARTERY BYPASS GRAFT     x7 vessels 1997; cath 2002 and 2009; grafts patent    CYSTOSCOPY WITH RETROGRADE PYELOGRAM, URETEROSCOPY AND STENT PLACEMENT Right 02/21/2014   Procedure: CYSTOSCOPY WITH RIGHT  RETROGRADE PYELOGRAM,RIGHT  URETEROSCOPY WITH STONE BASKETING EXTRACTION;  Surgeon: Bjorn Pippin, MD;  Location: [facility] ORS;  Service: Urology;  Laterality: Right;   ESOPHAGOGASTRODUODENOSCOPY (EGD) WITH PROPOFOL N/A 08/07/2020   Procedure: ESOPHAGOGASTRODUODENOSCOPY (EGD) WITH PROPOFOL;  Surgeon: Meridee Score Netty Starring., MD;  Location: [facility]  ENDOSCOPY;  Service: Gastroenterology;  Laterality: N/A;   EUS N/A 08/07/2020   Procedure: UPPER ENDOSCOPIC ULTRASOUND (EUS) LINEAR;  Surgeon: Lemar Lofty., MD;  Location: [facility] ENDOSCOPY;  Service: Gastroenterology;  Laterality: N/A;   FINE NEEDLE ASPIRATION N/A 08/07/2020   Procedure: FINE NEEDLE ASPIRATION (FNA) LINEAR;  Surgeon: Lemar Lofty., MD;  Location: [facility] ENDOSCOPY;  Service: Gastroenterology;  Laterality: N/A;   HERNIA REPAIR     INGUINAL HERNIA REPAIR     LEFT HEART CATH  04/16/2023   LEFT HEART CATH  04/17/2023   MULTIPLE TOOTH EXTRACTIONS     SHOULDER SURGERY     SPLENECTOMY, TOTAL N/A 10/26/2020   Procedure: SPLENECTOMY;  Surgeon: Almond Lint, MD;  Location: [facility] OR;  Service: General;  Laterality: N/A;    Current Outpatient Medications  Medication Instructions   acetaminophen (TYLENOL) 500 mg, Oral, Every 4 hours PRN   amLODipine (NORVASC) 10 mg, Oral, Daily   aspirin EC 81 mg, Oral, Daily at bedtime   atorvastatin (LIPITOR) 80 mg, Oral, Daily   B-12 1,000 mcg, Oral, Daily   B-D UF III MINI PEN NEEDLES 31G X 5 MM MISC USE 3 TIMES A DAY AS DIRECTED   Basaglar KwikPen 8 Units, Subcutaneous, Daily at bedtime   clopidogrel (PLAVIX) 75 mg, Oral, Daily   Continuous Blood Gluc Receiver (FREESTYLE LIBRE 2 READER) DEVI Use as instructed to check blood sugar  Continuous Blood Gluc Sensor (FREESTYLE LIBRE 2 SENSOR) MISC 1 each, Does not apply, Every 14 days   FREESTYLE LITE test strip CHECK BLOOD SUGAR NO MORE THAN TWICE DAILY.   hydrALAZINE (APRESOLINE) 100 mg, Oral, 3 times daily   insulin aspart (FIASP FLEXTOUCH) 100 UNIT/ML FlexTouch Pen INJECT 6-8 UNITS INTO THE SKIN WITH BREAKFAST, WITH LUNCH, AND WITH EVENING MEAL.   isosorbide mononitrate (IMDUR) 120 mg, Oral, Daily   levothyroxine (SYNTHROID) 150 mcg, Oral, Daily before breakfast   metoprolol succinate (TOPROL-XL) 25 mg, Oral, Daily   nitroGLYCERIN (NITROSTAT) 0.4 MG SL tablet PLACE 1 TABLET UNDER THE  TONGUE EVERY 5 MINUTES X 3 DOSES AS NEEDED FOR CHEST PAIN.   Pancrelipase, Lip-Prot-Amyl, (ZENPEP) 25000-79000 units CPEP Take 2 capsules by mouth with every meal and snacks   pantoprazole (PROTONIX) 40 mg, Oral, Daily   sodium bicarbonate 650 mg, Oral, 2 times daily   torsemide (DEMADEX) 60 mg, Oral, Daily   Vitamin D3 2,000 Units, Oral, Daily       Objective:   Physical Exam BP 126/66   Pulse (!) 59   Temp 97.7 F (36.5 C) (Oral)   Resp 16   Ht 5\' 10"  (1.778 m)   Wt 146 lb 4 oz (66.3 kg)   SpO2 90%   BMI 20.98 kg/m  General:   Well developed, NAD, BMI noted. HEENT:  Normocephalic . Face symmetric, atraumatic Lungs:  CTA B Normal respiratory effort, no intercostal retractions, no accessory muscle use. Heart: RRR, coarse of diastolic murmur.  Lower extremities: Mild left edema skin: Not pale. Not jaundice Neurologic:  alert & oriented X3.  Speech normal, gait appropriate for age, uses a cane Psych--  Cognition and judgment appear intact.  Cooperative with normal attention span and concentration.  Behavior appropriate. No anxious or depressed appearing.      Assessment     Assessment DM -- w/ CAD, no neuropathy, intolerant to metformin , sees ENDO HTN Hyperkalemia-- off  ACEs Hyperlipidemia Hypothyroidism DJD BPH, LUTS,h/o urolithiasis -- sees urology CKD: started to see renal 06/2020 Anemia  CV: --CAD, CABG 1997, cardiac cath 2009 --PVD (no ABIs in the chart) --L leg edema, chronic, (-) DVT per [place] 2015 -- CHF -- NSTEMI and PCI 05/2023 Gout GI: Pancreatic insufficiency dx 02-2017 B 12 deficiency, mild 2015  Wt loss:  2012 187 lb, 2014 173 lb , 2015 167 01-2017: EGD normal, BX chronic active gastritis. Colonoscopy normal. Saw GI 02-2017: likely d/t Pancreatic insufficiency and poorly controlled diabetes. 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  PNM 20: 09-2021 -Meningococcal: #1  11-02-21, #2 02/2022. Boost q 5 years -meningococcal B: 02/2022 and 06-04-2022.  Guidelines: booster 1 year after primary series then every 2 to 3 years.  Boost today.  - haemophilus influenza:   11-02-21. Completed   PLAN:  DM: Per Endo. HTN: BP seems well-controlled with current meds. CAD: Saw cardiology 07/21/2023, Rx aggressive risk reduction.  Check FLP.   Hypothyroidism: Check a TSH.   CKD Saw nephrology 04/2023, CKD felt to be related to DM, HTN and CHF.  Urinalysis was bland.  Dialysis was discussed, was rec to stay off ACE or ARB's.  A1c goal less than 7.  Creatinine on 04/29/2023: 3.1. Saw nephrology again 07-2023, no note, creatinine was 3.3 per [facility]  Anemia: Per renal note May 2024, he is on ESA and iron injections.   Asplenia: Guidelines reviewed, meningococcal B booster today. Vaccine advice: Recommend COVID booster in few days. RTC CPX 4 months

## 2023-10-01 NOTE — Telephone Encounter (Signed)
Noted! Thank you

## 2023-10-01 NOTE — Telephone Encounter (Signed)
FYI:  Pt came to lab this morning after OV with PCP. Pt stated his blood sugar was low and showed me his CGM reading of 53 @ 9:25am.  Fingerstick confirmed reading. Gave pt 2 glucose tablets and 2 graham crackers, rechecked glucose @ 9:40am and reading was 67. Rechecked glucose at 9:50 and reading was 82. Pt stated he felt fine at that time. He also stated he had taken his medications that morning but fasted for his appointment and felt that was why his glucose had dropped.

## 2023-10-21 ENCOUNTER — Ambulatory Visit: Payer: Medicare HMO | Admitting: Physician Assistant

## 2023-10-21 NOTE — Progress Notes (Signed)
This encounter was created in error - please disregard.

## 2023-10-27 ENCOUNTER — Encounter: Payer: Self-pay | Admitting: Physician Assistant

## 2023-10-27 ENCOUNTER — Ambulatory Visit: Payer: Medicare HMO | Attending: Physician Assistant | Admitting: Physician Assistant

## 2023-10-27 VITALS — BP 120/52 | HR 63 | Ht 70.0 in | Wt 156.0 lb

## 2023-10-27 DIAGNOSIS — N184 Chronic kidney disease, stage 4 (severe): Secondary | ICD-10-CM | POA: Diagnosis not present

## 2023-10-27 DIAGNOSIS — I25810 Atherosclerosis of coronary artery bypass graft(s) without angina pectoris: Secondary | ICD-10-CM | POA: Diagnosis not present

## 2023-10-27 DIAGNOSIS — I5032 Chronic diastolic (congestive) heart failure: Secondary | ICD-10-CM

## 2023-10-27 DIAGNOSIS — E785 Hyperlipidemia, unspecified: Secondary | ICD-10-CM | POA: Diagnosis not present

## 2023-10-27 DIAGNOSIS — I10 Essential (primary) hypertension: Secondary | ICD-10-CM

## 2023-10-27 NOTE — Addendum Note (Signed)
Addended byLisabeth Devoid, Shah Insley on: 10/27/2023 08:57 AM   Modules accepted: Level of Service

## 2023-10-27 NOTE — Patient Instructions (Signed)
Medication Instructions:  NO CHANGES *If you need a refill on your cardiac medications before your next appointment, please call your pharmacy*   Lab Work: NO LABS If you have labs (blood work) drawn today and your tests are completely normal, you will receive your results only by: MyChart Message (if you have MyChart) OR A paper copy in the mail If you have any lab test that is abnormal or we need to change your treatment, we will call you to review the results.   Testing/Procedures: NO TESTING   Follow-Up: At [facility], you and your health needs are our priority.  As part of our continuing mission to provide you with exceptional heart care, we have created designated Provider Care Teams.  These Care Teams include your primary Cardiologist (physician) and Advanced Practice Providers (APPs -  Physician Assistants and Nurse Practitioners) who all work together to provide you with the care you need, when you need it.    Your next appointment:   3-4 month(s)  Provider:   Rollene Rotunda, MD

## 2023-10-27 NOTE — Progress Notes (Signed)
Cardiology Office Note:  .   Date:  10/27/2023  ID:  [name], DOB [birth year], MRN [id number] PCP: Wanda Plump, MD  [facility] Providers Cardiologist:  Rollene Rotunda, MD     History of Present Illness: .   Ethan Mcneil is a 89 y.o. 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  Last cardiac catheterization in 2009 revealed patent grafts.   Patient was seen previously in the emergency room for shortness of breath in December 2022.  BNP at the time was 1900.  Creatinine 2.5.  He received 40 mg IV Lasix which made him feel better.  He was seen by Dr. Anne Fu on 12/28/2021, he was felt to be massively volume overloaded at the time.  Patient was directly admitted to the hospital.  Echocardiogram obtained on 12/29/2021 showed EF 55%, mild LVH, grade 2 DD, RVSP 44.2 mmHg, mild to moderate MR, moderate to severe TR. he was admitted for acute on chronic heart failure and community-acquired pneumonia in April 2023.  Metoprolol was decreased due to slow heart rate.  He was discharged home on torsemide.    He was seen by Dr. Antoine Poche in June 2023, he was found to be volume overloaded, he was encouraged to take additional dose of torsemide for 3 days before going back to the previous dose.  He went to [facility] ED in March 2024 with chest pain and progressive dyspnea and orthopnea.  He was found to be volume overloaded requiring BiPAP therapy.  He was subsequently transferred to [facility] and treated for acute hypoxic respiratory failure and acute on chronic diastolic heart failure.  BNP was elevated at 2231.  Serial troponin was 163-->906.  Echocardiogram obtained on 03/17/2023 demonstrated EF 35%.  With IV diuresis, creatinine trended up to 3.87 before trending back down to 3.22 on discharge.  Chest pain and elevated troponin was managed medically given advanced stage IV CKD and a high risk for contrast  nephropathy.  He had anemia with baseline hemoglobin 9--10 during hospitalization.  Hospital course complicated by UTI treated with ceftriaxone and discharged on renally dosed cefdinir for 6 days. Patient returned to the hospital on 04/12/2023, BNP was greater than 30,000.  Chest x-ray showed bilateral pleural effusion.  He underwent aggressive IV diuresis.  Echocardiogram showed EF has reduced further down to 25%, moderate MR, moderate TR, moderately reduced RVEF.  Due to intermittent chest pain, patient underwent cardiac catheterization on 04/19/2023, he was found to have patent LIMA to LAD, patent SVG to RCA, patent SVG to D2, 95 to 99% stenosis in SVG jump graft to OM 2.  He returned to the Cath Lab and underwent successful PCI of SVG to OM with 3 drug-eluting stents. He was discharged on 60 mg daily of torsemide.  His creatinine on discharge was 3.27.   I last saw the patient in June 2024, unfortunately since then, he was readmitted to [facility] system on 06/09/2023 with another NSTEMI.  He underwent another cardiac catheterization on 06/09/2023 which showed totally occluded left main, LAD and left circumflex artery, patent vein graft to the RCA, 50% disease he had SVG to first diagonal, sequential vein graft to the ramus then OM1 then OM 2 had a 90% occlusion in the mid segment proximal to the ramus anastomosis.  This was treated with 3.0 x 12 mm DES.Echocardiogram showed EF 25 to 30%, global hypokinesis, most severely in the inferior  and inferolateral wall, no significant pericardial effusion.  Patient presents today for follow-up.  He has been doing well since his last visit with Dr. Antoine Poche, he still occasionally has some mild chest discomfort however this is significantly improved, prior to prior to the stent.  Last blood work obtained in August showed a creatinine of 3.3 which is near his baseline.  Cholesterol blood work obtained in October was very well-controlled.  He has chronic left lower extremity  swelling after vein harvesting procedure from previous bypass surgery, right lower extremity is normal.  Overall, patient is stable from the cardiac perspective and can follow-up in 3 to 4 months with Dr. Antoine Poche.   ROS:   Patient has mild occasional chest discomfort, he has shortness of breath with more strenuous activity however able to do everyday activity at home without issue.  He denies any orthopnea or PND.  Studies Reviewed: .        Cardiac Studies & Procedures       ECHOCARDIOGRAM  ECHOCARDIOGRAM COMPLETE 12/29/2021  Narrative ECHOCARDIOGRAM REPORT    Patient Name:   Ethan Mcneil Date of Exam: 12/29/2021 Medical Rec #:  [id number]       Height:       68.0 in Accession #:    [id number]      Weight:       193.6 lb Date of Birth:  1934-09-26       BSA:          2.016 m Patient Age:    87 years        BP:           168/57 mmHg Patient Gender: M               HR:           59 bpm. Exam Location:  Inpatient  Procedure: 2D Echo, Color Doppler and Cardiac Doppler  Indications:    I50.21 Acute systolic (congestive) heart failure  History:        Patient has prior history of Echocardiogram examinations, most recent 05/27/2021. CAD; Risk Factors:Hypertension, Diabetes and Dyslipidemia. Prior performed at [facility].  Sonographer:    Irving Burton Senior RDCS Referring Phys: [id number] CALLIE E GOODRICH  IMPRESSIONS   1. Left ventricular ejection fraction, by estimation, is 55%. The left ventricle has normal function. The left ventricle has no regional wall motion abnormalities. There is mild concentric left ventricular hypertrophy. Left ventricular diastolic parameters are consistent with Grade II diastolic dysfunction (pseudonormalization). 2. Right ventricular systolic function is normal. The right ventricular size is normal. There is mildly elevated pulmonary artery systolic pressure. The estimated right ventricular systolic pressure is 44.2 mmHg. 3. Left atrial size was mildly  dilated. 4. The mitral valve is abnormal. Mild to moderate mitral valve regurgitation. There is pulmonary vein blunting without reversal. Study may underestimate regurgitation severity (best seen long axis view). No evidence of mitral stenosis. 5. Tricuspid valve regurgitation is moderate to severe. 6. The aortic valve is tricuspid. Aortic valve regurgitation is not visualized. 7. Pulmonic valve regurgitation is moderate. 8. Mildly dilated pulmonary artery. 9. The inferior vena cava is normal in size with greater than 50% respiratory variability, suggesting right atrial pressure of 3 mmHg.  Comparison(s): Compared to outside report, tricuspid and mitral valve regurgitation are worse.  FINDINGS Left Ventricle: Left ventricular ejection fraction, by estimation, is 55%. The left ventricle has normal function. The left ventricle has no regional wall motion abnormalities. The left ventricular internal cavity size  was normal in size. There is mild concentric left ventricular hypertrophy. Left ventricular diastolic parameters are consistent with Grade II diastolic dysfunction (pseudonormalization).  Right Ventricle: The right ventricular size is normal. No increase in right ventricular wall thickness. Right ventricular systolic function is normal. There is mildly elevated pulmonary artery systolic pressure. The tricuspid regurgitant velocity is 3.21 m/s, and with an assumed right atrial pressure of 3 mmHg, the estimated right ventricular systolic pressure is 44.2 mmHg.  Left Atrium: Left atrial size was mildly dilated.  Right Atrium: Right atrial size was normal in size.  Pericardium: There is no evidence of pericardial effusion.  Mitral Valve: The mitral valve is abnormal. Moderate mitral annular calcification. Mild to moderate mitral valve regurgitation. No evidence of mitral valve stenosis. MV peak gradient, 9.0 mmHg. The mean mitral valve gradient is 3.0 mmHg.  Tricuspid Valve: The tricuspid  valve is normal in structure. Tricuspid valve regurgitation is moderate to severe.  Aortic Valve: The aortic valve is tricuspid. Aortic valve regurgitation is not visualized.  Pulmonic Valve: The pulmonic valve was normal in structure. Pulmonic valve regurgitation is moderate. No evidence of pulmonic stenosis.  Aorta: The aortic root and ascending aorta are structurally normal, with no evidence of dilitation.  Pulmonary Artery: The pulmonary artery is mildly dilated.  Venous: The inferior vena cava is normal in size with greater than 50% respiratory variability, suggesting right atrial pressure of 3 mmHg.  IAS/Shunts: No atrial level shunt detected by color flow Doppler.  Additional Comments: Moderate ascites is present.   LEFT VENTRICLE PLAX 2D LVIDd:         5.20 cm   Diastology LVIDs:         3.90 cm   LV e' medial:    6.68 cm/s LV PW:         1.30 cm   LV E/e' medial:  20.4 LV IVS:        1.20 cm   LV e' lateral:   7.93 cm/s LVOT diam:     2.40 cm   LV E/e' lateral: 17.2 LV SV:         106 LV SV Index:   53 LVOT Area:     4.52 cm   RIGHT VENTRICLE RV S prime:     8.55 cm/s TAPSE (M-mode): 1.8 cm  LEFT ATRIUM             Index        RIGHT ATRIUM           Index LA diam:        4.40 cm 2.18 cm/m   RA Area:     17.30 cm LA Vol (A2C):   66.4 ml 32.94 ml/m  RA Volume:   43.80 ml  21.73 ml/m LA Vol (A4C):   80.2 ml 39.79 ml/m LA Biplane Vol: 74.7 ml 37.06 ml/m AORTIC VALVE LVOT Vmax:   102.00 cm/s LVOT Vmean:  69.100 cm/s LVOT VTI:    0.235 m  AORTA Ao Root diam: 3.90 cm Ao Asc diam:  3.50 cm  MITRAL VALVE                TRICUSPID VALVE MV Area (PHT): 3.46 cm     TR Peak grad:   41.2 mmHg MV Area VTI:   2.10 cm     TR Vmax:        321.00 cm/s MV Peak grad:  9.0 mmHg MV Mean grad:  3.0 mmHg     SHUNTS  MV Vmax:       1.50 m/s     Systemic VTI:  0.24 m MV Vmean:      89.5 cm/s    Systemic Diam: 2.40 cm MV Decel Time: 219 msec MV E velocity: 136.00 cm/s MV  A velocity: 99.20 cm/s MV E/A ratio:  1.37  Riley Lam MD Electronically signed by Riley Lam MD Signature Date/Time: 12/29/2021/4:22:29 PM    Final             Risk Assessment/Calculations:             Physical Exam:   VS:  BP (!) 120/52 (BP Location: Left Arm, Patient Position: Sitting, Cuff Size: Normal)   Pulse 63   Ht 5\' 10"  (1.778 m)   Wt 156 lb (70.8 kg)   SpO2 90%   BMI 22.38 kg/m    Wt Readings from Last 3 Encounters:  10/27/23 156 lb (70.8 kg)  10/01/23 146 lb 4 oz (66.3 kg)  07/21/23 140 lb 9.6 oz (63.8 kg)    GEN: Well nourished, well developed in no acute distress NECK: No JVD; No carotid bruits CARDIAC: RRR, no murmurs, rubs, gallops RESPIRATORY:  Clear to auscultation without rales, wheezing or rhonchi  ABDOMEN: Soft, non-tender, non-distended EXTREMITIES:  No edema; No deformity   ASSESSMENT AND PLAN: .     Coronary Artery Disease History of multiple stents and recent hospitalizations for NSTEMI. Patient is currently asymptomatic. Discussed the risks of future cardiac catheterizations given his stage 4 CKD. -Continue aspirin, Plavix, and atorvastatin 80mg  daily. -Consider risks/benefits of future invasive procedures given kidney function.  Chronic Kidney Disease (Stage 4) Stable with recent creatinine of 3.3. -Continue current management.  Chronic diastolic heart Failure Managed with torsemide, hydralazine, and isosorbide mononitrate. Discussed dosing of torsemide. -Continue current medications.  On ACE inhibitor, ARB and ARNI due to poor renal function -Per patient, most of the days, he only takes 20 mg daily of torsemide, when he has mild volume overload, he will start taking 40 mg daily of torsemide.  He rarely takes the 60 mg torsemide.  He is volume seems to be very stable.  Will continue with his sliding scale torsemide for now.  Hypertension Well controlled on current regimen. -Continue amlodipine and metoprolol  succinate.  Hyperlipidemia Well controlled on atorvastatin 80mg  daily. -Continue atorvastatin 80mg  daily.        Dispo: Follow-up with Dr. Antoine Poche in 3 to 4 months  Signed, Azalee Course, [place]

## 2023-11-04 ENCOUNTER — Other Ambulatory Visit: Payer: Self-pay | Admitting: Cardiology

## 2023-11-05 ENCOUNTER — Ambulatory Visit: Payer: Medicare HMO | Admitting: Internal Medicine

## 2023-11-05 ENCOUNTER — Encounter: Payer: Self-pay | Admitting: Internal Medicine

## 2023-11-05 VITALS — BP 120/60 | HR 58 | Ht 70.0 in | Wt 165.2 lb

## 2023-11-05 DIAGNOSIS — Z794 Long term (current) use of insulin: Secondary | ICD-10-CM | POA: Diagnosis not present

## 2023-11-05 DIAGNOSIS — E1165 Type 2 diabetes mellitus with hyperglycemia: Secondary | ICD-10-CM

## 2023-11-05 DIAGNOSIS — E039 Hypothyroidism, unspecified: Secondary | ICD-10-CM

## 2023-11-05 DIAGNOSIS — E785 Hyperlipidemia, unspecified: Secondary | ICD-10-CM

## 2023-11-05 DIAGNOSIS — E1159 Type 2 diabetes mellitus with other circulatory complications: Secondary | ICD-10-CM

## 2023-11-05 LAB — POCT GLYCOSYLATED HEMOGLOBIN (HGB A1C): Hemoglobin A1C: 6.8 % — AB (ref 4.0–5.6)

## 2023-11-05 NOTE — Progress Notes (Signed)
Patient ID: Ethan Mcneil, male   DOB: 10/09/1934, 89 y.o.   MRN: [id number]  HPI: Ethan Mcneil is a 89 y.o.-year-old male, returning for follow-up for DM2, dx in 2013, insulin-dependent since 2017, uncontrolled, with complications (CAD, CHF, PVD, CKD, peripheral neuropathy). Pt. previously saw Dr. Everardo All, but last visit with me 4 months ago.  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   Interim history: + increased urination yesterday, no blurry vision, nausea, chest pain. He has SOB.  He gained weight since last visit.  He feels some of it is fluid.  Reviewed HbA1c: 07/03/2023: HbA1c 6.7% Lab Results  Component Value Date   HGBA1C 6.6 (A) 03/12/2023   HGBA1C 10.1 (A) 12/11/2022   HGBA1C 11.9 (A) 08/08/2022   HGBA1C 8.0 (H) 04/15/2022   HGBA1C 6.4 (A) 12/21/2021   HGBA1C 5.9 (A) 08/21/2021   HGBA1C 6.6 (A) 04/17/2021   HGBA1C 7.1 (A) 01/08/2021   HGBA1C 7.4 (A) 09/04/2020   HGBA1C 9.4 (A) 07/04/2020   At last visit he was on: - Basaglar 7 units at bedtime >> off x2 weeks as he was not able to get a refill >> restarted 12 units daily at night - Humalog 3x a day, before meals: 6-6-6 >> 8-10 units before meals  I advised him to change to: - Basaglar: 10 units am >> 8 units- still in am >> 7 units at bedtime - Novolog: 6-8 units >> 8 units 15 minutes before meals >> 4 to 6 >> 6 units before meals He can also use the following NovoLog sliding scale for correction: - 150-175: + 1 unit - 176-200: + 2 units - 201-225: + 3 units - 226-250: + 4 units - >250: + 5 units  Pt checks his sugars 4x a day and they are (freestyle libre 14 CGM - 28$ per month from the pharmacy):  Previously:  Previously:   Lowest sugar was 110 >> 43 >> 50s >> 57 >> 53; he has hypoglycemia awareness at 70.  Highest sugar was HI >> 700s >> 200s >> 250s >> 398 (Fried oysters). In 2023, he was  admitted with viral gastroenteritis and very high blood sugars, in the 700s, but no DKA.   Glucometer: Freestyle lite  Pt's meals are: - Breakfast: cereal or egg bisquit - Lunch: veggies - Dinner: tomato sandwich, hamburger - Snacks: no sodas; gatorade 0, milk 2%  - 3 glasses, 1 Ensure, fruit smoothie  - + CKD-sees nephrology, last BUN/creatinine:  Lab Results  Component Value Date   BUN 49 (A) 07/30/2023   BUN 44 (H) 06/20/2023   CREATININE 3.3 (A) 07/30/2023   CREATININE 3.44 (H) 06/20/2023   - + HL; last set of lipids: Lab Results  Component Value Date   CHOL 96 10/01/2023   HDL 56.80 10/01/2023   LDLCALC 32 10/01/2023   TRIG 38.0 10/01/2023   CHOLHDL 2 10/01/2023  On Zocor 20 mg daily.  - last eye exam was in 09/03/2023. No DR.   - + numbness and tingling in his feet.  Last foot exam 07/03/2023. He has significant swelling in the L>R leg and wears compression hoses.  He also has a history of HTN, gout, anemia of chronic disease, nephrolithiasis, hypothyroidism.  He is on levothyroxine 150 mcg daily: - in am - fasting - at least 1h from b'fast - no calcium - no iron - no multivitamins - no  PPIs (only as needed)  Latest TSH levels reviewed: Lab Results  Component Value Date   TSH 0.60 10/01/2023   TSH 2.35 06/20/2023   TSH 3.22 11/26/2022   TSH 1.785 04/05/2022   TSH 2.20 12/27/2021   TSH 4.12 11/12/2021   TSH 9.19 (H) 11/02/2021   TSH 10.37 (H) 09/25/2021   TSH 3.20 05/11/2021   TSH 1.64 02/16/2021   ROS: + see HPI  Past Medical History:  Diagnosis Date   Anemia    Arthritis    CAD (coronary artery disease)    CABG 1997; grafts patent @ cath [facility] 11/09   DM2 (diabetes mellitus, type 2) (HCC)    Gout    after tick bite   History of kidney stones    HLD (hyperlipidemia)    HTN (hypertension)    essential nos   Hypothyroid    Pancreatic lesion    Postsurgical aortocoronary bypass status    PVD (peripheral vascular disease) (HCC)    S/P  herniorrhaphy    Tick fever 2012   [facility] [place]   Unspecified hyperplasia of prostate without urinary obstruction and other lower urinary tract symptoms (LUTS)    Urolithiasis 2015   hx of   Wears dentures    Wears glasses    Past Surgical History:  Procedure Laterality Date   BIOPSY  08/07/2020   Procedure: BIOPSY;  Surgeon: Lemar Lofty., MD;  Location: [facility] ENDOSCOPY;  Service: Gastroenterology;;   CATARACT EXTRACTION Bilateral    CORONARY ARTERY BYPASS GRAFT     x7 vessels 1997; cath 2002 and 2009; grafts patent    CYSTOSCOPY WITH RETROGRADE PYELOGRAM, URETEROSCOPY AND STENT PLACEMENT Right 02/21/2014   Procedure: CYSTOSCOPY WITH RIGHT  RETROGRADE PYELOGRAM,RIGHT  URETEROSCOPY WITH STONE BASKETING EXTRACTION;  Surgeon: Bjorn Pippin, MD;  Location: [facility] ORS;  Service: Urology;  Laterality: Right;   ESOPHAGOGASTRODUODENOSCOPY (EGD) WITH PROPOFOL N/A 08/07/2020   Procedure: ESOPHAGOGASTRODUODENOSCOPY (EGD) WITH PROPOFOL;  Surgeon: Meridee Score Netty Starring., MD;  Location: [facility] ENDOSCOPY;  Service: Gastroenterology;  Laterality: N/A;   EUS N/A 08/07/2020   Procedure: UPPER ENDOSCOPIC ULTRASOUND (EUS) LINEAR;  Surgeon: Lemar Lofty., MD;  Location: [facility] ENDOSCOPY;  Service: Gastroenterology;  Laterality: N/A;   FINE NEEDLE ASPIRATION N/A 08/07/2020   Procedure: FINE NEEDLE ASPIRATION (FNA) LINEAR;  Surgeon: Lemar Lofty., MD;  Location: [facility] ENDOSCOPY;  Service: Gastroenterology;  Laterality: N/A;   HERNIA REPAIR     INGUINAL HERNIA REPAIR     LEFT HEART CATH  04/16/2023   LEFT HEART CATH  04/17/2023   MULTIPLE TOOTH EXTRACTIONS     SHOULDER SURGERY     SPLENECTOMY, TOTAL N/A 10/26/2020   Procedure: SPLENECTOMY;  Surgeon: Almond Lint, MD;  Location: [facility] OR;  Service: General;  Laterality: N/A;   Social History   Socioeconomic History   Marital status: Married    Spouse name: Pat   Number of children: 4   Years of education: Not on file    Highest education level: Not on file  Occupational History   Occupation: retired, works few hours   Tobacco Use   Smoking status: Never   Smokeless tobacco: Never  Vaping Use   Vaping status: Never Used  Substance and Sexual Activity   Alcohol use: No   Drug use: No   Sexual activity: Not Currently  Other Topics Concern   Not on file  Social History Narrative   Lives alone   Drives   Son Maurine Minister)  lives near by ;  other son lives 1 hour away       Scientist, research (physical sciences); aviator and former race car driver   4 children (boys)      Social Determinants of Health   Financial Resource Strain: Low Risk  (07/18/2023)   Overall Financial Resource Strain (CARDIA)    Difficulty of Paying Living Expenses: Not hard at all  Food Insecurity: No Food Insecurity (06/16/2023)   Hunger Vital Sign    Worried About Running Out of Food in the Last Year: Never true    Ran Out of Food in the Last Year: Never true  Transportation Needs: No Transportation Needs (06/16/2023)   PRAPARE - Administrator, Civil Service (Medical): No    Lack of Transportation (Non-Medical): No  Physical Activity: Inactive (07/18/2023)   Exercise Vital Sign    Days of Exercise per Week: 0 days    Minutes of Exercise per Session: 0 min  Stress: No Stress Concern Present (07/18/2023)   [employer] of Occupational Health - Occupational Stress Questionnaire    Feeling of Stress : Not at all  Social Connections: Moderately Integrated (07/18/2023)   Social Connection and Isolation Panel [NHANES]    Frequency of Communication with Friends and Family: More than three times a week    Frequency of Social Gatherings with Friends and Family: More than three times a week    Attends Religious Services: More than 4 times per year    Active Member of [employer] or Organizations: Yes    Attends Banker Meetings: More than 4 times per year    Marital Status: Widowed  Intimate Partner Violence: Not At Risk (07/18/2023)    Humiliation, Afraid, Rape, and Kick questionnaire    Fear of Current or Ex-Partner: No    Emotionally Abused: No    Physically Abused: No    Sexually Abused: No   Current Outpatient Medications on File Prior to Visit  Medication Sig Dispense Refill   acetaminophen (TYLENOL) 500 MG tablet Take 500 mg by mouth every 4 (four) hours as needed for moderate pain.     amLODipine (NORVASC) 10 MG tablet TAKE 1 TABLET BY MOUTH EVERY DAY 90 tablet 1   aspirin EC 81 MG tablet Take 81 mg by mouth at bedtime.     atorvastatin (LIPITOR) 80 MG tablet Take 1 tablet (80 mg total) by mouth daily. 90 tablet 3   B-D UF III MINI PEN NEEDLES 31G X 5 MM MISC USE 3 TIMES A DAY AS DIRECTED 100 each 2   Cholecalciferol (VITAMIN D3) 50 MCG (2000 UT) capsule Take 2,000 Units by mouth daily.     clopidogrel (PLAVIX) 75 MG tablet TAKE 1 TABLET BY MOUTH EVERY DAY 90 tablet 3   Continuous Blood Gluc Receiver (FREESTYLE LIBRE 2 READER) DEVI Use as instructed to check blood sugar 1 each 0   Continuous Blood Gluc Sensor (FREESTYLE LIBRE 2 SENSOR) MISC 1 each by Does not apply route every 14 (fourteen) days. 6 each 3   Cyanocobalamin (B-12) 1000 MCG TABS Take 1,000 mcg by mouth daily.     FREESTYLE LITE test strip CHECK BLOOD SUGAR NO MORE THAN TWICE DAILY. 200 strip 0   hydrALAZINE (APRESOLINE) 100 MG tablet Take 1 tablet (100 mg total) by mouth 3 (three) times daily. 270 tablet 3   insulin aspart (FIASP FLEXTOUCH) 100 UNIT/ML FlexTouch Pen INJECT 6-8 UNITS INTO THE SKIN WITH BREAKFAST, WITH LUNCH, AND WITH EVENING MEAL. 15 mL 1   Insulin Glargine (  BASAGLAR KWIKPEN) 100 UNIT/ML Inject 8 Units into the skin at bedtime. 15 mL 11   isosorbide mononitrate (IMDUR) 120 MG 24 hr tablet Take 1 tablet (120 mg total) by mouth daily. 90 tablet 2   levothyroxine (SYNTHROID) 150 MCG tablet Take 1 tablet (150 mcg total) by mouth daily before breakfast. 90 tablet 1   metoprolol succinate (TOPROL-XL) 25 MG 24 hr tablet TAKE 1 TABLET (25 MG  TOTAL) BY MOUTH DAILY. 90 tablet 1   nitroGLYCERIN (NITROSTAT) 0.4 MG SL tablet PLACE 1 TABLET UNDER THE TONGUE EVERY 5 MINUTES X 3 DOSES AS NEEDED FOR CHEST PAIN. 25 tablet 2   Pancrelipase, Lip-Prot-Amyl, (ZENPEP) 25000-79000 units CPEP Take 2 capsules by mouth with every meal and snacks (Patient taking differently: Take 1 capsule by mouth daily at 12 noon.) 540 capsule 1   pantoprazole (PROTONIX) 40 MG tablet TAKE 1 TABLET BY MOUTH EVERY DAY 90 tablet 1   sodium bicarbonate 650 MG tablet Take 650 mg by mouth 2 (two) times daily.     torsemide (DEMADEX) 20 MG tablet TAKE 3 TABLETS BY MOUTH EVERY DAY (Patient taking differently: Take 20 mg by mouth daily.) 270 tablet 0   No current facility-administered medications on file prior to visit.   Allergies  Allergen Reactions   Ciprofloxacin Itching   Codeine Nausea And Vomiting   Family History  Problem Relation Age of Onset   Lung cancer Father    Coronary artery disease Mother        stent   Diabetes Brother    Prostate cancer Maternal Uncle        in his 28s   Stroke Son        GF   Throat cancer Son    Colon cancer Neg Hx    Rectal cancer Neg Hx    Stomach cancer Neg Hx    Esophageal cancer Neg Hx    Pancreatic cancer Neg Hx    PE: BP 120/60   Pulse (!) 58   Ht 5\' 10"  (1.778 m)   Wt 165 lb 3.2 oz (74.9 kg)   SpO2 92%   BMI 23.70 kg/m  Wt Readings from Last 10 Encounters:  11/05/23 165 lb 3.2 oz (74.9 kg)  10/27/23 156 lb (70.8 kg)  10/01/23 146 lb 4 oz (66.3 kg)  07/21/23 140 lb 9.6 oz (63.8 kg)  07/18/23 135 lb (61.2 kg)  07/03/23 140 lb 9.6 oz (63.8 kg)  06/20/23 140 lb 2 oz (63.6 kg)  05/20/23 136 lb 6.4 oz (61.9 kg)  04/25/23 133 lb 8 oz (60.6 kg)  04/09/23 147 lb 6.4 oz (66.9 kg)   Constitutional: normal weight, in NAD Eyes: no exophthalmos ENT:  no thyromegaly, no cervical lymphadenopathy Cardiovascular: RRR, No MRG, + L>R LE edema Respiratory: CTA B Musculoskeletal: no deformities Skin: no  rashes Neurological: no tremor with outstretched hands  ASSESSMENT: 1. DM2, insulin-dependent, uncontrolled, with complications - CAD, h/o CABG - CHF - PVD - CKD stage IV - Peripheral neuropathy - h/o hypoglycemia x1 in 2021  2. HL  3.  Hypothyroidism  PLAN:  1. Patient with longstanding, uncontrolled, insulin-dependent diabetes, on basal/bolus insulin regimen, with improving control.  At last visit, HbA1c was still well-controlled, at 6.7%, slightly higher than before.  Sugars were elevated at night and during the first half of the day after which they were improving with occasional lows in the afternoon and evening.  Upon questioning, he forgot to move the Basaglar dose at night and  he was still taking it in the morning.  I again advised him to move the basal insulin at night.  We did not change the rest of the regimen but I did advise him to let me know if he had any more lows. CGM interpretation: -At today's visit, we reviewed his CGM downloads: It appears that 58% of values are in target range (goal >70%), while 40% are higher than 180 (goal <25%), and 2% are lower than 70 (goal <4%).  The calculated average blood sugar is 170.  The projected HbA1c for the next 3 months (GMI) is 7.4%. -Reviewing the CGM trends, sugars are fluctuating mostly within the target range with a significant increase after late lunch and fluctuating blood sugars afterwards.  Upon questioning, he is taking a fixed dose of Fiasp, 6 units, 15 minutes before each meal.  We discussed that ideally he would vary the dose based on the size and consistency of his meals.  I advised him to take 5 units for smaller meal as this may prevent some of his low blood sugars later in the day but also for larger meals, to increase the dose to 7 units and, for holiday meals, even 8 units.  We also discussed that if his sugars are dropping after the Fiasp injection, he may need to move this closer to the meal. -Since sugars are higher  throughout the night, I also advised him to increase the dose of Basaglar by 1 unit.  He is taking it at bedtime now. - I suggested to:  Patient Instructions  Please increase: - Basaglar 8 units at bedtime - FiAsp 5-7 (8) units right before the meals FiAsp sliding scale for correction: - 150-175: + 1 unit - 176-200: + 2 units - 201-225: + 3 units - 226-250: + 4 units - >250: + 5 units  Please return in 3 to 4 months.  - we checked his HbA1c: 6.8% (slightly higher) - advised to check sugars at different times of the day - 4x a day, rotating check times - advised for yearly eye exams >> he is UTD - return to clinic in 3-4 months  2. HL -At last check, all lipid fractions were at goal: Lab Results  Component Value Date   CHOL 96 10/01/2023   HDL 56.80 10/01/2023   LDLCALC 32 10/01/2023   TRIG 38.0 10/01/2023   CHOLHDL 2 10/01/2023  -He continues Zocor 20 mg daily without side effects  3.  Hypothyroidism - latest thyroid labs reviewed with pt. >> normal: Lab Results  Component Value Date   TSH 0.60 10/01/2023  - he continues on LT4 150 mcg daily - pt feels good on this dose. - we discussed about taking the thyroid hormone every day, with water, >30 minutes before breakfast, separated by >4 hours from acid reflux medications, calcium, iron, multivitamins. Pt. is taking it correctly.  Carlus Pavlov, MD PhD [facility] Endocrinology

## 2023-11-05 NOTE — Patient Instructions (Addendum)
Please increase: - Basaglar 8 units at bedtime - FiAsp 5-7 (8) units right before the meals FiAsp sliding scale for correction: - 150-175: + 1 unit - 176-200: + 2 units - 201-225: + 3 units - 226-250: + 4 units - >250: + 5 units  Please return in 3 to 4 months.

## 2023-11-15 DIAGNOSIS — Z9841 Cataract extraction status, right eye: Secondary | ICD-10-CM | POA: Diagnosis not present

## 2023-11-15 DIAGNOSIS — Z90411 Acquired partial absence of pancreas: Secondary | ICD-10-CM | POA: Diagnosis not present

## 2023-11-15 DIAGNOSIS — I272 Pulmonary hypertension, unspecified: Secondary | ICD-10-CM | POA: Diagnosis not present

## 2023-11-15 DIAGNOSIS — Z7902 Long term (current) use of antithrombotics/antiplatelets: Secondary | ICD-10-CM | POA: Diagnosis not present

## 2023-11-15 DIAGNOSIS — Z66 Do not resuscitate: Secondary | ICD-10-CM | POA: Diagnosis not present

## 2023-11-15 DIAGNOSIS — I5033 Acute on chronic diastolic (congestive) heart failure: Secondary | ICD-10-CM | POA: Diagnosis not present

## 2023-11-15 DIAGNOSIS — R600 Localized edema: Secondary | ICD-10-CM | POA: Diagnosis not present

## 2023-11-15 DIAGNOSIS — D136 Benign neoplasm of pancreas: Secondary | ICD-10-CM | POA: Diagnosis not present

## 2023-11-15 DIAGNOSIS — J9811 Atelectasis: Secondary | ICD-10-CM | POA: Diagnosis not present

## 2023-11-15 DIAGNOSIS — D631 Anemia in chronic kidney disease: Secondary | ICD-10-CM | POA: Diagnosis not present

## 2023-11-15 DIAGNOSIS — N184 Chronic kidney disease, stage 4 (severe): Secondary | ICD-10-CM | POA: Diagnosis not present

## 2023-11-15 DIAGNOSIS — I2510 Atherosclerotic heart disease of native coronary artery without angina pectoris: Secondary | ICD-10-CM | POA: Diagnosis not present

## 2023-11-15 DIAGNOSIS — Z9842 Cataract extraction status, left eye: Secondary | ICD-10-CM | POA: Diagnosis not present

## 2023-11-15 DIAGNOSIS — I081 Rheumatic disorders of both mitral and tricuspid valves: Secondary | ICD-10-CM | POA: Diagnosis not present

## 2023-11-15 DIAGNOSIS — K8681 Exocrine pancreatic insufficiency: Secondary | ICD-10-CM | POA: Diagnosis not present

## 2023-11-15 DIAGNOSIS — I5023 Acute on chronic systolic (congestive) heart failure: Secondary | ICD-10-CM | POA: Diagnosis not present

## 2023-11-15 DIAGNOSIS — R0602 Shortness of breath: Secondary | ICD-10-CM | POA: Diagnosis not present

## 2023-11-15 DIAGNOSIS — Z9081 Acquired absence of spleen: Secondary | ICD-10-CM | POA: Diagnosis not present

## 2023-11-15 DIAGNOSIS — Z7982 Long term (current) use of aspirin: Secondary | ICD-10-CM | POA: Diagnosis not present

## 2023-11-15 DIAGNOSIS — D0510 Intraductal carcinoma in situ of unspecified breast: Secondary | ICD-10-CM | POA: Diagnosis not present

## 2023-11-15 DIAGNOSIS — E039 Hypothyroidism, unspecified: Secondary | ICD-10-CM | POA: Diagnosis not present

## 2023-11-15 DIAGNOSIS — Z951 Presence of aortocoronary bypass graft: Secondary | ICD-10-CM | POA: Diagnosis not present

## 2023-11-15 DIAGNOSIS — E1122 Type 2 diabetes mellitus with diabetic chronic kidney disease: Secondary | ICD-10-CM | POA: Diagnosis not present

## 2023-11-15 DIAGNOSIS — Z1152 Encounter for screening for COVID-19: Secondary | ICD-10-CM | POA: Diagnosis not present

## 2023-11-15 DIAGNOSIS — K8689 Other specified diseases of pancreas: Secondary | ICD-10-CM | POA: Diagnosis not present

## 2023-11-15 DIAGNOSIS — Z794 Long term (current) use of insulin: Secondary | ICD-10-CM | POA: Diagnosis not present

## 2023-11-15 DIAGNOSIS — I214 Non-ST elevation (NSTEMI) myocardial infarction: Secondary | ICD-10-CM | POA: Diagnosis not present

## 2023-11-15 DIAGNOSIS — E875 Hyperkalemia: Secondary | ICD-10-CM | POA: Diagnosis not present

## 2023-11-15 DIAGNOSIS — I252 Old myocardial infarction: Secondary | ICD-10-CM | POA: Diagnosis not present

## 2023-11-15 DIAGNOSIS — J90 Pleural effusion, not elsewhere classified: Secondary | ICD-10-CM | POA: Diagnosis not present

## 2023-11-15 DIAGNOSIS — R7989 Other specified abnormal findings of blood chemistry: Secondary | ICD-10-CM | POA: Diagnosis not present

## 2023-11-15 DIAGNOSIS — I082 Rheumatic disorders of both aortic and tricuspid valves: Secondary | ICD-10-CM | POA: Diagnosis not present

## 2023-11-15 DIAGNOSIS — I5021 Acute systolic (congestive) heart failure: Secondary | ICD-10-CM | POA: Diagnosis not present

## 2023-11-15 DIAGNOSIS — N179 Acute kidney failure, unspecified: Secondary | ICD-10-CM | POA: Diagnosis not present

## 2023-11-15 DIAGNOSIS — J811 Chronic pulmonary edema: Secondary | ICD-10-CM | POA: Diagnosis not present

## 2023-11-15 DIAGNOSIS — E782 Mixed hyperlipidemia: Secondary | ICD-10-CM | POA: Diagnosis not present

## 2023-11-15 DIAGNOSIS — I130 Hypertensive heart and chronic kidney disease with heart failure and stage 1 through stage 4 chronic kidney disease, or unspecified chronic kidney disease: Secondary | ICD-10-CM | POA: Diagnosis not present

## 2023-11-16 DIAGNOSIS — I5021 Acute systolic (congestive) heart failure: Secondary | ICD-10-CM | POA: Diagnosis not present

## 2023-11-17 DIAGNOSIS — I5021 Acute systolic (congestive) heart failure: Secondary | ICD-10-CM | POA: Diagnosis not present

## 2023-11-17 DIAGNOSIS — I082 Rheumatic disorders of both aortic and tricuspid valves: Secondary | ICD-10-CM | POA: Diagnosis not present

## 2023-11-17 DIAGNOSIS — I272 Pulmonary hypertension, unspecified: Secondary | ICD-10-CM | POA: Diagnosis not present

## 2023-11-18 DIAGNOSIS — I5021 Acute systolic (congestive) heart failure: Secondary | ICD-10-CM | POA: Diagnosis not present

## 2023-11-19 ENCOUNTER — Other Ambulatory Visit: Payer: Self-pay | Admitting: Internal Medicine

## 2023-11-19 DIAGNOSIS — I2510 Atherosclerotic heart disease of native coronary artery without angina pectoris: Secondary | ICD-10-CM | POA: Diagnosis not present

## 2023-11-19 DIAGNOSIS — D631 Anemia in chronic kidney disease: Secondary | ICD-10-CM | POA: Diagnosis not present

## 2023-11-19 DIAGNOSIS — I214 Non-ST elevation (NSTEMI) myocardial infarction: Secondary | ICD-10-CM | POA: Diagnosis not present

## 2023-11-19 DIAGNOSIS — N184 Chronic kidney disease, stage 4 (severe): Secondary | ICD-10-CM | POA: Diagnosis not present

## 2023-11-19 DIAGNOSIS — D0510 Intraductal carcinoma in situ of unspecified breast: Secondary | ICD-10-CM | POA: Diagnosis not present

## 2023-11-19 DIAGNOSIS — E039 Hypothyroidism, unspecified: Secondary | ICD-10-CM | POA: Diagnosis not present

## 2023-11-19 DIAGNOSIS — R600 Localized edema: Secondary | ICD-10-CM | POA: Diagnosis not present

## 2023-11-19 DIAGNOSIS — Z9081 Acquired absence of spleen: Secondary | ICD-10-CM | POA: Diagnosis not present

## 2023-11-19 DIAGNOSIS — E782 Mixed hyperlipidemia: Secondary | ICD-10-CM | POA: Diagnosis not present

## 2023-11-19 DIAGNOSIS — K8689 Other specified diseases of pancreas: Secondary | ICD-10-CM | POA: Diagnosis not present

## 2023-11-19 DIAGNOSIS — I5023 Acute on chronic systolic (congestive) heart failure: Secondary | ICD-10-CM | POA: Diagnosis not present

## 2023-11-19 DIAGNOSIS — E1122 Type 2 diabetes mellitus with diabetic chronic kidney disease: Secondary | ICD-10-CM | POA: Diagnosis not present

## 2023-11-20 ENCOUNTER — Telehealth: Payer: Self-pay

## 2023-11-20 NOTE — Transitions of Care (Post Inpatient/ED Visit) (Addendum)
11/20/2023  Name: Ethan Mcneil MRN: [id number] DOB: 02-18-1934  Today's TOC FU Call Status: Today's TOC FU Call Status:: Successful TOC FU Call Completed TOC FU Call Complete Date: 11/20/23 Patient's Name and Date of Birth confirmed.  Transition Care Management Follow-up Telephone Call Date of Discharge: 11/19/23 Discharge Facility: Other Recruitment consultant Facility) [facility]) Name of Other (Non[facility]) Discharge Facility: [facility] Type of Discharge: Inpatient Admission Primary Inpatient Discharge Diagnosis:: CHF/fluid build up. How have you been since you were released from the hospital?: Better (Head feels spinning) Any questions or concerns?: No  Items Reviewed: Did you receive and understand the discharge instructions provided?: Yes Do you have support at home?: Yes People in Home: alone (4 sons that live nearby) Name of Support/Comfort Primary Source: Otho Ket  Medications Reviewed Today: Medications Reviewed Today     Reviewed by Bing Quarry, RN (Case Manager) on 11/20/23 at 1635  Med List Status: <None>   Medication Order Taking? Sig Documenting Provider Last Dose Status Informant  acetaminophen (TYLENOL) 500 MG tablet [id number] Yes Take 500 mg by mouth every 4 (four) hours as needed for moderate pain. [provider] Taking Active Self, Child  amLODipine (NORVASC) 10 MG tablet [id number] Yes TAKE 1 TABLET BY MOUTH EVERY DAY Wanda Plump, MD Taking Active   aspirin EC 81 MG tablet [id number] Yes Take 81 mg by mouth at bedtime. [provider] Taking Active Self, Child           Med Note Carola Rhine Jan 07, 2022  9:18 AM)    atorvastatin (LIPITOR) 80 MG tablet [id number] Yes Take 1 tablet (80 mg total) by mouth daily. Rollene Rotunda, MD Taking Active   B-D UF III MINI PEN NEEDLES 31G X 5 MM MISC [id number] Yes USE 3 TIMES A DAY AS DIRECTED Romero Belling, MD Taking Active Self, Child  Cholecalciferol (VITAMIN D3) 50 MCG (2000 UT)  capsule [id number] Yes Take 2,000 Units by mouth daily. [provider] Taking Active Self, Child  clopidogrel (PLAVIX) 75 MG tablet [id number] Yes TAKE 1 TABLET BY MOUTH EVERY DAY Rollene Rotunda, MD Taking Active   Continuous Blood Gluc Receiver (FREESTYLE LIBRE 2 READER) DEVI [id number]  Use as instructed to check blood sugar Carlus Pavlov, MD  Active   Continuous Glucose Sensor (FREESTYLE LIBRE 2 SENSOR) [place] [id number] Yes CHANGE EVERY 14 DAYS AS DIRECTED Carlus Pavlov, MD Taking Active   Cyanocobalamin (B-12) 1000 MCG TABS [id number] Yes Take 1,000 mcg by mouth daily. [provider] Taking Active Self, Child  FREESTYLE LITE test strip [id number] Yes CHECK BLOOD SUGAR NO MORE THAN TWICE DAILY. Romero Belling, MD Taking Active Self, Child  hydrALAZINE (APRESOLINE) 100 MG tablet [id number] Yes Take 1 tablet (100 mg total) by mouth 3 (three) times daily. Rollene Rotunda, MD Taking Active            Med Note Marilu Favre Jun 16, 2023  1:34 PM) 06/16/23: reports during [facility] call checks BP prior to taking-- he then decides whether or not to take around BP readings-- reports takes periodically  insulin aspart (FIASP FLEXTOUCH) 100 UNIT/ML FlexTouch Pen [id number] Yes INJECT 6-8 UNITS INTO THE SKIN WITH BREAKFAST, WITH LUNCH, AND WITH EVENING MEAL. Carlus Pavlov, MD Taking Active   Insulin Glargine [facility]) 100 UNIT/ML [id number] Yes Inject 8 Units into the skin at bedtime. Carlus Pavlov, MD Taking Active   isosorbide mononitrate (IMDUR) 120 MG 24 hr tablet  [id number] Yes Take 1 tablet (120 mg total) by mouth daily. Sharlene Dory, DO Taking Active   levothyroxine (SYNTHROID) 150 MCG tablet [id number] Yes Take 1 tablet (150 mcg total) by mouth daily before breakfast. Wanda Plump, MD Taking Active   metoprolol succinate (TOPROL-XL) 25 MG 24 hr tablet [id number] Yes TAKE 1 TABLET (25 MG TOTAL) BY MOUTH DAILY. Rollene Rotunda, MD Taking Active   nitroGLYCERIN  (NITROSTAT) 0.4 MG SL tablet [id number] Yes PLACE 1 TABLET UNDER THE TONGUE EVERY 5 MINUTES X 3 DOSES AS NEEDED FOR CHEST PAIN. Azalee Course, [place] Taking Active            Med Note (CANTER, Vicente Males D   Wed Oct 01, 2023  8:28 AM) PRN  Roselee Nova [facility]) 25000-79000 units CPEP [id number] Yes Take 2 capsules by mouth with every meal and snacks Wanda Plump, MD Taking Active Self, Child  pantoprazole (PROTONIX) 40 MG tablet [id number] Yes TAKE 1 TABLET BY MOUTH EVERY DAY Sharlene Dory, DO Taking Active   sodium bicarbonate 650 MG tablet [id number] Yes Take 650 mg by mouth 2 (two) times daily. [provider] Taking Active Self, Child  torsemide (DEMADEX) 20 MG tablet [id number] Yes TAKE 3 TABLETS BY MOUTH EVERY DAY  Patient taking differently: Take 20 mg by mouth daily.   Wanda Plump, MD Taking Active            Med Note Carollee Herter Oct 27, 2023  8:10 AM) Patient takes 2 tablets daily            Home Care and Equipment/Supplies: Were Home Health Services Ordered?: No (Cardiac Rehab referral as OP.)  Functional Questionnaire: Do you need assistance with bathing/showering or dressing?: No Do you need assistance with meal preparation?: No (4 sons nearby that provide.) Do you need assistance with eating?: No Do you need assistance with getting out of bed/getting out of a chair/moving?: No (Slow)  Follow up appointments reviewed: PCP Follow-up appointment confirmed?: Yes Date of PCP follow-up appointment?: 11/20/23 Follow-up Provider: Willow Ora (PCP office reached out) Specialist Hospital Follow-up appointment confirmed?: Yes (Nephrology at [facility]) Date of Specialist follow-up appointment?:  (next week, can't remember date.) Follow-Up Specialty Provider::  (Also Has a Cardiac Rehab Referral in [facility] patient will follow upon.) Do you need transportation to your follow-up appointment?: No Do you understand care options if your condition(s)  worsen?: Yes-patient verbalized understanding (Has a scale and weighs self everyday.)  SDOH Interventions Today    Flowsheet Row Most Recent Value  SDOH Interventions   Food Insecurity Interventions Intervention Not Indicated  Housing Interventions Intervention Not Indicated  Transportation Interventions Intervention Not Indicated  [Has 4 sons that assist.]  Utilities Interventions Intervention Not Indicated  Health Literacy Interventions Intervention Not Indicated      11/20/23: Spoke with patient via telephone for [facility] post discharge follow up. Reviewed mediation list to update in EMR. Reviewed allergies, follow up appointment, referrals, SDOH, what to call PCP for, and discharge instructions. Patient has a scale to weigh daily and takes BP prior to taking BP medications. Encouraged patient to keep a log to take to provider of blood sugar, weights, and BP measurements, stressing importance of providers being able to see trends vs spot checks. Patient lives alone but has 4 sons that live near by and assist with meals, care, transportation. OP Cardiac Rehab referral and patient has contact information. Has a nephrology appointment next week at [facility].  F/U with PCP scheduled for 11/21/23 after PCP office reached out to patient to schedule post discharge follow up.  Declined any further follow up calls by [facility] TOC.   Gabriel Cirri MSN, RN [facility] Management Coordinator [email or web address] Direct Dial: [phone] Fax: [phone]

## 2023-11-21 ENCOUNTER — Ambulatory Visit (INDEPENDENT_AMBULATORY_CARE_PROVIDER_SITE_OTHER): Payer: Medicare HMO | Admitting: Internal Medicine

## 2023-11-21 ENCOUNTER — Encounter: Payer: Self-pay | Admitting: Internal Medicine

## 2023-11-21 VITALS — BP 102/62 | HR 62 | Temp 98.0°F | Resp 18 | Ht 70.0 in | Wt 158.0 lb

## 2023-11-21 DIAGNOSIS — E1159 Type 2 diabetes mellitus with other circulatory complications: Secondary | ICD-10-CM | POA: Diagnosis not present

## 2023-11-21 DIAGNOSIS — I5041 Acute combined systolic (congestive) and diastolic (congestive) heart failure: Secondary | ICD-10-CM

## 2023-11-21 DIAGNOSIS — I130 Hypertensive heart and chronic kidney disease with heart failure and stage 1 through stage 4 chronic kidney disease, or unspecified chronic kidney disease: Secondary | ICD-10-CM | POA: Diagnosis not present

## 2023-11-21 DIAGNOSIS — I2583 Coronary atherosclerosis due to lipid rich plaque: Secondary | ICD-10-CM | POA: Diagnosis not present

## 2023-11-21 DIAGNOSIS — I509 Heart failure, unspecified: Secondary | ICD-10-CM

## 2023-11-21 DIAGNOSIS — Z794 Long term (current) use of insulin: Secondary | ICD-10-CM | POA: Diagnosis not present

## 2023-11-21 DIAGNOSIS — I2510 Atherosclerotic heart disease of native coronary artery without angina pectoris: Secondary | ICD-10-CM | POA: Diagnosis not present

## 2023-11-21 DIAGNOSIS — N184 Chronic kidney disease, stage 4 (severe): Secondary | ICD-10-CM | POA: Diagnosis not present

## 2023-11-21 NOTE — Patient Instructions (Addendum)
It was good to see you.  Continue checking your weight.  If it increase by 3 to 4 pounds, please reach out to cardiology.  Continue holding hydralazine if your blood pressure is in the low 100s.  See you in February, sooner if needed.  We are arranging cardiology referral for hospital follow-up

## 2023-11-21 NOTE — Progress Notes (Unsigned)
Subjective:    Patient ID: Ethan Mcneil, male    DOB: 1934-11-30, 89 y.o.   MRN: [id number]  DOS:  11/21/2023 Type of visit - description:  Hospital follow-up, here with his son  The patient reports leg swelling and cough prior to being admitted to an outside hospital. No fever or chills, no headache, no rash.  No unusual aches or pains. Presented to the hospital with shortness of breath, was Dx with the following:  -Acute exacerbation of CHF.  Weight decreased from 168 to 144 pounds. - Acute on chronic kidney failure.  On presentation creatinine was 4.5, on discharge trending down to 4.0. - Increase troponins, likely due to CHF.  Continue routine CAD care. - Discharged on torsemide 20 mg: 3 tablets daily   - Last hemoglobin 9.3, platelets 147. - Last potassium 3.7, creatinine 4.0. - Calcium 7.7, low. - Magnesium levels normal  Since he left the hospital he is at home. Reports he feels poorly, still tired, appetite is decreased. Denies shortness of breath.  Admits to occasional chest pain which for him is essentially at baseline. Has monitor his weight at home and is about the same Cough has decreased.   Review of Systems See above   Past Medical History:  Diagnosis Date   Anemia    Arthritis    CAD (coronary artery disease)    CABG 1997; grafts patent @ cath [facility] 11/09   DM2 (diabetes mellitus, type 2) (HCC)    Gout    after tick bite   History of kidney stones    HLD (hyperlipidemia)    HTN (hypertension)    essential nos   Hypothyroid    Pancreatic lesion    Postsurgical aortocoronary bypass status    PVD (peripheral vascular disease) (HCC)    S/P herniorrhaphy    Tick fever 2012   [facility] [place]   Unspecified hyperplasia of prostate without urinary obstruction and other lower urinary tract symptoms (LUTS)    Urolithiasis 2015   hx of   Wears dentures    Wears glasses     Past Surgical History:  Procedure Laterality Date   BIOPSY   08/07/2020   Procedure: BIOPSY;  Surgeon: Lemar Lofty., MD;  Location: [facility] ENDOSCOPY;  Service: Gastroenterology;;   CATARACT EXTRACTION Bilateral    CORONARY ARTERY BYPASS GRAFT     x7 vessels 1997; cath 2002 and 2009; grafts patent    CYSTOSCOPY WITH RETROGRADE PYELOGRAM, URETEROSCOPY AND STENT PLACEMENT Right 02/21/2014   Procedure: CYSTOSCOPY WITH RIGHT  RETROGRADE PYELOGRAM,RIGHT  URETEROSCOPY WITH STONE BASKETING EXTRACTION;  Surgeon: Bjorn Pippin, MD;  Location: [facility] ORS;  Service: Urology;  Laterality: Right;   ESOPHAGOGASTRODUODENOSCOPY (EGD) WITH PROPOFOL N/A 08/07/2020   Procedure: ESOPHAGOGASTRODUODENOSCOPY (EGD) WITH PROPOFOL;  Surgeon: Meridee Score Netty Starring., MD;  Location: [facility] ENDOSCOPY;  Service: Gastroenterology;  Laterality: N/A;   EUS N/A 08/07/2020   Procedure: UPPER ENDOSCOPIC ULTRASOUND (EUS) LINEAR;  Surgeon: Lemar Lofty., MD;  Location: [facility] ENDOSCOPY;  Service: Gastroenterology;  Laterality: N/A;   FINE NEEDLE ASPIRATION N/A 08/07/2020   Procedure: FINE NEEDLE ASPIRATION (FNA) LINEAR;  Surgeon: Lemar Lofty., MD;  Location: [facility] ENDOSCOPY;  Service: Gastroenterology;  Laterality: N/A;   HERNIA REPAIR     INGUINAL HERNIA REPAIR     LEFT HEART CATH  04/16/2023   LEFT HEART CATH  04/17/2023   MULTIPLE TOOTH EXTRACTIONS     SHOULDER SURGERY     SPLENECTOMY, TOTAL N/A 10/26/2020  Procedure: SPLENECTOMY;  Surgeon: Almond Lint, MD;  Location: [facility] OR;  Service: General;  Laterality: N/A;    Current Outpatient Medications  Medication Instructions   acetaminophen (TYLENOL) 500 mg, Oral, Every 4 hours PRN   amLODipine (NORVASC) 10 mg, Oral, Daily   aspirin EC 81 mg, Oral, Daily at bedtime   atorvastatin (LIPITOR) 80 mg, Oral, Daily   B-12 1,000 mcg, Oral, Daily   B-D UF III MINI PEN NEEDLES 31G X 5 MM MISC USE 3 TIMES A DAY AS DIRECTED   Basaglar KwikPen 8 Units, Subcutaneous, Daily at bedtime   clopidogrel (PLAVIX) 75 mg, Oral, Daily    Continuous Blood Gluc Receiver (FREESTYLE LIBRE 2 READER) DEVI Use as instructed to check blood sugar   Continuous Glucose Sensor (FREESTYLE LIBRE 2 SENSOR) MISC CHANGE EVERY 14 DAYS AS DIRECTED   FREESTYLE LITE test strip CHECK BLOOD SUGAR NO MORE THAN TWICE DAILY.   hydrALAZINE (APRESOLINE) 100 mg, Oral, 3 times daily   insulin aspart (FIASP FLEXTOUCH) 100 UNIT/ML FlexTouch Pen INJECT 6-8 UNITS INTO THE SKIN WITH BREAKFAST, WITH LUNCH, AND WITH EVENING MEAL.   isosorbide mononitrate (IMDUR) 120 mg, Oral, Daily   levothyroxine (SYNTHROID) 150 mcg, Oral, Daily before breakfast   metoprolol succinate (TOPROL-XL) 25 mg, Oral, Daily   nitroGLYCERIN (NITROSTAT) 0.4 MG SL tablet PLACE 1 TABLET UNDER THE TONGUE EVERY 5 MINUTES X 3 DOSES AS NEEDED FOR CHEST PAIN.   Pancrelipase, Lip-Prot-Amyl, (ZENPEP) 25000-79000 units CPEP Take 2 capsules by mouth with every meal and snacks   pantoprazole (PROTONIX) 40 mg, Oral, Daily   sodium bicarbonate 650 mg, Oral, 2 times daily   torsemide (DEMADEX) 60 mg, Oral, Daily   Vitamin D3 2,000 Units, Oral, Daily       Objective:   Physical Exam BP 102/62   Pulse 62   Temp 98 F (36.7 C) (Oral)   Resp 18   Ht 5\' 10"  (1.778 m)   Wt 158 lb (71.7 kg)   SpO2 94%   BMI 22.67 kg/m  General:   Well developed, sits on a wheelchair, underweight appearing, looks chronically ill but not toxic. HEENT:  Normocephalic . Face symmetric, atraumatic Lungs:  CTA B Normal respiratory effort, no intercostal retractions, no accessory muscle use. Heart: RRR, + systolic murmur.  Lower extremities: No pretibial edema on the right, + edema on the left, at baseline Skin: Not pale. Not jaundice Neurologic:  alert & oriented X3.  Speech normal, gait appropriate for age and unassisted Psych--  Cognition and judgment appear intact.  Cooperative with normal attention span and concentration.  Behavior appropriate. No anxious or depressed appearing.      Assessment      Assessment DM -- w/ CAD, no neuropathy, intolerant to metformin , sees ENDO HTN Hyperkalemia-- off  ACEs Hyperlipidemia Hypothyroidism DJD BPH, LUTS,h/o urolithiasis -- sees urology CKD: started to see renal 06/2020 Anemia  CV: --CAD, CABG 1997, cardiac cath 2009 --PVD (no ABIs in the chart) --L leg edema, chronic, (-) DVT per [place] 2015 -- CHF -- NSTEMI and PCI 05/2023 Gout GI: Pancreatic insufficiency dx 02-2017 B 12 deficiency, mild 2015  Wt loss:  2012 187 lb, 2014 173 lb , 2015 167 01-2017: EGD normal, BX chronic active gastritis. Colonoscopy normal. Saw GI 02-2017: likely d/t Pancreatic insufficiency and poorly controlled diabetes. 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  Surgeries  10-2020 Asplenia - PNM 23 : 2011; PNM 13: 11-2015.  PNM 20: 09-2021 -Meningococcal: #1  11-02-21, #2 02/2022. Boost q 5 years -meningococcal B: 02/2022 and 06-04-2022.  Guidelines: booster 1 year after primary series then every 2 to 3 years.  Boost today.  - haemophilus influenza:   11-02-21. Completed   PLAN:   CHF exacerbation, CAD, increased troponins: Admitted to outside hospital, weight decreased from 168 to 145 pounds, currently back on his routine cardiac medications that includes torsemide 60 mg daily.  Weight at home has been stable. He admits to episodic chest pain which is nothing new to him. Plan:  Refer to cardiology for hospital follow-up. Was referred to cardiac rehab, first appointment next week HTN: On multiple meds, I encouraged him to continue holding hydralazine if BP is in the low 100s. DM: On insulin 3 times daily, appetite is poor, recommend to take a low-dose of insulin or skip it if he is not eating at least bolus meal.   Acute on chronic CKD: To have blood work drawn today for his upcoming nephrology appointment next week. RTC already  scheduled for February 2025    1-24 DM: Per Endo. HTN: BP seems well-controlled with current meds. CAD: Saw cardiology 07/21/2023, Rx aggressive risk reduction.  Check FLP.   Hypothyroidism: Check a TSH.   CKD Saw nephrology 04/2023, CKD felt to be related to DM, HTN and CHF.  Urinalysis was bland.  Dialysis was discussed, was rec to stay off ACE or ARB's.  A1c goal less than 7.  Creatinine on 04/29/2023: 3.1. Saw nephrology again 07-2023, no note, creatinine was 3.3 per [facility] Anemia: Per renal note May 2024, he is on ESA and iron injections.   Asplenia: Guidelines reviewed, meningococcal B booster today. Vaccine advice: Recommend COVID booster in few days. RTC CPX 4 months

## 2023-11-22 NOTE — Assessment & Plan Note (Signed)
CHF exacerbation, CAD, increased troponins: Admitted to outside hospital, weight decreased from 168 to 145 pounds, currently back on his routine cardiac medications that includes torsemide 60 mg daily.  Weight at home has been stable. He admits to episodic chest pain which is nothing new to him. Plan:  Refer to cardiology for hospital follow-up. Was referred to cardiac rehab, first appointment next week Viral illness? Felt poorly before admission still coughing some, no F/C but overall slt better  HTN: On multiple meds, I encouraged him to continue holding hydralazine if BP is in the low 100s. DM: On insulin 3 times daily, appetite is poor, recommend to take a low-dose of insulin or skip insulin if he is not eating a regular meal. Acute on chronic CKD: To have blood work drawn today for his upcoming nephrology appointment next week. RTC already scheduled for February 2025

## 2023-11-24 DIAGNOSIS — I214 Non-ST elevation (NSTEMI) myocardial infarction: Secondary | ICD-10-CM | POA: Diagnosis not present

## 2023-11-24 DIAGNOSIS — I252 Old myocardial infarction: Secondary | ICD-10-CM | POA: Diagnosis not present

## 2023-11-24 DIAGNOSIS — I5022 Chronic systolic (congestive) heart failure: Secondary | ICD-10-CM | POA: Diagnosis not present

## 2023-11-24 DIAGNOSIS — I5033 Acute on chronic diastolic (congestive) heart failure: Secondary | ICD-10-CM | POA: Diagnosis not present

## 2023-11-26 DIAGNOSIS — E1122 Type 2 diabetes mellitus with diabetic chronic kidney disease: Secondary | ICD-10-CM | POA: Diagnosis not present

## 2023-11-26 DIAGNOSIS — E872 Acidosis, unspecified: Secondary | ICD-10-CM | POA: Diagnosis not present

## 2023-11-26 DIAGNOSIS — R600 Localized edema: Secondary | ICD-10-CM | POA: Diagnosis not present

## 2023-11-26 DIAGNOSIS — N2581 Secondary hyperparathyroidism of renal origin: Secondary | ICD-10-CM | POA: Diagnosis not present

## 2023-11-26 DIAGNOSIS — I120 Hypertensive chronic kidney disease with stage 5 chronic kidney disease or end stage renal disease: Secondary | ICD-10-CM | POA: Diagnosis not present

## 2023-11-26 DIAGNOSIS — E559 Vitamin D deficiency, unspecified: Secondary | ICD-10-CM | POA: Diagnosis not present

## 2023-11-26 DIAGNOSIS — D631 Anemia in chronic kidney disease: Secondary | ICD-10-CM | POA: Diagnosis not present

## 2023-11-26 DIAGNOSIS — N185 Chronic kidney disease, stage 5: Secondary | ICD-10-CM | POA: Diagnosis not present

## 2023-11-30 ENCOUNTER — Other Ambulatory Visit: Payer: Self-pay | Admitting: Internal Medicine

## 2023-12-02 ENCOUNTER — Other Ambulatory Visit (HOSPITAL_COMMUNITY): Payer: Medicare HMO

## 2023-12-02 ENCOUNTER — Encounter (HOSPITAL_COMMUNITY): Payer: Medicare HMO

## 2023-12-03 DIAGNOSIS — I5021 Acute systolic (congestive) heart failure: Secondary | ICD-10-CM | POA: Diagnosis not present

## 2023-12-03 DIAGNOSIS — Z789 Other specified health status: Secondary | ICD-10-CM | POA: Diagnosis not present

## 2023-12-03 DIAGNOSIS — R531 Weakness: Secondary | ICD-10-CM | POA: Diagnosis not present

## 2023-12-03 DIAGNOSIS — Z9181 History of falling: Secondary | ICD-10-CM | POA: Diagnosis not present

## 2023-12-03 DIAGNOSIS — R5381 Other malaise: Secondary | ICD-10-CM | POA: Diagnosis not present

## 2023-12-03 DIAGNOSIS — R293 Abnormal posture: Secondary | ICD-10-CM | POA: Diagnosis not present

## 2023-12-03 DIAGNOSIS — R0602 Shortness of breath: Secondary | ICD-10-CM | POA: Diagnosis not present

## 2023-12-03 DIAGNOSIS — R2689 Other abnormalities of gait and mobility: Secondary | ICD-10-CM | POA: Diagnosis not present

## 2023-12-08 DIAGNOSIS — Z6822 Body mass index (BMI) 22.0-22.9, adult: Secondary | ICD-10-CM | POA: Diagnosis not present

## 2023-12-08 DIAGNOSIS — Z955 Presence of coronary angioplasty implant and graft: Secondary | ICD-10-CM | POA: Diagnosis not present

## 2023-12-08 DIAGNOSIS — R7989 Other specified abnormal findings of blood chemistry: Secondary | ICD-10-CM | POA: Diagnosis not present

## 2023-12-08 DIAGNOSIS — J810 Acute pulmonary edema: Secondary | ICD-10-CM | POA: Diagnosis not present

## 2023-12-08 DIAGNOSIS — D649 Anemia, unspecified: Secondary | ICD-10-CM | POA: Diagnosis not present

## 2023-12-08 DIAGNOSIS — Z794 Long term (current) use of insulin: Secondary | ICD-10-CM | POA: Diagnosis not present

## 2023-12-08 DIAGNOSIS — I132 Hypertensive heart and chronic kidney disease with heart failure and with stage 5 chronic kidney disease, or end stage renal disease: Secondary | ICD-10-CM | POA: Diagnosis not present

## 2023-12-08 DIAGNOSIS — E8770 Fluid overload, unspecified: Secondary | ICD-10-CM | POA: Diagnosis not present

## 2023-12-08 DIAGNOSIS — E1122 Type 2 diabetes mellitus with diabetic chronic kidney disease: Secondary | ICD-10-CM | POA: Diagnosis not present

## 2023-12-08 DIAGNOSIS — I214 Non-ST elevation (NSTEMI) myocardial infarction: Secondary | ICD-10-CM | POA: Diagnosis not present

## 2023-12-08 DIAGNOSIS — Z951 Presence of aortocoronary bypass graft: Secondary | ICD-10-CM | POA: Diagnosis not present

## 2023-12-08 DIAGNOSIS — L89159 Pressure ulcer of sacral region, unspecified stage: Secondary | ICD-10-CM | POA: Diagnosis not present

## 2023-12-08 DIAGNOSIS — N179 Acute kidney failure, unspecified: Secondary | ICD-10-CM | POA: Diagnosis not present

## 2023-12-08 DIAGNOSIS — Z79899 Other long term (current) drug therapy: Secondary | ICD-10-CM | POA: Diagnosis not present

## 2023-12-08 DIAGNOSIS — R079 Chest pain, unspecified: Secondary | ICD-10-CM | POA: Diagnosis not present

## 2023-12-08 DIAGNOSIS — N189 Chronic kidney disease, unspecified: Secondary | ICD-10-CM | POA: Diagnosis not present

## 2023-12-08 DIAGNOSIS — R0602 Shortness of breath: Secondary | ICD-10-CM | POA: Diagnosis not present

## 2023-12-08 DIAGNOSIS — J90 Pleural effusion, not elsewhere classified: Secondary | ICD-10-CM | POA: Diagnosis not present

## 2023-12-08 DIAGNOSIS — I509 Heart failure, unspecified: Secondary | ICD-10-CM | POA: Diagnosis not present

## 2023-12-08 DIAGNOSIS — Z9861 Coronary angioplasty status: Secondary | ICD-10-CM | POA: Diagnosis not present

## 2023-12-08 DIAGNOSIS — N185 Chronic kidney disease, stage 5: Secondary | ICD-10-CM | POA: Diagnosis not present

## 2023-12-08 DIAGNOSIS — E440 Moderate protein-calorie malnutrition: Secondary | ICD-10-CM | POA: Diagnosis not present

## 2023-12-08 DIAGNOSIS — I249 Acute ischemic heart disease, unspecified: Secondary | ICD-10-CM | POA: Diagnosis not present

## 2023-12-08 DIAGNOSIS — D631 Anemia in chronic kidney disease: Secondary | ICD-10-CM | POA: Diagnosis not present

## 2023-12-08 DIAGNOSIS — E7800 Pure hypercholesterolemia, unspecified: Secondary | ICD-10-CM | POA: Diagnosis not present

## 2023-12-08 DIAGNOSIS — N184 Chronic kidney disease, stage 4 (severe): Secondary | ICD-10-CM | POA: Diagnosis not present

## 2023-12-08 DIAGNOSIS — Z8679 Personal history of other diseases of the circulatory system: Secondary | ICD-10-CM | POA: Diagnosis not present

## 2023-12-08 DIAGNOSIS — Z7902 Long term (current) use of antithrombotics/antiplatelets: Secondary | ICD-10-CM | POA: Diagnosis not present

## 2023-12-08 DIAGNOSIS — E039 Hypothyroidism, unspecified: Secondary | ICD-10-CM | POA: Diagnosis not present

## 2023-12-08 DIAGNOSIS — K219 Gastro-esophageal reflux disease without esophagitis: Secondary | ICD-10-CM | POA: Diagnosis not present

## 2023-12-08 DIAGNOSIS — I129 Hypertensive chronic kidney disease with stage 1 through stage 4 chronic kidney disease, or unspecified chronic kidney disease: Secondary | ICD-10-CM | POA: Diagnosis not present

## 2023-12-08 DIAGNOSIS — R0902 Hypoxemia: Secondary | ICD-10-CM | POA: Diagnosis not present

## 2023-12-08 DIAGNOSIS — I119 Hypertensive heart disease without heart failure: Secondary | ICD-10-CM | POA: Diagnosis not present

## 2023-12-08 DIAGNOSIS — I5023 Acute on chronic systolic (congestive) heart failure: Secondary | ICD-10-CM | POA: Diagnosis not present

## 2023-12-08 DIAGNOSIS — Z66 Do not resuscitate: Secondary | ICD-10-CM | POA: Diagnosis not present

## 2023-12-08 DIAGNOSIS — J811 Chronic pulmonary edema: Secondary | ICD-10-CM | POA: Diagnosis not present

## 2023-12-08 DIAGNOSIS — D638 Anemia in other chronic diseases classified elsewhere: Secondary | ICD-10-CM | POA: Diagnosis not present

## 2023-12-08 DIAGNOSIS — E8722 Chronic metabolic acidosis: Secondary | ICD-10-CM | POA: Diagnosis not present

## 2023-12-08 DIAGNOSIS — E871 Hypo-osmolality and hyponatremia: Secondary | ICD-10-CM | POA: Diagnosis not present

## 2023-12-08 DIAGNOSIS — R778 Other specified abnormalities of plasma proteins: Secondary | ICD-10-CM | POA: Diagnosis not present

## 2023-12-08 DIAGNOSIS — I5020 Unspecified systolic (congestive) heart failure: Secondary | ICD-10-CM | POA: Diagnosis not present

## 2023-12-08 DIAGNOSIS — E872 Acidosis, unspecified: Secondary | ICD-10-CM | POA: Diagnosis not present

## 2023-12-08 DIAGNOSIS — I130 Hypertensive heart and chronic kidney disease with heart failure and stage 1 through stage 4 chronic kidney disease, or unspecified chronic kidney disease: Secondary | ICD-10-CM | POA: Diagnosis not present

## 2023-12-08 DIAGNOSIS — I2510 Atherosclerotic heart disease of native coronary artery without angina pectoris: Secondary | ICD-10-CM | POA: Diagnosis not present

## 2023-12-08 DIAGNOSIS — E785 Hyperlipidemia, unspecified: Secondary | ICD-10-CM | POA: Diagnosis not present

## 2023-12-08 DIAGNOSIS — R339 Retention of urine, unspecified: Secondary | ICD-10-CM | POA: Diagnosis not present

## 2023-12-08 DIAGNOSIS — Z79890 Hormone replacement therapy (postmenopausal): Secondary | ICD-10-CM | POA: Diagnosis not present

## 2023-12-11 DIAGNOSIS — R0602 Shortness of breath: Secondary | ICD-10-CM | POA: Diagnosis not present

## 2023-12-12 DIAGNOSIS — I214 Non-ST elevation (NSTEMI) myocardial infarction: Secondary | ICD-10-CM | POA: Diagnosis not present

## 2023-12-12 DIAGNOSIS — I509 Heart failure, unspecified: Secondary | ICD-10-CM | POA: Diagnosis not present

## 2023-12-15 ENCOUNTER — Telehealth: Payer: Self-pay | Admitting: *Deleted

## 2023-12-15 NOTE — Transitions of Care (Post Inpatient/ED Visit) (Signed)
   12/15/2023  Name: Ethan Mcneil MRN: [id number] DOB: [birth year]  Today's TOC FU Call Status: Today's TOC FU Call Status:: Unsuccessful Call (1st Attempt) Unsuccessful Call (1st Attempt) Date: 12/15/23  Attempted to reach the patient regarding the most recent Inpatient/ED visit.  Follow Up Plan: Additional outreach attempts will be made to reach the patient to complete the Transitions of Care (Post Inpatient/ED visit) call.   Gean Maidens BSN RN Population Health- Transition of Care Team.  [facility] [phone]

## 2023-12-16 ENCOUNTER — Telehealth: Payer: Self-pay | Admitting: *Deleted

## 2023-12-16 NOTE — Transitions of Care (Post Inpatient/ED Visit) (Signed)
   12/16/2023  Name: Ethan Mcneil MRN: [id number] DOB: 09/08/1934  Today's TOC FU Call Status: Today's TOC FU Call Status:: Successful TOC FU Call Completed TOC FU Call Complete Date: 12/16/23 Patient's Name and Date of Birth confirmed.  Transition Care Management Follow-up Telephone Call Date of Discharge: 12/14/23 Discharge Facility: Other ([facility]) Name of Other ([facility]) Discharge Facility: [facility] Primary Inpatient Discharge Diagnosis:: Non ST elevated myocardial infarction  Patient has been contacted by Hospice and have been to see the patient 12/16/2023 Patient is under Hospice at home care  Cathlean Headland BSN RN Population Health- Transition of Care Team.  [facility] [phone]

## 2023-12-23 ENCOUNTER — Other Ambulatory Visit: Payer: Self-pay | Admitting: *Deleted

## 2023-12-23 DIAGNOSIS — N186 End stage renal disease: Secondary | ICD-10-CM

## 2023-12-29 NOTE — Progress Notes (Deleted)
Patient ID: ORON WESTRUP, male   DOB: 02-Jul-1934, 89 y.o.   MRN: [id number]  Reason for Consult: No chief complaint on file.   Referred by Wanda Plump, MD  Subjective:     HPI  RAEF SPRIGG is a 89 y.o. male who presents for evaluation HD access creation.  Past Medical History: No date: Anemia No date: Arthritis No date: CAD (coronary artery disease)     Comment:  CABG 1997; grafts patent @ cath [facility] 11/09 No date: DM2 (diabetes mellitus, type 2) (HCC) No date: Gout     Comment:  after tick bite No date: History of kidney stones No date: HLD (hyperlipidemia) No date: HTN (hypertension)     Comment:  essential nos No date: Hypothyroid No date: Pancreatic lesion No date: Postsurgical aortocoronary bypass status No date: PVD (peripheral vascular disease) (HCC) No date: S/P herniorrhaphy 2012: Tick fever     Comment:  [facility] [place] No date: Unspecified hyperplasia of prostate without urinary  obstruction and other lower urinary tract symptoms (LUTS) 2015: Urolithiasis     Comment:  hx of No date: Wears dentures No date: Wears glasses  Family History  Problem Relation Age of Onset   Lung cancer Father    Coronary artery disease Mother        stent   Diabetes Brother    Prostate cancer Maternal Uncle        in his 62s   Stroke Son        GF   Throat cancer Son    Colon cancer Neg Hx    Rectal cancer Neg Hx    Stomach cancer Neg Hx    Esophageal cancer Neg Hx    Pancreatic cancer Neg Hx    Past Surgical History:  Procedure Laterality Date   BIOPSY  08/07/2020   Procedure: BIOPSY;  Surgeon: Mansouraty, Netty Starring., MD;  Location: [facility] ENDOSCOPY;  Service: Gastroenterology;;   CATARACT EXTRACTION Bilateral    CORONARY ARTERY BYPASS GRAFT     x7 vessels 1997; cath 2002 and 2009; grafts patent    CYSTOSCOPY WITH RETROGRADE PYELOGRAM, URETEROSCOPY AND STENT PLACEMENT Right 02/21/2014   Procedure: CYSTOSCOPY WITH RIGHT  RETROGRADE  PYELOGRAM,RIGHT  URETEROSCOPY WITH STONE BASKETING EXTRACTION;  Surgeon: Bjorn Pippin, MD;  Location: [facility] ORS;  Service: Urology;  Laterality: Right;   ESOPHAGOGASTRODUODENOSCOPY (EGD) WITH PROPOFOL N/A 08/07/2020   Procedure: ESOPHAGOGASTRODUODENOSCOPY (EGD) WITH PROPOFOL;  Surgeon: Meridee Score Netty Starring., MD;  Location: [facility] ENDOSCOPY;  Service: Gastroenterology;  Laterality: N/A;   EUS N/A 08/07/2020   Procedure: UPPER ENDOSCOPIC ULTRASOUND (EUS) LINEAR;  Surgeon: Lemar Lofty., MD;  Location: [facility] ENDOSCOPY;  Service: Gastroenterology;  Laterality: N/A;   FINE NEEDLE ASPIRATION N/A 08/07/2020   Procedure: FINE NEEDLE ASPIRATION (FNA) LINEAR;  Surgeon: Lemar Lofty., MD;  Location: [facility] ENDOSCOPY;  Service: Gastroenterology;  Laterality: N/A;   HERNIA REPAIR     INGUINAL HERNIA REPAIR     LEFT HEART CATH  04/16/2023   LEFT HEART CATH  04/17/2023   MULTIPLE TOOTH EXTRACTIONS     SHOULDER SURGERY     SPLENECTOMY, TOTAL N/A 10/26/2020   Procedure: SPLENECTOMY;  Surgeon: Almond Lint, MD;  Location: [facility] OR;  Service: General;  Laterality: N/A;    Short Social History:  Social History   Tobacco Use   Smoking status: Never   Smokeless tobacco: Never  Substance Use Topics   Alcohol use: No    Allergies  Allergen Reactions  Ciprofloxacin Itching   Codeine Nausea And Vomiting    Current Outpatient Medications  Medication Sig Dispense Refill   acetaminophen (TYLENOL) 500 MG tablet Take 500 mg by mouth every 4 (four) hours as needed for moderate pain.     amLODipine (NORVASC) 10 MG tablet Take 1 tablet (10 mg total) by mouth daily. 90 tablet 1   aspirin EC 81 MG tablet Take 81 mg by mouth at bedtime.     atorvastatin (LIPITOR) 80 MG tablet Take 1 tablet (80 mg total) by mouth daily. 90 tablet 3   B-D UF III MINI PEN NEEDLES 31G X 5 MM MISC USE 3 TIMES A DAY AS DIRECTED 100 each 2   Cholecalciferol (VITAMIN D3) 50 MCG (2000 UT) capsule Take 2,000 Units by mouth daily.      clopidogrel (PLAVIX) 75 MG tablet TAKE 1 TABLET BY MOUTH EVERY DAY 90 tablet 3   Continuous Blood Gluc Receiver (FREESTYLE LIBRE 2 READER) DEVI Use as instructed to check blood sugar 1 each 0   Continuous Glucose Sensor (FREESTYLE LIBRE 2 SENSOR) MISC CHANGE EVERY 14 DAYS AS DIRECTED 2 each 11   Cyanocobalamin (B-12) 1000 MCG TABS Take 1,000 mcg by mouth daily.     FREESTYLE LITE test strip CHECK BLOOD SUGAR NO MORE THAN TWICE DAILY. 200 strip 0   hydrALAZINE (APRESOLINE) 100 MG tablet Take 1 tablet (100 mg total) by mouth 3 (three) times daily. 270 tablet 3   insulin aspart (FIASP FLEXTOUCH) 100 UNIT/ML FlexTouch Pen INJECT 6-8 UNITS INTO THE SKIN WITH BREAKFAST, WITH LUNCH, AND WITH EVENING MEAL. 15 mL 1   Insulin Glargine (BASAGLAR KWIKPEN) 100 UNIT/ML Inject 8 Units into the skin at bedtime. 15 mL 11   isosorbide mononitrate (IMDUR) 120 MG 24 hr tablet Take 1 tablet (120 mg total) by mouth daily. 90 tablet 2   levothyroxine (SYNTHROID) 150 MCG tablet Take 1 tablet (150 mcg total) by mouth daily before breakfast. 90 tablet 1   metoprolol succinate (TOPROL-XL) 25 MG 24 hr tablet TAKE 1 TABLET (25 MG TOTAL) BY MOUTH DAILY. 90 tablet 1   nitroGLYCERIN (NITROSTAT) 0.4 MG SL tablet PLACE 1 TABLET UNDER THE TONGUE EVERY 5 MINUTES X 3 DOSES AS NEEDED FOR CHEST PAIN. (Patient not taking: Reported on 11/21/2023) 25 tablet 2   Pancrelipase, Lip-Prot-Amyl, (ZENPEP) 25000-79000 units CPEP Take 2 capsules by mouth with every meal and snacks 540 capsule 1   pantoprazole (PROTONIX) 40 MG tablet TAKE 1 TABLET BY MOUTH EVERY DAY 90 tablet 1   sodium bicarbonate 650 MG tablet Take 650 mg by mouth 2 (two) times daily.     torsemide (DEMADEX) 20 MG tablet TAKE 3 TABLETS BY MOUTH EVERY DAY 270 tablet 0   No current facility-administered medications for this visit.    REVIEW OF SYSTEMS  Positive for ***  All other systems were reviewed and are negative     Objective:  Objective   There were no vitals filed  for this visit. There is no height or weight on file to calculate BMI.  Physical Exam General: no acute distress Cardiac: hemodynamically stable Pulm: normal work of breathing Neuro: alert, no focal deficit Extremities: *** Vascular:   Right: palpable brachial, radial  Left: palpable brachial, radial   Data: UE arterial duplex ***    Vein mapping ***  Note from nephrologist reviewed ***      Assessment/Plan:     MONTRELL CESSNA is a 89 y.o. male with {CKDACcess:31998} who presents to  discuss permanent access creation.  Dominant hand: {RIGHT/LEFT:20294} Previous access surgeries: *** Previous catheters: {RIGHT/LEFT:21944} IJ Currently dialyzing via *** on *** Other arm surgeries/injuries: ***  The vein mapping suggests there is *** a suitable *** and we discussed that they are a candidate for ***  The risks an benefits including of access creation were reviewed including: need for additional procedures, need for additional creations, steal, ischemia monomelic neuropathy, failure of access, and bleeding. The patient expressed understand and is willing to proceed.  I explained that I will perform intraoperative vein mapping to confirm the above findings and will determine the most appropriate access to create but with a preoperative plan for {RIGHT/LEFT:20294} arm ***.     Daria Pastures MD [facility]

## 2024-01-02 ENCOUNTER — Ambulatory Visit (HOSPITAL_COMMUNITY): Payer: Medicare HMO

## 2024-01-02 ENCOUNTER — Encounter: Payer: Medicare HMO | Admitting: Vascular Surgery

## 2024-02-02 ENCOUNTER — Ambulatory Visit: Payer: Medicare HMO | Admitting: Internal Medicine

## 2024-02-14 DEATH — deceased

## 2024-02-27 ENCOUNTER — Ambulatory Visit: Payer: Medicare HMO | Admitting: Cardiology

## 2024-03-10 ENCOUNTER — Ambulatory Visit: Payer: Medicare HMO | Admitting: Internal Medicine

## 2024-04-19 IMAGING — CR DG CHEST 2V
2 series · 2 of 2 positions shown · non-contrast
Comparison: Radiograph December 27, 2021

CLINICAL DATA: Shortness of breath, cough and fatigue.

EXAM:
CHEST - 2 VIEW

[w chest pa]
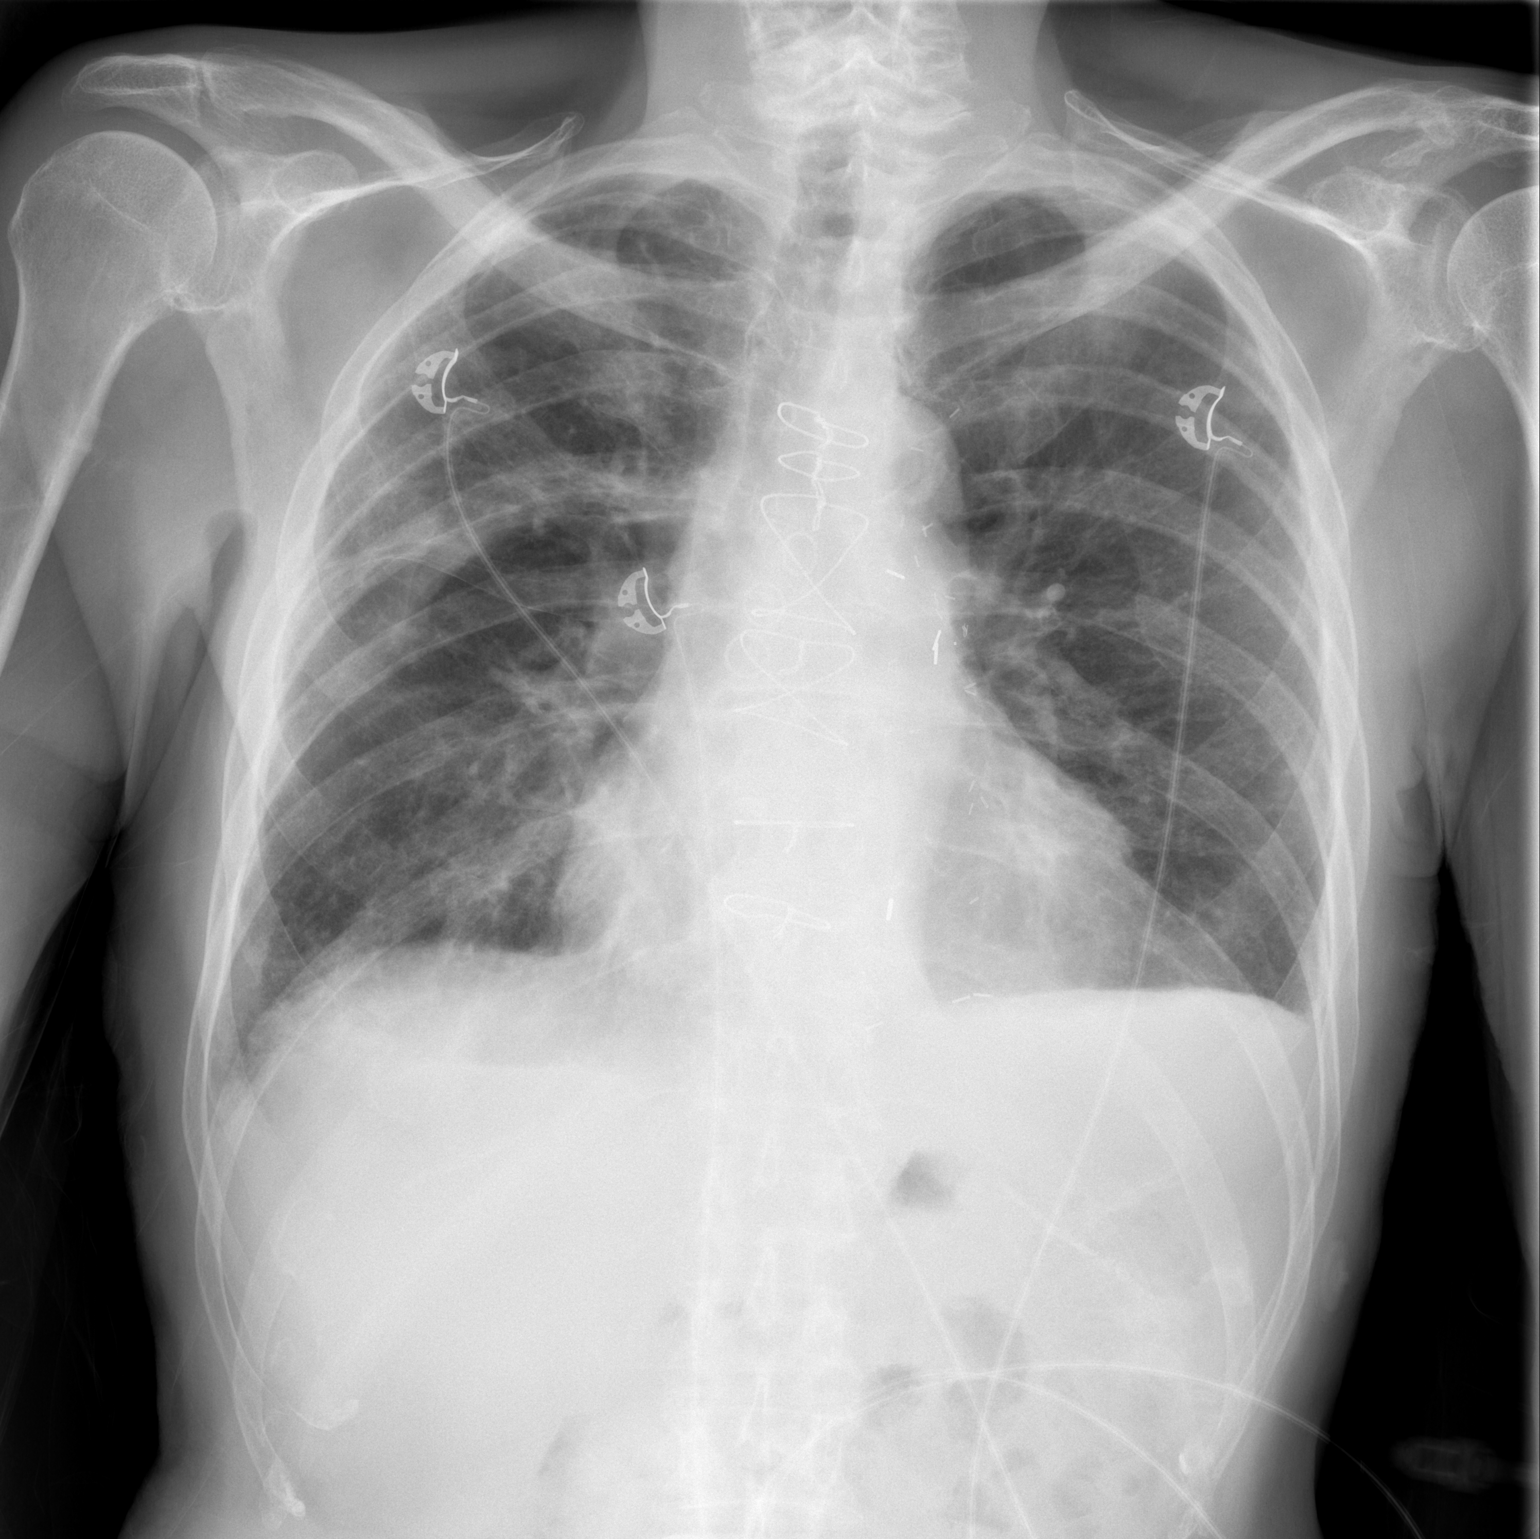

[w chest lat]
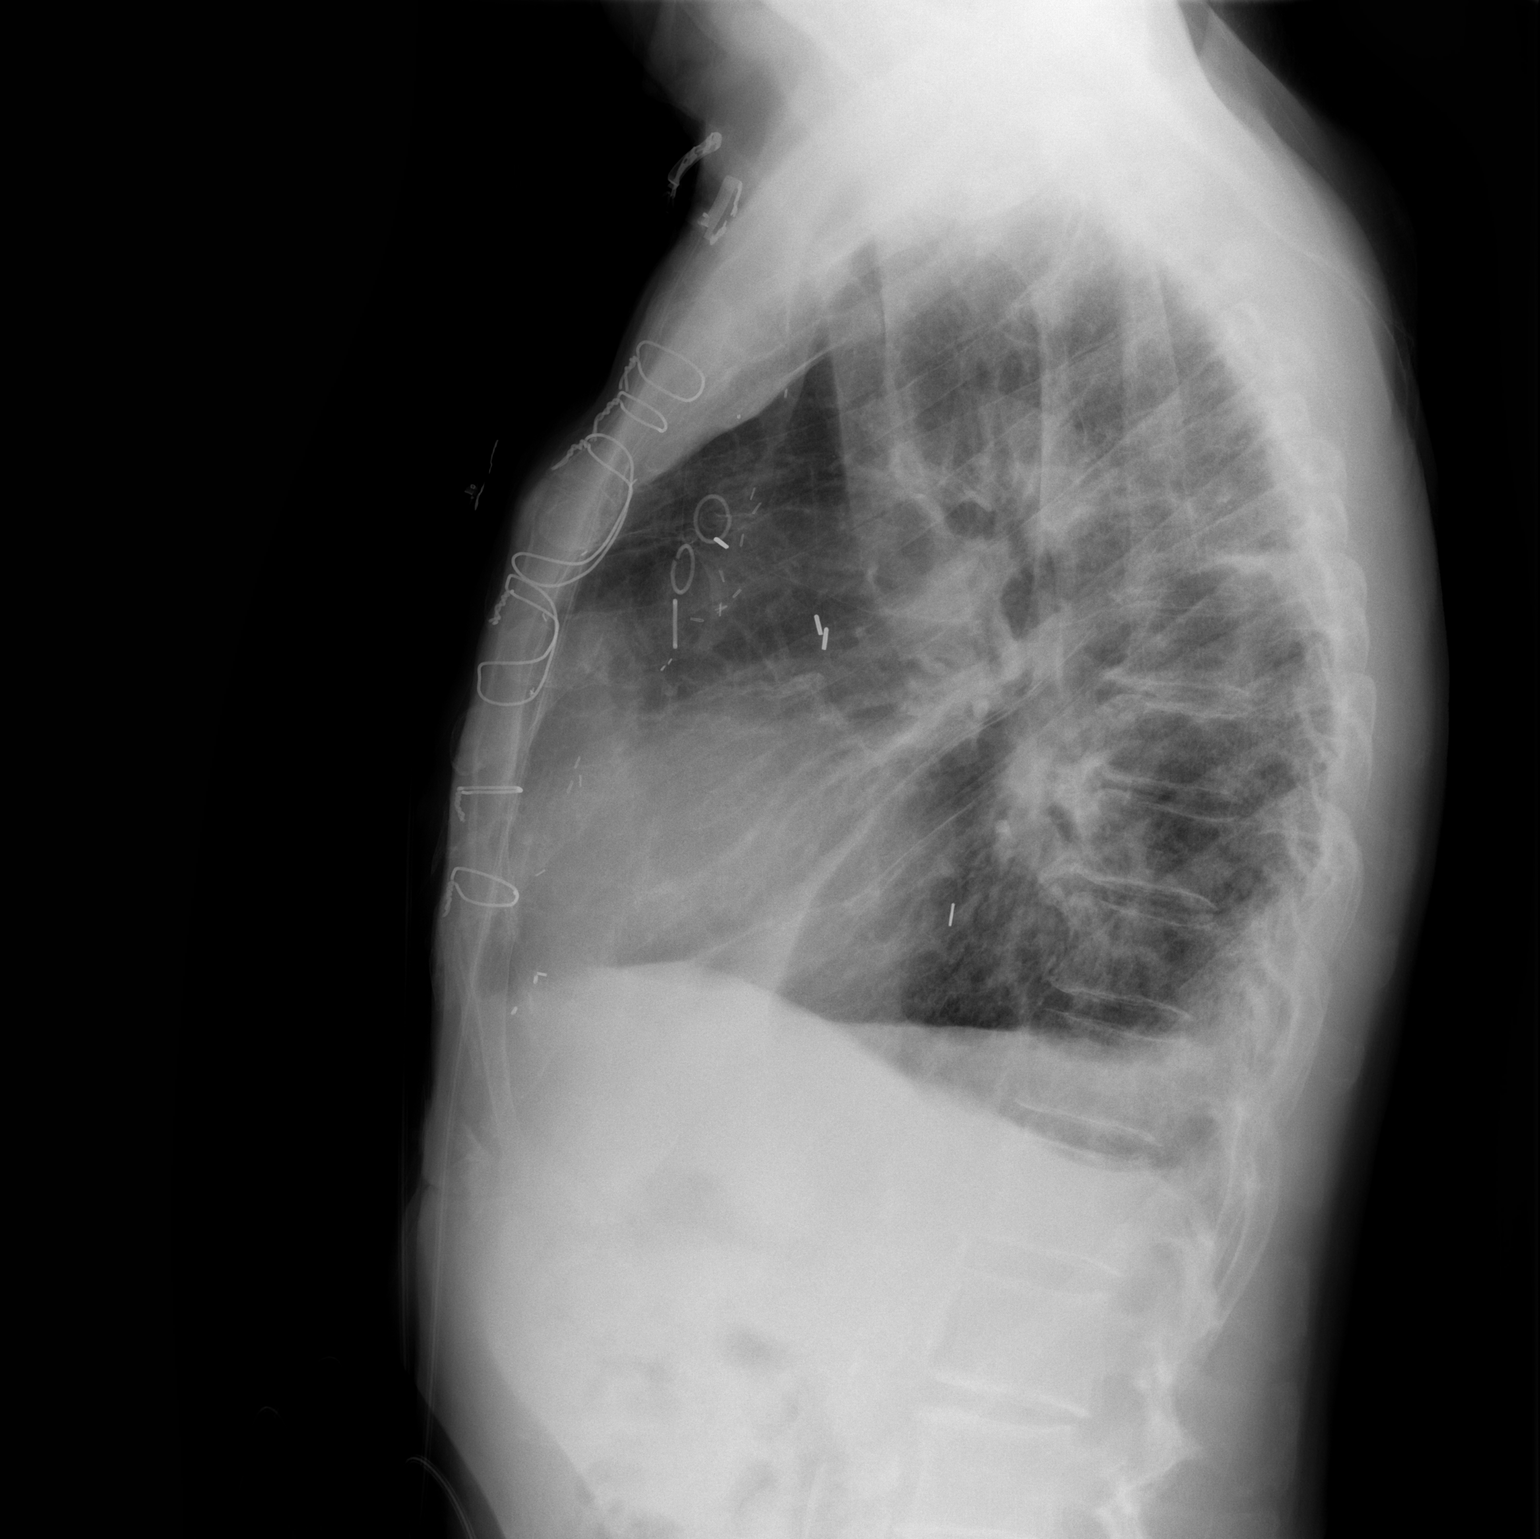

[2 of 2 positions shown; findings below may reference images not displayed]

FINDINGS: Enlarged cardiac silhouette with central vascular prominence. Patchy
opacities in the right lung. Small left pleural effusion. No visible
pneumothorax. Prior median sternotomy and CABG. Thoracic
spondylosis.
IMPRESSION: Cardiomegaly with a small left pleural effusion and patchy opacities
in the right lung may reflect asymmetric edema or infection.
